# Patient Record
Sex: Female | Born: 1950 | Race: White | Hispanic: No | Smoking: Current every day smoker
Health system: Southern US, Community
[De-identification: ages and names within clinical notes are randomized; demographics above are authoritative.]

## PROBLEM LIST (undated history)

## (undated) DIAGNOSIS — E039 Hypothyroidism, unspecified: Secondary | ICD-10-CM

## (undated) DIAGNOSIS — Z87898 Personal history of other specified conditions: Secondary | ICD-10-CM

## (undated) DIAGNOSIS — Z923 Personal history of irradiation: Secondary | ICD-10-CM

## (undated) DIAGNOSIS — J449 Chronic obstructive pulmonary disease, unspecified: Secondary | ICD-10-CM

## (undated) DIAGNOSIS — I480 Paroxysmal atrial fibrillation: Secondary | ICD-10-CM

## (undated) DIAGNOSIS — G629 Polyneuropathy, unspecified: Secondary | ICD-10-CM

## (undated) DIAGNOSIS — C801 Malignant (primary) neoplasm, unspecified: Secondary | ICD-10-CM

## (undated) DIAGNOSIS — R062 Wheezing: Secondary | ICD-10-CM

## (undated) DIAGNOSIS — I2510 Atherosclerotic heart disease of native coronary artery without angina pectoris: Secondary | ICD-10-CM

## (undated) DIAGNOSIS — G2581 Restless legs syndrome: Secondary | ICD-10-CM

## (undated) DIAGNOSIS — R52 Pain, unspecified: Secondary | ICD-10-CM

## (undated) DIAGNOSIS — R0902 Hypoxemia: Secondary | ICD-10-CM

## (undated) DIAGNOSIS — R0600 Dyspnea, unspecified: Secondary | ICD-10-CM

## (undated) DIAGNOSIS — K219 Gastro-esophageal reflux disease without esophagitis: Secondary | ICD-10-CM

## (undated) DIAGNOSIS — F419 Anxiety disorder, unspecified: Secondary | ICD-10-CM

## (undated) DIAGNOSIS — I5030 Unspecified diastolic (congestive) heart failure: Secondary | ICD-10-CM

## (undated) DIAGNOSIS — I350 Nonrheumatic aortic (valve) stenosis: Secondary | ICD-10-CM

## (undated) DIAGNOSIS — G4730 Sleep apnea, unspecified: Secondary | ICD-10-CM

## (undated) DIAGNOSIS — I739 Peripheral vascular disease, unspecified: Secondary | ICD-10-CM

## (undated) DIAGNOSIS — I509 Heart failure, unspecified: Secondary | ICD-10-CM

## (undated) DIAGNOSIS — Z8719 Personal history of other diseases of the digestive system: Secondary | ICD-10-CM

## (undated) DIAGNOSIS — J45909 Unspecified asthma, uncomplicated: Secondary | ICD-10-CM

## (undated) DIAGNOSIS — E119 Type 2 diabetes mellitus without complications: Secondary | ICD-10-CM

## (undated) HISTORY — DX: Nonrheumatic aortic (valve) stenosis: I35.0

## (undated) HISTORY — DX: Paroxysmal atrial fibrillation: I48.0

## (undated) HISTORY — DX: Atherosclerotic heart disease of native coronary artery without angina pectoris: I25.10

## (undated) HISTORY — PX: FOOT SURGERY: SHX648

## (undated) HISTORY — PX: CYST EXCISION: SHX5701

## (undated) HISTORY — PX: TUBAL LIGATION: SHX77

## (undated) HISTORY — PX: BREAST SURGERY: SHX581

## (undated) HISTORY — DX: Unspecified diastolic (congestive) heart failure: I50.30

## (undated) NOTE — *Deleted (*Deleted)
PROGRESS NOTE   Kristin [name]  ZOX:[id number] DOB: 11-23-1950 DOA: 03/28/2020 PCP: Evelene Croon, MD  Brief Narrative:  33 year old white female COPD at baseline on 3 L of oxygen Continued tobacco abuse DM TY 2 OSA on CPAP HLD HFpEF with moderate aortic stenosis Patient came to [facility] developed central breast pain radiation to left arm flank Called EMS-nonspecific ST changes on EKG Troponin 25->240--- patient declined stress testing Cardiology consulted-cardiac cath performed 11/17 showed mild to moderate nonobstructive CAD with stenosis proximal left circumflex-right heart cath showed moderately elevated left-sided pressures and moderate pulmonary hypertension and patient was recommended medical therapy and outpatient TAVR eval  Assessment & Plan:   Principal Problem:   Chest pain Active Problems:   Chronic diastolic (congestive) heart failure (HCC)   Type 2 diabetes mellitus without complication (HCC)   COPD (chronic obstructive pulmonary disease) (HCC)   Obesity, Class III, BMI 40-49.9 (morbid obesity) (HCC)   Chronic respiratory failure with hypoxia (HCC)   OSA (obstructive sleep apnea)   Essential hypertension   Hypothyroidism   Elevated troponin   Severe aortic stenosis   Demand ischemia (HCC)   Acute on chronic heart failure with preserved ejection fraction (HFpEF) (HCC)   1. NSTEMI 2. Acute superimposed on chronic HFpEF-EF this admission 55-60% grade 1 diastolic dysfunction 3. Severe aortic stenosis valve area 1.11 cm gradient 31 4. Prerenal azotemia secondary to diuresis 5. DM TY 2 on oral medication 6. Moderate to severe COPD on chronic oxygen 3 L at home 7. Hypothyroidism 8. Reflux 9. Chronic constipation 10. HLD 11. Depression 12.   DVT prophylaxis:  Code Status:  Family Communication:  Disposition:  Status is: Inpatient  {Inpatient:23812}  Dispo: The patient is from: {From:23814}              Anticipated d/c is to: {To:23815}               Anticipated d/c date is: {Days:23816}              Patient currently {Medically stable:23817}       Consultants:   ***  Procedures: ***  Antimicrobials: ***    Subjective: ***  Objective: Vitals:   04/01/20 1211 04/01/20 1609 04/01/20 2044 04/02/20 0431  BP: 131/82 (!) 130/54 (!) 120/40 134/63  Pulse: 73 72 62 62  Resp: 17 20 20 18   Temp: 98.3 F (36.8 C) 98.3 F (36.8 C) 98.5 F (36.9 C) (!) 97.5 F (36.4 C)  TempSrc: Oral Oral Oral Oral  SpO2: 94% 96% 96% 96%  Weight:    106.3 kg  Height:        Intake/Output Summary (Last 24 hours) at 04/02/2020 0740 Last data filed at 04/02/2020 0500 Gross per 24 hour  Intake 1200 ml  Output 2000 ml  Net -800 ml   Filed Weights   03/31/20 1418 04/01/20 0516 04/02/20 0431  Weight: 107.2 kg 106.2 kg 106.3 kg    Examination:    Data Reviewed: I have personally reviewed following labs and imaging studies BUNs/creatinine up from 16/0.9-->34/1.06 Chloride 93 CO2 35 Hemoglobin 13.9 white count 8.9 platelet 179  Radiology Studies: CARDIAC CATHETERIZATION  Result Date: 03/31/2020  Prox RCA lesion is 30% stenosed.  Mid RCA lesion is 40% stenosed.  RPDA lesion is 30% stenosed.  Prox Cx lesion is 60% stenosed.  Mid LAD lesion is 20% stenosed.  Mid Cx to Dist Cx lesion is 20% stenosed.  1.  Mild to moderate nonobstructive coronary artery disease.  Worst stenosis  is 60% in the proximal left circumflex.  No evidence of obstructive disease.  Coronary arteries are overall moderately calcified. 2.  Right heart catheterization showed moderately elevated left-sided filling pressures, moderate pulmonary hypertension and moderately reduced cardiac output. 3.  Severe aortic stenosis with mean gradient of 31 mmHg and calculated valve area of 0.7 cm. Recommendations: The patient is significantly volume overloaded.  I switched furosemide to intravenous 20 mg twice daily.  I increased carvedilol for better blood pressure  control. Recommend medical therapy for nonobstructive coronary artery disease. Recommend outpatient TAVR evaluation.   US Carotid Bilateral  Result Date: 04/01/2020 CLINICAL DATA:  12 year old female with preoperative study EXAM: BILATERAL CAROTID DUPLEX ULTRASOUND TECHNIQUE: Wallace Cullens scale imaging, color Doppler and duplex ultrasound were performed of bilateral carotid and vertebral arteries in the neck. COMPARISON:  None. FINDINGS: Criteria: Quantification of carotid stenosis is based on velocity parameters that correlate the residual internal carotid diameter with NASCET-based stenosis levels, using the diameter of the distal internal carotid lumen as the denominator for stenosis measurement. The following velocity measurements were obtained: RIGHT ICA:  Systolic 81 cm/sec, Diastolic 19 cm/sec CCA:  70 cm/sec SYSTOLIC ICA/CCA RATIO:  1.2 ECA:  52 cm/sec LEFT ICA:  Systolic 104 cm/sec, Diastolic 23 cm/sec CCA:  91 cm/sec SYSTOLIC ICA/CCA RATIO:  1.1 ECA:  73 cm/sec Right Brachial SBP: Not acquired Left Brachial SBP: Not acquired RIGHT CAROTID ARTERY: No significant calcifications of the right common carotid artery. Intermediate waveform maintained. Heterogeneous and partially calcified plaque at the right carotid bifurcation. No significant lumen shadowing. Low resistance waveform of the right ICA. No significant tortuosity. RIGHT VERTEBRAL ARTERY: Antegrade flow with low resistance waveform. LEFT CAROTID ARTERY: No significant calcifications of the left common carotid artery. Intermediate waveform maintained. Heterogeneous and partially calcified plaque at the left carotid bifurcation without significant lumen shadowing. Low resistance waveform of the left ICA. No significant tortuosity. LEFT VERTEBRAL ARTERY:  Antegrade flow with low resistance waveform. IMPRESSION: Color duplex indicates minimal heterogeneous and calcified plaque, with no hemodynamically significant stenosis by duplex criteria in the  extracranial cerebrovascular circulation. Signed, Yvone Neu. Reyne Dumas, RPVI [facility] Radiology Electronically Signed   By: Gilmer Mor D.O.   On: 04/01/2020 10:59     Scheduled Meds: . aspirin EC  81 mg Oral Daily  . atorvastatin  80 mg Oral q1800  . carvedilol  12.5 mg Oral BID WC  . citalopram  40 mg Oral Daily  . enoxaparin (LOVENOX) injection  0.5 mg/kg Subcutaneous Q24H  . fenofibrate  160 mg Oral Daily  . furosemide  40 mg Intravenous BID  . gabapentin  600 mg Oral BID  . insulin aspart  0-20 Units Subcutaneous TID WC  . insulin aspart  0-5 Units Subcutaneous QHS  . levothyroxine  75 mcg Oral QAC breakfast  . losartan  25 mg Oral Daily  . melatonin  5 mg Oral QHS  . pantoprazole  40 mg Oral Daily  . polyethylene glycol  17 g Oral Daily  . predniSONE  40 mg Oral Q breakfast  . sodium chloride flush  3 mL Intravenous Q12H  . sodium chloride flush  3 mL Intravenous Q12H  . tiotropium  1 capsule Inhalation Daily   Continuous Infusions: . sodium chloride       LOS: 4 days    Time spent: ***  Rhetta Mura, MD [facility] To contact the attending provider between 7A-7P or the covering provider during after hours 7P-7A, please  log into the web site [email or web address] and access using universal [place] password for that web site. If you do not have the password, please call the hospital operator.  04/02/2020, 7:40 AM

---

## 2004-01-15 ENCOUNTER — Emergency Department (HOSPITAL_COMMUNITY): Admission: EM | Admit: 2004-01-15 | Discharge: 2004-01-15 | Payer: Self-pay | Admitting: *Deleted

## 2004-07-17 ENCOUNTER — Emergency Department: Payer: Self-pay | Admitting: Emergency Medicine

## 2004-10-05 ENCOUNTER — Emergency Department: Payer: Self-pay | Admitting: Emergency Medicine

## 2004-10-10 ENCOUNTER — Emergency Department: Payer: Self-pay | Admitting: Emergency Medicine

## 2005-01-10 ENCOUNTER — Emergency Department: Payer: Self-pay | Admitting: Emergency Medicine

## 2005-03-19 ENCOUNTER — Ambulatory Visit: Payer: Self-pay | Admitting: Cardiovascular Disease

## 2018-02-28 ENCOUNTER — Encounter: Payer: Self-pay | Admitting: *Deleted

## 2018-03-02 ENCOUNTER — Ambulatory Visit: Payer: Medicare Other | Admitting: Certified Registered Nurse Anesthetist

## 2018-03-02 ENCOUNTER — Other Ambulatory Visit: Payer: Self-pay

## 2018-03-02 ENCOUNTER — Ambulatory Visit
Admission: RE | Admit: 2018-03-02 | Discharge: 2018-03-02 | Disposition: A | Discharge status: Home / Self Care | Payer: Medicare Other | Source: Ambulatory Visit | Attending: Ophthalmology | Admitting: Ophthalmology

## 2018-03-02 ENCOUNTER — Encounter
Admission: RE | Disposition: A | Discharge status: Home / Self Care | Payer: Self-pay | Source: Ambulatory Visit | Attending: Ophthalmology

## 2018-03-02 ENCOUNTER — Encounter: Payer: Self-pay | Admitting: *Deleted

## 2018-03-02 DIAGNOSIS — Z79899 Other long term (current) drug therapy: Secondary | ICD-10-CM | POA: Diagnosis not present

## 2018-03-02 DIAGNOSIS — E1136 Type 2 diabetes mellitus with diabetic cataract: Secondary | ICD-10-CM | POA: Diagnosis not present

## 2018-03-02 DIAGNOSIS — Z7984 Long term (current) use of oral hypoglycemic drugs: Secondary | ICD-10-CM | POA: Diagnosis not present

## 2018-03-02 DIAGNOSIS — F419 Anxiety disorder, unspecified: Secondary | ICD-10-CM | POA: Insufficient documentation

## 2018-03-02 DIAGNOSIS — K219 Gastro-esophageal reflux disease without esophagitis: Secondary | ICD-10-CM | POA: Insufficient documentation

## 2018-03-02 DIAGNOSIS — Z7982 Long term (current) use of aspirin: Secondary | ICD-10-CM | POA: Insufficient documentation

## 2018-03-02 DIAGNOSIS — Z888 Allergy status to other drugs, medicaments and biological substances status: Secondary | ICD-10-CM | POA: Diagnosis not present

## 2018-03-02 DIAGNOSIS — E039 Hypothyroidism, unspecified: Secondary | ICD-10-CM | POA: Insufficient documentation

## 2018-03-02 DIAGNOSIS — J449 Chronic obstructive pulmonary disease, unspecified: Secondary | ICD-10-CM | POA: Insufficient documentation

## 2018-03-02 DIAGNOSIS — E1151 Type 2 diabetes mellitus with diabetic peripheral angiopathy without gangrene: Secondary | ICD-10-CM | POA: Diagnosis not present

## 2018-03-02 DIAGNOSIS — I509 Heart failure, unspecified: Secondary | ICD-10-CM | POA: Diagnosis not present

## 2018-03-02 DIAGNOSIS — Z9981 Dependence on supplemental oxygen: Secondary | ICD-10-CM | POA: Diagnosis not present

## 2018-03-02 DIAGNOSIS — Z6841 Body Mass Index (BMI) 40.0 and over, adult: Secondary | ICD-10-CM | POA: Insufficient documentation

## 2018-03-02 DIAGNOSIS — G2581 Restless legs syndrome: Secondary | ICD-10-CM | POA: Diagnosis not present

## 2018-03-02 DIAGNOSIS — E7800 Pure hypercholesterolemia, unspecified: Secondary | ICD-10-CM | POA: Diagnosis not present

## 2018-03-02 DIAGNOSIS — E669 Obesity, unspecified: Secondary | ICD-10-CM | POA: Diagnosis not present

## 2018-03-02 DIAGNOSIS — M810 Age-related osteoporosis without current pathological fracture: Secondary | ICD-10-CM | POA: Insufficient documentation

## 2018-03-02 DIAGNOSIS — H2511 Age-related nuclear cataract, right eye: Secondary | ICD-10-CM | POA: Insufficient documentation

## 2018-03-02 HISTORY — DX: Pain, unspecified: R52

## 2018-03-02 HISTORY — DX: Hypothyroidism, unspecified: E03.9

## 2018-03-02 HISTORY — DX: Dyspnea, unspecified: R06.00

## 2018-03-02 HISTORY — DX: Wheezing: R06.2

## 2018-03-02 HISTORY — DX: Chronic obstructive pulmonary disease, unspecified: J44.9

## 2018-03-02 HISTORY — DX: Personal history of other diseases of the digestive system: Z87.19

## 2018-03-02 HISTORY — DX: Polyneuropathy, unspecified: G62.9

## 2018-03-02 HISTORY — DX: Unspecified asthma, uncomplicated: J45.909

## 2018-03-02 HISTORY — DX: Peripheral vascular disease, unspecified: I73.9

## 2018-03-02 HISTORY — DX: Restless legs syndrome: G25.81

## 2018-03-02 HISTORY — DX: Anxiety disorder, unspecified: F41.9

## 2018-03-02 HISTORY — DX: Hypoxemia: R09.02

## 2018-03-02 HISTORY — PX: CATARACT EXTRACTION W/PHACO: SHX586

## 2018-03-02 HISTORY — DX: Type 2 diabetes mellitus without complications: E11.9

## 2018-03-02 HISTORY — DX: Gastro-esophageal reflux disease without esophagitis: K21.9

## 2018-03-02 HISTORY — DX: Heart failure, unspecified: I50.9

## 2018-03-02 HISTORY — DX: Personal history of other specified conditions: Z87.898

## 2018-03-02 HISTORY — DX: Sleep apnea, unspecified: G47.30

## 2018-03-02 LAB — GLUCOSE, CAPILLARY: Glucose-Capillary: 123 mg/dL — ABNORMAL HIGH (ref 70–99)

## 2018-03-02 SURGERY — PHACOEMULSIFICATION, CATARACT, WITH IOL INSERTION
Anesthesia: Monitor Anesthesia Care | Site: Eye | Laterality: Right

## 2018-03-02 MED ORDER — TETRACAINE HCL 0.5 % OP SOLN
1.0000 [drp] | OPHTHALMIC | Status: AC | PRN
  Administered 2018-03-02 (×3): 1 [drp] via OPHTHALMIC

## 2018-03-02 MED ORDER — LIDOCAINE HCL (PF) 4 % IJ SOLN
INTRAMUSCULAR | Status: AC
  Filled 2018-03-02: qty 5

## 2018-03-02 MED ORDER — ARMC OPHTHALMIC DILATING DROPS
1.0000 "application " | OPHTHALMIC | Status: AC
  Administered 2018-03-02 (×3): 1 via OPHTHALMIC

## 2018-03-02 MED ORDER — TETRACAINE HCL 0.5 % OP SOLN
OPHTHALMIC | Status: DC | PRN
  Administered 2018-03-02: 2 [drp] via OPHTHALMIC

## 2018-03-02 MED ORDER — NA HYALUR & NA CHOND-NA HYALUR 0.55-0.5 ML IO KIT
PACK | INTRAOCULAR | Status: AC
  Filled 2018-03-02: qty 1.05

## 2018-03-02 MED ORDER — POVIDONE-IODINE 5 % OP SOLN
OPHTHALMIC | Status: DC | PRN
  Administered 2018-03-02: 1 via OPHTHALMIC

## 2018-03-02 MED ORDER — MOXIFLOXACIN HCL 0.5 % OP SOLN: 1.0000 [drp] | OPHTHALMIC | Status: DC | PRN

## 2018-03-02 MED ORDER — LIDOCAINE HCL (PF) 4 % IJ SOLN
INTRAOCULAR | Status: DC | PRN
  Administered 2018-03-02: 2 mL via OPHTHALMIC

## 2018-03-02 MED ORDER — TRYPAN BLUE 0.06 % OP SOLN
OPHTHALMIC | Status: DC | PRN
  Administered 2018-03-02: .5 mL via INTRAOCULAR

## 2018-03-02 MED ORDER — EPINEPHRINE PF 1 MG/ML IJ SOLN
INTRAOCULAR | Status: DC | PRN
  Administered 2018-03-02: 1 mL via OPHTHALMIC

## 2018-03-02 MED ORDER — TETRACAINE HCL 0.5 % OP SOLN
OPHTHALMIC | Status: AC
  Administered 2018-03-02: 1 [drp] via OPHTHALMIC
  Filled 2018-03-02: qty 4

## 2018-03-02 MED ORDER — MOXIFLOXACIN HCL 0.5 % OP SOLN
OPHTHALMIC | Status: DC | PRN
  Administered 2018-03-02: .2 mL via OPHTHALMIC

## 2018-03-02 MED ORDER — POVIDONE-IODINE 5 % OP SOLN
OPHTHALMIC | Status: AC
  Filled 2018-03-02: qty 30

## 2018-03-02 MED ORDER — EPINEPHRINE PF 1 MG/ML IJ SOLN
INTRAMUSCULAR | Status: AC
  Filled 2018-03-02: qty 1

## 2018-03-02 MED ORDER — ARMC OPHTHALMIC DILATING DROPS
OPHTHALMIC | Status: AC
  Administered 2018-03-02: 1 via OPHTHALMIC
  Filled 2018-03-02: qty 0.5

## 2018-03-02 MED ORDER — MIDAZOLAM HCL 2 MG/2ML IJ SOLN
INTRAMUSCULAR | Status: DC | PRN
  Administered 2018-03-02: 0.5 mg via INTRAVENOUS
  Administered 2018-03-02: 1 mg via INTRAVENOUS
  Administered 2018-03-02: 0.5 mg via INTRAVENOUS

## 2018-03-02 MED ORDER — IPRATROPIUM-ALBUTEROL 0.5-2.5 (3) MG/3ML IN SOLN
3.0000 mL | Freq: Once | RESPIRATORY_TRACT | Status: AC
  Administered 2018-03-02: 3 mL via RESPIRATORY_TRACT

## 2018-03-02 MED ORDER — TRYPAN BLUE 0.06 % OP SOLN
OPHTHALMIC | Status: AC
  Filled 2018-03-02: qty 0.5

## 2018-03-02 MED ORDER — SODIUM CHLORIDE 0.9 % IV SOLN
INTRAVENOUS | Status: DC
  Administered 2018-03-02: 09:00:00 via INTRAVENOUS

## 2018-03-02 MED ORDER — MIDAZOLAM HCL 2 MG/2ML IJ SOLN
INTRAMUSCULAR | Status: AC
  Filled 2018-03-02: qty 2

## 2018-03-02 MED ORDER — MOXIFLOXACIN HCL 0.5 % OP SOLN
OPHTHALMIC | Status: AC
  Filled 2018-03-02: qty 3

## 2018-03-02 MED ORDER — IPRATROPIUM-ALBUTEROL 0.5-2.5 (3) MG/3ML IN SOLN
RESPIRATORY_TRACT | Status: AC
  Administered 2018-03-02: 3 mL via RESPIRATORY_TRACT
  Filled 2018-03-02: qty 3

## 2018-03-02 MED ORDER — IPRATROPIUM-ALBUTEROL 0.5-2.5 (3) MG/3ML IN SOLN: 3.0000 mL | Freq: Four times a day (QID) | RESPIRATORY_TRACT | Status: DC

## 2018-03-02 SURGICAL SUPPLY — 19 items
DISSECTOR HYDRO NUCLEUS 50X22 (MISCELLANEOUS) ×6 IMPLANT
GLOVE BIOGEL M 6.5 STRL (GLOVE) ×2 IMPLANT
GOWN STRL REUS W/ TWL LRG LVL3 (GOWN DISPOSABLE) ×1 IMPLANT
GOWN STRL REUS W/ TWL XL LVL3 (GOWN DISPOSABLE) ×1 IMPLANT
GOWN STRL REUS W/TWL LRG LVL3 (GOWN DISPOSABLE) ×1
GOWN STRL REUS W/TWL XL LVL3 (GOWN DISPOSABLE) ×1
KNIFE 45D UP 2.3 (MISCELLANEOUS) ×2 IMPLANT
LABEL CATARACT MEDS ST (LABEL) ×2 IMPLANT
LENS IOL ACRSF IQ ULTRA 23.0 (Intraocular Lens) IMPLANT
LENS IOL ACRYSOF IQ 23.0 (Intraocular Lens) ×2 IMPLANT
PACK CATARACT (MISCELLANEOUS) ×2 IMPLANT
PACK CATARACT KING (MISCELLANEOUS) ×2 IMPLANT
PACK EYE AFTER SURG (MISCELLANEOUS) ×2 IMPLANT
SOL BSS BAG (MISCELLANEOUS) ×2
SOLUTION BSS BAG (MISCELLANEOUS) ×1 IMPLANT
SPEAR PVA EYE SURG (MISCELLANEOUS) ×1 IMPLANT
SYR 5ML LL (SYRINGE) ×2 IMPLANT
WATER STERILE IRR 250ML POUR (IV SOLUTION) ×2 IMPLANT
WIPE NON LINTING 3.25X3.25 (MISCELLANEOUS) ×2 IMPLANT

## 2018-03-02 NOTE — Transfer of Care (Signed)
Immediate Anesthesia Transfer of Care Note  Patient: Kristin [name]  Procedure(s) Performed: CATARACT EXTRACTION PHACO AND INTRAOCULAR LENS PLACEMENT (IOC) (Right Eye)  Patient Location: PACU  Anesthesia Type:MAC  Level of Consciousness: awake, alert  and oriented  Airway & Oxygen Therapy: Patient Spontanous Breathing  Post-op Assessment: Report given to RN and Post -op Vital signs reviewed and stable  Post vital signs: Reviewed and stable  Last Vitals:  Vitals Value Taken Time  BP 140/67   Temp    Pulse 88   Resp 24   SpO2 95     Last Pain:  Vitals:   03/02/18 0832  TempSrc: Tympanic  PainSc: 6          Complications: No apparent anesthesia complications

## 2018-03-02 NOTE — H&P (Signed)
  The H&P has been reviewed, and I agree with its findins. There have been no interval changes.  Marchia Meiers MD

## 2018-03-02 NOTE — Anesthesia Preprocedure Evaluation (Signed)
Anesthesia Evaluation  Patient identified by MRN, date of birth, ID band Patient awake    Reviewed: Allergy & Precautions, NPO status , Patient's Chart, lab work & pertinent test results  History of Anesthesia Complications Negative for: history of anesthetic complications  Airway Mallampati: III  TM Distance: >3 FB Neck ROM: Full    Dental  (+) Edentulous Upper, Edentulous Lower   Pulmonary asthma , sleep apnea , COPD,  COPD inhaler and oxygen dependent, Current Smoker,    breath sounds clear to auscultation- rhonchi (-) wheezing      Cardiovascular +CHF and + Orthopnea  (-) CAD, (-) Past MI, (-) Cardiac Stents and (-) CABG  Rhythm:Regular Rate:Normal - Systolic murmurs and - Diastolic murmurs    Neuro/Psych Anxiety negative neurological ROS     GI/Hepatic Neg liver ROS, hiatal hernia, GERD  ,  Endo/Other  diabetes, Oral Hypoglycemic AgentsHypothyroidism   Renal/GU negative Renal ROS     Musculoskeletal negative musculoskeletal ROS (+)   Abdominal (+) + obese,   Peds  Hematology negative hematology ROS (+)   Anesthesia Other Findings Past Medical History: No date: Anxiety No date: Asthma No date: CHF (congestive heart failure) (HCC)     Comment:  2018 No date: COPD (chronic obstructive pulmonary disease) (HCC) No date: Diabetes mellitus without complication (HCC) No date: Dyspnea No date: GERD (gastroesophageal reflux disease) No date: History of hiatal hernia No date: History of orthopnea No date: Hypothyroidism No date: Neuropathy No date: Oxygen deficiency     Comment:  3L/HS No date: Pain     Comment:  BACK/DDD No date: Peripheral vascular disease (HCC) No date: RLS (restless legs syndrome) No date: Sleep apnea No date: Wheezing   Reproductive/Obstetrics                             Anesthesia Physical Anesthesia Plan  ASA: III  Anesthesia Plan: MAC   Post-op Pain  Management:    Induction: Intravenous  PONV Risk Score and Plan: 2 and Midazolam  Airway Management Planned: Natural Airway  Additional Equipment:   Intra-op Plan:   Post-operative Plan:   Informed Consent: I have reviewed the patients History and Physical, chart, labs and discussed the procedure including the risks, benefits and alternatives for the proposed anesthesia with the patient or authorized representative who has indicated his/her understanding and acceptance.     Plan Discussed with: CRNA and Anesthesiologist  Anesthesia Plan Comments:         Anesthesia Quick Evaluation

## 2018-03-02 NOTE — Anesthesia Procedure Notes (Signed)
Procedure Name: MAC Performed by: Shenekia Riess, CRNA Pre-anesthesia Checklist: Patient identified, Emergency Drugs available, Suction available, Patient being monitored and Timeout performed Oxygen Delivery Method: Nasal cannula       

## 2018-03-02 NOTE — Op Note (Signed)
  PREOPERATIVE DIAGNOSIS:  Nuclear sclerotic cataract of the right eye.   POSTOPERATIVE DIAGNOSIS:  nuclear sclerotic cataract right eye   OPERATIVE PROCEDURE: Procedure(s): CATARACT EXTRACTION PHACO AND INTRAOCULAR LENS PLACEMENT (IOC)   SURGEON:  Marchia Meiers, MD.   ANESTHESIA:  Anesthesiologist: Emmie Niemann, MD CRNA: Demetrius Charity, CRNA  1.      Managed anesthesia care. 2.      0.32ml of Shugarcaine was instilled in the eye following the paracentesis.   COMPLICATIONS:  None.   TECHNIQUE:   Divide and conquer   DESCRIPTION OF PROCEDURE:  The patient was examined and consented in the preoperative holding area where the aforementioned topical anesthesia was applied to the right eye and then brought back to the Operating Room where the right eye was prepped and draped in the usual sterile ophthalmic fashion and a lid speculum was placed. A paracentesis was created with the side port blade and the anterior chamber was filled with viscoelastic after staining the capsule with trypan blue. A near clear corneal incision was performed with the steel keratome. A continuous curvilinear capsulorrhexis was performed with a cystotome followed by the capsulorrhexis forceps. Hydrodissection and hydrodelineation were carried out with BSS on a blunt cannula. The lens was removed and the remaining cortical material was removed with the irrigation-aspiration handpiece. The capsular bag was inflated with viscoelastic and the  lens was placed in the capsular bag without complication. The remaining viscoelastic was removed from the eye with the irrigation-aspiration handpiece. The wounds were hydrated. The anterior chamber was flushed and the eye was inflated to physiologic pressure. 0.6ml of Vigamox was placed in the anterior chamber. The wounds were found to be water tight. The eye was dressed with Vigamox. The patient was given protective glasses to wear throughout the day and a shield with which to sleep  tonight. The patient was also given drops with which to begin a drop regimen today and will follow-up with me in one day. Implant Name Type Inv. Item Serial No. Manufacturer Lot No. LRB No. Used  LENS IOL ACRYSOF IQ 23.0 - [id number] Intraocular Lens LENS IOL ACRYSOF IQ 23.0 [id number] ALCON  Right 1  LENS IOL ACRYSOF IQ 23.0 - [id number] Intraocular Lens LENS IOL ACRYSOF IQ 23.0 [id number] ALCON  Right 1   Procedure(s) with comments: CATARACT EXTRACTION PHACO AND INTRAOCULAR LENS PLACEMENT (IOC) (Right) - [place] 01:15 CDE 16.46 Fluid pack lot # [id number]  Electronically signed: Vasilia Dise 03/02/2018 10:50 AM

## 2018-03-02 NOTE — Anesthesia Postprocedure Evaluation (Signed)
Anesthesia Post Note  Patient: Kristin [name]  Procedure(s) Performed: CATARACT EXTRACTION PHACO AND INTRAOCULAR LENS PLACEMENT (IOC) (Right Eye)  Patient location during evaluation: Short Stay Anesthesia Type: MAC Level of consciousness: awake and alert and oriented Pain management: satisfactory to patient Vital Signs Assessment: post-procedure vital signs reviewed and stable Respiratory status: respiratory function stable Cardiovascular status: stable Anesthetic complications: no     Last Vitals:  Vitals:   03/02/18 0832 03/02/18 1051  BP: (!) 167/76 140/67  Pulse: 92 90  Resp: 20 18  Temp: (!) 35.6 C 36.4 C  SpO2: 91% 95%    Last Pain:  Vitals:   03/02/18 1051  TempSrc: Tympanic  PainSc: 0-No pain                 Blima Singer

## 2018-03-02 NOTE — Anesthesia Post-op Follow-up Note (Signed)
Anesthesia QCDR form completed.        

## 2018-03-02 NOTE — Discharge Instructions (Addendum)
Eye Surgery Discharge Instructions  Expect mild scratchy sensation or mild soreness. DO NOT RUB YOUR EYE!  The day of surgery:  Minimal physical activity, but bed rest is not required  No reading, computer work, or close hand work  No bending, lifting, or straining.  May watch TV  For 24 hours:  No driving, legal decisions, or alcoholic beverages  Safety precautions  Eat anything you prefer: It is better to start with liquids, then soup then solid foods.  Solar shield eyeglasses should be worn for comfort in the sunlight/patch while sleeping  Resume all regular medications including aspirin or Coumadin if these were discontinued prior to surgery. You may shower, bathe, shave, or wash your hair. Tylenol may be taken for mild discomfort. FOLLOW EYE DROP INSTRUCTION SHEET AS REVIEWED.  Call your doctor if you experience significant pain, nausea, or vomiting, fever > 101 or other signs of infection. [phone] or [phone] Specific instructions:  Follow-up Information    Marchia Meiers, MD Follow up.   Specialty:  Ophthalmology Why:  03/03/18 @ 9:40 am Contact information: [place] [address] [phone]

## 2018-03-03 ENCOUNTER — Encounter: Payer: Self-pay | Admitting: Ophthalmology

## 2018-03-27 ENCOUNTER — Encounter: Payer: Self-pay | Admitting: *Deleted

## 2018-03-30 ENCOUNTER — Ambulatory Visit: Admission: RE | Admit: 2018-03-30 | Payer: Medicare Other | Source: Ambulatory Visit

## 2018-04-24 ENCOUNTER — Encounter: Payer: Self-pay | Admitting: *Deleted

## 2018-04-27 ENCOUNTER — Ambulatory Visit: Payer: Medicare Other | Admitting: Certified Registered"

## 2018-04-27 ENCOUNTER — Ambulatory Visit
Admission: RE | Admit: 2018-04-27 | Discharge: 2018-04-27 | Disposition: A | Discharge status: Home / Self Care | Payer: Medicare Other | Attending: Ophthalmology | Admitting: Ophthalmology

## 2018-04-27 ENCOUNTER — Encounter: Payer: Self-pay | Admitting: *Deleted

## 2018-04-27 ENCOUNTER — Encounter: Admission: RE | Payer: Self-pay | Source: Ambulatory Visit

## 2018-04-27 ENCOUNTER — Encounter
Admission: RE | Disposition: A | Discharge status: Home / Self Care | Payer: Self-pay | Source: Home / Self Care | Attending: Ophthalmology

## 2018-04-27 ENCOUNTER — Other Ambulatory Visit: Payer: Self-pay

## 2018-04-27 DIAGNOSIS — E7800 Pure hypercholesterolemia, unspecified: Secondary | ICD-10-CM | POA: Diagnosis not present

## 2018-04-27 DIAGNOSIS — E119 Type 2 diabetes mellitus without complications: Secondary | ICD-10-CM | POA: Diagnosis not present

## 2018-04-27 DIAGNOSIS — F419 Anxiety disorder, unspecified: Secondary | ICD-10-CM | POA: Insufficient documentation

## 2018-04-27 DIAGNOSIS — J449 Chronic obstructive pulmonary disease, unspecified: Secondary | ICD-10-CM | POA: Diagnosis not present

## 2018-04-27 DIAGNOSIS — H2512 Age-related nuclear cataract, left eye: Secondary | ICD-10-CM | POA: Diagnosis present

## 2018-04-27 DIAGNOSIS — Z9981 Dependence on supplemental oxygen: Secondary | ICD-10-CM | POA: Insufficient documentation

## 2018-04-27 DIAGNOSIS — G2581 Restless legs syndrome: Secondary | ICD-10-CM | POA: Diagnosis not present

## 2018-04-27 DIAGNOSIS — Z7984 Long term (current) use of oral hypoglycemic drugs: Secondary | ICD-10-CM | POA: Insufficient documentation

## 2018-04-27 DIAGNOSIS — Z79899 Other long term (current) drug therapy: Secondary | ICD-10-CM | POA: Insufficient documentation

## 2018-04-27 DIAGNOSIS — F17200 Nicotine dependence, unspecified, uncomplicated: Secondary | ICD-10-CM | POA: Insufficient documentation

## 2018-04-27 DIAGNOSIS — I509 Heart failure, unspecified: Secondary | ICD-10-CM | POA: Diagnosis not present

## 2018-04-27 DIAGNOSIS — K219 Gastro-esophageal reflux disease without esophagitis: Secondary | ICD-10-CM | POA: Diagnosis not present

## 2018-04-27 DIAGNOSIS — Z7982 Long term (current) use of aspirin: Secondary | ICD-10-CM | POA: Diagnosis not present

## 2018-04-27 DIAGNOSIS — I110 Hypertensive heart disease with heart failure: Secondary | ICD-10-CM | POA: Diagnosis not present

## 2018-04-27 HISTORY — PX: CATARACT EXTRACTION W/PHACO: SHX586

## 2018-04-27 LAB — GLUCOSE, CAPILLARY: Glucose-Capillary: 149 mg/dL — ABNORMAL HIGH (ref 70–99)

## 2018-04-27 SURGERY — PHACOEMULSIFICATION, CATARACT, WITH IOL INSERTION
Anesthesia: Monitor Anesthesia Care | Site: Eye | Laterality: Left

## 2018-04-27 SURGERY — PHACOEMULSIFICATION, CATARACT, WITH IOL INSERTION
Anesthesia: Choice | Laterality: Left

## 2018-04-27 MED ORDER — ONDANSETRON HCL 4 MG/2ML IJ SOLN: 4.0000 mg | Freq: Once | INTRAMUSCULAR | Status: DC | PRN

## 2018-04-27 MED ORDER — ARMC OPHTHALMIC DILATING DROPS: 1.0000 "application " | OPHTHALMIC | Status: AC

## 2018-04-27 MED ORDER — MIDAZOLAM HCL 2 MG/2ML IJ SOLN
INTRAMUSCULAR | Status: DC | PRN
  Administered 2018-04-27: 1 mg via INTRAVENOUS

## 2018-04-27 MED ORDER — NA CHONDROIT SULF-NA HYALURON 40-17 MG/ML IO SOLN
INTRAOCULAR | Status: DC | PRN
  Administered 2018-04-27 (×2): 1 mL via INTRAOCULAR

## 2018-04-27 MED ORDER — FENTANYL CITRATE (PF) 100 MCG/2ML IJ SOLN: 25.0000 ug | INTRAMUSCULAR | Status: DC | PRN

## 2018-04-27 MED ORDER — EPINEPHRINE PF 1 MG/ML IJ SOLN
INTRAOCULAR | Status: DC | PRN
  Administered 2018-04-27: 200 mL via OPHTHALMIC

## 2018-04-27 MED ORDER — LIDOCAINE HCL (PF) 4 % IJ SOLN
INTRAMUSCULAR | Status: AC
  Filled 2018-04-27: qty 5

## 2018-04-27 MED ORDER — ARMC OPHTHALMIC DILATING DROPS
OPHTHALMIC | Status: AC
  Administered 2018-04-27: 1 via OPHTHALMIC
  Filled 2018-04-27: qty 0.5

## 2018-04-27 MED ORDER — MOXIFLOXACIN HCL 0.5 % OP SOLN
OPHTHALMIC | Status: DC | PRN
  Administered 2018-04-27: 0.2 mL via OPHTHALMIC

## 2018-04-27 MED ORDER — TETRACAINE HCL 0.5 % OP SOLN
OPHTHALMIC | Status: AC
  Administered 2018-04-27: 1 [drp] via OPHTHALMIC
  Filled 2018-04-27: qty 4

## 2018-04-27 MED ORDER — SODIUM CHLORIDE 0.9 % IV SOLN: INTRAVENOUS | Status: DC

## 2018-04-27 MED ORDER — POVIDONE-IODINE 5 % OP SOLN
OPHTHALMIC | Status: AC
  Filled 2018-04-27: qty 30

## 2018-04-27 MED ORDER — TETRACAINE HCL 0.5 % OP SOLN
1.0000 [drp] | OPHTHALMIC | Status: AC | PRN
  Administered 2018-04-27 (×3): 1 [drp] via OPHTHALMIC

## 2018-04-27 MED ORDER — MANNITOL 25% IV SOLUTION 12.5G/50ML SYRINGE
12.5000 g | Freq: Once | INTRAVENOUS | Status: DC
  Filled 2018-04-27: qty 50

## 2018-04-27 MED ORDER — TRYPAN BLUE 0.06 % OP SOLN
OPHTHALMIC | Status: AC
  Filled 2018-04-27: qty 0.5

## 2018-04-27 MED ORDER — MIDAZOLAM HCL 2 MG/2ML IJ SOLN
INTRAMUSCULAR | Status: AC
  Filled 2018-04-27: qty 2

## 2018-04-27 MED ORDER — MOXIFLOXACIN HCL 0.5 % OP SOLN
OPHTHALMIC | Status: AC
  Filled 2018-04-27: qty 3

## 2018-04-27 MED ORDER — EPINEPHRINE PF 1 MG/ML IJ SOLN
INTRAMUSCULAR | Status: AC
  Filled 2018-04-27: qty 1

## 2018-04-27 MED ORDER — CARBACHOL 0.01 % IO SOLN
INTRAOCULAR | Status: DC | PRN
  Administered 2018-04-27: 0.5 mL via INTRAOCULAR

## 2018-04-27 MED ORDER — LIDOCAINE HCL (PF) 4 % IJ SOLN
INTRAOCULAR | Status: DC | PRN
  Administered 2018-04-27: 4 mL via OPHTHALMIC

## 2018-04-27 MED ORDER — TRYPAN BLUE 0.06 % OP SOLN
OPHTHALMIC | Status: DC | PRN
  Administered 2018-04-27: 0.5 mL via INTRAOCULAR

## 2018-04-27 MED ORDER — POVIDONE-IODINE 5 % OP SOLN
OPHTHALMIC | Status: DC | PRN
  Administered 2018-04-27: 1 via OPHTHALMIC

## 2018-04-27 MED ORDER — ARMC OPHTHALMIC DILATING DROPS
1.0000 "application " | OPHTHALMIC | Status: AC
  Administered 2018-04-27 (×3): 1 via OPHTHALMIC

## 2018-04-27 MED ORDER — NA CHONDROIT SULF-NA HYALURON 40-17 MG/ML IO SOLN
INTRAOCULAR | Status: AC
  Filled 2018-04-27: qty 3

## 2018-04-27 MED ORDER — MANNITOL 25 % IV SOLN
INTRAVENOUS | Status: AC
  Filled 2018-04-27: qty 50

## 2018-04-27 MED ORDER — MOXIFLOXACIN HCL 0.5 % OP SOLN: 1.0000 [drp] | OPHTHALMIC | Status: DC | PRN

## 2018-04-27 SURGICAL SUPPLY — 17 items
DISSECTOR HYDRO NUCLEUS 50X22 (MISCELLANEOUS) ×8 IMPLANT
GLOVE BIOGEL M 6.5 STRL (GLOVE) ×2 IMPLANT
GOWN STRL REUS W/ TWL LRG LVL3 (GOWN DISPOSABLE) ×1 IMPLANT
GOWN STRL REUS W/ TWL XL LVL3 (GOWN DISPOSABLE) ×1 IMPLANT
GOWN STRL REUS W/TWL LRG LVL3 (GOWN DISPOSABLE) ×1
GOWN STRL REUS W/TWL XL LVL3 (GOWN DISPOSABLE) ×1
KNIFE 45D UP 2.3 (MISCELLANEOUS) ×2 IMPLANT
LABEL CATARACT MEDS ST (LABEL) ×2 IMPLANT
LENS IOL ACRYSOF IQ 22.5 (Intraocular Lens) ×2 IMPLANT
PACK CATARACT (MISCELLANEOUS) ×2 IMPLANT
PACK CATARACT KING (MISCELLANEOUS) ×2 IMPLANT
PACK EYE AFTER SURG (MISCELLANEOUS) ×2 IMPLANT
SOL BSS BAG (MISCELLANEOUS) ×2
SOLUTION BSS BAG (MISCELLANEOUS) ×1 IMPLANT
SYR 5ML LL (SYRINGE) ×2 IMPLANT
WATER STERILE IRR 250ML POUR (IV SOLUTION) ×2 IMPLANT
WIPE NON LINTING 3.25X3.25 (MISCELLANEOUS) ×2 IMPLANT

## 2018-04-27 NOTE — H&P (Signed)
   I have reviewed the patient's H&P and agree with its findings. There have been no interval changes.  Moana Munford MD Ophthalmology 

## 2018-04-27 NOTE — Transfer of Care (Signed)
Immediate Anesthesia Transfer of Care Note  Patient: Kristin [name]  Procedure(s) Performed: CATARACT EXTRACTION PHACO AND INTRAOCULAR LENS PLACEMENT (IOC)- LEFT DIABETIC (Left Eye)  Patient Location: PACU  Anesthesia Type:MAC  Level of Consciousness: awake, alert  and oriented  Airway & Oxygen Therapy: Patient Spontanous Breathing  Post-op Assessment: Report given to RN and Post -op Vital signs reviewed and stable  Post vital signs: Reviewed and stable  Last Vitals:  Vitals Value Taken Time  BP    Temp    Pulse    Resp    SpO2      Last Pain:  Vitals:   04/27/18 0731  TempSrc: Temporal         Complications: No apparent anesthesia complications

## 2018-04-27 NOTE — Discharge Instructions (Signed)
Eye Surgery Discharge Instructions    Expect mild scratchy sensation or mild soreness. DO NOT RUB YOUR EYE!  The day of surgery:  Minimal physical activity, but bed rest is not required  No reading, computer work, or close hand work  No bending, lifting, or straining.  May watch TV  For 24 hours:  No driving, legal decisions, or alcoholic beverages  Safety precautions  Eat anything you prefer: It is better to start with liquids, then soup then solid foods.  _____ Eye patch should be worn until postoperative exam tomorrow.  ____ Solar shield eyeglasses should be worn for comfort in the sunlight/patch while sleeping  Resume all regular medications including aspirin or Coumadin if these were discontinued prior to surgery. You may shower, bathe, shave, or wash your hair. Tylenol may be taken for mild discomfort.  Call your doctor if you experience significant pain, nausea, or vomiting, fever > 101 or other signs of infection. [phone] or [phone] Specific instructions:  Follow-up Information    Marchia Meiers, MD Follow up on 04/28/2018.   Specialty:  Ophthalmology Why:  9:40 Contact information: [address] [place] [address] [phone]

## 2018-04-27 NOTE — Anesthesia Postprocedure Evaluation (Signed)
Anesthesia Post Note  Patient: Evoleht A Nazario  Procedure(s) Performed: CATARACT EXTRACTION PHACO AND INTRAOCULAR LENS PLACEMENT (IOC)- LEFT DIABETIC (Left Eye)  Patient location during evaluation: PACU Anesthesia Type: MAC Level of consciousness: awake and alert Pain management: pain level controlled Vital Signs Assessment: post-procedure vital signs reviewed and stable Respiratory status: spontaneous breathing, nonlabored ventilation, respiratory function stable and patient connected to nasal cannula oxygen Cardiovascular status: stable and blood pressure returned to baseline Postop Assessment: no apparent nausea or vomiting Anesthetic complications: no     Last Vitals:  Vitals:   04/27/18 0731  BP: (!) 170/94  Pulse: 98  Resp: 16  Temp: (!) 36.3 C  SpO2: 98%    Last Pain:  Vitals:   04/27/18 0731  TempSrc: Temporal                 Einar Grad Tajay Muzzy

## 2018-04-27 NOTE — Anesthesia Post-op Follow-up Note (Signed)
Anesthesia QCDR form completed.        

## 2018-04-27 NOTE — Op Note (Signed)
  PREOPERATIVE DIAGNOSIS:  Nuclear sclerotic cataract of the LEFT eye.   POSTOPERATIVE DIAGNOSIS:  Nuclear sclerotic cataract of the LEFT eye.   OPERATIVE PROCEDURE: Cataract surgery OS   SURGEON:  Marchia Meiers, MD.   ANESTHESIA:  Anesthesiologist: Molli Barrows, MD CRNA: Disser, Einar Grad, CRNA  1.      Managed anesthesia care. 2.     0.54ml of Shugarcaine was instilled following the paracentesis   COMPLICATIONS:  None.   TECHNIQUE:   Divide and conquer   DESCRIPTION OF PROCEDURE:  The patient was examined and consented in the preoperative holding area where the aforementioned topical anesthesia was applied to the LEFT eye and then brought back to the Operating Room where the left eye was prepped and draped in the usual sterile ophthalmic fashion and a lid speculum was placed. A paracentesis was created with the side port blade, the anterior chamber was washed out with trypan blue to stain the anterior capsule, and the anterior chamber was filled with viscoelastic. A near clear corneal incision was performed with the steel keratome. A continuous curvilinear capsulorrhexis was performed with a cystotome followed by the capsulorrhexis forceps. Hydrodissection and hydrodelineation were carried out with BSS on a blunt cannula. The lens was removed in a divide and conquer  technique and the remaining cortical material was removed with the irrigation-aspiration handpiece. The capsular bag was inflated with viscoelastic and the lens was placed in the capsular bag without complication. The remaining viscoelastic was removed from the eye with the irrigation-aspiration handpiece. The wounds were hydrated. The anterior chamber was flushed and the eye was inflated to physiologic pressure. 0.71ml Vigamox was placed in the anterior chamber. The wounds were found to be water tight. The eye was dressed with Vigamox. The patient was given protective glasses to wear throughout the day and a shield with which to  sleep tonight. The patient was also given drops with which to begin a drop regimen today and will follow-up with me in one day. Implant Name Type Inv. Item Serial No. Manufacturer Lot No. LRB No. Used  LENS IOL ACRYSOF IQ 22.5 - [id number] Intraocular Lens LENS IOL ACRYSOF IQ 22.5 [id number] ALCON  Left 1    Procedure(s) with comments: CATARACT EXTRACTION PHACO AND INTRAOCULAR LENS PLACEMENT ([place])- LEFT DIABETIC (Left) - Lot # [id number] H [place]: 00:46.3 CDE: 8.07   Electronically signed: Marchia Meiers 04/27/2018 9:28 AM

## 2018-04-27 NOTE — Anesthesia Preprocedure Evaluation (Signed)
Anesthesia Evaluation  Patient identified by MRN, date of birth, ID band Patient awake    Reviewed: Allergy & Precautions, H&P , NPO status , Patient's Chart, lab work & pertinent test results, reviewed documented beta blocker date and time   Airway Mallampati: II  TM Distance: >3 FB Neck ROM: full    Dental no notable dental hx. (+) Teeth Intact   Pulmonary neg pulmonary ROS, shortness of breath, asthma , sleep apnea , COPD, Current Smoker,    Pulmonary exam normal breath sounds clear to auscultation       Cardiovascular Exercise Tolerance: Poor hypertension, + Peripheral Vascular Disease, +CHF and + Orthopnea  negative cardio ROS   Rhythm:regular Rate:Normal     Neuro/Psych Anxiety negative neurological ROS  negative psych ROS   GI/Hepatic negative GI ROS, Neg liver ROS, hiatal hernia, GERD  ,  Endo/Other  negative endocrine ROSdiabetesHypothyroidism   Renal/GU      Musculoskeletal   Abdominal   Peds  Hematology negative hematology ROS (+)   Anesthesia Other Findings   Reproductive/Obstetrics negative OB ROS                             Anesthesia Physical Anesthesia Plan  ASA: IV  Anesthesia Plan: MAC   Post-op Pain Management:    Induction:   PONV Risk Score and Plan:   Airway Management Planned:   Additional Equipment:   Intra-op Plan:   Post-operative Plan:   Informed Consent: I have reviewed the patients History and Physical, chart, labs and discussed the procedure including the risks, benefits and alternatives for the proposed anesthesia with the patient or authorized representative who has indicated his/her understanding and acceptance.     Plan Discussed with: CRNA  Anesthesia Plan Comments:         Anesthesia Quick Evaluation

## 2018-04-28 ENCOUNTER — Encounter: Payer: Self-pay | Admitting: Ophthalmology

## 2018-07-04 ENCOUNTER — Other Ambulatory Visit: Payer: Self-pay

## 2018-07-04 ENCOUNTER — Emergency Department: Payer: Medicare Other

## 2018-07-04 ENCOUNTER — Inpatient Hospital Stay
Admission: EM | Admit: 2018-07-04 | Discharge: 2018-07-07 | DRG: 190 | Disposition: A | Discharge status: Home Health | Payer: Medicare Other | Attending: Internal Medicine | Admitting: Internal Medicine

## 2018-07-04 DIAGNOSIS — F17200 Nicotine dependence, unspecified, uncomplicated: Secondary | ICD-10-CM | POA: Diagnosis present

## 2018-07-04 DIAGNOSIS — F419 Anxiety disorder, unspecified: Secondary | ICD-10-CM | POA: Diagnosis present

## 2018-07-04 DIAGNOSIS — E785 Hyperlipidemia, unspecified: Secondary | ICD-10-CM | POA: Diagnosis present

## 2018-07-04 DIAGNOSIS — Z79891 Long term (current) use of opiate analgesic: Secondary | ICD-10-CM | POA: Diagnosis not present

## 2018-07-04 DIAGNOSIS — Z6841 Body Mass Index (BMI) 40.0 and over, adult: Secondary | ICD-10-CM

## 2018-07-04 DIAGNOSIS — Z7982 Long term (current) use of aspirin: Secondary | ICD-10-CM

## 2018-07-04 DIAGNOSIS — I5031 Acute diastolic (congestive) heart failure: Secondary | ICD-10-CM | POA: Diagnosis present

## 2018-07-04 DIAGNOSIS — Z9981 Dependence on supplemental oxygen: Secondary | ICD-10-CM

## 2018-07-04 DIAGNOSIS — Z9119 Patient's noncompliance with other medical treatment and regimen: Secondary | ICD-10-CM

## 2018-07-04 DIAGNOSIS — Z9841 Cataract extraction status, right eye: Secondary | ICD-10-CM | POA: Diagnosis not present

## 2018-07-04 DIAGNOSIS — R05 Cough: Secondary | ICD-10-CM | POA: Diagnosis present

## 2018-07-04 DIAGNOSIS — I509 Heart failure, unspecified: Secondary | ICD-10-CM

## 2018-07-04 DIAGNOSIS — Z79899 Other long term (current) drug therapy: Secondary | ICD-10-CM

## 2018-07-04 DIAGNOSIS — Z7984 Long term (current) use of oral hypoglycemic drugs: Secondary | ICD-10-CM | POA: Diagnosis not present

## 2018-07-04 DIAGNOSIS — Z888 Allergy status to other drugs, medicaments and biological substances status: Secondary | ICD-10-CM

## 2018-07-04 DIAGNOSIS — Z9842 Cataract extraction status, left eye: Secondary | ICD-10-CM

## 2018-07-04 DIAGNOSIS — E039 Hypothyroidism, unspecified: Secondary | ICD-10-CM | POA: Diagnosis present

## 2018-07-04 DIAGNOSIS — G2581 Restless legs syndrome: Secondary | ICD-10-CM | POA: Diagnosis present

## 2018-07-04 DIAGNOSIS — G4733 Obstructive sleep apnea (adult) (pediatric): Secondary | ICD-10-CM | POA: Diagnosis present

## 2018-07-04 DIAGNOSIS — E1151 Type 2 diabetes mellitus with diabetic peripheral angiopathy without gangrene: Secondary | ICD-10-CM | POA: Diagnosis present

## 2018-07-04 DIAGNOSIS — Z8249 Family history of ischemic heart disease and other diseases of the circulatory system: Secondary | ICD-10-CM

## 2018-07-04 DIAGNOSIS — R0689 Other abnormalities of breathing: Secondary | ICD-10-CM

## 2018-07-04 DIAGNOSIS — Z961 Presence of intraocular lens: Secondary | ICD-10-CM | POA: Diagnosis present

## 2018-07-04 DIAGNOSIS — Z720 Tobacco use: Secondary | ICD-10-CM

## 2018-07-04 DIAGNOSIS — J9621 Acute and chronic respiratory failure with hypoxia: Secondary | ICD-10-CM | POA: Diagnosis present

## 2018-07-04 DIAGNOSIS — Z79890 Hormone replacement therapy (postmenopausal): Secondary | ICD-10-CM | POA: Diagnosis not present

## 2018-07-04 DIAGNOSIS — J9612 Chronic respiratory failure with hypercapnia: Secondary | ICD-10-CM | POA: Diagnosis present

## 2018-07-04 DIAGNOSIS — R059 Cough, unspecified: Secondary | ICD-10-CM

## 2018-07-04 DIAGNOSIS — J9611 Chronic respiratory failure with hypoxia: Secondary | ICD-10-CM | POA: Diagnosis present

## 2018-07-04 DIAGNOSIS — J9690 Respiratory failure, unspecified, unspecified whether with hypoxia or hypercapnia: Secondary | ICD-10-CM | POA: Diagnosis present

## 2018-07-04 DIAGNOSIS — K219 Gastro-esophageal reflux disease without esophagitis: Secondary | ICD-10-CM | POA: Diagnosis present

## 2018-07-04 DIAGNOSIS — J441 Chronic obstructive pulmonary disease with (acute) exacerbation: Secondary | ICD-10-CM | POA: Diagnosis present

## 2018-07-04 DIAGNOSIS — R9431 Abnormal electrocardiogram [ECG] [EKG]: Secondary | ICD-10-CM | POA: Diagnosis not present

## 2018-07-04 DIAGNOSIS — R6889 Other general symptoms and signs: Secondary | ICD-10-CM

## 2018-07-04 LAB — BLOOD GAS, VENOUS
Acid-Base Excess: 9.6 mmol/L — ABNORMAL HIGH (ref 0.0–2.0)
Bicarbonate: 38.5 mmol/L — ABNORMAL HIGH (ref 20.0–28.0)
O2 Saturation: 82.9 %
Patient temperature: 34
pCO2, Ven: 73 mmHg (ref 44.0–60.0)
pH, Ven: 7.37 (ref 7.250–7.430)
pO2, Ven: 41 mmHg (ref 32.0–45.0)

## 2018-07-04 LAB — COMPREHENSIVE METABOLIC PANEL
ALT: 10 U/L (ref 0–44)
AST: 14 U/L — ABNORMAL LOW (ref 15–41)
Albumin: 3.4 g/dL — ABNORMAL LOW (ref 3.5–5.0)
Alkaline Phosphatase: 37 U/L — ABNORMAL LOW (ref 38–126)
Anion gap: 6 (ref 5–15)
BUN: 16 mg/dL (ref 8–23)
CO2: 33 mmol/L — ABNORMAL HIGH (ref 22–32)
Calcium: 8.7 mg/dL — ABNORMAL LOW (ref 8.9–10.3)
Chloride: 100 mmol/L (ref 98–111)
Creatinine, Ser: 0.84 mg/dL (ref 0.44–1.00)
GFR calc Af Amer: >60 mL/min (ref >60)
GFR calc non Af Amer: >60 mL/min (ref >60)
Glucose, Bld: 137 mg/dL — ABNORMAL HIGH (ref 70–99)
Potassium: 4.3 mmol/L (ref 3.5–5.1)
Sodium: 139 mmol/L (ref 135–145)
Total Bilirubin: 0.4 mg/dL (ref 0.3–1.2)
Total Protein: 6.4 g/dL — ABNORMAL LOW (ref 6.5–8.1)

## 2018-07-04 LAB — URINALYSIS, COMPLETE (UACMP) WITH MICROSCOPIC
Bacteria, UA: NONE SEEN
Bilirubin Urine: NEGATIVE
Glucose, UA: NEGATIVE mg/dL
Hgb urine dipstick: NEGATIVE
Ketones, ur: NEGATIVE mg/dL
Leukocytes,Ua: NEGATIVE
Nitrite: NEGATIVE
Protein, ur: NEGATIVE mg/dL
Specific Gravity, Urine: 1.006 (ref 1.005–1.030)
WBC, UA: NONE SEEN WBC/hpf (ref 0–5)
pH: 5 (ref 5.0–8.0)

## 2018-07-04 LAB — CBC WITH DIFFERENTIAL/PLATELET
Abs Immature Granulocytes: 0.04 10*3/uL (ref 0.00–0.07)
Basophils Absolute: 0.1 10*3/uL (ref 0.0–0.1)
Basophils Relative: 1 %
Eosinophils Absolute: 0.3 10*3/uL (ref 0.0–0.5)
Eosinophils Relative: 3 %
HCT: 40.5 % (ref 36.0–46.0)
Hemoglobin: 12.2 g/dL (ref 12.0–15.0)
Immature Granulocytes: 0 %
Lymphocytes Relative: 15 %
Lymphs Abs: 1.6 10*3/uL (ref 0.7–4.0)
MCH: 27.2 pg (ref 26.0–34.0)
MCHC: 30.1 g/dL (ref 30.0–36.0)
MCV: 90.2 fL (ref 80.0–100.0)
Monocytes Absolute: 0.9 10*3/uL (ref 0.1–1.0)
Monocytes Relative: 9 %
Neutro Abs: 7.5 10*3/uL (ref 1.7–7.7)
Neutrophils Relative %: 72 %
Platelets: 261 10*3/uL (ref 150–400)
RBC: 4.49 MIL/uL (ref 3.87–5.11)
RDW: 14.4 % (ref 11.5–15.5)
WBC: 10.4 10*3/uL (ref 4.0–10.5)
nRBC: 0 % (ref 0.0–0.2)

## 2018-07-04 LAB — TROPONIN I
Troponin I: 0.03 ng/mL (ref <0.03)
Troponin I: <0.03 ng/mL (ref <0.03)

## 2018-07-04 LAB — GLUCOSE, CAPILLARY: Glucose-Capillary: 211 mg/dL — ABNORMAL HIGH (ref 70–99)

## 2018-07-04 LAB — BRAIN NATRIURETIC PEPTIDE: B Natriuretic Peptide: 216 pg/mL — ABNORMAL HIGH (ref 0.0–100.0)

## 2018-07-04 LAB — INFLUENZA PANEL BY PCR (TYPE A & B)
Influenza A By PCR: NEGATIVE
Influenza B By PCR: NEGATIVE

## 2018-07-04 LAB — LACTIC ACID, PLASMA: Lactic Acid, Venous: 0.7 mmol/L (ref 0.5–1.9)

## 2018-07-04 LAB — TSH: TSH: 1.58 u[IU]/mL (ref 0.350–4.500)

## 2018-07-04 MED ORDER — OCUVITE-LUTEIN PO CAPS
ORAL_CAPSULE | Freq: Every day | ORAL | Status: DC
  Administered 2018-07-05 – 2018-07-06 (×2): 1 via ORAL
  Filled 2018-07-04 (×3): qty 1

## 2018-07-04 MED ORDER — LINAGLIPTIN 5 MG PO TABS
5.0000 mg | ORAL_TABLET | Freq: Every day | ORAL | Status: DC
  Administered 2018-07-05 – 2018-07-07 (×3): 5 mg via ORAL
  Filled 2018-07-04 (×3): qty 1

## 2018-07-04 MED ORDER — GUAIFENESIN ER 600 MG PO TB12
600.0000 mg | ORAL_TABLET | Freq: Two times a day (BID) | ORAL | Status: DC
  Administered 2018-07-05 – 2018-07-07 (×6): 600 mg via ORAL
  Filled 2018-07-04 (×6): qty 1

## 2018-07-04 MED ORDER — BUDESONIDE 0.5 MG/2ML IN SUSP
0.5000 mg | Freq: Two times a day (BID) | RESPIRATORY_TRACT | Status: DC
  Administered 2018-07-05 – 2018-07-07 (×6): 0.5 mg via RESPIRATORY_TRACT
  Filled 2018-07-04 (×7): qty 2

## 2018-07-04 MED ORDER — ONDANSETRON HCL 4 MG PO TABS: 4.0000 mg | ORAL_TABLET | Freq: Four times a day (QID) | ORAL | Status: DC | PRN

## 2018-07-04 MED ORDER — SODIUM CHLORIDE 0.9% FLUSH
3.0000 mL | Freq: Two times a day (BID) | INTRAVENOUS | Status: DC
  Administered 2018-07-05 – 2018-07-07 (×6): 3 mL via INTRAVENOUS

## 2018-07-04 MED ORDER — OXYCODONE-ACETAMINOPHEN 10-325 MG PO TABS: 1.0000 | ORAL_TABLET | Freq: Three times a day (TID) | ORAL | Status: DC | PRN

## 2018-07-04 MED ORDER — GABAPENTIN 400 MG PO CAPS
800.0000 mg | ORAL_CAPSULE | Freq: Every day | ORAL | Status: DC
  Administered 2018-07-05 – 2018-07-06 (×3): 800 mg via ORAL
  Filled 2018-07-04 (×4): qty 2

## 2018-07-04 MED ORDER — INSULIN GLARGINE 100 UNIT/ML ~~LOC~~ SOLN
20.0000 [IU] | Freq: Every day | SUBCUTANEOUS | Status: DC
  Administered 2018-07-05 – 2018-07-06 (×3): 20 [IU] via SUBCUTANEOUS
  Filled 2018-07-04 (×5): qty 0.2

## 2018-07-04 MED ORDER — PHENTERMINE HCL 37.5 MG PO TABS: 37.5000 mg | ORAL_TABLET | Freq: Every day | ORAL | Status: DC

## 2018-07-04 MED ORDER — CALCIUM CARBONATE-VITAMIN D 500-200 MG-UNIT PO TABS
ORAL_TABLET | Freq: Every day | ORAL | Status: DC
  Administered 2018-07-05 – 2018-07-06 (×2): 1 via ORAL
  Filled 2018-07-04 (×2): qty 1

## 2018-07-04 MED ORDER — ACETAMINOPHEN 325 MG PO TABS: 650.0000 mg | ORAL_TABLET | Freq: Four times a day (QID) | ORAL | Status: DC | PRN

## 2018-07-04 MED ORDER — FENOFIBRATE 160 MG PO TABS
160.0000 mg | ORAL_TABLET | Freq: Every day | ORAL | Status: DC
  Administered 2018-07-05 – 2018-07-07 (×3): 160 mg via ORAL
  Filled 2018-07-04 (×3): qty 1

## 2018-07-04 MED ORDER — FUROSEMIDE 10 MG/ML IJ SOLN
40.0000 mg | Freq: Once | INTRAMUSCULAR | Status: AC
  Administered 2018-07-04: 40 mg via INTRAVENOUS
  Filled 2018-07-04: qty 4

## 2018-07-04 MED ORDER — DIPHENHYDRAMINE HCL 25 MG PO CAPS
25.0000 mg | ORAL_CAPSULE | Freq: Once | ORAL | Status: AC
  Administered 2018-07-04: 25 mg via ORAL
  Filled 2018-07-04: qty 1

## 2018-07-04 MED ORDER — FAMOTIDINE 20 MG PO TABS
20.0000 mg | ORAL_TABLET | Freq: Every day | ORAL | Status: DC
  Administered 2018-07-05 – 2018-07-07 (×3): 20 mg via ORAL
  Filled 2018-07-04 (×3): qty 1

## 2018-07-04 MED ORDER — SODIUM CHLORIDE 0.9 % IV SOLN: 250.0000 mL | INTRAVENOUS | Status: DC | PRN

## 2018-07-04 MED ORDER — INSULIN ASPART 100 UNIT/ML ~~LOC~~ SOLN
0.0000 [IU] | Freq: Three times a day (TID) | SUBCUTANEOUS | Status: DC
  Administered 2018-07-05: 3 [IU] via SUBCUTANEOUS
  Administered 2018-07-05: 0 [IU] via SUBCUTANEOUS
  Administered 2018-07-06: 4 [IU] via SUBCUTANEOUS
  Administered 2018-07-07: 3 [IU] via SUBCUTANEOUS
  Filled 2018-07-04 (×3): qty 1

## 2018-07-04 MED ORDER — DOCUSATE SODIUM 100 MG PO CAPS
100.0000 mg | ORAL_CAPSULE | Freq: Two times a day (BID) | ORAL | Status: DC
  Administered 2018-07-05 – 2018-07-07 (×6): 100 mg via ORAL
  Filled 2018-07-04 (×6): qty 1

## 2018-07-04 MED ORDER — ONDANSETRON HCL 4 MG/2ML IJ SOLN: 4.0000 mg | Freq: Four times a day (QID) | INTRAMUSCULAR | Status: DC | PRN

## 2018-07-04 MED ORDER — IPRATROPIUM-ALBUTEROL 0.5-2.5 (3) MG/3ML IN SOLN
3.0000 mL | Freq: Once | RESPIRATORY_TRACT | Status: AC
  Administered 2018-07-04: 3 mL via RESPIRATORY_TRACT
  Filled 2018-07-04: qty 3

## 2018-07-04 MED ORDER — SODIUM CHLORIDE 0.9 % IV SOLN
100.0000 mg | Freq: Two times a day (BID) | INTRAVENOUS | Status: DC
  Administered 2018-07-05 – 2018-07-06 (×3): 100 mg via INTRAVENOUS
  Filled 2018-07-04 (×5): qty 100

## 2018-07-04 MED ORDER — SODIUM CHLORIDE 0.9% FLUSH: 3.0000 mL | INTRAVENOUS | Status: DC | PRN

## 2018-07-04 MED ORDER — BUDESONIDE 0.5 MG/2ML IN SUSP: 0.5000 mg | Freq: Two times a day (BID) | RESPIRATORY_TRACT | Status: DC

## 2018-07-04 MED ORDER — ACETAMINOPHEN 650 MG RE SUPP: 650.0000 mg | Freq: Four times a day (QID) | RECTAL | Status: DC | PRN

## 2018-07-04 MED ORDER — LEVOFLOXACIN IN D5W 750 MG/150ML IV SOLN
750.0000 mg | Freq: Once | INTRAVENOUS | Status: AC
  Administered 2018-07-04: 750 mg via INTRAVENOUS
  Filled 2018-07-04: qty 150

## 2018-07-04 MED ORDER — OXYCODONE HCL 5 MG PO TABS: 5.0000 mg | ORAL_TABLET | Freq: Three times a day (TID) | ORAL | Status: DC | PRN

## 2018-07-04 MED ORDER — IPRATROPIUM-ALBUTEROL 0.5-2.5 (3) MG/3ML IN SOLN
3.0000 mL | Freq: Four times a day (QID) | RESPIRATORY_TRACT | Status: DC
  Administered 2018-07-05 – 2018-07-07 (×10): 3 mL via RESPIRATORY_TRACT
  Filled 2018-07-04 (×10): qty 3

## 2018-07-04 MED ORDER — ASPIRIN EC 81 MG PO TBEC
81.0000 mg | DELAYED_RELEASE_TABLET | Freq: Every day | ORAL | Status: DC
  Administered 2018-07-06 – 2018-07-07 (×2): 81 mg via ORAL
  Filled 2018-07-04 (×3): qty 1

## 2018-07-04 MED ORDER — CITALOPRAM HYDROBROMIDE 20 MG PO TABS
40.0000 mg | ORAL_TABLET | Freq: Every day | ORAL | Status: DC
  Administered 2018-07-05 – 2018-07-07 (×3): 40 mg via ORAL
  Filled 2018-07-04 (×3): qty 2

## 2018-07-04 MED ORDER — LISINOPRIL 20 MG PO TABS
20.0000 mg | ORAL_TABLET | Freq: Every day | ORAL | Status: DC
  Administered 2018-07-05 – 2018-07-07 (×2): 20 mg via ORAL
  Filled 2018-07-04: qty 1
  Filled 2018-07-04: qty 2
  Filled 2018-07-04: qty 1

## 2018-07-04 MED ORDER — OXYCODONE-ACETAMINOPHEN 5-325 MG PO TABS
1.0000 | ORAL_TABLET | Freq: Three times a day (TID) | ORAL | Status: DC | PRN
  Administered 2018-07-06: 1 via ORAL
  Filled 2018-07-04: qty 1

## 2018-07-04 MED ORDER — ATORVASTATIN CALCIUM 20 MG PO TABS
40.0000 mg | ORAL_TABLET | Freq: Every day | ORAL | Status: DC
  Administered 2018-07-05 – 2018-07-07 (×3): 40 mg via ORAL
  Filled 2018-07-04 (×3): qty 2

## 2018-07-04 MED ORDER — INSULIN ASPART 100 UNIT/ML ~~LOC~~ SOLN
0.0000 [IU] | Freq: Every day | SUBCUTANEOUS | Status: DC
  Administered 2018-07-05: 2 [IU] via SUBCUTANEOUS
  Filled 2018-07-04: qty 1

## 2018-07-04 MED ORDER — ENOXAPARIN SODIUM 40 MG/0.4ML ~~LOC~~ SOLN
40.0000 mg | SUBCUTANEOUS | Status: DC
  Administered 2018-07-05: 40 mg via SUBCUTANEOUS
  Filled 2018-07-04: qty 0.4

## 2018-07-04 MED ORDER — CARVEDILOL 6.25 MG PO TABS
3.1250 mg | ORAL_TABLET | Freq: Two times a day (BID) | ORAL | Status: DC
  Administered 2018-07-05 – 2018-07-07 (×6): 3.125 mg via ORAL
  Filled 2018-07-04 (×6): qty 1

## 2018-07-04 MED ORDER — METHYLPREDNISOLONE SODIUM SUCC 125 MG IJ SOLR
125.0000 mg | Freq: Once | INTRAMUSCULAR | Status: AC
  Administered 2018-07-04: 125 mg via INTRAVENOUS
  Filled 2018-07-04: qty 2

## 2018-07-04 MED ORDER — INSULIN GLARGINE 100 UNITS/ML SOLOSTAR PEN: 20.0000 [IU] | PEN_INJECTOR | Freq: Every day | SUBCUTANEOUS | Status: DC

## 2018-07-04 MED ORDER — ALBUTEROL SULFATE (2.5 MG/3ML) 0.083% IN NEBU
5.0000 mg | INHALATION_SOLUTION | Freq: Once | RESPIRATORY_TRACT | Status: AC
  Administered 2018-07-04: 5 mg via RESPIRATORY_TRACT
  Filled 2018-07-04: qty 6

## 2018-07-04 MED ORDER — ASPIRIN EC 81 MG PO TBEC
81.0000 mg | DELAYED_RELEASE_TABLET | Freq: Every day | ORAL | Status: DC
  Administered 2018-07-04 – 2018-07-05 (×2): 81 mg via ORAL
  Filled 2018-07-04 (×2): qty 1

## 2018-07-04 MED ORDER — ASPIRIN 81 MG PO CHEW
324.0000 mg | CHEWABLE_TABLET | Freq: Once | ORAL | Status: AC
  Administered 2018-07-04: 324 mg via ORAL
  Filled 2018-07-04: qty 4

## 2018-07-04 MED ORDER — LEVOTHYROXINE SODIUM 50 MCG PO TABS
75.0000 ug | ORAL_TABLET | Freq: Every day | ORAL | Status: DC
  Administered 2018-07-05: 75 ug via ORAL
  Filled 2018-07-04: qty 2

## 2018-07-04 MED ORDER — FUROSEMIDE 10 MG/ML IJ SOLN
20.0000 mg | Freq: Two times a day (BID) | INTRAMUSCULAR | Status: DC
  Administered 2018-07-05 – 2018-07-07 (×6): 20 mg via INTRAVENOUS
  Filled 2018-07-04 (×6): qty 4

## 2018-07-04 MED ORDER — NICOTINE 14 MG/24HR TD PT24
14.0000 mg | MEDICATED_PATCH | Freq: Every day | TRANSDERMAL | Status: DC
  Administered 2018-07-05 – 2018-07-07 (×3): 14 mg via TRANSDERMAL
  Filled 2018-07-04 (×3): qty 1

## 2018-07-04 NOTE — ED Provider Notes (Addendum)
[facility] Emergency Department Provider Note  ____________________________________________  Time seen: Approximately 6:23 PM  I have reviewed the triage vital signs and the nursing notes.   HISTORY  Chief Complaint Shortness of Breath    HPI Kristin [name] is a 68 y.o. female with a history of COPD on 2 to 3 L nasal cannula and CHF, ongoing tobacco abuse, presenting with cough and shortness of breath.  The patient reports that she has a chronic cough, but that it has been worse and that she is having increasingly progressive exertional dyspnea for the past 2 weeks.  Has been taking her albuterol without significant improvement.  She is had some mild congestion without rhinorrhea and denies any sore throat or ear pain; no fevers or chills.  No significant lower extremity edema.  She is not on a diuretic.  She reports that until 2 weeks ago, she was using 2 L nasal cannula and now is requiring 3 L to stay comfortable.  Her exercise tolerance has significantly decreased.  She has not had any chest pain, palpitations, lightheadedness or syncope.  She does report a diffuse abdominal pain, most prominent in the suprapubic region, without any nausea vomiting or diarrhea.  No dysuria.  She did have her influenza vaccination this year.   Patient reports that she has been hospitalized in [place] within the last 3 months.  Past Medical History:  Diagnosis Date  . Anxiety   . Asthma   . CHF (congestive heart failure) ([place])    2018  . COPD (chronic obstructive pulmonary disease) ([place])   . Diabetes mellitus without complication ([place])   . Dyspnea   . GERD (gastroesophageal reflux disease)   . History of hiatal hernia   . History of orthopnea   . Hypothyroidism   . Neuropathy   . Oxygen deficiency    3L/HS  . Pain    BACK/DDD  . Peripheral vascular disease ([place])   . RLS (restless legs syndrome)   . Sleep apnea   . Wheezing     There are no active problems to  display for this patient.   Past Surgical History:  Procedure Laterality Date  . BREAST SURGERY    . CATARACT EXTRACTION W/PHACO Right 03/02/2018   Procedure: CATARACT EXTRACTION PHACO AND INTRAOCULAR LENS PLACEMENT ([place]);  Surgeon: Marchia Meiers, MD;  Location: [facility] ORS;  Service: Ophthalmology;  Laterality: Right;  [place] 01:15 CDE 16.46 Fluid pack lot # [id number]  . CATARACT EXTRACTION W/PHACO Left 04/27/2018   Procedure: CATARACT EXTRACTION PHACO AND INTRAOCULAR LENS PLACEMENT ([place])- LEFT DIABETIC;  Surgeon: Marchia Meiers, MD;  Location: [facility] ORS;  Service: Ophthalmology;  Laterality: Left;  Lot # [id number] H [place]: 00:46.3 CDE: 8.07   . CYST EXCISION     FOREHEAD  . FOOT SURGERY     CYST  . TUBAL LIGATION      Current Outpatient Rx  . Order #: [id number] Class: Historical Med  . Order #: [id number] Class: Historical Med  . Order #: [id number] Class: Historical Med  . Order #: [id number] Class: Historical Med  . Order #: [id number] Class: Historical Med  . Order #: [id number] Class: Historical Med  . Order #: [id number] Class: Historical Med  . Order #: [id number] Class: Historical Med  . Order #: [id number] Class: Historical Med  . Order #: [id number] Class: Historical Med  . Order #: [id number] Class: Historical Med  . Order #: [id number] Class: Historical Med  . Order #: [id number] Class: Historical Med  . Order #: [id number] Class: Historical Med  . Order #: [id number] Class:  Historical Med  . Order #: [id number] Class: Historical Med  . Order #: [id number] Class: Historical Med    Allergies Altace [ramipril]  No family history on file.  Social History Social History   Tobacco Use  . Smoking status: Current Every Day Smoker  . Smokeless tobacco: Never Used  Substance Use Topics  . Alcohol use: Yes  . Drug use: Not on file    Review of Systems Constitutional: No fever/chills.  No lightheadedness or syncope.  No diaphoresis.  No myalgias. Eyes: No visual changes. ENT: No sore throat.  Positive  congestion without rhinorrhea. Cardiovascular: Denies chest pain. Denies palpitations. Respiratory: + wheezing and shortness of breath.  Positive productive cough.  Decreased exercise tolerance. Gastrointestinal: Positive diffuse as well as suprapubic abdominal pain.  No nausea, no vomiting.  No diarrhea.  No constipation. Genitourinary: Negative for dysuria. Musculoskeletal: Negative for back pain.  No lower extremity edema Skin: Negative for rash. Neurological: Negative for headaches. No focal numbness, tingling or weakness.     ____________________________________________   PHYSICAL EXAM:  VITAL SIGNS: ED Triage Vitals  Enc Vitals Group     BP 07/04/18 1519 (!) 148/78     Pulse Rate 07/04/18 1519 100     Resp 07/04/18 1519 (!) 22     Temp 07/04/18 1519 98.2 F (36.8 C)     Temp src --      SpO2 07/04/18 1518 97 %     Weight 07/04/18 1520 241 lb (109.3 kg)     Height 07/04/18 1520 5\' 3"  (1.6 m)     Head Circumference --      Peak Flow --      Pain Score 07/04/18 1519 7     Pain Loc --      Pain Edu? --      Excl. in [place]? --     Constitutional: Alert and oriented.  Answers questions appropriately.  Chronically ill-appearing. Eyes: Conjunctivae are normal.  EOMI. No scleral icterus. Head: Atraumatic. Nose: No congestion/rhinnorhea. Mouth/Throat: Mucous membranes are moist.  Neck: No stridor.  Supple.  No JVD.  No meningismus. Cardiovascular: Normal rate, regular rhythm. No murmurs, rubs or gallops.  Respiratory: Positive tachypnea with a prolonged expiratory phase.  No accessory muscle use or retractions.  The patient has significant decreased exercise tolerance while she is able to maintain oxygen saturations in the 90s with ambulation, she does become significantly dyspneic after walking only 15 m.  She has diffuse inspiratory and expiratory wheezing bilaterally with rales in the bases bilaterally. Gastrointestinal: Obese.  Soft, and nondistended.  Tender to palpation in  the suprapubic region.  No guarding or rebound.  No peritoneal signs. Musculoskeletal: Minimal nonpitting symmetric LE edema. No ttp in the calves or palpable cords.  Negative Homan's sign. Neurologic:  A&Ox3.  Speech is clear.  Face and smile are symmetric.  EOMI.  Moves all extremities well. Skin:  Skin is warm, dry and intact. No rash noted. Psychiatric: Mood and affect are normal. Speech and behavior are normal.  Normal judgement.  ____________________________________________   LABS (all labs ordered are listed, but only abnormal results are displayed)  Labs Reviewed  COMPREHENSIVE METABOLIC PANEL - Abnormal; Notable for the following components:      Result Value   CO2 33 (*)    Glucose, Bld 137 (*)    Calcium 8.7 (*)    Total Protein 6.4 (*)    Albumin 3.4 (*)    AST 14 (*)    Alkaline Phosphatase 37 (*)  All other components within normal limits  TROPONIN I - Abnormal; Notable for the following components:   Troponin I 0.03 (*)    All other components within normal limits  BRAIN NATRIURETIC PEPTIDE - Abnormal; Notable for the following components:   B Natriuretic Peptide 216.0 (*)    All other components within normal limits  URINE CULTURE  CBC WITH DIFFERENTIAL/PLATELET  TROPONIN I  LACTIC ACID, PLASMA  LACTIC ACID, PLASMA  BLOOD GAS, VENOUS  URINALYSIS, COMPLETE (UACMP) WITH MICROSCOPIC  INFLUENZA PANEL BY PCR (TYPE A & B)   ____________________________________________  EKG  ED ECG REPORT I, Anne-Caroline Mariea Clonts, the attending physician, personally viewed and interpreted this ECG.   Date: 07/04/2018  EKG Time: 1530  Rate: 90  Rhythm: normal sinus rhythm  Axis: normal  Intervals:borderline prolonged QTc  ST&T Change: No STEMI; poor baseline tracing.  ____________________________________________  RADIOLOGY  Dg Chest 2 View  Result Date: 07/04/2018 CLINICAL DATA:  Shortness of breath EXAM: CHEST - 2 VIEW COMPARISON:  01/28/2018 FINDINGS: Small  bilateral pleural effusions right greater than left. Cardiomegaly with diffuse interstitial opacity. Aortic atherosclerosis. No pneumothorax. IMPRESSION: Cardiomegaly with diffuse interstitial process, likely edema. Small bilateral pleural effusions, right greater than left. Electronically Signed   By: Donavan Foil M.D.   On: 07/04/2018 16:40    ____________________________________________   PROCEDURES  Procedure(s) performed: None  Procedures  Critical Care performed: Yes ____________________________________________   INITIAL IMPRESSION / ASSESSMENT AND PLAN / ED COURSE  Pertinent labs & imaging results that were available during my care of the patient were reviewed by me and considered in my medical decision making (see chart for details).  68 y.o. female with a history of COPD and CHF presenting with cough and progressively worsening shortness of breath with exertion and decreased exercise tolerance.  Overall, the patient does have some signs and symptoms that would be consistent with infection and her chest x-ray shows mostly edema and pleural effusions but I cannot exclude that she has an infectious process.  She has been hospitalized in the past 3 months in [place], so we will cover her with Levaquin for HCAP.  Solu-Medrol and DuoNeb have been ordered.  Influenza testing is pending.  Blood cultures have been obtained.  The patient has a normal white blood cell count.  A BNP is pending.  Her troponin is 0.03 and I will treat her with aspirin; this will need to be trended.  I am concerned about CHF exacerbation and the patient is not on chronic diuretics.  Lasix has been ordered and we will not use fluid to resuscitate her for her infection.  Given her suprapubic pain, will get a UA.  Early appendicitis is very unlikely.  Patient will require admission to the hospital for continued evaluation and treatment.  ----------------------------------------- 7:03 PM on  07/04/2018 -----------------------------------------  Patient's repeat troponin is negative.  Her BNP is elevated, however, she has received Lasix and remained hemodynamically stable.  The patient's influenza testing is pending.  At this time, the patient will be admitted to the hospitalist for ongoing treatment and evaluation.  ----------------------------------------- 7:17 PM on 07/04/2018 -----------------------------------------  The patient's pH is normal but her PCO2 is 73 today.  She probably has chronic hypercarbia, but I am concerned that with her respiratory status this is worse than usual so we will put her on BiPAP at this time.  CRITICAL CARE Performed by: Eula Listen   Total critical care time: 35 minutes  Critical care time was exclusive  of separately billable procedures and treating other patients.  Critical care was necessary to treat or prevent imminent or life-threatening deterioration.  Critical care was time spent personally by me on the following activities: development of treatment plan with patient and/or surrogate as well as nursing, discussions with consultants, evaluation of patient's response to treatment, examination of patient, obtaining history from patient or surrogate, ordering and performing treatments and interventions, ordering and review of laboratory studies, ordering and review of radiographic studies, pulse oximetry and re-evaluation of patient's condition.  ____________________________________________  FINAL CLINICAL IMPRESSION(S) / ED DIAGNOSES  Final diagnoses:  Acute on chronic congestive heart failure, unspecified heart failure type ([place])  Cough  Decreased exercise tolerance  Tobacco abuse         NEW MEDICATIONS STARTED DURING THIS VISIT:  New Prescriptions   No medications on file      Eula Listen, MD 07/04/18 1904    Eula Listen, MD 07/04/18 [phone]

## 2018-07-04 NOTE — ED Notes (Signed)
Patient being placed on Bipap at this time by RT. Will continue to monitor.

## 2018-07-04 NOTE — ED Triage Notes (Signed)
PT arrives with concerns over increase in shortness of breath over the last 2 weeks. Pt wears 3L via nasal canula chronically. Pt states she has been treating at home with albuterol; last treatment last night.

## 2018-07-04 NOTE — H&P (Signed)
[place] [facility] [place] NAME: Kristin [name]    MR#:  [id number]  DATE OF BIRTH:  Jul 30, 1950  DATE OF ADMISSION:  07/04/2018  PRIMARY CARE PHYSICIAN: Kristin Garibaldi, FNP   REQUESTING/REFERRING PHYSICIAN:   CHIEF COMPLAINT:   Chief Complaint  Patient presents with  . Shortness of Breath    HISTORY OF PRESENT ILLNESS: Kristin [name]  is a 68 y.o. female with a known history per below, noted history of COPD/asthma, obstructive sleep apnea-noncompliant with CPAP, presenting with 2-week history of intermittent shortness of breath, dyspnea on exertion, intermittent cough, presented to the emergency room with increased O2 requirement of 3 L via nasal cannula-typically on 2 L continuous, admits to continued tobacco smoking, and emergency room patient was found to have minimally elevated BNP, chest x-ray noted for questionable edema, troponin negative x2, patient evaluated in the emergency room, no apparent distress, resting comfortably in bed, patient is now being admitted for acute on chronic hypoxic respiratory failure most likely secondary to COPD/asthmatic exacerbation >>> CHF.  PAST MEDICAL HISTORY:   Past Medical History:  Diagnosis Date  . Anxiety   . Asthma   . CHF (congestive heart failure) ([place])    2018  . COPD (chronic obstructive pulmonary disease) ([place])   . Diabetes mellitus without complication ([place])   . Dyspnea   . GERD (gastroesophageal reflux disease)   . History of hiatal hernia   . History of orthopnea   . Hypothyroidism   . Neuropathy   . Oxygen deficiency    3L/HS  . Pain    BACK/DDD  . Peripheral vascular disease ([place])   . RLS (restless legs syndrome)   . Sleep apnea   . Wheezing     PAST SURGICAL HISTORY:  Past Surgical History:  Procedure Laterality Date  . BREAST SURGERY    . CATARACT EXTRACTION W/PHACO Right 03/02/2018   Procedure: CATARACT EXTRACTION PHACO AND INTRAOCULAR LENS PLACEMENT ([place]);  Surgeon: Marchia Meiers, MD;  Location: [facility] ORS;  Service: Ophthalmology;  Laterality: Right;  [place] 01:15 CDE 16.46 Fluid pack lot # [id number]  . CATARACT EXTRACTION W/PHACO Left 04/27/2018   Procedure: CATARACT EXTRACTION PHACO AND INTRAOCULAR LENS PLACEMENT ([place])- LEFT DIABETIC;  Surgeon: Marchia Meiers, MD;  Location: [facility] ORS;  Service: Ophthalmology;  Laterality: Left;  Lot # [id number] H [place]: 00:46.3 CDE: 8.07   . CYST EXCISION     FOREHEAD  . FOOT SURGERY     CYST  . TUBAL LIGATION      SOCIAL HISTORY:  Social History   Tobacco Use  . Smoking status: Current Every Day Smoker  . Smokeless tobacco: Never Used  Substance Use Topics  . Alcohol use: Yes    FAMILY HISTORY:  Hypertension  DRUG ALLERGIES:  Allergies  Allergen Reactions  . Altace [Ramipril] Swelling    REVIEW OF SYSTEMS:   CONSTITUTIONAL: No fever, fatigue or weakness.  EYES: No blurred or double vision.  EARS, NOSE, AND THROAT: No tinnitus or ear pain.  RESPIRATORY: + cough, shortness of breath, wheezing   CARDIOVASCULAR: No chest pain, orthopnea, edema.  GASTROINTESTINAL: No nausea, vomiting, diarrhea or abdominal pain.  GENITOURINARY: No dysuria, hematuria.  ENDOCRINE: No polyuria, nocturia,  HEMATOLOGY: No anemia, easy bruising or bleeding SKIN: No rash or lesion. MUSCULOSKELETAL: No joint pain or arthritis.   NEUROLOGIC: No tingling, numbness, weakness.  PSYCHIATRY: No anxiety or depression.   MEDICATIONS AT HOME:  Prior to Admission medications   Medication Sig Start Date  End Date Taking? Authorizing Provider  albuterol (PROAIR HFA) 108 (90 Base) MCG/ACT inhaler Inhale 2 puffs into the lungs every 6 (six) hours as needed for wheezing or shortness of breath.    [provider]  aspirin EC 81 MG tablet Take 81 mg by mouth daily.    [provider]  atorvastatin (LIPITOR) 40 MG tablet Take 40 mg by mouth daily.    [provider]  Calcium Carb-Cholecalciferol (CALCIUM 600+D3 PO) Take 1 tablet  by mouth daily.    [provider]  citalopram (CELEXA) 40 MG tablet Take 40 mg by mouth daily.    [provider]  cyanocobalamin (,VITAMIN B-12,) 1000 MCG/ML injection Inject 1,000 mcg into the muscle every 30 (thirty) days.    [provider]  fenofibrate 160 MG tablet Take 160 mg by mouth daily.    [provider]  gabapentin (NEURONTIN) 400 MG capsule Take 800 mg by mouth at bedtime.     [provider]  levothyroxine (SYNTHROID, LEVOTHROID) 75 MCG tablet Take 75 mcg by mouth daily before breakfast.    [provider]  lisinopril (PRINIVIL,ZESTRIL) 20 MG tablet Take 20 mg by mouth daily.    [provider]  Multiple Vitamins-Minerals (PRESERVISION AREDS PO) Take 1 tablet by mouth daily.     [provider]  oxyCODONE-acetaminophen (PERCOCET) 10-325 MG tablet Take 1 tablet by mouth every 6 (six) hours as needed for pain.     [provider]  OXYGEN Inhale 3 L into the lungs.    [provider]  phentermine (ADIPEX-P) 37.5 MG tablet Take 37.5 mg by mouth daily before breakfast.    [provider]  ranitidine (ZANTAC) 150 MG tablet Take 150 mg by mouth 2 (two) times daily.    [provider]  sitaGLIPtin (JANUVIA) 100 MG tablet Take 100 mg by mouth daily.    [provider]  Tiotropium Bromide Monohydrate (SPIRIVA RESPIMAT) 1.25 MCG/ACT AERS Inhale 1 puff into the lungs 2 (two) times daily.     [provider]      PHYSICAL EXAMINATION:   VITAL SIGNS: Blood pressure (!) 148/78, pulse 100, temperature 98.2 F (36.8 C), resp. rate (!) 22, height 5\' 3"  (1.6 m), weight 109.3 kg, SpO2 95 %.  GENERAL:  68 y.o.-year-old patient lying in the bed with no acute distress.  Morbidly obese, nontoxic-appearing EYES: Pupils equal, round, reactive to light and accommodation. No scleral icterus. Extraocular muscles intact.  HEENT: Head atraumatic, normocephalic. Oropharynx and  nasopharynx clear.  NECK:  Supple, no jugular venous distention. No thyroid enlargement, no tenderness.  LUNGS: Diminished breath sounds bilaterally with rhonchi. No use of accessory muscles of respiration.  CARDIOVASCULAR: S1, S2 normal. No murmurs, rubs, or gallops.  ABDOMEN: Soft, nontender, nondistended. Bowel sounds present. No organomegaly or mass.  EXTREMITIES: No pedal edema, cyanosis, or clubbing.  NEUROLOGIC: Cranial nerves II through XII are intact. Muscle strength 5/5 in all extremities. Sensation intact. Gait not checked.  PSYCHIATRIC: The patient is alert and oriented x 3.  SKIN: No obvious rash, lesion, or ulcer.   LABORATORY PANEL:   CBC Recent Labs  Lab 07/04/18 1535  WBC 10.4  HGB 12.2  HCT 40.5  PLT 261  MCV 90.2  MCH 27.2  MCHC 30.1  RDW 14.4  LYMPHSABS 1.6  MONOABS 0.9  EOSABS 0.3  BASOSABS 0.1   ------------------------------------------------------------------------------------------------------------------  Chemistries  Recent Labs  Lab 07/04/18 1535  NA 139  K 4.3  CL 100  CO2 33*  GLUCOSE 137*  BUN 16  CREATININE 0.84  CALCIUM 8.7*  AST 14*  ALT 10  ALKPHOS 37*  BILITOT 0.4   ------------------------------------------------------------------------------------------------------------------ estimated creatinine clearance is 78.2 mL/min (by C-G formula based on SCr of 0.84 mg/dL). ------------------------------------------------------------------------------------------------------------------ No results for input(s): TSH, T4TOTAL, T3FREE, THYROIDAB in the last 72 hours.  Invalid input(s): FREET3   Coagulation profile No results for input(s): INR, PROTIME in the last 168 hours. ------------------------------------------------------------------------------------------------------------------- No results for input(s): DDIMER in the last 72  hours. -------------------------------------------------------------------------------------------------------------------  Cardiac Enzymes Recent Labs  Lab 07/04/18 1535 07/04/18 1813  TROPONINI 0.03* <0.03   ------------------------------------------------------------------------------------------------------------------ Invalid input(s): POCBNP  ---------------------------------------------------------------------------------------------------------------  Urinalysis No results found for: COLORURINE, APPEARANCEUR, LABSPEC, PHURINE, GLUCOSEU, HGBUR, BILIRUBINUR, KETONESUR, PROTEINUR, UROBILINOGEN, NITRITE, LEUKOCYTESUR   RADIOLOGY: Dg Chest 2 View  Result Date: 07/04/2018 CLINICAL DATA:  Shortness of breath EXAM: CHEST - 2 VIEW COMPARISON:  01/28/2018 FINDINGS: Small bilateral pleural effusions right greater than left. Cardiomegaly with diffuse interstitial opacity. Aortic atherosclerosis. No pneumothorax. IMPRESSION: Cardiomegaly with diffuse interstitial process, likely edema. Small bilateral pleural effusions, right greater than left. Electronically Signed   By: Donavan Foil M.D.   On: 07/04/2018 16:40    EKG: Orders placed or performed during the hospital encounter of 07/04/18  . ED EKG  . ED EKG  . EKG 12-Lead  . EKG 12-Lead    IMPRESSION AND PLAN: *Acute on chronic hypoxic respiratory failure Most likely due to multifactorial process that includes acute on COPD/asthmatic exacerbation >>> CHF, OSA for which patient is noncompliant with CPAP Admit to regular nursing floor bed, CPAP at bedtime/as needed, supplemental oxygen with weaning as tolerated Note-patient on 2 L via nasal cannula chronically at home, currently on 3 L  *Acute on COPD/asthmatic exacerbation This is thought to be the principal problem for this patient IV Solu-Medrol with tapering as tolerated, empiric doxycycline for 5-day course, mucolytic agents, aggressive pulmonary toilet with bronchodilator  therapy, inhaled corticosteroids, and continue close medical monitoring  *Acute suspected CHF exacerbation CHF protocol, low-dose IV Lasix twice daily, Coreg, losartan, strict I&O monitoring, daily weights, check echocardiogram, aspirin, and continue close medical monitoring  *Acute on chronic tobacco smoker abuse/dependency Nicotine patch and cessation counseling ordered  *Chronic morbid obesity Most likely secondary to excess calories Lifestyle Modification recommended  *Chronic diabetes mellitus type 2 Continue home regiment, Lantus at bedtime given use of steroids, Accu-Cheks per routine, heart healthy/carbohydrate consistent diet  *Obstructive sleep apnea Noncompliant with CPAP CPAP at bedtime/as needed  All the records are reviewed and case discussed with ED provider. Management plans discussed with the patient, family and they are in agreement.  CODE STATUS:full    TOTAL TIME TAKING CARE OF THIS PATIENT: 40 minutes.    Avel Peace Salary M.D on 07/04/2018   Between 7am to 6pm - Pager - [phone]  After 6pm go to [email or web address] - password [email or web address] [facility]  Office  [phone]  CC: Primary care physician; Kristin Garibaldi, FNP   Note: This dictation was prepared with Dragon dictation along with smaller phrase technology. Any transcriptional errors that result from this process are unintentional.

## 2018-07-04 NOTE — ED Notes (Signed)
Admitting Dr. Jerelyn Charles in with patient at this time.

## 2018-07-04 NOTE — ED Notes (Signed)
Sandwich tray given with diet soda.

## 2018-07-04 NOTE — ED Notes (Signed)
Per Admitting Dr. Jerelyn Charles remove patient from [place] and place back on O2 by nasal canula.

## 2018-07-04 NOTE — Progress Notes (Signed)
Family Meeting Note  Advance Directive:yes  Today a meeting took place with the Patient.  Patient is able to participate   The following clinical team members were present during this meeting:MD  The following were discussed:Patient's diagnosis: copd, Patient's progosis: Unable to determine and Goals for treatment: Full Code  Additional follow-up to be provided: prn  Time spent during discussion:20 minutes  Gorden Harms, MD

## 2018-07-04 NOTE — ED Notes (Signed)
Pt ambulated by this RN and Thedore Mins, EDT. Pt noted to be severely dyspneic with exertion, however sats on 3L via [place] 99-100% while ambulating.

## 2018-07-04 NOTE — ED Notes (Signed)
Patient assisted to restroom. Patient back in bed with BiPap. Patient O2 WDL.

## 2018-07-04 NOTE — ED Notes (Signed)
Pt pants removed and a purewick cath placed for pt to urinate. Pt adjusted in bed to a more comfortable position. IV site reinforced with tape.

## 2018-07-05 ENCOUNTER — Inpatient Hospital Stay (HOSPITAL_COMMUNITY)
Admit: 2018-07-05 | Discharge: 2018-07-05 | Disposition: A | Discharge status: Home / Self Care | Payer: Medicare Other | Attending: Family Medicine | Admitting: Family Medicine

## 2018-07-05 DIAGNOSIS — R9431 Abnormal electrocardiogram [ECG] [EKG]: Secondary | ICD-10-CM

## 2018-07-05 LAB — GLUCOSE, CAPILLARY
Glucose-Capillary: 114 mg/dL — ABNORMAL HIGH (ref 70–99)
Glucose-Capillary: 143 mg/dL — ABNORMAL HIGH (ref 70–99)
Glucose-Capillary: 93 mg/dL (ref 70–99)
Glucose-Capillary: 109 mg/dL — ABNORMAL HIGH (ref 70–99)

## 2018-07-05 LAB — BASIC METABOLIC PANEL
Anion gap: 7 (ref 5–15)
BUN: 18 mg/dL (ref 8–23)
CO2: 35 mmol/L — ABNORMAL HIGH (ref 22–32)
Calcium: 8.6 mg/dL — ABNORMAL LOW (ref 8.9–10.3)
Chloride: 98 mmol/L (ref 98–111)
Creatinine, Ser: 0.75 mg/dL (ref 0.44–1.00)
GFR calc Af Amer: >60 mL/min (ref >60)
GFR calc non Af Amer: >60 mL/min (ref >60)
Glucose, Bld: 137 mg/dL — ABNORMAL HIGH (ref 70–99)
Potassium: 4.2 mmol/L (ref 3.5–5.1)
Sodium: 140 mmol/L (ref 135–145)

## 2018-07-05 LAB — ECHOCARDIOGRAM COMPLETE
Height: 63 in
Weight: 3856 oz

## 2018-07-05 LAB — TSH: TSH: 0.536 u[IU]/mL (ref 0.350–4.500)

## 2018-07-05 MED ORDER — ENOXAPARIN SODIUM 40 MG/0.4ML ~~LOC~~ SOLN
40.0000 mg | Freq: Two times a day (BID) | SUBCUTANEOUS | Status: DC
  Administered 2018-07-05 – 2018-07-06 (×4): 40 mg via SUBCUTANEOUS
  Filled 2018-07-05 (×4): qty 0.4

## 2018-07-05 MED ORDER — PERFLUTREN LIPID MICROSPHERE
1.0000 mL | INTRAVENOUS | Status: AC | PRN
  Administered 2018-07-05: 3 mL via INTRAVENOUS
  Filled 2018-07-05: qty 10

## 2018-07-05 MED ORDER — LEVOTHYROXINE SODIUM 50 MCG PO TABS
75.0000 ug | ORAL_TABLET | Freq: Every day | ORAL | Status: DC
  Administered 2018-07-06 – 2018-07-07 (×2): 75 ug via ORAL
  Filled 2018-07-05 (×2): qty 2

## 2018-07-05 MED ORDER — ALUM & MAG HYDROXIDE-SIMETH 200-200-20 MG/5ML PO SUSP
30.0000 mL | ORAL | Status: DC | PRN
  Administered 2018-07-05: 30 mL via ORAL
  Filled 2018-07-05: qty 30

## 2018-07-05 NOTE — Progress Notes (Signed)
[facility] [place] NAME: Glada Zwicker    MR#:  [id number]  DATE OF BIRTH:  08-16-1950  SUBJECTIVE:   States she is feeling better than when she first came in last night.  She still is mildly short of breath.  She wears CPAP overnight.  She denies any fevers, chills, cough.  REVIEW OF SYSTEMS:  Review of Systems  Constitutional: Negative for chills and fever.  HENT: Negative for congestion and sore throat.   Eyes: Negative for blurred vision and double vision.  Respiratory: Positive for shortness of breath and wheezing. Negative for cough.   Cardiovascular: Negative for chest pain and palpitations.  Gastrointestinal: Negative for nausea and vomiting.  Genitourinary: Negative for dysuria and urgency.  Musculoskeletal: Negative for back pain and neck pain.  Neurological: Negative for dizziness and headaches.  Psychiatric/Behavioral: Negative for depression. The patient is not nervous/anxious.     DRUG ALLERGIES:   Allergies  Allergen Reactions  . Altace [Ramipril] Swelling   VITALS:  Blood pressure (!) 151/76, pulse 88, temperature 98.3 F (36.8 C), temperature source Oral, resp. rate 17, height 5\' 3"  (1.6 m), weight 109.2 kg, SpO2 97 %. PHYSICAL EXAMINATION:  Physical Exam  GENERAL:  68 y.o.-year-old patient lying in the bed with no acute distress.  Morbidly obese, nontoxic-appearing EYES: Pupils equal, round, reactive to light and accommodation. No scleral icterus. Extraocular muscles intact.  HEENT: Head atraumatic, normocephalic. Oropharynx and nasopharynx clear.  NECK:  Supple, no jugular venous distention. No thyroid enlargement, no tenderness.  LUNGS: Diminished breath sounds bilaterally, + diffuse expiratory wheezing present. + Bibasilar crackles present.  No use of accessory muscles of respiration.  CARDIOVASCULAR: RRR, S1, S2 normal. No murmurs, rubs, or gallops.  ABDOMEN: Soft, nontender, nondistended. Bowel sounds  present. No organomegaly or mass.  EXTREMITIES: No pedal edema, cyanosis, or clubbing.  NEUROLOGIC: Cranial nerves II through XII are intact. Muscle strength 5/5 in all extremities. Sensation intact. Gait not checked.  PSYCHIATRIC: The patient is alert and oriented x 3.  SKIN: No obvious rash, lesion, or ulcer.  LABORATORY PANEL:  Female CBC Recent Labs  Lab 07/04/18 1535  WBC 10.4  HGB 12.2  HCT 40.5  PLT 261   ------------------------------------------------------------------------------------------------------------------ Chemistries  Recent Labs  Lab 07/04/18 1535 07/05/18 0649  NA 139 140  K 4.3 4.2  CL 100 98  CO2 33* 35*  GLUCOSE 137* 137*  BUN 16 18  CREATININE 0.84 0.75  CALCIUM 8.7* 8.6*  AST 14*  --   ALT 10  --   ALKPHOS 37*  --   BILITOT 0.4  --    RADIOLOGY:  Dg Chest 2 View  Result Date: 07/04/2018 CLINICAL DATA:  Shortness of breath EXAM: CHEST - 2 VIEW COMPARISON:  01/28/2018 FINDINGS: Small bilateral pleural effusions right greater than left. Cardiomegaly with diffuse interstitial opacity. Aortic atherosclerosis. No pneumothorax. IMPRESSION: Cardiomegaly with diffuse interstitial process, likely edema. Small bilateral pleural effusions, right greater than left. Electronically Signed   By: Donavan Foil M.D.   On: 07/04/2018 16:40   ASSESSMENT AND PLAN:   Acute on chronic hypoxic respiratory failure- likely due to COPD, CHF, OSA. -Stable on home 3 L of oxygen -CPAP at bedtime  Acute on COPD/asthmatic exacerbation- still with diffuse wheezing on exam. -Continue IV Solu-Medrol -Continue doxycycline -DuoNebs PRN -Continue home inhalers  Acute suspected CHF exacerbation-x-ray with edema and small bilateral pleural effusions.  BNP mildly elevated to 216. -Continue Lasix 20 mg IV  twice daily -Continue Coreg and losartan -Echo pending -Strict I/O, daily weights -Would likely benefit from home health heart failure protocol on discharge  Acute on  chronic tobacco smoker abuse/dependency -Nicotine patch and cessation counseling provided  Type 2 diabetes- blood sugars elevated in the setting of steroids -Continue Lantus 20 units daily and SSI  Obstructive sleep apnea -CPAP qhs  All the records are reviewed and case discussed with Care Management/Social Worker. Management plans discussed with the patient, family and they are in agreement.  CODE STATUS: Full Code  TOTAL TIME TAKING CARE OF THIS PATIENT: 40 minutes.   More than 50% of the time was spent in counseling/coordination of care: YES  POSSIBLE D/C IN 1-2 DAYS, DEPENDING ON CLINICAL CONDITION.   Berna Spare Roxsana Riding M.D on 07/05/2018 at 2:30 PM  Between 7am to 6pm - Pager [phone]  After 6pm go to [email or web address] - Technical brewer [facility]  Office  [phone]  CC: Primary care physician; Suzan Garibaldi, FNP  Note: This dictation was prepared with Dragon dictation along with smaller phrase technology. Any transcriptional errors that result from this process are unintentional.

## 2018-07-05 NOTE — ED Notes (Signed)
Patient resting with her eyes closed. Patient has CPAP on. No complaints from patient. Awaiting room on floor for admission.  Will continue to monitor

## 2018-07-05 NOTE — ED Notes (Signed)
Pt given breakfast tray and coffee

## 2018-07-05 NOTE — Care Management (Signed)
RNCM following.  Patient to be assessed for heart failure protocol.

## 2018-07-05 NOTE — Progress Notes (Signed)
*  PRELIMINARY RESULTS* Echocardiogram 2D Echocardiogram has been performed.  Kristin [name] 07/05/2018, 9:23 AM

## 2018-07-05 NOTE — ED Notes (Signed)
technician at bedside for echo will administer remaining meds post procedure

## 2018-07-05 NOTE — Progress Notes (Addendum)
RT to patient bedside to assist with CPAP, patient states she is waiting on "[place] sandwich". Will call when ready for CPAP   CPAP placed at 0113 per patient request.

## 2018-07-05 NOTE — ED Notes (Signed)
Patient placed on CPAP.  Patient appears to be tolerating with no issues. Will continue to monitor.

## 2018-07-06 LAB — BASIC METABOLIC PANEL
Anion gap: 6 (ref 5–15)
BUN: 32 mg/dL — ABNORMAL HIGH (ref 8–23)
CO2: 36 mmol/L — ABNORMAL HIGH (ref 22–32)
Calcium: 8.5 mg/dL — ABNORMAL LOW (ref 8.9–10.3)
Chloride: 97 mmol/L — ABNORMAL LOW (ref 98–111)
Creatinine, Ser: 0.98 mg/dL (ref 0.44–1.00)
GFR calc Af Amer: >60 mL/min (ref >60)
GFR calc non Af Amer: >60 mL/min (ref >60)
Glucose, Bld: 104 mg/dL — ABNORMAL HIGH (ref 70–99)
Potassium: 3.8 mmol/L (ref 3.5–5.1)
Sodium: 139 mmol/L (ref 135–145)

## 2018-07-06 LAB — CBC
HCT: 39.1 % (ref 36.0–46.0)
Hemoglobin: 11.8 g/dL — ABNORMAL LOW (ref 12.0–15.0)
MCH: 26.9 pg (ref 26.0–34.0)
MCHC: 30.2 g/dL (ref 30.0–36.0)
MCV: 89.1 fL (ref 80.0–100.0)
Platelets: 239 10*3/uL (ref 150–400)
RBC: 4.39 MIL/uL (ref 3.87–5.11)
RDW: 14.5 % (ref 11.5–15.5)
WBC: 11.5 10*3/uL — ABNORMAL HIGH (ref 4.0–10.5)
nRBC: 0 % (ref 0.0–0.2)

## 2018-07-06 LAB — URINE CULTURE: Culture: NO GROWTH

## 2018-07-06 LAB — GLUCOSE, CAPILLARY
Glucose-Capillary: 81 mg/dL (ref 70–99)
Glucose-Capillary: 90 mg/dL (ref 70–99)
Glucose-Capillary: 162 mg/dL — ABNORMAL HIGH (ref 70–99)
Glucose-Capillary: 170 mg/dL — ABNORMAL HIGH (ref 70–99)

## 2018-07-06 LAB — HIV ANTIBODY (ROUTINE TESTING W REFLEX): HIV Screen 4th Generation wRfx: NONREACTIVE

## 2018-07-06 MED ORDER — OXYCODONE HCL 5 MG PO TABS
5.0000 mg | ORAL_TABLET | Freq: Three times a day (TID) | ORAL | Status: DC | PRN
  Administered 2018-07-07: 5 mg via ORAL
  Filled 2018-07-06: qty 1

## 2018-07-06 MED ORDER — METHYLPREDNISOLONE SODIUM SUCC 125 MG IJ SOLR
60.0000 mg | Freq: Two times a day (BID) | INTRAMUSCULAR | Status: DC
  Administered 2018-07-06 (×2): 60 mg via INTRAVENOUS
  Filled 2018-07-06 (×2): qty 2

## 2018-07-06 MED ORDER — OXYCODONE-ACETAMINOPHEN 5-325 MG PO TABS
1.0000 | ORAL_TABLET | Freq: Three times a day (TID) | ORAL | Status: DC | PRN
  Administered 2018-07-06 – 2018-07-07 (×2): 1 via ORAL
  Filled 2018-07-06 (×2): qty 1

## 2018-07-06 MED ORDER — DOXYCYCLINE HYCLATE 100 MG PO TABS
100.0000 mg | ORAL_TABLET | Freq: Two times a day (BID) | ORAL | Status: DC
  Administered 2018-07-06 – 2018-07-07 (×3): 100 mg via ORAL
  Filled 2018-07-06 (×3): qty 1

## 2018-07-06 MED ORDER — PREDNISONE 50 MG PO TABS: 50.0000 mg | ORAL_TABLET | Freq: Every day | ORAL | Status: DC

## 2018-07-06 NOTE — Care Management Note (Signed)
Case Management Note  Patient Details  Name: Kristin [name] MRN: [id number] Date of Birth: [birth year]  Subjective/Objective:     Patient is from home with daughter.  She has been admitted with acute on chronic respiratory failure, CHF and COPD.  She uses 2L chronic O2 at home.  She gets her O2 through [facility].  Current with PCP, Dr. Rock Nephew in [place].  Has a HF clinic appointment.  Daughter is able to take her to appointments as needed.  Patient has a cane and a walker.  No equipment needs at this time.   .             Action/Plan:  She does not have a scale.  This RNCM provided her with one as she states she cannot afford on right now.  Provided Living Better with Heart Failure Booklet as well.  She is ambulating without difficulty.  Ordered a dietitian consult for CHF education.  Provided [email or web address] list.  Offered HH services and patient would like [facility] for [name], RN, PT; referral accepted by [name].  Will notify her at DC.   Expected Discharge Date:  07/06/18               Expected Discharge Plan:  [place]  In-House Referral:     Discharge planning Services  CM Consult  Post Acute Care Choice:  Home Health Choice offered to:  Patient  DME Arranged:    DME Agency:     HH Arranged:  RN, PT(ReDs Vest) [place]:  [facility] (now [facility])  Status of Service:  Completed, signed off  If discussed at [employer] of Stay Meetings, dates discussed:    Additional Comments:  Elza Rafter, RN 07/06/2018, 4:23 PM

## 2018-07-06 NOTE — Progress Notes (Signed)
Found patient with O2 off several times overnight, after complaining of SOB; encourage patient to keep O2 on at all times, especially when ambulating. Voiced understanding; Barbaraann Faster, RN 7:06 AM; 07/06/2018

## 2018-07-06 NOTE — Progress Notes (Signed)
[facility] [place] NAME: Kristin [name]    MR#:  [id number]  DATE OF BIRTH:  26-Dec-1950  SUBJECTIVE:   Patient states she is having increased wheezing today.  She feels like the breathing treatments are helping, and that they are "shaking things loose".  She feels like her shortness of breath is little bit better.  She endorses cough.  REVIEW OF SYSTEMS:  Review of Systems  Constitutional: Negative for chills and fever.  HENT: Negative for congestion and sore throat.   Eyes: Negative for blurred vision and double vision.  Respiratory: Positive for shortness of breath and wheezing. Negative for cough.   Cardiovascular: Negative for chest pain and palpitations.  Gastrointestinal: Negative for nausea and vomiting.  Genitourinary: Negative for dysuria and urgency.  Musculoskeletal: Negative for back pain and neck pain.  Neurological: Negative for dizziness and headaches.  Psychiatric/Behavioral: Negative for depression. The patient is not nervous/anxious.     DRUG ALLERGIES:   Allergies  Allergen Reactions  . Altace [Ramipril] Swelling   VITALS:  Blood pressure 139/73, pulse 91, temperature 98.3 F (36.8 C), temperature source Oral, resp. rate 18, height 5\' 3"  (1.6 m), weight 109.4 kg, SpO2 94 %. PHYSICAL EXAMINATION:  Physical Exam  GENERAL:  68 y.o.-year-old patient lying in the bed with no acute distress.  Morbidly obese, nontoxic-appearing EYES: Pupils equal, round, reactive to light and accommodation. No scleral icterus. Extraocular muscles intact.  HEENT: Head atraumatic, normocephalic. Oropharynx and nasopharynx clear.  NECK:  Supple, no jugular venous distention. No thyroid enlargement, no tenderness.  LUNGS: + Diffuse inspiratory and expiratory wheezing present.  + Bibasilar crackles present.  No use of accessory muscles of respiration.  CARDIOVASCULAR: RRR, S1, S2 normal. No murmurs, rubs, or gallops.  ABDOMEN: Soft,  nontender, nondistended. Bowel sounds present. No organomegaly or mass.  EXTREMITIES: No pedal edema, cyanosis, or clubbing.  NEUROLOGIC: Cranial nerves II through XII are intact. Muscle strength 5/5 in all extremities. Sensation intact. Gait not checked.  PSYCHIATRIC: The patient is alert and oriented x 3.  SKIN: No obvious rash, lesion, or ulcer.  LABORATORY PANEL:  Female CBC Recent Labs  Lab 07/06/18 0440  WBC 11.5*  HGB 11.8*  HCT 39.1  PLT 239   ------------------------------------------------------------------------------------------------------------------ Chemistries  Recent Labs  Lab 07/04/18 1535  07/06/18 0440  NA 139   < > 139  K 4.3   < > 3.8  CL 100   < > 97*  CO2 33*   < > 36*  GLUCOSE 137*   < > 104*  BUN 16   < > 32*  CREATININE 0.84   < > 0.98  CALCIUM 8.7*   < > 8.5*  AST 14*  --   --   ALT 10  --   --   ALKPHOS 37*  --   --   BILITOT 0.4  --   --    < > = values in this interval not displayed.   RADIOLOGY:  No results found. ASSESSMENT AND PLAN:   Acute on chronic hypoxic respiratory failure- likely due to COPD, CHF, OSA. -Stable on home 3 L of oxygen -CPAP at bedtime  Acute on COPD/asthmatic exacerbation- wheezing seems a little bit worse today, and patient was short of breath with movement. -Continue IV Solu-Medrol, will consider transitioning to prednisone tomorrow -Continue doxycycline- switch from IV to p.o. -DuoNebs PRN -Continue home inhalers  Acute suspected CHF exacerbation- x-ray with edema and small bilateral  pleural effusions.  BNP mildly elevated to 216. -Continue Lasix 20 mg IV twice daily -Continue Coreg -Hold lisinopril due to soft blood pressures this morning -Echo with EF 60 to 45% and no diastolic dysfunction -Strict I/O, daily weights  Acute on chronic tobacco smoker abuse/dependency -Nicotine patch and cessation counseling provided  Type 2 diabetes- blood sugars much improved. -Continue Lantus 20 units daily and  SSI  Obstructive sleep apnea -CPAP qhs  All the records are reviewed and case discussed with Care Management/Social Worker. Management plans discussed with the patient, family and they are in agreement.  CODE STATUS: Full Code  TOTAL TIME TAKING CARE OF THIS PATIENT: 35 minutes.   More than 50% of the time was spent in counseling/coordination of care: YES  POSSIBLE D/C IN 1-2 DAYS, DEPENDING ON CLINICAL CONDITION.   Berna Spare Deagan Sevin M.D on 07/06/2018 at 6:14 PM  Between 7am to 6pm - Pager [phone]  After 6pm go to [email or web address] - Technical brewer [place] [facility]  Office  [phone]  CC: Primary care physician; Suzan Garibaldi, FNP  Note: This dictation was prepared with Dragon dictation along with smaller phrase technology. Any transcriptional errors that result from this process are unintentional.

## 2018-07-07 LAB — BASIC METABOLIC PANEL
Anion gap: 8 (ref 5–15)
BUN: 30 mg/dL — ABNORMAL HIGH (ref 8–23)
CO2: 37 mmol/L — ABNORMAL HIGH (ref 22–32)
Calcium: 8.9 mg/dL (ref 8.9–10.3)
Chloride: 95 mmol/L — ABNORMAL LOW (ref 98–111)
Creatinine, Ser: 0.73 mg/dL (ref 0.44–1.00)
GFR calc Af Amer: >60 mL/min (ref >60)
GFR calc non Af Amer: >60 mL/min (ref >60)
Glucose, Bld: 148 mg/dL — ABNORMAL HIGH (ref 70–99)
Potassium: 4.1 mmol/L (ref 3.5–5.1)
Sodium: 140 mmol/L (ref 135–145)

## 2018-07-07 LAB — CBC
HCT: 40.7 % (ref 36.0–46.0)
Hemoglobin: 12.4 g/dL (ref 12.0–15.0)
MCH: 26.8 pg (ref 26.0–34.0)
MCHC: 30.5 g/dL (ref 30.0–36.0)
MCV: 88.1 fL (ref 80.0–100.0)
Platelets: 259 10*3/uL (ref 150–400)
RBC: 4.62 MIL/uL (ref 3.87–5.11)
RDW: 14 % (ref 11.5–15.5)
WBC: 9.8 10*3/uL (ref 4.0–10.5)
nRBC: 0 % (ref 0.0–0.2)

## 2018-07-07 LAB — GLUCOSE, CAPILLARY
Glucose-Capillary: 133 mg/dL — ABNORMAL HIGH (ref 70–99)
Glucose-Capillary: 89 mg/dL (ref 70–99)

## 2018-07-07 MED ORDER — FUROSEMIDE 20 MG PO TABS: 20.0000 mg | ORAL_TABLET | Freq: Every day | ORAL | 0 refills | Status: DC

## 2018-07-07 MED ORDER — DOXYCYCLINE HYCLATE 100 MG PO TABS: 100.0000 mg | ORAL_TABLET | Freq: Two times a day (BID) | ORAL | 0 refills | Status: DC

## 2018-07-07 MED ORDER — CARVEDILOL 3.125 MG PO TABS: 3.1250 mg | ORAL_TABLET | Freq: Two times a day (BID) | ORAL | 0 refills | Status: DC

## 2018-07-07 MED ORDER — PREDNISONE 50 MG PO TABS: 50.0000 mg | ORAL_TABLET | Freq: Every day | ORAL | 0 refills | Status: DC

## 2018-07-07 NOTE — Progress Notes (Signed)
Patient declined CPAP at this time. On 2L [place] resting comfortably in bed

## 2018-07-07 NOTE — Discharge Summary (Signed)
[facility] at [place] NAME: Kristin [name]    MR#:  [id number]  DATE OF BIRTH:  [birth year]  DATE OF ADMISSION:  07/04/2018   ADMITTING PHYSICIAN: Gorden Harms, MD  DATE OF DISCHARGE: 07/07/18  PRIMARY CARE PHYSICIAN: Suzan Garibaldi, FNP   ADMISSION DIAGNOSIS:  Cough [R05] Hypercarbia [R06.89] Tobacco abuse [Z72.0] Decreased exercise tolerance [R68.89] Acute on chronic respiratory failure with hypoxia (HCC) [J96.21] Acute on chronic congestive heart failure, unspecified heart failure type ([place]) [I50.9] DISCHARGE DIAGNOSIS:  Active Problems:   Respiratory failure ([place])  SECONDARY DIAGNOSIS:   Past Medical History:  Diagnosis Date  . Anxiety   . Asthma   . CHF (congestive heart failure) ([place])    2018  . COPD (chronic obstructive pulmonary disease) ([place])   . Diabetes mellitus without complication ([place])   . Dyspnea   . GERD (gastroesophageal reflux disease)   . History of hiatal hernia   . History of orthopnea   . Hypothyroidism   . Neuropathy   . Oxygen deficiency    3L/HS  . Pain    BACK/DDD  . Peripheral vascular disease ([place])   . RLS (restless legs syndrome)   . Sleep apnea   . Wheezing    HOSPITAL COURSE:   Kristin [name] is a 68 year old female who presented to the ED with shortness of breath.  In the ED, she was requiring 3 L of oxygen (uses 2 L O2 chronically at home).  She was felt to have COPD exacerbation, with possible CHF exacerbation.  She was admitted for further management.  Acute on chronic hypoxic respiratory failure- likely due to COPD, CHF, OSA. -Able to ambulate around the room on the day of discharge without any shortness of breath -Stable on home 2 L of oxygen -CPAP at bedtime  Acute COPD exacerbation- improved -Treated with Solu-Medrol and then transitioned to prednisone for a total 5-day course -Treated with doxycycline for a total 5-day course -Continued home inhalers  Pulmonary edema- x-ray in  the ED with edema and small bilateral pleural effusions.  BNP mildly elevated to 216. -Echo showed normal systolic and diastolic function, but with mildly elevated BNP and x-ray with pulmonary edema, would favor continued treatment with Lasix. -Treated with IV Lasix, then transitioned to Lasix 20 mg p.o. daily.  Can discontinue this as an outpatient if no longer needed. -Started on Coreg this admission -Continued lisinopril -Discharged home with home health heart failure protocol  Tobacco use -Nicotine patch and cessation counseling provided  Type 2 diabetes- blood sugars much improved. -Home diabetes meds continued on discharge  Obstructive sleep apnea -CPAP qhs  Hyperlipidemia -Continued statin  DISCHARGE CONDITIONS:  COPD Pulmonary edema Tobacco use Type 2 diabetes OSA CONSULTS OBTAINED:  None DRUG ALLERGIES:   Allergies  Allergen Reactions  . Altace [Ramipril] Swelling   DISCHARGE MEDICATIONS:   Allergies as of 07/07/2018      Reactions   Altace [ramipril] Swelling      Medication List    TAKE these medications   aspirin EC 81 MG tablet Take 81 mg by mouth daily.   atorvastatin 40 MG tablet Commonly known as:  LIPITOR Take 40 mg by mouth daily.   CALCIUM 600+D3 PO Take 1 tablet by mouth daily.   carvedilol 3.125 MG tablet Commonly known as:  COREG Take 1 tablet (3.125 mg total) by mouth 2 (two) times daily with a meal.   citalopram 40 MG tablet Commonly known as:  CELEXA Take  40 mg by mouth daily.   cyanocobalamin 1000 MCG/ML injection Commonly known as:  (VITAMIN B-12) Inject 1,000 mcg into the muscle every 30 (thirty) days.   doxycycline 100 MG tablet Commonly known as:  VIBRA-TABS Take 1 tablet (100 mg total) by mouth every 12 (twelve) hours.   fenofibrate 160 MG tablet Take 160 mg by mouth daily.   furosemide 20 MG tablet Commonly known as:  LASIX Take 1 tablet (20 mg total) by mouth daily.   gabapentin 400 MG capsule Commonly  known as:  NEURONTIN Take 800 mg by mouth at bedtime.   levothyroxine 75 MCG tablet Commonly known as:  SYNTHROID, LEVOTHROID Take 75 mcg by mouth daily before breakfast.   lisinopril 20 MG tablet Commonly known as:  PRINIVIL,ZESTRIL Take 20 mg by mouth daily.   oxyCODONE-acetaminophen 10-325 MG tablet Commonly known as:  PERCOCET Take 1 tablet by mouth 4 (four) times daily.   OXYGEN Inhale 3 L into the lungs.   predniSONE 50 MG tablet Commonly known as:  DELTASONE Take 1 tablet (50 mg total) by mouth daily. Start taking on:  July 08, 2018   PRESERVISION AREDS PO Take 1 tablet by mouth daily.   PROAIR HFA 108 (90 Base) MCG/ACT inhaler Generic drug:  albuterol Inhale 2 puffs into the lungs every 6 (six) hours as needed for wheezing or shortness of breath.   sitaGLIPtin 100 MG tablet Commonly known as:  JANUVIA Take 100 mg by mouth daily.   SPIRIVA RESPIMAT 1.25 MCG/ACT Aers Generic drug:  Tiotropium Bromide Monohydrate Inhale 1 puff into the lungs 2 (two) times daily.   ZANTAC 150 MG tablet Generic drug:  ranitidine Take 150 mg by mouth 2 (two) times daily.        DISCHARGE INSTRUCTIONS:  1.  Follow-up with PCP in 5 days 2.  Take prednisone 50 mg daily for total 5-day course 3.  Take doxycycline twice daily for a total 5-day course 4.  Take Coreg 3.125 mg twice daily 5.  Take Lasix 20 mg daily, can stop as an outpatient if no longer needed 6.  Recheck BMP as an outpatient DIET:  Cardiac diet and Diabetic diet DISCHARGE CONDITION:  Stable ACTIVITY:  Activity as tolerated OXYGEN:  Home Oxygen: Yes.    Oxygen Delivery: 2 liters/min via Patient connected to nasal cannula oxygen DISCHARGE LOCATION:  home   If you experience worsening of your admission symptoms, develop shortness of breath, life threatening emergency, suicidal or homicidal thoughts you must seek medical attention immediately by calling 911 or calling your MD immediately  if symptoms less  severe.  You Must read complete instructions/literature along with all the possible adverse reactions/side effects for all the Medicines you take and that have been prescribed to you. Take any new Medicines after you have completely understood and accpet all the possible adverse reactions/side effects.   Please note  You were cared for by a hospitalist during your hospital stay. If you have any questions about your discharge medications or the care you received while you were in the hospital after you are discharged, you can call the unit and asked to speak with the hospitalist on call if the hospitalist that took care of you is not available. Once you are discharged, your primary care physician will handle any further medical issues. Please note that NO REFILLS for any discharge medications will be authorized once you are discharged, as it is imperative that you return to your primary care physician (or establish a  relationship with a primary care physician if you do not have one) for your aftercare needs so that they can reassess your need for medications and monitor your lab values.    On the day of Discharge:  VITAL SIGNS:  Blood pressure 124/65, pulse 75, temperature 97.9 F (36.6 C), temperature source Oral, resp. rate 18, height 5\' 3"  (1.6 m), weight 109.2 kg, SpO2 94 %. PHYSICAL EXAMINATION:  GENERAL:  68 y.o.-year-old patient lying in the bed with no acute distress.  EYES: Pupils equal, round, reactive to light and accommodation. No scleral icterus. Extraocular muscles intact.  HEENT: Head atraumatic, normocephalic. Oropharynx and nasopharynx clear.  NECK:  Supple, no jugular venous distention. No thyroid enlargement, no tenderness.  LUNGS: + Faint expiratory wheezing, improved breath sounds throughout all lung fields.  No use of accessory muscles of respiration.  CARDIOVASCULAR: RRR, S1, S2 normal. No murmurs, rubs, or gallops.  ABDOMEN: Soft, non-tender, non-distended. Bowel sounds  present. No organomegaly or mass.  EXTREMITIES: No pedal edema, cyanosis, or clubbing.  NEUROLOGIC: Cranial nerves II through XII are intact. Muscle strength 5/5 in all extremities. Sensation intact. Gait not checked.  PSYCHIATRIC: The patient is alert and oriented x 3.  SKIN: No obvious rash, lesion, or ulcer.  DATA REVIEW:   CBC Recent Labs  Lab 07/07/18 0614  WBC 9.8  HGB 12.4  HCT 40.7  PLT 259    Chemistries  Recent Labs  Lab 07/04/18 1535  07/07/18 0614  NA 139   < > 140  K 4.3   < > 4.1  CL 100   < > 95*  CO2 33*   < > 37*  GLUCOSE 137*   < > 148*  BUN 16   < > 30*  CREATININE 0.84   < > 0.73  CALCIUM 8.7*   < > 8.9  AST 14*  --   --   ALT 10  --   --   ALKPHOS 37*  --   --   BILITOT 0.4  --   --    < > = values in this interval not displayed.     Microbiology Results  Results for orders placed or performed during the hospital encounter of 07/04/18  Urine culture     Status: None   Collection Time: 07/04/18  7:54 PM  Result Value Ref Range Status   Specimen Description   Final    URINE, RANDOM Performed at [facility], [address], [place], [place] [address]    Special Requests   Final    NONE Performed at [facility], [address] [place], [place] [address]    Culture   Final    NO GROWTH Performed at [facility], [place] [address], [place], [place] [address]    Report Status 07/06/2018 FINAL  Final    RADIOLOGY:  No results found.   Management plans discussed with the patient, family and they are in agreement.  CODE STATUS: Full Code   TOTAL TIME TAKING CARE OF THIS PATIENT: 35 minutes.    Berna Spare Mayo M.D on 07/07/2018 at 12:23 PM  Between 7am to 6pm - Pager [phone]  After 6pm go to [email or web address] - Technical brewer [facility]  Office  [phone]  CC: Primary care physician; Suzan Garibaldi, FNP   Note: This dictation was prepared with Dragon dictation along  with smaller phrase technology. Any transcriptional errors that result from this process are unintentional.

## 2018-07-07 NOTE — Discharge Instructions (Signed)
It was so nice to meet you during this hospitalization!  You came into the hospital with shortness of breath. We think this was due to your COPD and to having some fluid in your lungs.  I have prescribed the following medications: 1. Take Coreg 3.125mg  twice daily (for your heart) 2. Take Lasix 20mg  daily (this is a fluid pill to help keep fluid off of your lungs)- your primary care doctor may choose to stop this in the future 3. Take Doxycycline twice a day (starting tonight) for 2 more days 4. Take prednisone 50mg  daily with breakfast for 1 more day (starting tomorrow)  Take care, Dr. Brett Albino

## 2018-07-07 NOTE — Progress Notes (Signed)
Patient continues to get OOB without assistance (alarm sounding), asking for drinks, wanting to eat, moving bucket out of toilet; re-enforced to patient that she was on a Carb Modified diet, that she was also on fluid restrictions and that she was a high falls risk and to call for assistance to BR, that she could not eat all night, that she was to leave bucket in toilet and couldn't drink however much she wanted; will consult dietician for small frequent snacks; acknowledged education. Barbaraann Faster, RN 5:55 AM 07/07/2018

## 2018-07-07 NOTE — Plan of Care (Signed)
Nutrition Education Note  RD consulted for nutrition education regarding CHF.  68 y.o. female with a known history per below, noted history of COPD/asthma, obstructive sleep apnea-noncompliant with CPAP, presenting with 2-week history of intermittent shortness of breath, dyspnea on exertion, and intermittent cough  RD provided "Low Sodium Nutrition Therapy" handout from the Academy of Nutrition and Dietetics. Reviewed patient's dietary recall. Provided examples on ways to decrease sodium intake in diet. Discouraged intake of processed foods and use of salt shaker. Encouraged fresh fruits and vegetables as well as whole grain sources of carbohydrates to maximize fiber intake.   RD discussed why it is important for patient to adhere to diet recommendations, and emphasized the role of fluids, foods to avoid, and importance of weighing self daily. Teach back method used.  Expect fair compliance.  Body mass index is 42.66 kg/m. Pt meets criteria for morbid obesity based on current BMI.  Current diet order is HH, patient is consuming approximately 100% of meals at this time. Labs and medications reviewed. No further nutrition interventions warranted at this time. RD contact information provided. If additional nutrition issues arise, please re-consult RD.   Koleen Distance MS, RD, LDN Pager #- [phone] Office#- [phone] After Hours Pager: [phone]

## 2018-07-07 NOTE — Care Management Note (Signed)
Case Management Note  Patient Details  Name: Kristin [name] MRN: [id number] Date of Birth: [birth year]   [name] with [facility] notified of discharge  Subjective/Objective:                    Action/Plan:   Expected Discharge Date:  07/07/18               Expected Discharge Plan:  [place]  In-House Referral:     Discharge planning Services  CM Consult  Post Acute Care Choice:  Home Health Choice offered to:  Patient  DME Arranged:    DME Agency:     HH Arranged:  RN, PT(ReDs Vest) [place]:  [facility] (now [facility])  Status of Service:  Completed, signed off  If discussed at [employer] of Stay Meetings, dates discussed:    Additional Comments:  Beverly Sessions, RN 07/07/2018, 11:21 AM

## 2018-07-12 ENCOUNTER — Ambulatory Visit: Payer: Medicare Other | Attending: Family | Admitting: Family

## 2018-07-12 ENCOUNTER — Encounter: Payer: Self-pay | Admitting: Family

## 2018-07-12 ENCOUNTER — Encounter: Payer: Self-pay | Admitting: Pharmacist

## 2018-07-12 DIAGNOSIS — F419 Anxiety disorder, unspecified: Secondary | ICD-10-CM | POA: Insufficient documentation

## 2018-07-12 DIAGNOSIS — Z7982 Long term (current) use of aspirin: Secondary | ICD-10-CM | POA: Diagnosis not present

## 2018-07-12 DIAGNOSIS — I959 Hypotension, unspecified: Secondary | ICD-10-CM | POA: Insufficient documentation

## 2018-07-12 DIAGNOSIS — E039 Hypothyroidism, unspecified: Secondary | ICD-10-CM | POA: Diagnosis not present

## 2018-07-12 DIAGNOSIS — E1151 Type 2 diabetes mellitus with diabetic peripheral angiopathy without gangrene: Secondary | ICD-10-CM | POA: Insufficient documentation

## 2018-07-12 DIAGNOSIS — J449 Chronic obstructive pulmonary disease, unspecified: Secondary | ICD-10-CM

## 2018-07-12 DIAGNOSIS — Z79899 Other long term (current) drug therapy: Secondary | ICD-10-CM | POA: Insufficient documentation

## 2018-07-12 DIAGNOSIS — K219 Gastro-esophageal reflux disease without esophagitis: Secondary | ICD-10-CM | POA: Diagnosis not present

## 2018-07-12 DIAGNOSIS — J441 Chronic obstructive pulmonary disease with (acute) exacerbation: Secondary | ICD-10-CM | POA: Insufficient documentation

## 2018-07-12 DIAGNOSIS — Z7901 Long term (current) use of anticoagulants: Secondary | ICD-10-CM | POA: Diagnosis not present

## 2018-07-12 DIAGNOSIS — R42 Dizziness and giddiness: Secondary | ICD-10-CM | POA: Insufficient documentation

## 2018-07-12 DIAGNOSIS — G2581 Restless legs syndrome: Secondary | ICD-10-CM | POA: Diagnosis not present

## 2018-07-12 DIAGNOSIS — R5383 Other fatigue: Secondary | ICD-10-CM | POA: Diagnosis present

## 2018-07-12 DIAGNOSIS — G4733 Obstructive sleep apnea (adult) (pediatric): Secondary | ICD-10-CM | POA: Insufficient documentation

## 2018-07-12 DIAGNOSIS — Z9981 Dependence on supplemental oxygen: Secondary | ICD-10-CM | POA: Diagnosis not present

## 2018-07-12 DIAGNOSIS — F17210 Nicotine dependence, cigarettes, uncomplicated: Secondary | ICD-10-CM | POA: Insufficient documentation

## 2018-07-12 DIAGNOSIS — I5032 Chronic diastolic (congestive) heart failure: Secondary | ICD-10-CM | POA: Insufficient documentation

## 2018-07-12 DIAGNOSIS — Z720 Tobacco use: Secondary | ICD-10-CM | POA: Insufficient documentation

## 2018-07-12 DIAGNOSIS — I952 Hypotension due to drugs: Secondary | ICD-10-CM

## 2018-07-12 DIAGNOSIS — E1140 Type 2 diabetes mellitus with diabetic neuropathy, unspecified: Secondary | ICD-10-CM | POA: Insufficient documentation

## 2018-07-12 DIAGNOSIS — E119 Type 2 diabetes mellitus without complications: Secondary | ICD-10-CM

## 2018-07-12 MED ORDER — FUROSEMIDE 20 MG PO TABS: 20.0000 mg | ORAL_TABLET | ORAL | 0 refills | Status: DC

## 2018-07-12 NOTE — Progress Notes (Signed)
Patient ID: Kristin [name], female    DOB: November 10, 1950, 68 y.o.   MRN: [id number]  HPI  [name] is a 68 y/o female with a history of asthma, DM, thyroid disease, COPD, obstructive sleep apnea, GERD, anxiety, PVD, current tobacco use and chronic heart failure.   Echo report from 07/05/2018 reviewed and showed an EF of 60-65% along with moderate AS.   Admitted 07/04/2018 due to acute COPD / HF exacerbation. Given solu-medrol and then transitioned to prednisone. Initially needed IV lasix and then changed to oral diuretics. Discharged after 3 days.   She presents today for her initial visit with a chief complaint of moderate fatigue upon minimal exertion. She describes this as chronic in nature having been present for several years. She has associated shortness of breath, light-headedness and difficulty sleeping along with this. She denies any abdominal distention, palpitations, pedal edema, chest pain or weight gain.   Past Medical History:  Diagnosis Date  . Anxiety   . Asthma   . CHF (congestive heart failure) ([place])    2018  . COPD (chronic obstructive pulmonary disease) ([place])   . Diabetes mellitus without complication ([place])   . Dyspnea   . GERD (gastroesophageal reflux disease)   . History of hiatal hernia   . History of orthopnea   . Hypothyroidism   . Neuropathy   . Oxygen deficiency    3L/HS  . Pain    BACK/DDD  . Peripheral vascular disease ([place])   . RLS (restless legs syndrome)   . Sleep apnea   . Wheezing    Past Surgical History:  Procedure Laterality Date  . BREAST SURGERY    . CATARACT EXTRACTION W/PHACO Right 03/02/2018   Procedure: CATARACT EXTRACTION PHACO AND INTRAOCULAR LENS PLACEMENT ([place]);  Surgeon: Marchia Meiers, MD;  Location: [facility] ORS;  Service: Ophthalmology;  Laterality: Right;  [place] 01:15 CDE 16.46 Fluid pack lot # [id number]  . CATARACT EXTRACTION W/PHACO Left 04/27/2018   Procedure: CATARACT EXTRACTION PHACO AND INTRAOCULAR LENS PLACEMENT ([place])- LEFT  DIABETIC;  Surgeon: Marchia Meiers, MD;  Location: [facility] ORS;  Service: Ophthalmology;  Laterality: Left;  Lot # [id number] H [place]: 00:46.3 CDE: 8.07   . CYST EXCISION     FOREHEAD  . FOOT SURGERY     CYST  . TUBAL LIGATION     No family history on file. Social History   Tobacco Use  . Smoking status: Current Every Day Smoker  . Smokeless tobacco: Never Used  Substance Use Topics  . Alcohol use: Yes   Allergies  Allergen Reactions  . Altace [Ramipril] Swelling   Prior to Admission medications   Medication Sig Start Date End Date Taking? Authorizing Provider  albuterol (PROAIR HFA) 108 (90 Base) MCG/ACT inhaler Inhale 2 puffs into the lungs every 6 (six) hours as needed for wheezing or shortness of breath.   Yes [provider]  aspirin EC 81 MG tablet Take 81 mg by mouth daily.   Yes [provider]  atorvastatin (LIPITOR) 40 MG tablet Take 40 mg by mouth daily.   Yes [provider]  Calcium Carb-Cholecalciferol (CALCIUM 600+D3 PO) Take 1 tablet by mouth daily.   Yes [provider]  carvedilol (COREG) 3.125 MG tablet Take 1 tablet (3.125 mg total) by mouth 2 (two) times daily with a meal. 07/07/18  Yes Mayo, Pete Pelt, MD  citalopram (CELEXA) 40 MG tablet Take 40 mg by mouth daily.   Yes [provider]  cyanocobalamin (,VITAMIN B-12,) 1000  MCG/ML injection Inject 1,000 mcg into the muscle every 30 (thirty) days.   Yes [provider]  fenofibrate 160 MG tablet Take 160 mg by mouth daily.   Yes [provider]  furosemide (LASIX) 20 MG tablet Take 1 tablet (20 mg total) by mouth daily. 07/07/18  Yes Mayo, Pete Pelt, MD  gabapentin (NEURONTIN) 400 MG capsule Take 800 mg by mouth at bedtime.    Yes [provider]  levothyroxine (SYNTHROID, LEVOTHROID) 75 MCG tablet Take 75 mcg by mouth daily before breakfast.   Yes [provider]  lisinopril (PRINIVIL,ZESTRIL) 20 MG tablet Take 20 mg by mouth daily.   Yes  [provider]  Multiple Vitamins-Minerals (PRESERVISION AREDS PO) Take 1 tablet by mouth daily.    Yes [provider]  oxyCODONE-acetaminophen (PERCOCET) 10-325 MG tablet Take 1 tablet by mouth 4 (four) times daily.    Yes [provider]  OXYGEN Inhale 3 L into the lungs.   Yes [provider]  ranitidine (ZANTAC) 150 MG tablet Take 150 mg by mouth 2 (two) times daily.   Yes [provider]  sitaGLIPtin (JANUVIA) 100 MG tablet Take 100 mg by mouth daily.   Yes [provider]  Tiotropium Bromide Monohydrate (SPIRIVA RESPIMAT) 1.25 MCG/ACT AERS Inhale 1 puff into the lungs 2 (two) times daily.    Yes [provider]    Review of Systems  Constitutional: Positive for fatigue (with minimal exertion). Negative for appetite change.  HENT: Negative for congestion, rhinorrhea and sore throat.   Eyes: Negative.   Respiratory: Positive for shortness of breath (with minimal exertion). Negative for chest tightness.   Cardiovascular: Negative for chest pain, palpitations and leg swelling.  Gastrointestinal: Negative for abdominal distention and abdominal pain.  Endocrine: Negative.   Genitourinary: Negative.   Musculoskeletal: Positive for back pain. Negative for neck pain.  Skin: Negative.   Allergic/Immunologic: Negative.   Neurological: Positive for light-headedness. Negative for dizziness.  Hematological: Negative for adenopathy. Does not bruise/bleed easily.  Psychiatric/Behavioral: Positive for sleep disturbance (trouble falling asleep). Negative for dysphoric mood. The patient is not nervous/anxious.    Vitals:   07/12/18 1308  BP: (!) 98/47  Pulse: 81  Resp: 18  SpO2: 96%  Weight: 237 lb 6 oz (107.7 kg)  Height: 5\' 3"  (1.6 m)   Wt Readings from Last 3 Encounters:  07/12/18 237 lb 6 oz (107.7 kg)  07/07/18 240 lb 12.8 oz (109.2 kg)  04/27/18 231 lb 0.7 oz (104.8 kg)   Lab Results  Component Value Date   CREATININE  0.73 07/07/2018   CREATININE 0.98 07/06/2018   CREATININE 0.75 07/05/2018    Physical Exam Vitals signs and nursing note reviewed.  Constitutional:      Appearance: Normal appearance.  HENT:     Head: Normocephalic and atraumatic.  Neck:     Musculoskeletal: Normal range of motion and neck supple.  Cardiovascular:     Rate and Rhythm: Normal rate and regular rhythm.  Pulmonary:     Effort: Pulmonary effort is normal.     Breath sounds: Rhonchi present. No wheezing or rales.  Abdominal:     General: There is no distension.     Palpations: Abdomen is soft.  Musculoskeletal:        General: No tenderness.     Right lower leg: No edema.     Left lower leg: No edema.  Skin:    General: Skin is warm and dry.  Neurological:  General: No focal deficit present.     Mental Status: She is alert and oriented to person, place, and time.  Psychiatric:        Mood and Affect: Mood normal.        Behavior: Behavior normal.     Assessment & Plan:  1: Chronic heart failure with preserved ejection fraction- - NYHA class III - euvolemic today - weighing daily and she was reminded to call for an overnight weight gain of >2 pounds or a weekly weight gain of >5 pounds - she is not adding salt and is using Mrs Deliah Boston for seasoning. Daughter does the cooking and she has been reading food labels. Reviewed the importance of closely following a 2000mg  sodium diet and written information given to her about this.  - BNP 07/04/2018 was 216.0 - PharmD reconciled medications with the patient - received her flu vaccine for this season  2: DM- - BMP 07/07/2018 reviewed and showed sodium 140, potassium 4.1, creatinine 0.73 and GFR >60  3: COPD- - wearing oxygen at 2L around the clock - encouraged her to speak with PCP about pulmonology referral  4: Tobacco use- - smoking 1/2 ppd of cigarettes daily - cessation discussed for 3 minutes with her  5: Hypotension- - BP low today even on recheck -  will decrease furosemide to 20mg  QOD  Medication list was reviewed.   Return in 6 weeks or sooner for any questions/problems before then.

## 2018-07-12 NOTE — Progress Notes (Signed)
[facility] - PHARMACIST COUNSELING NOTE  ADHERENCE ASSESSMENT  Adherence strategy: nothing currently, but daughter wants her to start using a pill box   Do you ever forget to take your medication? [] Yes (1) [x] No (0)  Do you ever skip doses due to side effects? [] Yes (1) [x] No (0)  Do you have trouble affording your medicines? [] Yes (1) [x] No (0)  Are you ever unable to pick up your medication due to transportation difficulties? [] Yes (1) [x] No (0)  Do you ever stop taking your medications because you don't believe they are helping? [] Yes (1) [x] No (0)  Total score _0______    Recommendations given to patient about increasing adherence: Patient reports rarely missing doses of medications. Her daughter is going to have her start using a pill box to help make it easier also.  Guideline-Directed Medical Therapy/Evidence Based Medicine    ACE/ARB/ARNI: lisinopril 20 mg daily   Beta Blocker: carvedilol 3.125 mg twice daily   Aldosterone Antagonist: none (HFpEF) Diuretic: furosemide 20 mg daily    SUBJECTIVE   HPI: New patient here after recent hospitalization for HF. She was started on carvedilol and furosemide. Weighs herself daily and has started eating better.  Past Medical History:  Diagnosis Date  . Anxiety   . Asthma   . CHF (congestive heart failure) ([place])    2018  . COPD (chronic obstructive pulmonary disease) ([place])   . Diabetes mellitus without complication ([place])   . Dyspnea   . GERD (gastroesophageal reflux disease)   . History of hiatal hernia   . History of orthopnea   . Hypothyroidism   . Neuropathy   . Oxygen deficiency    3L/HS  . Pain    BACK/DDD  . Peripheral vascular disease ([place])   . RLS (restless legs syndrome)   . Sleep apnea   . Wheezing         OBJECTIVE    Vital signs: HR 81, BP 98/47, weight (pounds) 237.6  ECHO: Date 07/05/18, EF 60-65%    BMP Latest Ref Rng & Units 07/07/2018 07/06/2018  07/05/2018  Glucose 70 - 99 mg/dL 148(H) 104(H) 137(H)  BUN 8 - 23 mg/dL 30(H) 32(H) 18  Creatinine 0.44 - 1.00 mg/dL 0.73 0.98 0.75  Sodium 135 - 145 mmol/L 140 139 140  Potassium 3.5 - 5.1 mmol/L 4.1 3.8 4.2  Chloride 98 - 111 mmol/L 95(L) 97(L) 98  CO2 22 - 32 mmol/L 37(H) 36(H) 35(H)  Calcium 8.9 - 10.3 mg/dL 8.9 8.5(L) 8.6(L)    ASSESSMENT 68 year old female with HFpEF. She was discharged from the hospital recently and they started carvedilol and furosemide while there. She complains of some fatigue and a few episodes of light-headedness when sitting up. She reports that she has been eating better and thinks that may also be making her feel light-headed. Weighs herself daily and has decreased sodium in diet. Knows to limit fluid intake so as to not become volume-overloaded. BP today 98/47. Daughter reports checking BP daily and that it usually runs around 140-150s.   PLAN With low BP, would hold off on escalating/adding medication today. Requested patient/daughter bring BP log to next visit so that we can see what BP runs at home. Counseled on maintaining heart healthy diet and to continue weighing herself daily. Reminder to call for weight gain over 2 pounds overnight or 5 pounds in a week. We discussed that carvedilol can sometimes cause fatigue during the first couple of weeks of  starting but that this should improve. Also advised her to move more slowly when changing positions to avoid orthostatic hypotension.    Time spent: 10 minutes  [place], Pharm.D. 07/12/2018 1:34 PM    Current Outpatient Medications:  .  albuterol (PROAIR HFA) 108 (90 Base) MCG/ACT inhaler, Inhale 2 puffs into the lungs every 6 (six) hours as needed for wheezing or shortness of breath., Disp: , Rfl:  .  aspirin EC 81 MG tablet, Take 81 mg by mouth daily., Disp: , Rfl:  .  atorvastatin (LIPITOR) 40 MG tablet, Take 40 mg by mouth daily., Disp: , Rfl:  .  Calcium Carb-Cholecalciferol (CALCIUM 600+D3  PO), Take 1 tablet by mouth daily., Disp: , Rfl:  .  carvedilol (COREG) 3.125 MG tablet, Take 1 tablet (3.125 mg total) by mouth 2 (two) times daily with a meal., Disp: 60 tablet, Rfl: 0 .  citalopram (CELEXA) 40 MG tablet, Take 40 mg by mouth daily., Disp: , Rfl:  .  cyanocobalamin (,VITAMIN B-12,) 1000 MCG/ML injection, Inject 1,000 mcg into the muscle every 30 (thirty) days., Disp: , Rfl:  .  doxycycline (VIBRA-TABS) 100 MG tablet, Take 1 tablet (100 mg total) by mouth every 12 (twelve) hours. (Patient not taking: Reported on 07/12/2018), Disp: 5 tablet, Rfl: 0 .  fenofibrate 160 MG tablet, Take 160 mg by mouth daily., Disp: , Rfl:  .  furosemide (LASIX) 20 MG tablet, Take 1 tablet (20 mg total) by mouth daily., Disp: 30 tablet, Rfl: 0 .  gabapentin (NEURONTIN) 400 MG capsule, Take 800 mg by mouth at bedtime. , Disp: , Rfl:  .  levothyroxine (SYNTHROID, LEVOTHROID) 75 MCG tablet, Take 75 mcg by mouth daily before breakfast., Disp: , Rfl:  .  lisinopril (PRINIVIL,ZESTRIL) 20 MG tablet, Take 20 mg by mouth daily., Disp: , Rfl:  .  Multiple Vitamins-Minerals (PRESERVISION AREDS PO), Take 1 tablet by mouth daily. , Disp: , Rfl:  .  oxyCODONE-acetaminophen (PERCOCET) 10-325 MG tablet, Take 1 tablet by mouth 4 (four) times daily. , Disp: , Rfl:  .  OXYGEN, Inhale 3 L into the lungs., Disp: , Rfl:  .  predniSONE (DELTASONE) 50 MG tablet, Take 1 tablet (50 mg total) by mouth daily. (Patient not taking: Reported on 07/12/2018), Disp: 1 tablet, Rfl: 0 .  ranitidine (ZANTAC) 150 MG tablet, Take 150 mg by mouth 2 (two) times daily., Disp: , Rfl:  .  sitaGLIPtin (JANUVIA) 100 MG tablet, Take 100 mg by mouth daily., Disp: , Rfl:  .  Tiotropium Bromide Monohydrate (SPIRIVA RESPIMAT) 1.25 MCG/ACT AERS, Inhale 1 puff into the lungs 2 (two) times daily. , Disp: , Rfl:    COUNSELING POINTS/CLINICAL PEARLS  Carvedilol (Goal: weight less than 85 kg is 25 mg BID, weight greater than 85 kg is 50 mg BID)  Patient  should avoid activities requiring coordination until drug effects are realized, as drug may cause dizziness.  This drug may cause diarrhea, nausea, vomiting, arthralgia, back pain, myalgia, headache, vision disorder, erectile dysfunction, reduced libido, or fatigue.  Instruct patient to report signs/symptoms of adverse cardiovascular effects such as hypotension (especially in elderly patients), arrhythmias, syncope, palpitations, angina, or edema.  Drug may mask symptoms of hypoglycemia. Advise diabetic patients to carefully monitor blood sugar levels.  Patient should take drug with food.  Advise patient against sudden discontinuation of drug. Lisinopril (Goal: 20 to 40 mg once daily)  This drug may cause nausea, vomiting, dizziness, headache, or angioedema of face, lips, throat, or intestines.  Instruct patient to report signs/symptoms of hypotension, or a persistent cough.  Advise patient against sudden discontinuation of drug. Furosemide  Drug causes sun-sensitivity. Advise patient to use sunscreen and avoid tanning beds. Patient should avoid activities requiring coordination until drug effects are realized, as drug may cause dizziness, vertigo, or blurred vision. This drug may cause hyperglycemia, hyperuricemia, constipation, diarrhea, loss of appetite, nausea, vomiting, purpuric disorder, cramps, spasticity, asthenia, headache, paresthesia, or scaling eczema. Instruct patient to report unusual bleeding/bruising or signs/symptoms of hypotension, infection, pancreatitis, or ototoxicity (tinnitus, hearing impairment). Advise patient to report signs/symptoms of a severe skin reactions (flu-like symptoms, spreading red rash, or skin/mucous membrane blistering) or erythema multiforme. Instruct patient to eat high-potassium foods during drug therapy, as directed by healthcare professional.  Patient should not drink alcohol while taking this drug.   DRUGS TO AVOID IN HEART FAILURE  Drug or Class  Mechanism  Analgesics . NSAIDs . COX-2 inhibitors . Glucocorticoids  Sodium and water retention, increased systemic vascular resistance, decreased response to diuretics   Diabetes Medications . Metformin . Thiazolidinediones o Rosiglitazone ([place]andia) o Pioglitazone (Actos) . DPP4 Inhibitors o Saxagliptin ([place]nglyza) o Sitagliptin (Januvia)   Lactic acidosis Possible calcium channel blockade   Unknown  Antiarrhythmics . Class I  o Flecainide o Disopyramide . Class III o Sotalol . Other o Dronedarone  Negative inotrope, proarrhythmic   Proarrhythmic, beta blockade  Negative inotrope  Antihypertensives . Alpha Blockers o Doxazosin . Calcium Channel Blockers o Diltiazem o Verapamil o Nifedipine . Central Alpha Adrenergics o Moxonidine . Peripheral Vasodilators o Minoxidil  Increases renin and aldosterone  Negative inotrope    Possible sympathetic withdrawal  Unknown  Anti-infective . Itraconazole . Amphotericin B  Negative inotrope Unknown  Hematologic . Anagrelide . Cilostazol   Possible inhibition of PD IV Inhibition of PD III causing arrhythmias  Neurologic/Psychiatric . Stimulants . Anti-Seizure Drugs o Carbamazepine o Pregabalin . Antidepressants o Tricyclics o Citalopram . Parkinsons o Bromocriptine o Pergolide o Pramipexole . Antipsychotics o Clozapine . Antimigraine o Ergotamine o Methysergide . Appetite suppressants . Bipolar o Lithium  Peripheral alpha and beta agonist activity  Negative inotrope and chronotrope Calcium channel blockade  Negative inotrope, proarrhythmic Dose-dependent QT prolongation  Excessive serotonin activity/valvular damage Excessive serotonin activity/valvular damage Unknown  IgE mediated hypersensitivy, calcium channel blockade  Excessive serotonin activity/valvular damage Excessive serotonin activity/valvular damage Valvular damage  Direct myofibrillar degeneration, adrenergic  stimulation  Antimalarials . Chloroquine . Hydroxychloroquine Intracellular inhibition of lysosomal enzymes  Urologic Agents . Alpha Blockers o Doxazosin o Prazosin o Tamsulosin o Terazosin  Increased renin and aldosterone  Adapted from Page RL, et al. "Drugs That May Cause or Exacerbate Heart Failure: A Scientific Statement from the [place]." Circulation 2016; 789:F81-O17. DOI: 10.1161/CIR.0000000000000426   MEDICATION ADHERENCES TIPS AND STRATEGIES 1. Taking medication as prescribed improves patient outcomes in heart failure (reduces hospitalizations, improves symptoms, increases survival) 2. Side effects of medications can be managed by decreasing doses, switching agents, stopping drugs, or adding additional therapy. Please let someone in the [facility] know if you have having bothersome side effects so we can modify your regimen. Do not alter your medication regimen without talking to [place].  3. Medication reminders can help patients remember to take drugs on time. If you are missing or forgetting doses you can try linking behaviors, using pill boxes, or an electronic reminder like an alarm on your phone or an app. Some people can also get automated phone calls as medication reminders.

## 2018-07-12 NOTE — Patient Instructions (Addendum)
Continue weighing daily and call for an overnight weight gain of > 2 pounds or a weekly weight gain of >5 pounds.  Change furosemide (lasix) to 1 tablet every other day.   Ask primary care doctor about referral to pulmonologist (lung doctor)

## 2018-08-29 ENCOUNTER — Ambulatory Visit: Payer: Medicare Other | Admitting: Family

## 2018-10-24 ENCOUNTER — Telehealth: Payer: Self-pay | Admitting: Family

## 2018-10-24 ENCOUNTER — Ambulatory Visit: Payer: Medicare Other | Admitting: Family

## 2018-10-24 NOTE — Telephone Encounter (Signed)
Patient did not show for her [facility] appointment on 10/24/2018. Will attempt to reschedule.

## 2019-08-10 ENCOUNTER — Encounter: Payer: Self-pay | Admitting: Family Medicine

## 2019-10-09 ENCOUNTER — Encounter: Payer: Self-pay | Admitting: Gastroenterology

## 2019-10-09 ENCOUNTER — Telehealth: Payer: Self-pay

## 2019-10-09 ENCOUNTER — Ambulatory Visit (INDEPENDENT_AMBULATORY_CARE_PROVIDER_SITE_OTHER): Payer: Medicare Other | Admitting: Gastroenterology

## 2019-10-09 VITALS — BP 150/62 | HR 93 | Temp 98.3°F | Wt 235.2 lb

## 2019-10-09 DIAGNOSIS — G8929 Other chronic pain: Secondary | ICD-10-CM

## 2019-10-09 DIAGNOSIS — K5904 Chronic idiopathic constipation: Secondary | ICD-10-CM

## 2019-10-09 DIAGNOSIS — R1013 Epigastric pain: Secondary | ICD-10-CM | POA: Diagnosis not present

## 2019-10-09 MED ORDER — OMEPRAZOLE 40 MG PO CPDR: 40.0000 mg | DELAYED_RELEASE_CAPSULE | Freq: Every day | ORAL | 1 refills | Status: DC

## 2019-10-09 NOTE — Telephone Encounter (Signed)
I am also not finding pulmonologist on her chart including care everywhere.  We probably have to call the patient and find out if she has one.  If she does not, we can just get the cardiac clearance, ask her PCP regarding pulmonary clearance  Thanks RV

## 2019-10-09 NOTE — Telephone Encounter (Signed)
Please help me find who the pulmonary provider   is.

## 2019-10-09 NOTE — Telephone Encounter (Signed)
   [facility] Pre-operative Risk Assessment    Request for surgical clearance:  1. What type of surgery is being performed?  Colonoscopy and EGD with 2 day prep   2. When is this surgery scheduled? TBD   3. Are there any medications that need to be held prior to surgery and how long? None   4. Practice name and name of physician performing surgery?  [facility]   5. What is your office phone and fax number? [phone] fax [phone]   6. Anesthesia type (None, local, MAC, general) ? General    Kristin [name] 10/09/2019, 4:02 PM  _________________________________________________________________   (provider comments below)

## 2019-10-09 NOTE — Telephone Encounter (Signed)
Called and left a message for call back  

## 2019-10-09 NOTE — Progress Notes (Signed)
Kristin Darby, MD [address]  [place]  [place], [place] [address]  Main: [phone]  Fax: [phone]    Gastroenterology Consultation  Referring Provider:     Suzan Garibaldi, FNP Primary Care Physician:  Lorelee Market, MD Primary Gastroenterologist:  Dr. Cephas [name] Reason for Consultation:     Epigastric pain, constipation        HPI:   Kristin [name] is a 69 y.o. female referred by Dr. Lorelee Market, MD  for consultation & management of epigastric pain and constipation.  Patient reports chronic history of epigastric pain associated with frequent burping, regurgitation as well as heartburn.  She also reports that her stools are hard although she has bowel movement daily.  Patient is morbidly obese with COPD on 3 L of oxygen, diastolic heart failure, limited ADLs. She denies nausea, vomiting, weight loss, fatigue, melena, rectal bleeding, abdominal bloating Labs revealed normal CBC Patient does smoke daily 1PPD, denies drinking alcohol  NSAIDs: None  Antiplts/Anticoagulants/Anti thrombotics: Aspirin 81 mg daily  GI Procedures: None  Past Medical History:  Diagnosis Date  . Anxiety   . Asthma   . CHF (congestive heart failure) ([place])    2018  . COPD (chronic obstructive pulmonary disease) ([place])   . Diabetes mellitus without complication ([place])   . Dyspnea   . GERD (gastroesophageal reflux disease)   . History of hiatal hernia   . History of orthopnea   . Hypothyroidism   . Neuropathy   . Oxygen deficiency    3L/HS  . Pain    BACK/DDD  . Peripheral vascular disease ([place])   . RLS (restless legs syndrome)   . Sleep apnea   . Wheezing     Past Surgical History:  Procedure Laterality Date  . BREAST SURGERY    . CATARACT EXTRACTION W/PHACO Right 03/02/2018   Procedure: CATARACT EXTRACTION PHACO AND INTRAOCULAR LENS PLACEMENT ([place]);  Surgeon: Marchia Meiers, MD;  Location: [facility] ORS;  Service: Ophthalmology;  Laterality: Right;  [place] 01:15 CDE  16.46 Fluid pack lot # [id number]  . CATARACT EXTRACTION W/PHACO Left 04/27/2018   Procedure: CATARACT EXTRACTION PHACO AND INTRAOCULAR LENS PLACEMENT ([place])- LEFT DIABETIC;  Surgeon: Marchia Meiers, MD;  Location: [facility] ORS;  Service: Ophthalmology;  Laterality: Left;  Lot # [id number] H [place]: 00:46.3 CDE: 8.07   . CYST EXCISION     FOREHEAD  . FOOT SURGERY     CYST  . TUBAL LIGATION     Current Outpatient Medications:  .  albuterol (PROAIR HFA) 108 (90 Base) MCG/ACT inhaler, Inhale 2 puffs into the lungs every 6 (six) hours as needed for wheezing or shortness of breath., Disp: , Rfl:  .  aspirin EC 81 MG tablet, Take 81 mg by mouth daily., Disp: , Rfl:  .  atorvastatin (LIPITOR) 40 MG tablet, Take 40 mg by mouth daily., Disp: , Rfl:  .  Calcium Carb-Cholecalciferol (CALCIUM 600+D3 PO), Take 1 tablet by mouth daily., Disp: , Rfl:  .  carvedilol (COREG) 3.125 MG tablet, Take 1 tablet (3.125 mg total) by mouth 2 (two) times daily with a meal., Disp: 60 tablet, Rfl: 0 .  citalopram (CELEXA) 40 MG tablet, Take 40 mg by mouth daily., Disp: , Rfl:  .  cyanocobalamin (,VITAMIN B-12,) 1000 MCG/ML injection, Inject 1,000 mcg into the muscle every 30 (thirty) days., Disp: , Rfl:  .  fenofibrate 160 MG tablet, Take 160 mg by mouth daily., Disp: , Rfl:  .  furosemide (LASIX) 20 MG  tablet, Take 1 tablet (20 mg total) by mouth every other day., Disp: 30 tablet, Rfl: 0 .  gabapentin (NEURONTIN) 400 MG capsule, Take 800 mg by mouth at bedtime. , Disp: , Rfl:  .  levothyroxine (SYNTHROID, LEVOTHROID) 75 MCG tablet, Take 75 mcg by mouth daily before breakfast., Disp: , Rfl:  .  lisinopril (PRINIVIL,ZESTRIL) 20 MG tablet, Take 20 mg by mouth daily., Disp: , Rfl:  .  Multiple Vitamins-Minerals (PRESERVISION AREDS PO), Take 1 tablet by mouth daily. , Disp: , Rfl:  .  oxyCODONE-acetaminophen (PERCOCET) 10-325 MG tablet, Take 1 tablet by mouth 4 (four) times daily. , Disp: , Rfl:  .  OXYGEN, Inhale 3 L into the lungs.,  Disp: , Rfl:  .  ranitidine (ZANTAC) 150 MG tablet, Take 150 mg by mouth 2 (two) times daily., Disp: , Rfl:  .  sitaGLIPtin (JANUVIA) 100 MG tablet, Take 100 mg by mouth daily., Disp: , Rfl:  .  Tiotropium Bromide Monohydrate (SPIRIVA RESPIMAT) 1.25 MCG/ACT AERS, Inhale 1 puff into the lungs 2 (two) times daily. , Disp: , Rfl:  .  omeprazole (PRILOSEC) 40 MG capsule, Take 1 capsule (40 mg total) by mouth daily before breakfast., Disp: 30 capsule, Rfl: 1    History reviewed. No pertinent family history.   Social History   Tobacco Use  . Smoking status: Current Every Day Smoker  . Smokeless tobacco: Never Used  Substance Use Topics  . Alcohol use: Yes  . Drug use: Not on file    Allergies as of 10/09/2019 - Review Complete 10/09/2019  Allergen Reaction Noted  . Altace [ramipril] Swelling 03/02/2018    Review of Systems:    All systems reviewed and negative except where noted in HPI.   Physical Exam:  BP (!) 150/62 (BP Location: Left Arm, Patient Position: Sitting, Cuff Size: Normal)   Pulse 93   Temp 98.3 F (36.8 C) (Oral)   Wt 235 lb 4 oz (106.7 kg)   LMP  (LMP Unknown)   BMI 41.67 kg/m  No LMP recorded (lmp unknown). Patient is postmenopausal.  General:   Alert, obese, well-developed, well-nourished, pleasant and cooperative in NAD Head:  Normocephalic and atraumatic. Eyes:  Sclera clear, no icterus.   Conjunctiva pink. Ears:  Normal auditory acuity. Nose:  No deformity, discharge, or lesions. Mouth:  No deformity or lesions,oropharynx pink & moist. Neck:  Supple; no masses or thyromegaly. Lungs:  Respirations even and unlabored.  Clear throughout to auscultation.   No wheezes, crackles, or rhonchi. No acute distress. Heart:  Regular rate and rhythm; no murmurs, clicks, rubs, or gallops. Abdomen:  Normal bowel sounds. Soft, obese, non-tender and non-distended without masses, hepatosplenomegaly or hernias noted.  No guarding or rebound tenderness.   Rectal: Not  performed Msk:  Symmetrical without gross deformities. Good, equal movement & strength bilaterally. Pulses:  Normal pulses noted. Extremities:  No clubbing or edema.  No cyanosis. Neurologic:  Alert and oriented x3;  grossly normal neurologically. Skin:  Intact without significant lesions or rashes. No jaundice. Psych:  Alert and cooperative. Normal mood and affect.  Imaging Studies: No abdominal imaging  Assessment and Plan:   Alecea A Ruby is a 69 y.o. female with metabolic syndrome, tobacco abuse, COPD on 3 L home oxygen, CHF is seen in consultation for chronic epigastric pain and GERD.  Recommend omeprazole 40 mg once a day before breakfast Discussed with patient about risks and benefits of upper endoscopy as well as colonoscopy.  Patient is interested to  undergo these procedures.  Given that she has a complicated medical history, recommend clearance by cardiology as well as pulmonary before proceeding with any endoscopic intervention   Follow up in 2 months   Kristin Darby, MD

## 2019-10-10 NOTE — Telephone Encounter (Signed)
Ok, Dr Brunetta Genera should be made aware of cardiac clearance as well  Thanks RV

## 2019-10-10 NOTE — Telephone Encounter (Signed)
Sent Cardiac Clearance to [facility] which is heart failure clinic. They faxed back and said they do not do the Cardiac clearance

## 2019-10-10 NOTE — Telephone Encounter (Signed)
Patient is not a cardiology patient.  I will have her colonoscopy/EGD request forwarded to her PCP.

## 2019-10-10 NOTE — Telephone Encounter (Signed)
Patient states she has never seen a pulmonologist. Patient states that we have her PCP wrong she sees Dr. Brunetta Genera. Sent clearance to PCP

## 2019-10-11 ENCOUNTER — Telehealth: Payer: Self-pay

## 2019-10-11 DIAGNOSIS — J449 Chronic obstructive pulmonary disease, unspecified: Secondary | ICD-10-CM

## 2019-10-11 DIAGNOSIS — I5032 Chronic diastolic (congestive) heart failure: Secondary | ICD-10-CM

## 2019-10-11 NOTE — Telephone Encounter (Signed)
please go ahead and put in referrals to [facility] health cardiology and pulmonary for preop clearance  Kristin [name]

## 2019-10-11 NOTE — Telephone Encounter (Signed)
Patient PCP Dr. Brunetta Genera cleared patient for Colonoscopy and EGD.He states patient is at moderate risk for procedure. Patient uses home oxygen but is stable with history of COPD. Heart failure clinic stated that they do not do clearance that she has to see a cardiologist.  I believe patient should see cardiology and Pulmonologist  first before we scheduled procedure Please advised

## 2019-10-11 NOTE — Telephone Encounter (Signed)
Placed referral per Dr. Marius Ditch request. Informed patient daughter and she verbalized understanding

## 2019-10-16 ENCOUNTER — Ambulatory Visit: Payer: Medicare Other | Admitting: Cardiology

## 2019-10-17 ENCOUNTER — Encounter: Payer: Self-pay | Admitting: Cardiology

## 2019-11-28 ENCOUNTER — Ambulatory Visit: Payer: Medicare Other | Admitting: Gastroenterology

## 2019-11-28 ENCOUNTER — Other Ambulatory Visit: Payer: Self-pay

## 2020-03-28 ENCOUNTER — Inpatient Hospital Stay
Admission: EM | Admit: 2020-03-28 | Discharge: 2020-04-02 | DRG: 280 | Disposition: A | Discharge status: Home / Self Care | Payer: Medicare HMO | Attending: Family Medicine | Admitting: Family Medicine

## 2020-03-28 ENCOUNTER — Other Ambulatory Visit: Payer: Self-pay

## 2020-03-28 ENCOUNTER — Emergency Department: Payer: Medicare HMO

## 2020-03-28 DIAGNOSIS — T502X5A Adverse effect of carbonic-anhydrase inhibitors, benzothiadiazides and other diuretics, initial encounter: Secondary | ICD-10-CM | POA: Diagnosis present

## 2020-03-28 DIAGNOSIS — I214 Non-ST elevation (NSTEMI) myocardial infarction: Principal | ICD-10-CM | POA: Diagnosis present

## 2020-03-28 DIAGNOSIS — Z7982 Long term (current) use of aspirin: Secondary | ICD-10-CM

## 2020-03-28 DIAGNOSIS — K219 Gastro-esophageal reflux disease without esophagitis: Secondary | ICD-10-CM | POA: Diagnosis present

## 2020-03-28 DIAGNOSIS — Z6841 Body Mass Index (BMI) 40.0 and over, adult: Secondary | ICD-10-CM

## 2020-03-28 DIAGNOSIS — E66813 Obesity, class 3: Secondary | ICD-10-CM

## 2020-03-28 DIAGNOSIS — I5032 Chronic diastolic (congestive) heart failure: Secondary | ICD-10-CM | POA: Diagnosis present

## 2020-03-28 DIAGNOSIS — R079 Chest pain, unspecified: Secondary | ICD-10-CM | POA: Diagnosis not present

## 2020-03-28 DIAGNOSIS — F32A Depression, unspecified: Secondary | ICD-10-CM | POA: Diagnosis present

## 2020-03-28 DIAGNOSIS — F17210 Nicotine dependence, cigarettes, uncomplicated: Secondary | ICD-10-CM | POA: Diagnosis present

## 2020-03-28 DIAGNOSIS — I248 Other forms of acute ischemic heart disease: Secondary | ICD-10-CM

## 2020-03-28 DIAGNOSIS — I110 Hypertensive heart disease with heart failure: Secondary | ICD-10-CM | POA: Diagnosis present

## 2020-03-28 DIAGNOSIS — Z79899 Other long term (current) drug therapy: Secondary | ICD-10-CM

## 2020-03-28 DIAGNOSIS — Z79891 Long term (current) use of opiate analgesic: Secondary | ICD-10-CM

## 2020-03-28 DIAGNOSIS — E1151 Type 2 diabetes mellitus with diabetic peripheral angiopathy without gangrene: Secondary | ICD-10-CM | POA: Diagnosis present

## 2020-03-28 DIAGNOSIS — G2581 Restless legs syndrome: Secondary | ICD-10-CM | POA: Diagnosis present

## 2020-03-28 DIAGNOSIS — K5909 Other constipation: Secondary | ICD-10-CM | POA: Diagnosis present

## 2020-03-28 DIAGNOSIS — Z01810 Encounter for preprocedural cardiovascular examination: Secondary | ICD-10-CM

## 2020-03-28 DIAGNOSIS — D72829 Elevated white blood cell count, unspecified: Secondary | ICD-10-CM | POA: Diagnosis present

## 2020-03-28 DIAGNOSIS — J449 Chronic obstructive pulmonary disease, unspecified: Secondary | ICD-10-CM | POA: Diagnosis present

## 2020-03-28 DIAGNOSIS — E039 Hypothyroidism, unspecified: Secondary | ICD-10-CM

## 2020-03-28 DIAGNOSIS — E1142 Type 2 diabetes mellitus with diabetic polyneuropathy: Secondary | ICD-10-CM | POA: Diagnosis present

## 2020-03-28 DIAGNOSIS — I5033 Acute on chronic diastolic (congestive) heart failure: Secondary | ICD-10-CM

## 2020-03-28 DIAGNOSIS — I25110 Atherosclerotic heart disease of native coronary artery with unstable angina pectoris: Secondary | ICD-10-CM | POA: Diagnosis present

## 2020-03-28 DIAGNOSIS — I10 Essential (primary) hypertension: Secondary | ICD-10-CM

## 2020-03-28 DIAGNOSIS — R7989 Other specified abnormal findings of blood chemistry: Secondary | ICD-10-CM

## 2020-03-28 DIAGNOSIS — R778 Other specified abnormalities of plasma proteins: Secondary | ICD-10-CM

## 2020-03-28 DIAGNOSIS — E876 Hypokalemia: Secondary | ICD-10-CM | POA: Diagnosis present

## 2020-03-28 DIAGNOSIS — I2489 Other forms of acute ischemic heart disease: Secondary | ICD-10-CM

## 2020-03-28 DIAGNOSIS — Z79890 Hormone replacement therapy (postmenopausal): Secondary | ICD-10-CM

## 2020-03-28 DIAGNOSIS — J441 Chronic obstructive pulmonary disease with (acute) exacerbation: Secondary | ICD-10-CM | POA: Diagnosis present

## 2020-03-28 DIAGNOSIS — I350 Nonrheumatic aortic (valve) stenosis: Secondary | ICD-10-CM

## 2020-03-28 DIAGNOSIS — N179 Acute kidney failure, unspecified: Secondary | ICD-10-CM | POA: Diagnosis present

## 2020-03-28 DIAGNOSIS — T380X5A Adverse effect of glucocorticoids and synthetic analogues, initial encounter: Secondary | ICD-10-CM | POA: Diagnosis present

## 2020-03-28 DIAGNOSIS — I272 Pulmonary hypertension, unspecified: Secondary | ICD-10-CM | POA: Diagnosis present

## 2020-03-28 DIAGNOSIS — Z20822 Contact with and (suspected) exposure to covid-19: Secondary | ICD-10-CM | POA: Diagnosis present

## 2020-03-28 DIAGNOSIS — E785 Hyperlipidemia, unspecified: Secondary | ICD-10-CM | POA: Diagnosis present

## 2020-03-28 DIAGNOSIS — D631 Anemia in chronic kidney disease: Secondary | ICD-10-CM | POA: Diagnosis present

## 2020-03-28 DIAGNOSIS — Z9981 Dependence on supplemental oxygen: Secondary | ICD-10-CM

## 2020-03-28 DIAGNOSIS — G4733 Obstructive sleep apnea (adult) (pediatric): Secondary | ICD-10-CM

## 2020-03-28 DIAGNOSIS — E119 Type 2 diabetes mellitus without complications: Secondary | ICD-10-CM

## 2020-03-28 DIAGNOSIS — Z7984 Long term (current) use of oral hypoglycemic drugs: Secondary | ICD-10-CM

## 2020-03-28 DIAGNOSIS — J9611 Chronic respiratory failure with hypoxia: Secondary | ICD-10-CM

## 2020-03-28 LAB — CBC
HCT: 42.4 % (ref 36.0–46.0)
Hemoglobin: 13.5 g/dL (ref 12.0–15.0)
MCH: 28.8 pg (ref 26.0–34.0)
MCHC: 31.8 g/dL (ref 30.0–36.0)
MCV: 90.4 fL (ref 80.0–100.0)
Platelets: 223 10*3/uL (ref 150–400)
RBC: 4.69 MIL/uL (ref 3.87–5.11)
RDW: 15.4 % (ref 11.5–15.5)
WBC: 11 10*3/uL — ABNORMAL HIGH (ref 4.0–10.5)
nRBC: 0 % (ref 0.0–0.2)

## 2020-03-28 LAB — RESPIRATORY PANEL BY RT PCR (FLU A&B, COVID)
Influenza A by PCR: NEGATIVE
Influenza B by PCR: NEGATIVE
SARS Coronavirus 2 by RT PCR: NEGATIVE

## 2020-03-28 LAB — TROPONIN I (HIGH SENSITIVITY): Troponin I (High Sensitivity): 25 ng/L — ABNORMAL HIGH (ref <18)

## 2020-03-28 NOTE — ED Provider Notes (Signed)
-----------------------------------------   11:00 PM on 03/28/2020 -----------------------------------------  Blood pressure 131/72, pulse (!) 103, temperature 97.8 F (36.6 C), resp. rate 20, height 5\' 2"  (1.575 m), weight 104.3 kg, SpO2 92 %.  Assuming care from Dr. Kerman Passey.  In short, Kristin [name] is a 69 y.o. female with a chief complaint of Chest Pain .  Refer to the original H&P for additional details.  The current plan of care is to follow-up chest pain workup.  ----------------------------------------- 2:00 AM on 03/29/2020 -----------------------------------------  Patient's troponin noted to have significant change from 25 to 89 on repeat.  On reassessment, she endorses ongoing heaviness in the center of her chest.  We will treat with aspirin and nitroglycerin, plan to trend troponin.  Patient's daughter updated via phone and case discussed with hospitalist for admission.    Blake Divine, MD 03/29/20 0201

## 2020-03-28 NOTE — ED Triage Notes (Signed)
Reported via EMS for sudden chest pain that lasted about 30 minutes. Radiated into left arm and stomach. Reports lots of belching after dinner. Given 2mg  of metoprolol in route by EMS.

## 2020-03-28 NOTE — ED Provider Notes (Signed)
[facility] Emergency Department Provider Note  Time seen: 10:08 PM  I have reviewed the triage vital signs and the nursing notes.   HISTORY  Chief Complaint Chest Pain   HPI Kristin [name] is a 69 y.o. female with a past medical history of anxiety, CHF, COPD, diabetes, gastric reflux, 3 L O2 24/7, presents to the emergency department for chest pain.  According to the patient approximate 1 hour prior to arrival she developed moderate central chest pain/pressure along with some shortness of breath and nausea.  Denies diaphoresis.  Denies any recent cough or fever.  Denies any prior heart attacks or stents.  Does not believe she is on any anticoagulation but states she was recently diagnosed with atrial fibrillation.   Past Medical History:  Diagnosis Date  . Anxiety   . Asthma   . CHF (congestive heart failure) ([place])    2018  . COPD (chronic obstructive pulmonary disease) ([place])   . Diabetes mellitus without complication ([place])   . Dyspnea   . GERD (gastroesophageal reflux disease)   . History of hiatal hernia   . History of orthopnea   . Hypothyroidism   . Neuropathy   . Oxygen deficiency    3L/HS  . Pain    BACK/DDD  . Peripheral vascular disease ([place])   . RLS (restless legs syndrome)   . Sleep apnea   . Wheezing     Patient Active Problem List   Diagnosis Date Noted  . Chronic diastolic (congestive) heart failure ([place]) 07/12/2018  . Diabetes ([place]) 07/12/2018  . COPD (chronic obstructive pulmonary disease) ([place]) 07/12/2018  . Tobacco use 07/12/2018  . Hypotension 07/12/2018  . Respiratory failure ([place]) 07/04/2018    Past Surgical History:  Procedure Laterality Date  . BREAST SURGERY    . CATARACT EXTRACTION W/PHACO Right 03/02/2018   Procedure: CATARACT EXTRACTION PHACO AND INTRAOCULAR LENS PLACEMENT ([place]);  Surgeon: Marchia Meiers, MD;  Location: [facility] ORS;  Service: Ophthalmology;  Laterality: Right;  [place] 01:15 CDE 16.46 Fluid pack lot #  [id number]  . CATARACT EXTRACTION W/PHACO Left 04/27/2018   Procedure: CATARACT EXTRACTION PHACO AND INTRAOCULAR LENS PLACEMENT ([place])- LEFT DIABETIC;  Surgeon: Marchia Meiers, MD;  Location: [facility] ORS;  Service: Ophthalmology;  Laterality: Left;  Lot # [id number] H [place]: 00:46.3 CDE: 8.07   . CYST EXCISION     FOREHEAD  . FOOT SURGERY     CYST  . TUBAL LIGATION      Prior to Admission medications   Medication Sig Start Date End Date Taking? Authorizing Provider  ACCU-CHEK GUIDE test strip  09/25/19   [provider]  albuterol (PROAIR HFA) 108 (90 Base) MCG/ACT inhaler Inhale 2 puffs into the lungs every 6 (six) hours as needed for wheezing or shortness of breath.    [provider]  aspirin EC 81 MG tablet Take 81 mg by mouth daily.    [provider]  atorvastatin (LIPITOR) 80 MG tablet  09/05/19   [provider]  Calcium Carb-Cholecalciferol (CALCIUM 600+D3 PO) Take 1 tablet by mouth daily.    [provider]  carvedilol (COREG) 3.125 MG tablet Take 1 tablet (3.125 mg total) by mouth 2 (two) times daily with a meal. 07/07/18   Mayo, Pete Pelt, MD  citalopram (CELEXA) 40 MG tablet Take 40 mg by mouth daily.    [provider]  cyanocobalamin (,VITAMIN B-12,) 1000 MCG/ML injection Inject 1,000 mcg into the muscle every 30 (thirty) days.    [provider]  doxycycline (VIBRAMYCIN) 100 MG capsule Take 100 mg by mouth daily. 08/31/19   [provider]  esomeprazole (NEXIUM) 40 MG capsule Take 40 mg by mouth daily. 10/01/19   [provider]  famotidine (PEPCID) 40 MG tablet Take 40 mg by mouth daily. 08/04/19   [provider]  fenofibrate 160 MG tablet Take 160 mg by mouth daily.    [provider]  furosemide (LASIX) 20 MG tablet Take 1 tablet (20 mg total) by mouth every other day. 07/12/18   Alisa Graff, FNP  gabapentin (NEURONTIN) 600 MG tablet Take by mouth. 11/20/19   [provider]   levothyroxine (SYNTHROID, LEVOTHROID) 75 MCG tablet Take 75 mcg by mouth daily before breakfast.    [provider]  lidocaine (LIDODERM) 5 % SMARTSIG:Patch(s) Topical 11/20/19   [provider]  lisinopril (PRINIVIL,ZESTRIL) 20 MG tablet Take 20 mg by mouth daily.    [provider]  metFORMIN (GLUCOPHAGE) 500 MG tablet Take 500 mg by mouth 2 (two) times daily. 09/19/19   [provider]  MOVANTIK 25 MG TABS tablet  09/24/19   [provider]  Multiple Vitamins-Minerals (PRESERVISION AREDS PO) Take 1 tablet by mouth daily.     [provider]  omeprazole (PRILOSEC) 40 MG capsule Take 1 capsule (40 mg total) by mouth daily before breakfast. 10/09/19   Vanga, Tally Due, MD  oxyCODONE-acetaminophen (PERCOCET) 10-325 MG tablet Take 1 tablet by mouth 4 (four) times daily.     [provider]  OXYGEN Inhale 3 L into the lungs.    [provider]  ranitidine (ZANTAC) 150 MG tablet Take 150 mg by mouth 2 (two) times daily.    [provider]  sitaGLIPtin (JANUVIA) 100 MG tablet Take 100 mg by mouth daily.    [provider]  Tiotropium Bromide Monohydrate (SPIRIVA RESPIMAT) 1.25 MCG/ACT AERS Inhale 1 puff into the lungs 2 (two) times daily.     [provider]    Allergies  Allergen Reactions  . Altace [Ramipril] Swelling    No family history on file.  Social History Social History   Tobacco Use  . Smoking status: Current Every Day Smoker  . Smokeless tobacco: Never Used  Substance Use Topics  . Alcohol use: Yes  . Drug use: Not on file    Review of Systems Constitutional: Negative for fever. Cardiovascular: Positive for chest pain Respiratory: Negative for shortness of breath. Gastrointestinal: Negative for abdominal pain, vomiting Musculoskeletal: Negative for musculoskeletal complaints Neurological: Negative for headache All other ROS  negative  ____________________________________________   PHYSICAL EXAM:  VITAL SIGNS: ED Triage Vitals  Enc Vitals Group     BP --      Pulse Rate 03/28/20 2148 (!) 109     Resp 03/28/20 2148 20     Temp 03/28/20 2148 97.8 F (36.6 C)     Temp src --      SpO2 03/28/20 2148 90 %     Weight 03/28/20 2157 230 lb (104.3 kg)     Height 03/28/20 2157 5\' 2"  (1.575 m)     Head Circumference --      Peak Flow --      Pain Score 03/28/20 2148 2     Pain Loc --      Pain Edu? --      Excl. in [place]? --    Constitutional: Alert and oriented. Well appearing and in no distress. Eyes: Normal exam ENT  Head: Normocephalic and atraumatic.      Mouth/Throat: Mucous membranes are moist. Cardiovascular: Normal rate, regular rhythm.  Respiratory: Normal respiratory effort without tachypnea nor retractions. Breath sounds are clear Gastrointestinal: Soft and nontender. No distention. Musculoskeletal: Nontender with normal range of motion in all extremities.  Neurologic:  Normal speech and language. No gross focal neurologic deficits Skin:  Skin is warm, dry and intact.  Psychiatric: Mood and affect are normal.   ____________________________________________    EKG  EKG viewed and interpreted by myself shows sinus tachycardia 108 bpm with a narrow QRS, normal axis, normal intervals, nonspecific ST changes.  ____________________________________________    RADIOLOGY  Chest x-ray negative  ____________________________________________   INITIAL IMPRESSION / ASSESSMENT AND PLAN / ED COURSE  Pertinent labs & imaging results that were available during my care of the patient were reviewed by me and considered in my medical decision making (see chart for details).   Patient presents to the emergency department for chest pain [place] 1 hour prior to arrival.  States the pain is nearly resolved at this time but does state a mild pressure sensation to the center of her chest.  We will check  labs, x-ray and closely monitor.  Overall patient appears well, no distress.  Satting in the mid 90s on 3 L which is her baseline O2 requirement.  X-ray is negative.  EKG shows no concerning findings.  Labs are pending.  Patient care signed out to Dr. Randell Loop A Hagarty was evaluated in Emergency Department on 03/28/2020 for the symptoms described in the history of present illness. She was evaluated in the context of the global COVID-19 pandemic, which necessitated consideration that the patient might be at risk for infection with the SARS-CoV-2 virus that causes COVID-19. Institutional protocols and algorithms that pertain to the evaluation of patients at risk for COVID-19 are in a state of rapid change based on information released by regulatory bodies including the CDC and federal and state organizations. These policies and algorithms were followed during the patient's care in the ED.  ____________________________________________   FINAL CLINICAL IMPRESSION(S) / ED DIAGNOSES  Chest pain   Harvest Dark, MD 03/28/20 2324

## 2020-03-28 NOTE — ED Notes (Signed)
Green top recollect sent to lab

## 2020-03-28 NOTE — ED Notes (Signed)
Updated Kristin [name]

## 2020-03-28 NOTE — ED Notes (Signed)
Pt gave permission to update both daughters, judy and tammy

## 2020-03-29 ENCOUNTER — Encounter: Payer: Self-pay | Admitting: Internal Medicine

## 2020-03-29 ENCOUNTER — Observation Stay (HOSPITAL_COMMUNITY)
Admit: 2020-03-29 | Discharge: 2020-03-29 | Disposition: A | Discharge status: Home / Self Care | Payer: Medicare HMO | Attending: Physician Assistant | Admitting: Physician Assistant

## 2020-03-29 DIAGNOSIS — I10 Essential (primary) hypertension: Secondary | ICD-10-CM

## 2020-03-29 DIAGNOSIS — J9611 Chronic respiratory failure with hypoxia: Secondary | ICD-10-CM | POA: Diagnosis present

## 2020-03-29 DIAGNOSIS — E876 Hypokalemia: Secondary | ICD-10-CM | POA: Diagnosis present

## 2020-03-29 DIAGNOSIS — Z7982 Long term (current) use of aspirin: Secondary | ICD-10-CM | POA: Diagnosis not present

## 2020-03-29 DIAGNOSIS — Z9981 Dependence on supplemental oxygen: Secondary | ICD-10-CM | POA: Diagnosis not present

## 2020-03-29 DIAGNOSIS — I214 Non-ST elevation (NSTEMI) myocardial infarction: Principal | ICD-10-CM

## 2020-03-29 DIAGNOSIS — Z7984 Long term (current) use of oral hypoglycemic drugs: Secondary | ICD-10-CM | POA: Diagnosis not present

## 2020-03-29 DIAGNOSIS — D72829 Elevated white blood cell count, unspecified: Secondary | ICD-10-CM | POA: Diagnosis present

## 2020-03-29 DIAGNOSIS — I5032 Chronic diastolic (congestive) heart failure: Secondary | ICD-10-CM

## 2020-03-29 DIAGNOSIS — E039 Hypothyroidism, unspecified: Secondary | ICD-10-CM

## 2020-03-29 DIAGNOSIS — R079 Chest pain, unspecified: Secondary | ICD-10-CM

## 2020-03-29 DIAGNOSIS — R778 Other specified abnormalities of plasma proteins: Secondary | ICD-10-CM

## 2020-03-29 DIAGNOSIS — F17200 Nicotine dependence, unspecified, uncomplicated: Secondary | ICD-10-CM

## 2020-03-29 DIAGNOSIS — E66813 Obesity, class 3: Secondary | ICD-10-CM

## 2020-03-29 DIAGNOSIS — I5033 Acute on chronic diastolic (congestive) heart failure: Secondary | ICD-10-CM | POA: Diagnosis present

## 2020-03-29 DIAGNOSIS — J432 Centrilobular emphysema: Secondary | ICD-10-CM

## 2020-03-29 DIAGNOSIS — J441 Chronic obstructive pulmonary disease with (acute) exacerbation: Secondary | ICD-10-CM | POA: Diagnosis present

## 2020-03-29 DIAGNOSIS — Z79890 Hormone replacement therapy (postmenopausal): Secondary | ICD-10-CM | POA: Diagnosis not present

## 2020-03-29 DIAGNOSIS — J449 Chronic obstructive pulmonary disease, unspecified: Secondary | ICD-10-CM

## 2020-03-29 DIAGNOSIS — E1151 Type 2 diabetes mellitus with diabetic peripheral angiopathy without gangrene: Secondary | ICD-10-CM | POA: Diagnosis present

## 2020-03-29 DIAGNOSIS — I2510 Atherosclerotic heart disease of native coronary artery without angina pectoris: Secondary | ICD-10-CM | POA: Diagnosis not present

## 2020-03-29 DIAGNOSIS — N179 Acute kidney failure, unspecified: Secondary | ICD-10-CM | POA: Diagnosis present

## 2020-03-29 DIAGNOSIS — Z20822 Contact with and (suspected) exposure to covid-19: Secondary | ICD-10-CM | POA: Diagnosis present

## 2020-03-29 DIAGNOSIS — Z6841 Body Mass Index (BMI) 40.0 and over, adult: Secondary | ICD-10-CM | POA: Diagnosis not present

## 2020-03-29 DIAGNOSIS — R072 Precordial pain: Secondary | ICD-10-CM | POA: Diagnosis not present

## 2020-03-29 DIAGNOSIS — D631 Anemia in chronic kidney disease: Secondary | ICD-10-CM | POA: Diagnosis present

## 2020-03-29 DIAGNOSIS — Z79899 Other long term (current) drug therapy: Secondary | ICD-10-CM | POA: Diagnosis not present

## 2020-03-29 DIAGNOSIS — G4733 Obstructive sleep apnea (adult) (pediatric): Secondary | ICD-10-CM

## 2020-03-29 DIAGNOSIS — I110 Hypertensive heart disease with heart failure: Secondary | ICD-10-CM | POA: Diagnosis present

## 2020-03-29 DIAGNOSIS — I248 Other forms of acute ischemic heart disease: Secondary | ICD-10-CM | POA: Diagnosis not present

## 2020-03-29 DIAGNOSIS — E1142 Type 2 diabetes mellitus with diabetic polyneuropathy: Secondary | ICD-10-CM | POA: Diagnosis present

## 2020-03-29 DIAGNOSIS — I209 Angina pectoris, unspecified: Secondary | ICD-10-CM | POA: Diagnosis not present

## 2020-03-29 DIAGNOSIS — I361 Nonrheumatic tricuspid (valve) insufficiency: Secondary | ICD-10-CM | POA: Diagnosis not present

## 2020-03-29 DIAGNOSIS — E119 Type 2 diabetes mellitus without complications: Secondary | ICD-10-CM

## 2020-03-29 DIAGNOSIS — F17210 Nicotine dependence, cigarettes, uncomplicated: Secondary | ICD-10-CM | POA: Diagnosis present

## 2020-03-29 DIAGNOSIS — E785 Hyperlipidemia, unspecified: Secondary | ICD-10-CM | POA: Diagnosis present

## 2020-03-29 DIAGNOSIS — K219 Gastro-esophageal reflux disease without esophagitis: Secondary | ICD-10-CM | POA: Diagnosis present

## 2020-03-29 DIAGNOSIS — I350 Nonrheumatic aortic (valve) stenosis: Secondary | ICD-10-CM | POA: Diagnosis not present

## 2020-03-29 DIAGNOSIS — I25110 Atherosclerotic heart disease of native coronary artery with unstable angina pectoris: Secondary | ICD-10-CM | POA: Diagnosis present

## 2020-03-29 LAB — CBC
HCT: 41.5 % (ref 36.0–46.0)
Hemoglobin: 13.1 g/dL (ref 12.0–15.0)
MCH: 28.5 pg (ref 26.0–34.0)
MCHC: 31.6 g/dL (ref 30.0–36.0)
MCV: 90.4 fL (ref 80.0–100.0)
Platelets: 216 10*3/uL (ref 150–400)
RBC: 4.59 MIL/uL (ref 3.87–5.11)
RDW: 15.5 % (ref 11.5–15.5)
WBC: 8.3 10*3/uL (ref 4.0–10.5)
nRBC: 0 % (ref 0.0–0.2)

## 2020-03-29 LAB — LIPID PANEL
Cholesterol: 193 mg/dL (ref 0–200)
HDL: 52 mg/dL (ref >40)
LDL Cholesterol: 113 mg/dL — ABNORMAL HIGH (ref 0–99)
Total CHOL/HDL Ratio: 3.7 RATIO
Triglycerides: 140 mg/dL (ref <150)
VLDL: 28 mg/dL (ref 0–40)

## 2020-03-29 LAB — HEMOGLOBIN A1C
Hgb A1c MFr Bld: 6 % — ABNORMAL HIGH (ref 4.8–5.6)
Mean Plasma Glucose: 125.5 mg/dL

## 2020-03-29 LAB — COMPREHENSIVE METABOLIC PANEL
ALT: 14 U/L (ref 0–44)
AST: 19 U/L (ref 15–41)
Albumin: 3.3 g/dL — ABNORMAL LOW (ref 3.5–5.0)
Alkaline Phosphatase: 37 U/L — ABNORMAL LOW (ref 38–126)
Anion gap: 11 (ref 5–15)
BUN: 16 mg/dL (ref 8–23)
CO2: 31 mmol/L (ref 22–32)
Calcium: 8.9 mg/dL (ref 8.9–10.3)
Chloride: 99 mmol/L (ref 98–111)
Creatinine, Ser: 1.08 mg/dL — ABNORMAL HIGH (ref 0.44–1.00)
GFR, Estimated: 56 mL/min — ABNORMAL LOW (ref >60)
Glucose, Bld: 152 mg/dL — ABNORMAL HIGH (ref 70–99)
Potassium: 3.6 mmol/L (ref 3.5–5.1)
Sodium: 141 mmol/L (ref 135–145)
Total Bilirubin: 0.5 mg/dL (ref 0.3–1.2)
Total Protein: 6.9 g/dL (ref 6.5–8.1)

## 2020-03-29 LAB — BASIC METABOLIC PANEL
Anion gap: 8 (ref 5–15)
BUN: 16 mg/dL (ref 8–23)
CO2: 34 mmol/L — ABNORMAL HIGH (ref 22–32)
Calcium: 9 mg/dL (ref 8.9–10.3)
Chloride: 97 mmol/L — ABNORMAL LOW (ref 98–111)
Creatinine, Ser: 0.93 mg/dL (ref 0.44–1.00)
GFR, Estimated: >60 mL/min (ref >60)
Glucose, Bld: 116 mg/dL — ABNORMAL HIGH (ref 70–99)
Potassium: 3.9 mmol/L (ref 3.5–5.1)
Sodium: 139 mmol/L (ref 135–145)

## 2020-03-29 LAB — ECHOCARDIOGRAM COMPLETE
AR max vel: 0.98 cm2
AV Area VTI: 1.11 cm2
AV Area mean vel: 0.99 cm2
AV Mean grad: 31 mmHg
AV Peak grad: 53.6 mmHg
Ao pk vel: 3.66 m/s
Area-P 1/2: 4.54 cm2
Height: 62 in
S' Lateral: 3.4 cm
Weight: 3715.2 oz

## 2020-03-29 LAB — GLUCOSE, CAPILLARY
Glucose-Capillary: 128 mg/dL — ABNORMAL HIGH (ref 70–99)
Glucose-Capillary: 144 mg/dL — ABNORMAL HIGH (ref 70–99)
Glucose-Capillary: 217 mg/dL — ABNORMAL HIGH (ref 70–99)
Glucose-Capillary: 118 mg/dL — ABNORMAL HIGH (ref 70–99)

## 2020-03-29 LAB — LIPASE, BLOOD: Lipase: 40 U/L (ref 11–51)

## 2020-03-29 LAB — TROPONIN I (HIGH SENSITIVITY)
Troponin I (High Sensitivity): 89 ng/L — ABNORMAL HIGH (ref <18)
Troponin I (High Sensitivity): 240 ng/L (ref <18)
Troponin I (High Sensitivity): 248 ng/L (ref <18)
Troponin I (High Sensitivity): 179 ng/L (ref <18)

## 2020-03-29 LAB — HEPARIN LEVEL (UNFRACTIONATED)
Heparin Unfractionated: <0.1 IU/mL — ABNORMAL LOW (ref 0.30–0.70)
Heparin Unfractionated: <0.1 IU/mL — ABNORMAL LOW (ref 0.30–0.70)
Heparin Unfractionated: 0.19 IU/mL — ABNORMAL LOW (ref 0.30–0.70)

## 2020-03-29 LAB — TSH: TSH: 0.623 u[IU]/mL (ref 0.350–4.500)

## 2020-03-29 LAB — BRAIN NATRIURETIC PEPTIDE: B Natriuretic Peptide: 251.3 pg/mL — ABNORMAL HIGH (ref 0.0–100.0)

## 2020-03-29 LAB — MAGNESIUM: Magnesium: 1.7 mg/dL (ref 1.7–2.4)

## 2020-03-29 MED ORDER — NITROGLYCERIN 0.4 MG SL SUBL: 0.4000 mg | SUBLINGUAL_TABLET | SUBLINGUAL | Status: DC | PRN

## 2020-03-29 MED ORDER — POTASSIUM CHLORIDE CRYS ER 20 MEQ PO TBCR
40.0000 meq | EXTENDED_RELEASE_TABLET | Freq: Once | ORAL | Status: AC
  Administered 2020-03-29: 40 meq via ORAL
  Filled 2020-03-29: qty 2

## 2020-03-29 MED ORDER — ASPIRIN EC 81 MG PO TBEC
81.0000 mg | DELAYED_RELEASE_TABLET | Freq: Every day | ORAL | Status: DC
  Administered 2020-03-29 – 2020-04-02 (×4): 81 mg via ORAL
  Filled 2020-03-29 (×4): qty 1

## 2020-03-29 MED ORDER — IPRATROPIUM-ALBUTEROL 0.5-2.5 (3) MG/3ML IN SOLN: 3.0000 mL | RESPIRATORY_TRACT | Status: DC | PRN

## 2020-03-29 MED ORDER — PERFLUTREN LIPID MICROSPHERE
1.0000 mL | INTRAVENOUS | Status: AC | PRN
  Administered 2020-03-29: 4.5 mL via INTRAVENOUS
  Filled 2020-03-29: qty 10

## 2020-03-29 MED ORDER — HEPARIN BOLUS VIA INFUSION
2200.0000 [IU] | Freq: Once | INTRAVENOUS | Status: AC
  Administered 2020-03-29: 2200 [IU] via INTRAVENOUS
  Filled 2020-03-29: qty 2200

## 2020-03-29 MED ORDER — ALBUTEROL SULFATE HFA 108 (90 BASE) MCG/ACT IN AERS
2.0000 | INHALATION_SPRAY | Freq: Four times a day (QID) | RESPIRATORY_TRACT | Status: DC | PRN
  Filled 2020-03-29: qty 6.7

## 2020-03-29 MED ORDER — INSULIN ASPART 100 UNIT/ML ~~LOC~~ SOLN: 0.0000 [IU] | Freq: Every day | SUBCUTANEOUS | Status: DC

## 2020-03-29 MED ORDER — NITROGLYCERIN 0.4 MG SL SUBL
0.4000 mg | SUBLINGUAL_TABLET | SUBLINGUAL | Status: DC | PRN
  Administered 2020-03-29: 0.4 mg via SUBLINGUAL
  Filled 2020-03-29: qty 1

## 2020-03-29 MED ORDER — LOSARTAN POTASSIUM 25 MG PO TABS
25.0000 mg | ORAL_TABLET | Freq: Every day | ORAL | Status: DC
  Administered 2020-03-30 – 2020-04-02 (×3): 25 mg via ORAL
  Filled 2020-03-29 (×3): qty 1

## 2020-03-29 MED ORDER — CARVEDILOL 6.25 MG PO TABS
6.2500 mg | ORAL_TABLET | Freq: Two times a day (BID) | ORAL | Status: DC
  Administered 2020-03-29 – 2020-03-30 (×4): 6.25 mg via ORAL
  Filled 2020-03-29 (×4): qty 1

## 2020-03-29 MED ORDER — LEVOTHYROXINE SODIUM 50 MCG PO TABS
75.0000 ug | ORAL_TABLET | Freq: Every day | ORAL | Status: DC
  Administered 2020-03-29 – 2020-04-02 (×5): 75 ug via ORAL
  Filled 2020-03-29 (×3): qty 1
  Filled 2020-03-29: qty 2
  Filled 2020-03-29: qty 1

## 2020-03-29 MED ORDER — CARVEDILOL 6.25 MG PO TABS: 3.1250 mg | ORAL_TABLET | Freq: Two times a day (BID) | ORAL | Status: DC

## 2020-03-29 MED ORDER — HEPARIN (PORCINE) 25000 UT/250ML-% IV SOLN
1850.0000 [IU]/h | INTRAVENOUS | Status: DC
  Administered 2020-03-29: 1300 [IU]/h via INTRAVENOUS
  Administered 2020-03-29: 1050 [IU]/h via INTRAVENOUS
  Administered 2020-03-30: 1650 [IU]/h via INTRAVENOUS
  Administered 2020-03-31: 1850 [IU]/h via INTRAVENOUS
  Filled 2020-03-29 (×4): qty 250

## 2020-03-29 MED ORDER — PREDNISONE 20 MG PO TABS
40.0000 mg | ORAL_TABLET | Freq: Every day | ORAL | Status: DC
  Administered 2020-03-29 – 2020-04-02 (×4): 40 mg via ORAL
  Filled 2020-03-29 (×4): qty 2

## 2020-03-29 MED ORDER — ONDANSETRON HCL 4 MG/2ML IJ SOLN: 4.0000 mg | Freq: Four times a day (QID) | INTRAMUSCULAR | Status: DC | PRN

## 2020-03-29 MED ORDER — ASPIRIN 81 MG PO CHEW
324.0000 mg | CHEWABLE_TABLET | Freq: Once | ORAL | Status: AC
  Administered 2020-03-29: 324 mg via ORAL
  Filled 2020-03-29: qty 4

## 2020-03-29 MED ORDER — ACETAMINOPHEN 325 MG PO TABS
650.0000 mg | ORAL_TABLET | ORAL | Status: DC | PRN
  Administered 2020-04-01: 650 mg via ORAL
  Filled 2020-03-29: qty 2

## 2020-03-29 MED ORDER — HEPARIN BOLUS VIA INFUSION
2250.0000 [IU] | Freq: Once | INTRAVENOUS | Status: AC
  Administered 2020-03-29: 2250 [IU] via INTRAVENOUS
  Filled 2020-03-29: qty 2250

## 2020-03-29 MED ORDER — INSULIN ASPART 100 UNIT/ML ~~LOC~~ SOLN
0.0000 [IU] | Freq: Three times a day (TID) | SUBCUTANEOUS | Status: DC
  Administered 2020-03-29 (×2): 3 [IU] via SUBCUTANEOUS
  Administered 2020-03-30: 4 [IU] via SUBCUTANEOUS
  Administered 2020-03-31 – 2020-04-01 (×2): 7 [IU] via SUBCUTANEOUS
  Administered 2020-04-01: 11 [IU] via SUBCUTANEOUS
  Administered 2020-04-02: 3 [IU] via SUBCUTANEOUS
  Filled 2020-03-29 (×7): qty 1

## 2020-03-29 MED ORDER — HEPARIN BOLUS VIA INFUSION
4000.0000 [IU] | Freq: Once | INTRAVENOUS | Status: AC
  Administered 2020-03-29: 4000 [IU] via INTRAVENOUS
  Filled 2020-03-29: qty 4000

## 2020-03-29 MED ORDER — ATORVASTATIN CALCIUM 80 MG PO TABS
80.0000 mg | ORAL_TABLET | Freq: Every day | ORAL | Status: DC
  Administered 2020-03-29 – 2020-04-01 (×4): 80 mg via ORAL
  Filled 2020-03-29 (×4): qty 1

## 2020-03-29 MED ORDER — DILTIAZEM HCL 30 MG PO TABS: 60.0000 mg | ORAL_TABLET | Freq: Four times a day (QID) | ORAL | Status: DC

## 2020-03-29 NOTE — Progress Notes (Signed)
PROGRESS NOTE    S[place]onia A Votaw  QMG:[id number] DOB: [birth year] DOA: 03/28/2020 PCP: Lorelee Market, MD   Brief Narrative:  Patient is a 69 year old female with past medical history of diabetes, hypertension, chronic respiratory failure on 3 L oxygen via nasal cannula at home secondary to underlying COPD, OSA, hypothyroidism, GERD, diastolic CHF presents to emergency department with retrosternal chest pain associated with shortness of breath and nausea.  ED course: Patient was pain-free on arrival.  She was tachycardic with O2 sats in the 90s on 3 L of oxygen, first troponin XX 5 trended trended up to 89.  Lipase: 40.  EKG: No ischemic changes.  Chest x-ray negative.  Cardiology consulted.  [facility]ist consulted for admission for chest pain rule out ACS.  Assessment & Plan:  NSTEMI: -Risk factors including hypertension, hyperlipidemia, diabetes mellitus, ongoing tobacco abuse, obesity -Troponin trending up from 25-->29-->240.  Reviewed EKG: No acute ischemic changes noted. -Patient started on heparin gtt.-continue same -Continue aspirin, beta-blocker and statin, nitro as needed for chest pain. -Cardiology consulted-appreciate input -Echo is pending -Monitor vitals closely.  Chronic hypoxemic respiratory failure secondary to underlying COPD and ongoing tobacco abuse: At baseline. -Patient is requiring 3 L which is her baseline. -DuoNebs as needed.  Continue home inhalers.  Chest x-ray negative.  COVID-19 negative.  Continue prednisone 40 mg daily for 5 days.     Chronic diastolic CHF: Patient appears euvolemic on exam. -Repeat echo is pending.  BNP: 251 -Continue aspirin, statin, Coreg.  Hold Lasix due to AKI. -Strict INO's and daily weight.  Monitor electrolytes closely  Hypertension: Stable -Continue Coreg.  Added losartan by cardiology.  Continue to monitor blood pressure closely.  Type 2 diabetes mellitus: Well controlled.  A1c 6.0% -Continue sliding scale  insulin.  Hypothyroidism: TSH: WNL.  Continue levothyroxine  Tobacco abuse: Counseled about cessation  Morbid obesity with BMI of 42 -Diet modification/exercise and weight loss recommended  Leukocytosis: Likely secondary to prednisone -No signs of infection.  Chest x-ray negative.  COVID-19 negative.  No indication of antibiotics at this time.  AKI: Resolved  OSA: Nighttime CPAP  DVT prophylaxis: Heparin GTT Code Status: Full code Family Communication:  None present at bedside.  Plan of care discussed with patient in length and she verbalized understanding and agreed with it. Disposition Plan: Home  Consultants:   Cardiology  Procedures:   None  Antimicrobials:   None  Status is: Observation  Dispo: The patient is from: Home              Anticipated d/c is to: Home              Anticipated d/c date is: 03/30/2020              Patient currently not medically stable for the discharge   Subjective: Patient seen and examined.  Tells me that her chest pain has resolved however she continues to have shortness of breath and she feels that her stomach is swollen, she denies palpitation, leg swelling, orthopnea or PND.  She continues to smoke half pack of cigarettes per day even though she is on oxygen at home.  Objective: Vitals:   03/29/20 0630 03/29/20 0730 03/29/20 0929 03/29/20 1200  BP: (!) 175/69 (!) 171/58 (!) 149/59 139/64  Pulse: 77 72 73 71  Resp: 14 19 20 18   Temp:   97.9 F (36.6 C) 98.7 F (37.1 C)  TempSrc:   Oral Oral  SpO2: 99% 95% 95% 97%  Weight:  105.3 kg   Height:   5\' 2"  (1.575 m)     Intake/Output Summary (Last 24 hours) at 03/29/2020 1317 Last data filed at 03/29/2020 1200 Gross per 24 hour  Intake --  Output 500 ml  Net -500 ml   Filed Weights   03/28/20 2157 03/29/20 0929  Weight: 104.3 kg 105.3 kg    Examination:  General exam: Appears calm and comfortable, on 3 L of oxygen via nasal cannula, obese, communicating Respiratory  system: Clear to auscultation. Respiratory effort normal. Cardiovascular system: S1 & S2 heard, RRR. No JVD, murmurs, rubs, gallops or clicks. No pedal edema. Gastrointestinal system: Abdomen is nondistended, soft and nontender. No organomegaly or masses felt. Normal bowel sounds heard. Central nervous system: Alert and oriented. No focal neurological deficits. Extremities: Symmetric 5 x 5 power. Skin: Multiple skin lesions noted on upper back  psychiatry: Judgement and insight appear normal. Mood & affect appropriate.    Data Reviewed: I have personally reviewed following labs and imaging studies  CBC: Recent Labs  Lab 03/28/20 2206 03/29/20 1003  WBC 11.0* 8.3  HGB 13.5 13.1  HCT 42.4 41.5  MCV 90.4 90.4  PLT 223 505   Basic Metabolic Panel: Recent Labs  Lab 03/28/20 2341 03/29/20 1003  NA 141 139  K 3.6 3.9  CL 99 97*  CO2 31 34*  GLUCOSE 152* 116*  BUN 16 16  CREATININE 1.08* 0.93  CALCIUM 8.9 9.0  MG  --  1.7   GFR: Estimated Creatinine Clearance: 66.9 mL/min (by C-G formula based on SCr of 0.93 mg/dL). Liver Function Tests: Recent Labs  Lab 03/28/20 2341  AST 19  ALT 14  ALKPHOS 37*  BILITOT 0.5  PROT 6.9  ALBUMIN 3.3*   Recent Labs  Lab 03/28/20 2341  LIPASE 40   No results for input(s): AMMONIA in the last 168 hours. Coagulation Profile: No results for input(s): INR, PROTIME in the last 168 hours. Cardiac Enzymes: No results for input(s): CKTOTAL, CKMB, CKMBINDEX, TROPONINI in the last 168 hours. BNP (last 3 results) No results for input(s): PROBNP in the last 8760 hours. HbA1C: Recent Labs    03/28/20 2206  HGBA1C 6.0*   CBG: Recent Labs  Lab 03/29/20 1153  GLUCAP 128*   Lipid Profile: Recent Labs    03/29/20 1003  CHOL 193  HDL 52  LDLCALC 113*  TRIG 140  CHOLHDL 3.7   Thyroid Function Tests: Recent Labs    03/29/20 1003  TSH 0.623   Anemia Panel: No results for input(s): VITAMINB12, FOLATE, FERRITIN, TIBC, IRON,  RETICCTPCT in the last 72 hours. Sepsis Labs: No results for input(s): PROCALCITON, LATICACIDVEN in the last 168 hours.  Recent Results (from the past 240 hour(s))  Respiratory Panel by RT PCR (Flu A&B, Covid) - Nasopharyngeal Swab     Status: None   Collection Time: 03/28/20 10:06 PM   Specimen: Nasopharyngeal Swab  Result Value Ref Range Status   SARS Coronavirus 2 by RT PCR NEGATIVE NEGATIVE Final    Comment: (NOTE) SARS-CoV-2 target nucleic acids are NOT DETECTED.  The SARS-CoV-2 RNA is generally detectable in upper respiratoy specimens during the acute phase of infection. The lowest concentration of SARS-CoV-2 viral copies this assay can detect is 131 copies/mL. A negative result does not preclude SARS-Cov-2 infection and should not be used as the sole basis for treatment or other patient management decisions. A negative result may occur with  improper specimen collection/handling, submission of specimen other than nasopharyngeal swab, presence  of viral mutation(s) within the areas targeted by this assay, and inadequate number of viral copies (<131 copies/mL). A negative result must be combined with clinical observations, patient history, and epidemiological information. The expected result is Negative.  Fact Sheet for Patients:  PinkCheek.be  Fact Sheet for Healthcare Providers:  GravelBags.it  This test is no t yet approved or cleared by the [place] FDA and  has been authorized for detection and/or diagnosis of SARS-CoV-2 by FDA under an Emergency Use Authorization (EUA). This EUA will remain  in effect (meaning this test can be used) for the duration of the COVID-19 declaration under Section 564(b)(1) of the Act, 21 U.S.C. section 360bbb-3(b)(1), unless the authorization is terminated or revoked sooner.     Influenza A by PCR NEGATIVE NEGATIVE Final   Influenza B by PCR NEGATIVE NEGATIVE Final     Comment: (NOTE) The Xpert Xpress SARS-CoV-2/FLU/RSV assay is intended as an aid in  the diagnosis of influenza from Nasopharyngeal swab specimens and  should not be used as a sole basis for treatment. Nasal washings and  aspirates are unacceptable for Xpert Xpress SARS-CoV-2/FLU/RSV  testing.  Fact Sheet for Patients: PinkCheek.be  Fact Sheet for Healthcare Providers: GravelBags.it  This test is not yet approved or cleared by the [place] FDA and  has been authorized for detection and/or diagnosis of SARS-CoV-2 by  FDA under an Emergency Use Authorization (EUA). This EUA will remain  in effect (meaning this test can be used) for the duration of the  Covid-19 declaration under Section 564(b)(1) of the Act, 21  U.S.C. section 360bbb-3(b)(1), unless the authorization is  terminated or revoked. Performed at [facility], [address] [place], [place] [address]       Radiology Studies: DG Chest Portable 1 View  Result Date: 03/28/2020 CLINICAL DATA:  Chest pain EXAM: PORTABLE CHEST 1 VIEW COMPARISON:  07/04/2018 FINDINGS: Lungs are clear. No pneumothorax or pleural effusion. Cardiac size is mildly enlarged, unchanged. Pulmonary vascularity is normal. No acute bone abnormality. IMPRESSION: No active disease.  Stable cardiomegaly. Electronically Signed   By: Fidela Salisbury MD   On: 03/28/2020 22:16    Scheduled Meds: . aspirin EC  81 mg Oral Daily  . atorvastatin  80 mg Oral q1800  . carvedilol  6.25 mg Oral BID WC  . insulin aspart  0-20 Units Subcutaneous TID WC  . insulin aspart  0-5 Units Subcutaneous QHS  . levothyroxine  75 mcg Oral QAC breakfast  . [START ON 03/30/2020] losartan  25 mg Oral Daily  . predniSONE  40 mg Oral Q breakfast   Continuous Infusions: . heparin 1,050 Units/hr (03/29/20 0310)     LOS: 0 days   Time spent: 35 minutes   Alfonzia Woolum Loann Quill, MD [facility]  If  7PM-7AM, please contact night-coverage [email or web address] 03/29/2020, 1:17 PM

## 2020-03-29 NOTE — ED Notes (Addendum)
Notified OUma, NP of increasing trop. No new orders.

## 2020-03-29 NOTE — ED Notes (Signed)
Pt states that nitro helped her chest pain and it feels better but not completely gone. PT does not want another nitro at this time

## 2020-03-29 NOTE — ED Notes (Signed)
NOti

## 2020-03-29 NOTE — Consult Note (Signed)
Cardiology Consultation:   Patient ID: Kristin [name] MRN: [id number]; DOB: Mar 16, 1951  Admit date: 03/28/2020 Date of Consult: 03/29/2020  Primary Care Provider: Lorelee Market, MD Primary Cardiologist: Iverson Alamin, Dr. Rockey Situ rounding Primary Electrophysiologist:  None    Patient Profile:   Kristin [name] is a 69 y.o. 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 Damita Dunnings.  History of Present Illness:   [name] is a 69 year old female with history of diastolic CHF, severe aortic calcification with moderate stenosis, hypertension, chronic respiratory failure on home O2 at 3 L nasal cannula oxygen, COPD, OSA, hypothyroidism, DM2, anxiety, and GERD.  She has a history of diastolic CHF after admission 07/04/2018 due to acute COPD and heart failure exacerbation but has not been evaluated in cardiology office.  She underwent echo 06/2018 that showed EF 60 to 65%, mild LAE, severe calcification of the aortic valve with moderate stenosis.  She was discharged after receiving IV Lasix and prednisone treatment.  She was then seen at the heart failure clinic and lost to follow-up.  She does not have a regular cardiologist.  Since lost to follow-up, she has continued to take Lasix 40 mg daily, lisinopril 20 mg qd.  She reports a history of GERD, for which she takes Nexium.  She currently smokes but reports she did not smoke much on 11/12 and before her CP. She drinks one cup of coffee and mango energy drinks. She denies alcohol or drug use. She reports her last bowel movement as the AM of 11/12.  For the last week, she has noticed increasing lower extremity edema and abdominal distention, despite taking  her Lasix. The evening of 03/28/2020, and approximately 1 hour before her arrival, she reports that she ate dinner and then went to the bathroom to try on a bra that her daughter had just purchased for her.  While trying to get the new piece of clothing on, she started to experienced substernal chest pain that radiated to her left flank and under her arm and was described as sharp, constant, somewhat positional, not TTP, and lasting 20 minutes in duration.  The CP also radiated slightly into her neck. Associated symptoms included diaphoresis, cramping abdominal pain, nausea, shortness of breath, dizziness, racing heart rate, and palpitations.  She has never had chest pain like this before in the past. She presented to [facility] emergency department after calling EMS right after her CP on 03/28/2020. Initial vitals significant for HR 109 bpm, 90% on 3 L nasal cannula oxygen.  Labs showed leukocytosis with WBC 11.0, H&H stable, hypokalemia with potassium 3.6, AKI with creatinine 1.08 and BUN 16, hypoalbuminemia at 3.3, alk phosphatase 37.  High-sensitivity troponin 25  89  240.  Echo ordered. MPI has been declined at this time.   Heart Pathway Score:     Past Medical History:  Diagnosis Date  . Anxiety   . Asthma   . CHF (congestive heart failure) ([place])    2018  . COPD (chronic obstructive pulmonary disease) ([place])   . Diabetes mellitus without complication ([place])   . Dyspnea   . GERD (gastroesophageal reflux disease)   . History of hiatal hernia   . History of orthopnea   .  Hypothyroidism   . Neuropathy   . Oxygen deficiency    3L/HS  . Pain    BACK/DDD  . Peripheral vascular disease ([place])   . RLS (restless legs syndrome)   . Sleep apnea   . Wheezing     Past Surgical History:  Procedure Laterality Date  . BREAST SURGERY    . CATARACT EXTRACTION W/PHACO Right 03/02/2018   Procedure: CATARACT EXTRACTION PHACO AND INTRAOCULAR LENS PLACEMENT ([place]);  Surgeon: Marchia Meiers, MD;  Location: [facility]  ORS;  Service: Ophthalmology;  Laterality: Right;  [place] 01:15 CDE 16.46 Fluid pack lot # [id number]  . CATARACT EXTRACTION W/PHACO Left 04/27/2018   Procedure: CATARACT EXTRACTION PHACO AND INTRAOCULAR LENS PLACEMENT ([place])- LEFT DIABETIC;  Surgeon: Marchia Meiers, MD;  Location: [facility] ORS;  Service: Ophthalmology;  Laterality: Left;  Lot # [id number] H [place]: 00:46.3 CDE: 8.07   . CYST EXCISION     FOREHEAD  . FOOT SURGERY     CYST  . TUBAL LIGATION       Home Medications:  Prior to Admission medications   Medication Sig Start Date End Date Taking? Authorizing Provider  albuterol (PROAIR HFA) 108 (90 Base) MCG/ACT inhaler Inhale 2 puffs into the lungs every 4 (four) hours as needed for wheezing or shortness of breath.    Yes [provider]  aspirin EC 81 MG tablet Take 81 mg by mouth daily.   Yes [provider]  atorvastatin (LIPITOR) 80 MG tablet Take 80 mg by mouth at bedtime.  09/05/19  Yes [provider]  carvedilol (COREG) 3.125 MG tablet Take 1 tablet (3.125 mg total) by mouth 2 (two) times daily with a meal. 07/07/18  Yes Mayo, Pete Pelt, MD  citalopram (CELEXA) 40 MG tablet Take 40 mg by mouth daily.   Yes [provider]  diclofenac Sodium (VOLTAREN) 1 % GEL Apply 1 application topically 4 (four) times daily.  03/27/20  Yes [provider]  esomeprazole (NEXIUM) 40 MG capsule Take 40 mg by mouth daily. 10/01/19  Yes [provider]  fenofibrate 160 MG tablet Take 160 mg by mouth daily.   Yes [provider]  furosemide (LASIX) 40 MG tablet Take 40 mg by mouth daily. 03/19/20  Yes [provider]  gabapentin (NEURONTIN) 600 MG tablet Take 600 mg by mouth 2 (two) times daily.  11/20/19  Yes [provider]  hydrOXYzine (VISTARIL) 50 MG capsule Take 50 mg by mouth every 6 (six) hours as needed for anxiety. 03/14/20  Yes [provider]  levothyroxine (SYNTHROID, LEVOTHROID) 75 MCG tablet Take 75 mcg by mouth  daily before breakfast.   Yes [provider]  lisinopril (PRINIVIL,ZESTRIL) 20 MG tablet Take 20 mg by mouth daily.   Yes [provider]  metFORMIN (GLUCOPHAGE) 500 MG tablet Take 500 mg by mouth 2 (two) times daily. 09/19/19  Yes [provider]  MOVANTIK 12.5 MG TABS tablet Take 12.5 mg by mouth daily.  09/24/19  Yes [provider]  oxyCODONE-acetaminophen (PERCOCET/ROXICET) 5-325 MG tablet Take 1 tablet by mouth 3 (three) times daily.   Yes [provider]  OXYGEN Inhale 3 L into the lungs at bedtime.    Yes [provider]  sitaGLIPtin (JANUVIA) 100 MG tablet Take 100 mg by mouth daily.   Yes [provider]  Tiotropium Bromide Monohydrate (SPIRIVA RESPIMAT) 2.5 MCG/ACT AERS Inhale 2 puffs into the lungs daily.    Yes [provider]  ACCU-CHEK GUIDE test strip  09/25/19  [provider]    Inpatient Medications: Scheduled Meds: . aspirin EC  81 mg Oral Daily  . atorvastatin  80 mg Oral q1800  . carvedilol  3.125 mg Oral BID WC  . insulin aspart  0-20 Units Subcutaneous TID WC  . insulin aspart  0-5 Units Subcutaneous QHS  . levothyroxine  75 mcg Oral QAC breakfast  . predniSONE  40 mg Oral Q breakfast   Continuous Infusions: . heparin 1,050 Units/hr (03/29/20 0310)   PRN Meds: acetaminophen, albuterol, ipratropium-albuterol, nitroGLYCERIN, nitroGLYCERIN, ondansetron (ZOFRAN) IV  Allergies:    Allergies  Allergen Reactions  . Altace [Ramipril] Swelling    Social History:   Social History   Socioeconomic History  . Marital status: Legally Separated    Spouse name: Not on file  . Number of children: Not on file  . Years of education: Not on file  . Highest education level: Not on file  Occupational History  . Not on file  Tobacco Use  . Smoking status: Current Every Day Smoker  . Smokeless tobacco: Never Used  Substance and Sexual Activity  . Alcohol use: Yes  . Drug use: Not on file   . Sexual activity: Not on file  Other Topics Concern  . Not on file  Social History Narrative  . Not on file   Social Determinants of Health   Financial Resource Strain:   . Difficulty of Paying Living Expenses: Not on file  Food Insecurity:   . Worried About Charity fundraiser in the Last Year: Not on file  . Ran Out of Food in the Last Year: Not on file  Transportation Needs:   . Lack of Transportation (Medical): Not on file  . Lack of Transportation (Non-Medical): Not on file  Physical Activity:   . Days of Exercise per Week: Not on file  . Minutes of Exercise per Session: Not on file  Stress:   . Feeling of Stress : Not on file  Social Connections:   . Frequency of Communication with Friends and Family: Not on file  . Frequency of Social Gatherings with Friends and Family: Not on file  . Attends Religious Services: Not on file  . Active Member of Clubs or Organizations: Not on file  . Attends Archivist Meetings: Not on file  . Marital Status: Not on file  Intimate Partner Violence:   . Fear of Current or Ex-Partner: Not on file  . Emotionally Abused: Not on file  . Physically Abused: Not on file  . Sexually Abused: Not on file    Family History:   No reported family history of heart dz.  History reviewed. No pertinent family history.   ROS:  Please see the history of present illness.  Review of Systems  Respiratory: Positive for shortness of breath and wheezing. Negative for hemoptysis.   Cardiovascular: Positive for chest pain, palpitations and leg swelling. Negative for orthopnea.  Gastrointestinal: Positive for abdominal pain, heartburn and nausea. Negative for constipation, diarrhea and vomiting.  Genitourinary: Positive for flank pain.  Neurological: Positive for dizziness. Negative for loss of consciousness.  All other systems reviewed and are negative.   All other ROS reviewed and negative.     Physical Exam/Data:   Vitals:   03/29/20 0230  03/29/20 0300 03/29/20 0400 03/29/20 0600  BP: 139/65 (!) 142/87 (!) 143/82 (!) 141/62  Pulse: 69 69 76 70  Resp: '20 18 20 18  ' Temp:      SpO2: 97% 98%  97% 98%  Weight:      Height:       No intake or output data in the 24 hours ending 03/29/20 0737 Last 3 Weights 03/28/2020 10/09/2019 07/12/2018  Weight (lbs) 230 lb 235 lb 4 oz 237 lb 6 oz  Weight (kg) 104.327 kg 106.709 kg 107.673 kg     Body mass index is 42.07 kg/m.  General:  Well nourished, well developed, in no acute distress HEENT: normal Neck: no JVD Vascular: No carotid bruits; radial pulses 2+ bilaterally Cardiac:  normal S1, S2; RRR with extrasystole appreciated; RUSB 3/6 systolic murmur Lungs:  Distant breath sounds bilaterally, CTAB, no wheezing, rhonchi or rales  Abd: soft, nontender, no hepatomegaly  Ext: no significant pitting edema Musculoskeletal:  No deformities, BUE and BLE strength normal and equal Skin: warm and dry  Neuro:  No focal abnormalities noted Psych:  Normal affect   EKG:  The EKG was personally reviewed and demonstrates:  ST, 108bpm, PAC, prior anterior infarct / poor R wave progression, LVH, repolarization abnormalities as seen in previous EKGs, nonspecific changes Telemetry:  Telemetry was personally reviewed and demonstrates:  SR-ST, 70-160s, PVCs, PACs   Relevant CV Studies: Echo 06/2018 1. The left ventricle has normal systolic function with an ejection  fraction of 60-65%. The cavity size was normal. Left ventricular diastolic  parameters were normal.  2. The right ventricle has normal systolic function. The cavity was  normal. There is no increase in right ventricular wall thickness.  3. Left atrial size was mildly dilated.  4. The aortic valve has an indeterminant number of cusps Severe  calcifcation of the aortic valve. moderate stenosis of the aortic valve.  5. The inferior vena cava was dilated in size with >50% respiratory  variability.  6. Unable to estimate RVSP    Laboratory Data:  High Sensitivity Troponin:   Recent Labs  Lab 03/28/20 2206 03/28/20 2342 03/29/20 0151  TROPONINIHS 25* 89* 240*     Cardiac EnzymesNo results for input(s): TROPONINI in the last 168 hours. No results for input(s): TROPIPOC in the last 168 hours.  Chemistry Recent Labs  Lab 03/28/20 2341  NA 141  K 3.6  CL 99  CO2 31  GLUCOSE 152*  BUN 16  CREATININE 1.08*  CALCIUM 8.9  GFRNONAA 56*  ANIONGAP 11    Recent Labs  Lab 03/28/20 2341  PROT 6.9  ALBUMIN 3.3*  AST 19  ALT 14  ALKPHOS 37*  BILITOT 0.5   Hematology Recent Labs  Lab 03/28/20 2206  WBC 11.0*  RBC 4.69  HGB 13.5  HCT 42.4  MCV 90.4  MCH 28.8  MCHC 31.8  RDW 15.4  PLT 223   BNPNo results for input(s): BNP, PROBNP in the last 168 hours.  DDimer No results for input(s): DDIMER in the last 168 hours.   Radiology/Studies:  DG Chest Portable 1 View  Result Date: 03/28/2020 CLINICAL DATA:  Chest pain EXAM: PORTABLE CHEST 1 VIEW COMPARISON:  07/04/2018 FINDINGS: Lungs are clear. No pneumothorax or pleural effusion. Cardiac size is mildly enlarged, unchanged. Pulmonary vascularity is normal. No acute bone abnormality. IMPRESSION: No active disease.  Stable cardiomegaly. Electronically Signed   By: Fidela Salisbury MD   On: 03/28/2020 22:16    Assessment and Plan:   NSTEMI --Reported CP at time of interview. Previous CP atypical in nature and suspicious for GI etiology.  Also considered is CP 2/2 valvular dz as below with known AS. Consider HS Tn elevation 2/2 supply  demand ischemia, given Tn delta and elevated BP, HR, and AKI at presentation. At this time, presentation less consistent with ACS; however, cannot completely rule out CP 2/2 cardiac ischemia at this time, and thus further workup with stress testing and echo discussed with pt at length this AM with pt preference for echo only at this time. At this time, she is declining further workup with stress testing. Risk factors for  cardiac etiology include current smoker, age, obesity, and comorbid DM2. --HS Tn 25  89  240 and cycling. --Continue to cycle Tn until peaked, down-trending. --EKG without acute ST/T changes. --Ordered echo to reassess AV, EF, wall motion, and r/o acute structural abnormalities. --Ordered TSH, Mg. LDL ordered for further risk factor stratification.  --Per IM, was started on a statin.  --Per IM, started on IV heparin. Continue for now.   --MPI declined per pt as above, given previous poor experience with stress testing. --Continue current medications. Further recommendations, if needed, pending echo.   Aortic stenosis, moderate --Previous echo as above with moderate stenosis of the aortic valve and heavy calcification.   --Consider as etiology of CP, SOB. --Ordered updated echo to reassess. --Further recommendations pending echo.  HFpEF --Reports SOB, though at baseline at this time on home 3L [place] oxygen. --SOB likely multifactorial in setting of her COPD, HFpEF, AS, OSA, OHS. --Appears euvolemic on exam (with consideration of body habitus). --CXR without acute findings.  --Ordered updated echo. --Monitor I/O. --Daily wt. --Daily BMET. --Replete electrolytes. --CHF education. --ReDS vest. --PTA Lasix 20 mg daily, held due to AKI. --Ordered losartan with start date 11/14, given AKI. --Further recommendations, if indicated, pending echo.  Hypokalemia --K 3.6. --Ordered KCL tab 27mq x1. --Replete with goal 4.0. --Ordered Mg.   Leukocytosis --Daily CBC.  --Respiratory panel negative. --CXR without acute findings. --Nonspecific elevation. Monitor.  --Per IM.  HTN --BP suboptimal. --Given comorbid DM2, started losartan per GDMT. Given AKI, will start tomorrow. --Increased Coreg.  AOCKD --Losartan to start tomorrow given elevated Cr. --Baseline Cr 0.8-0.9.  DM2 --A1C 6.0. --Ordered ARB given comorbid HTN. --SSI, per IM.  Hypothyroidism --Ordered TSH.    For  questions or updates, please contact C[place]amakeePlease consult [email or web address] for contact info under     Signed, JArvil Chaco PA-C  03/29/2020 7:37 AM

## 2020-03-29 NOTE — ED Notes (Signed)
Dr. Charna Archer will call family to update on pt being admitted .

## 2020-03-29 NOTE — ED Notes (Signed)
MD rounding at bedside.

## 2020-03-29 NOTE — Progress Notes (Signed)
[place] for Heparin Indication: chest pain/ACS  Allergies  Allergen Reactions  . Altace [Ramipril] Swelling    Patient Measurements: Height: 5\' 2"  (157.5 cm) Weight: 105.3 kg (232 lb 3.2 oz) IBW/kg (Calculated) : 50.1 HEPARIN DW (KG): 75.4  Vital Signs: Temp: 98.2 F (36.8 C) (11/13 1957) Temp Source: Oral (11/13 1957) BP: 152/62 (11/13 1957) Pulse Rate: 76 (11/13 1957)  Labs: Recent Labs    03/28/20 2206 03/28/20 2341 03/28/20 2342 03/29/20 0151 03/29/20 1003 03/29/20 1236 03/29/20 1424 03/29/20 1950  HGB 13.5  --   --   --  13.1  --   --   --   HCT 42.4  --   --   --  41.5  --   --   --   PLT 223  --   --   --  216  --   --   --   HEPARINUNFRC  --   --   --   --  <0.10* <0.10*  --  0.19*  CREATININE  --  1.08*  --   --  0.93  --   --   --   TROPONINIHS 25*  --    < > 240* 248*  --  179*  --    < > = values in this interval not displayed.   Estimated Creatinine Clearance: 66.9 mL/min (by C-G formula based on SCr of 0.93 mg/dL).  Medical History: Past Medical History:  Diagnosis Date  . Anxiety   . Asthma   . CHF (congestive heart failure) ([place])    2018  . COPD (chronic obstructive pulmonary disease) ([place])   . Diabetes mellitus without complication ([place])   . Dyspnea   . GERD (gastroesophageal reflux disease)   . History of hiatal hernia   . History of orthopnea   . Hypothyroidism   . Neuropathy   . Oxygen deficiency    3L/HS  . Pain    BACK/DDD  . Peripheral vascular disease ([place])   . RLS (restless legs syndrome)   . Sleep apnea   . Wheezing    Medications:  Medications Prior to Admission  Medication Sig Dispense Refill Last Dose  . albuterol (PROAIR HFA) 108 (90 Base) MCG/ACT inhaler Inhale 2 puffs into the lungs every 4 (four) hours as needed for wheezing or shortness of breath.    prn at prn  . aspirin EC 81 MG tablet Take 81 mg by mouth daily.   03/28/2020 at Unknown time  . atorvastatin (LIPITOR) 80 MG  tablet Take 80 mg by mouth at bedtime.    Past Week at Unknown time  . carvedilol (COREG) 3.125 MG tablet Take 1 tablet (3.125 mg total) by mouth 2 (two) times daily with a meal. 60 tablet 0 03/28/2020 at 0730  . citalopram (CELEXA) 40 MG tablet Take 40 mg by mouth daily.   03/28/2020 at Unknown time  . diclofenac Sodium (VOLTAREN) 1 % GEL Apply 1 application topically 4 (four) times daily.    03/28/2020 at Unknown time  . esomeprazole (NEXIUM) 40 MG capsule Take 40 mg by mouth daily.   03/28/2020 at Unknown time  . fenofibrate 160 MG tablet Take 160 mg by mouth daily.   03/28/2020 at Unknown time  . furosemide (LASIX) 40 MG tablet Take 40 mg by mouth daily.   03/28/2020 at Unknown time  . gabapentin (NEURONTIN) 600 MG tablet Take 600 mg by mouth 2 (two) times daily.    03/28/2020 at  Unknown time  . hydrOXYzine (VISTARIL) 50 MG capsule Take 50 mg by mouth every 6 (six) hours as needed for anxiety.   prn at prn  . levothyroxine (SYNTHROID, LEVOTHROID) 75 MCG tablet Take 75 mcg by mouth daily before breakfast.   03/28/2020 at Unknown time  . lisinopril (PRINIVIL,ZESTRIL) 20 MG tablet Take 20 mg by mouth daily.   03/28/2020 at Unknown time  . metFORMIN (GLUCOPHAGE) 500 MG tablet Take 500 mg by mouth 2 (two) times daily.   03/28/2020 at 0730  . MOVANTIK 12.5 MG TABS tablet Take 12.5 mg by mouth daily.    03/28/2020 at Unknown time  . oxyCODONE-acetaminophen (PERCOCET/ROXICET) 5-325 MG tablet Take 1 tablet by mouth 3 (three) times daily.   03/28/2020 at Unknown time  . OXYGEN Inhale 3 L into the lungs at bedtime.    Past Week at Unknown time  . sitaGLIPtin (JANUVIA) 100 MG tablet Take 100 mg by mouth daily.   03/28/2020 at Unknown time  . Tiotropium Bromide Monohydrate (SPIRIVA RESPIMAT) 2.5 MCG/ACT AERS Inhale 2 puffs into the lungs daily.    03/28/2020 at Unknown time  . ACCU-CHEK GUIDE test strip        Assessment: No anticoagulants PTA per med list.  Heparin started for ACS.   Heparin 4000  units bolus x 1 then infusion at 1050 units/hr   03/29/20  HL@1003  <0.10. RN had moved Heparin IV to different hand so rechecked level. HL@1316  still < 0.10. Heparin 2200 units bolus x 1 then increase infusion to 1300 units/hr  Goal of Therapy:  Heparin level 0.3-0.7 units/ml Monitor platelets by anticoagulation protocol: Yes   Plan:   03/29/20 @ 1950 HL 0.19, subtherapeutic s/p Heparin 2200 units bolus x 1 and rate increase to 1300 units/hr. Confirmed no heparin interruptions or line issues.   Will order Heparin 2250 units bolus x 1 then increase infusion to 1500 units/hr - nursing aware of changes.   Check HL ~ 6 hours after heparin started.  CBC per protocol  Rowland Lathe 03/29/2020,8:42 PM

## 2020-03-29 NOTE — Plan of Care (Signed)

## 2020-03-29 NOTE — Progress Notes (Signed)
[place] [place] for Heparin Indication: chest pain/ACS  Allergies  Allergen Reactions  . Altace [Ramipril] Swelling    Patient Measurements: Height: 5\' 2"  (157.5 cm) Weight: 105.3 kg (232 lb 3.2 oz) IBW/kg (Calculated) : 50.1 HEPARIN DW (KG): 75.4  Vital Signs: Temp: 98.7 F (37.1 C) (11/13 1200) Temp Source: Oral (11/13 1200) BP: 139/64 (11/13 1200) Pulse Rate: 71 (11/13 1200)  Labs: Recent Labs    03/28/20 2206 03/28/20 2206 03/28/20 2341 03/28/20 2342 03/29/20 0151 03/29/20 1003 03/29/20 1236  HGB 13.5  --   --   --   --  13.1  --   HCT 42.4  --   --   --   --  41.5  --   PLT 223  --   --   --   --  216  --   HEPARINUNFRC  --   --   --   --   --  <0.10* <0.10*  CREATININE  --   --  1.08*  --   --  0.93  --   TROPONINIHS 25*   < >  --  89* 240* 248*  --    < > = values in this interval not displayed.   Estimated Creatinine Clearance: 66.9 mL/min (by C-G formula based on SCr of 0.93 mg/dL).  Medical History: Past Medical History:  Diagnosis Date  . Anxiety   . Asthma   . CHF (congestive heart failure) ([place])    2018  . COPD (chronic obstructive pulmonary disease) ([place])   . Diabetes mellitus without complication ([place])   . Dyspnea   . GERD (gastroesophageal reflux disease)   . History of hiatal hernia   . History of orthopnea   . Hypothyroidism   . Neuropathy   . Oxygen deficiency    3L/HS  . Pain    BACK/DDD  . Peripheral vascular disease ([place])   . RLS (restless legs syndrome)   . Sleep apnea   . Wheezing    Medications:  Medications Prior to Admission  Medication Sig Dispense Refill Last Dose  . albuterol (PROAIR HFA) 108 (90 Base) MCG/ACT inhaler Inhale 2 puffs into the lungs every 4 (four) hours as needed for wheezing or shortness of breath.    prn at prn  . aspirin EC 81 MG tablet Take 81 mg by mouth daily.   03/28/2020 at Unknown time  . atorvastatin (LIPITOR) 80 MG tablet Take 80 mg by mouth at bedtime.    Past  Week at Unknown time  . carvedilol (COREG) 3.125 MG tablet Take 1 tablet (3.125 mg total) by mouth 2 (two) times daily with a meal. 60 tablet 0 03/28/2020 at 0730  . citalopram (CELEXA) 40 MG tablet Take 40 mg by mouth daily.   03/28/2020 at Unknown time  . diclofenac Sodium (VOLTAREN) 1 % GEL Apply 1 application topically 4 (four) times daily.    03/28/2020 at Unknown time  . esomeprazole (NEXIUM) 40 MG capsule Take 40 mg by mouth daily.   03/28/2020 at Unknown time  . fenofibrate 160 MG tablet Take 160 mg by mouth daily.   03/28/2020 at Unknown time  . furosemide (LASIX) 40 MG tablet Take 40 mg by mouth daily.   03/28/2020 at Unknown time  . gabapentin (NEURONTIN) 600 MG tablet Take 600 mg by mouth 2 (two) times daily.    03/28/2020 at Unknown time  . hydrOXYzine (VISTARIL) 50 MG capsule Take 50 mg by mouth every 6 (six) hours  as needed for anxiety.   prn at prn  . levothyroxine (SYNTHROID, LEVOTHROID) 75 MCG tablet Take 75 mcg by mouth daily before breakfast.   03/28/2020 at Unknown time  . lisinopril (PRINIVIL,ZESTRIL) 20 MG tablet Take 20 mg by mouth daily.   03/28/2020 at Unknown time  . metFORMIN (GLUCOPHAGE) 500 MG tablet Take 500 mg by mouth 2 (two) times daily.   03/28/2020 at 0730  . MOVANTIK 12.5 MG TABS tablet Take 12.5 mg by mouth daily.    03/28/2020 at Unknown time  . oxyCODONE-acetaminophen (PERCOCET/ROXICET) 5-325 MG tablet Take 1 tablet by mouth 3 (three) times daily.   03/28/2020 at Unknown time  . OXYGEN Inhale 3 L into the lungs at bedtime.    Past Week at Unknown time  . sitaGLIPtin (JANUVIA) 100 MG tablet Take 100 mg by mouth daily.   03/28/2020 at Unknown time  . Tiotropium Bromide Monohydrate (SPIRIVA RESPIMAT) 2.5 MCG/ACT AERS Inhale 2 puffs into the lungs daily.    03/28/2020 at Unknown time  . ACCU-CHEK GUIDE test strip        Assessment: No anticoagulants PTA per med list.  Heparin started for ACS.   Heparin 4000 units bolus x 1 then infusion at 1050  units/hr  Goal of Therapy:  Heparin level 0.3-0.7 units/ml Monitor platelets by anticoagulation protocol: Yes   Plan:  03/29/20  HL@1003  <0.10. RN had moved Heparin IV to different hand so rechecked level. HL@1316  still < 0.10. Will order Heparin 2200 units bolus x 1 then increase infusion to 1300 units/hr Check HL ~ 6 hours after heparin started. CBC per protocol  Kristin [name] A 03/29/2020,1:34 PM

## 2020-03-29 NOTE — Progress Notes (Signed)
*  PRELIMINARY RESULTS* Echocardiogram 2D Echocardiogram has been performed. Definity IV Contrast used on this study.  Kristin [name] Kristin [name] 03/29/2020, 3:27 PM

## 2020-03-29 NOTE — H&P (Signed)
History and Physical    Kristin [name] JJK:[id number] DOB: 11/25/1950 DOA: 03/28/2020  PCP: Lorelee Market, MD   Patient coming from: Home  I have personally briefly reviewed patient's old medical records in [place]  Chief Complaint: Chest pain  HPI: Kristin [name] is a 69 y.o. female with medical history significant for DM, HTN, chronic respiratory failure on home O2 at 3 L, COPD, OSA, hypothyroidism, anxiety, GERD, diastolic CHF who presents to the emergency room for evaluation of retrosternal chest pressure associated with shortness of breath and nausea that started about an hour prior to arrival.  Pain was of moderate intensity, retrosternal radiating to the left arm and abdomen associated with nausea and belching.  No vomiting or diaphoresis.  No cough or fever.  Patient has wheezing and shortness of breath but says that this is her baseline..  Denies lower extremity pain or swelling.  Unclear what patient received with EMS but pain had resolved by arrival. ED Course: Patient was pain-free on arrival.  She was tachycardic at 109 with O2 sats 90% on 3 L and otherwise normal vitals.  First troponin 25 trending up to 89 with second set.  CMP and CBC mostly unremarkable.  Lipase 40 EKG as reviewed by me : Sinus tachycardia at 108 with no acute ST-T wave changes Chest x-ray: No acute disease While in the emergency room, patient had recurrent of chest pain and she was given aspirin sublingual nitro.  Hospitalist consulted for admission for chest pain rule out due to delta troponin.  Review of Systems: As per HPI otherwise all other systems on review of systems negative.    Past Medical History:  Diagnosis Date  . Anxiety   . Asthma   . CHF (congestive heart failure) ([place])    2018  . COPD (chronic obstructive pulmonary disease) ([place])   . Diabetes mellitus without complication ([place])   . Dyspnea   . GERD (gastroesophageal reflux disease)   . History of hiatal hernia   .  History of orthopnea   . Hypothyroidism   . Neuropathy   . Oxygen deficiency    3L/HS  . Pain    BACK/DDD  . Peripheral vascular disease ([place])   . RLS (restless legs syndrome)   . Sleep apnea   . Wheezing     Past Surgical History:  Procedure Laterality Date  . BREAST SURGERY    . CATARACT EXTRACTION W/PHACO Right 03/02/2018   Procedure: CATARACT EXTRACTION PHACO AND INTRAOCULAR LENS PLACEMENT ([place]);  Surgeon: Marchia Meiers, MD;  Location: [facility] ORS;  Service: Ophthalmology;  Laterality: Right;  [place] 01:15 CDE 16.46 Fluid pack lot # [id number]  . CATARACT EXTRACTION W/PHACO Left 04/27/2018   Procedure: CATARACT EXTRACTION PHACO AND INTRAOCULAR LENS PLACEMENT ([place])- LEFT DIABETIC;  Surgeon: Marchia Meiers, MD;  Location: [facility] ORS;  Service: Ophthalmology;  Laterality: Left;  Lot # [id number] H [place]: 00:46.3 CDE: 8.07   . CYST EXCISION     FOREHEAD  . FOOT SURGERY     CYST  . TUBAL LIGATION       reports that she has been smoking. She has never used smokeless tobacco. She reports current alcohol use. No history on file for drug use.  Allergies  Allergen Reactions  . Altace [Ramipril] Swelling    History reviewed. No pertinent family history.    Prior to Admission medications   Medication Sig Start Date End Date Taking? Authorizing Provider  ACCU-CHEK GUIDE test strip  09/25/19   [provider]  albuterol (PROAIR HFA) 108 (90 Base) MCG/ACT inhaler Inhale 2 puffs into the lungs every 6 (six) hours as needed for wheezing or shortness of breath.    [provider]  aspirin EC 81 MG tablet Take 81 mg by mouth daily.    [provider]  atorvastatin (LIPITOR) 80 MG tablet  09/05/19   [provider]  Calcium Carb-Cholecalciferol (CALCIUM 600+D3 PO) Take 1 tablet by mouth daily.    [provider]  carvedilol (COREG) 3.125 MG tablet Take 1 tablet (3.125 mg total) by mouth 2 (two) times daily with a meal. 07/07/18   Mayo, Pete Pelt, MD   citalopram (CELEXA) 40 MG tablet Take 40 mg by mouth daily.    [provider]  cyanocobalamin (,VITAMIN B-12,) 1000 MCG/ML injection Inject 1,000 mcg into the muscle every 30 (thirty) days.    [provider]  doxycycline (VIBRAMYCIN) 100 MG capsule Take 100 mg by mouth daily. 08/31/19   [provider]  esomeprazole (NEXIUM) 40 MG capsule Take 40 mg by mouth daily. 10/01/19   [provider]  famotidine (PEPCID) 40 MG tablet Take 40 mg by mouth daily. 08/04/19   [provider]  fenofibrate 160 MG tablet Take 160 mg by mouth daily.    [provider]  furosemide (LASIX) 20 MG tablet Take 1 tablet (20 mg total) by mouth every other day. 07/12/18   Alisa Graff, FNP  gabapentin (NEURONTIN) 600 MG tablet Take by mouth. 11/20/19   [provider]  levothyroxine (SYNTHROID, LEVOTHROID) 75 MCG tablet Take 75 mcg by mouth daily before breakfast.    [provider]  lidocaine (LIDODERM) 5 % SMARTSIG:Patch(s) Topical 11/20/19   [provider]  lisinopril (PRINIVIL,ZESTRIL) 20 MG tablet Take 20 mg by mouth daily.    [provider]  metFORMIN (GLUCOPHAGE) 500 MG tablet Take 500 mg by mouth 2 (two) times daily. 09/19/19   [provider]  MOVANTIK 25 MG TABS tablet  09/24/19   [provider]  Multiple Vitamins-Minerals (PRESERVISION AREDS PO) Take 1 tablet by mouth daily.     [provider]  omeprazole (PRILOSEC) 40 MG capsule Take 1 capsule (40 mg total) by mouth daily before breakfast. 10/09/19   Vanga, Tally Due, MD  oxyCODONE-acetaminophen (PERCOCET) 10-325 MG tablet Take 1 tablet by mouth 4 (four) times daily.     [provider]  OXYGEN Inhale 3 L into the lungs.    [provider]  ranitidine (ZANTAC) 150 MG tablet Take 150 mg by mouth 2 (two) times daily.    [provider]  sitaGLIPtin (JANUVIA) 100 MG tablet Take 100 mg by mouth daily.    [provider]  Tiotropium Bromide Monohydrate (SPIRIVA RESPIMAT) 1.25 MCG/ACT AERS Inhale 1 puff into the lungs 2 (two) times daily.     [provider]    Physical Exam: Vitals:   03/28/20 2330 03/29/20 0000 03/29/20 0100 03/29/20 0146  BP: 128/64 (!) 130/59 (!) 143/68 (!) 131/50  Pulse: 85 79 73 76  Resp:    (!) 22  Temp:      SpO2: 93% 93% 97% 97%  Weight:      Height:         Vitals:   03/28/20 2330 03/29/20 0000 03/29/20 0100 03/29/20 0146  BP: 128/64 (!) 130/59 (!) 143/68 (!) 131/50  Pulse: 85 79 73 76  Resp:    (!) 22  Temp:  SpO2: 93% 93% 97% 97%  Weight:      Height:          Constitutional: Alert and oriented x 3 . Not in any apparent distress HEENT:      Head: Normocephalic and atraumatic.         Eyes: PERLA, EOMI, Conjunctivae are normal. Sclera is non-icteric.       Mouth/Throat: Mucous membranes are moist.       Neck: Supple with no signs of meningismus. Cardiovascular: Regular rate and rhythm. No murmurs, gallops, or rubs. 2+ symmetrical distal pulses are present . No JVD. No LE edema Respiratory: Respiratory effort slightly increased.Lungs sounds diminished bilaterally.  Wheezing throughout both lung fields Gastrointestinal: Soft, non tender, and non distended with positive bowel sounds. No rebound or guarding. Genitourinary: No CVA tenderness. Musculoskeletal: Nontender with normal range of motion in all extremities. No cyanosis, or erythema of extremities. Neurologic:  Face is symmetric. Moving all extremities. No gross focal neurologic deficits . Skin: Skin is warm, dry.  No rash or ulcers Psychiatric: Mood and affect are normal    Labs on Admission: I have personally reviewed following labs and imaging studies  CBC: Recent Labs  Lab 03/28/20 2206  WBC 11.0*  HGB 13.5  HCT 42.4  MCV 90.4  PLT 938   Basic Metabolic Panel: Recent Labs  Lab 03/28/20 2341  NA 141  K 3.6  CL 99  CO2 31  GLUCOSE 152*  BUN 16  CREATININE  1.08*  CALCIUM 8.9   GFR: Estimated Creatinine Clearance: 57.3 mL/min (A) (by C-G formula based on SCr of 1.08 mg/dL (H)). Liver Function Tests: Recent Labs  Lab 03/28/20 2341  AST 19  ALT 14  ALKPHOS 37*  BILITOT 0.5  PROT 6.9  ALBUMIN 3.3*   Recent Labs  Lab 03/28/20 2341  LIPASE 40   No results for input(s): AMMONIA in the last 168 hours. Coagulation Profile: No results for input(s): INR, PROTIME in the last 168 hours. Cardiac Enzymes: No results for input(s): CKTOTAL, CKMB, CKMBINDEX, TROPONINI in the last 168 hours. BNP (last 3 results) No results for input(s): PROBNP in the last 8760 hours. HbA1C: No results for input(s): HGBA1C in the last 72 hours. CBG: No results for input(s): GLUCAP in the last 168 hours. Lipid Profile: No results for input(s): CHOL, HDL, LDLCALC, TRIG, CHOLHDL, LDLDIRECT in the last 72 hours. Thyroid Function Tests: No results for input(s): TSH, T4TOTAL, FREET4, T3FREE, THYROIDAB in the last 72 hours. Anemia Panel: No results for input(s): VITAMINB12, FOLATE, FERRITIN, TIBC, IRON, RETICCTPCT in the last 72 hours. Urine analysis:    Component Value Date/Time   COLORURINE STRAW (A) 07/04/2018 1954   APPEARANCEUR CLEAR (A) 07/04/2018 1954   LABSPEC 1.006 07/04/2018 1954   PHURINE 5.0 07/04/2018 [place] NEGATIVE 07/04/2018 1954   HGBUR NEGATIVE 07/04/2018 [place] NEGATIVE 07/04/2018 [place] NEGATIVE 07/04/2018 1954   PROTEINUR NEGATIVE 07/04/2018 1954   NITRITE NEGATIVE 07/04/2018 1954   LEUKOCYTESUR NEGATIVE 07/04/2018 1954    Radiological Exams on Admission: DG Chest Portable 1 View  Result Date: 03/28/2020 CLINICAL DATA:  Chest pain EXAM: PORTABLE CHEST 1 VIEW COMPARISON:  07/04/2018 FINDINGS: Lungs are clear. No pneumothorax or pleural effusion. Cardiac size is mildly enlarged, unchanged. Pulmonary vascularity is normal. No acute bone abnormality. IMPRESSION: No active disease.  Stable cardiomegaly.  Electronically Signed   By: Fidela Salisbury MD   On: 03/28/2020 22:16     Assessment/Plan 69 year old  female with history of DM, HTN, chronic respiratory failure on home O2 at 3 L, COPD, OSA, hypothyroidism, anxiety, GERD, diastolic CHF admitted for chest pain rule out    Chest pain   Elevated troponin -Patient with CAD risk factors presenting with chest pain with typical features, no acute EKG changes but with upward trending troponin 25>>89 -Heparin infusion -Aspirin beta blockers and statins -Cardiology consulted    Chronic diastolic (congestive) heart failure ([place]) -Patient appears euvolemic -Continue furosemide, lisinopril and Coreg pending med rec    Type 2 diabetes mellitus without complication ([place]) -Sliding scale insulin coverage  COPD with mild exacerbation Chronic respiratory failure with hypoxia OSA -Patient with wheezing throughout, mild exacerbation  -Prednisone x5 days -Continue Proventil.  DuoNeb as needed -Continue supplemental oxygen -Nighttime CPAP    Obesity, Class III, BMI 40-49.9 (morbid obesity) ([facility]) -This complicates overall prognosis and care  Essential hypertension -Continue home meds    Hypothyroidism -Continue levothyroxine  GERD -Continue home PPI/H2 blocker  Anxiety -Continue home meds    DVT prophylaxis: On full dose heparin Code Status: full code  Family Communication:  none  Disposition Plan: Back to previous home environment Consults called: Cardiology Status: Observation    Athena Masse MD [facility]     03/29/2020, 2:09 AM

## 2020-03-29 NOTE — Progress Notes (Signed)
ANTICOAGULATION CONSULT NOTE - Initial Consult  Pharmacy Consult for Heparin Indication: chest pain/ACS  Allergies  Allergen Reactions  . Altace [Ramipril] Swelling    Patient Measurements: Height: 5\' 2"  (157.5 cm) Weight: 104.3 kg (230 lb) IBW/kg (Calculated) : 50.1 HEPARIN DW (KG): 75.1  Vital Signs: Temp: 97.8 F (36.6 C) (11/12 2148) BP: 131/50 (11/13 0146) Pulse Rate: 76 (11/13 0146)  Labs: Recent Labs    03/28/20 2206 03/28/20 2341 03/28/20 2342  HGB 13.5  --   --   HCT 42.4  --   --   PLT 223  --   --   CREATININE  --  1.08*  --   TROPONINIHS 25*  --  89*   Estimated Creatinine Clearance: 57.3 mL/min (A) (by C-G formula based on SCr of 1.08 mg/dL (H)).  Medical History: Past Medical History:  Diagnosis Date  . Anxiety   . Asthma   . CHF (congestive heart failure) ([place])    2018  . COPD (chronic obstructive pulmonary disease) ([place])   . Diabetes mellitus without complication ([place])   . Dyspnea   . GERD (gastroesophageal reflux disease)   . History of hiatal hernia   . History of orthopnea   . Hypothyroidism   . Neuropathy   . Oxygen deficiency    3L/HS  . Pain    BACK/DDD  . Peripheral vascular disease ([place])   . RLS (restless legs syndrome)   . Sleep apnea   . Wheezing    Medications:  (Not in a hospital admission)   Assessment: No anticoagulants PTA per med list.  Heparin started for ACS.   Goal of Therapy:  Heparin level 0.3-0.7 units/ml Monitor platelets by anticoagulation protocol: Yes   Plan:  Heparin 4000 units bolus x 1 then infusion at 1050 units/hr Check HL ~ 6 hours after heparin started  Hart Robinsons A 03/29/2020,2:26 AM

## 2020-03-30 DIAGNOSIS — J432 Centrilobular emphysema: Secondary | ICD-10-CM | POA: Diagnosis not present

## 2020-03-30 DIAGNOSIS — J9611 Chronic respiratory failure with hypoxia: Secondary | ICD-10-CM

## 2020-03-30 DIAGNOSIS — I10 Essential (primary) hypertension: Secondary | ICD-10-CM | POA: Diagnosis not present

## 2020-03-30 DIAGNOSIS — R072 Precordial pain: Secondary | ICD-10-CM | POA: Diagnosis not present

## 2020-03-30 DIAGNOSIS — E039 Hypothyroidism, unspecified: Secondary | ICD-10-CM

## 2020-03-30 DIAGNOSIS — I5032 Chronic diastolic (congestive) heart failure: Secondary | ICD-10-CM | POA: Diagnosis not present

## 2020-03-30 LAB — BASIC METABOLIC PANEL
Anion gap: 6 (ref 5–15)
BUN: 23 mg/dL (ref 8–23)
CO2: 30 mmol/L (ref 22–32)
Calcium: 8.6 mg/dL — ABNORMAL LOW (ref 8.9–10.3)
Chloride: 100 mmol/L (ref 98–111)
Creatinine, Ser: 0.75 mg/dL (ref 0.44–1.00)
GFR, Estimated: >60 mL/min (ref >60)
Glucose, Bld: 129 mg/dL — ABNORMAL HIGH (ref 70–99)
Potassium: 4.3 mmol/L (ref 3.5–5.1)
Sodium: 136 mmol/L (ref 135–145)

## 2020-03-30 LAB — HEPARIN LEVEL (UNFRACTIONATED)
Heparin Unfractionated: 0.33 IU/mL (ref 0.30–0.70)
Heparin Unfractionated: 0.22 IU/mL — ABNORMAL LOW (ref 0.30–0.70)
Heparin Unfractionated: 0.35 IU/mL (ref 0.30–0.70)

## 2020-03-30 LAB — HIV ANTIBODY (ROUTINE TESTING W REFLEX): HIV Screen 4th Generation wRfx: NONREACTIVE

## 2020-03-30 LAB — GLUCOSE, CAPILLARY
Glucose-Capillary: 97 mg/dL (ref 70–99)
Glucose-Capillary: 113 mg/dL — ABNORMAL HIGH (ref 70–99)
Glucose-Capillary: 176 mg/dL — ABNORMAL HIGH (ref 70–99)
Glucose-Capillary: 144 mg/dL — ABNORMAL HIGH (ref 70–99)

## 2020-03-30 LAB — CBC
HCT: 38.8 % (ref 36.0–46.0)
Hemoglobin: 12.5 g/dL (ref 12.0–15.0)
MCH: 28.7 pg (ref 26.0–34.0)
MCHC: 32.2 g/dL (ref 30.0–36.0)
MCV: 89.2 fL (ref 80.0–100.0)
Platelets: 221 10*3/uL (ref 150–400)
RBC: 4.35 MIL/uL (ref 3.87–5.11)
RDW: 15 % (ref 11.5–15.5)
WBC: 11.6 10*3/uL — ABNORMAL HIGH (ref 4.0–10.5)
nRBC: 0 % (ref 0.0–0.2)

## 2020-03-30 MED ORDER — POLYETHYLENE GLYCOL 3350 17 G PO PACK
17.0000 g | PACK | Freq: Every day | ORAL | Status: DC
  Administered 2020-03-30 – 2020-04-02 (×3): 17 g via ORAL
  Filled 2020-03-30 (×3): qty 1

## 2020-03-30 MED ORDER — MELATONIN 5 MG PO TABS
5.0000 mg | ORAL_TABLET | Freq: Every day | ORAL | Status: DC
  Administered 2020-03-30 – 2020-04-01 (×3): 5 mg via ORAL
  Filled 2020-03-30 (×3): qty 1

## 2020-03-30 MED ORDER — ALUM & MAG HYDROXIDE-SIMETH 200-200-20 MG/5ML PO SUSP
30.0000 mL | ORAL | Status: DC | PRN
  Administered 2020-03-30: 30 mL via ORAL
  Filled 2020-03-30: qty 30

## 2020-03-30 MED ORDER — HEPARIN BOLUS VIA INFUSION
1100.0000 [IU] | Freq: Once | INTRAVENOUS | Status: AC
  Administered 2020-03-30: 1100 [IU] via INTRAVENOUS
  Filled 2020-03-30: qty 1100

## 2020-03-30 MED ORDER — OXYCODONE HCL 5 MG PO TABS
10.0000 mg | ORAL_TABLET | Freq: Once | ORAL | Status: AC
  Administered 2020-03-30: 10 mg via ORAL
  Filled 2020-03-30: qty 2

## 2020-03-30 NOTE — Progress Notes (Signed)
PROGRESS NOTE    Kristin [name]  WVP:[id number] DOB: 12/11/1950 DOA: 03/28/2020 PCP: Lorelee Market, MD   Brief Narrative:  Patient is a 69 year old female with past medical history of diabetes, hypertension, chronic respiratory failure on 3 L oxygen via nasal cannula at home secondary to underlying COPD, OSA, hypothyroidism, GERD, diastolic CHF presents to emergency department with retrosternal chest pain associated with shortness of breath and nausea.  ED course: Patient was pain-free on arrival.  She was tachycardic with O2 sats in the 90s on 3 L of oxygen, first troponin 25 trended trended up to 89.  Lipase: 40.  EKG: No ischemic changes.  Chest x-ray negative.  Cardiology consulted.  [facility] hospitalist consulted for admission for chest pain rule out ACS.  Assessment & Plan:  NSTEMI: -Risk factors including hypertension, hyperlipidemia, diabetes mellitus, ongoing tobacco abuse, obesity -Troponin trending up from 25-->29-->240.  Reviewed EKG: Nonspecific ST changes in inferior anterolateral leads -Continue heparin gtt. -Continue aspirin, beta-blocker and statin, nitro as needed for chest pain. -Cardiology consulted-appreciate input-likely cath tomorrow a.m. -Echo shows ejection fraction of 55 to 60% with grade 1 diastolic dysfunction with moderate to severe aortic stenosis. -Monitor vitals closely.  Chronic hypoxemic respiratory failure secondary to underlying COPD and ongoing tobacco abuse: At baseline. -Patient is requiring 3 L which is her baseline. -DuoNebs as needed.  Continue home inhalers.  Chest x-ray negative.  COVID-19 negative.  Continue prednisone 40 mg daily for 5 days.     Chronic diastolic CHF: Patient appears euvolemic on exam. -Echo as above.  BNP: 251 -Continue aspirin, statin, Coreg.   -Strict INO's and daily weight.  Monitor electrolytes closely  Moderate to severe aortic stenosis: -Reviewed echo.  Hypertension: Stable -Continue Coreg and losartan.   Continue to monitor blood pressure closely.  Type 2 diabetes mellitus: Well controlled.  A1c 6.0% -Continue sliding scale insulin.  Hypothyroidism: TSH: WNL.  Continue levothyroxine  Tobacco abuse: Counseled about cessation  Morbid obesity with BMI of 42 -Diet modification/exercise and weight loss recommended  Leukocytosis: Likely secondary to prednisone -No signs of infection.  Chest x-ray negative.  COVID-19 negative.  No indication of antibiotics at this time.  AKI: Resolved  OSA: Nighttime CPAP  DVT prophylaxis: Heparin GTT Code Status: Full code Family Communication:  None present at bedside.  Plan of care discussed with patient in length and she verbalized understanding and agreed with it. Disposition Plan: Home  Consultants:   Cardiology  Procedures:   None  Antimicrobials:   None  Status is: Observation  Dispo: The patient is from: Home              Anticipated d/c is to: Home              Anticipated d/c date is: 03/30/2020              Patient currently not medically stable for the discharge   Subjective: Patient seen and examined.  Tells me that her chest pain has resolved however she continues to have shortness of breath and she feels that her stomach is swollen, she denies palpitation, leg swelling, orthopnea or PND.  She continues to smoke half pack of cigarettes per day even though she is on oxygen at home.  Objective: Vitals:   03/29/20 1200 03/29/20 1957 03/30/20 0425 03/30/20 0700  BP: 139/64 (!) 152/62 135/66 (!) 143/59  Pulse: 71 76 67 80  Resp: 18     Temp: 98.7 F (37.1 C) 98.2 F (36.8 C) 97.7  F (36.5 C) 97.8 F (36.6 C)  TempSrc: Oral Oral Oral Oral  SpO2: 97% 96% 97% 92%  Weight:   105.5 kg   Height:        Intake/Output Summary (Last 24 hours) at 03/30/2020 1115 Last data filed at 03/30/2020 0947 Gross per 24 hour  Intake 1321.25 ml  Output 1100 ml  Net 221.25 ml   Filed Weights   03/28/20 2157 03/29/20 0929 03/30/20  0425  Weight: 104.3 kg 105.3 kg 105.5 kg    Examination:  General exam: Appears calm and comfortable, on 3 L of oxygen via nasal cannula, obese, communicating Respiratory system: Clear to auscultation. Respiratory effort normal. Cardiovascular system: S1 & S2 heard, RRR. No JVD, murmurs, rubs, gallops or clicks. No pedal edema. Gastrointestinal system: Abdomen is nondistended, soft and nontender. No organomegaly or masses felt. Normal bowel sounds heard. Central nervous system: Alert and oriented. No focal neurological deficits. Extremities: Symmetric 5 x 5 power. Skin: Multiple skin lesions noted on upper back  psychiatry: Judgement and insight appear normal. Mood & affect appropriate.    Data Reviewed: I have personally reviewed following labs and imaging studies  CBC: Recent Labs  Lab 03/28/20 2206 03/29/20 1003 03/30/20 0336  WBC 11.0* 8.3 11.6*  HGB 13.5 13.1 12.5  HCT 42.4 41.5 38.8  MCV 90.4 90.4 89.2  PLT 223 216 893   Basic Metabolic Panel: Recent Labs  Lab 03/28/20 2341 03/29/20 1003 03/30/20 0336  NA 141 139 136  K 3.6 3.9 4.3  CL 99 97* 100  CO2 31 34* 30  GLUCOSE 152* 116* 129*  BUN 16 16 23   CREATININE 1.08* 0.93 0.75  CALCIUM 8.9 9.0 8.6*  MG  --  1.7  --    GFR: Estimated Creatinine Clearance: 77.9 mL/min (by C-G formula based on SCr of 0.75 mg/dL). Liver Function Tests: Recent Labs  Lab 03/28/20 2341  AST 19  ALT 14  ALKPHOS 37*  BILITOT 0.5  PROT 6.9  ALBUMIN 3.3*   Recent Labs  Lab 03/28/20 2341  LIPASE 40   No results for input(s): AMMONIA in the last 168 hours. Coagulation Profile: No results for input(s): INR, PROTIME in the last 168 hours. Cardiac Enzymes: No results for input(s): CKTOTAL, CKMB, CKMBINDEX, TROPONINI in the last 168 hours. BNP (last 3 results) No results for input(s): PROBNP in the last 8760 hours. HbA1C: Recent Labs    03/28/20 2206  HGBA1C 6.0*   CBG: Recent Labs  Lab 03/29/20 1153 03/29/20 1636  03/29/20 2104 03/29/20 2323 03/30/20 0753  GLUCAP 128* 144* 217* 118* 97   Lipid Profile: Recent Labs    03/29/20 1003  CHOL 193  HDL 52  LDLCALC 113*  TRIG 140  CHOLHDL 3.7   Thyroid Function Tests: Recent Labs    03/29/20 1003  TSH 0.623   Anemia Panel: No results for input(s): VITAMINB12, FOLATE, FERRITIN, TIBC, IRON, RETICCTPCT in the last 72 hours. Sepsis Labs: No results for input(s): PROCALCITON, LATICACIDVEN in the last 168 hours.  Recent Results (from the past 240 hour(s))  Respiratory Panel by RT PCR (Flu A&B, Covid) - Nasopharyngeal Swab     Status: None   Collection Time: 03/28/20 10:06 PM   Specimen: Nasopharyngeal Swab  Result Value Ref Range Status   SARS Coronavirus 2 by RT PCR NEGATIVE NEGATIVE Final    Comment: (NOTE) SARS-CoV-2 target nucleic acids are NOT DETECTED.  The SARS-CoV-2 RNA is generally detectable in upper respiratoy specimens during the acute phase of  infection. The lowest concentration of SARS-CoV-2 viral copies this assay can detect is 131 copies/mL. A negative result does not preclude SARS-Cov-2 infection and should not be used as the sole basis for treatment or other patient management decisions. A negative result may occur with  improper specimen collection/handling, submission of specimen other than nasopharyngeal swab, presence of viral mutation(s) within the areas targeted by this assay, and inadequate number of viral copies (<131 copies/mL). A negative result must be combined with clinical observations, patient history, and epidemiological information. The expected result is Negative.  Fact Sheet for Patients:  PinkCheek.be  Fact Sheet for Healthcare Providers:  GravelBags.it  This test is no t yet approved or cleared by the [place] FDA and  has been authorized for detection and/or diagnosis of SARS-CoV-2 by FDA under an Emergency Use Authorization (EUA).  This EUA will remain  in effect (meaning this test can be used) for the duration of the COVID-19 declaration under Section 564(b)(1) of the Act, 21 U.S.C. section 360bbb-3(b)(1), unless the authorization is terminated or revoked sooner.     Influenza A by PCR NEGATIVE NEGATIVE Final   Influenza B by PCR NEGATIVE NEGATIVE Final    Comment: (NOTE) The Xpert Xpress SARS-CoV-2/FLU/RSV assay is intended as an aid in  the diagnosis of influenza from Nasopharyngeal swab specimens and  should not be used as a sole basis for treatment. Nasal washings and  aspirates are unacceptable for Xpert Xpress SARS-CoV-2/FLU/RSV  testing.  Fact Sheet for Patients: PinkCheek.be  Fact Sheet for Healthcare Providers: GravelBags.it  This test is not yet approved or cleared by the [place] FDA and  has been authorized for detection and/or diagnosis of SARS-CoV-2 by  FDA under an Emergency Use Authorization (EUA). This EUA will remain  in effect (meaning this test can be used) for the duration of the  Covid-19 declaration under Section 564(b)(1) of the Act, 21  U.S.C. section 360bbb-3(b)(1), unless the authorization is  terminated or revoked. Performed at [facility], [address] [place], [place] [address]       Radiology Studies: DG Chest Portable 1 View  Result Date: 03/28/2020 CLINICAL DATA:  Chest pain EXAM: PORTABLE CHEST 1 VIEW COMPARISON:  07/04/2018 FINDINGS: Lungs are clear. No pneumothorax or pleural effusion. Cardiac size is mildly enlarged, unchanged. Pulmonary vascularity is normal. No acute bone abnormality. IMPRESSION: No active disease.  Stable cardiomegaly. Electronically Signed   By: Fidela Salisbury MD   On: 03/28/2020 22:16   ECHOCARDIOGRAM COMPLETE  Result Date: 03/29/2020    ECHOCARDIOGRAM REPORT   Patient Name:   Kristin [name] Date of Exam: 03/29/2020 Medical Rec #:  [id number]     Height:       62.0 in  Accession #:    [id number]    Weight:       232.2 lb Date of Birth:  [birth year]    BSA:          2.037 m Patient Age:    83 years      BP:           139/64 mmHg Patient Gender: F             HR:           56 bpm. Exam Location:  [facility] Procedure: 2D Echo and Intracardiac Opacification Agent Indications:     Aortic Stenosis 135.0                  Chest Pain R07.9  History:         Patient has prior history of Echocardiogram examinations, most                  recent 07/05/2018.  Sonographer:     [place] Referring Phys:  [id number] Arvil Chaco Diagnosing Phys: Ida Rogue MD IMPRESSIONS  1. Left ventricular ejection fraction, by estimation, is 55 to 60%. The left ventricle has normal function. The left ventricle has no regional wall motion abnormalities. There is mild left ventricular hypertrophy. Left ventricular diastolic parameters are consistent with Grade I diastolic dysfunction (impaired relaxation).  2. Right ventricular systolic function is normal. The right ventricular size is normal. Tricuspid regurgitation signal is inadequate for assessing PA pressure.  3. Left atrial size was mildly dilated.  4. The aortic valve was not well visualized. Moderate to severe aortic valve stenosis. Aortic valve area, by VTI measures 1.11 cm. Aortic valve mean gradient measures 31.0 mmHg. Aortic valve Vmax measures 3.66 m/s. FINDINGS  Left Ventricle: Left ventricular ejection fraction, by estimation, is 55 to 60%. The left ventricle has normal function. The left ventricle has no regional wall motion abnormalities. Definity contrast agent was given IV to delineate the left ventricular  endocardial borders. The left ventricular internal cavity size was normal in size. There is mild left ventricular hypertrophy. Left ventricular diastolic parameters are consistent with Grade I diastolic dysfunction (impaired relaxation). Right Ventricle: The right ventricular size is normal. No increase in right ventricular  wall thickness. Right ventricular systolic function is normal. Tricuspid regurgitation signal is inadequate for assessing PA pressure. Left Atrium: Left atrial size was mildly dilated. Right Atrium: Right atrial size was normal in size. Pericardium: There is no evidence of pericardial effusion. Mitral Valve: The mitral valve is normal in structure. Moderate mitral annular calcification. No evidence of mitral valve regurgitation. No evidence of mitral valve stenosis. Tricuspid Valve: The tricuspid valve is normal in structure. Tricuspid valve regurgitation is not demonstrated. No evidence of tricuspid stenosis. Aortic Valve: The aortic valve was not well visualized. Aortic valve regurgitation is not visualized. Moderate to severe aortic stenosis is present. Aortic valve mean gradient measures 31.0 mmHg. Aortic valve peak gradient measures 53.6 mmHg. Aortic valve area, by VTI measures 1.11 cm. Pulmonic Valve: The pulmonic valve was normal in structure. Pulmonic valve regurgitation is not visualized. No evidence of pulmonic stenosis. Aorta: The aortic root is normal in size and structure. Venous: The inferior vena cava is normal in size with greater than 50% respiratory variability, suggesting right atrial pressure of 3 mmHg. IAS/Shunts: No atrial level shunt detected by color flow Doppler.  LEFT VENTRICLE PLAX 2D LVIDd:         4.87 cm  Diastology LVIDs:         3.40 cm  LV e' medial:    4.13 cm/s LV PW:         1.22 cm  LV E/e' medial:  23.6 LV IVS:        1.32 cm  LV e' lateral:   7.62 cm/s LVOT diam:     1.90 cm  LV E/e' lateral: 12.8 LV SV:         95 LV SV Index:   47 LVOT Area:     2.84 cm  LEFT ATRIUM           Index LA diam:      4.70 cm 2.31 cm/m LA Vol (A4C): 23.7 ml 11.64 ml/m  AORTIC VALVE AV Area (  Vmax):    0.98 cm AV Area (Vmean):   0.99 cm AV Area (VTI):     1.11 cm AV Vmax:           366.00 cm/s AV Vmean:          264.000 cm/s AV VTI:            0.858 m AV Peak Grad:      53.6 mmHg AV Mean Grad:       31.0 mmHg LVOT Vmax:         126.00 cm/s LVOT Vmean:        92.100 cm/s LVOT VTI:          0.335 m LVOT/AV VTI ratio: 0.39  AORTA Ao Root diam: 2.80 cm MITRAL VALVE MV Area (PHT): 4.54 cm     SHUNTS MV Decel Time: 167 msec     Systemic VTI:  0.34 m MV E velocity: 97.50 cm/s   Systemic Diam: 1.90 cm MV A velocity: 105.00 cm/s MV E/A ratio:  0.93 Ida Rogue MD Electronically signed by Ida Rogue MD Signature Date/Time: 03/29/2020/4:45:18 PM    Final     Scheduled Meds: . aspirin EC  81 mg Oral Daily  . atorvastatin  80 mg Oral q1800  . carvedilol  6.25 mg Oral BID WC  . insulin aspart  0-20 Units Subcutaneous TID WC  . insulin aspart  0-5 Units Subcutaneous QHS  . levothyroxine  75 mcg Oral QAC breakfast  . losartan  25 mg Oral Daily  . predniSONE  40 mg Oral Q breakfast   Continuous Infusions: . heparin 1,500 Units/hr (03/29/20 2138)     LOS: 1 day   Time spent: 35 minutes   Johanna Stafford Loann Quill, MD [facility]  If 7PM-7AM, please contact night-coverage [email or web address] 03/30/2020, 11:15 AM

## 2020-03-30 NOTE — Progress Notes (Signed)
[place] for Heparin Indication: chest pain/ACS  Allergies  Allergen Reactions  . Altace [Ramipril] Swelling    Patient Measurements: Height: 5\' 2"  (157.5 cm) Weight: 105.5 kg (232 lb 8 oz) IBW/kg (Calculated) : 50.1 HEPARIN DW (KG): 75.4  Vital Signs: Temp: 98.3 F (36.8 C) (11/14 1100) Temp Source: Oral (11/14 1100) BP: 140/66 (11/14 1100) Pulse Rate: 69 (11/14 1100)  Labs: Recent Labs    03/28/20 2206 03/28/20 2206 03/28/20 2341 03/28/20 2342 03/29/20 0151 03/29/20 1003 03/29/20 1236 03/29/20 1424 03/29/20 1950 03/30/20 0336 03/30/20 1038  HGB 13.5   < >  --   --   --  13.1  --   --   --  12.5  --   HCT 42.4  --   --   --   --  41.5  --   --   --  38.8  --   PLT 223  --   --   --   --  216  --   --   --  221  --   HEPARINUNFRC  --   --   --   --   --  <0.10*   < >  --  0.19* 0.33 0.22*  CREATININE  --   --  1.08*  --   --  0.93  --   --   --  0.75  --   TROPONINIHS 25*  --   --    < > 240* 248*  --  179*  --   --   --    < > = values in this interval not displayed.   Estimated Creatinine Clearance: 77.9 mL/min (by C-G formula based on SCr of 0.75 mg/dL).  Medical History: Past Medical History:  Diagnosis Date  . Anxiety   . Asthma   . CHF (congestive heart failure) ([place])    2018  . COPD (chronic obstructive pulmonary disease) ([place])   . Diabetes mellitus without complication ([place])   . Dyspnea   . GERD (gastroesophageal reflux disease)   . History of hiatal hernia   . History of orthopnea   . Hypothyroidism   . Neuropathy   . Oxygen deficiency    3L/HS  . Pain    BACK/DDD  . Peripheral vascular disease ([place])   . RLS (restless legs syndrome)   . Sleep apnea   . Wheezing    Medications:  Medications Prior to Admission  Medication Sig Dispense Refill Last Dose  . albuterol (PROAIR HFA) 108 (90 Base) MCG/ACT inhaler Inhale 2 puffs into the lungs every 4 (four) hours as needed for wheezing or shortness of  breath.    prn at prn  . aspirin EC 81 MG tablet Take 81 mg by mouth daily.   03/28/2020 at Unknown time  . atorvastatin (LIPITOR) 80 MG tablet Take 80 mg by mouth at bedtime.    Past Week at Unknown time  . carvedilol (COREG) 3.125 MG tablet Take 1 tablet (3.125 mg total) by mouth 2 (two) times daily with a meal. 60 tablet 0 03/28/2020 at 0730  . citalopram (CELEXA) 40 MG tablet Take 40 mg by mouth daily.   03/28/2020 at Unknown time  . diclofenac Sodium (VOLTAREN) 1 % GEL Apply 1 application topically 4 (four) times daily.    03/28/2020 at Unknown time  . esomeprazole (NEXIUM) 40 MG capsule Take 40 mg by mouth daily.   03/28/2020 at Unknown time  . fenofibrate 160 MG tablet  Take 160 mg by mouth daily.   03/28/2020 at Unknown time  . furosemide (LASIX) 40 MG tablet Take 40 mg by mouth daily.   03/28/2020 at Unknown time  . gabapentin (NEURONTIN) 600 MG tablet Take 600 mg by mouth 2 (two) times daily.    03/28/2020 at Unknown time  . hydrOXYzine (VISTARIL) 50 MG capsule Take 50 mg by mouth every 6 (six) hours as needed for anxiety.   prn at prn  . levothyroxine (SYNTHROID, LEVOTHROID) 75 MCG tablet Take 75 mcg by mouth daily before breakfast.   03/28/2020 at Unknown time  . lisinopril (PRINIVIL,ZESTRIL) 20 MG tablet Take 20 mg by mouth daily.   03/28/2020 at Unknown time  . metFORMIN (GLUCOPHAGE) 500 MG tablet Take 500 mg by mouth 2 (two) times daily.   03/28/2020 at 0730  . MOVANTIK 12.5 MG TABS tablet Take 12.5 mg by mouth daily.    03/28/2020 at Unknown time  . oxyCODONE-acetaminophen (PERCOCET/ROXICET) 5-325 MG tablet Take 1 tablet by mouth 3 (three) times daily.   03/28/2020 at Unknown time  . OXYGEN Inhale 3 L into the lungs at bedtime.    Past Week at Unknown time  . sitaGLIPtin (JANUVIA) 100 MG tablet Take 100 mg by mouth daily.   03/28/2020 at Unknown time  . Tiotropium Bromide Monohydrate (SPIRIVA RESPIMAT) 2.5 MCG/ACT AERS Inhale 2 puffs into the lungs daily.    03/28/2020 at Unknown  time  . ACCU-CHEK GUIDE test strip        Assessment: No anticoagulants PTA per med list.  Heparin started for ACS.   Heparin 4000 units bolus x 1 then infusion at 1050 units/hr  03/29/20  HL@1003  <0.10. RN had moved Heparin IV to different hand so rechecked level. HL@1316  still < 0.10. Heparin 2200 units bolus x 1 then increase infusion to 1300 units/hr 03/30/20 @ 0336 HL 0.33, therapeutic x 1 after rate increase .Will continue infusion at 1500 units/hr   Goal of Therapy:  Heparin level 0.3-0.7 units/ml Monitor platelets by anticoagulation protocol: Yes   Plan:   03/30/20 @1038   HL 0.22, subtherapeutic.  Will order bolus of 1100 units and  increase infusion to 1650 units/hr  - likely cath tomorrow a.m.11/15  Check HL ~ 6 hours to confirm   CBC per protocol  Kristin [name] A 03/30/2020,11:43 AM

## 2020-03-30 NOTE — Plan of Care (Signed)

## 2020-03-30 NOTE — Progress Notes (Signed)
[place] for Heparin Indication: chest pain/ACS  Allergies  Allergen Reactions  . Altace [Ramipril] Swelling    Patient Measurements: Height: 5\' 2"  (157.5 cm) Weight: 105.5 kg (232 lb 8 oz) IBW/kg (Calculated) : 50.1 HEPARIN DW (KG): 75.4  Vital Signs: Temp: 97.7 F (36.5 C) (11/14 0425) Temp Source: Oral (11/14 0425) BP: 135/66 (11/14 0425) Pulse Rate: 67 (11/14 0425)  Labs: Recent Labs    03/28/20 2206 03/28/20 2206 03/28/20 2341 03/28/20 2342 03/29/20 0151 03/29/20 1003 03/29/20 1003 03/29/20 1236 03/29/20 1424 03/29/20 1950 03/30/20 0336  HGB 13.5   < >  --   --   --  13.1  --   --   --   --  12.5  HCT 42.4  --   --   --   --  41.5  --   --   --   --  38.8  PLT 223  --   --   --   --  216  --   --   --   --  221  HEPARINUNFRC  --   --   --   --   --  <0.10*   < > <0.10*  --  0.19* 0.33  CREATININE  --   --  1.08*  --   --  0.93  --   --   --   --  0.75  TROPONINIHS 25*  --   --    < > 240* 248*  --   --  179*  --   --    < > = values in this interval not displayed.   Estimated Creatinine Clearance: 77.9 mL/min (by C-G formula based on SCr of 0.75 mg/dL).  Medical History: Past Medical History:  Diagnosis Date  . Anxiety   . Asthma   . CHF (congestive heart failure) ([place])    2018  . COPD (chronic obstructive pulmonary disease) ([place])   . Diabetes mellitus without complication ([place])   . Dyspnea   . GERD (gastroesophageal reflux disease)   . History of hiatal hernia   . History of orthopnea   . Hypothyroidism   . Neuropathy   . Oxygen deficiency    3L/HS  . Pain    BACK/DDD  . Peripheral vascular disease ([place])   . RLS (restless legs syndrome)   . Sleep apnea   . Wheezing    Medications:  Medications Prior to Admission  Medication Sig Dispense Refill Last Dose  . albuterol (PROAIR HFA) 108 (90 Base) MCG/ACT inhaler Inhale 2 puffs into the lungs every 4 (four) hours as needed for wheezing or shortness of  breath.    prn at prn  . aspirin EC 81 MG tablet Take 81 mg by mouth daily.   03/28/2020 at Unknown time  . atorvastatin (LIPITOR) 80 MG tablet Take 80 mg by mouth at bedtime.    Past Week at Unknown time  . carvedilol (COREG) 3.125 MG tablet Take 1 tablet (3.125 mg total) by mouth 2 (two) times daily with a meal. 60 tablet 0 03/28/2020 at 0730  . citalopram (CELEXA) 40 MG tablet Take 40 mg by mouth daily.   03/28/2020 at Unknown time  . diclofenac Sodium (VOLTAREN) 1 % GEL Apply 1 application topically 4 (four) times daily.    03/28/2020 at Unknown time  . esomeprazole (NEXIUM) 40 MG capsule Take 40 mg by mouth daily.   03/28/2020 at Unknown time  . fenofibrate 160 MG tablet  Take 160 mg by mouth daily.   03/28/2020 at Unknown time  . furosemide (LASIX) 40 MG tablet Take 40 mg by mouth daily.   03/28/2020 at Unknown time  . gabapentin (NEURONTIN) 600 MG tablet Take 600 mg by mouth 2 (two) times daily.    03/28/2020 at Unknown time  . hydrOXYzine (VISTARIL) 50 MG capsule Take 50 mg by mouth every 6 (six) hours as needed for anxiety.   prn at prn  . levothyroxine (SYNTHROID, LEVOTHROID) 75 MCG tablet Take 75 mcg by mouth daily before breakfast.   03/28/2020 at Unknown time  . lisinopril (PRINIVIL,ZESTRIL) 20 MG tablet Take 20 mg by mouth daily.   03/28/2020 at Unknown time  . metFORMIN (GLUCOPHAGE) 500 MG tablet Take 500 mg by mouth 2 (two) times daily.   03/28/2020 at 0730  . MOVANTIK 12.5 MG TABS tablet Take 12.5 mg by mouth daily.    03/28/2020 at Unknown time  . oxyCODONE-acetaminophen (PERCOCET/ROXICET) 5-325 MG tablet Take 1 tablet by mouth 3 (three) times daily.   03/28/2020 at Unknown time  . OXYGEN Inhale 3 L into the lungs at bedtime.    Past Week at Unknown time  . sitaGLIPtin (JANUVIA) 100 MG tablet Take 100 mg by mouth daily.   03/28/2020 at Unknown time  . Tiotropium Bromide Monohydrate (SPIRIVA RESPIMAT) 2.5 MCG/ACT AERS Inhale 2 puffs into the lungs daily.    03/28/2020 at Unknown  time  . ACCU-CHEK GUIDE test strip        Assessment: No anticoagulants PTA per med list.  Heparin started for ACS.   Heparin 4000 units bolus x 1 then infusion at 1050 units/hr  03/29/20  HL@1003  <0.10. RN had moved Heparin IV to different hand so rechecked level. HL@1316  still < 0.10. Heparin 2200 units bolus x 1 then increase infusion to 1300 units/hr  Goal of Therapy:  Heparin level 0.3-0.7 units/ml Monitor platelets by anticoagulation protocol: Yes   Plan:   03/30/20 @ 0336 HL 0.33, therapeutic x 1 after rate increase   Will continue infusion at 1500 units/hr   Check HL ~ 6 hours to confirm   CBC per protocol  Hart Robinsons A 03/30/2020,4:37 AM

## 2020-03-30 NOTE — Progress Notes (Signed)
Progress Note  Patient Name: Kristin [name] Date of Encounter: 03/30/2020  [facility] Cardiologist: new to [facility]  Subjective   Poor night sleep, reports having restless legs Did not ambulate much yesterday during the daytime Denies significant chest pain, reports her breathing is stable on oxygen  Inpatient Medications    Scheduled Meds: . aspirin EC  81 mg Oral Daily  . atorvastatin  80 mg Oral q1800  . carvedilol  6.25 mg Oral BID WC  . insulin aspart  0-20 Units Subcutaneous TID WC  . insulin aspart  0-5 Units Subcutaneous QHS  . levothyroxine  75 mcg Oral QAC breakfast  . losartan  25 mg Oral Daily  . predniSONE  40 mg Oral Q breakfast   Continuous Infusions: . heparin 1,650 Units/hr (03/30/20 1242)   PRN Meds: acetaminophen, albuterol, alum & mag hydroxide-simeth, ipratropium-albuterol, nitroGLYCERIN, nitroGLYCERIN, ondansetron (ZOFRAN) IV   Vital Signs    Vitals:   03/29/20 1957 03/30/20 0425 03/30/20 0700 03/30/20 1100  BP: (!) 152/62 135/66 (!) 143/59 140/66  Pulse: 76 67 80 69  Resp:    16  Temp: 98.2 F (36.8 C) 97.7 F (36.5 C) 97.8 F (36.6 C) 98.3 F (36.8 C)  TempSrc: Oral Oral Oral Oral  SpO2: 96% 97% 92% 97%  Weight:  105.5 kg    Height:        Intake/Output Summary (Last 24 hours) at 03/30/2020 1325 Last data filed at 03/30/2020 0947 Gross per 24 hour  Intake 1321.25 ml  Output 600 ml  Net 721.25 ml   Last 3 Weights 03/30/2020 03/29/2020 03/28/2020  Weight (lbs) 232 lb 8 oz 232 lb 3.2 oz 230 lb  Weight (kg) 105.461 kg 105.325 kg 104.327 kg      Telemetry    Normal sinus rhythm- Personally Reviewed  ECG     - Personally Reviewed  Physical Exam   GEN: No acute distress.  Obese Neck: No JVD Cardiac: RRR, no murmurs, rubs, or gallops.  Respiratory: Clear to auscultation bilaterally.  Scattered Rales, wheezing GI: Soft, nontender, non-distended  MS: No edema; No deformity. Neuro:  Nonfocal  Psych: Normal affect    Labs    High Sensitivity Troponin:   Recent Labs  Lab 03/28/20 2206 03/28/20 2342 03/29/20 0151 03/29/20 1003 03/29/20 1424  TROPONINIHS 25* 89* 240* 248* 179*      Chemistry Recent Labs  Lab 03/28/20 2341 03/29/20 1003 03/30/20 0336  NA 141 139 136  K 3.6 3.9 4.3  CL 99 97* 100  CO2 31 34* 30  GLUCOSE 152* 116* 129*  BUN 16 16 23   CREATININE 1.08* 0.93 0.75  CALCIUM 8.9 9.0 8.6*  PROT 6.9  --   --   ALBUMIN 3.3*  --   --   AST 19  --   --   ALT 14  --   --   ALKPHOS 37*  --   --   BILITOT 0.5  --   --   GFRNONAA 56* >60 >60  ANIONGAP 11 8 6      Hematology Recent Labs  Lab 03/28/20 2206 03/29/20 1003 03/30/20 0336  WBC 11.0* 8.3 11.6*  RBC 4.69 4.59 4.35  HGB 13.5 13.1 12.5  HCT 42.4 41.5 38.8  MCV 90.4 90.4 89.2  MCH 28.8 28.5 28.7  MCHC 31.8 31.6 32.2  RDW 15.4 15.5 15.0  PLT 223 216 221    BNP Recent Labs  Lab 03/29/20 1003  BNP 251.3*     DDimer  No results for input(s): DDIMER in the last 168 hours.   Radiology    DG Chest Portable 1 View  Result Date: 03/28/2020 CLINICAL DATA:  Chest pain EXAM: PORTABLE CHEST 1 VIEW COMPARISON:  07/04/2018 FINDINGS: Lungs are clear. No pneumothorax or pleural effusion. Cardiac size is mildly enlarged, unchanged. Pulmonary vascularity is normal. No acute bone abnormality. IMPRESSION: No active disease.  Stable cardiomegaly. Electronically Signed   By: Fidela Salisbury MD   On: 03/28/2020 22:16   ECHOCARDIOGRAM COMPLETE  Result Date: 03/29/2020    ECHOCARDIOGRAM REPORT   Patient Name:   Kristin [name] Lacross Date of Exam: 03/29/2020 Medical Rec #:  [id number]     Height:       62.0 in Accession #:    [id number]    Weight:       232.2 lb Date of Birth:  1950-12-06    BSA:          2.037 m Patient Age:    69 years      BP:           139/64 mmHg Patient Gender: F             HR:           56 bpm. Exam Location:  [facility] Procedure: 2D Echo and Intracardiac Opacification Agent Indications:     Aortic Stenosis 135.0                   Chest Pain R07.9  History:         Patient has prior history of Echocardiogram examinations, most                  recent 07/05/2018.  Sonographer:     [place] Referring Phys:  [id number] Arvil Chaco Diagnosing Phys: Ida Rogue MD IMPRESSIONS  1. Left ventricular ejection fraction, by estimation, is 55 to 60%. The left ventricle has normal function. The left ventricle has no regional wall motion abnormalities. There is mild left ventricular hypertrophy. Left ventricular diastolic parameters are consistent with Grade I diastolic dysfunction (impaired relaxation).  2. Right ventricular systolic function is normal. The right ventricular size is normal. Tricuspid regurgitation signal is inadequate for assessing PA pressure.  3. Left atrial size was mildly dilated.  4. The aortic valve was not well visualized. Moderate to severe aortic valve stenosis. Aortic valve area, by VTI measures 1.11 cm. Aortic valve mean gradient measures 31.0 mmHg. Aortic valve Vmax measures 3.66 m/s. FINDINGS  Left Ventricle: Left ventricular ejection fraction, by estimation, is 55 to 60%. The left ventricle has normal function. The left ventricle has no regional wall motion abnormalities. Definity contrast agent was given IV to delineate the left ventricular  endocardial borders. The left ventricular internal cavity size was normal in size. There is mild left ventricular hypertrophy. Left ventricular diastolic parameters are consistent with Grade I diastolic dysfunction (impaired relaxation). Right Ventricle: The right ventricular size is normal. No increase in right ventricular wall thickness. Right ventricular systolic function is normal. Tricuspid regurgitation signal is inadequate for assessing PA pressure. Left Atrium: Left atrial size was mildly dilated. Right Atrium: Right atrial size was normal in size. Pericardium: There is no evidence of pericardial effusion. Mitral Valve: The mitral valve is normal in  structure. Moderate mitral annular calcification. No evidence of mitral valve regurgitation. No evidence of mitral valve stenosis. Tricuspid Valve: The tricuspid valve is normal in structure. Tricuspid valve regurgitation is not demonstrated. No evidence of  tricuspid stenosis. Aortic Valve: The aortic valve was not well visualized. Aortic valve regurgitation is not visualized. Moderate to severe aortic stenosis is present. Aortic valve mean gradient measures 31.0 mmHg. Aortic valve peak gradient measures 53.6 mmHg. Aortic valve area, by VTI measures 1.11 cm. Pulmonic Valve: The pulmonic valve was normal in structure. Pulmonic valve regurgitation is not visualized. No evidence of pulmonic stenosis. Aorta: The aortic root is normal in size and structure. Venous: The inferior vena cava is normal in size with greater than 50% respiratory variability, suggesting right atrial pressure of 3 mmHg. IAS/Shunts: No atrial level shunt detected by color flow Doppler.  LEFT VENTRICLE PLAX 2D LVIDd:         4.87 cm  Diastology LVIDs:         3.40 cm  LV e' medial:    4.13 cm/s LV PW:         1.22 cm  LV E/e' medial:  23.6 LV IVS:        1.32 cm  LV e' lateral:   7.62 cm/s LVOT diam:     1.90 cm  LV E/e' lateral: 12.8 LV SV:         95 LV SV Index:   47 LVOT Area:     2.84 cm  LEFT ATRIUM           Index LA diam:      4.70 cm 2.31 cm/m LA Vol (A4C): 23.7 ml 11.64 ml/m  AORTIC VALVE AV Area (Vmax):    0.98 cm AV Area (Vmean):   0.99 cm AV Area (VTI):     1.11 cm AV Vmax:           366.00 cm/s AV Vmean:          264.000 cm/s AV VTI:            0.858 m AV Peak Grad:      53.6 mmHg AV Mean Grad:      31.0 mmHg LVOT Vmax:         126.00 cm/s LVOT Vmean:        92.100 cm/s LVOT VTI:          0.335 m LVOT/AV VTI ratio: 0.39  AORTA Ao Root diam: 2.80 cm MITRAL VALVE MV Area (PHT): 4.54 cm     SHUNTS MV Decel Time: 167 msec     Systemic VTI:  0.34 m MV E velocity: 97.50 cm/s   Systemic Diam: 1.90 cm MV A velocity: 105.00 cm/s MV E/A  ratio:  0.93 Ida Rogue MD Electronically signed by Ida Rogue MD Signature Date/Time: 03/29/2020/4:45:18 PM    Final     Cardiac Studies   Echo 1. Left ventricular ejection fraction, by estimation, is 55 to 60%. The  left ventricle has normal function. The left ventricle has no regional  wall motion abnormalities. There is mild left ventricular hypertrophy.  Left ventricular diastolic parameters  are consistent with Grade I diastolic dysfunction (impaired relaxation).  2. Right ventricular systolic function is normal. The right ventricular  size is normal. Tricuspid regurgitation signal is inadequate for assessing  PA pressure.  3. Left atrial size was mildly dilated.  4. The aortic valve was not well visualized. Moderate to severe aortic  valve stenosis. Aortic valve area, by VTI measures 1.11 cm. Aortic valve  mean gradient measures 31.0 mmHg. Aortic valve Vmax measures 3.66 m/s.   Patient Profile     Ms. Westling is a 69 year old woman with morbid obesity,  diastolic CHF, moderate aortic valve stenosis, long history of smoking who continues to smoke on 3 L nasal cannula oxygen at home, COPD, moderate to severe aortic valve stenosis, obstructive sleep apnea, diabetes type 2 presenting with angina  Assessment & Plan    Unstable angina Long smoking history ,  diabetes type 2, morbid obesity, Presenting with central chest pain concerning for angina, EKG with nonspecific ST abnormalities, troponin up to 250 =she is declining stress testing  concern about underlying ischemia -Given very high BMI, stress testing and outpatient cardiac CTA would be less beneficial, BMI 42 -After discussion, she prefers heart catheterization on Monday, November 15 in the morning.  -This will also help in evaluating aortic valve stenosis I have reviewed the risks, indications, and alternatives to cardiac catheterization, possible angioplasty, and stenting with the patient. Risks include but are not  limited to bleeding, infection, vascular injury, stroke, myocardial infection, arrhythmia, kidney injury, radiation-related injury in the case of prolonged fluoroscopy use, emergency cardiac surgery, and death. The patient understands the risks of serious complication is 1-2 in 8416 with diagnostic cardiac cath and 1-2% or less with angioplasty/stenting.  -Aspirin Lipitor beta-blocker ARB --- If intervention is needed, will need to confirm aortic valve stenosis gradient first.  Essential hypertension Poorly controlled blood pressure on arrival running 170 up to 180 On carvedilol 6.25 twice daily Losartan 25 -Numbers now running 606-301 systolic -We will increase losartan up to 50 daily  COPD/smoker Cessation recommended -Needs outpatient follow-up with pulmonary  Aortic valve stenosis,-moderate to severe aortic valve stenosis -Catheterization tomorrow to confirm gradient Mean gradient 31 Estimated valve area 1.1 cm  Diabetes type 2 Hemoglobin A1c 6.0 Morbid obesity Lifestyle modification recommended   Total encounter time more than 25 minutes  Greater than 50% was spent in counseling and coordination of care with the patient   For questions or updates, please contact [facility] Please consult [email or web address] for contact info under        Signed, Ida Rogue, MD  03/30/2020, 1:25 PM

## 2020-03-30 NOTE — H&P (View-Only) (Signed)
Progress Note  Patient Name: Kristin [name] Date of Encounter: 03/30/2020  [facility] Cardiologist: new to [facility]  Subjective   Poor night sleep, reports having restless legs Did not ambulate much yesterday during the daytime Denies significant chest pain, reports her breathing is stable on oxygen  Inpatient Medications    Scheduled Meds: . aspirin EC  81 mg Oral Daily  . atorvastatin  80 mg Oral q1800  . carvedilol  6.25 mg Oral BID WC  . insulin aspart  0-20 Units Subcutaneous TID WC  . insulin aspart  0-5 Units Subcutaneous QHS  . levothyroxine  75 mcg Oral QAC breakfast  . losartan  25 mg Oral Daily  . predniSONE  40 mg Oral Q breakfast   Continuous Infusions: . heparin 1,650 Units/hr (03/30/20 1242)   PRN Meds: acetaminophen, albuterol, alum & mag hydroxide-simeth, ipratropium-albuterol, nitroGLYCERIN, nitroGLYCERIN, ondansetron (ZOFRAN) IV   Vital Signs    Vitals:   03/29/20 1957 03/30/20 0425 03/30/20 0700 03/30/20 1100  BP: (!) 152/62 135/66 (!) 143/59 140/66  Pulse: 76 67 80 69  Resp:    16  Temp: 98.2 F (36.8 C) 97.7 F (36.5 C) 97.8 F (36.6 C) 98.3 F (36.8 C)  TempSrc: Oral Oral Oral Oral  SpO2: 96% 97% 92% 97%  Weight:  105.5 kg    Height:        Intake/Output Summary (Last 24 hours) at 03/30/2020 1325 Last data filed at 03/30/2020 0947 Gross per 24 hour  Intake 1321.25 ml  Output 600 ml  Net 721.25 ml   Last 3 Weights 03/30/2020 03/29/2020 03/28/2020  Weight (lbs) 232 lb 8 oz 232 lb 3.2 oz 230 lb  Weight (kg) 105.461 kg 105.325 kg 104.327 kg      Telemetry    Normal sinus rhythm- Personally Reviewed  ECG     - Personally Reviewed  Physical Exam   GEN: No acute distress.  Obese Neck: No JVD Cardiac: RRR, no murmurs, rubs, or gallops.  Respiratory: Clear to auscultation bilaterally.  Scattered Rales, wheezing GI: Soft, nontender, non-distended  MS: No edema; No deformity. Neuro:  Nonfocal  Psych: Normal affect    Labs    High Sensitivity Troponin:   Recent Labs  Lab 03/28/20 2206 03/28/20 2342 03/29/20 0151 03/29/20 1003 03/29/20 1424  TROPONINIHS 25* 89* 240* 248* 179*      Chemistry Recent Labs  Lab 03/28/20 2341 03/29/20 1003 03/30/20 0336  NA 141 139 136  K 3.6 3.9 4.3  CL 99 97* 100  CO2 31 34* 30  GLUCOSE 152* 116* 129*  BUN 16 16 23   CREATININE 1.08* 0.93 0.75  CALCIUM 8.9 9.0 8.6*  PROT 6.9  --   --   ALBUMIN 3.3*  --   --   AST 19  --   --   ALT 14  --   --   ALKPHOS 37*  --   --   BILITOT 0.5  --   --   GFRNONAA 56* >60 >60  ANIONGAP 11 8 6      Hematology Recent Labs  Lab 03/28/20 2206 03/29/20 1003 03/30/20 0336  WBC 11.0* 8.3 11.6*  RBC 4.69 4.59 4.35  HGB 13.5 13.1 12.5  HCT 42.4 41.5 38.8  MCV 90.4 90.4 89.2  MCH 28.8 28.5 28.7  MCHC 31.8 31.6 32.2  RDW 15.4 15.5 15.0  PLT 223 216 221    BNP Recent Labs  Lab 03/29/20 1003  BNP 251.3*     DDimer  No results for input(s): DDIMER in the last 168 hours.   Radiology    DG Chest Portable 1 View  Result Date: 03/28/2020 CLINICAL DATA:  Chest pain EXAM: PORTABLE CHEST 1 VIEW COMPARISON:  07/04/2018 FINDINGS: Lungs are clear. No pneumothorax or pleural effusion. Cardiac size is mildly enlarged, unchanged. Pulmonary vascularity is normal. No acute bone abnormality. IMPRESSION: No active disease.  Stable cardiomegaly. Electronically Signed   By: Fidela Salisbury MD   On: 03/28/2020 22:16   ECHOCARDIOGRAM COMPLETE  Result Date: 03/29/2020    ECHOCARDIOGRAM REPORT   Patient Name:   YARIANNA VARBLE Hilscher Date of Exam: 03/29/2020 Medical Rec #:  [id number]     Height:       62.0 in Accession #:    [id number]    Weight:       232.2 lb Date of Birth:  [birth year]    BSA:          2.037 m Patient Age:    31 years      BP:           139/64 mmHg Patient Gender: F             HR:           56 bpm. Exam Location:  [facility] Procedure: 2D Echo and Intracardiac Opacification Agent Indications:     Aortic Stenosis 135.0                   Chest Pain R07.9  History:         Patient has prior history of Echocardiogram examinations, most                  recent 07/05/2018.  Sonographer:     [place] Referring Phys:  [id number] Arvil Chaco Diagnosing Phys: Ida Rogue MD IMPRESSIONS  1. Left ventricular ejection fraction, by estimation, is 55 to 60%. The left ventricle has normal function. The left ventricle has no regional wall motion abnormalities. There is mild left ventricular hypertrophy. Left ventricular diastolic parameters are consistent with Grade I diastolic dysfunction (impaired relaxation).  2. Right ventricular systolic function is normal. The right ventricular size is normal. Tricuspid regurgitation signal is inadequate for assessing PA pressure.  3. Left atrial size was mildly dilated.  4. The aortic valve was not well visualized. Moderate to severe aortic valve stenosis. Aortic valve area, by VTI measures 1.11 cm. Aortic valve mean gradient measures 31.0 mmHg. Aortic valve Vmax measures 3.66 m/s. FINDINGS  Left Ventricle: Left ventricular ejection fraction, by estimation, is 55 to 60%. The left ventricle has normal function. The left ventricle has no regional wall motion abnormalities. Definity contrast agent was given IV to delineate the left ventricular  endocardial borders. The left ventricular internal cavity size was normal in size. There is mild left ventricular hypertrophy. Left ventricular diastolic parameters are consistent with Grade I diastolic dysfunction (impaired relaxation). Right Ventricle: The right ventricular size is normal. No increase in right ventricular wall thickness. Right ventricular systolic function is normal. Tricuspid regurgitation signal is inadequate for assessing PA pressure. Left Atrium: Left atrial size was mildly dilated. Right Atrium: Right atrial size was normal in size. Pericardium: There is no evidence of pericardial effusion. Mitral Valve: The mitral valve is normal in  structure. Moderate mitral annular calcification. No evidence of mitral valve regurgitation. No evidence of mitral valve stenosis. Tricuspid Valve: The tricuspid valve is normal in structure. Tricuspid valve regurgitation is not demonstrated. No evidence of  tricuspid stenosis. Aortic Valve: The aortic valve was not well visualized. Aortic valve regurgitation is not visualized. Moderate to severe aortic stenosis is present. Aortic valve mean gradient measures 31.0 mmHg. Aortic valve peak gradient measures 53.6 mmHg. Aortic valve area, by VTI measures 1.11 cm. Pulmonic Valve: The pulmonic valve was normal in structure. Pulmonic valve regurgitation is not visualized. No evidence of pulmonic stenosis. Aorta: The aortic root is normal in size and structure. Venous: The inferior vena cava is normal in size with greater than 50% respiratory variability, suggesting right atrial pressure of 3 mmHg. IAS/Shunts: No atrial level shunt detected by color flow Doppler.  LEFT VENTRICLE PLAX 2D LVIDd:         4.87 cm  Diastology LVIDs:         3.40 cm  LV e' medial:    4.13 cm/s LV PW:         1.22 cm  LV E/e' medial:  23.6 LV IVS:        1.32 cm  LV e' lateral:   7.62 cm/s LVOT diam:     1.90 cm  LV E/e' lateral: 12.8 LV SV:         95 LV SV Index:   47 LVOT Area:     2.84 cm  LEFT ATRIUM           Index LA diam:      4.70 cm 2.31 cm/m LA Vol (A4C): 23.7 ml 11.64 ml/m  AORTIC VALVE AV Area (Vmax):    0.98 cm AV Area (Vmean):   0.99 cm AV Area (VTI):     1.11 cm AV Vmax:           366.00 cm/s AV Vmean:          264.000 cm/s AV VTI:            0.858 m AV Peak Grad:      53.6 mmHg AV Mean Grad:      31.0 mmHg LVOT Vmax:         126.00 cm/s LVOT Vmean:        92.100 cm/s LVOT VTI:          0.335 m LVOT/AV VTI ratio: 0.39  AORTA Ao Root diam: 2.80 cm MITRAL VALVE MV Area (PHT): 4.54 cm     SHUNTS MV Decel Time: 167 msec     Systemic VTI:  0.34 m MV E velocity: 97.50 cm/s   Systemic Diam: 1.90 cm MV A velocity: 105.00 cm/s MV E/A  ratio:  0.93 Ida Rogue MD Electronically signed by Ida Rogue MD Signature Date/Time: 03/29/2020/4:45:18 PM    Final     Cardiac Studies   Echo 1. Left ventricular ejection fraction, by estimation, is 55 to 60%. The  left ventricle has normal function. The left ventricle has no regional  wall motion abnormalities. There is mild left ventricular hypertrophy.  Left ventricular diastolic parameters  are consistent with Grade I diastolic dysfunction (impaired relaxation).  2. Right ventricular systolic function is normal. The right ventricular  size is normal. Tricuspid regurgitation signal is inadequate for assessing  PA pressure.  3. Left atrial size was mildly dilated.  4. The aortic valve was not well visualized. Moderate to severe aortic  valve stenosis. Aortic valve area, by VTI measures 1.11 cm. Aortic valve  mean gradient measures 31.0 mmHg. Aortic valve Vmax measures 3.66 m/s.   Patient Profile     Ms. Sauseda is a 69 year old woman with morbid obesity,  diastolic CHF, moderate aortic valve stenosis, long history of smoking who continues to smoke on 3 L nasal cannula oxygen at home, COPD, moderate to severe aortic valve stenosis, obstructive sleep apnea, diabetes type 2 presenting with angina  Assessment & Plan    Unstable angina Long smoking history ,  diabetes type 2, morbid obesity, Presenting with central chest pain concerning for angina, EKG with nonspecific ST abnormalities, troponin up to 250 =she is declining stress testing  concern about underlying ischemia -Given very high BMI, stress testing and outpatient cardiac CTA would be less beneficial, BMI 42 -After discussion, she prefers heart catheterization on Monday, November 15 in the morning.  -This will also help in evaluating aortic valve stenosis I have reviewed the risks, indications, and alternatives to cardiac catheterization, possible angioplasty, and stenting with the patient. Risks include but are not  limited to bleeding, infection, vascular injury, stroke, myocardial infection, arrhythmia, kidney injury, radiation-related injury in the case of prolonged fluoroscopy use, emergency cardiac surgery, and death. The patient understands the risks of serious complication is 1-2 in 2023 with diagnostic cardiac cath and 1-2% or less with angioplasty/stenting.  -Aspirin Lipitor beta-blocker ARB --- If intervention is needed, will need to confirm aortic valve stenosis gradient first.  Essential hypertension Poorly controlled blood pressure on arrival running 170 up to 180 On carvedilol 6.25 twice daily Losartan 25 -Numbers now running 343-568 systolic -We will increase losartan up to 50 daily  COPD/smoker Cessation recommended -Needs outpatient follow-up with pulmonary  Aortic valve stenosis,-moderate to severe aortic valve stenosis -Catheterization tomorrow to confirm gradient Mean gradient 31 Estimated valve area 1.1 cm  Diabetes type 2 Hemoglobin A1c 6.0 Morbid obesity Lifestyle modification recommended   Total encounter time more than 25 minutes  Greater than 50% was spent in counseling and coordination of care with the patient   For questions or updates, please contact [facility] Please consult [email or web address] for contact info under        Signed, Ida Rogue, MD  03/30/2020, 1:25 PM

## 2020-03-30 NOTE — Progress Notes (Addendum)
[place] for Heparin Indication: chest pain/ACS  Allergies  Allergen Reactions  . Altace [Ramipril] Swelling    Patient Measurements: Height: 5\' 2"  (157.5 cm) Weight: 105.5 kg (232 lb 8 oz) IBW/kg (Calculated) : 50.1 HEPARIN DW (KG): 75.4  Vital Signs: Temp: 97.9 F (36.6 C) (11/14 1605) Temp Source: Oral (11/14 1605) BP: 149/60 (11/14 1605) Pulse Rate: 65 (11/14 1605)  Labs: Recent Labs    03/28/20 2206 03/28/20 2206 03/28/20 2341 03/28/20 2342 03/29/20 0151 03/29/20 1003 03/29/20 1236 03/29/20 1424 03/29/20 1950 03/30/20 0336 03/30/20 1038 03/30/20 1846  HGB 13.5   < >  --   --   --  13.1  --   --   --  12.5  --   --   HCT 42.4  --   --   --   --  41.5  --   --   --  38.8  --   --   PLT 223  --   --   --   --  216  --   --   --  221  --   --   HEPARINUNFRC  --   --   --   --   --  <0.10*   < >  --    < > 0.33 0.22* 0.35  CREATININE  --   --  1.08*  --   --  0.93  --   --   --  0.75  --   --   TROPONINIHS 25*  --   --    < > 240* 248*  --  179*  --   --   --   --    < > = values in this interval not displayed.   Estimated Creatinine Clearance: 77.9 mL/min (by C-G formula based on SCr of 0.75 mg/dL).  Medical History: Past Medical History:  Diagnosis Date  . Anxiety   . Asthma   . CHF (congestive heart failure) ([place])    2018  . COPD (chronic obstructive pulmonary disease) ([place])   . Diabetes mellitus without complication ([place])   . Dyspnea   . GERD (gastroesophageal reflux disease)   . History of hiatal hernia   . History of orthopnea   . Hypothyroidism   . Neuropathy   . Oxygen deficiency    3L/HS  . Pain    BACK/DDD  . Peripheral vascular disease ([place])   . RLS (restless legs syndrome)   . Sleep apnea   . Wheezing    Medications:  Medications Prior to Admission  Medication Sig Dispense Refill Last Dose  . albuterol (PROAIR HFA) 108 (90 Base) MCG/ACT inhaler Inhale 2 puffs into the lungs every 4 (four) hours  as needed for wheezing or shortness of breath.    prn at prn  . aspirin EC 81 MG tablet Take 81 mg by mouth daily.   03/28/2020 at Unknown time  . atorvastatin (LIPITOR) 80 MG tablet Take 80 mg by mouth at bedtime.    Past Week at Unknown time  . carvedilol (COREG) 3.125 MG tablet Take 1 tablet (3.125 mg total) by mouth 2 (two) times daily with a meal. 60 tablet 0 03/28/2020 at 0730  . citalopram (CELEXA) 40 MG tablet Take 40 mg by mouth daily.   03/28/2020 at Unknown time  . diclofenac Sodium (VOLTAREN) 1 % GEL Apply 1 application topically 4 (four) times daily.    03/28/2020 at Unknown time  . esomeprazole ([place])  40 MG capsule Take 40 mg by mouth daily.   03/28/2020 at Unknown time  . fenofibrate 160 MG tablet Take 160 mg by mouth daily.   03/28/2020 at Unknown time  . furosemide (LASIX) 40 MG tablet Take 40 mg by mouth daily.   03/28/2020 at Unknown time  . gabapentin (NEURONTIN) 600 MG tablet Take 600 mg by mouth 2 (two) times daily.    03/28/2020 at Unknown time  . hydrOXYzine (VISTARIL) 50 MG capsule Take 50 mg by mouth every 6 (six) hours as needed for anxiety.   prn at prn  . levothyroxine (SYNTHROID, LEVOTHROID) 75 MCG tablet Take 75 mcg by mouth daily before breakfast.   03/28/2020 at Unknown time  . lisinopril (PRINIVIL,ZESTRIL) 20 MG tablet Take 20 mg by mouth daily.   03/28/2020 at Unknown time  . metFORMIN (GLUCOPHAGE) 500 MG tablet Take 500 mg by mouth 2 (two) times daily.   03/28/2020 at 0730  . MOVANTIK 12.5 MG TABS tablet Take 12.5 mg by mouth daily.    03/28/2020 at Unknown time  . oxyCODONE-acetaminophen (PERCOCET/ROXICET) 5-325 MG tablet Take 1 tablet by mouth 3 (three) times daily.   03/28/2020 at Unknown time  . OXYGEN Inhale 3 L into the lungs at bedtime.    Past Week at Unknown time  . sitaGLIPtin (JANUVIA) 100 MG tablet Take 100 mg by mouth daily.   03/28/2020 at Unknown time  . Tiotropium Bromide Monohydrate (SPIRIVA RESPIMAT) 2.5 MCG/ACT AERS Inhale 2 puffs into the  lungs daily.    03/28/2020 at Unknown time  . ACCU-CHEK GUIDE test strip        Assessment: No anticoagulants PTA per med list.  Heparin started for ACS.   Heparin 4000 units bolus x 1 then infusion at 1050 units/hr  03/29/20  HL@1003  <0.10. RN had moved Heparin IV to different hand so rechecked level. HL@1316  still < 0.10. Heparin 2200 units bolus x 1 then increase infusion to 1300 units/hr 03/30/20 @ 0336 HL 0.33, therapeutic x 1 after rate increase .Will continue infusion at 1500 units/hr  03/30/20 @1038   HL 0.22, subtherapeutic. Heparin bolus of 1100 units and  increased infusion to 1650 units/hr   Goal of Therapy:  Heparin level 0.3-0.7 units/ml Monitor platelets by anticoagulation protocol: Yes   Plan:   03/30/20 @ 1846  HL 0.35, therapeutic x1.  Will continue heparin infusion of 1650 units/hr. Per notes, likely cath tomorrow a.m.11/15  Check HL ~ 6 hours to confirm   CBC per protocol  Dorthy Hustead R Yailyn Strack 03/30/2020,7:35 PM

## 2020-03-31 ENCOUNTER — Encounter
Admission: EM | Disposition: A | Discharge status: Home / Self Care | Payer: Self-pay | Source: Home / Self Care | Attending: Internal Medicine

## 2020-03-31 DIAGNOSIS — I2510 Atherosclerotic heart disease of native coronary artery without angina pectoris: Secondary | ICD-10-CM

## 2020-03-31 DIAGNOSIS — J432 Centrilobular emphysema: Secondary | ICD-10-CM | POA: Diagnosis not present

## 2020-03-31 DIAGNOSIS — I5032 Chronic diastolic (congestive) heart failure: Secondary | ICD-10-CM | POA: Diagnosis not present

## 2020-03-31 DIAGNOSIS — I5033 Acute on chronic diastolic (congestive) heart failure: Secondary | ICD-10-CM

## 2020-03-31 DIAGNOSIS — I350 Nonrheumatic aortic (valve) stenosis: Secondary | ICD-10-CM

## 2020-03-31 DIAGNOSIS — J9611 Chronic respiratory failure with hypoxia: Secondary | ICD-10-CM | POA: Diagnosis not present

## 2020-03-31 DIAGNOSIS — G4733 Obstructive sleep apnea (adult) (pediatric): Secondary | ICD-10-CM

## 2020-03-31 DIAGNOSIS — R072 Precordial pain: Secondary | ICD-10-CM | POA: Diagnosis not present

## 2020-03-31 DIAGNOSIS — I214 Non-ST elevation (NSTEMI) myocardial infarction: Secondary | ICD-10-CM | POA: Diagnosis not present

## 2020-03-31 HISTORY — PX: RIGHT/LEFT HEART CATH AND CORONARY ANGIOGRAPHY: CATH118266

## 2020-03-31 LAB — CBC
HCT: 38 % (ref 36.0–46.0)
Hemoglobin: 11.8 g/dL — ABNORMAL LOW (ref 12.0–15.0)
MCH: 28.1 pg (ref 26.0–34.0)
MCHC: 31.1 g/dL (ref 30.0–36.0)
MCV: 90.5 fL (ref 80.0–100.0)
Platelets: 217 10*3/uL (ref 150–400)
RBC: 4.2 MIL/uL (ref 3.87–5.11)
RDW: 15 % (ref 11.5–15.5)
WBC: 10.5 10*3/uL (ref 4.0–10.5)
nRBC: 0 % (ref 0.0–0.2)

## 2020-03-31 LAB — MAGNESIUM: Magnesium: 1.9 mg/dL (ref 1.7–2.4)

## 2020-03-31 LAB — GLUCOSE, CAPILLARY
Glucose-Capillary: 99 mg/dL (ref 70–99)
Glucose-Capillary: 103 mg/dL — ABNORMAL HIGH (ref 70–99)
Glucose-Capillary: 245 mg/dL — ABNORMAL HIGH (ref 70–99)
Glucose-Capillary: 101 mg/dL — ABNORMAL HIGH (ref 70–99)

## 2020-03-31 LAB — BASIC METABOLIC PANEL
Anion gap: 9 (ref 5–15)
BUN: 29 mg/dL — ABNORMAL HIGH (ref 8–23)
CO2: 30 mmol/L (ref 22–32)
Calcium: 8.8 mg/dL — ABNORMAL LOW (ref 8.9–10.3)
Chloride: 99 mmol/L (ref 98–111)
Creatinine, Ser: 0.85 mg/dL (ref 0.44–1.00)
GFR, Estimated: >60 mL/min (ref >60)
Glucose, Bld: 136 mg/dL — ABNORMAL HIGH (ref 70–99)
Potassium: 4.3 mmol/L (ref 3.5–5.1)
Sodium: 138 mmol/L (ref 135–145)

## 2020-03-31 LAB — HEPARIN LEVEL (UNFRACTIONATED)
Heparin Unfractionated: 0.29 IU/mL — ABNORMAL LOW (ref 0.30–0.70)
Heparin Unfractionated: 0.48 IU/mL (ref 0.30–0.70)

## 2020-03-31 SURGERY — RIGHT/LEFT HEART CATH AND CORONARY ANGIOGRAPHY
Anesthesia: Moderate Sedation

## 2020-03-31 MED ORDER — VERAPAMIL HCL 2.5 MG/ML IV SOLN
INTRAVENOUS | Status: AC
  Filled 2020-03-31: qty 2

## 2020-03-31 MED ORDER — SODIUM CHLORIDE 0.9 % IV SOLN: INTRAVENOUS | Status: DC

## 2020-03-31 MED ORDER — SODIUM CHLORIDE 0.9% FLUSH: 3.0000 mL | INTRAVENOUS | Status: DC | PRN

## 2020-03-31 MED ORDER — SODIUM CHLORIDE 0.9 % IV SOLN: 250.0000 mL | INTRAVENOUS | Status: DC | PRN

## 2020-03-31 MED ORDER — FENOFIBRATE 160 MG PO TABS
160.0000 mg | ORAL_TABLET | Freq: Every day | ORAL | Status: DC
  Administered 2020-04-01 – 2020-04-02 (×2): 160 mg via ORAL
  Filled 2020-03-31 (×3): qty 1

## 2020-03-31 MED ORDER — MIDAZOLAM HCL 2 MG/2ML IJ SOLN
INTRAMUSCULAR | Status: DC | PRN
  Administered 2020-03-31 (×2): 1 mg via INTRAVENOUS

## 2020-03-31 MED ORDER — HEPARIN SODIUM (PORCINE) 1000 UNIT/ML IJ SOLN
INTRAMUSCULAR | Status: DC | PRN
  Administered 2020-03-31: 5000 [IU] via INTRAVENOUS

## 2020-03-31 MED ORDER — MIDAZOLAM HCL 2 MG/2ML IJ SOLN
INTRAMUSCULAR | Status: AC
  Filled 2020-03-31: qty 2

## 2020-03-31 MED ORDER — ENOXAPARIN SODIUM 60 MG/0.6ML ~~LOC~~ SOLN
0.5000 mg/kg | SUBCUTANEOUS | Status: DC
  Administered 2020-04-01 – 2020-04-02 (×2): 52.5 mg via SUBCUTANEOUS
  Filled 2020-03-31 (×2): qty 0.6

## 2020-03-31 MED ORDER — ASPIRIN 81 MG PO CHEW
81.0000 mg | CHEWABLE_TABLET | ORAL | Status: AC
  Administered 2020-03-31: 81 mg via ORAL
  Filled 2020-03-31: qty 1

## 2020-03-31 MED ORDER — FENTANYL CITRATE (PF) 100 MCG/2ML IJ SOLN
INTRAMUSCULAR | Status: AC
  Filled 2020-03-31: qty 2

## 2020-03-31 MED ORDER — FUROSEMIDE 40 MG PO TABS: 40.0000 mg | ORAL_TABLET | Freq: Every day | ORAL | Status: DC

## 2020-03-31 MED ORDER — CITALOPRAM HYDROBROMIDE 20 MG PO TABS
40.0000 mg | ORAL_TABLET | Freq: Every day | ORAL | Status: DC
  Administered 2020-04-01 – 2020-04-02 (×2): 40 mg via ORAL
  Filled 2020-03-31 (×2): qty 2

## 2020-03-31 MED ORDER — GABAPENTIN 600 MG PO TABS
600.0000 mg | ORAL_TABLET | Freq: Two times a day (BID) | ORAL | Status: DC
  Administered 2020-03-31 – 2020-04-02 (×4): 600 mg via ORAL
  Filled 2020-03-31 (×4): qty 1

## 2020-03-31 MED ORDER — IOHEXOL 300 MG/ML  SOLN
INTRAMUSCULAR | Status: DC | PRN
  Administered 2020-03-31: 70 mL

## 2020-03-31 MED ORDER — SODIUM CHLORIDE 0.9% FLUSH
3.0000 mL | Freq: Two times a day (BID) | INTRAVENOUS | Status: DC
  Administered 2020-04-01 – 2020-04-02 (×3): 3 mL via INTRAVENOUS

## 2020-03-31 MED ORDER — FUROSEMIDE 10 MG/ML IJ SOLN
20.0000 mg | Freq: Two times a day (BID) | INTRAMUSCULAR | Status: DC
  Administered 2020-03-31 – 2020-04-01 (×2): 20 mg via INTRAVENOUS
  Filled 2020-03-31 (×2): qty 2

## 2020-03-31 MED ORDER — HEPARIN (PORCINE) IN NACL 1000-0.9 UT/500ML-% IV SOLN
INTRAVENOUS | Status: DC | PRN
  Administered 2020-03-31: 500 mL

## 2020-03-31 MED ORDER — TIOTROPIUM BROMIDE MONOHYDRATE 18 MCG IN CAPS
1.0000 | ORAL_CAPSULE | Freq: Every day | RESPIRATORY_TRACT | Status: DC
  Administered 2020-04-01 – 2020-04-02 (×2): 18 ug via RESPIRATORY_TRACT
  Filled 2020-03-31: qty 5

## 2020-03-31 MED ORDER — ENOXAPARIN SODIUM 40 MG/0.4ML ~~LOC~~ SOLN: 40.0000 mg | SUBCUTANEOUS | Status: DC

## 2020-03-31 MED ORDER — FENTANYL CITRATE (PF) 100 MCG/2ML IJ SOLN
INTRAMUSCULAR | Status: DC | PRN
  Administered 2020-03-31 (×2): 25 ug via INTRAVENOUS

## 2020-03-31 MED ORDER — CARVEDILOL 12.5 MG PO TABS
12.5000 mg | ORAL_TABLET | Freq: Two times a day (BID) | ORAL | Status: DC
  Administered 2020-04-01 (×2): 12.5 mg via ORAL
  Filled 2020-03-31 (×3): qty 1

## 2020-03-31 MED ORDER — SODIUM CHLORIDE 0.9% FLUSH
3.0000 mL | Freq: Two times a day (BID) | INTRAVENOUS | Status: DC
  Administered 2020-03-31 – 2020-04-01 (×3): 3 mL via INTRAVENOUS

## 2020-03-31 MED ORDER — VERAPAMIL HCL 2.5 MG/ML IV SOLN
INTRAVENOUS | Status: DC | PRN
  Administered 2020-03-31: 2.5 mg via INTRA_ARTERIAL

## 2020-03-31 MED ORDER — LIDOCAINE HCL (PF) 1 % IJ SOLN
INTRAMUSCULAR | Status: AC
  Filled 2020-03-31: qty 30

## 2020-03-31 MED ORDER — PANTOPRAZOLE SODIUM 40 MG PO TBEC
40.0000 mg | DELAYED_RELEASE_TABLET | Freq: Every day | ORAL | Status: DC
  Administered 2020-03-31 – 2020-04-02 (×3): 40 mg via ORAL
  Filled 2020-03-31 (×3): qty 1

## 2020-03-31 MED ORDER — HEPARIN SODIUM (PORCINE) 1000 UNIT/ML IJ SOLN
INTRAMUSCULAR | Status: AC
  Filled 2020-03-31: qty 1

## 2020-03-31 MED ORDER — LIDOCAINE HCL (PF) 1 % IJ SOLN
INTRAMUSCULAR | Status: DC | PRN
  Administered 2020-03-31: 5 mL

## 2020-03-31 MED ORDER — HYDROXYZINE PAMOATE 50 MG PO CAPS
50.0000 mg | ORAL_CAPSULE | Freq: Four times a day (QID) | ORAL | Status: DC | PRN
  Filled 2020-03-31: qty 1

## 2020-03-31 MED ORDER — HEPARIN (PORCINE) IN NACL 1000-0.9 UT/500ML-% IV SOLN
INTRAVENOUS | Status: AC
  Filled 2020-03-31: qty 1000

## 2020-03-31 SURGICAL SUPPLY — 10 items
CATH BALLN WEDGE 5F 110CM (CATHETERS) ×1 IMPLANT
CATH INFINITI 5FR JK (CATHETERS) ×1 IMPLANT
DEVICE RAD TR BAND REGULAR (VASCULAR PRODUCTS) ×1 IMPLANT
GLIDESHEATH SLEND SS 6F .021 (SHEATH) ×1 IMPLANT
GUIDEWIRE INQWIRE 1.5J.035X260 (WIRE) IMPLANT
INQWIRE 1.5J .035X260CM (WIRE) ×2
KIT MANI 3VAL PERCEP (MISCELLANEOUS) ×2 IMPLANT
PACK CARDIAC CATH (CUSTOM PROCEDURE TRAY) ×2 IMPLANT
PANNUS RETENTION SYSTEM 2 PAD (MISCELLANEOUS) ×1 IMPLANT
SHEATH GLIDE SLENDER 4/5FR (SHEATH) ×1 IMPLANT

## 2020-03-31 NOTE — Progress Notes (Signed)
[place] for Heparin Indication: chest pain/ACS  Allergies  Allergen Reactions  . Altace [Ramipril] Swelling    Patient Measurements: Height: 5\' 2"  (157.5 cm) Weight: 105.5 kg (232 lb 8 oz) IBW/kg (Calculated) : 50.1 HEPARIN DW (KG): 75.4  Vital Signs: Temp: 98.5 F (36.9 C) (11/14 2019) Temp Source: Oral (11/14 2019) BP: 130/61 (11/14 2019) Pulse Rate: 64 (11/14 2019)  Labs: Recent Labs    03/28/20 2206 03/28/20 2206 03/28/20 2341 03/28/20 2342 03/29/20 0151 03/29/20 1003 03/29/20 1236 03/29/20 1424 03/29/20 1950 03/30/20 0336 03/30/20 0336 03/30/20 1038 03/30/20 1846 03/31/20 0041  HGB 13.5   < >  --   --   --  13.1  --   --   --  12.5  --   --   --   --   HCT 42.4  --   --   --   --  41.5  --   --   --  38.8  --   --   --   --   PLT 223  --   --   --   --  216  --   --   --  221  --   --   --   --   HEPARINUNFRC  --   --   --   --   --  <0.10*   < >  --    < > 0.33   < > 0.22* 0.35 0.29*  CREATININE  --   --  1.08*  --   --  0.93  --   --   --  0.75  --   --   --   --   TROPONINIHS 25*  --   --    < > 240* 248*  --  179*  --   --   --   --   --   --    < > = values in this interval not displayed.   Estimated Creatinine Clearance: 77.9 mL/min (by C-G formula based on SCr of 0.75 mg/dL).  Medical History: Past Medical History:  Diagnosis Date  . Anxiety   . Asthma   . CHF (congestive heart failure) ([place])    2018  . COPD (chronic obstructive pulmonary disease) ([place])   . Diabetes mellitus without complication ([place])   . Dyspnea   . GERD (gastroesophageal reflux disease)   . History of hiatal hernia   . History of orthopnea   . Hypothyroidism   . Neuropathy   . Oxygen deficiency    3L/HS  . Pain    BACK/DDD  . Peripheral vascular disease ([place])   . RLS (restless legs syndrome)   . Sleep apnea   . Wheezing    Medications:  Medications Prior to Admission  Medication Sig Dispense Refill Last Dose  . albuterol  (PROAIR HFA) 108 (90 Base) MCG/ACT inhaler Inhale 2 puffs into the lungs every 4 (four) hours as needed for wheezing or shortness of breath.    prn at prn  . aspirin EC 81 MG tablet Take 81 mg by mouth daily.   03/28/2020 at Unknown time  . atorvastatin (LIPITOR) 80 MG tablet Take 80 mg by mouth at bedtime.    Past Week at Unknown time  . carvedilol (COREG) 3.125 MG tablet Take 1 tablet (3.125 mg total) by mouth 2 (two) times daily with a meal. 60 tablet 0 03/28/2020 at 0730  . citalopram (CELEXA) 40 MG tablet  Take 40 mg by mouth daily.   03/28/2020 at Unknown time  . diclofenac Sodium (VOLTAREN) 1 % GEL Apply 1 application topically 4 (four) times daily.    03/28/2020 at Unknown time  . esomeprazole (NEXIUM) 40 MG capsule Take 40 mg by mouth daily.   03/28/2020 at Unknown time  . fenofibrate 160 MG tablet Take 160 mg by mouth daily.   03/28/2020 at Unknown time  . furosemide (LASIX) 40 MG tablet Take 40 mg by mouth daily.   03/28/2020 at Unknown time  . gabapentin (NEURONTIN) 600 MG tablet Take 600 mg by mouth 2 (two) times daily.    03/28/2020 at Unknown time  . hydrOXYzine (VISTARIL) 50 MG capsule Take 50 mg by mouth every 6 (six) hours as needed for anxiety.   prn at prn  . levothyroxine (SYNTHROID, LEVOTHROID) 75 MCG tablet Take 75 mcg by mouth daily before breakfast.   03/28/2020 at Unknown time  . lisinopril (PRINIVIL,ZESTRIL) 20 MG tablet Take 20 mg by mouth daily.   03/28/2020 at Unknown time  . metFORMIN (GLUCOPHAGE) 500 MG tablet Take 500 mg by mouth 2 (two) times daily.   03/28/2020 at 0730  . MOVANTIK 12.5 MG TABS tablet Take 12.5 mg by mouth daily.    03/28/2020 at Unknown time  . oxyCODONE-acetaminophen (PERCOCET/ROXICET) 5-325 MG tablet Take 1 tablet by mouth 3 (three) times daily.   03/28/2020 at Unknown time  . OXYGEN Inhale 3 L into the lungs at bedtime.    Past Week at Unknown time  . sitaGLIPtin (JANUVIA) 100 MG tablet Take 100 mg by mouth daily.   03/28/2020 at Unknown time  .  Tiotropium Bromide Monohydrate (SPIRIVA RESPIMAT) 2.5 MCG/ACT AERS Inhale 2 puffs into the lungs daily.    03/28/2020 at Unknown time  . ACCU-CHEK GUIDE test strip        Assessment: No anticoagulants PTA per med list.  Heparin started for ACS.   Heparin 4000 units bolus x 1 then infusion at 1050 units/hr  03/29/20  HL@1003  <0.10. RN had moved Heparin IV to different hand so rechecked level. HL@1316  still < 0.10. Heparin 2200 units bolus x 1 then increase infusion to 1300 units/hr 03/30/20 @ 0336 HL 0.33, therapeutic x 1 after rate increase .Will continue infusion at 1500 units/hr  03/30/20 @1038   HL 0.22, subtherapeutic. Heparin bolus of 1100 units and  increased infusion to 1650 units/hr   Goal of Therapy:  Heparin level 0.3-0.7 units/ml Monitor platelets by anticoagulation protocol: Yes   Plan:   03/31/20 @ 0041  HL 0.29, SUBtherapeutic. Will increase heparin infusion to 1850 units/hr.  Per notes, likely cath tomorrow a.m.11/15  Check HL ~ 6 hours after rate increase  CBC per protocol  Hart Robinsons A 03/31/2020,3:08 AM

## 2020-03-31 NOTE — Progress Notes (Signed)
ANTICOAGULATION CONSULT NOTE  Pharmacy Consult for Heparin Indication: chest pain/ACS  Patient Measurements: Height: 5\' 2"  (157.5 cm) Weight: 107.2 kg (236 lb 6.4 oz) IBW/kg (Calculated) : 50.1 HEPARIN DW (KG): 75.4  Labs: Recent Labs    03/28/20 2206 03/29/20 0151 03/29/20 1003 03/29/20 1236 03/29/20 1424 03/29/20 1950 03/30/20 0336 03/30/20 1038 03/30/20 1846 03/31/20 0041 03/31/20 0327 03/31/20 0947  HGB   < >  --  13.1   < >  --   --  12.5  --   --   --  11.8*  --   HCT   < >  --  41.5  --   --   --  38.8  --   --   --  38.0  --   PLT   < >  --  216  --   --   --  221  --   --   --  217  --   HEPARINUNFRC  --   --  <0.10*   < >  --    < > 0.33   < > 0.35 0.29*  --  0.48  CREATININE   < >  --  0.93  --   --   --  0.75  --   --   --  0.85  --   TROPONINIHS  --  240* 248*  --  179*  --   --   --   --   --   --   --    < > = values in this interval not displayed.   Estimated Creatinine Clearance: 73.9 mL/min (by C-G formula based on SCr of 0.85 mg/dL).  Medical History: Past Medical History:  Diagnosis Date  . Anxiety   . Asthma   . CHF (congestive heart failure) ([place])    2018  . COPD (chronic obstructive pulmonary disease) ([place])   . Diabetes mellitus without complication ([place])   . Dyspnea   . GERD (gastroesophageal reflux disease)   . History of hiatal hernia   . History of orthopnea   . Hypothyroidism   . Neuropathy   . Oxygen deficiency    3L/HS  . Pain    BACK/DDD  . Peripheral vascular disease ([place])   . RLS (restless legs syndrome)   . Sleep apnea   . Wheezing    Assessment: Patient is a 69 y/o F with medical history as above who was admitted 11/13 with unstable angina. Pharmacy has been consulted to initiate heparin infusion. Plan is for cardiac catheterization 11/15.   CBC slightly worse since admission. Platelets stable overall.   Goal of Therapy:  Heparin level 0.3-0.7 units/ml Monitor platelets by anticoagulation protocol: Yes   Plan:    11/15 at 0947 HL 0.48, therapeutic x 1. Will maintain heparin infusion at 1850 units/hr  Re-check confirmatory HL at 1600  CBC per protocol  Benita Gutter 03/31/2020,10:50 AM

## 2020-03-31 NOTE — Progress Notes (Signed)
Progress Note  Patient Name: Kristin [name] Date of Encounter: 03/31/2020  Primary Cardiologist: [facility], Dr. Rockey Situ  Subjective   Seen immediately after her right and left heart catheterization today 11/15.    No chest pain or shortness of breath. TR band still in place.  She denies any tenderness of her right wrist. Reports some abdominal distention and that her belly feels tight.  Discussed catheterization findings and that she will be evaluated for TAVR in [place].  Answered any questions regarding TAVR work-up.  Notified that I have reached out to our TAVR team.  Inpatient Medications    Scheduled Meds: . [MAR Hold] aspirin EC  81 mg Oral Daily  . [MAR Hold] atorvastatin  80 mg Oral q1800  . carvedilol  12.5 mg Oral BID WC  . [MAR Hold] citalopram  40 mg Oral Daily  . [START ON 04/01/2020] enoxaparin (LOVENOX) injection  40 mg Subcutaneous Q24H  . [MAR Hold] fenofibrate  160 mg Oral Daily  . furosemide  20 mg Intravenous BID  . [MAR Hold] gabapentin  600 mg Oral BID  . [MAR Hold] insulin aspart  0-20 Units Subcutaneous TID WC  . [MAR Hold] insulin aspart  0-5 Units Subcutaneous QHS  . [MAR Hold] levothyroxine  75 mcg Oral QAC breakfast  . [MAR Hold] losartan  25 mg Oral Daily  . [MAR Hold] melatonin  5 mg Oral QHS  . [MAR Hold] pantoprazole  40 mg Oral Daily  . [MAR Hold] polyethylene glycol  17 g Oral Daily  . [MAR Hold] predniSONE  40 mg Oral Q breakfast  . [MAR Hold] sodium chloride flush  3 mL Intravenous Q12H  . sodium chloride flush  3 mL Intravenous Q12H  . [MAR Hold] tiotropium  1 capsule Inhalation Daily   Continuous Infusions: . sodium chloride     PRN Meds: sodium chloride, [MAR Hold] acetaminophen, [MAR Hold] albuterol, [MAR Hold] alum & mag hydroxide-simeth, [MAR Hold] hydrOXYzine, [MAR Hold] ipratropium-albuterol, [MAR Hold] nitroGLYCERIN, [MAR Hold] ondansetron (ZOFRAN) IV, sodium chloride flush   Vital Signs    Vitals:   03/31/20 1120  03/31/20 1418 03/31/20 1547 03/31/20 1600  BP: (!) 141/61 (!) 147/61 (!) 150/103 137/69  Pulse: 61 68 76 75  Resp: 16 18 (!) 26 18  Temp: 97.7 F (36.5 C) 98.1 F (36.7 C)    TempSrc: Oral Oral    SpO2: 98% 97% 94% 94%  Weight:  107.2 kg    Height:  5\' 2"  (1.575 m)      Intake/Output Summary (Last 24 hours) at 03/31/2020 1616 Last data filed at 03/31/2020 0619 Gross per 24 hour  Intake 478 ml  Output 1201 ml  Net -723 ml   Last 3 Weights 03/31/2020 03/31/2020 03/30/2020  Weight (lbs) 236 lb 5.3 oz 236 lb 6.4 oz 232 lb 8 oz  Weight (kg) 107.2 kg 107.23 kg 105.461 kg      Telemetry    NSR (evaluated by monitor at NSR 1)- Personally Reviewed  ECG    No new tracings- Personally Reviewed  Physical Exam   GEN: No acute distress.  Evaluated s/p right and left heart catheterization 11/15. Neck: No JVD Cardiac: RRR, 3/6 systolic murmur best appreciated RUSB.  No murmurs, rubs, or gallops.  Respiratory:  Coarse breath sounds bilaterally, bibasilar crackles. GI:  Slightly distended but not TTP MS: No edema; No deformity. Neuro:  Nonfocal  Psych: Normal affect   Labs    High Sensitivity Troponin:   Recent  Labs  Lab 03/28/20 2206 03/28/20 2342 03/29/20 0151 03/29/20 1003 03/29/20 1424  TROPONINIHS 25* 89* 240* 248* 179*      Cardiac EnzymesNo results for input(s): TROPONINI in the last 168 hours. No results for input(s): TROPIPOC in the last 168 hours.   Chemistry Recent Labs  Lab 03/28/20 2341 03/28/20 2341 03/29/20 1003 03/30/20 0336 03/31/20 0327  NA 141   < > 139 136 138  K 3.6   < > 3.9 4.3 4.3  CL 99   < > 97* 100 99  CO2 31   < > 34* 30 30  GLUCOSE 152*   < > 116* 129* 136*  BUN 16   < > 16 23 29*  CREATININE 1.08*   < > 0.93 0.75 0.85  CALCIUM 8.9   < > 9.0 8.6* 8.8*  PROT 6.9  --   --   --   --   ALBUMIN 3.3*  --   --   --   --   AST 19  --   --   --   --   ALT 14  --   --   --   --   ALKPHOS 37*  --   --   --   --   BILITOT 0.5  --   --    --   --   GFRNONAA 56*   < > >60 >60 >60  ANIONGAP 11   < > 8 6 9    < > = values in this interval not displayed.     Hematology Recent Labs  Lab 03/29/20 1003 03/30/20 0336 03/31/20 0327  WBC 8.3 11.6* 10.5  RBC 4.59 4.35 4.20  HGB 13.1 12.5 11.8*  HCT 41.5 38.8 38.0  MCV 90.4 89.2 90.5  MCH 28.5 28.7 28.1  MCHC 31.6 32.2 31.1  RDW 15.5 15.0 15.0  PLT 216 221 217    BNP Recent Labs  Lab 03/29/20 1003  BNP 251.3*     DDimer No results for input(s): DDIMER in the last 168 hours.   Radiology    CARDIAC CATHETERIZATION  Result Date: 03/31/2020  Prox RCA lesion is 30% stenosed.  Mid RCA lesion is 40% stenosed.  RPDA lesion is 30% stenosed.  Prox Cx lesion is 60% stenosed.  Mid LAD lesion is 20% stenosed.  Mid Cx to Dist Cx lesion is 20% stenosed.  1.  Mild to moderate nonobstructive coronary artery disease.  Worst stenosis is 60% in the proximal left circumflex.  No evidence of obstructive disease.  Coronary arteries are overall moderately calcified. 2.  Right heart catheterization showed moderately elevated left-sided filling pressures, moderate pulmonary hypertension and moderately reduced cardiac output. 3.  Severe aortic stenosis with mean gradient of 31 mmHg and calculated valve area of 0.7 cm. Recommendations: The patient is significantly volume overloaded.  I switched furosemide to intravenous 20 mg twice daily.  I increased carvedilol for better blood pressure control. Recommend medical therapy for nonobstructive coronary artery disease. Recommend outpatient TAVR evaluation.    Cardiac Studies   [facility] 03/31/20  Prox RCA lesion is 30% stenosed.  Mid RCA lesion is 40% stenosed.  RPDA lesion is 30% stenosed.  Prox Cx lesion is 60% stenosed.  Mid LAD lesion is 20% stenosed.  Mid Cx to Dist Cx lesion is 20% stenosed. 1.  Mild to moderate nonobstructive coronary artery disease.  Worst stenosis is 60% in the proximal left circumflex.  No evidence of  obstructive disease.  Coronary arteries are  overall moderately calcified. 2.  Right heart catheterization showed moderately elevated left-sided filling pressures, moderate pulmonary hypertension and moderately reduced cardiac output. 3.  Severe aortic stenosis with mean gradient of 31 mmHg and calculated valve area of 0.7 cm. Recommendations: The patient is significantly volume overloaded.  I switched furosemide to intravenous 20 mg twice daily.  I increased carvedilol for better blood pressure control. Recommend medical therapy for nonobstructive coronary artery disease. Recommend outpatient TAVR evaluation. Coronary Diagrams  Diagnostic Dominance: Right    Echo 03/29/20 1. Left ventricular ejection fraction, by estimation, is 55 to 60%. The  left ventricle has normal function. The left ventricle has no regional  wall motion abnormalities. There is mild left ventricular hypertrophy.  Left ventricular diastolic parameters  are consistent with Grade I diastolic dysfunction (impaired relaxation).  2. Right ventricular systolic function is normal. The right ventricular  size is normal. Tricuspid regurgitation signal is inadequate for assessing  PA pressure.  3. Left atrial size was mildly dilated.  4. The aortic valve was not well visualized. Moderate to severe aortic  valve stenosis. Aortic valve area, by VTI measures 1.11 cm. Aortic valve  mean gradient measures 31.0 mmHg. Aortic valve Vmax measures 3.66 m/s.   Patient Profile     69 y.o. female  with history of HFpEF (11/13 EF 55-60%), severe calcification of the aortic valve with severe stenosis and recommendation for TAVR evaluation, hypertension, chronic respiratory failure on 3 L nasal cannula home oxygen, COPD, history of current tobacco use, OSA, hypothyroidism, DM2, anxiety, and GERD, and who is being seen today for the evaluation of chest pain s/p recent [facility] ([place] as above showing nonobstructive CAD and volume overload, as  well as recommendation for TAVR evaluation as an outpatient.  Assessment & Plan    NSTEMI  Moderate nonobstructive coronary artery disease by 03/31/2020 catheterization --No current CP s/p R/LHC.   --HS Tn peaked at 248 on 03/29/20. --Echo 03/2020 with EF 55-60%, no wall motion abnl, mild LVH, G1DD, AS. --Cath as copied above showed mild to moderate nonobstructive coronary artery disease with 4 stenosis 60% in the proximal left circumflex artery.  No evidence of obstructive disease.   --Overall, coronary arteries were noted to be moderately calcified. --R radial TR band still in place on exam. --Recommend continued medical management and aggressive risk factor modification. Continue ASA, Coreg 12.5mg  BID, ARB, as needed sublingual nitro as needed for chest pain. Continue PTA atorvastatin 80mg  daily with LDL goal below 70.  Recommend smoking cessation.  Severe aortic stenosis --S/p R/LHC as above  Catheterization showed severe aortic stenosis with mean gradient of 31 mmHg and calculated valve area of 0.7 cm. --Outpatient TAVR evaluation recommended. --Discussed recommendation with patient today and with her understanding. --Message sent to TAVR team PA to notify structural heart team of need for outpatient TAVR. --Recommend continued diuresis, as well as heart rate and BP control. --We will need to schedule follow-up with TAVR team at or before discharge.  Acute on chronic HFpEF --Denies current shortness of breath.  On 3 L nasal cannula oxygen at baseline.  Shortness of breath multifactorial with history of current tobacco use, COPD, OSA, OHS, HFpEF with current volume overload, and severe aortic stenosis as above. --S/p recent [facility] as above that showed moderately elevated LVEDP, moderate pulmonary hypertension, and moderately reduced cardiac output.  Also noted with severe aortic stenosis with recommendation for outpatient TAVR as above. --Volume up on exam. IV diuresis increased at time  of cath. --Continue to monitor  I's/O's. Net -3cc for last 24h.  --Daily standing weights. Wt 105.5kg  107.2kg. --Daily BMET.  Renal function and electrolytes currently stable. Monitor and replete electrolytes as needed. --Continue IV furosemide 20 mg twice daily. Will need transitioned back to oral diuresis before discharge.  Of note, PTA Lasix 40 mg daily noted. --Continue increased dose carvedilol 12.5 mg twice daily for better blood pressure control. --Continue losartan 25 mg daily. --CHF education. --Follow-up will need scheduled in the office before discharge.  HTN --Strict BP control recommended with goal BP 130/80 or lower. --Given comorbid DM2, started losartan per GDMT.  --Continue increased Coreg, losartan, IV diuresis.  DM2 --A1C 6.0. --Ordered ARB earlier in admission given comorbid HTN. --SSI, per IM. --Follow-up with PCP as OP.  Hypothyroidism --TSH wnl. --Per IM, PCP.  For questions or updates, please contact [place] Please consult [email or web address] for contact info under        Signed, Arvil Chaco, PA-C  03/31/2020, 4:16 PM

## 2020-03-31 NOTE — Progress Notes (Signed)
PROGRESS NOTE    Kristin [name]  WEX:[id number] DOB: [birth year] DOA: 03/28/2020 PCP: Lorelee Market, MD   Brief Narrative:  Patient is a 69 year old female with past medical history of diabetes, hypertension, chronic respiratory failure on 3 L oxygen via nasal cannula at home secondary to underlying COPD, OSA, hypothyroidism, GERD, diastolic CHF presents to emergency department with retrosternal chest pain associated with shortness of breath and nausea.  ED course: Patient was pain-free on arrival.  She was tachycardic with O2 sats in the 90s on 3 L of oxygen, first troponin 25 trended trended up to 89.  Lipase: 40.  EKG: No ischemic changes.  Chest x-ray negative.  Cardiology consulted.  [facility] consulted for admission for chest pain rule out ACS.  Assessment & Plan:  NSTEMI: -Risk factors including hypertension, hyperlipidemia, diabetes mellitus, ongoing tobacco abuse, obesity -Troponin trending up from 25-->29-->240.  Reviewed EKG: Nonspecific ST changes in inferior anterolateral leads -Continue heparin gtt. -Continue aspirin, beta-blocker and statin, nitro as needed for chest pain. -Cardiology consulted-plan is for cath today -Echo shows ejection fraction of 55 to 60% with grade 1 diastolic dysfunction with moderate to severe aortic stenosis.  Chronic hypoxemic respiratory failure secondary to underlying COPD and ongoing tobacco abuse: At baseline. -Patient is requiring 3 L which is her baseline. -DuoNebs as needed.  Continue home inhalers.  Chest x-ray negative.  COVID-19 negative.  Continue prednisone 40 mg daily for 5 days.     Chronic diastolic CHF: Patient appears euvolemic on exam. -Echo as above.  BNP: 251 -Continue aspirin, statin, Coreg.   -Strict INO's and daily weight.  Monitor electrolytes closely  Moderate to severe aortic stenosis: -Reviewed echo. -Management as per cardiology  Hypertension: Stable -Continue Coreg and losartan.  Continue to monitor  blood pressure closely.  Type 2 diabetes mellitus: Well controlled.  A1c 6.0% -Continue sliding scale insulin.  Hypothyroidism: TSH: WNL.  Continue levothyroxine  Tobacco abuse: Counseled about cessation  GERD: Continue PPI  Neuropathy/restless leg syndrome: Continue gabapentin  Morbid obesity with BMI of 42 -Diet modification/exercise and weight loss recommended  Leukocytosis: Likely secondary to prednisone -No signs of infection.  Chest x-ray negative.  COVID-19 negative.  No indication of antibiotics at this time. -Resolved  AKI: Resolved  OSA: Nighttime CPAP  DVT prophylaxis: Heparin GTT Code Status: Full code Family Communication:  None present at bedside.  Plan of care discussed with patient in length and she verbalized understanding and agreed with it. Disposition Plan: Home  Consultants:   Cardiology  Procedures:   Cardiac cath  Antimicrobials:   None  Status is: Inpatient  Dispo: The patient is from: Home              Anticipated d/c is to: Home              Anticipated d/c date is: 04/01/2020              Patient currently not medically stable for the discharge   Subjective: Patient seen and examined.  Sleeping comfortably on the bed.  Tells me that she is doing well denies chest pain, shortness of breath, palpitation, leg swelling.  Requested something to help with her restless leg syndrome.  Waiting for her procedure.    Objective: Vitals:   03/31/20 0729 03/31/20 1044 03/31/20 1120 03/31/20 1418  BP: (!) 141/66 (!) 149/67 (!) 141/61 (!) 147/61  Pulse: 65 67 61 68  Resp: 16 18 16 18   Temp: 98.2 F (36.8 C) 97.8 F (  36.6 C) 97.7 F (36.5 C) 98.1 F (36.7 C)  TempSrc: Oral Oral Oral Oral  SpO2: 100% 97% 98% 97%  Weight:    107.2 kg  Height:    5\' 2"  (1.575 m)    Intake/Output Summary (Last 24 hours) at 03/31/2020 1444 Last data filed at 03/31/2020 9242 Gross per 24 hour  Intake 478 ml  Output 1201 ml  Net -723 ml   Filed Weights    03/30/20 0425 03/31/20 0344 03/31/20 1418  Weight: 105.5 kg 107.2 kg 107.2 kg    Examination:  General exam: Appears calm and comfortable, on 3 L of oxygen via nasal cannula, obese, sleepy but arousable and communicating well, alert and oriented x3. Respiratory system: Clear to auscultation. Respiratory effort normal. Cardiovascular system: S1 & S2 heard, RRR. No JVD, murmurs, rubs, gallops or clicks. No pedal edema. Gastrointestinal system: Abdomen is nondistended, soft and nontender. No organomegaly or masses felt. Normal bowel sounds heard. Central nervous system: Alert and oriented. No focal neurological deficits. Extremities: Symmetric 5 x 5 power. Skin: No rashes, lesions or ulcers. Psychiatry: Judgement and insight appear normal. Mood & affect appropriate..   Data Reviewed: I have personally reviewed following labs and imaging studies  CBC: Recent Labs  Lab 03/28/20 2206 03/29/20 1003 03/30/20 0336 03/31/20 0327  WBC 11.0* 8.3 11.6* 10.5  HGB 13.5 13.1 12.5 11.8*  HCT 42.4 41.5 38.8 38.0  MCV 90.4 90.4 89.2 90.5  PLT 223 216 221 683   Basic Metabolic Panel: Recent Labs  Lab 03/28/20 2341 03/29/20 1003 03/30/20 0336 03/31/20 0327  NA 141 139 136 138  K 3.6 3.9 4.3 4.3  CL 99 97* 100 99  CO2 31 34* 30 30  GLUCOSE 152* 116* 129* 136*  BUN 16 16 23  29*  CREATININE 1.08* 0.93 0.75 0.85  CALCIUM 8.9 9.0 8.6* 8.8*  MG  --  1.7  --  1.9   GFR: Estimated Creatinine Clearance: 73.9 mL/min (by C-G formula based on SCr of 0.85 mg/dL). Liver Function Tests: Recent Labs  Lab 03/28/20 2341  AST 19  ALT 14  ALKPHOS 37*  BILITOT 0.5  PROT 6.9  ALBUMIN 3.3*   Recent Labs  Lab 03/28/20 2341  LIPASE 40   No results for input(s): AMMONIA in the last 168 hours. Coagulation Profile: No results for input(s): INR, PROTIME in the last 168 hours. Cardiac Enzymes: No results for input(s): CKTOTAL, CKMB, CKMBINDEX, TROPONINI in the last 168 hours. BNP (last 3  results) No results for input(s): PROBNP in the last 8760 hours. HbA1C: Recent Labs    03/28/20 2206  HGBA1C 6.0*   CBG: Recent Labs  Lab 03/30/20 1125 03/30/20 1648 03/30/20 2041 03/31/20 0731 03/31/20 1121  GLUCAP 113* 176* 144* 99 103*   Lipid Profile: Recent Labs    03/29/20 1003  CHOL 193  HDL 52  LDLCALC 113*  TRIG 140  CHOLHDL 3.7   Thyroid Function Tests: Recent Labs    03/29/20 1003  TSH 0.623   Anemia Panel: No results for input(s): VITAMINB12, FOLATE, FERRITIN, TIBC, IRON, RETICCTPCT in the last 72 hours. Sepsis Labs: No results for input(s): PROCALCITON, LATICACIDVEN in the last 168 hours.  Recent Results (from the past 240 hour(s))  Respiratory Panel by RT PCR (Flu A&B, Covid) - Nasopharyngeal Swab     Status: None   Collection Time: 03/28/20 10:06 PM   Specimen: Nasopharyngeal Swab  Result Value Ref Range Status   SARS Coronavirus 2 by RT PCR NEGATIVE NEGATIVE  Final    Comment: (NOTE) SARS-CoV-2 target nucleic acids are NOT DETECTED.  The SARS-CoV-2 RNA is generally detectable in upper respiratoy specimens during the acute phase of infection. The lowest concentration of SARS-CoV-2 viral copies this assay can detect is 131 copies/mL. A negative result does not preclude SARS-Cov-2 infection and should not be used as the sole basis for treatment or other patient management decisions. A negative result may occur with  improper specimen collection/handling, submission of specimen other than nasopharyngeal swab, presence of viral mutation(s) within the areas targeted by this assay, and inadequate number of viral copies (<131 copies/mL). A negative result must be combined with clinical observations, patient history, and epidemiological information. The expected result is Negative.  Fact Sheet for Patients:  PinkCheek.be  Fact Sheet for Healthcare Providers:  GravelBags.it  This test is  no t yet approved or cleared by the [place] FDA and  has been authorized for detection and/or diagnosis of SARS-CoV-2 by FDA under an Emergency Use Authorization (EUA). This EUA will remain  in effect (meaning this test can be used) for the duration of the COVID-19 declaration under Section 564(b)(1) of the Act, 21 U.S.C. section 360bbb-3(b)(1), unless the authorization is terminated or revoked sooner.     Influenza A by PCR NEGATIVE NEGATIVE Final   Influenza B by PCR NEGATIVE NEGATIVE Final    Comment: (NOTE) The Xpert Xpress SARS-CoV-2/FLU/RSV assay is intended as an aid in  the diagnosis of influenza from Nasopharyngeal swab specimens and  should not be used as a sole basis for treatment. Nasal washings and  aspirates are unacceptable for Xpert Xpress SARS-CoV-2/FLU/RSV  testing.  Fact Sheet for Patients: PinkCheek.be  Fact Sheet for Healthcare Providers: GravelBags.it  This test is not yet approved or cleared by the [place] FDA and  has been authorized for detection and/or diagnosis of SARS-CoV-2 by  FDA under an Emergency Use Authorization (EUA). This EUA will remain  in effect (meaning this test can be used) for the duration of the  Covid-19 declaration under Section 564(b)(1) of the Act, 21  U.S.C. section 360bbb-3(b)(1), unless the authorization is  terminated or revoked. Performed at [facility], [address], [place], [place] [address]       Radiology Studies: ECHOCARDIOGRAM COMPLETE  Result Date: 03/29/2020    ECHOCARDIOGRAM REPORT   Patient Name:   MYRTHA TONKOVICH Holladay Date of Exam: 03/29/2020 Medical Rec #:  [id number]     Height:       62.0 in Accession #:    [id number]    Weight:       232.2 lb Date of Birth:  1950-05-26    BSA:          2.037 m Patient Age:    17 years      BP:           139/64 mmHg Patient Gender: F             HR:           56 bpm. Exam Location:  [facility] Procedure: 2D  Echo and Intracardiac Opacification Agent Indications:     Aortic Stenosis 135.0                  Chest Pain R07.9  History:         Patient has prior history of Echocardiogram examinations, most                  recent 07/05/2018.  Sonographer:  [place] Referring Phys:  [id number] [place] Diagnosing Phys: Ida Rogue MD IMPRESSIONS  1. Left ventricular ejection fraction, by estimation, is 55 to 60%. The left ventricle has normal function. The left ventricle has no regional wall motion abnormalities. There is mild left ventricular hypertrophy. Left ventricular diastolic parameters are consistent with Grade I diastolic dysfunction (impaired relaxation).  2. Right ventricular systolic function is normal. The right ventricular size is normal. Tricuspid regurgitation signal is inadequate for assessing PA pressure.  3. Left atrial size was mildly dilated.  4. The aortic valve was not well visualized. Moderate to severe aortic valve stenosis. Aortic valve area, by VTI measures 1.11 cm. Aortic valve mean gradient measures 31.0 mmHg. Aortic valve Vmax measures 3.66 m/s. FINDINGS  Left Ventricle: Left ventricular ejection fraction, by estimation, is 55 to 60%. The left ventricle has normal function. The left ventricle has no regional wall motion abnormalities. Definity contrast agent was given IV to delineate the left ventricular  endocardial borders. The left ventricular internal cavity size was normal in size. There is mild left ventricular hypertrophy. Left ventricular diastolic parameters are consistent with Grade I diastolic dysfunction (impaired relaxation). Right Ventricle: The right ventricular size is normal. No increase in right ventricular wall thickness. Right ventricular systolic function is normal. Tricuspid regurgitation signal is inadequate for assessing PA pressure. Left Atrium: Left atrial size was mildly dilated. Right Atrium: Right atrial size was normal in size. Pericardium:  There is no evidence of pericardial effusion. Mitral Valve: The mitral valve is normal in structure. Moderate mitral annular calcification. No evidence of mitral valve regurgitation. No evidence of mitral valve stenosis. Tricuspid Valve: The tricuspid valve is normal in structure. Tricuspid valve regurgitation is not demonstrated. No evidence of tricuspid stenosis. Aortic Valve: The aortic valve was not well visualized. Aortic valve regurgitation is not visualized. Moderate to severe aortic stenosis is present. Aortic valve mean gradient measures 31.0 mmHg. Aortic valve peak gradient measures 53.6 mmHg. Aortic valve area, by VTI measures 1.11 cm. Pulmonic Valve: The pulmonic valve was normal in structure. Pulmonic valve regurgitation is not visualized. No evidence of pulmonic stenosis. Aorta: The aortic root is normal in size and structure. Venous: The inferior vena cava is normal in size with greater than 50% respiratory variability, suggesting right atrial pressure of 3 mmHg. IAS/Shunts: No atrial level shunt detected by color flow Doppler.  LEFT VENTRICLE PLAX 2D LVIDd:         4.87 cm  Diastology LVIDs:         3.40 cm  LV e' medial:    4.13 cm/s LV PW:         1.22 cm  LV E/e' medial:  23.6 LV IVS:        1.32 cm  LV e' lateral:   7.62 cm/s LVOT diam:     1.90 cm  LV E/e' lateral: 12.8 LV SV:         95 LV SV Index:   47 LVOT Area:     2.84 cm  LEFT ATRIUM           Index LA diam:      4.70 cm 2.31 cm/m LA Vol (A4C): 23.7 ml 11.64 ml/m  AORTIC VALVE AV Area (Vmax):    0.98 cm AV Area (Vmean):   0.99 cm AV Area (VTI):     1.11 cm AV Vmax:           366.00 cm/s AV Vmean:  264.000 cm/s AV VTI:            0.858 m AV Peak Grad:      53.6 mmHg AV Mean Grad:      31.0 mmHg LVOT Vmax:         126.00 cm/s LVOT Vmean:        92.100 cm/s LVOT VTI:          0.335 m LVOT/AV VTI ratio: 0.39  AORTA Ao Root diam: 2.80 cm MITRAL VALVE MV Area (PHT): 4.54 cm     SHUNTS MV Decel Time: 167 msec     Systemic VTI:   0.34 m MV E velocity: 97.50 cm/s   Systemic Diam: 1.90 cm MV A velocity: 105.00 cm/s MV E/A ratio:  0.93 Ida Rogue MD Electronically signed by Ida Rogue MD Signature Date/Time: 03/29/2020/4:45:18 PM    Final     Scheduled Meds: . aspirin EC  81 mg Oral Daily  . atorvastatin  80 mg Oral q1800  . carvedilol  6.25 mg Oral BID WC  . citalopram  40 mg Oral Daily  . fenofibrate  160 mg Oral Daily  . furosemide  40 mg Oral Daily  . gabapentin  600 mg Oral BID  . insulin aspart  0-20 Units Subcutaneous TID WC  . insulin aspart  0-5 Units Subcutaneous QHS  . levothyroxine  75 mcg Oral QAC breakfast  . losartan  25 mg Oral Daily  . melatonin  5 mg Oral QHS  . pantoprazole  40 mg Oral Daily  . polyethylene glycol  17 g Oral Daily  . predniSONE  40 mg Oral Q breakfast  . sodium chloride flush  3 mL Intravenous Q12H  . tiotropium  1 capsule Inhalation Daily   Continuous Infusions: . sodium chloride    . sodium chloride 10 mL/hr at 03/31/20 0533  . heparin 1,850 Units/hr (03/31/20 0316)     LOS: 2 days   Time spent: 35 minutes   Stefen Juba Loann Quill, MD [facility]  If 7PM-7AM, please contact night-coverage [email or web address] 03/31/2020, 2:44 PM

## 2020-03-31 NOTE — Interval H&P Note (Signed)
Cath Lab Visit (complete for each Cath Lab visit)  Clinical Evaluation Leading to the Procedure:   ACS: Yes.    Non-ACS:  n/a     History and Physical Interval Note:  03/31/2020 2:52 PM  [name]  has presented today for surgery, with the diagnosis of Non-ST elevation myocardial infarction, moderate aortic stenosis, heart failure with preserved ejection fraction.  The various methods of treatment have been discussed with the patient and family. After consideration of risks, benefits and other options for treatment, the patient has consented to  Procedure(s): RIGHT/LEFT HEART CATH AND CORONARY ANGIOGRAPHY (N/A) as a surgical intervention.  The patient's history has been reviewed, patient examined, no change in status, stable for surgery.  I have reviewed the patient's chart and labs.  Questions were answered to the patient's satisfaction.     Kristin [name]

## 2020-03-31 NOTE — Progress Notes (Signed)
Patient had good night received melatonin sleep aide. She has been NPO since midnight.

## 2020-04-01 ENCOUNTER — Inpatient Hospital Stay: Payer: Medicare HMO

## 2020-04-01 ENCOUNTER — Encounter: Payer: Self-pay | Admitting: Cardiovascular Disease

## 2020-04-01 DIAGNOSIS — I248 Other forms of acute ischemic heart disease: Secondary | ICD-10-CM | POA: Diagnosis not present

## 2020-04-01 DIAGNOSIS — R072 Precordial pain: Secondary | ICD-10-CM | POA: Diagnosis not present

## 2020-04-01 DIAGNOSIS — J9611 Chronic respiratory failure with hypoxia: Secondary | ICD-10-CM | POA: Diagnosis not present

## 2020-04-01 DIAGNOSIS — I5033 Acute on chronic diastolic (congestive) heart failure: Secondary | ICD-10-CM

## 2020-04-01 DIAGNOSIS — J432 Centrilobular emphysema: Secondary | ICD-10-CM | POA: Diagnosis not present

## 2020-04-01 DIAGNOSIS — I350 Nonrheumatic aortic (valve) stenosis: Secondary | ICD-10-CM | POA: Diagnosis not present

## 2020-04-01 DIAGNOSIS — I5032 Chronic diastolic (congestive) heart failure: Secondary | ICD-10-CM | POA: Diagnosis not present

## 2020-04-01 LAB — GLUCOSE, CAPILLARY
Glucose-Capillary: 88 mg/dL (ref 70–99)
Glucose-Capillary: 218 mg/dL — ABNORMAL HIGH (ref 70–99)
Glucose-Capillary: 269 mg/dL — ABNORMAL HIGH (ref 70–99)
Glucose-Capillary: 200 mg/dL — ABNORMAL HIGH (ref 70–99)

## 2020-04-01 LAB — BASIC METABOLIC PANEL
Anion gap: 10 (ref 5–15)
BUN: 26 mg/dL — ABNORMAL HIGH (ref 8–23)
CO2: 33 mmol/L — ABNORMAL HIGH (ref 22–32)
Calcium: 8.9 mg/dL (ref 8.9–10.3)
Chloride: 95 mmol/L — ABNORMAL LOW (ref 98–111)
Creatinine, Ser: 1.01 mg/dL — ABNORMAL HIGH (ref 0.44–1.00)
GFR, Estimated: >60 mL/min (ref >60)
Glucose, Bld: 144 mg/dL — ABNORMAL HIGH (ref 70–99)
Potassium: 4.6 mmol/L (ref 3.5–5.1)
Sodium: 138 mmol/L (ref 135–145)

## 2020-04-01 LAB — BRAIN NATRIURETIC PEPTIDE: B Natriuretic Peptide: 80 pg/mL (ref 0.0–100.0)

## 2020-04-01 LAB — CBC
HCT: 44.2 % (ref 36.0–46.0)
Hemoglobin: 13.9 g/dL (ref 12.0–15.0)
MCH: 29 pg (ref 26.0–34.0)
MCHC: 31.4 g/dL (ref 30.0–36.0)
MCV: 92.3 fL (ref 80.0–100.0)
Platelets: 179 10*3/uL (ref 150–400)
RBC: 4.79 MIL/uL (ref 3.87–5.11)
RDW: 15.4 % (ref 11.5–15.5)
WBC: 8.9 10*3/uL (ref 4.0–10.5)
nRBC: 0 % (ref 0.0–0.2)

## 2020-04-01 LAB — MAGNESIUM: Magnesium: 2 mg/dL (ref 1.7–2.4)

## 2020-04-01 MED ORDER — FUROSEMIDE 10 MG/ML IJ SOLN
40.0000 mg | Freq: Two times a day (BID) | INTRAMUSCULAR | Status: DC
  Administered 2020-04-01 – 2020-04-02 (×2): 40 mg via INTRAVENOUS
  Filled 2020-04-01 (×2): qty 4

## 2020-04-01 NOTE — Progress Notes (Signed)
Progress Note  Patient Name: Kristin [name] Date of Encounter: 04/01/2020  [facility] Cardiologist: Ida Rogue, MD   Subjective   Patient feels better today with less epigastric discomfort, abdominal distention, and leg swelling.  She denies chest pain and shortness of breath.  Inpatient Medications    Scheduled Meds: . aspirin EC  81 mg Oral Daily  . atorvastatin  80 mg Oral q1800  . carvedilol  12.5 mg Oral BID WC  . citalopram  40 mg Oral Daily  . enoxaparin (LOVENOX) injection  0.5 mg/kg Subcutaneous Q24H  . fenofibrate  160 mg Oral Daily  . furosemide  20 mg Intravenous BID  . gabapentin  600 mg Oral BID  . insulin aspart  0-20 Units Subcutaneous TID WC  . insulin aspart  0-5 Units Subcutaneous QHS  . levothyroxine  75 mcg Oral QAC breakfast  . losartan  25 mg Oral Daily  . melatonin  5 mg Oral QHS  . pantoprazole  40 mg Oral Daily  . polyethylene glycol  17 g Oral Daily  . predniSONE  40 mg Oral Q breakfast  . sodium chloride flush  3 mL Intravenous Q12H  . sodium chloride flush  3 mL Intravenous Q12H  . tiotropium  1 capsule Inhalation Daily   Continuous Infusions: . sodium chloride     PRN Meds: sodium chloride, acetaminophen, albuterol, alum & mag hydroxide-simeth, hydrOXYzine, ipratropium-albuterol, nitroGLYCERIN, ondansetron (ZOFRAN) IV, sodium chloride flush   Vital Signs    Vitals:   03/31/20 2036 04/01/20 0516 04/01/20 0810 04/01/20 1211  BP: (!) 136/46 (!) 127/45 (!) 146/66 131/82  Pulse: 68 67 64 73  Resp: 16 20 15 17   Temp: 98 F (36.7 C) 97.9 F (36.6 C) 98 F (36.7 C) 98.3 F (36.8 C)  TempSrc: Oral Oral Oral Oral  SpO2: 97% 97% 97% 94%  Weight:  106.2 kg    Height:        Intake/Output Summary (Last 24 hours) at 04/01/2020 1307 Last data filed at 04/01/2020 0945 Gross per 24 hour  Intake 360 ml  Output --  Net 360 ml   Last 3 Weights 04/01/2020 03/31/2020 03/31/2020  Weight (lbs) 234 lb 2.1 oz 236 lb 5.3 oz 236 lb 6.4 oz   Weight (kg) 106.2 kg 107.2 kg 107.23 kg      Telemetry    Sinus rhythm with PVCs. - Personally Reviewed  ECG    No new tracing.  Physical Exam   GEN: No acute distress.   Neck: No obvious JVD, though body habitus limits evaluation. Cardiac: RRR with 2/6 systolic murmur.  No rubs or gallops.  Right radial/brachial catheterization sites are covered with clean dressings and without hematoma. Respiratory:  Mildly diminished breath sounds throughout without wheezes or crackles. GI:  Obese but soft and nontender. MS:  Trace pretibial edema bilaterally. Neuro:  Nonfocal  Psych: Normal affect   Labs    High Sensitivity Troponin:   Recent Labs  Lab 03/28/20 2206 03/28/20 2342 03/29/20 0151 03/29/20 1003 03/29/20 1424  TROPONINIHS 25* 89* 240* 248* 179*      Chemistry Recent Labs  Lab 03/28/20 2341 03/29/20 1003 03/30/20 0336 03/31/20 0327 04/01/20 0906  NA 141   < > 136 138 138  K 3.6   < > 4.3 4.3 4.6  CL 99   < > 100 99 95*  CO2 31   < > 30 30 33*  GLUCOSE 152*   < > 129* 136* 144*  BUN  16   < > 23 29* 26*  CREATININE 1.08*   < > 0.75 0.85 1.01*  CALCIUM 8.9   < > 8.6* 8.8* 8.9  PROT 6.9  --   --   --   --   ALBUMIN 3.3*  --   --   --   --   AST 19  --   --   --   --   ALT 14  --   --   --   --   ALKPHOS 37*  --   --   --   --   BILITOT 0.5  --   --   --   --   GFRNONAA 56*   < > >60 >60 >60  ANIONGAP 11   < > 6 9 10    < > = values in this interval not displayed.     Hematology Recent Labs  Lab 03/30/20 0336 03/31/20 0327 04/01/20 0906  WBC 11.6* 10.5 8.9  RBC 4.35 4.20 4.79  HGB 12.5 11.8* 13.9  HCT 38.8 38.0 44.2  MCV 89.2 90.5 92.3  MCH 28.7 28.1 29.0  MCHC 32.2 31.1 31.4  RDW 15.0 15.0 15.4  PLT 221 217 179    BNP Recent Labs  Lab 03/29/20 1003 04/01/20 0906  BNP 251.3* 80.0     DDimer No results for input(s): DDIMER in the last 168 hours.   Radiology    CARDIAC CATHETERIZATION  Result Date: 03/31/2020  Prox RCA lesion is  30% stenosed.  Mid RCA lesion is 40% stenosed.  RPDA lesion is 30% stenosed.  Prox Cx lesion is 60% stenosed.  Mid LAD lesion is 20% stenosed.  Mid Cx to Dist Cx lesion is 20% stenosed.  1.  Mild to moderate nonobstructive coronary artery disease.  Worst stenosis is 60% in the proximal left circumflex.  No evidence of obstructive disease.  Coronary arteries are overall moderately calcified. 2.  Right heart catheterization showed moderately elevated left-sided filling pressures, moderate pulmonary hypertension and moderately reduced cardiac output. 3.  Severe aortic stenosis with mean gradient of 31 mmHg and calculated valve area of 0.7 cm. Recommendations: The patient is significantly volume overloaded.  I switched furosemide to intravenous 20 mg twice daily.  I increased carvedilol for better blood pressure control. Recommend medical therapy for nonobstructive coronary artery disease. Recommend outpatient TAVR evaluation.   US Carotid Bilateral  Result Date: 04/01/2020 CLINICAL DATA:  69 year old female with preoperative study EXAM: BILATERAL CAROTID DUPLEX ULTRASOUND TECHNIQUE: Pearline Cables scale imaging, color Doppler and duplex ultrasound were performed of bilateral carotid and vertebral arteries in the neck. COMPARISON:  None. FINDINGS: Criteria: Quantification of carotid stenosis is based on velocity parameters that correlate the residual internal carotid diameter with NASCET-based stenosis levels, using the diameter of the distal internal carotid lumen as the denominator for stenosis measurement. The following velocity measurements were obtained: RIGHT ICA:  Systolic 81 cm/sec, Diastolic 19 cm/sec CCA:  70 cm/sec SYSTOLIC ICA/CCA RATIO:  1.2 ECA:  52 cm/sec LEFT ICA:  Systolic 427 cm/sec, Diastolic 23 cm/sec CCA:  91 cm/sec SYSTOLIC ICA/CCA RATIO:  1.1 ECA:  73 cm/sec Right Brachial SBP: Not acquired Left Brachial SBP: Not acquired RIGHT CAROTID ARTERY: No significant calcifications of the right common  carotid artery. Intermediate waveform maintained. Heterogeneous and partially calcified plaque at the right carotid bifurcation. No significant lumen shadowing. Low resistance waveform of the right ICA. No significant tortuosity. RIGHT VERTEBRAL ARTERY: Antegrade flow with low resistance waveform. LEFT CAROTID ARTERY: No  significant calcifications of the left common carotid artery. Intermediate waveform maintained. Heterogeneous and partially calcified plaque at the left carotid bifurcation without significant lumen shadowing. Low resistance waveform of the left ICA. No significant tortuosity. LEFT VERTEBRAL ARTERY:  Antegrade flow with low resistance waveform. IMPRESSION: Color duplex indicates minimal heterogeneous and calcified plaque, with no hemodynamically significant stenosis by duplex criteria in the extracranial cerebrovascular circulation. Signed, Dulcy Fanny. Dellia Nims, RPVI [facility] Radiology Electronically Signed   By: Corrie Mckusick D.O.   On: 04/01/2020 10:59    Cardiac Studies   R/LHC (03/31/20): 1.  Mild to moderate nonobstructive coronary artery disease.  Worst stenosis is 60% in the proximal left circumflex.  No evidence of obstructive disease.  Coronary arteries are overall moderately calcified. 2.  Right heart catheterization showed moderately elevated left-sided filling pressures, moderate pulmonary hypertension and moderately reduced cardiac output. 3.  Severe aortic stenosis with mean gradient of 31 mmHg and calculated valve area of 0.7 cm.  TTE (03/29/20): 1. Left ventricular ejection fraction, by estimation, is 55 to 60%. The  left ventricle has normal function. The left ventricle has no regional  wall motion abnormalities. There is mild left ventricular hypertrophy.  Left ventricular diastolic parameters  are consistent with Grade I diastolic dysfunction (impaired relaxation).  2. Right ventricular systolic function is  normal. The right ventricular  size is normal. Tricuspid regurgitation signal is inadequate for assessing  PA pressure.  3. Left atrial size was mildly dilated.  4. The aortic valve was not well visualized. Moderate to severe aortic  valve stenosis. Aortic valve area, by VTI measures 1.11 cm. Aortic valve  mean gradient measures 31.0 mmHg. Aortic valve Vmax measures 3.66 m/s.   Patient Profile     69 y.o. female woman with history of chronic HFpEF, severe aortic stenosis, hypertension, COPD with chronic respiratory failure on home oxygen, obstructive sleep apnea, hypothyroidism, diabetes mellitus, anxiety, and GERD, admitted with chest pain and elevated troponin.  Assessment & Plan    Demand ischemia and nonobstructive coronary artery disease: Patient admitted with acute onset of chest pain and noted to have mild troponin elevation, peaking at 248.  Cardiac catheterization yesterday showed mild to moderate, nonobstructive coronary artery disease.  Findings are consistent with demand ischemia in the setting of acute on chronic HFpEF complicated by severe aortic stenosis.  Continue gentle diuresis.  Medical therapy and risk factor modification to prevent progression of coronary artery disease.  This will include low-dose aspirin and atorvastatin 80 mg daily.  Acute on chronic HFpEF: Likely driven by severe aortic stenosis.  Patient reports symptomatic improvement with gentle diuresis.  I's and O's have not been recorded.  Weight is essentially unchanged during this hospitalization.  Catheterization yesterday showed moderately elevated right and left heart filling pressures.  Increase furosemide to 40 mg IV twice daily.  Hopefully, the patient can be transition to oral diuretic therapy tomorrow.  Continue current doses of carvedilol and losartan for blood pressure control.  Severe aortic stenosis: Gradients only moderately elevated, though catheterization yesterday showed aortic valve  area of 0.7 cm.  In the setting of low Fick cardiac output by right heart catheterization, the patient likely has severe paradoxical low flow low gradient aortic stenosis.  Carotid Dopplers completed at the request of the TAVR team.  Patient is scheduled for TAVR evaluation with Dr. Angelena Form later this week as an outpatient.  For questions or updates, please contact [place] Please consult [email or web address] for contact info  under [facility] Cardiology.     Signed, Nelva Bush, MD  04/01/2020, 1:07 PM

## 2020-04-01 NOTE — Progress Notes (Addendum)
PROGRESS NOTE    Gunhild A Glatt  YIR:[id number] DOB: 08/20/1950 DOA: 03/28/2020 PCP: Lorelee Market, MD   Brief Narrative:  Patient is a 69 year old female with past medical history of diabetes, hypertension, chronic respiratory failure on 3 L oxygen via nasal cannula at home secondary to underlying COPD, OSA, hypothyroidism, GERD, diastolic CHF presents to emergency department with retrosternal chest pain associated with shortness of breath and nausea.  ED course: Patient was pain-free on arrival.  She was tachycardic with O2 sats in the 90s on 3 L of oxygen, first troponin 25 trended trended up to 89.  Lipase: 40.  EKG: No ischemic changes.  Chest x-ray negative.  Cardiology consulted.  [facility] hospitalist consulted for admission for chest pain rule out ACS.  Assessment & Plan:  NSTEMI: -Risk factors including hypertension, hyperlipidemia, diabetes mellitus, ongoing tobacco abuse, obesity -Troponin trending up from 25-->29-->240.  -Echo shows ejection fraction of 55 to 60% with grade 1 diastolic dysfunction with moderate to severe aortic stenosis. -Status post R/LHC: On 03/31/2020 which showed mild to moderate nonobstructive coronary artery disease, coronary arteries are overall moderately calcified.  Right heart cath showed a moderately elevated left-sided peeling pressure and moderate pulmonary hypertension and moderately reduced cardiac output.  Severe aortic stenosis.-cardiology recommended to continue medical management and aggressive risk factor modification and started on furosemide 20 mg IV twice daily and increased dose of Coreg. -Continue aspirin, beta-blocker and statin, nitro as needed for chest pain. -DC heparin gtt-now on prophylaxis Lovenox -Appreciate cardiology help  Chronic hypoxemic respiratory failure secondary to underlying COPD and ongoing tobacco abuse: At baseline. -Patient is requiring 3 L which is her baseline. -DuoNebs as needed.  Continue home inhalers.   Chest x-ray negative.  COVID-19 negative.  Continue prednisone 40 mg daily for 5 days.     Acute on chronic diastolic CHF: Patient appears euvolemic on exam. -Echo as above.  BNP: 251 -Continue aspirin, statin, losartan.  Patient-started on Lasix 40 mg IV twice daily by cardiology  and increased dose of Coreg.  Continue same. -Strict INO's and daily weight.  Monitor electrolytes closely  severe aortic stenosis: -Reviewed echo and catheterization result -Cardiology recommended outpatient TAVR evaluation. -Bilateral carotid ultrasound ordered by cardiology- is pending -Continue diuresis  Hypertension: Stable -Continue losartan and Coreg 12.5 mg twice daily.  Continue to monitor blood pressure closely.  Type 2 diabetes mellitus: Well controlled.  A1c 6.0% -Continue sliding scale insulin.  Hypothyroidism: TSH: WNL.  Continue levothyroxine  Tobacco abuse: Counseled about cessation  GERD: Continue PPI  Neuropathy/restless leg syndrome: Continue gabapentin  Morbid obesity with BMI of 42 -Diet modification/exercise and weight loss recommended  Leukocytosis: Likely secondary to prednisone -No signs of infection.  Chest x-ray negative.  COVID-19 negative.  No indication of antibiotics at this time. -Resolved  AKI: Resolved  OSA: Nighttime CPAP  DVT prophylaxis: Lovenox Code Status: Full code Family Communication:  None present at bedside.  Plan of care discussed with patient in length and she verbalized understanding and agreed with it. Disposition Plan: Home  Consultants:   Cardiology  Procedures:   Cardiac cath  Antimicrobials:   None  Status is: Inpatient  Dispo: The patient is from: Home              Anticipated d/c is to: Home              Anticipated d/c date is: 04/02/2020              Patient currently not medically  stable for the discharge   Subjective: Patient seen and examined.  Tells me that she is doing fine.  No chest pain, worsening shortness of  breath, leg pain or swelling, orthopnea, PND, nausea or vomiting, abdominal pain.  Objective: Vitals:   03/31/20 1848 03/31/20 2036 04/01/20 0516 04/01/20 0810  BP: (!) 146/60 (!) 136/46 (!) 127/45 (!) 146/66  Pulse: 70 68 67 64  Resp: 16 16 20 15   Temp: 97.8 F (36.6 C) 98 F (36.7 C) 97.9 F (36.6 C) 98 F (36.7 C)  TempSrc: Oral Oral Oral Oral  SpO2: 99% 97% 97% 97%  Weight:   106.2 kg   Height:        Intake/Output Summary (Last 24 hours) at 04/01/2020 1015 Last data filed at 04/01/2020 0945 Gross per 24 hour  Intake 360 ml  Output --  Net 360 ml   Filed Weights   03/31/20 0344 03/31/20 1418 04/01/20 0516  Weight: 107.2 kg 107.2 kg 106.2 kg    Examination:  General exam: Appears calm and comfortable, on 3 L of oxygen via nasal cannula, communicating well, obese Respiratory system: Clear to auscultation. Respiratory effort normal. Cardiovascular system: S1 & S2 heard, RRR. No JVD, murmurs, rubs, gallops or clicks. No pedal edema. Gastrointestinal system: Abdomen is obese, soft and nontender. No organomegaly or masses felt. Normal bowel sounds heard. Central nervous system: Alert and oriented. No focal neurological deficits. Extremities: Symmetric 5 x 5 power. Skin: No rashes, lesions or ulcers. Psychiatry: Judgement and insight appear normal. Mood & affect appropriate.   Data Reviewed: I have personally reviewed following labs and imaging studies  CBC: Recent Labs  Lab 03/28/20 2206 03/29/20 1003 03/30/20 0336 03/31/20 0327 04/01/20 0906  WBC 11.0* 8.3 11.6* 10.5 8.9  HGB 13.5 13.1 12.5 11.8* 13.9  HCT 42.4 41.5 38.8 38.0 44.2  MCV 90.4 90.4 89.2 90.5 92.3  PLT 223 216 221 217 160   Basic Metabolic Panel: Recent Labs  Lab 03/28/20 2341 03/29/20 1003 03/30/20 0336 03/31/20 0327 04/01/20 0906  NA 141 139 136 138 138  K 3.6 3.9 4.3 4.3 4.6  CL 99 97* 100 99 95*  CO2 31 34* 30 30 33*  GLUCOSE 152* 116* 129* 136* 144*  BUN 16 16 23  29* 26*   CREATININE 1.08* 0.93 0.75 0.85 1.01*  CALCIUM 8.9 9.0 8.6* 8.8* 8.9  MG  --  1.7  --  1.9 2.0   GFR: Estimated Creatinine Clearance: 61.9 mL/min (A) (by C-G formula based on SCr of 1.01 mg/dL (H)). Liver Function Tests: Recent Labs  Lab 03/28/20 2341  AST 19  ALT 14  ALKPHOS 37*  BILITOT 0.5  PROT 6.9  ALBUMIN 3.3*   Recent Labs  Lab 03/28/20 2341  LIPASE 40   No results for input(s): AMMONIA in the last 168 hours. Coagulation Profile: No results for input(s): INR, PROTIME in the last 168 hours. Cardiac Enzymes: No results for input(s): CKTOTAL, CKMB, CKMBINDEX, TROPONINI in the last 168 hours. BNP (last 3 results) No results for input(s): PROBNP in the last 8760 hours. HbA1C: No results for input(s): HGBA1C in the last 72 hours. CBG: Recent Labs  Lab 03/31/20 0731 03/31/20 1121 03/31/20 1850 03/31/20 2102 04/01/20 0759  GLUCAP 99 103* 245* 101* 88   Lipid Profile: No results for input(s): CHOL, HDL, LDLCALC, TRIG, CHOLHDL, LDLDIRECT in the last 72 hours. Thyroid Function Tests: No results for input(s): TSH, T4TOTAL, FREET4, T3FREE, THYROIDAB in the last 72 hours. Anemia Panel: No results  for input(s): VITAMINB12, FOLATE, FERRITIN, TIBC, IRON, RETICCTPCT in the last 72 hours. Sepsis Labs: No results for input(s): PROCALCITON, LATICACIDVEN in the last 168 hours.  Recent Results (from the past 240 hour(s))  Respiratory Panel by RT PCR (Flu A&B, Covid) - Nasopharyngeal Swab     Status: None   Collection Time: 03/28/20 10:06 PM   Specimen: Nasopharyngeal Swab  Result Value Ref Range Status   SARS Coronavirus 2 by RT PCR NEGATIVE NEGATIVE Final    Comment: (NOTE) SARS-CoV-2 target nucleic acids are NOT DETECTED.  The SARS-CoV-2 RNA is generally detectable in upper respiratoy specimens during the acute phase of infection. The lowest concentration of SARS-CoV-2 viral copies this assay can detect is 131 copies/mL. A negative result does not preclude  SARS-Cov-2 infection and should not be used as the sole basis for treatment or other patient management decisions. A negative result may occur with  improper specimen collection/handling, submission of specimen other than nasopharyngeal swab, presence of viral mutation(s) within the areas targeted by this assay, and inadequate number of viral copies (<131 copies/mL). A negative result must be combined with clinical observations, patient history, and epidemiological information. The expected result is Negative.  Fact Sheet for Patients:  PinkCheek.be  Fact Sheet for Healthcare Providers:  GravelBags.it  This test is no t yet approved or cleared by the [place] FDA and  has been authorized for detection and/or diagnosis of SARS-CoV-2 by FDA under an Emergency Use Authorization (EUA). This EUA will remain  in effect (meaning this test can be used) for the duration of the COVID-19 declaration under Section 564(b)(1) of the Act, 21 U.S.C. section 360bbb-3(b)(1), unless the authorization is terminated or revoked sooner.     Influenza A by PCR NEGATIVE NEGATIVE Final   Influenza B by PCR NEGATIVE NEGATIVE Final    Comment: (NOTE) The Xpert Xpress SARS-CoV-2/FLU/RSV assay is intended as an aid in  the diagnosis of influenza from Nasopharyngeal swab specimens and  should not be used as a sole basis for treatment. Nasal washings and  aspirates are unacceptable for Xpert Xpress SARS-CoV-2/FLU/RSV  testing.  Fact Sheet for Patients: PinkCheek.be  Fact Sheet for Healthcare Providers: GravelBags.it  This test is not yet approved or cleared by the [place] FDA and  has been authorized for detection and/or diagnosis of SARS-CoV-2 by  FDA under an Emergency Use Authorization (EUA). This EUA will remain  in effect (meaning this test can be used) for the duration of the   Covid-19 declaration under Section 564(b)(1) of the Act, 21  U.S.C. section 360bbb-3(b)(1), unless the authorization is  terminated or revoked. Performed at [facility], [address], [place], [place] [address]       Radiology Studies: CARDIAC CATHETERIZATION  Result Date: 03/31/2020  Prox RCA lesion is 30% stenosed.  Mid RCA lesion is 40% stenosed.  RPDA lesion is 30% stenosed.  Prox Cx lesion is 60% stenosed.  Mid LAD lesion is 20% stenosed.  Mid Cx to Dist Cx lesion is 20% stenosed.  1.  Mild to moderate nonobstructive coronary artery disease.  Worst stenosis is 60% in the proximal left circumflex.  No evidence of obstructive disease.  Coronary arteries are overall moderately calcified. 2.  Right heart catheterization showed moderately elevated left-sided filling pressures, moderate pulmonary hypertension and moderately reduced cardiac output. 3.  Severe aortic stenosis with mean gradient of 31 mmHg and calculated valve area of 0.7 cm. Recommendations: The patient is significantly volume overloaded.  I switched furosemide  to intravenous 20 mg twice daily.  I increased carvedilol for better blood pressure control. Recommend medical therapy for nonobstructive coronary artery disease. Recommend outpatient TAVR evaluation.    Scheduled Meds: . aspirin EC  81 mg Oral Daily  . atorvastatin  80 mg Oral q1800  . carvedilol  12.5 mg Oral BID WC  . citalopram  40 mg Oral Daily  . enoxaparin (LOVENOX) injection  0.5 mg/kg Subcutaneous Q24H  . fenofibrate  160 mg Oral Daily  . furosemide  20 mg Intravenous BID  . gabapentin  600 mg Oral BID  . insulin aspart  0-20 Units Subcutaneous TID WC  . insulin aspart  0-5 Units Subcutaneous QHS  . levothyroxine  75 mcg Oral QAC breakfast  . losartan  25 mg Oral Daily  . melatonin  5 mg Oral QHS  . pantoprazole  40 mg Oral Daily  . polyethylene glycol  17 g Oral Daily  . predniSONE  40 mg Oral Q breakfast  . sodium chloride flush   3 mL Intravenous Q12H  . sodium chloride flush  3 mL Intravenous Q12H  . tiotropium  1 capsule Inhalation Daily   Continuous Infusions: . sodium chloride       LOS: 3 days   Time spent: 35 minutes   Aunya Lemler Loann Quill, MD [facility]  If 7PM-7AM, please contact night-coverage [email or web address] 04/01/2020, 10:15 AM

## 2020-04-01 NOTE — Care Management Important Message (Signed)
Important Message  Patient Details  Name: Kristin [name] MRN: [id number] Date of Birth: 02/15/1951   Medicare Important Message Given:  Yes     Dannette Barbara 04/01/2020, 10:59 AM

## 2020-04-01 NOTE — Plan of Care (Signed)
Pt lying in bed. Pt IV on left forearm clean, dry, intact, and saline locked. Pt went to ultrasound 04/01/2020. Pt gets up to commode on her own. Pt acknowledged that she will need to make diet modifications once she leaves the hospital. Pt daughter was at bedside during the conversation. Pt states she has no pain. Pt states that is not having trouble breathing. I will continue plan of care.   Maudry Mayhew, RN  04/01/2020 2A  Problem: Education: Goal: Knowledge of General Education information will improve Description: Including pain rating scale, medication(s)/side effects and non-pharmacologic comfort measures Outcome: Progressing   Problem: Health Behavior/Discharge Planning: Goal: Ability to manage health-related needs will improve Outcome: Progressing   Problem: Clinical Measurements: Goal: Ability to maintain clinical measurements within normal limits will improve Outcome: Progressing Goal: Will remain free from infection Outcome: Progressing Goal: Diagnostic test results will improve Outcome: Progressing Goal: Respiratory complications will improve Outcome: Progressing Goal: Cardiovascular complication will be avoided Outcome: Progressing   Problem: Activity: Goal: Risk for activity intolerance will decrease Outcome: Progressing   Problem: Nutrition: Goal: Adequate nutrition will be maintained Outcome: Progressing   Problem: Coping: Goal: Level of anxiety will decrease Outcome: Progressing   Problem: Elimination: Goal: Will not experience complications related to bowel motility Outcome: Progressing Goal: Will not experience complications related to urinary retention Outcome: Progressing   Problem: Pain Managment: Goal: General experience of comfort will improve Outcome: Progressing   Problem: Safety: Goal: Ability to remain free from injury will improve Outcome: Progressing   Problem: Skin Integrity: Goal: Risk for impaired skin integrity will  decrease Outcome: Progressing   Problem: Education: Goal: Ability to demonstrate management of disease process will improve Outcome: Progressing Goal: Ability to verbalize understanding of medication therapies will improve Outcome: Progressing Goal: Individualized Educational Video(s) Outcome: Progressing   Problem: Activity: Goal: Capacity to carry out activities will improve Outcome: Progressing   Problem: Cardiac: Goal: Ability to achieve and maintain adequate cardiopulmonary perfusion will improve Outcome: Progressing   Problem: Education: Goal: Understanding of CV disease, CV risk reduction, and recovery process will improve Outcome: Progressing Goal: Individualized Educational Video(s) Outcome: Progressing   Problem: Activity: Goal: Ability to return to baseline activity level will improve Outcome: Progressing   Problem: Cardiovascular: Goal: Ability to achieve and maintain adequate cardiovascular perfusion will improve Outcome: Progressing Goal: Vascular access site(s) Level 0-1 will be maintained Outcome: Progressing   Problem: Health Behavior/Discharge Planning: Goal: Ability to safely manage health-related needs after discharge will improve Outcome: Progressing

## 2020-04-01 NOTE — Progress Notes (Signed)
Pt asked if she could go out to smoke a cigarette. Explained to pt that smoking is not allowed on campus and that she has COPD, chronic respiratory failure and is on oxygen so she should not be smoking.

## 2020-04-02 DIAGNOSIS — R079 Chest pain, unspecified: Secondary | ICD-10-CM | POA: Diagnosis not present

## 2020-04-02 DIAGNOSIS — I5033 Acute on chronic diastolic (congestive) heart failure: Secondary | ICD-10-CM | POA: Diagnosis not present

## 2020-04-02 LAB — BASIC METABOLIC PANEL
Anion gap: 10 (ref 5–15)
BUN: 34 mg/dL — ABNORMAL HIGH (ref 8–23)
CO2: 35 mmol/L — ABNORMAL HIGH (ref 22–32)
Calcium: 9.1 mg/dL (ref 8.9–10.3)
Chloride: 93 mmol/L — ABNORMAL LOW (ref 98–111)
Creatinine, Ser: 1.06 mg/dL — ABNORMAL HIGH (ref 0.44–1.00)
GFR, Estimated: 58 mL/min — ABNORMAL LOW (ref >60)
Glucose, Bld: 114 mg/dL — ABNORMAL HIGH (ref 70–99)
Potassium: 4.3 mmol/L (ref 3.5–5.1)
Sodium: 138 mmol/L (ref 135–145)

## 2020-04-02 LAB — GLUCOSE, CAPILLARY: Glucose-Capillary: 122 mg/dL — ABNORMAL HIGH (ref 70–99)

## 2020-04-02 LAB — MAGNESIUM: Magnesium: 2.1 mg/dL (ref 1.7–2.4)

## 2020-04-02 MED ORDER — FUROSEMIDE 40 MG PO TABS: 40.0000 mg | ORAL_TABLET | Freq: Every day | ORAL | Status: DC

## 2020-04-02 MED ORDER — LOSARTAN POTASSIUM 25 MG PO TABS
25.0000 mg | ORAL_TABLET | Freq: Every day | ORAL | 3 refills | Status: DC
Start: 2020-04-03 — End: 2020-05-27

## 2020-04-02 MED ORDER — FUROSEMIDE 40 MG PO TABS: ORAL_TABLET | ORAL | 3 refills | Status: DC

## 2020-04-02 NOTE — Discharge Summary (Signed)
Physician Discharge Summary  Kristin [name] EPP:[id number] DOB: 21-Feb-1951 DOA: 03/28/2020  PCP: Lorelee Market, MD  Admit date: 03/28/2020 Discharge date: 04/02/2020  Time spent: 35 minutes  Recommendations for Outpatient Follow-up:  1. Temporarily increased Lasix from daily to twice daily for 3 days and then follow-up with cardiology regarding further dose changes 2. Lisinopril discontinued in favor losartan as risk of allergy with the same documented in the past 3. Needs Chem-12, CBC in about 1 week 4. Recommend outpatient weights and discussion about SGLT inhibitors in the outpatient setting  Discharge Diagnoses:  Principal Problem:   Chest pain Active Problems:   Chronic diastolic (congestive) heart failure (HCC)   Type 2 diabetes mellitus without complication (HCC)   COPD (chronic obstructive pulmonary disease) (HCC)   Obesity, Class III, BMI 40-49.9 (morbid obesity) (HCC)   Chronic respiratory failure with hypoxia (HCC)   OSA (obstructive sleep apnea)   Essential hypertension   Hypothyroidism   Elevated troponin   Severe aortic stenosis   Demand ischemia (HCC)   Acute on chronic heart failure with preserved ejection fraction (HFpEF) ([place])   Discharge Condition: Improved  Diet recommendation: Heart healthy low-salt diabetic  Filed Weights   03/31/20 1418 04/01/20 0516 04/02/20 0431  Weight: 107.2 kg 106.2 kg 106.3 kg    History of present illness:  69 year old white female COPD at baseline on 3 L of oxygen Continued tobacco abuse DM TY 2 OSA on CPAP HLD HFpEF with moderate aortic stenosis Patient came to [facility] developed central breast pain radiation to left arm flank Called EMS-nonspecific ST changes on EKG Troponin 25->240--- patient declined stress testing Cardiology consulted-cardiac cath performed 11/17 showed mild to moderate nonobstructive CAD with stenosis proximal left circumflex-right heart cath showed moderately elevated left-sided  pressures and moderate pulmonary hypertension and patient was recommended medical therapy and outpatient TAVR eval  Hospital Course:  1. NSTEMI mild troponin peak of 248 cath performed 11/16 mild to moderate nonobstructive disease likely secondary to severe aortic stenosis 2. Acute superimposed on chronic HFpEF-EF this admission 60-63% grade 1 diastolic dysfunction 3. Severe aortic stenosis valve area 1.11 cm gradient 31 1. Gently diuresed during admission with IV Lasix and transition to twice daily Lasix on discharge 40 mg for 3 days and then transition to p.o. Lasix to keep her dry weights were inaccurate during her hospital stay 2. Was seen by cardiology and cleared for discharge on 11/17 and has close follow-up with Dr. Angelena Form who I will add to this note 4. Prerenal azotemia secondary to diuresis 1. Reasonable rise of creatinine-would aim to keep more dry as weights are not accurate 5. DM TY 2 on oral medication 1. Resumed on discharge careful with Metformin  All other medical illnesses as below are relatively stable 6. Moderate to severe COPD on chronic oxygen 3 L at home 7. Hypothyroidism 8. Reflux 9. Chronic constipation 10. HLD 11. Depression   Procedures: Conclusion    Prox RCA lesion is 30% stenosed.  Mid RCA lesion is 40% stenosed.  RPDA lesion is 30% stenosed.  Prox Cx lesion is 60% stenosed.  Mid LAD lesion is 20% stenosed.  Mid Cx to Dist Cx lesion is 20% stenosed.   1.  Mild to moderate nonobstructive coronary artery disease.  Worst stenosis is 60% in the proximal left circumflex.  No evidence of obstructive disease.  Coronary arteries are overall moderately calcified. 2.  Right heart catheterization showed moderately elevated left-sided filling pressures, moderate pulmonary hypertension and moderately reduced cardiac output.  3.  Severe aortic stenosis with mean gradient of 31 mmHg and calculated valve area of 0.7 cm.  Recommendations: The patient is  significantly volume overloaded.  I switched furosemide to intravenous 20 mg twice daily.  I increased carvedilol for better blood pressure control. Recommend medical therapy for nonobstructive coronary artery disease. Recommend outpatient TAVR evaluation.    Discharge Exam: Vitals:   04/02/20 0431 04/02/20 0828  BP: 134/63 (!) 137/52  Pulse: 62 66  Resp: 18 18  Temp: (!) 97.5 F (36.4 C) 97.7 F (36.5 C)  SpO2: 96% 97%  Awake alert coherent no distress walking around the room had a bath just now Awake coherent no JVD no bruit Cardiovascular: S1-S2 no murmur no rub no gallop sinus on monitor Respiratory:  Clinically clear no added sound no rales no rhonchi Discharge Instructions   Discharge Instructions    Diet - low sodium heart healthy   Complete by: As directed    Discharge instructions   Complete by: As directed    You were diagnosed with decompensated heart failure this admission and probably would benefit from a higher dose of Lasix-the heart failure was secondary to your valve issue which should be followed up by Dr. Angelena Form in the outpatient setting Take Lasix twice a day for 3 days then go back to daily dosing. You will need lab work in about 1 week to make sure that your kidney function is maintaining I would limit salt as much as possible at this juncture and please follow-up with your outpatient cardiologist   Increase activity slowly   Complete by: As directed      Allergies as of 04/02/2020      Reactions   Altace [ramipril] Swelling      Medication List    STOP taking these medications   lisinopril 20 MG tablet Commonly known as: ZESTRIL     TAKE these medications   Accu-Chek Guide test strip Generic drug: glucose blood   aspirin EC 81 MG tablet Take 81 mg by mouth daily.   atorvastatin 80 MG tablet Commonly known as: LIPITOR Take 80 mg by mouth at bedtime.   carvedilol 3.125 MG tablet Commonly known as: COREG Take 1 tablet (3.125 mg  total) by mouth 2 (two) times daily with a meal.   citalopram 40 MG tablet Commonly known as: CELEXA Take 40 mg by mouth daily.   diclofenac Sodium 1 % Gel Commonly known as: VOLTAREN Apply 1 application topically 4 (four) times daily.   esomeprazole 40 MG capsule Commonly known as: NEXIUM Take 40 mg by mouth daily.   fenofibrate 160 MG tablet Take 160 mg by mouth daily.   furosemide 40 MG tablet Commonly known as: LASIX Take 1 tablet (40 mg total) by mouth daily for 3 days, THEN 1 tablet (40 mg total) daily. Start taking on: April 02, 2020 What changed: See the new instructions.   gabapentin 600 MG tablet Commonly known as: NEURONTIN Take 600 mg by mouth 2 (two) times daily.   hydrOXYzine 50 MG capsule Commonly known as: VISTARIL Take 50 mg by mouth every 6 (six) hours as needed for anxiety.   levothyroxine 75 MCG tablet Commonly known as: SYNTHROID Take 75 mcg by mouth daily before breakfast.   losartan 25 MG tablet Commonly known as: COZAAR Take 1 tablet (25 mg total) by mouth daily. Start taking on: April 03, 2020   metFORMIN 500 MG tablet Commonly known as: GLUCOPHAGE Take 500 mg by mouth 2 (two) times  daily.   Movantik 12.5 MG Tabs tablet Generic drug: naloxegol oxalate Take 12.5 mg by mouth daily.   oxyCODONE-acetaminophen 5-325 MG tablet Commonly known as: PERCOCET/ROXICET Take 1 tablet by mouth 3 (three) times daily.   OXYGEN Inhale 3 L into the lungs at bedtime.   ProAir HFA 108 (90 Base) MCG/ACT inhaler Generic drug: albuterol Inhale 2 puffs into the lungs every 4 (four) hours as needed for wheezing or shortness of breath.   sitaGLIPtin 100 MG tablet Commonly known as: JANUVIA Take 100 mg by mouth daily.   Spiriva Respimat 2.5 MCG/ACT Aers Generic drug: Tiotropium Bromide Monohydrate Inhale 2 puffs into the lungs daily.      Allergies  Allergen Reactions  . Altace [Ramipril] Swelling    Follow-up Information    Burnell Blanks, MD. Go on 04/04/2020.   Specialty: Cardiology Why: @ 9:30am to discuss your aortic valve problem Contact information: [place]. [address] [place] [address] [phone]                The results of significant diagnostics from this hospitalization (including imaging, microbiology, ancillary and laboratory) are listed below for reference.    Significant Diagnostic Studies: CARDIAC CATHETERIZATION  Result Date: 03/31/2020  Prox RCA lesion is 30% stenosed.  Mid RCA lesion is 40% stenosed.  RPDA lesion is 30% stenosed.  Prox Cx lesion is 60% stenosed.  Mid LAD lesion is 20% stenosed.  Mid Cx to Dist Cx lesion is 20% stenosed.  1.  Mild to moderate nonobstructive coronary artery disease.  Worst stenosis is 60% in the proximal left circumflex.  No evidence of obstructive disease.  Coronary arteries are overall moderately calcified. 2.  Right heart catheterization showed moderately elevated left-sided filling pressures, moderate pulmonary hypertension and moderately reduced cardiac output. 3.  Severe aortic stenosis with mean gradient of 31 mmHg and calculated valve area of 0.7 cm. Recommendations: The patient is significantly volume overloaded.  I switched furosemide to intravenous 20 mg twice daily.  I increased carvedilol for better blood pressure control. Recommend medical therapy for nonobstructive coronary artery disease. Recommend outpatient TAVR evaluation.   US Carotid Bilateral  Result Date: 04/01/2020 CLINICAL DATA:  69 year old female with preoperative study EXAM: BILATERAL CAROTID DUPLEX ULTRASOUND TECHNIQUE: Pearline Cables scale imaging, color Doppler and duplex ultrasound were performed of bilateral carotid and vertebral arteries in the neck. COMPARISON:  None. FINDINGS: Criteria: Quantification of carotid stenosis is based on velocity parameters that correlate the residual internal carotid diameter with NASCET-based stenosis levels, using the diameter of  the distal internal carotid lumen as the denominator for stenosis measurement. The following velocity measurements were obtained: RIGHT ICA:  Systolic 81 cm/sec, Diastolic 19 cm/sec CCA:  70 cm/sec SYSTOLIC ICA/CCA RATIO:  1.2 ECA:  52 cm/sec LEFT ICA:  Systolic 937 cm/sec, Diastolic 23 cm/sec CCA:  91 cm/sec SYSTOLIC ICA/CCA RATIO:  1.1 ECA:  73 cm/sec Right Brachial SBP: Not acquired Left Brachial SBP: Not acquired RIGHT CAROTID ARTERY: No significant calcifications of the right common carotid artery. Intermediate waveform maintained. Heterogeneous and partially calcified plaque at the right carotid bifurcation. No significant lumen shadowing. Low resistance waveform of the right ICA. No significant tortuosity. RIGHT VERTEBRAL ARTERY: Antegrade flow with low resistance waveform. LEFT CAROTID ARTERY: No significant calcifications of the left common carotid artery. Intermediate waveform maintained. Heterogeneous and partially calcified plaque at the left carotid bifurcation without significant lumen shadowing. Low resistance waveform of the left ICA. No significant tortuosity. LEFT VERTEBRAL ARTERY:  Antegrade flow with low resistance waveform. IMPRESSION: Color duplex indicates minimal heterogeneous and calcified plaque, with no hemodynamically significant stenosis by duplex criteria in the extracranial cerebrovascular circulation. Signed, Dulcy Fanny. Dellia Nims, RPVI [facility] Electronically Signed   By: Corrie Mckusick D.O.   On: 04/01/2020 10:59   DG Chest Portable 1 View  Result Date: 03/28/2020 CLINICAL DATA:  Chest pain EXAM: PORTABLE CHEST 1 VIEW COMPARISON:  07/04/2018 FINDINGS: Lungs are clear. No pneumothorax or pleural effusion. Cardiac size is mildly enlarged, unchanged. Pulmonary vascularity is normal. No acute bone abnormality. IMPRESSION: No active disease.  Stable cardiomegaly. Electronically Signed   By: Fidela Salisbury MD   On: 03/28/2020  22:16   ECHOCARDIOGRAM COMPLETE  Result Date: 03/29/2020    ECHOCARDIOGRAM REPORT   Patient Name:   Kristin [name] Date of Exam: 03/29/2020 Medical Rec #:  [id number]     Height:       62.0 in Accession #:    [id number]    Weight:       232.2 lb Date of Birth:  [birth year]    BSA:          2.037 m Patient Age:    69 years      BP:           139/64 mmHg Patient Gender: F             HR:           56 bpm. Exam Location:  [facility] Procedure: 2D Echo and Intracardiac Opacification Agent Indications:     Aortic Stenosis 135.0                  Chest Pain R07.9  History:         Patient has prior history of Echocardiogram examinations, most                  recent 07/05/2018.  Sonographer:     [place] Referring Phys:  [id number] Arvil Chaco Diagnosing Phys: Ida Rogue MD IMPRESSIONS  1. Left ventricular ejection fraction, by estimation, is 55 to 60%. The left ventricle has normal function. The left ventricle has no regional wall motion abnormalities. There is mild left ventricular hypertrophy. Left ventricular diastolic parameters are consistent with Grade I diastolic dysfunction (impaired relaxation).  2. Right ventricular systolic function is normal. The right ventricular size is normal. Tricuspid regurgitation signal is inadequate for assessing PA pressure.  3. Left atrial size was mildly dilated.  4. The aortic valve was not well visualized. Moderate to severe aortic valve stenosis. Aortic valve area, by VTI measures 1.11 cm. Aortic valve mean gradient measures 31.0 mmHg. Aortic valve Vmax measures 3.66 m/s. FINDINGS  Left Ventricle: Left ventricular ejection fraction, by estimation, is 55 to 60%. The left ventricle has normal function. The left ventricle has no regional wall motion abnormalities. Definity contrast agent was given IV to delineate the left ventricular  endocardial borders. The left ventricular internal cavity size was normal in size. There is mild left ventricular hypertrophy. Left  ventricular diastolic parameters are consistent with Grade I diastolic dysfunction (impaired relaxation). Right Ventricle: The right ventricular size is normal. No increase in right ventricular wall thickness. Right ventricular systolic function is normal. Tricuspid regurgitation signal is inadequate for assessing PA pressure. Left Atrium: Left atrial size was mildly dilated. Right Atrium: Right atrial size was normal in size. Pericardium: There is no evidence of pericardial effusion. Mitral Valve:  The mitral valve is normal in structure. Moderate mitral annular calcification. No evidence of mitral valve regurgitation. No evidence of mitral valve stenosis. Tricuspid Valve: The tricuspid valve is normal in structure. Tricuspid valve regurgitation is not demonstrated. No evidence of tricuspid stenosis. Aortic Valve: The aortic valve was not well visualized. Aortic valve regurgitation is not visualized. Moderate to severe aortic stenosis is present. Aortic valve mean gradient measures 31.0 mmHg. Aortic valve peak gradient measures 53.6 mmHg. Aortic valve area, by VTI measures 1.11 cm. Pulmonic Valve: The pulmonic valve was normal in structure. Pulmonic valve regurgitation is not visualized. No evidence of pulmonic stenosis. Aorta: The aortic root is normal in size and structure. Venous: The inferior vena cava is normal in size with greater than 50% respiratory variability, suggesting right atrial pressure of 3 mmHg. IAS/Shunts: No atrial level shunt detected by color flow Doppler.  LEFT VENTRICLE PLAX 2D LVIDd:         4.87 cm  Diastology LVIDs:         3.40 cm  LV e' medial:    4.13 cm/s LV PW:         1.22 cm  LV E/e' medial:  23.6 LV IVS:        1.32 cm  LV e' lateral:   7.62 cm/s LVOT diam:     1.90 cm  LV E/e' lateral: 12.8 LV SV:         95 LV SV Index:   47 LVOT Area:     2.84 cm  LEFT ATRIUM           Index LA diam:      4.70 cm 2.31 cm/m LA Vol (A4C): 23.7 ml 11.64 ml/m  AORTIC VALVE AV Area (Vmax):     0.98 cm AV Area (Vmean):   0.99 cm AV Area (VTI):     1.11 cm AV Vmax:           366.00 cm/s AV Vmean:          264.000 cm/s AV VTI:            0.858 m AV Peak Grad:      53.6 mmHg AV Mean Grad:      31.0 mmHg LVOT Vmax:         126.00 cm/s LVOT Vmean:        92.100 cm/s LVOT VTI:          0.335 m LVOT/AV VTI ratio: 0.39  AORTA Ao Root diam: 2.80 cm MITRAL VALVE MV Area (PHT): 4.54 cm     SHUNTS MV Decel Time: 167 msec     Systemic VTI:  0.34 m MV E velocity: 97.50 cm/s   Systemic Diam: 1.90 cm MV A velocity: 105.00 cm/s MV E/A ratio:  0.93 Ida Rogue MD Electronically signed by Ida Rogue MD Signature Date/Time: 03/29/2020/4:45:18 PM    Final     Microbiology: Recent Results (from the past 240 hour(s))  Respiratory Panel by RT PCR (Flu A&B, Covid) - Nasopharyngeal Swab     Status: None   Collection Time: 03/28/20 10:06 PM   Specimen: Nasopharyngeal Swab  Result Value Ref Range Status   SARS Coronavirus 2 by RT PCR NEGATIVE NEGATIVE Final    Comment: (NOTE) SARS-CoV-2 target nucleic acids are NOT DETECTED.  The SARS-CoV-2 RNA is generally detectable in upper respiratoy specimens during the acute phase of infection. The lowest concentration of SARS-CoV-2 viral copies this assay can detect is 131 copies/mL. A negative result  does not preclude SARS-Cov-2 infection and should not be used as the sole basis for treatment or other patient management decisions. A negative result may occur with  improper specimen collection/handling, submission of specimen other than nasopharyngeal swab, presence of viral mutation(s) within the areas targeted by this assay, and inadequate number of viral copies (<131 copies/mL). A negative result must be combined with clinical observations, patient history, and epidemiological information. The expected result is Negative.  Fact Sheet for Patients:  PinkCheek.be  Fact Sheet for Healthcare Providers:   GravelBags.it  This test is no t yet approved or cleared by the [place] FDA and  has been authorized for detection and/or diagnosis of SARS-CoV-2 by FDA under an Emergency Use Authorization (EUA). This EUA will remain  in effect (meaning this test can be used) for the duration of the COVID-19 declaration under Section 564(b)(1) of the Act, 21 U.S.C. section 360bbb-3(b)(1), unless the authorization is terminated or revoked sooner.     Influenza A by PCR NEGATIVE NEGATIVE Final   Influenza B by PCR NEGATIVE NEGATIVE Final    Comment: (NOTE) The Xpert Xpress SARS-CoV-2/FLU/RSV assay is intended as an aid in  the diagnosis of influenza from Nasopharyngeal swab specimens and  should not be used as a sole basis for treatment. Nasal washings and  aspirates are unacceptable for Xpert Xpress SARS-CoV-2/FLU/RSV  testing.  Fact Sheet for Patients: PinkCheek.be  Fact Sheet for Healthcare Providers: GravelBags.it  This test is not yet approved or cleared by the [place] FDA and  has been authorized for detection and/or diagnosis of SARS-CoV-2 by  FDA under an Emergency Use Authorization (EUA). This EUA will remain  in effect (meaning this test can be used) for the duration of the  Covid-19 declaration under Section 564(b)(1) of the Act, 21  U.S.C. section 360bbb-3(b)(1), unless the authorization is  terminated or revoked. Performed at [facility], [address] [place], [place] [address]      Labs: Basic Metabolic Panel: Recent Labs  Lab 03/29/20 1003 03/30/20 0336 03/31/20 0327 04/01/20 0906 04/02/20 0641  NA 139 136 138 138 138  K 3.9 4.3 4.3 4.6 4.3  CL 97* 100 99 95* 93*  CO2 34* 30 30 33* 35*  GLUCOSE 116* 129* 136* 144* 114*  BUN 16 23 29* 26* 34*  CREATININE 0.93 0.75 0.85 1.01* 1.06*  CALCIUM 9.0 8.6* 8.8* 8.9 9.1  MG 1.7  --  1.9 2.0 2.1   Liver Function  Tests: Recent Labs  Lab 03/28/20 2341  AST 19  ALT 14  ALKPHOS 37*  BILITOT 0.5  PROT 6.9  ALBUMIN 3.3*   Recent Labs  Lab 03/28/20 2341  LIPASE 40   No results for input(s): AMMONIA in the last 168 hours. CBC: Recent Labs  Lab 03/28/20 2206 03/29/20 1003 03/30/20 0336 03/31/20 0327 04/01/20 0906  WBC 11.0* 8.3 11.6* 10.5 8.9  HGB 13.5 13.1 12.5 11.8* 13.9  HCT 42.4 41.5 38.8 38.0 44.2  MCV 90.4 90.4 89.2 90.5 92.3  PLT 223 216 221 217 179   Cardiac Enzymes: No results for input(s): CKTOTAL, CKMB, CKMBINDEX, TROPONINI in the last 168 hours. BNP: BNP (last 3 results) Recent Labs    03/29/20 1003 04/01/20 0906  BNP 251.3* 80.0    ProBNP (last 3 results) No results for input(s): PROBNP in the last 8760 hours.  CBG: Recent Labs  Lab 04/01/20 0759 04/01/20 1212 04/01/20 1638 04/01/20 1955 04/02/20 0824  GLUCAP 88 218* 269* 200* 122*  Signed:  Nita Sells MD   [facility] 04/02/2020, 11:48 AM

## 2020-04-02 NOTE — Plan of Care (Signed)

## 2020-04-02 NOTE — Progress Notes (Signed)
Progress Note  Patient Name: Kristin [name] Date of Encounter: 04/02/2020  [facility] Cardiologist: Ida Rogue, MD   Subjective   Denies chest pain or shortness of breath.  Feels well, no acute events overnight.  Inpatient Medications    Scheduled Meds: . aspirin EC  81 mg Oral Daily  . atorvastatin  80 mg Oral q1800  . carvedilol  12.5 mg Oral BID WC  . citalopram  40 mg Oral Daily  . enoxaparin (LOVENOX) injection  0.5 mg/kg Subcutaneous Q24H  . fenofibrate  160 mg Oral Daily  . furosemide  40 mg Intravenous BID  . gabapentin  600 mg Oral BID  . insulin aspart  0-20 Units Subcutaneous TID WC  . insulin aspart  0-5 Units Subcutaneous QHS  . levothyroxine  75 mcg Oral QAC breakfast  . losartan  25 mg Oral Daily  . melatonin  5 mg Oral QHS  . pantoprazole  40 mg Oral Daily  . polyethylene glycol  17 g Oral Daily  . predniSONE  40 mg Oral Q breakfast  . sodium chloride flush  3 mL Intravenous Q12H  . sodium chloride flush  3 mL Intravenous Q12H  . tiotropium  1 capsule Inhalation Daily   Continuous Infusions: . sodium chloride     PRN Meds: sodium chloride, acetaminophen, albuterol, alum & mag hydroxide-simeth, hydrOXYzine, ipratropium-albuterol, nitroGLYCERIN, ondansetron (ZOFRAN) IV, sodium chloride flush   Vital Signs    Vitals:   04/01/20 1609 04/01/20 2044 04/02/20 0431 04/02/20 0828  BP: (!) 130/54 (!) 120/40 134/63 (!) 137/52  Pulse: 72 62 62 66  Resp: 20 20 18 18   Temp: 98.3 F (36.8 C) 98.5 F (36.9 C) (!) 97.5 F (36.4 C) 97.7 F (36.5 C)  TempSrc: Oral Oral Oral Oral  SpO2: 96% 96% 96% 97%  Weight:   106.3 kg   Height:        Intake/Output Summary (Last 24 hours) at 04/02/2020 1145 Last data filed at 04/02/2020 0500 Gross per 24 hour  Intake 840 ml  Output 2000 ml  Net -1160 ml   Last 3 Weights 04/02/2020 04/01/2020 03/31/2020  Weight (lbs) 234 lb 6.4 oz 234 lb 2.1 oz 236 lb 5.3 oz  Weight (kg) 106.323 kg 106.2 kg 107.2 kg       Telemetry    Sinus rhythm- Personally Reviewed  ECG    New tracing- Personally Reviewed  Physical Exam   GEN: No acute distress.   Neck: No JVD Cardiac: RRR, systolic murmur.  Respiratory:  Decreased breath sounds at bases GI: Soft, nontender, distended  MS: No edema; No deformity. Neuro:  Nonfocal  Psych: Normal affect   Labs    High Sensitivity Troponin:   Recent Labs  Lab 03/28/20 2206 03/28/20 2342 03/29/20 0151 03/29/20 1003 03/29/20 1424  TROPONINIHS 25* 89* 240* 248* 179*      Chemistry Recent Labs  Lab 03/28/20 2341 03/29/20 1003 03/31/20 0327 04/01/20 0906 04/02/20 0641  NA 141   < > 138 138 138  K 3.6   < > 4.3 4.6 4.3  CL 99   < > 99 95* 93*  CO2 31   < > 30 33* 35*  GLUCOSE 152*   < > 136* 144* 114*  BUN 16   < > 29* 26* 34*  CREATININE 1.08*   < > 0.85 1.01* 1.06*  CALCIUM 8.9   < > 8.8* 8.9 9.1  PROT 6.9  --   --   --   --  ALBUMIN 3.3*  --   --   --   --   AST 19  --   --   --   --   ALT 14  --   --   --   --   ALKPHOS 37*  --   --   --   --   BILITOT 0.5  --   --   --   --   GFRNONAA 56*   < > >60 >60 58*  ANIONGAP 11   < > 9 10 10    < > = values in this interval not displayed.     Hematology Recent Labs  Lab 03/30/20 0336 03/31/20 0327 04/01/20 0906  WBC 11.6* 10.5 8.9  RBC 4.35 4.20 4.79  HGB 12.5 11.8* 13.9  HCT 38.8 38.0 44.2  MCV 89.2 90.5 92.3  MCH 28.7 28.1 29.0  MCHC 32.2 31.1 31.4  RDW 15.0 15.0 15.4  PLT 221 217 179    BNP Recent Labs  Lab 03/29/20 1003 04/01/20 0906  BNP 251.3* 80.0     DDimer No results for input(s): DDIMER in the last 168 hours.   Radiology    CARDIAC CATHETERIZATION  Result Date: 03/31/2020  Prox RCA lesion is 30% stenosed.  Mid RCA lesion is 40% stenosed.  RPDA lesion is 30% stenosed.  Prox Cx lesion is 60% stenosed.  Mid LAD lesion is 20% stenosed.  Mid Cx to Dist Cx lesion is 20% stenosed.  1.  Mild to moderate nonobstructive coronary artery disease.  Worst stenosis  is 60% in the proximal left circumflex.  No evidence of obstructive disease.  Coronary arteries are overall moderately calcified. 2.  Right heart catheterization showed moderately elevated left-sided filling pressures, moderate pulmonary hypertension and moderately reduced cardiac output. 3.  Severe aortic stenosis with mean gradient of 31 mmHg and calculated valve area of 0.7 cm. Recommendations: The patient is significantly volume overloaded.  I switched furosemide to intravenous 20 mg twice daily.  I increased carvedilol for better blood pressure control. Recommend medical therapy for nonobstructive coronary artery disease. Recommend outpatient TAVR evaluation.   US Carotid Bilateral  Result Date: 04/01/2020 CLINICAL DATA:  69 year old female with preoperative study EXAM: BILATERAL CAROTID DUPLEX ULTRASOUND TECHNIQUE: Pearline Cables scale imaging, color Doppler and duplex ultrasound were performed of bilateral carotid and vertebral arteries in the neck. COMPARISON:  None. FINDINGS: Criteria: Quantification of carotid stenosis is based on velocity parameters that correlate the residual internal carotid diameter with NASCET-based stenosis levels, using the diameter of the distal internal carotid lumen as the denominator for stenosis measurement. The following velocity measurements were obtained: RIGHT ICA:  Systolic 81 cm/sec, Diastolic 19 cm/sec CCA:  70 cm/sec SYSTOLIC ICA/CCA RATIO:  1.2 ECA:  52 cm/sec LEFT ICA:  Systolic 563 cm/sec, Diastolic 23 cm/sec CCA:  91 cm/sec SYSTOLIC ICA/CCA RATIO:  1.1 ECA:  73 cm/sec Right Brachial SBP: Not acquired Left Brachial SBP: Not acquired RIGHT CAROTID ARTERY: No significant calcifications of the right common carotid artery. Intermediate waveform maintained. Heterogeneous and partially calcified plaque at the right carotid bifurcation. No significant lumen shadowing. Low resistance waveform of the right ICA. No significant tortuosity. RIGHT VERTEBRAL ARTERY: Antegrade flow  with low resistance waveform. LEFT CAROTID ARTERY: No significant calcifications of the left common carotid artery. Intermediate waveform maintained. Heterogeneous and partially calcified plaque at the left carotid bifurcation without significant lumen shadowing. Low resistance waveform of the left ICA. No significant tortuosity. LEFT VERTEBRAL ARTERY:  Antegrade flow with low resistance  waveform. IMPRESSION: Color duplex indicates minimal heterogeneous and calcified plaque, with no hemodynamically significant stenosis by duplex criteria in the extracranial cerebrovascular circulation. Signed, Dulcy Fanny. Dellia Nims, RPVI [facility] Radiology Electronically Signed   By: Corrie Mckusick D.O.   On: 04/01/2020 10:59    Cardiac Studies   R/LHC (03/31/20): 1. Mild to moderate nonobstructive coronary artery disease. Worst stenosis is 60% in the proximal left circumflex. No evidence of obstructive disease. Coronary arteries are overall moderately calcified. 2. Right heart catheterization showed moderately elevated left-sided filling pressures, moderate pulmonary hypertension and moderately reduced cardiac output. 3. Severe aortic stenosis with mean gradient of 31 mmHg and calculated valve area of 0.7 cm.  TTE (03/29/20): 1. Left ventricular ejection fraction, by estimation, is 55 to 60%. The  left ventricle has normal function. The left ventricle has no regional  wall motion abnormalities. There is mild left ventricular hypertrophy.  Left ventricular diastolic parameters  are consistent with Grade I diastolic dysfunction (impaired relaxation).  2. Right ventricular systolic function is normal. The right ventricular  size is normal. Tricuspid regurgitation signal is inadequate for assessing  PA pressure.  3. Left atrial size was mildly dilated.  4. The aortic valve was not well visualized. Moderate to severe aortic  valve stenosis. Aortic valve  area, by VTI measures 1.11 cm. Aortic valve  mean gradient measures 31.0 mmHg. Aortic valve Vmax measures 3.66 m/s.   Patient Profile     69 y.o. female with history of heart failure preserved ejection fraction, severe aortic stenosis, hypertension, COPD presenting with chest pain and elevated troponin.  Assessment & Plan    1.  Chest pain -Left heart cath with nonobstructive CAD -Currently chest pain-free -Aspirin, Lipitor, Coreg.  2.  Severe aortic stenosis -Outpatient appointment with structural team for TAVR work-up. -Lasix 40 mg daily  3. HFpEF -Denies shortness of breath -Appears euvolemic -Switch IV Lasix to 40 mg p.o.  Patient can be discharged from a cardiac perspective on current cardiac medications.  Keep follow-up appointment with structural team to evaluate aortic valve stenosis/possible TAVR. Total encounter time more 35 minutes  Greater than 50% was spent in counseling and coordination of care with the patient      Signed, Kate Sable, MD  04/02/2020, 11:45 AM

## 2020-04-03 NOTE — Progress Notes (Signed)
[facility] Consult Note  Chief Complaint  Patient presents with  . New Patient (Initial Visit)    aortic stenosis   History of Present Illness: 69 yo female with history of morbid obesity, tobacco abuse, CAD, anxiety, asthma, chronic diastolic CHF, COPD on supplemental O2, GERD, diabetes mellitus, hypothyroidism, sleep apnea and severe aortic stenosis, here today as a new consult, referred by Dr. Rockey Situ, for further evaluation of her aortic stenosis and discussion regarding AVR vs TAVR. She was admitted to [facility] 03/28/20 with chest pain. Mild troponin elevation. Cardiac cath 03/31/20 with mild to moderate non-obstructive CAD, 60% proximal Circumflex stenosis. Mean gradient 31 mmHg, AVA 0.7 cm2. Echo 03/29/20 with LVEF=55-60%, mild LVH, normal RV systolic function. The aortic valve is not well seen but mean gradient 31 mmHg, peak gradient 53.6 mmHg, AVA 0.98 cm2. She is felt to have paradoxical low flow,low gradient AS. She has been a daily smoker for 50 + years. She wears supplemental O2 24 hours per day. Carotid artery dopplers without significant plaque.   She tells me today that she has some chest pain at rest. Baseline dyspnea on supplemental O2. She has ongoing fatigue. She has no teeth and no dentures. She is retired from [employer] work. She lives with her daughter in [place].   Primary Care Physician: [name] Primary Cardiologist: Rockey Situ Referring Cardiologist: Rockey Situ  Past Medical History:  Diagnosis Date  . Anxiety   . Asthma   . CHF (congestive heart failure) ([place])    2018  . COPD (chronic obstructive pulmonary disease) ([place])   . Diabetes mellitus without complication ([place])   . Dyspnea   . GERD (gastroesophageal reflux disease)   . History of hiatal hernia   . History of orthopnea   . Hypothyroidism   . Neuropathy   . Oxygen deficiency    3L/HS  . Pain    BACK/DDD  . Peripheral vascular disease ([place])   . RLS (restless legs syndrome)   . Sleep apnea   .  Wheezing     Past Surgical History:  Procedure Laterality Date  . BREAST SURGERY    . CATARACT EXTRACTION W/PHACO Right 03/02/2018   Procedure: CATARACT EXTRACTION PHACO AND INTRAOCULAR LENS PLACEMENT ([place]);  Surgeon: Marchia Meiers, MD;  Location: [facility] ORS;  Service: Ophthalmology;  Laterality: Right;  [place] 01:15 CDE 16.46 Fluid pack lot # [id number]  . CATARACT EXTRACTION W/PHACO Left 04/27/2018   Procedure: CATARACT EXTRACTION PHACO AND INTRAOCULAR LENS PLACEMENT ([place])- LEFT DIABETIC;  Surgeon: Marchia Meiers, MD;  Location: [facility] ORS;  Service: Ophthalmology;  Laterality: Left;  Lot # [id number] [place]: 00:46.3 CDE: 8.07   . CYST EXCISION     FOREHEAD  . FOOT SURGERY     CYST  . RIGHT/LEFT HEART CATH AND CORONARY ANGIOGRAPHY N/A 03/31/2020   Procedure: RIGHT/LEFT HEART CATH AND CORONARY ANGIOGRAPHY;  Surgeon: Wellington Hampshire, MD;  Location: [facility];  Service: Cardiovascular;  Laterality: N/A;  . TUBAL LIGATION      Current Outpatient Medications  Medication Sig Dispense Refill  . ACCU-CHEK GUIDE test strip     . albuterol (PROAIR HFA) 108 (90 Base) MCG/ACT inhaler Inhale 2 puffs into the lungs every 4 (four) hours as needed for wheezing or shortness of breath.     Marland Kitchen aspirin EC 81 MG tablet Take 81 mg by mouth daily.    Marland Kitchen atorvastatin (LIPITOR) 80 MG tablet Take 80 mg by mouth at bedtime.     . carvedilol (COREG) 3.125  MG tablet Take 1 tablet (3.125 mg total) by mouth 2 (two) times daily with a meal. 60 tablet 0  . citalopram (CELEXA) 40 MG tablet Take 40 mg by mouth daily.    . diclofenac Sodium (VOLTAREN) 1 % GEL Apply 1 application topically 4 (four) times daily.     Marland Kitchen esomeprazole (NEXIUM) 40 MG capsule Take 40 mg by mouth daily.    . famotidine (PEPCID) 40 MG tablet Take 40 mg by mouth daily.    . fenofibrate 160 MG tablet Take 160 mg by mouth daily.    . ferrous sulfate 325 (65 FE) MG tablet Take 325 mg by mouth daily with breakfast.    . furosemide (LASIX) 40 MG  tablet Take 1 tablet (40 mg total) by mouth daily for 3 days, THEN 1 tablet (40 mg total) daily. 30 tablet 3  . gabapentin (NEURONTIN) 600 MG tablet Take 600 mg by mouth 2 (two) times daily.     . hydrOXYzine (VISTARIL) 50 MG capsule Take 50 mg by mouth every 6 (six) hours as needed for anxiety.    Marland Kitchen levothyroxine (SYNTHROID, LEVOTHROID) 75 MCG tablet Take 75 mcg by mouth daily before breakfast.    . losartan (COZAAR) 25 MG tablet Take 1 tablet (25 mg total) by mouth daily. 30 tablet 3  . metFORMIN (GLUCOPHAGE) 500 MG tablet Take 500 mg by mouth 2 (two) times daily.    Marland Kitchen MOVANTIK 12.5 MG TABS tablet Take 12.5 mg by mouth daily.     Marland Kitchen oxyCODONE-acetaminophen (PERCOCET/ROXICET) 5-325 MG tablet Take 1 tablet by mouth 3 (three) times daily.    . OXYGEN Inhale 3 L into the lungs at bedtime.     . sitaGLIPtin (JANUVIA) 100 MG tablet Take 100 mg by mouth daily.    . Tiotropium Bromide Monohydrate (SPIRIVA RESPIMAT) 2.5 MCG/ACT AERS Inhale 2 puffs into the lungs daily.      No current facility-administered medications for this visit.    Allergies  Allergen Reactions  . Altace [Ramipril] Swelling    Social History   Socioeconomic History  . Marital status: Widowed    Spouse name: Not on file  . Number of children: 4  . Years of education: Not on file  . Highest education level: Not on file  Occupational History  . Occupation: Health and safety inspector  Tobacco Use  . Smoking status: Current Every Day Smoker    Packs/day: 1.00    Years: 50.00    Pack years: 50.00    Types: Cigarettes  . Smokeless tobacco: Never Used  Substance and Sexual Activity  . Alcohol use: Not Currently  . Drug use: Not on file  . Sexual activity: Not on file  Other Topics Concern  . Not on file  Social History Narrative  . Not on file   Social Determinants of Health   Financial Resource Strain:   . Difficulty of Paying Living Expenses: Not on file  Food Insecurity:   . Worried About Charity fundraiser in  the Last Year: Not on file  . Ran Out of Food in the Last Year: Not on file  Transportation Needs:   . Lack of Transportation (Medical): Not on file  . Lack of Transportation (Non-Medical): Not on file  Physical Activity:   . Days of Exercise per Week: Not on file  . Minutes of Exercise per Session: Not on file  Stress:   . Feeling of Stress : Not on file  Social Connections:   . Frequency  of Communication with Friends and Family: Not on file  . Frequency of Social Gatherings with Friends and Family: Not on file  . Attends Religious Services: Not on file  . Active Member of Clubs or Organizations: Not on file  . Attends Archivist Meetings: Not on file  . Marital Status: Not on file  Intimate Partner Violence:   . Fear of Current or Ex-Partner: Not on file  . Emotionally Abused: Not on file  . Physically Abused: Not on file  . Sexually Abused: Not on file    Family History  Problem Relation Age of Onset  . Heart disease Mother   . Cancer Father     Review of Systems:  As stated in the HPI and otherwise negative.   BP 126/70   Pulse 75   Ht 5\' 2"  (1.575 m)   Wt 236 lb 6.4 oz (107.2 kg)   LMP  (LMP Unknown)   SpO2 97%   BMI 43.24 kg/m   Physical Examination: General: Obese female in NAD HEENT: OP clear, mucus membranes moist  SKIN: warm, dry. No rashes. Neuro: No focal deficits  Musculoskeletal: Muscle strength 5/5 all ext  Psychiatric: Mood and affect normal  Neck: No JVD, no carotid bruits, no thyromegaly, no lymphadenopathy.  Lungs:Clear bilaterally, no wheezes, rhonci, crackles Cardiovascular: Regular rate and rhythm. Loud, harsh, late peaking systolic murmur.  Abdomen:Soft. Bowel sounds present. Non-tender.  Extremities:  No lower extremity edema. Pulses are 2 + in the bilateral DP/PT.  EKG:  EKG is not ordered today. The ekg from 03/28/20 is reviewed by me and shows sinus tachycardia, lateral ST depression and poor R wave progression through the  precordial leads.   Echo 03/29/20: 1. Left ventricular ejection fraction, by estimation, is 55 to 60%. The  left ventricle has normal function. The left ventricle has no regional  wall motion abnormalities. There is mild left ventricular hypertrophy.  Left ventricular diastolic parameters  are consistent with Grade I diastolic dysfunction (impaired relaxation).  2. Right ventricular systolic function is normal. The right ventricular  size is normal. Tricuspid regurgitation signal is inadequate for assessing  PA pressure.  3. Left atrial size was mildly dilated.  4. The aortic valve was not well visualized. Moderate to severe aortic  valve stenosis. Aortic valve area, by VTI measures 1.11 cm. Aortic valve  mean gradient measures 31.0 mmHg. Aortic valve Vmax measures 3.66 m/s.   FINDINGS  Left Ventricle: Left ventricular ejection fraction, by estimation, is 55  to 60%. The left ventricle has normal function. The left ventricle has no  regional wall motion abnormalities. Definity contrast agent was given IV  to delineate the left ventricular  endocardial borders. The left ventricular internal cavity size was normal  in size. There is mild left ventricular hypertrophy. Left ventricular  diastolic parameters are consistent with Grade I diastolic dysfunction  (impaired relaxation).   Right Ventricle: The right ventricular size is normal. No increase in  right ventricular wall thickness. Right ventricular systolic function is  normal. Tricuspid regurgitation signal is inadequate for assessing PA  pressure.   Left Atrium: Left atrial size was mildly dilated.   Right Atrium: Right atrial size was normal in size.   Pericardium: There is no evidence of pericardial effusion.   Mitral Valve: The mitral valve is normal in structure. Moderate mitral  annular calcification. No evidence of mitral valve regurgitation. No  evidence of mitral valve stenosis.   Tricuspid Valve: The  tricuspid valve is normal  in structure. Tricuspid  valve regurgitation is not demonstrated. No evidence of tricuspid  stenosis.   Aortic Valve: The aortic valve was not well visualized. Aortic valve  regurgitation is not visualized. Moderate to severe aortic stenosis is  present. Aortic valve mean gradient measures 31.0 mmHg. Aortic valve peak  gradient measures 53.6 mmHg. Aortic  valve area, by VTI measures 1.11 cm.   Pulmonic Valve: The pulmonic valve was normal in structure. Pulmonic valve  regurgitation is not visualized. No evidence of pulmonic stenosis.   Aorta: The aortic root is normal in size and structure.   Venous: The inferior vena cava is normal in size with greater than 50%  respiratory variability, suggesting right atrial pressure of 3 mmHg.   IAS/Shunts: No atrial level shunt detected by color flow Doppler.     LEFT VENTRICLE  PLAX 2D  LVIDd:     4.87 cm Diastology  LVIDs:     3.40 cm LV e' medial:  4.13 cm/s  LV PW:     1.22 cm LV E/e' medial: 23.6  LV IVS:    1.32 cm LV e' lateral:  7.62 cm/s  LVOT diam:   1.90 cm LV E/e' lateral: 12.8  LV SV:     95  LV SV Index:  47  LVOT Area:   2.84 cm     LEFT ATRIUM      Index  LA diam:   4.70 cm 2.31 cm/m  LA Vol (A4C): 23.7 ml 11.64 ml/m  AORTIC VALVE  AV Area (Vmax):  0.98 cm  AV Area (Vmean):  0.99 cm  AV Area (VTI):   1.11 cm  AV Vmax:      366.00 cm/s  AV Vmean:     264.000 cm/s  AV VTI:      0.858 m  AV Peak Grad:   53.6 mmHg  AV Mean Grad:   31.0 mmHg  LVOT Vmax:     126.00 cm/s  LVOT Vmean:    92.100 cm/s  LVOT VTI:     0.335 m  LVOT/AV VTI ratio: 0.39    AORTA  Ao Root diam: 2.80 cm   MITRAL VALVE  MV Area (PHT): 4.54 cm   SHUNTS  MV Decel Time: 167 msec   Systemic VTI: 0.34 m  MV E velocity: 97.50 cm/s  Systemic Diam: 1.90 cm  MV A velocity: 105.00 cm/s  MV E/A ratio: 0.93   Cardiac cath  03/31/20:  Prox RCA lesion is 30% stenosed.  Mid RCA lesion is 40% stenosed.  RPDA lesion is 30% stenosed.  Prox Cx lesion is 60% stenosed.  Mid LAD lesion is 20% stenosed.  Mid Cx to Dist Cx lesion is 20% stenosed.   1.  Mild to moderate nonobstructive coronary artery disease.  Worst stenosis is 60% in the proximal left circumflex.  No evidence of obstructive disease.  Coronary arteries are overall moderately calcified. 2.  Right heart catheterization showed moderately elevated left-sided filling pressures, moderate pulmonary hypertension and moderately reduced cardiac output. 3.  Severe aortic stenosis with mean gradient of 31 mmHg and calculated valve area of 0.7 cm.  Recommendations: The patient is significantly volume overloaded.  I switched furosemide to intravenous 20 mg twice daily.  I increased carvedilol for better blood pressure control. Recommend medical therapy for nonobstructive coronary artery disease. Recommend outpatient TAVR evaluation.  Left Main  Vessel is angiographically normal.  Left Anterior Descending  Mid LAD lesion is 20% stenosed. The lesion is mildly calcified.  Left Circumflex  Prox Cx lesion is 60% stenosed. The lesion is moderately calcified.  Mid Cx to Dist Cx lesion is 20% stenosed.  Second Obtuse Marginal Branch  Vessel is angiographically normal.  Third Obtuse Marginal Branch  Vessel is angiographically normal.  Right Coronary Artery  Prox RCA lesion is 30% stenosed. The lesion is mildly calcified.  Mid RCA lesion is 40% stenosed. The lesion is mildly calcified.  Right Posterior Descending Artery  RPDA lesion is 30% stenosed.  Intervention  No interventions have been documented. Coronary Diagrams  Diagnostic Dominance: Right  Intervention  Implants   No implant documentation for this case.  Syngo Images  Show images for CARDIAC CATHETERIZATION Images on Long Term Storage  Show images for Stiner, Lanasia A Link to Procedure  Log  Procedure Log    Hemo Data (last 6 days) before discharge   AO Systolic Cath Pressure  AO Diastolic Cath Pressure  AO Mean Cath Pressure  LV Systolic Cath Pressure  LV End Diastolic  LV Systolic  LV End Diastolic  LV dP/dt  PA Systolic Cath Pressure  PA Diastolic Cath Pressure  PA Mean Cath Pressure  RA Wedge A Wave  RA Wedge V Wave  RV Systolic Cath Pressure  RV Diastolic Cath Pressure  RV End Diastolic  RV Systolic  RV End Diastolic  RV dP/dt  PCW A Wave  PCW V Wave  PCW Mean  AO O2 Sat  PA O2 Sat  AO O2 Sat  Fick C.O.  Fick C.I.   --  --  --  --  --  202 mmHg  42 mmHg  1296 mmHg/sec  --  --  --  14 mmHg  9 mmHg  --  --  --  44 mmHg  9 mmHg  384 mmHg/sec  33 mmHg  35 mmHg  27 mmHg  --  --  --  4.19 L/min  2.05 L/min/m2   --  --  --  --  --  --  --  --  --  --  --  --  --  44 mmHg  5 mmHg  9 mmHg  --  --  --  --  --  --  --  --  --  --  --   --  --  --  --  --  --  --  --  47 mmHg  27 mmHg  35 mmHg  --  --  --  --  --  --  --  --  --  --  --  --  --  --  --  --   --  --  --  207 mmHg  31 mmHg  --  --  --  --  --  --  --  --  --  --  --  --  --  --  --  --  --  --  --  --  --  --   --  --  --  218 mmHg  32 mmHg  --  --  --  --  --  --  --  --  --  --  --  --  --  --  --  --  --  --  --  --  --  --   --  --  --  202 mmHg  42 mmHg  --  --  --  --  --  --  --  --  --  --  --  --  --  --  --  --  --  --  --  --  --  --  175  76 mmHg  115 mmHg  --  --  --  --  --  --  --  --  --  --  --  --  --  --  --  --  --  --  --  --  --  --  --  --   --  --  --  --  --  --  --  --  --  --  --  --  --  --  --  --  --  --  --  --  --  --  95.9 %  --  SA  --  --   --  --  --  --  --                                                 Recent Labs: 03/28/2020: ALT 14 03/29/2020: TSH 0.623 04/01/2020: B Natriuretic Peptide 80.0; Hemoglobin 13.9; Platelets 179 04/02/2020: BUN 34; Creatinine, Ser 1.06; Magnesium 2.1; Potassium 4.3; Sodium 138   Lipid Panel    Component Value Date/Time   CHOL 193 03/29/2020  1003   TRIG 140 03/29/2020 1003   HDL 52 03/29/2020 1003   CHOLHDL 3.7 03/29/2020 1003   VLDL 28 03/29/2020 1003   LDLCALC 113 (H) 03/29/2020 1003     Wt Readings from Last 3 Encounters:  04/04/20 236 lb 6.4 oz (107.2 kg)  04/02/20 234 lb 6.4 oz (106.3 kg)  10/09/19 235 lb 4 oz (106.7 kg)     Other studies Reviewed: Additional studies/ records that were reviewed today include: hospital notes, echo images, cath images, ekg Review of the above records demonstrates: Moderately severe to severe AS   Assessment and Plan:   1. Severe Aortic Valve Stenosis: She has moderately severe to severe aortic stenosis. This is possibly paradoxical low flow/low gradient AS. I have personally reviewed the echo images. The aortic valve is not well seen on the TTE performed at [facility]. The mean gradient is 31 mmHg on echo and cath. AVA 0.7 cm2 by cath and 0.98 cm2 by echo. Dimensionless index is 0.39. LV SVI 47. I think the pre TAVR CT scans will help define her disease as I am not certain she truly has severe AS. Will review her studies with the imaging team. She will not be a good candidate for surgical AVR given morbid obesity and COPD. She may be a candidate for TAVR.    STS Risk Score: Risk of Mortality: 3.045% Renal Failure: 3.151% Permanent Stroke: 0.619% Prolonged Ventilation: 11.641% DSW Infection: 0.247% Reoperation: 1.878% Morbidity or Mortality: 15.156% Short Length of Stay: 28.549% Long Length of Stay: 7.541%  I have reviewed the natural history of aortic stenosis with the patient and their family members  who are present today. We have discussed the limitations of medical therapy and the poor prognosis associated with symptomatic aortic stenosis. We have reviewed potential treatment options, including palliative medical therapy, conventional surgical aortic valve replacement, and transcatheter aortic valve replacement. We discussed treatment options in the context of the patient's  specific comorbid medical conditions.   She would like to proceed with planning for TAVR. We will proceed with her CT scans. I will review her case with the valve team. Risks and benefits of the valve procedure are reviewed with the patient.      Current medicines are  reviewed at length with the patient today.  The patient does not have concerns regarding medicines.  The following changes have been made:  no change  Labs/ tests ordered today include:  No orders of the defined types were placed in this encounter.    Disposition:   FU with the valve team.    Signed, Lauree Chandler, MD 04/04/2020 10:08 AM    [address], [place], [place]  [address] Phone: [phone]; Fax: [phone]

## 2020-04-04 ENCOUNTER — Other Ambulatory Visit: Payer: Self-pay | Admitting: Physician Assistant

## 2020-04-04 ENCOUNTER — Other Ambulatory Visit: Payer: Self-pay

## 2020-04-04 ENCOUNTER — Ambulatory Visit (INDEPENDENT_AMBULATORY_CARE_PROVIDER_SITE_OTHER): Payer: Medicare HMO | Admitting: Cardiovascular Disease

## 2020-04-04 ENCOUNTER — Encounter: Payer: Self-pay | Admitting: Physician Assistant

## 2020-04-04 ENCOUNTER — Encounter: Payer: Self-pay | Admitting: Cardiovascular Disease

## 2020-04-04 VITALS — BP 126/70 | HR 75 | Ht 62.0 in | Wt 236.4 lb

## 2020-04-04 DIAGNOSIS — I350 Nonrheumatic aortic (valve) stenosis: Secondary | ICD-10-CM

## 2020-04-04 NOTE — Patient Instructions (Signed)
Medication Instructions:  Your physician recommends that you continue on your current medications as directed. Please refer to the Current Medication list given to you today.  *If you need a refill on your cardiac medications before your next appointment, please call your pharmacy*   Lab Work: None If you have labs (blood work) drawn today and your tests are completely normal, you will receive your results only by: Marland Kitchen MyChart Message (if you have MyChart) OR . A paper copy in the mail If you have any lab test that is abnormal or we need to change your treatment, we will call you to review the results.   Testing/Procedures: Theodosia Quay, RN will be in contact to get you scheduled for your scans.   Follow-Up: Follow up will be scheduled after procedure

## 2020-04-08 ENCOUNTER — Telehealth: Payer: Self-pay | Admitting: Cardiovascular Disease

## 2020-04-08 ENCOUNTER — Encounter: Payer: Self-pay | Admitting: Physical Therapy

## 2020-04-08 ENCOUNTER — Other Ambulatory Visit: Payer: Self-pay

## 2020-04-08 ENCOUNTER — Ambulatory Visit (HOSPITAL_COMMUNITY): Payer: Medicare HMO

## 2020-04-08 ENCOUNTER — Ambulatory Visit (HOSPITAL_COMMUNITY)
Admission: RE | Admit: 2020-04-08 | Discharge: 2020-04-08 | Disposition: A | Discharge status: Home / Self Care | Payer: Medicare HMO | Source: Ambulatory Visit | Attending: Physician Assistant | Admitting: Physician Assistant

## 2020-04-08 ENCOUNTER — Ambulatory Visit: Payer: Medicare HMO | Admitting: Physical Therapy

## 2020-04-08 ENCOUNTER — Ambulatory Visit: Payer: Medicare HMO | Attending: Physician Assistant | Admitting: Physical Therapy

## 2020-04-08 DIAGNOSIS — M6281 Muscle weakness (generalized): Secondary | ICD-10-CM

## 2020-04-08 DIAGNOSIS — R2689 Other abnormalities of gait and mobility: Secondary | ICD-10-CM

## 2020-04-08 DIAGNOSIS — I350 Nonrheumatic aortic (valve) stenosis: Secondary | ICD-10-CM | POA: Diagnosis not present

## 2020-04-08 MED ORDER — IOHEXOL 350 MG/ML SOLN
100.0000 mL | Freq: Once | INTRAVENOUS | Status: AC | PRN
  Administered 2020-04-08: 100 mL via INTRAVENOUS

## 2020-04-08 NOTE — Telephone Encounter (Signed)
I called to speak with the patient and was told the patient is at her physical therapy appointment at this time.  They worked out the oxygen situation and then she hung up.

## 2020-04-08 NOTE — Telephone Encounter (Signed)
Patient referred to physical therapy by Angelena Form  Patient's daughter called stating they would not be able to make it to mom's physical therapy appointment as they did not bring the oxygen tank and did not believe should could make it through the appointment without it. Patient's daughter asked that Curt Bears call her to explain why the patient needs physical therapy and what it would help with.   Please call/advise.   Thank you!

## 2020-04-08 NOTE — Therapy (Signed)
[place], [place], [address] Phone: [phone]   Fax:  [phone]  Physical Therapy Evaluation  Patient Details  Name: Kristin [name] MRN: [id number] Date of Birth: 02-Sep-1950 Referring Provider (PT): Angelena Form PA-C   Encounter Date: 04/08/2020   PT End of Session - 04/08/20 1331    Visit Number 1    Number of Visits 1    Date for PT Re-Evaluation 04/09/20    PT Start Time 6606    PT Stop Time 1400    PT Time Calculation (min) 32 min    Activity Tolerance Patient tolerated treatment well           Past Medical History:  Diagnosis Date  . Anxiety   . Asthma   . CHF (congestive heart failure) ([place])    2018  . COPD (chronic obstructive pulmonary disease) ([place])   . Diabetes mellitus without complication ([place])   . Dyspnea   . GERD (gastroesophageal reflux disease)   . History of hiatal hernia   . History of orthopnea   . Hypothyroidism   . Neuropathy   . Oxygen deficiency    3L/HS  . Pain    BACK/DDD  . Peripheral vascular disease ([place])   . RLS (restless legs syndrome)   . Sleep apnea   . Wheezing     Past Surgical History:  Procedure Laterality Date  . BREAST SURGERY    . CATARACT EXTRACTION W/PHACO Right 03/02/2018   Procedure: CATARACT EXTRACTION PHACO AND INTRAOCULAR LENS PLACEMENT ([place]);  Surgeon: Marchia Meiers, MD;  Location: [facility];  Service: Ophthalmology;  Laterality: Right;  [place] 01:15 CDE 16.46 Fluid pack lot # [id number]  . CATARACT EXTRACTION W/PHACO Left 04/27/2018   Procedure: CATARACT EXTRACTION PHACO AND INTRAOCULAR LENS PLACEMENT ([place])- LEFT DIABETIC;  Surgeon: Marchia Meiers, MD;  Location: [facility];  Service: Ophthalmology;  Laterality: Left;  Lot # [id number] [place]: 00:46.3 CDE: 8.07   . CYST EXCISION     FOREHEAD  . FOOT SURGERY     CYST  . RIGHT/LEFT HEART CATH AND CORONARY ANGIOGRAPHY N/A 03/31/2020   Procedure: RIGHT/LEFT HEART CATH AND CORONARY  ANGIOGRAPHY;  Surgeon: Wellington Hampshire, MD;  Location: [facility];  Service: Cardiovascular;  Laterality: N/A;  . TUBAL LIGATION      There were no vitals filed for this visit.    Subjective Assessment - 04/08/20 1337    Subjective pt is a 69 y.o F with CC chest pain, and hx of COPD and asthma. She went to the hospital due to having chest pain and was transported via ambulance about 1 week. Currently uses a SPC at home and home O2 set at 3LPM used frequently but forgot her cane and O2 at home.    Patient Stated Goals to fix the heart    Currently in Pain? No/denies              [facility] PT Assessment - 04/08/20 1329      Assessment   Medical Diagnosis Severe aortic stenosis    Referring Provider (PT) Angelena Form PA-C    Onset Date/Surgical Date --   1 week   Hand Dominance Right      Precautions   Precaution Comments no lifting      Restrictions   Weight Bearing Restrictions No      Balance Screen   Has the patient fallen in the past 6 months Yes    How many times?  1    Has the patient had a decrease in activity level because of a fear of falling?  No    Is the patient reluctant to leave their home because of a fear of falling?  No      Home Environment   Living Environment Private residence    Living Arrangements Children    Available Help at Discharge Family    Type of [place] to enter    Entrance Stairs-Number of Steps 4    Entrance Stairs-Rails None    Home Layout One level    [place] - single point;Grab bars - toilet;Grab bars - tub/shower;Shower seat;Bedside commode;Walker - 2 wheels   O2,    Additional Comments forgot SPC and O2 at home      ROM / Strength   AROM / PROM / Strength AROM;Strength      AROM   Overall AROM  Within functional limits for tasks performed    Overall AROM Comments mild limitation with RUE ER      Strength   Overall Strength Comments Bil UE gross 4-/5, bil LE 4/5    Strength  Assessment Site Hand    Right Hand Grip (lbs) 29    Left Hand Grip (lbs) 32      Ambulation/Gait   Ambulation/Gait Yes    Assistive device None   but uses a SPC at home   Gait Pattern Step-through pattern;Shuffle;Decreased trunk rotation;Trunk flexed    Gait Comments pt ambulated 203 ft in 3:05 requiring 38 second rest breatk with O2 84, and HR at 105, She resumed 165 ft finishing 6 min walk test             [facility] Pre-Surgical Assessment - 04/08/20 0001    5 Meter Walk Test- trial 1 8 sec    5 Meter Walk Test- trial 2 8 sec.     5 Meter Walk Test- trial 3 9 sec.    5 meter walk test average 8.33 sec    4 Stage Balance Test tolerated for:  3 sec.    4 Stage Balance Test Position 3    Sit To Stand Test- trial 1 27 sec.    ADL/IADL Independent with: Bathing;Dressing    ADL/IADL Needs Assistance with: Meal prep;Finances;Yard work    ADL/IADL Therapist, sports Index Moderately frail    6 Minute Walk- Baseline yes    BP (mmHg) 158/62    HR (bpm) 78    02 Sat (%RA) 96 %    Modified Borg Scale for Dyspnea 0- Nothing at all    Perceived Rate of Exertion (Borg) 6-    6 Minute Walk Post Test yes    BP (mmHg) 168/67    HR (bpm) 105    02 Sat (%RA) 84 %    Modified Borg Scale for Dyspnea 2- Mild shortness of breath    Perceived Rate of Exertion (Borg) 11- Fairly light    Aerobic Endurance Distance Walked 368    Endurance additional comments pt is 79.15% limited compared to age related norm                    Objective measurements completed on examination: See above findings.               PT Education - 04/08/20 1416    Education Details Discussed importance of keeping her O2 and SPC with her or close by at all times to  maximize her safety, especially if she goes out.    Person(s) Educated Patient    Methods Explanation;Verbal cues;Handout    Comprehension Verbalized understanding;Verbal cues required                       Plan - 04/08/20 1330     Clinical Impression Statement pt's daughter noted she left home without her home O2, discussed safety of treatment especially if she uses then constantly. patient's daughter stated it takes 45 min to get home and that it will throw off their scheduling and requested to go ahead and be seen.   See TAVR assessment in note.   Stability/Clinical Decision Making Stable/Uncomplicated    Clinical Decision Making Low    Rehab Potential Good    PT Frequency One time visit    PT Next Visit Plan Pre- TAVR evaluation    Consulted and Agree with Plan of Care Patient          Clinical Impression Statement: pt states she uses a SPC at home and home O2 but notes she forgot it at home and lives 45 min away and didn't want to delay her PT appointment and opted to go through with the evaluation. Pt is a 69 yo F presenting to OP PT for evaluation prior to possible TAVR surgery due to severe aortic stenosis. Pt reports onset of Chest pain and general fatigue approximately about 1 week ago. Symptoms are limiting endurance. Pt presents with functoinal ROM iwith mild RUE ER and functional strength, limited balance and is assessed as moderate at high fall risk 4 stage balance test, limited walking speed and limited aerobic endurance per 6 minute walk test. pt ambulated 203 ft in 3:05 requiring 38 second rest break with O2 84, and HR at 105 on Room air, She resumed  Walking 165 ft finishing 6 min walk test .Pt reported 2/10 shortness of breath on modified scale for dyspnea. Pt ambulated a total of 368 feet in 6 minute walk. General fatigue, Shortness of breath  increased significantly with 6 minute walk test. Based on the Short Physical Performance Battery, patient has a frailty rating of 6/12 with </= 5/12 considered frail.      Patient demonstrated the following deficits and impairments:     Visit Diagnosis: Muscle weakness (generalized)  Other abnormalities of gait and mobility     Problem List Patient Active  Problem List   Diagnosis Date Noted  . Demand ischemia ([place])   . Acute on chronic heart failure with preserved ejection fraction (HFpEF) ([place])   . Severe aortic stenosis   . Obesity, Class III, BMI 40-49.9 (morbid obesity) ([place]) 03/29/2020  . Chronic respiratory failure with hypoxia ([place]) 03/29/2020  . OSA (obstructive sleep apnea) 03/29/2020  . Essential hypertension 03/29/2020  . Hypothyroidism 03/29/2020  . Chest pain 03/29/2020  . Elevated troponin 03/29/2020  . Chronic diastolic (congestive) heart failure ([place]) 07/12/2018  . Type 2 diabetes mellitus without complication ([place]) 25/42/7062  . COPD (chronic obstructive pulmonary disease) ([place]) 07/12/2018  . Tobacco use 07/12/2018  . Hypotension 07/12/2018  . Respiratory failure ([place]) 07/04/2018   Starr [place] PT, DPT, LAT, ATC  04/08/20  2:20 PM      [facility] [address] [place], [place], [address] Phone: [phone]   Fax:  [phone]  Name: Kristin [name] MRN: [id number] Date of Birth: Feb 23, 1951

## 2020-04-14 ENCOUNTER — Other Ambulatory Visit: Payer: Self-pay | Admitting: Thoracic Surgery (Cardiothoracic Vascular Surgery)

## 2020-04-14 ENCOUNTER — Telehealth: Payer: Self-pay | Admitting: Cardiovascular Disease

## 2020-04-14 ENCOUNTER — Other Ambulatory Visit: Payer: Self-pay

## 2020-04-14 ENCOUNTER — Other Ambulatory Visit: Payer: Self-pay | Admitting: Pulmonary Disease

## 2020-04-14 ENCOUNTER — Institutional Professional Consult (permissible substitution) (INDEPENDENT_AMBULATORY_CARE_PROVIDER_SITE_OTHER): Payer: Medicare HMO | Admitting: Thoracic Surgery (Cardiothoracic Vascular Surgery)

## 2020-04-14 ENCOUNTER — Encounter: Payer: Self-pay | Admitting: Thoracic Surgery (Cardiothoracic Vascular Surgery)

## 2020-04-14 ENCOUNTER — Encounter: Payer: Medicare HMO | Admitting: Thoracic Surgery (Cardiothoracic Vascular Surgery)

## 2020-04-14 VITALS — BP 135/69 | HR 69 | Temp 97.6°F | Resp 20 | Ht 62.0 in | Wt 230.0 lb

## 2020-04-14 DIAGNOSIS — I350 Nonrheumatic aortic (valve) stenosis: Secondary | ICD-10-CM

## 2020-04-14 NOTE — Patient Instructions (Signed)
Continue all previous medications without any changes at this time  Stop smoking immediately and permanently.

## 2020-04-14 NOTE — Telephone Encounter (Signed)
Pt will be seeing Dr. Roxy Manns and will discuss then. cdm

## 2020-04-14 NOTE — Progress Notes (Signed)
HEART AND [place] SURGERY CONSULTATION REPORT  Referring Provider is Wellington Hampshire, MD Primary Cardiologist is Ida Rogue, MD PCP is Lorelee Market, MD  Chief Complaint  Patient presents with  . Aortic Stenosis    Surgical consult for TAVR, review all testing    HPI:  Patient is a 69 year old morbidly obese female with history of chronic hypoxemic respiratory failure on home oxygen therapy, longstanding and ongoing heavy tobacco abuse, COPD, chronic diastolic congestive heart failure, obstructive sleep apnea, anxiety, asthma, type 2 diabetes mellitus, GE reflux disease, hypothyroidism, and limited physical mobility who has been referred for surgical consultation to discuss treatment options for management of aortic stenosis.  Patient states that she has had severe shortness of breath and been on home oxygen therapy for at least 10 years.  Despite this she continues to smoke in excess of 1 pack of cigarettes daily (she states that she does disconnect the oxygen when she smokes cigarettes).  She has longstanding exertional shortness of breath and intermittent severe lower extremity edema, both of which wax and wane in severity.  She has been treated for chronic diastolic congestive heart failure for at least 3 or 4 years.  She has had known history of a heart murmur for the last few years and transthoracic echocardiogram performed February 2020 reportedly revealed normal left ventricular systolic function with ejection fraction estimated 60 to 65% and moderate aortic stenosis with peak velocity across aortic valve reported 3.2 m/s corresponding to mean transvalvular gradient estimated 26 mmHg and aortic valve area calculated 1.39 cm by VTI.  Patient was recently hospitalized at [facility] with severe substernal chest pain and acute exacerbation of shortness of breath.  EKG revealed sinus rhythm without  ischemic change and high-sensitivity troponin levels were minimally elevated, peaking at 248.  BNP level was elevated 251 at the time of admission.  Echocardiogram revealed normal left ventricular systolic function with ejection fraction estimated 55 to 60%.  The aortic valve was "not well visualized".  There was reported to be moderate to severe aortic stenosis with peak velocity across aortic valve measured as high as 3.66 m/s corresponding to mean transvalvular gradient estimated 31 mmHg and aortic valve area calculated 1.11 cm by VTI.  The DVI was reported 0.39 with stroke-volume index 47.  The patient was evaluated by Dr. Rockey Situ and underwent diagnostic cardiac catheterization by Dr. Fletcher Anon on March 31, 2020.  Catheterization revealed moderate nonobstructive coronary artery disease.  Mean transvalvular gradient across the aortic valve was reported 31 mmHg corresponding to aortic valve area calculated 0.7 cm.  There was moderate pulmonary hypertension.  The patient was referred to the multidisciplinary heart valve clinic and has been evaluated previously by Dr. Angelena Form.  CT angiography was performed and the patient was referred for surgical consultation.  Patient is widowed and lives with one of her daughters in [place].  The patient lives a very sedentary lifestyle.  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  She has longstanding severe chronic shortness of breath.  She has severe exertional shortness of breath and intermittent resting shortness of breath.  She wears oxygen most of the time when she is not smoking.  She ambulates very short distances with help of a cane for balance.  For longer distances she uses a wheelchair.  She has intermittent lower extremity edema which has been improved since her recent  hospitalization.  She has intermittent mild dizziness without syncope.  She denies exertional chest pain although she had episode  of chest pressure at the time of her recent hospital admission.  She has longstanding chronic productive cough.  She denies history of hemoptysis.  She states that she may have some discomfort in her right shoulder with inspiration.  She has chronic wheezing.  Past Medical History:  Diagnosis Date  . Anxiety   . Asthma   . CHF (congestive heart failure) ([place])    2018  . COPD (chronic obstructive pulmonary disease) ([place])   . Diabetes mellitus without complication ([place])   . Dyspnea   . GERD (gastroesophageal reflux disease)   . History of hiatal hernia   . History of orthopnea   . Hypothyroidism   . Neuropathy   . Oxygen deficiency    3L/HS  . Pain    BACK/DDD  . Peripheral vascular disease ([place])   . RLS (restless legs syndrome)   . Sleep apnea   . Wheezing     Past Surgical History:  Procedure Laterality Date  . BREAST SURGERY    . CATARACT EXTRACTION W/PHACO Right 03/02/2018   Procedure: CATARACT EXTRACTION PHACO AND INTRAOCULAR LENS PLACEMENT ([place]);  Surgeon: Marchia Meiers, MD;  Location: [facility] ORS;  Service: Ophthalmology;  Laterality: Right;  [place] 01:15 CDE 16.46 Fluid pack lot # [id number]  . CATARACT EXTRACTION W/PHACO Left 04/27/2018   Procedure: CATARACT EXTRACTION PHACO AND INTRAOCULAR LENS PLACEMENT ([place])- LEFT DIABETIC;  Surgeon: Marchia Meiers, MD;  Location: [facility] ORS;  Service: Ophthalmology;  Laterality: Left;  Lot # [id number] H [place]: 00:46.3 CDE: 8.07   . CYST EXCISION     FOREHEAD  . FOOT SURGERY     CYST  . RIGHT/LEFT HEART CATH AND CORONARY ANGIOGRAPHY N/A 03/31/2020   Procedure: RIGHT/LEFT HEART CATH AND CORONARY ANGIOGRAPHY;  Surgeon: Wellington Hampshire, MD;  Location: [facility];  Service: Cardiovascular;  Laterality: N/A;  . TUBAL LIGATION      Family History  Problem Relation Age of Onset  . Heart disease Mother   . Cancer Father     Social History   Socioeconomic History  . Marital status: Widowed    Spouse name: Not on file  . Number of  children: 4  . Years of education: Not on file  . Highest education level: Not on file  Occupational History  . Occupation: Health and safety inspector  Tobacco Use  . Smoking status: Current Every Day Smoker    Packs/day: 1.00    Years: 50.00    Pack years: 50.00    Types: Cigarettes  . Smokeless tobacco: Never Used  Substance and Sexual Activity  . Alcohol use: Not Currently  . Drug use: Not on file  . Sexual activity: Not on file  Other Topics Concern  . Not on file  Social History Narrative  . Not on file   Social Determinants of Health   Financial Resource Strain:   . Difficulty of Paying Living Expenses: Not on file  Food Insecurity:   . Worried About Charity fundraiser in the Last Year: Not on file  . Ran Out of Food in the Last Year: Not on file  Transportation Needs:   . Lack of Transportation (Medical): Not on file  . Lack of Transportation (Non-Medical): Not on file  Physical Activity:   . Days of Exercise per Week: Not on file  . Minutes of Exercise per Session: Not on file  Stress:   .  Feeling of Stress : Not on file  Social Connections:   . Frequency of Communication with Friends and Family: Not on file  . Frequency of Social Gatherings with Friends and Family: Not on file  . Attends Religious Services: Not on file  . Active Member of Clubs or Organizations: Not on file  . Attends Archivist Meetings: Not on file  . Marital Status: Not on file  Intimate Partner Violence:   . Fear of Current or Ex-Partner: Not on file  . Emotionally Abused: Not on file  . Physically Abused: Not on file  . Sexually Abused: Not on file    Current Outpatient Medications  Medication Sig Dispense Refill  . ACCU-CHEK GUIDE test strip     . albuterol (PROAIR HFA) 108 (90 Base) MCG/ACT inhaler Inhale 2 puffs into the lungs every 4 (four) hours as needed for wheezing or shortness of breath.     Marland Kitchen aspirin EC 81 MG tablet Take 81 mg by mouth daily.    Marland Kitchen atorvastatin  (LIPITOR) 80 MG tablet Take 80 mg by mouth at bedtime.     . carvedilol (COREG) 3.125 MG tablet Take 1 tablet (3.125 mg total) by mouth 2 (two) times daily with a meal. 60 tablet 0  . citalopram (CELEXA) 40 MG tablet Take 40 mg by mouth daily.    . diclofenac Sodium (VOLTAREN) 1 % GEL Apply 1 application topically 4 (four) times daily.     Marland Kitchen esomeprazole (NEXIUM) 40 MG capsule Take 40 mg by mouth daily.    . famotidine (PEPCID) 40 MG tablet Take 40 mg by mouth daily.    . fenofibrate 160 MG tablet Take 160 mg by mouth daily.    . ferrous sulfate 325 (65 FE) MG tablet Take 325 mg by mouth daily with breakfast.    . furosemide (LASIX) 40 MG tablet Take 1 tablet (40 mg total) by mouth daily for 3 days, THEN 1 tablet (40 mg total) daily. 30 tablet 3  . gabapentin (NEURONTIN) 600 MG tablet Take 600 mg by mouth 2 (two) times daily.     . hydrOXYzine (VISTARIL) 50 MG capsule Take 50 mg by mouth every 6 (six) hours as needed for anxiety.    Marland Kitchen levothyroxine (SYNTHROID, LEVOTHROID) 75 MCG tablet Take 75 mcg by mouth daily before breakfast.    . losartan (COZAAR) 25 MG tablet Take 1 tablet (25 mg total) by mouth daily. 30 tablet 3  . metFORMIN (GLUCOPHAGE) 500 MG tablet Take 500 mg by mouth 2 (two) times daily.    Marland Kitchen oxyCODONE-acetaminophen (PERCOCET/ROXICET) 5-325 MG tablet Take 1 tablet by mouth 3 (three) times daily.    . OXYGEN Inhale 3 L into the lungs at bedtime.     . sitaGLIPtin (JANUVIA) 100 MG tablet Take 100 mg by mouth daily.    . Tiotropium Bromide Monohydrate (SPIRIVA RESPIMAT) 2.5 MCG/ACT AERS Inhale 2 puffs into the lungs daily.      No current facility-administered medications for this visit.    Allergies  Allergen Reactions  . Altace [Ramipril] Swelling      Review of Systems:   General:  normal appetite, normal energy, no weight gain, no weight loss, no fever  Cardiac:  no chest pain with exertion, + recent episode chest pain at rest, +SOB with exertion, + resting SOB, no PND,  no orthopnea, no palpitations, no arrhythmia, no atrial fibrillation, + LE edema, mild dizzy spells, no syncope  Respiratory:  + shortness of breath, +  home oxygen, + productive cough, + dry cough, + bronchitis, + wheezing, no hemoptysis, no asthma, + pain with inspiration or cough, + sleep apnea, no CPAP at night  GI:   no difficulty swallowing, + reflux, no frequent heartburn, no hiatal hernia, no abdominal pain, no constipation, no diarrhea, no hematochezia, no hematemesis, no melena  GU:   no dysuria,  no frequency, no urinary tract infection, no hematuria, no kidney stones, no kidney disease  Vascular:  no pain suggestive of claudication, no pain in feet, no leg cramps, + varicose veins, no DVT, no non-healing foot ulcer  Neuro:   no stroke, no TIA's, no seizures, no headaches, no temporary blindness one eye,  no slurred speech, + peripheral neuropathy, no chronic pain, + instability of gait, no memory/cognitive dysfunction  Musculoskeletal: + arthritis, no joint swelling, no myalgias, + difficulty walking, limited mobility   Skin:   + rash, no itching, no skin infections, no pressure sores or ulcerations  Psych:   no anxiety, no depression, no nervousness, no unusual recent stress  Eyes:   + blurry vision, no floaters, no recent vision changes, + wears glasses or contacts  ENT:   no hearing loss, no loose or painful teeth, edentulous, no dentures  Hematologic:  no easy bruising, no abnormal bleeding, no clotting disorder, no frequent epistaxis  Endocrine:  + diabetes, does check CBG's at home           Physical Exam:   BP 135/69   Pulse 69   Temp 97.6 F (36.4 C) (Skin)   Resp 20   Ht 5\' 2"  (1.575 m)   Wt 230 lb (104.3 kg)   LMP  (LMP Unknown)   SpO2 92% Comment: 3L O2 per [place]  BMI 42.07 kg/m   General:  Extremely obese, chronically ill-appearing  HEENT:  Unremarkable   Neck:   no JVD, no bruits, no adenopathy   Chest:   Few scattered rhonchi, symmetrical breath sounds, no  wheezes,  CV:   RRR, grade II-III/VI crescendo/decrescendo murmur heard best at LLSB,  no diastolic murmur  Abdomen:  soft, non-tender, no masses   Extremities:  warm, well-perfused, pulses not palpable, trace LE edema  Rectal/GU  Deferred  Neuro:   Grossly non-focal and symmetrical throughout  Skin:   Clean and dry, no rashes, no breakdown   Diagnostic Tests:  EKG: Sinus tachycardia w/out significant AV conduction delay (03/28/2020)     ECHOCARDIOGRAM REPORT       Patient Name:  Kristin [name] Date of Exam: 03/29/2020  Medical Rec #: [id number]   Height:    62.0 in  Accession #:  [id number]  Weight:    232.2 lb  Date of Birth: 1950-12-23  BSA:     2.037 m  Patient Age:  43 years   BP:      139/64 mmHg  Patient Gender: F       HR:      56 bpm.  Exam Location: [facility]   Procedure: 2D Echo and Intracardiac Opacification Agent   Indications:   Aortic Stenosis 135.0          Chest Pain R07.9    History:     Patient has prior history of Echocardiogram examinations,  most          recent 07/05/2018.    Sonographer:   Barrera  Referring Phys: [id number] Arvil Chaco  Diagnosing Phys: Ida Rogue MD   IMPRESSIONS    1. Left  ventricular ejection fraction, by estimation, is 55 to 60%. The  left ventricle has normal function. The left ventricle has no regional  wall motion abnormalities. There is mild left ventricular hypertrophy.  Left ventricular diastolic parameters  are consistent with Grade I diastolic dysfunction (impaired relaxation).  2. Right ventricular systolic function is normal. The right ventricular  size is normal. Tricuspid regurgitation signal is inadequate for assessing  PA pressure.  3. Left atrial size was mildly dilated.  4. The aortic valve was not well visualized. Moderate to severe aortic  valve stenosis. Aortic valve area, by VTI measures 1.11 cm. Aortic  valve  mean gradient measures 31.0 mmHg. Aortic valve Vmax measures 3.66 m/s.   FINDINGS  Left Ventricle: Left ventricular ejection fraction, by estimation, is 55  to 60%. The left ventricle has normal function. The left ventricle has no  regional wall motion abnormalities. Definity contrast agent was given IV  to delineate the left ventricular  endocardial borders. The left ventricular internal cavity size was normal  in size. There is mild left ventricular hypertrophy. Left ventricular  diastolic parameters are consistent with Grade I diastolic dysfunction  (impaired relaxation).   Right Ventricle: The right ventricular size is normal. No increase in  right ventricular wall thickness. Right ventricular systolic function is  normal. Tricuspid regurgitation signal is inadequate for assessing PA  pressure.   Left Atrium: Left atrial size was mildly dilated.   Right Atrium: Right atrial size was normal in size.   Pericardium: There is no evidence of pericardial effusion.   Mitral Valve: The mitral valve is normal in structure. Moderate mitral  annular calcification. No evidence of mitral valve regurgitation. No  evidence of mitral valve stenosis.   Tricuspid Valve: The tricuspid valve is normal in structure. Tricuspid  valve regurgitation is not demonstrated. No evidence of tricuspid  stenosis.   Aortic Valve: The aortic valve was not well visualized. Aortic valve  regurgitation is not visualized. Moderate to severe aortic stenosis is  present. Aortic valve mean gradient measures 31.0 mmHg. Aortic valve peak  gradient measures 53.6 mmHg. Aortic  valve area, by VTI measures 1.11 cm.   Pulmonic Valve: The pulmonic valve was normal in structure. Pulmonic valve  regurgitation is not visualized. No evidence of pulmonic stenosis.   Aorta: The aortic root is normal in size and structure.   Venous: The inferior vena cava is normal in size with greater than 50%  respiratory  variability, suggesting right atrial pressure of 3 mmHg.   IAS/Shunts: No atrial level shunt detected by color flow Doppler.     LEFT VENTRICLE  PLAX 2D  LVIDd:     4.87 cm Diastology  LVIDs:     3.40 cm LV e' medial:  4.13 cm/s  LV PW:     1.22 cm LV E/e' medial: 23.6  LV IVS:    1.32 cm LV e' lateral:  7.62 cm/s  LVOT diam:   1.90 cm LV E/e' lateral: 12.8  LV SV:     95  LV SV Index:  47  LVOT Area:   2.84 cm     LEFT ATRIUM      Index  LA diam:   4.70 cm 2.31 cm/m  LA Vol (A4C): 23.7 ml 11.64 ml/m  AORTIC VALVE  AV Area (Vmax):  0.98 cm  AV Area (Vmean):  0.99 cm  AV Area (VTI):   1.11 cm  AV Vmax:      366.00 cm/s  AV Vmean:  264.000 cm/s  AV VTI:      0.858 m  AV Peak Grad:   53.6 mmHg  AV Mean Grad:   31.0 mmHg  LVOT Vmax:     126.00 cm/s  LVOT Vmean:    92.100 cm/s  LVOT VTI:     0.335 m  LVOT/AV VTI ratio: 0.39    AORTA  Ao Root diam: 2.80 cm   MITRAL VALVE  MV Area (PHT): 4.54 cm   SHUNTS  MV Decel Time: 167 msec   Systemic VTI: 0.34 m  MV E velocity: 97.50 cm/s  Systemic Diam: 1.90 cm  MV A velocity: 105.00 cm/s  MV E/A ratio: 0.93   Ida Rogue MD  Electronically signed by Ida Rogue MD  Signature Date/Time: 03/29/2020/4:45:18 PM      RIGHT/LEFT HEART CATH AND CORONARY ANGIOGRAPHY  Conclusion    Prox RCA lesion is 30% stenosed.  Mid RCA lesion is 40% stenosed.  RPDA lesion is 30% stenosed.  Prox Cx lesion is 60% stenosed.  Mid LAD lesion is 20% stenosed.  Mid Cx to Dist Cx lesion is 20% stenosed.   1.  Mild to moderate nonobstructive coronary artery disease.  Worst stenosis is 60% in the proximal left circumflex.  No evidence of obstructive disease.  Coronary arteries are overall moderately calcified. 2.  Right heart catheterization showed moderately elevated left-sided filling pressures, moderate pulmonary hypertension and  moderately reduced cardiac output. 3.  Severe aortic stenosis with mean gradient of 31 mmHg and calculated valve area of 0.7 cm.  Recommendations: The patient is significantly volume overloaded.  I switched furosemide to intravenous 20 mg twice daily.  I increased carvedilol for better blood pressure control. Recommend medical therapy for nonobstructive coronary artery disease. Recommend outpatient TAVR evaluation.  Indications  Nonrheumatic aortic valve stenosis [I35.0 (ICD-10-CM)]  Elevated troponin [R77.8 (ICD-10-CM)]  Procedural Details  Technical Details Procedural Details: The pre-existing IV in the right antecubital vein was exchanged under sterile fashion to a slender sheath. The right wrist was prepped, draped, and anesthetized with 1% lidocaine. Using the modified Seldinger technique, a 5 French sheath was introduced into the right radial artery. 2.5 mg of verapamil was administered through the sheath, weight-based unfractionated heparin was administered intravenously. Right heart catheterization was performed using a 5 French Swan-Ganz catheter. Cardiac output was calculated by the Fick method. A Jackie catheter was used for selective coronary angiography.  It was also used to cross the aortic valve, record LV pressure with pullback.  Left ventricular angiography was not performed.   There were no immediate procedural complications. A TR band was used for radial hemostasis at the completion of the procedure.  The patient was transferred to the post catheterization recovery area for further monitoring.  Estimated blood loss <50 mL.   During this procedure medications were administered to achieve and maintain moderate conscious sedation while the patient's heart rate, blood pressure, and oxygen saturation were continuously monitored and I was present face-to-face 100% of this time.  Medications (Filter: Administrations occurring from 1439 to 1533 on 03/31/20) (important) Continuous  medications are totaled by the amount administered until 03/31/20 1533.  midazolam (VERSED) injection (mg) Total dose:  2 mg Date/Time  Rate/Dose/Volume Action  03/31/20 1449  1 mg Given  1505  1 mg Given    fentaNYL (SUBLIMAZE) injection (mcg) Total dose:  50 mcg Date/Time  Rate/Dose/Volume Action  03/31/20 1449  25 mcg Given  1505  25 mcg Given    lidocaine (PF) (XYLOCAINE) 1 %  injection (mL) Total volume:  5 mL Date/Time  Rate/Dose/Volume Action  03/31/20 1457  5 mL Given    verapamil (ISOPTIN) injection (mg) Total dose:  2.5 mg Date/Time  Rate/Dose/Volume Action  03/31/20 1506  2.5 mg Given    heparin sodium (porcine) injection (Units) Total dose:  5,000 Units Date/Time  Rate/Dose/Volume Action  03/31/20 1513  5,000 Units Given    Heparin (Porcine) in NaCl 1000-0.9 UT/500ML-% SOLN (mL) Total volume:  500 mL Date/Time  Rate/Dose/Volume Action  03/31/20 1518  500 mL Given    iohexol (OMNIPAQUE) 300 MG/ML solution (mL) Total volume:  70 mL Date/Time  Rate/Dose/Volume Action  03/31/20 1531  70 mL Given    acetaminophen (TYLENOL) tablet 650 mg (mg) Total dose:  Cannot be calculated* Dosing weight:  104.3 *Administration dose not documented Date/Time  Rate/Dose/Volume Action  03/31/20 1456  *Not included in total MAR Hold    albuterol (VENTOLIN HFA) 108 (90 Base) MCG/ACT inhaler 2 puff (puff) Total dose:  Cannot be calculated* *Administration dose not documented Date/Time  Rate/Dose/Volume Action  03/31/20 1456  *Not included in total MAR Hold    alum & mag hydroxide-simeth (MAALOX/MYLANTA) 200-200-20 MG/5ML suspension 30 mL (mL) Total dose:  Cannot be calculated* Dosing weight:  105.3 *Administration dose not documented Date/Time  Rate/Dose/Volume Action  03/31/20 1456  *Not included in total MAR Hold    aspirin EC tablet 81 mg (mg) Total dose:  Cannot be calculated* Dosing weight:  104.3 *Administration dose not documented Date/Time  Rate/Dose/Volume Action   03/31/20 1456  *Not included in total MAR Hold    atorvastatin (LIPITOR) tablet 80 mg (mg) Total dose:  Cannot be calculated* *Administration dose not documented Date/Time  Rate/Dose/Volume Action  03/31/20 1456  *Not included in total MAR Hold    carvedilol (COREG) tablet 6.25 mg (mg) Total dose:  Cannot be calculated* Dosing weight:  104.3 *Administration dose not documented Date/Time  Rate/Dose/Volume Action  03/31/20 1456  *Not included in total MAR Hold    citalopram (CELEXA) tablet 40 mg (mg) Total dose:  Cannot be calculated* Dosing weight:  107.2 *Administration dose not documented Date/Time  Rate/Dose/Volume Action  03/31/20 1445  *40 mg Not Given  1456  *Not included in total MAR Hold    fenofibrate tablet 160 mg (mg) Total dose:  Cannot be calculated* Dosing weight:  107.2 *Administration dose not documented Date/Time  Rate/Dose/Volume Action  03/31/20 1445  *160 mg Not Given  1456  *Not included in total MAR Hold    furosemide (LASIX) tablet 40 mg (mg) Total dose:  Cannot be calculated* Dosing weight:  107.2 *Administration dose not documented Date/Time  Rate/Dose/Volume Action  03/31/20 1445  *40 mg Not Given  1456  *Not included in total MAR Hold    gabapentin (NEURONTIN) tablet 600 mg (mg) Total dose:  Cannot be calculated* Dosing weight:  107.2 *Administration dose not documented Date/Time  Rate/Dose/Volume Action  03/31/20 1445  *600 mg Not Given  1456  *Not included in total MAR Hold    hydrOXYzine (VISTARIL) capsule 50 mg (mg) Total dose:  Cannot be calculated* Dosing weight:  107.2 *Administration dose not documented Date/Time  Rate/Dose/Volume Action  03/31/20 1456  *Not included in total MAR Hold    insulin aspart (novoLOG) injection 0-20 Units (Units) Total dose:  Cannot be calculated* Dosing weight:  104.3 *Administration dose not documented Date/Time  Rate/Dose/Volume Action  03/31/20 1456  *Not included in total MAR Hold    insulin  aspart (novoLOG) injection  0-5 Units (Units) Total dose:  Cannot be calculated* Dosing weight:  104.3 *Administration dose not documented Date/Time  Rate/Dose/Volume Action  03/31/20 1456  *Not included in total MAR Hold    ipratropium-albuterol (DUONEB) 0.5-2.5 (3) MG/3ML nebulizer solution 3 mL (mL) Total dose:  Cannot be calculated* Dosing weight:  104.3 *Administration dose not documented Date/Time  Rate/Dose/Volume Action  03/31/20 1456  *Not included in total MAR Hold    levothyroxine (SYNTHROID) tablet 75 mcg (mcg) Total dose:  Cannot be calculated* Dosing weight:  104.3 *Administration dose not documented Date/Time  Rate/Dose/Volume Action  03/31/20 1456  *Not included in total MAR Hold    losartan (COZAAR) tablet 25 mg (mg) Total dose:  Cannot be calculated* Dosing weight:  104.3 *Administration dose not documented Date/Time  Rate/Dose/Volume Action  03/31/20 1456  *Not included in total MAR Hold    melatonin tablet 5 mg (mg) Total dose:  Cannot be calculated* Dosing weight:  105.5 *Administration dose not documented Date/Time  Rate/Dose/Volume Action  03/31/20 1456  *Not included in total MAR Hold    nitroGLYCERIN (NITROSTAT) SL tablet 0.4 mg (mg) Total dose:  Cannot be calculated* Dosing weight:  104.3 *Administration dose not documented Date/Time  Rate/Dose/Volume Action  03/31/20 1456  *Not included in total MAR Hold    ondansetron (ZOFRAN) injection 4 mg (mg) Total dose:  Cannot be calculated* Dosing weight:  104.3 *Administration dose not documented Date/Time  Rate/Dose/Volume Action  03/31/20 1456  *Not included in total MAR Hold    pantoprazole (PROTONIX) EC tablet 40 mg (mg) Total dose:  Cannot be calculated* Dosing weight:  107.2 *Administration dose not documented Date/Time  Rate/Dose/Volume Action  03/31/20 1456  *Not included in total MAR Hold    polyethylene glycol (MIRALAX / GLYCOLAX) packet 17 g (g) Total dose:  Cannot be calculated* Dosing  weight:  105.5 *Administration dose not documented Date/Time  Rate/Dose/Volume Action  03/31/20 1456  *Not included in total MAR Hold    predniSONE (DELTASONE) tablet 40 mg (mg) Total dose:  Cannot be calculated* Dosing weight:  104.3 *Administration dose not documented Date/Time  Rate/Dose/Volume Action  03/31/20 1456  *Not included in total MAR Hold    sodium chloride flush (NS) 0.9 % injection 3 mL (mL) Total dose:  Cannot be calculated* Dosing weight:  105.3 *Administration dose not documented Date/Time  Rate/Dose/Volume Action  03/31/20 1456  *Not included in total MAR Hold    tiotropium (SPIRIVA) inhalation capsule ([facility] use ONLY) 18 mcg (mcg) Total dose:  Cannot be calculated* Dosing weight:  107.2 *Administration dose not documented Date/Time  Rate/Dose/Volume Action  03/31/20 1445  *18 mcg Not Given  1456  *Not included in total MAR Hold    Sedation Time  Sedation Time Physician-1: 29 minutes 36 seconds  Contrast  Medication Name Total Dose  iohexol (OMNIPAQUE) 300 MG/ML solution 70 mL    Radiation/Fluoro  Fluoro time: 3.4 (min) DAP: 41.2 (Gycm2) Cumulative Air Kerma: 587 (mGy)  Coronary Findings  Diagnostic Dominance: Right Left Main  Vessel is angiographically normal.  Left Anterior Descending  Mid LAD lesion is 20% stenosed. The lesion is mildly calcified.  Left Circumflex  Prox Cx lesion is 60% stenosed. The lesion is moderately calcified.  Mid Cx to Dist Cx lesion is 20% stenosed.  Second Obtuse Marginal Branch  Vessel is angiographically normal.  Third Obtuse Marginal Branch  Vessel is angiographically normal.  Right Coronary Artery  Prox RCA lesion is 30% stenosed. The lesion is mildly calcified.  Mid  RCA lesion is 40% stenosed. The lesion is mildly calcified.  Right Posterior Descending Artery  RPDA lesion is 30% stenosed.  Intervention  No interventions have been documented. Coronary Diagrams  Diagnostic Dominance:  Right  Intervention  Implants   No implant documentation for this case.  Syngo Images  Show images for CARDIAC CATHETERIZATION Images on [employer] Term Storage  Show images for Sow, Charde A Link to Procedure Log  Procedure Log    Hemo Data (last 6 days) before discharge   AO Systolic Cath Pressure  AO Diastolic Cath Pressure  AO Mean Cath Pressure  LV Systolic Cath Pressure  LV End Diastolic  LV Systolic  LV End Diastolic  LV dP/dt  PA Systolic Cath Pressure  PA Diastolic Cath Pressure  PA Mean Cath Pressure  RA Wedge A Wave  RA Wedge V Wave  RV Systolic Cath Pressure  RV Diastolic Cath Pressure  RV End Diastolic  RV Systolic  RV End Diastolic  RV dP/dt  PCW A Wave  PCW V Wave  PCW Mean  AO O2 Sat  PA O2 Sat  AO O2 Sat  Fick C.O.  Fick C.I.   --  --  --  --  --  202 mmHg  42 mmHg  1296 mmHg/sec  --  --  --  14 mmHg  9 mmHg  --  --  --  44 mmHg  9 mmHg  384 mmHg/sec  33 mmHg  35 mmHg  27 mmHg  --  --  --  4.19 L/min  2.05 L/min/m2   --  --  --  --  --  --  --  --  --  --  --  --  --  44 mmHg  5 mmHg  9 mmHg  --  --  --  --  --  --  --  --  --  --  --   --  --  --  --  --  --  --  --  47 mmHg  27 mmHg  35 mmHg  --  --  --  --  --  --  --  --  --  --  --  --  --  --  --  --   --  --  --  207 mmHg  31 mmHg  --  --  --  --  --  --  --  --  --  --  --  --  --  --  --  --  --  --  --  --  --  --   --  --  --  218 mmHg  32 mmHg  --  --  --  --  --  --  --  --  --  --  --  --  --  --  --  --  --  --  --  --  --  --   --  --  --  202 mmHg  42 mmHg  --  --  --  --  --  --  --  --  --  --  --  --  --  --  --  --  --  --  --  --  --  --   175  76 mmHg  115 mmHg  --  --  --  --  --  --  --  --  --  --  --  --  --  --  --  --  --  --  --  --  --  --  --  --   --  --  --  --  --  --  --  --  --  --  --  --  --  --  --  --  --  --  --  --  --  --  95.9 %  --  SA  --  --   --  --  --  --  --  --  --                                             Cardiac TAVR CT  TECHNIQUE: The patient was scanned on a  [employer]. A 120 kV retrospective scan was triggered in the descending thoracic aorta at 111 HU's. Gantry rotation speed was 250 msecs and collimation was .6 mm. No beta blockade or nitro were given. The 3D data set was reconstructed in 5% intervals of the R-R cycle. Systolic and diastolic phases were analyzed on a dedicated work station using MPR, MIP and VRT modes. The patient received 80 cc of contrast.  FINDINGS: Aortic Valve: Trileaflet aortic valve with moderately calcified leaflets and moderately restricted leaflet opening. Aortic valve calcium score 1257.  Aorta: Normal size with mild diffuse atherosclerotic plaque and calcifications and no dissection.  Sinotubular Junction: 29 x 27 mm  Ascending Thoracic Aorta: 33 x 31 mm  Aortic Arch: 29 x 26 mm  Descending Thoracic Aorta: 25 x 23 mm  Sinus of Valsalva Measurements:  Non-coronary: 28 mm  Right -coronary: 30 mm  Left -coronary: 27 mm  Coronary Artery Height above Annulus:  Left Main: 12 mm  Right Coronary: 14 mm  Virtual Basal Annulus Measurements:  Maximum/Minimum Diameter: 27.1 x 20.9 mm  Mean Diameter: 23.8 mm  Perimeter: 76.3 mm  Area: 444 mm2  Optimum Fluoroscopic Angle for Delivery: RAO 2 CRA 3  IMPRESSION: 1. Trileaflet aortic valve with moderately calcified leaflets and moderately restricted leaflet opening. Aortic valve calcium score 1257. (Severe if > 2000 in males). Annular measurements suitable for delivery of a 26 mm Edwards-SAPIEN 3 Ultra valve.  2. Sufficient coronary to annulus distance.  3. Optimum Fluoroscopic Angle for Delivery: RAO 2 CRA 3.  4. No thrombus in the left atrial appendage.  Electronically Signed: By: Ena Dawley On: 04/08/2020 22:35    CT ANGIOGRAPHY CHEST, ABDOMEN AND PELVIS  TECHNIQUE: Multidetector CT imaging through the chest, abdomen and pelvis was performed using the standard protocol during bolus  administration of intravenous contrast. Multiplanar reconstructed images and MIPs were obtained and reviewed to evaluate the vascular anatomy.  CONTRAST:  159mL OMNIPAQUE IOHEXOL 350 MG/ML SOLN  COMPARISON:  No priors.  FINDINGS: CTA CHEST FINDINGS  Cardiovascular: Heart size is mildly enlarged. There is no significant pericardial fluid, thickening or pericardial calcification. There is aortic atherosclerosis, as well as atherosclerosis of the great vessels of the mediastinum and the coronary arteries, including calcified atherosclerotic plaque in the left main, left anterior descending, left circumflex and right coronary arteries. Severe thickening calcification of the aortic valve. Severe calcifications of the mitral annulus.  Mediastinum/Lymph Nodes: No pathologically enlarged mediastinal or hilar lymph nodes. Esophagus is unremarkable in appearance. No axillary lymphadenopathy.  Lungs/Pleura: In the superior segment of the right lower lobe (axial image 45 of series 8 and sagittal image 88 of series 10) there is a 1.9 x 1.4 x 1.6 cm macrolobulated nodule which makes contact with the superior aspect of the right major fissure, concerning for potential primary bronchogenic neoplasm. No other suspicious appearing pulmonary nodules or masses are noted. No acute consolidative airspace disease. No pleural effusions.  Musculoskeletal/Soft Tissues: There are no aggressive appearing lytic or blastic lesions noted  in the visualized portions of the skeleton.  CTA ABDOMEN AND PELVIS FINDINGS  Hepatobiliary: Diffuse low attenuation throughout the hepatic parenchyma, indicative of a background of hepatic steatosis. No discrete cystic or solid hepatic lesions. No intra or extrahepatic biliary ductal dilatation. Small calcified gallstones lying dependently in the gallbladder. No findings to suggest an acute cholecystitis at this time.  Pancreas: No pancreatic mass. No  pancreatic ductal dilatation. No pancreatic or peripancreatic fluid collections or inflammatory changes.  Spleen: Unremarkable.  Adrenals/Urinary Tract: Exophytic 3.8 cm high attenuation lesion in the upper pole of the left kidney is incompletely characterized on today's examination, but is similar in size to prior study from 02/08/2016, presumably a chronic proteinaceous/hemorrhagic cyst. Several other subcentimeter low-attenuation lesions are noted in both kidneys, too small to definitively characterize, but statistically likely to represent tiny cysts. Bilateral adrenal glands are normal in appearance. No hydroureteronephrosis. Urinary bladder is normal in appearance.  Stomach/Bowel: Normal appearance of the stomach. No pathologic dilatation of small bowel or colon. Several colonic diverticulae are noted, particularly in the sigmoid colon, without surrounding inflammatory changes to suggest an acute diverticulitis at this time. Normal appendix.  Vascular/Lymphatic: Aortic atherosclerosis, with vascular findings and measurements pertinent to potential TAVR procedure, as detailed below. No aneurysm or dissection noted in the abdominal or pelvic vasculature. No lymphadenopathy noted in the abdomen or pelvis.  Reproductive: Uterus and ovaries are a trophic.  Other: No significant volume of ascites.  No pneumoperitoneum.  Musculoskeletal: There are no aggressive appearing lytic or blastic lesions noted in the visualized portions of the skeleton.  VASCULAR MEASUREMENTS PERTINENT TO TAVR:  AORTA:  Minimal Aortic Diameter-13 x 12 mm  Severity of Aortic Calcification-mild-to-moderate  RIGHT PELVIS:  Right Common Iliac Artery -  Minimal Diameter-7.8 x 7.2 mm  Tortuosity-mild  Calcification-moderate  Right External Iliac Artery -  Minimal Diameter-6.2 x 5.2 mm  Tortuosity-mild to moderate  Calcification-mild  Right Common Femoral Artery  -  Minimal Diameter-6.1 x 6.0 mm  Tortuosity-mild  Calcification-mild  LEFT PELVIS:  Left Common Iliac Artery -  Minimal Diameter-6.4 x 6.4 mm  Tortuosity-mild  Calcification-moderate  Left External Iliac Artery -  Minimal Diameter-6.4 x 5.2 mm  Tortuosity-mild  Calcification-none  Left Common Femoral Artery -  Minimal Diameter-6.2 x 5.1 mm  Tortuosity-mild  Calcification-mild  Review of the MIP images confirms the above findings.  IMPRESSION: 1. Vascular findings and measurements pertinent to potential TAVR procedure, as detailed above. 2. Severe thickening calcification of the aortic valve, compatible with reported clinical history of severe aortic stenosis. 3. 1.9 x 1.4 x 1.6 cm macrolobulated nodule in the superior segment of the right lower lobe, highly concerning for primary bronchogenic neoplasm. Further evaluation with PET-CT is recommended in the near future to better evaluate this finding and assess for potential metastatic disease. No definite metastatic disease is identified in the chest, abdomen or pelvis on today's examination. 4. Aortic atherosclerosis, in addition to left main and 3 vessel coronary artery disease. 5. Severe calcifications of the mitral annulus. 6. Severe hepatic steatosis. 7. Colonic diverticulosis without evidence of acute diverticulitis at this time. 8. Additional incidental findings, as above.  These results will be called to the ordering clinician or representative by the Radiologist Assistant, and communication documented in the PACS or [employer].   Electronically Signed   By: Vinnie Langton M.D.   On: 04/14/2020 05:23      Impression:  Patient has at least stage B progressive and possibly early stage D severe  symptomatic aortic stenosis.  She has a long history of chronic hypoxemic respiratory failure with longstanding symptoms of exertional shortness of breath that are clearly  multifactorial and related to a combination of severe chronic lung disease, obstructive sleep apnea, and chronic diastolic congestive heart failure.  She has had symptoms of chronic diastolic congestive heart failure for the last several years and was recently hospitalized with acute exacerbation of chest discomfort and resting shortness of breath during which time high-sensitivity troponin levels were minimally elevated and BNP level was mildly elevated.  Symptoms improved with diuretic therapy and the patient seems to be back to her previous chronic baseline with severely restricted functional capacity due to severe exertional shortness of breath likely consistent with chronic diastolic congestive heart failure, New York Heart Association functional class IIIb.  I have personally reviewed the patient's recent EKG, transthoracic echocardiogram, diagnostic cardiac catheterization, and CT angiograms.  EKG at the time of her recent hospital admission revealed sinus tachycardia without acute ischemic changes.  Transthoracic echocardiogram revealed normal left ventricular systolic function.  Acoustic windows were very poor and the aortic valve was not visualized well at all.  Peak velocity across the aortic valve measured as high as 3.66 m/s corresponding to mean transvalvular gradient estimated 31 mmHg and aortic valve area calculated 1.11 cm by VTI.  The DVI was reported 0.39 with stroke-volume index 47.  Diagnostic cardiac catheterization was notable for the absence of significant coronary artery disease.  Mean transvalvular gradient across the aortic valve was measured 31 mmHg.  Pulmonary artery pressures were moderately elevated.  Gated CT angiogram of the heart revealed a trileaflet aortic valve with moderate thickening and calcification with aortic valve calcium score 1257.  In my opinion there was only moderately restricted leaflet opening.  CT angiography of the aorta and iliac vessels reveal what appears to  be adequate pelvic vascular access to allow transcatheter aortic valve replacement via transfemoral approach.  CT angiography also revealed a 1.9 x 1.4 x 1.6 cm mass in the superior segment of the right lower lobe suspicious for primary lung cancer.  No other suspicious pulmonary nodules or lesions were noted and there was no sign of significant mediastinal or hilar lymphadenopathy.  I agree that the patient might benefit from aortic valve replacement although there is no question that her longstanding symptoms of shortness of breath and lower extremity edema are multifactorial.  However, I would not consider this patient a candidate for conventional surgery.  Moreover, the patient's newly discovered lung mass needs to be further evaluated for long-term treatment plan can be determined.   Plan:  The patient and one of her daughters were counseled at length regarding treatment alternatives for management of severe symptomatic aortic stenosis. Alternative approaches such as conventional aortic valve replacement, transcatheter aortic valve replacement, and continued medical therapy without intervention were compared and contrasted at length.  The risks associated with conventional surgical aortic valve replacement were discussed in detail, as were expectations for post-operative convalescence, and why I would be reluctant to consider this patient a candidate for conventional surgery.  Issues specific to transcatheter aortic valve replacement were discussed including questions about long term valve durability, the potential for paravalvular leak, possible increased risk of need for permanent pacemaker placement, and other technical complications related to the procedure itself.  Long-term prognosis with medical therapy was discussed. This discussion was placed in the context of the patient's own specific clinical presentation and past medical history.  We also discussed the new finding  of the lung mass identified  on recent CT imaging and concerns that this may represent the presence of primary lung cancer.  All of their questions have been addressed.    As the next step the patient will undergo PET/CT imaging to further evaluate her newly discovered right lung mass.  We will also obtain formal pulmonary function tests with and without bronchodilator therapy and with an arterial blood gas on room air.  Once these results have been completed the patient may need referral to the thoracic oncology clinic.  Meanwhile, the patient's multiple diagnostic tests will be reviewed by our multidisciplinary heart valve team to discuss whether or not transesophageal echocardiogram might be helpful to further evaluate the severity of the patient's aortic stenosis.     I spent in excess of 90 minutes during the conduct of this office consultation and >50% of this time involved direct face-to-face encounter with the patient for counseling and/or coordination of their care.     Valentina Gu. Roxy Manns, MD 04/14/2020 2:30 PM

## 2020-04-14 NOTE — Telephone Encounter (Signed)
Lungs/Pleura: In the superior segment of the right lower lobe (axial image 45 of series 8 and sagittal image 88 of series 10) there is a 1.9 x 1.4 x 1.6 cm macrolobulated nodule which makes contact with the superior aspect of the right major fissure, concerning for potential primary bronchogenic neoplasm. No other suspicious appearing pulmonary nodules or masses are noted. No acute consolidative airspace disease. No pleural effusions  Radiologist recommend pet scan for further evaluation.

## 2020-04-14 NOTE — Telephone Encounter (Signed)
  Radiology report from CTA chest, abdomen and pelvis

## 2020-04-14 NOTE — Telephone Encounter (Signed)
Will forward to Dr Angelena Form for review .Adonis Housekeeper

## 2020-04-15 ENCOUNTER — Other Ambulatory Visit: Payer: Self-pay | Admitting: Thoracic Surgery (Cardiothoracic Vascular Surgery)

## 2020-04-29 ENCOUNTER — Ambulatory Visit: Payer: Medicare HMO

## 2020-04-29 ENCOUNTER — Other Ambulatory Visit: Admission: RE | Admit: 2020-04-29 | Payer: Medicare HMO | Source: Ambulatory Visit

## 2020-04-30 ENCOUNTER — Ambulatory Visit: Payer: Medicare HMO

## 2020-05-13 ENCOUNTER — Ambulatory Visit
Admission: RE | Admit: 2020-05-13 | Discharge: 2020-05-13 | Disposition: A | Discharge status: Home / Self Care | Payer: Medicare HMO | Source: Ambulatory Visit | Attending: Thoracic Surgery (Cardiothoracic Vascular Surgery) | Admitting: Thoracic Surgery (Cardiothoracic Vascular Surgery)

## 2020-05-13 ENCOUNTER — Other Ambulatory Visit: Payer: Self-pay

## 2020-05-13 DIAGNOSIS — K8020 Calculus of gallbladder without cholecystitis without obstruction: Secondary | ICD-10-CM | POA: Insufficient documentation

## 2020-05-13 DIAGNOSIS — K760 Fatty (change of) liver, not elsewhere classified: Secondary | ICD-10-CM | POA: Insufficient documentation

## 2020-05-13 DIAGNOSIS — I700 Atherosclerosis of aorta: Secondary | ICD-10-CM | POA: Diagnosis not present

## 2020-05-13 DIAGNOSIS — R911 Solitary pulmonary nodule: Secondary | ICD-10-CM | POA: Diagnosis not present

## 2020-05-13 DIAGNOSIS — I2510 Atherosclerotic heart disease of native coronary artery without angina pectoris: Secondary | ICD-10-CM | POA: Diagnosis not present

## 2020-05-13 DIAGNOSIS — I350 Nonrheumatic aortic (valve) stenosis: Secondary | ICD-10-CM | POA: Insufficient documentation

## 2020-05-13 DIAGNOSIS — E041 Nontoxic single thyroid nodule: Secondary | ICD-10-CM | POA: Insufficient documentation

## 2020-05-13 LAB — GLUCOSE, CAPILLARY: Glucose-Capillary: 104 mg/dL — ABNORMAL HIGH (ref 70–99)

## 2020-05-13 MED ORDER — FLUDEOXYGLUCOSE F - 18 (FDG) INJECTION
11.9000 | Freq: Once | INTRAVENOUS | Status: AC | PRN
  Administered 2020-05-13: 11.94 via INTRAVENOUS

## 2020-05-14 ENCOUNTER — Other Ambulatory Visit: Payer: Self-pay | Admitting: Physician Assistant

## 2020-05-14 DIAGNOSIS — C3490 Malignant neoplasm of unspecified part of unspecified bronchus or lung: Secondary | ICD-10-CM

## 2020-05-14 NOTE — Progress Notes (Signed)
brain

## 2020-05-15 ENCOUNTER — Encounter (HOSPITAL_COMMUNITY): Payer: Self-pay | Admitting: Radiology

## 2020-05-15 ENCOUNTER — Other Ambulatory Visit: Payer: Self-pay | Admitting: *Deleted

## 2020-05-15 DIAGNOSIS — I350 Nonrheumatic aortic (valve) stenosis: Secondary | ICD-10-CM

## 2020-05-15 NOTE — Progress Notes (Signed)
Kristin [name] Female, 69 y.o., 1951-01-27  MRN:  [id number] Phone:  [phone] Jerilynn Mages)       PCP:  Lorelee Market, MD Primary Cvg:  [employer] Hmo  Next Appt With Radiology ([facility]-MR 2) 05/26/2020 at 10:00 AM           RE: CT Biopsy Received: Today Suttle, Rosanne Ashing, MD  Garth Bigness D  Approved for CT guided biopsy of right lower lobe lung mass.   Dylan        Previous Messages   ----- Message -----  From: Garth Bigness D  Sent: 05/15/2020 11:43 AM EST  To: Ir Procedure Requests  Subject: CT Biopsy                     Procedure: CT Biopsy   Reason: Nonrheumatic aortic valve stenosis, lung mass PET 05/13/2020   History: CT, NM PET in computer   Provider: Rexene Alberts   Provider Contact: [phone]

## 2020-05-19 ENCOUNTER — Other Ambulatory Visit: Payer: Self-pay | Admitting: Physician Assistant

## 2020-05-19 DIAGNOSIS — C3490 Malignant neoplasm of unspecified part of unspecified bronchus or lung: Secondary | ICD-10-CM

## 2020-05-21 ENCOUNTER — Encounter: Payer: Self-pay | Admitting: *Deleted

## 2020-05-21 NOTE — Progress Notes (Signed)
I received referral on Kristin [name].  I called and scheduled her but was unable to reach. I did leave vm message with my name and phone number to call.

## 2020-05-23 ENCOUNTER — Telehealth: Payer: Self-pay | Admitting: *Deleted

## 2020-05-23 ENCOUNTER — Other Ambulatory Visit
Admission: RE | Admit: 2020-05-23 | Discharge: 2020-05-23 | Disposition: A | Discharge status: Home / Self Care | Payer: 59 | Source: Ambulatory Visit | Attending: Physician Assistant | Admitting: Physician Assistant

## 2020-05-23 ENCOUNTER — Encounter: Payer: Self-pay | Admitting: *Deleted

## 2020-05-23 ENCOUNTER — Other Ambulatory Visit
Admission: RE | Admit: 2020-05-23 | Discharge: 2020-05-23 | Disposition: A | Discharge status: Home / Self Care | Payer: 59 | Source: Ambulatory Visit | Attending: Thoracic Surgery (Cardiothoracic Vascular Surgery) | Admitting: Thoracic Surgery (Cardiothoracic Vascular Surgery)

## 2020-05-23 DIAGNOSIS — R911 Solitary pulmonary nodule: Secondary | ICD-10-CM

## 2020-05-23 DIAGNOSIS — C3490 Malignant neoplasm of unspecified part of unspecified bronchus or lung: Secondary | ICD-10-CM

## 2020-05-23 DIAGNOSIS — Z01818 Encounter for other preprocedural examination: Secondary | ICD-10-CM | POA: Insufficient documentation

## 2020-05-23 DIAGNOSIS — Z20822 Contact with and (suspected) exposure to covid-19: Secondary | ICD-10-CM | POA: Diagnosis not present

## 2020-05-23 LAB — BASIC METABOLIC PANEL
Anion gap: 7 (ref 5–15)
BUN: 12 mg/dL (ref 8–23)
CO2: 34 mmol/L — ABNORMAL HIGH (ref 22–32)
Calcium: 8.5 mg/dL — ABNORMAL LOW (ref 8.9–10.3)
Chloride: 100 mmol/L (ref 98–111)
Creatinine, Ser: 0.79 mg/dL (ref 0.44–1.00)
GFR, Estimated: >60 mL/min (ref >60)
Glucose, Bld: 136 mg/dL — ABNORMAL HIGH (ref 70–99)
Potassium: 4.5 mmol/L (ref 3.5–5.1)
Sodium: 141 mmol/L (ref 135–145)

## 2020-05-23 NOTE — Progress Notes (Signed)
I received referral on Kristin [name] from Dr. Roxy Manns.  I called and spoke with patient. She states she would like to see radiation but not "the chemo doctor".  I listened as she explained. I updated referral and will notify rad onc of referral.

## 2020-05-23 NOTE — Telephone Encounter (Signed)
I received a message from Ms. Brue.  I called her back but was unable to reach.  I did leave vm message with my name and phone number to call.

## 2020-05-24 LAB — SARS CORONAVIRUS 2 (TAT 6-24 HRS): SARS Coronavirus 2: NEGATIVE

## 2020-05-26 ENCOUNTER — Other Ambulatory Visit: Payer: Self-pay

## 2020-05-26 ENCOUNTER — Emergency Department: Payer: 59

## 2020-05-26 ENCOUNTER — Ambulatory Visit
Admission: RE | Admit: 2020-05-26 | Discharge: 2020-05-26 | Disposition: A | Discharge status: Home / Self Care | Payer: 59 | Source: Ambulatory Visit | Attending: Physician Assistant | Admitting: Physician Assistant

## 2020-05-26 ENCOUNTER — Encounter: Payer: Self-pay | Admitting: Emergency Medicine

## 2020-05-26 ENCOUNTER — Observation Stay: Payer: 59

## 2020-05-26 ENCOUNTER — Ambulatory Visit: Payer: 59

## 2020-05-26 ENCOUNTER — Observation Stay
Admission: EM | Admit: 2020-05-26 | Discharge: 2020-05-27 | Disposition: A | Discharge status: Home / Self Care | Payer: 59 | Attending: Internal Medicine | Admitting: Internal Medicine

## 2020-05-26 DIAGNOSIS — E119 Type 2 diabetes mellitus without complications: Secondary | ICD-10-CM | POA: Diagnosis not present

## 2020-05-26 DIAGNOSIS — Z9861 Coronary angioplasty status: Secondary | ICD-10-CM | POA: Insufficient documentation

## 2020-05-26 DIAGNOSIS — Z20822 Contact with and (suspected) exposure to covid-19: Secondary | ICD-10-CM | POA: Insufficient documentation

## 2020-05-26 DIAGNOSIS — F17210 Nicotine dependence, cigarettes, uncomplicated: Secondary | ICD-10-CM | POA: Insufficient documentation

## 2020-05-26 DIAGNOSIS — Z720 Tobacco use: Secondary | ICD-10-CM | POA: Diagnosis present

## 2020-05-26 DIAGNOSIS — Z79899 Other long term (current) drug therapy: Secondary | ICD-10-CM | POA: Diagnosis not present

## 2020-05-26 DIAGNOSIS — I25118 Atherosclerotic heart disease of native coronary artery with other forms of angina pectoris: Secondary | ICD-10-CM | POA: Diagnosis not present

## 2020-05-26 DIAGNOSIS — I10 Essential (primary) hypertension: Secondary | ICD-10-CM | POA: Diagnosis present

## 2020-05-26 DIAGNOSIS — Z7982 Long term (current) use of aspirin: Secondary | ICD-10-CM | POA: Diagnosis not present

## 2020-05-26 DIAGNOSIS — E039 Hypothyroidism, unspecified: Secondary | ICD-10-CM | POA: Diagnosis not present

## 2020-05-26 DIAGNOSIS — I350 Nonrheumatic aortic (valve) stenosis: Secondary | ICD-10-CM | POA: Diagnosis not present

## 2020-05-26 DIAGNOSIS — C3491 Malignant neoplasm of unspecified part of right bronchus or lung: Secondary | ICD-10-CM

## 2020-05-26 DIAGNOSIS — Z7984 Long term (current) use of oral hypoglycemic drugs: Secondary | ICD-10-CM | POA: Insufficient documentation

## 2020-05-26 DIAGNOSIS — J449 Chronic obstructive pulmonary disease, unspecified: Secondary | ICD-10-CM | POA: Insufficient documentation

## 2020-05-26 DIAGNOSIS — I5032 Chronic diastolic (congestive) heart failure: Secondary | ICD-10-CM | POA: Diagnosis not present

## 2020-05-26 DIAGNOSIS — J441 Chronic obstructive pulmonary disease with (acute) exacerbation: Secondary | ICD-10-CM | POA: Diagnosis present

## 2020-05-26 DIAGNOSIS — J9621 Acute and chronic respiratory failure with hypoxia: Secondary | ICD-10-CM | POA: Diagnosis not present

## 2020-05-26 DIAGNOSIS — J45909 Unspecified asthma, uncomplicated: Secondary | ICD-10-CM | POA: Insufficient documentation

## 2020-05-26 DIAGNOSIS — I110 Hypertensive heart disease with heart failure: Secondary | ICD-10-CM | POA: Insufficient documentation

## 2020-05-26 DIAGNOSIS — I4892 Unspecified atrial flutter: Secondary | ICD-10-CM

## 2020-05-26 DIAGNOSIS — J9611 Chronic respiratory failure with hypoxia: Secondary | ICD-10-CM | POA: Diagnosis present

## 2020-05-26 DIAGNOSIS — C3431 Malignant neoplasm of lower lobe, right bronchus or lung: Secondary | ICD-10-CM | POA: Insufficient documentation

## 2020-05-26 DIAGNOSIS — C3490 Malignant neoplasm of unspecified part of unspecified bronchus or lung: Secondary | ICD-10-CM

## 2020-05-26 DIAGNOSIS — I483 Typical atrial flutter: Principal | ICD-10-CM | POA: Insufficient documentation

## 2020-05-26 DIAGNOSIS — R0789 Other chest pain: Secondary | ICD-10-CM | POA: Diagnosis present

## 2020-05-26 DIAGNOSIS — R0602 Shortness of breath: Secondary | ICD-10-CM

## 2020-05-26 DIAGNOSIS — E1169 Type 2 diabetes mellitus with other specified complication: Secondary | ICD-10-CM

## 2020-05-26 DIAGNOSIS — G4733 Obstructive sleep apnea (adult) (pediatric): Secondary | ICD-10-CM | POA: Diagnosis present

## 2020-05-26 LAB — TROPONIN I (HIGH SENSITIVITY)
Troponin I (High Sensitivity): 11 ng/L (ref <18)
Troponin I (High Sensitivity): 17 ng/L (ref <18)

## 2020-05-26 LAB — LACTIC ACID, PLASMA: Lactic Acid, Venous: 0.8 mmol/L (ref 0.5–1.9)

## 2020-05-26 LAB — RESP PANEL BY RT-PCR (FLU A&B, COVID) ARPGX2
Influenza A by PCR: NEGATIVE
Influenza B by PCR: NEGATIVE
SARS Coronavirus 2 by RT PCR: NEGATIVE

## 2020-05-26 LAB — URINALYSIS, COMPLETE (UACMP) WITH MICROSCOPIC
Bilirubin Urine: NEGATIVE
Glucose, UA: NEGATIVE mg/dL
Hgb urine dipstick: NEGATIVE
Ketones, ur: NEGATIVE mg/dL
Leukocytes,Ua: NEGATIVE
Nitrite: NEGATIVE
Protein, ur: 30 mg/dL — AB
Specific Gravity, Urine: 1.017 (ref 1.005–1.030)
pH: 5 (ref 5.0–8.0)

## 2020-05-26 LAB — BRAIN NATRIURETIC PEPTIDE: B Natriuretic Peptide: 231 pg/mL — ABNORMAL HIGH (ref 0.0–100.0)

## 2020-05-26 LAB — BASIC METABOLIC PANEL
Anion gap: 13 (ref 5–15)
BUN: 18 mg/dL (ref 8–23)
CO2: 30 mmol/L (ref 22–32)
Calcium: 9 mg/dL (ref 8.9–10.3)
Chloride: 95 mmol/L — ABNORMAL LOW (ref 98–111)
Creatinine, Ser: 0.85 mg/dL (ref 0.44–1.00)
GFR, Estimated: >60 mL/min (ref >60)
Glucose, Bld: 182 mg/dL — ABNORMAL HIGH (ref 70–99)
Potassium: 3.8 mmol/L (ref 3.5–5.1)
Sodium: 138 mmol/L (ref 135–145)

## 2020-05-26 LAB — CBC
HCT: 44.6 % (ref 36.0–46.0)
Hemoglobin: 14.5 g/dL (ref 12.0–15.0)
MCH: 29.4 pg (ref 26.0–34.0)
MCHC: 32.5 g/dL (ref 30.0–36.0)
MCV: 90.3 fL (ref 80.0–100.0)
Platelets: 261 10*3/uL (ref 150–400)
RBC: 4.94 MIL/uL (ref 3.87–5.11)
RDW: 14.6 % (ref 11.5–15.5)
WBC: 9.2 10*3/uL (ref 4.0–10.5)
nRBC: 0 % (ref 0.0–0.2)

## 2020-05-26 LAB — POC SARS CORONAVIRUS 2 AG -  ED: SARS Coronavirus 2 Ag: NEGATIVE

## 2020-05-26 LAB — CBG MONITORING, ED: Glucose-Capillary: 178 mg/dL — ABNORMAL HIGH (ref 70–99)

## 2020-05-26 LAB — D-DIMER, QUANTITATIVE: D-Dimer, Quant: 0.73 ug/mL-FEU — ABNORMAL HIGH (ref 0.00–0.50)

## 2020-05-26 LAB — MAGNESIUM: Magnesium: 1.7 mg/dL (ref 1.7–2.4)

## 2020-05-26 LAB — TSH: TSH: 1.133 u[IU]/mL (ref 0.350–4.500)

## 2020-05-26 MED ORDER — FENTANYL CITRATE (PF) 100 MCG/2ML IJ SOLN
INTRAMUSCULAR | Status: AC
  Filled 2020-05-26: qty 2

## 2020-05-26 MED ORDER — LOSARTAN POTASSIUM 25 MG PO TABS
25.0000 mg | ORAL_TABLET | Freq: Every day | ORAL | Status: DC
  Administered 2020-05-26: 25 mg via ORAL
  Filled 2020-05-26: qty 1

## 2020-05-26 MED ORDER — POTASSIUM CHLORIDE CRYS ER 20 MEQ PO TBCR
20.0000 meq | EXTENDED_RELEASE_TABLET | Freq: Once | ORAL | Status: AC
  Administered 2020-05-26: 20 meq via ORAL
  Filled 2020-05-26: qty 1

## 2020-05-26 MED ORDER — LEVOTHYROXINE SODIUM 50 MCG PO TABS: 75.0000 ug | ORAL_TABLET | Freq: Every day | ORAL | Status: DC

## 2020-05-26 MED ORDER — ENOXAPARIN SODIUM 40 MG/0.4ML ~~LOC~~ SOLN: 40.0000 mg | SUBCUTANEOUS | Status: DC

## 2020-05-26 MED ORDER — MIDAZOLAM HCL 2 MG/2ML IJ SOLN
INTRAMUSCULAR | Status: AC
  Filled 2020-05-26: qty 2

## 2020-05-26 MED ORDER — GABAPENTIN 600 MG PO TABS
600.0000 mg | ORAL_TABLET | Freq: Two times a day (BID) | ORAL | Status: DC
  Administered 2020-05-26 – 2020-05-27 (×3): 600 mg via ORAL
  Filled 2020-05-26 (×3): qty 1

## 2020-05-26 MED ORDER — TIOTROPIUM BROMIDE MONOHYDRATE 18 MCG IN CAPS
18.0000 ug | ORAL_CAPSULE | Freq: Every day | RESPIRATORY_TRACT | Status: DC
  Administered 2020-05-26: 18 ug via RESPIRATORY_TRACT
  Filled 2020-05-26: qty 5

## 2020-05-26 MED ORDER — INSULIN ASPART 100 UNIT/ML ~~LOC~~ SOLN
0.0000 [IU] | Freq: Three times a day (TID) | SUBCUTANEOUS | Status: DC
  Administered 2020-05-26: 3 [IU] via SUBCUTANEOUS
  Filled 2020-05-26: qty 1

## 2020-05-26 MED ORDER — AMIODARONE HCL IN DEXTROSE 360-4.14 MG/200ML-% IV SOLN
30.0000 mg/h | INTRAVENOUS | Status: DC
  Administered 2020-05-26: 30 mg/h via INTRAVENOUS
  Filled 2020-05-26 (×2): qty 200

## 2020-05-26 MED ORDER — SODIUM CHLORIDE 0.9% FLUSH
3.0000 mL | Freq: Two times a day (BID) | INTRAVENOUS | Status: DC
  Administered 2020-05-26 – 2020-05-27 (×2): 3 mL via INTRAVENOUS

## 2020-05-26 MED ORDER — AMIODARONE HCL IN DEXTROSE 360-4.14 MG/200ML-% IV SOLN
60.0000 mg/h | INTRAVENOUS | Status: DC
Start: 2020-05-26 — End: 2020-05-26
  Administered 2020-05-26: 60 mg/h via INTRAVENOUS
  Filled 2020-05-26: qty 200

## 2020-05-26 MED ORDER — ATORVASTATIN CALCIUM 80 MG PO TABS
80.0000 mg | ORAL_TABLET | Freq: Every day | ORAL | Status: DC
  Administered 2020-05-27: 80 mg via ORAL
  Filled 2020-05-26: qty 4

## 2020-05-26 MED ORDER — NICOTINE 21 MG/24HR TD PT24
21.0000 mg | MEDICATED_PATCH | Freq: Every day | TRANSDERMAL | Status: DC
  Administered 2020-05-26 – 2020-05-27 (×2): 21 mg via TRANSDERMAL
  Filled 2020-05-26 (×2): qty 1

## 2020-05-26 MED ORDER — CITALOPRAM HYDROBROMIDE 20 MG PO TABS
40.0000 mg | ORAL_TABLET | Freq: Every day | ORAL | Status: DC
  Administered 2020-05-26 – 2020-05-27 (×2): 40 mg via ORAL
  Filled 2020-05-26 (×3): qty 2

## 2020-05-26 MED ORDER — DILTIAZEM HCL 25 MG/5ML IV SOLN
INTRAVENOUS | Status: AC
  Administered 2020-05-26: 5 mg via INTRAVENOUS
  Filled 2020-05-26: qty 5

## 2020-05-26 MED ORDER — HYDROXYZINE PAMOATE 50 MG PO CAPS
50.0000 mg | ORAL_CAPSULE | Freq: Four times a day (QID) | ORAL | Status: DC | PRN
  Filled 2020-05-26: qty 1

## 2020-05-26 MED ORDER — FUROSEMIDE 40 MG PO TABS
40.0000 mg | ORAL_TABLET | Freq: Every day | ORAL | Status: DC
  Administered 2020-05-26 – 2020-05-27 (×2): 40 mg via ORAL
  Filled 2020-05-26 (×2): qty 1

## 2020-05-26 MED ORDER — ENOXAPARIN SODIUM 60 MG/0.6ML ~~LOC~~ SOLN
0.5000 mg/kg | SUBCUTANEOUS | Status: DC
  Administered 2020-05-27: 52.5 mg via SUBCUTANEOUS
  Filled 2020-05-26 (×2): qty 0.6

## 2020-05-26 MED ORDER — CARVEDILOL 3.125 MG PO TABS
3.1250 mg | ORAL_TABLET | Freq: Two times a day (BID) | ORAL | Status: DC
  Filled 2020-05-26: qty 1

## 2020-05-26 MED ORDER — PANTOPRAZOLE SODIUM 40 MG PO TBEC
40.0000 mg | DELAYED_RELEASE_TABLET | Freq: Every day | ORAL | Status: DC
  Administered 2020-05-26 – 2020-05-27 (×2): 40 mg via ORAL
  Filled 2020-05-26 (×2): qty 1

## 2020-05-26 MED ORDER — DILTIAZEM HCL-DEXTROSE 125-5 MG/125ML-% IV SOLN (PREMIX)
5.0000 mg/h | INTRAVENOUS | Status: DC
  Administered 2020-05-26: 10 mg/h via INTRAVENOUS
  Filled 2020-05-26: qty 125

## 2020-05-26 MED ORDER — AMIODARONE LOAD VIA INFUSION
150.0000 mg | Freq: Once | INTRAVENOUS | Status: AC
  Administered 2020-05-26: 150 mg via INTRAVENOUS
  Filled 2020-05-26: qty 83.34

## 2020-05-26 MED ORDER — DILTIAZEM HCL 25 MG/5ML IV SOLN: 5.0000 mg | Freq: Once | INTRAVENOUS | Status: AC

## 2020-05-26 MED ORDER — FENOFIBRATE 160 MG PO TABS
160.0000 mg | ORAL_TABLET | Freq: Every day | ORAL | Status: DC
Start: 2020-05-26 — End: 2020-05-27
  Administered 2020-05-26: 160 mg via ORAL
  Filled 2020-05-26 (×3): qty 1

## 2020-05-26 MED ORDER — ALBUTEROL SULFATE HFA 108 (90 BASE) MCG/ACT IN AERS
2.0000 | INHALATION_SPRAY | RESPIRATORY_TRACT | Status: DC | PRN
  Filled 2020-05-26: qty 6.7

## 2020-05-26 MED ORDER — GADOBUTROL 1 MMOL/ML IV SOLN
10.0000 mL | Freq: Once | INTRAVENOUS | Status: AC | PRN
  Administered 2020-05-26: 10 mL via INTRAVENOUS

## 2020-05-26 MED ORDER — SODIUM CHLORIDE 0.9 % IV SOLN: 250.0000 mL | INTRAVENOUS | Status: DC | PRN

## 2020-05-26 MED ORDER — SODIUM CHLORIDE 0.9% FLUSH: 3.0000 mL | INTRAVENOUS | Status: DC | PRN

## 2020-05-26 MED ORDER — OXYCODONE-ACETAMINOPHEN 5-325 MG PO TABS
1.0000 | ORAL_TABLET | Freq: Three times a day (TID) | ORAL | Status: DC | PRN
  Administered 2020-05-27 (×2): 1 via ORAL
  Filled 2020-05-26 (×2): qty 1

## 2020-05-26 MED ORDER — MAGNESIUM SULFATE 2 GM/50ML IV SOLN
2.0000 g | Freq: Once | INTRAVENOUS | Status: AC
  Administered 2020-05-26: 2 g via INTRAVENOUS
  Filled 2020-05-26: qty 50

## 2020-05-26 NOTE — ED Notes (Signed)
Pt back from MRI 

## 2020-05-26 NOTE — ED Notes (Signed)
Messaged Hospitalist regarding Parameters for amiodarone drip. Was advised to keep pt on drip , unless Hr drops below 50

## 2020-05-26 NOTE — ED Notes (Signed)
Patient transported to MRI 

## 2020-05-26 NOTE — Progress Notes (Signed)
PHARMACIST - PHYSICIAN COMMUNICATION  CONCERNING:  Enoxaparin (Lovenox) for DVT Prophylaxis    RECOMMENDATION: Patient was prescribed enoxaprin 40mg  q24 hours for VTE prophylaxis.   Filed Weights   05/26/20 0947  Weight: 104.3 kg (230 lb)    Body mass index is 42.07 kg/m.  Estimated Creatinine Clearance: 71.8 mL/min (by C-G formula based on SCr of 0.85 mg/dL).   Based on [place] patient is candidate for enoxaparin 0.5mg /kg TBW SQ every 24 hours based on BMI being >30.  DESCRIPTION: Pharmacy has adjusted enoxaparin dose per [facility] policy.  Patient is now receiving enoxaparin 52.5 mg every 24 hours    Berta Minor, PharmD Clinical Pharmacist  05/26/2020 2:24 PM

## 2020-05-26 NOTE — ED Notes (Signed)
Zoll pads applied

## 2020-05-26 NOTE — ED Provider Notes (Signed)
[facility] Emergency Department Provider Note ____________________________________________   Event Date/Time   First MD Initiated Contact with Patient 05/26/20 [phone]     (approximate)  I have reviewed the triage vital signs and the nursing notes.   HISTORY  Chief Complaint Chest Pain and Abdominal Pain    HPI Kristin [name] is a 70 y.o. female with PMH as noted below including CHF and COPD who presents with acute onset of chest pain this morning while she was here to get an MRI.  It is pressure-like and severe in intensity.  It is associated with shortness of breath.  The patient also feels lightheaded and clammy.  She denies any prior history of similar pain.  Past Medical History:  Diagnosis Date  . Anxiety   . Asthma   . CHF (congestive heart failure) ([place])    2018  . COPD (chronic obstructive pulmonary disease) ([place])   . Diabetes mellitus without complication ([place])   . Dyspnea   . GERD (gastroesophageal reflux disease)   . History of hiatal hernia   . History of orthopnea   . Hypothyroidism   . Neuropathy   . Oxygen deficiency    3L/HS  . Pain    BACK/DDD  . Peripheral vascular disease ([place])   . RLS (restless legs syndrome)   . Sleep apnea   . Wheezing     Patient Active Problem List   Diagnosis Date Noted  . Lung cancer ([place]) 05/26/2020  . Atrial flutter with rapid ventricular response ([place]) 05/26/2020  . Demand ischemia ([place])   . Acute on chronic heart failure with preserved ejection fraction (HFpEF) ([place])   . Aortic stenosis   . Obesity, Class III, BMI 40-49.9 (morbid obesity) ([place]) 03/29/2020  . Chronic respiratory failure with hypoxia ([place]) 03/29/2020  . OSA (obstructive sleep apnea) 03/29/2020  . Essential hypertension 03/29/2020  . Hypothyroidism 03/29/2020  . Chest pain 03/29/2020  . Elevated troponin 03/29/2020  . Chronic diastolic (congestive) heart failure ([place]) 07/12/2018  . Type 2 diabetes mellitus without  complication ([place]) 74/25/9563  . COPD (chronic obstructive pulmonary disease) ([place]) 07/12/2018  . Tobacco use 07/12/2018  . Hypotension 07/12/2018  . Respiratory failure ([place]) 07/04/2018    Past Surgical History:  Procedure Laterality Date  . BREAST SURGERY    . CATARACT EXTRACTION W/PHACO Right 03/02/2018   Procedure: CATARACT EXTRACTION PHACO AND INTRAOCULAR LENS PLACEMENT ([place]);  Surgeon: Marchia Meiers, MD;  Location: [facility] ORS;  Service: Ophthalmology;  Laterality: Right;  [place] 01:15 CDE 16.46 Fluid pack lot # [id number]  . CATARACT EXTRACTION W/PHACO Left 04/27/2018   Procedure: CATARACT EXTRACTION PHACO AND INTRAOCULAR LENS PLACEMENT ([place])- LEFT DIABETIC;  Surgeon: Marchia Meiers, MD;  Location: [facility] ORS;  Service: Ophthalmology;  Laterality: Left;  Lot # [id number] H [place]: 00:46.3 CDE: 8.07   . CYST EXCISION     FOREHEAD  . FOOT SURGERY     CYST  . RIGHT/LEFT HEART CATH AND CORONARY ANGIOGRAPHY N/A 03/31/2020   Procedure: RIGHT/LEFT HEART CATH AND CORONARY ANGIOGRAPHY;  Surgeon: Wellington Hampshire, MD;  Location: [place] [facility] LAB;  Service: Cardiovascular;  Laterality: N/A;  . TUBAL LIGATION      Prior to Admission medications   Medication Sig Start Date End Date Taking? Authorizing Provider  ACCU-CHEK GUIDE test strip  09/25/19   [provider]  albuterol (PROAIR HFA) 108 (90 Base) MCG/ACT inhaler Inhale 2 puffs into the lungs every 4 (four) hours as needed for wheezing or shortness of  breath.     [provider]  aspirin EC 81 MG tablet Take 81 mg by mouth daily.    [provider]  atorvastatin (LIPITOR) 80 MG tablet Take 80 mg by mouth at bedtime.  09/05/19   [provider]  carvedilol (COREG) 3.125 MG tablet Take 1 tablet (3.125 mg total) by mouth 2 (two) times daily with a meal. 07/07/18   Mayo, Pete Pelt, MD  citalopram (CELEXA) 40 MG tablet Take 40 mg by mouth daily.    [provider]  diclofenac Sodium (VOLTAREN) 1 % GEL Apply 1  application topically 4 (four) times daily.  03/27/20   [provider]  esomeprazole (NEXIUM) 40 MG capsule Take 40 mg by mouth daily. 10/01/19   [provider]  famotidine (PEPCID) 40 MG tablet Take 40 mg by mouth daily.    [provider]  fenofibrate 160 MG tablet Take 160 mg by mouth daily.    [provider]  ferrous sulfate 325 (65 FE) MG tablet Take 325 mg by mouth daily with breakfast.    [provider]  furosemide (LASIX) 40 MG tablet Take 1 tablet (40 mg total) by mouth daily for 3 days, THEN 1 tablet (40 mg total) daily. 04/02/20 05/05/20  Nita Sells, MD  gabapentin (NEURONTIN) 600 MG tablet Take 600 mg by mouth 2 (two) times daily.  11/20/19   [provider]  hydrOXYzine (VISTARIL) 50 MG capsule Take 50 mg by mouth every 6 (six) hours as needed for anxiety. 03/14/20   [provider]  levothyroxine (SYNTHROID, LEVOTHROID) 75 MCG tablet Take 75 mcg by mouth daily before breakfast.    [provider]  losartan (COZAAR) 25 MG tablet Take 1 tablet (25 mg total) by mouth daily. 04/03/20   Nita Sells, MD  metFORMIN (GLUCOPHAGE) 500 MG tablet Take 500 mg by mouth 2 (two) times daily. 09/19/19   [provider]  oxyCODONE-acetaminophen (PERCOCET/ROXICET) 5-325 MG tablet Take 1 tablet by mouth 3 (three) times daily.    [provider]  OXYGEN Inhale 3 L into the lungs at bedtime.     [provider]  sitaGLIPtin (JANUVIA) 100 MG tablet Take 100 mg by mouth daily.    [provider]  Tiotropium Bromide Monohydrate (SPIRIVA RESPIMAT) 2.5 MCG/ACT AERS Inhale 2 puffs into the lungs daily.     [provider]    Allergies Altace [ramipril]  Family History  Problem Relation Age of Onset  . Heart disease Mother   . Cancer Father     Social History Social History   Tobacco Use  . Smoking status: Current Every Day Smoker    Packs/day: 1.00    Years: 50.00     Pack years: 50.00    Types: Cigarettes  . Smokeless tobacco: Never Used  Substance Use Topics  . Alcohol use: Not Currently    Review of Systems  Constitutional: No fever/chills.  Positive for sweating. Eyes: No visual changes. ENT: No sore throat. Cardiovascular: Positive for chest pain. Respiratory: Positive for shortness of breath. Gastrointestinal: No vomiting. Genitourinary: Negative for dysuria.  Musculoskeletal: Negative for back pain. Skin: Negative for rash. Neurological: Negative for headache.   ____________________________________________   PHYSICAL EXAM:  VITAL SIGNS: ED Triage Vitals  Enc Vitals Group     BP 05/26/20 0949 137/83     Pulse Rate 05/26/20 0949 (!) 137     Resp 05/26/20 0949 (!) 24     Temp 05/26/20 0949 98 F (  36.7 C)     Temp Source 05/26/20 0949 Oral     SpO2 05/26/20 0949 92 %     Weight 05/26/20 0947 230 lb (104.3 kg)     Height 05/26/20 0947 5\' 2"  (1.575 m)     Head Circumference --      Peak Flow --      Pain Score 05/26/20 0947 7     Pain Loc --      Pain Edu? --      Excl. in [place]? --     Constitutional: Alert and oriented.  Uncomfortable appearing, diaphoretic but in no acute distress. Eyes: Conjunctivae are normal.  Head: Atraumatic. Nose: No congestion/rhinnorhea. Mouth/Throat: Mucous membranes are moist.   Neck: Normal range of motion.  Cardiovascular: Tachycardic, irregular rhythm. Grossly normal heart sounds.  Good peripheral circulation. Respiratory: Slightly increased respiratory effort.  No retractions. Lungs CTAB. Gastrointestinal: Soft and nontender. No distention.  Genitourinary: No flank tenderness. Musculoskeletal: Minimal bilateral lower extremity edema.  Extremities warm and well perfused.  Neurologic:  Normal speech and language. No gross focal neurologic deficits are appreciated.  Skin:  Skin is warm and dry. No rash noted. Psychiatric: Slightly anxious  appearing.  ____________________________________________   LABS (all labs ordered are listed, but only abnormal results are displayed)  Labs Reviewed  BASIC METABOLIC PANEL - Abnormal; Notable for the following components:      Result Value   Chloride 95 (*)    Glucose, Bld 182 (*)    All other components within normal limits  BRAIN NATRIURETIC PEPTIDE - Abnormal; Notable for the following components:   B Natriuretic Peptide 231.0 (*)    All other components within normal limits  RESP PANEL BY RT-PCR (FLU A&B, COVID) ARPGX2  CBC  TSH  MAGNESIUM  LACTIC ACID, PLASMA  LACTIC ACID, PLASMA  URINALYSIS, COMPLETE (UACMP) WITH MICROSCOPIC  D-DIMER, QUANTITATIVE (NOT AT [facility])  POC SARS CORONAVIRUS 2 AG -  ED  TROPONIN I (HIGH SENSITIVITY)  TROPONIN I (HIGH SENSITIVITY)   ____________________________________________  EKG  ED ECG REPORT I, Arta Silence, the attending physician, personally viewed and interpreted this ECG.  Date: 05/26/2020 EKG Time: 0943 Rate: 130 Rhythm: Atrial flutter with variable block QRS Axis: normal Intervals: normal ST/T Wave abnormalities: Nonspecific abnormalities Narrative Interpretation: Atrial flutter with no evidence of acute ischemia   ED ECG REPORT I, Arta Silence, the attending physician, personally viewed and interpreted this ECG.  Date: 05/26/2020 EKG Time: 1049 Rate: 120 Rhythm: Atrial flutter with 2:1 block QRS Axis: normal Intervals: Prolonged QTc ST/T Wave abnormalities: Nonspecific repolarization abnormality Narrative Interpretation: Atrial flutter   ED ECG REPORT I, Arta Silence, the attending physician, personally viewed and interpreted this ECG.  Date: 05/26/2020 EKG Time: 1216 Rate: 57 Rhythm: normal sinus rhythm QRS Axis: Borderline right axis Intervals: normal ST/T Wave abnormalities: normal Narrative Interpretation: Normal sinus rhythm with no evidence of acute  ischemia    ____________________________________________  RADIOLOGY  Chest x-ray interpreted by me shows right lung nodular opacity with no focal infiltrate or edema  ____________________________________________   PROCEDURES  Procedure(s) performed: No  Procedures  Critical Care performed: Yes  CRITICAL CARE Performed by: Arta Silence   Total critical care time: 40 minutes  Critical care time was exclusive of separately billable procedures and treating other patients.  Critical care was necessary to treat or prevent imminent or life-threatening deterioration.  Critical care was time spent personally by me on the following activities: development of treatment plan with patient and/or  surrogate as well as nursing, discussions with consultants, evaluation of patient's response to treatment, examination of patient, obtaining history from patient or surrogate, ordering and performing treatments and interventions, ordering and review of laboratory studies, ordering and review of radiographic studies, pulse oximetry and re-evaluation of patient's condition. ____________________________________________   INITIAL IMPRESSION / ASSESSMENT AND PLAN / ED COURSE  Pertinent labs & imaging results that were available during my care of the patient were reviewed by me and considered in my medical decision making (see chart for details).  70 year old female with PMH as noted above including CHF, COPD, and a recent diagnosis of lung cancer presents with acute onset of chest pain, shortness of breath, lightheadedness and diaphoresis this morning while here for an MRI.  Upon being brought into the room, the patient had a heart rate between 190 and 210.  She appeared diaphoretic.  Her other vital signs were normal, including her blood pressure.  Physical exam is otherwise as described above.  Initial EKG looked like SVT or atrial fibrillation with RVR.  Given the significant tachycardia  and the patient's acute symptoms, I initially considered electrical cardioversion.  However after few minutes the rate went down to the 140s and the blood pressure remained stable.  The patient's acute chest pain and lightheadedness resolved.  Repeat EKG confirms atrial flutter.  I reviewed the past medical records in [place].  The patient was admitted last month with NSTEMI.  She has severe aortic stenosis.  She has diastolic heart failure and was diuresed during the hospitalization.  She was also recently diagnosed with a right lung mass and is scheduled for biopsy.  We will give the patient IV Cardizem for rate control, obtain lab work-up, and reassess.  ----------------------------------------- 12:07 PM on 05/26/2020 -----------------------------------------  The patient had no improvement with the initial dose of IV Cardizem.  We started her on a Cardizem infusion, but at the maximum rate her heart rate was still in the 140s.  I then started the patient on an amiodarone drip, with improvement in the rate to around 110.  I consulted Dr. Rockey Situ from cardiology who saw the patient during her last admission.  He agrees with the current management and will follow.  I subsequently discussed the case with Dr. Si Raider from the hospitalist service for admission.  ----------------------------------------- 2:15 PM on 05/26/2020 -----------------------------------------  Shortly after the last reassessment, the patient converted to sinus rhythm.  The Cardizem and amiodarone have been discontinued.  ___________________________  Drucilla Chalet Lantzy was evaluated in Emergency Department on 05/26/2020 for the symptoms described in the history of present illness. She was evaluated in the context of the global COVID-19 pandemic, which necessitated consideration that the patient might be at risk for infection with the SARS-CoV-2 virus that causes COVID-19. Institutional protocols and algorithms that pertain to the  evaluation of patients at risk for COVID-19 are in a state of rapid change based on information released by regulatory bodies including the CDC and federal and state organizations. These policies and algorithms were followed during the patient's care in the ED.  ____________________________________________   FINAL CLINICAL IMPRESSION(S) / ED DIAGNOSES  Final diagnoses:  Atrial flutter, unspecified type ([place])      NEW MEDICATIONS STARTED DURING THIS VISIT:  New Prescriptions   No medications on file     Note:  This document was prepared using Dragon voice recognition software and may include unintentional dictation errors.   Arta Silence, MD 05/26/20 1416

## 2020-05-26 NOTE — ED Notes (Signed)
Pt arrived to room tachy, cp 10/10. HR 228 afib, Dr. Cherylann Banas called to room

## 2020-05-26 NOTE — H&P (Signed)
History and Physical    Kristin [name] ENI:[id number] DOB: Oct 15, 1950 DOA: 05/26/2020  PCP: Patient, No Pcp Per  Patient coming from: home   Chief Complaint: chest pain  HPI: Kristin [name] is a 70 y.o. female with medical history significant for dCHF, DM, copd, smoker, htn, chronic hypoxic respiratory failure on 3 L, OSA not on cpap, hypothyroid, severe aortic stenosis, and non-obstructive CAD, as well as morbid obesity, who presents with the above.  Awoke this morning with some central chest pain that radiated down her arms. Also felt nauseaus. Went to outpatient imaging for MRI and there felt weak and diaphoretic, so mri was canceled, and patient presented to the ED. No fevers or cough. No vomiting or diarrhea. No LE swelling. No recent med changes.  Patient did recently have CTA as part of w/u for TAVR for her severe aortic stenosis. CTA showed RLL mass concerning for malignancy. F/u PET scan is suggestive of malignancy. The MRI today was part of her oncology w/u. She was to call oncology today to schedule her first appointment.   ED Course:   On arrival HR in the 200s a flutter. Given diltiazem bolus and then gtt. Remained in RVR, so amiodarone given and cardiology consulted.   Review of Systems: As per HPI otherwise 10 point review of systems negative.    Past Medical History:  Diagnosis Date  . Anxiety   . Asthma   . CHF (congestive heart failure) ([place])    2018  . COPD (chronic obstructive pulmonary disease) ([place])   . Diabetes mellitus without complication ([place])   . Dyspnea   . GERD (gastroesophageal reflux disease)   . History of hiatal hernia   . History of orthopnea   . Hypothyroidism   . Neuropathy   . Oxygen deficiency    3L/HS  . Pain    BACK/DDD  . Peripheral vascular disease ([place])   . RLS (restless legs syndrome)   . Sleep apnea   . Wheezing     Past Surgical History:  Procedure Laterality Date  . BREAST SURGERY    . CATARACT EXTRACTION W/PHACO Right  03/02/2018   Procedure: CATARACT EXTRACTION PHACO AND INTRAOCULAR LENS PLACEMENT ([place]);  Surgeon: Marchia Meiers, MD;  Location: [facility] ORS;  Service: Ophthalmology;  Laterality: Right;  [place] 01:15 CDE 16.46 Fluid pack lot # [id number]  . CATARACT EXTRACTION W/PHACO Left 04/27/2018   Procedure: CATARACT EXTRACTION PHACO AND INTRAOCULAR LENS PLACEMENT ([place])- LEFT DIABETIC;  Surgeon: Marchia Meiers, MD;  Location: [facility] ORS;  Service: Ophthalmology;  Laterality: Left;  Lot # [id number] [place]: 00:46.3 CDE: 8.07   . CYST EXCISION     FOREHEAD  . FOOT SURGERY     CYST  . RIGHT/LEFT HEART CATH AND CORONARY ANGIOGRAPHY N/A 03/31/2020   Procedure: RIGHT/LEFT HEART CATH AND CORONARY ANGIOGRAPHY;  Surgeon: Wellington Hampshire, MD;  Location: [facility];  Service: Cardiovascular;  Laterality: N/A;  . TUBAL LIGATION       reports that she has been smoking cigarettes. She has a 50.00 pack-year smoking history. She has never used smokeless tobacco. She reports previous alcohol use. No history on file for drug use.  Allergies  Allergen Reactions  . Altace [Ramipril] Swelling    Family History  Problem Relation Age of Onset  . Heart disease Mother   . Cancer Father     Prior to Admission medications   Medication Sig Start Date End Date Taking? Authorizing Provider  ACCU-CHEK GUIDE test strip  09/25/19   [provider]  albuterol (PROAIR HFA) 108 (90 Base) MCG/ACT inhaler Inhale 2 puffs into the lungs every 4 (four) hours as needed for wheezing or shortness of breath.     [provider]  aspirin EC 81 MG tablet Take 81 mg by mouth daily.    [provider]  atorvastatin (LIPITOR) 80 MG tablet Take 80 mg by mouth at bedtime.  09/05/19   [provider]  carvedilol (COREG) 3.125 MG tablet Take 1 tablet (3.125 mg total) by mouth 2 (two) times daily with a meal. 07/07/18   Mayo, Pete Pelt, MD  citalopram (CELEXA) 40 MG tablet Take 40 mg by mouth daily.    [provider]  diclofenac Sodium (VOLTAREN) 1 % GEL Apply 1 application topically 4 (four) times daily.  03/27/20   [provider]  esomeprazole (NEXIUM) 40 MG capsule Take 40 mg by mouth daily. 10/01/19   [provider]  famotidine (PEPCID) 40 MG tablet Take 40 mg by mouth daily.    [provider]  fenofibrate 160 MG tablet Take 160 mg by mouth daily.    [provider]  ferrous sulfate 325 (65 FE) MG tablet Take 325 mg by mouth daily with breakfast.    [provider]  furosemide (LASIX) 40 MG tablet Take 1 tablet (40 mg total) by mouth daily for 3 days, THEN 1 tablet (40 mg total) daily. 04/02/20 05/05/20  Nita Sells, MD  gabapentin (NEURONTIN) 600 MG tablet Take 600 mg by mouth 2 (two) times daily.  11/20/19   [provider]  hydrOXYzine (VISTARIL) 50 MG capsule Take 50 mg by mouth every 6 (six) hours as needed for anxiety. 03/14/20   [provider]  levothyroxine (SYNTHROID, LEVOTHROID) 75 MCG tablet Take 75 mcg by mouth daily before breakfast.    [provider]  losartan (COZAAR) 25 MG tablet Take 1 tablet (25 mg total) by mouth daily. 04/03/20   Nita Sells, MD  metFORMIN (GLUCOPHAGE) 500 MG tablet Take 500 mg by mouth 2 (two) times daily. 09/19/19   [provider]  oxyCODONE-acetaminophen (PERCOCET/ROXICET) 5-325 MG tablet Take 1 tablet by mouth 3 (three) times daily.    [provider]  OXYGEN Inhale 3 L into the lungs at bedtime.     [provider]  sitaGLIPtin (JANUVIA) 100 MG tablet Take 100 mg by mouth daily.    [provider]  Tiotropium Bromide Monohydrate (SPIRIVA RESPIMAT) 2.5 MCG/ACT AERS Inhale 2 puffs into the lungs daily.     [provider]    Physical Exam: Vitals:   05/26/20 1245 05/26/20 1300 05/26/20 1315 05/26/20 1330  BP: 138/72 138/66 (!) 150/62 (!) 138/56  Pulse: (!) 59 (!) 56 (!) 59 (!) 58  Resp: 17 19 19 18   Temp:       TempSrc:      SpO2: 94% 94% 95% 95%  Weight:      Height:        Constitutional: No acute distress Head: Atraumatic Eyes: Conjunctiva clear ENM: Moist mucous membranes. Normal dentition.  Neck: Supple Respiratory: Clear to auscultation bilaterally, no wheezing/rales/rhonchi. Normal respiratory effort. No accessory muscle use. . Cardiovascular: Regular rate and rhythm. Harsh holosystolic murmur Abdomen: Non-tender, obese. No masses. No rebound or guarding. Positive bowel sounds. Musculoskeletal: No joint deformity upper and lower extremities. Normal ROM, no contractures. Normal muscle tone.  Skin: No rashes, lesions, or ulcers.  Extremities: No peripheral edema. Palpable peripheral pulses. Neurologic:  Alert, moving all 4 extremities. Psychiatric: Normal insight and judgement.   Labs on Admission: I have personally reviewed following labs and imaging studies  CBC: Recent Labs  Lab 05/26/20 0949  WBC 9.2  HGB 14.5  HCT 44.6  MCV 90.3  PLT 818   Basic Metabolic Panel: Recent Labs  Lab 05/23/20 1106 05/26/20 0949  NA 141 138  K 4.5 3.8  CL 100 95*  CO2 34* 30  GLUCOSE 136* 182*  BUN 12 18  CREATININE 0.79 0.85  CALCIUM 8.5* 9.0   GFR: Estimated Creatinine Clearance: 71.8 mL/min (by C-G formula based on SCr of 0.85 mg/dL). Liver Function Tests: No results for input(s): AST, ALT, ALKPHOS, BILITOT, PROT, ALBUMIN in the last 168 hours. No results for input(s): LIPASE, AMYLASE in the last 168 hours. No results for input(s): AMMONIA in the last 168 hours. Coagulation Profile: No results for input(s): INR, PROTIME in the last 168 hours. Cardiac Enzymes: No results for input(s): CKTOTAL, CKMB, CKMBINDEX, TROPONINI in the last 168 hours. BNP (last 3 results) No results for input(s): PROBNP in the last 8760 hours. HbA1C: No results for input(s): HGBA1C in the last 72 hours. CBG: No results for input(s): GLUCAP in the last 168 hours. Lipid Profile: No results for  input(s): CHOL, HDL, LDLCALC, TRIG, CHOLHDL, LDLDIRECT in the last 72 hours. Thyroid Function Tests: No results for input(s): TSH, T4TOTAL, FREET4, T3FREE, THYROIDAB in the last 72 hours. Anemia Panel: No results for input(s): VITAMINB12, FOLATE, FERRITIN, TIBC, IRON, RETICCTPCT in the last 72 hours. Urine analysis:    Component Value Date/Time   COLORURINE STRAW (A) 07/04/2018 1954   APPEARANCEUR CLEAR (A) 07/04/2018 1954   LABSPEC 1.006 07/04/2018 1954   PHURINE 5.0 07/04/2018 [place] NEGATIVE 07/04/2018 [place] NEGATIVE 07/04/2018 [place] NEGATIVE 07/04/2018 [place] NEGATIVE 07/04/2018 [place] NEGATIVE 07/04/2018 1954   NITRITE NEGATIVE 07/04/2018 1954   LEUKOCYTESUR NEGATIVE 07/04/2018 1954    Radiological Exams on Admission: DG Chest 1 View  Result Date: 05/26/2020 CLINICAL DATA:  Chest pain EXAM: CHEST  1 VIEW COMPARISON:  March 28, 2020 chest radiograph and chest CT April 08, 2020; PET-CT May 13, 2020 FINDINGS: Mass noted on recent prior CT examinations in the right lower lobe is less well delineated by radiography. It is seen at the hilar level on frontal view measuring 1.8 x 1.4 cm. Lungs elsewhere are clear. Heart is mildly enlarged with pulmonary vascularity normal. No adenopathy. There is aortic atherosclerosis. No bone lesions. No pneumothorax. IMPRESSION: Nodular opacity on the right seen lateral to the right hilum on frontal radiograph, measuring 1.8 x 1.4 cm. This lesion is better delineated on recent CT examinations. No edema or airspace opacity. Stable cardiac enlargement. Aortic Atherosclerosis (ICD10-I70.0). Electronically Signed   By: Lowella Grip III M.D.   On: 05/26/2020 10:13    EKG: Independently reviewed. Initially a flutter with rvr, most recent nsr  Assessment/Plan Principal Problem:   Atrial flutter with rapid ventricular response (HCC) Active Problems:   Chronic diastolic (congestive) heart  failure (HCC)   Type 2 diabetes mellitus without complication (HCC)   COPD (chronic obstructive pulmonary disease) (HCC)   Tobacco use   Obesity, Class III, BMI 40-49.9 (morbid obesity) (HCC)   Chronic respiratory failure with hypoxia (HCC)   OSA (obstructive sleep apnea)   Essential hypertension   Hypothyroidism   Aortic stenosis   Lung cancer ([place])   # Atrial flutter with RVR  With history of dCHF, severe aortic stenosis, chronic hypoxic respiratory failure, OSA, and new presumed lung cancer. No hx of prior. [id number] 4. Has converted to sinus, is hemodynamically stable. Negative troponins, CXR w/o acute findings other than the known right lung mass. Cardiology consulted. Symptoms have resolved. - per cardiology continue amio gtt overnight, convert to oral in the AM - holding on anticoagulation for now per cardiology given brief duration of a flutter and pending brain MRI to eval for mets - continue home coreg - f/u TSH - f/u d dimer, given malignancy and other risk factors is high risk for pe which can precipitate new a fib/flutter  # Presumed lung cancer MRI today canceled due to the above - now that symptoms resolved and converted to sinus rhythm will order mri of brain, hopefully can happen prior to discharge - has biopsy scheduled for 1/13  # dCHF Euvolemic - cont home lasix  # T2DM Here glucose mildly elevated 182 - hold home metformin, januvia - SSI  # Chronic hypoxic respiratory failure Stable on home 3 L - cont o2  # HTN Here bp wnl - cont home coreg, losartan, lasix  # Hypothyroid - f/u tsh - home levo  # Chronic pain - home oxy prn and gabapentin  # MDD - home celexa  # OSA Not on cpap at home    DVT prophylaxis: lovenox Code Status: full  Family Communication: daughter updated telephonically Consults called: cardiology    Status is: Observation  The patient remains OBS appropriate and will d/c before 2 midnights.  Dispo: The patient is  from: Home              Anticipated d/c is to: Home              Anticipated d/c date is: 1 day              Patient currently is not medically stable to d/c.        Desma Maxim MD [facility] Pager [phone]  If 7PM-7AM, please contact night-coverage [email or web address] Password [email or web address]  05/26/2020, 1:40 PM

## 2020-05-26 NOTE — ED Triage Notes (Signed)
Pt to ED via wheelchair from MRI where patient had sudden onset substernal CP and abdominal pain with emesis. Pt on chronic 3L via [place]. Pt states CP 7/10 at this time.

## 2020-05-26 NOTE — Consult Note (Signed)
Cardiology Consultation:   Patient ID: Kristin [name]; [id number]; 1951/05/16   Admit date: 05/26/2020 Date of Consult: 05/26/2020  Primary Care Provider: Patient, No Pcp Per Primary Cardiologist: [place] Primary Electrophysiologist:  None   Patient Profile:   Kristin [name] is a 70 y.o. 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 Cherylann Banas.  History of Present Illness:   Ms. Rawl underwent prior echo in 06/2018 showed an EF of 60 to 65%, normal LV diastolic function parameters, normal RV systolic function and ventricular cavity size, mildly dilated left atrium, aortic valve with indeterminate number of cusps with severe calcification and moderate stenosis with a mean gradient of 26 mmHg and a valve area 1.39 cm.   She was recently admitted to the hospital in 03/2020 with chest pain and mild troponin elevation.  Echo showed an EF of 55 to 60%, no regional wall motion normalities, mild LVH, grade 1 diastolic dysfunction, normal RV systolic function and ventricular cavity size, mildly dilated left atrium, moderate to severe aortic stenosis with a valve area 1.11 cm and a mean gradient of 31 mmHg.  Diagnostic R/LHC on 03/31/2020 showed mild to moderate nonobstructive CAD with your stenosis being 60% of the proximal LCx.  Coronary arteries were overall moderately calcified.  RHC showed mildly elevated left sided filling pressures, moderate pulmonary hypertension, and moderately reduced cardiac output.  There was severe aortic stenosis with a mean gradient of 31 mmHg and calculated valve area 0.7 cm.  She was evaluated by the structural heart team on 04/04/2020 and elected to undergo TAVR.  As part of this work-up she underwent CTA of the  chest/aorta,/abdomen/pelvis/coronary arteries on 04/14/2020.  On this imaging, incidentally noted was a 1.9 x 1.4 x 1.6 cm mass in the superior segment of the right lower lobe suspicious for primary lung cancer.  Subsequent PET scan on 05/13/2020 showed a 2.1 x 1.4 cm right lower lobe pulmonary nodule that was hypermetabolic and compatible with malignancy with no appreciable nodal or metastatic spread.   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  While in MRI she developed sudden onset of chest and abdominal pain with emesis.  She was transferred to the ED where she was noted to be in atrial flutter with RVR ventricular rates in the 130s bpm.  Initial high-sensitivity troponin 11 with a delta of 17.  BNP 231.  CBC unrevealing.  Potassium 3.8, BUN 18, serum creatinine 0.85.  COVID pending.  Chest x-ray showed a nodular opacity lateral to the right hilum with no edema or airspace opacity with stable cardiac enlargement, and aortic atherosclerosis.  In the ED she received 5 mg IV diltiazem and was placed on Cardizem and amiodarone drips.  She subsequently converted to a junctional rhythm at 12:08 PM with a 4.1-second posttermination pause and is currently in sinus rhythm with heart rates in the 60s bpm.  Following conversion to sinus rhythm she indicates her symptoms have resolved and feels back to baseline outside of some mild nausea    Past Medical History:  Diagnosis Date  . Anxiety   . Asthma   . CHF (congestive heart failure) ([place])    2018  . COPD (chronic obstructive pulmonary  disease) ([place])   . Diabetes mellitus without complication ([place])   . Dyspnea   . GERD (gastroesophageal reflux disease)   . History of hiatal hernia   . History of orthopnea   . Hypothyroidism   . Neuropathy   . Oxygen deficiency    3L/HS  . Pain    BACK/DDD  . Peripheral vascular disease ([place])   . RLS  (restless legs syndrome)   . Sleep apnea   . Wheezing     Past Surgical History:  Procedure Laterality Date  . BREAST SURGERY    . CATARACT EXTRACTION W/PHACO Right 03/02/2018   Procedure: CATARACT EXTRACTION PHACO AND INTRAOCULAR LENS PLACEMENT ([place]);  Surgeon: Marchia Meiers, MD;  Location: [facility] ORS;  Service: Ophthalmology;  Laterality: Right;  [place] 01:15 CDE 16.46 Fluid pack lot # [id number]  . CATARACT EXTRACTION W/PHACO Left 04/27/2018   Procedure: CATARACT EXTRACTION PHACO AND INTRAOCULAR LENS PLACEMENT ([place])- LEFT DIABETIC;  Surgeon: Marchia Meiers, MD;  Location: [facility] ORS;  Service: Ophthalmology;  Laterality: Left;  Lot # [id number] H [place]: 00:46.3 CDE: 8.07   . CYST EXCISION     FOREHEAD  . FOOT SURGERY     CYST  . RIGHT/LEFT HEART CATH AND CORONARY ANGIOGRAPHY N/A 03/31/2020   Procedure: RIGHT/LEFT HEART CATH AND CORONARY ANGIOGRAPHY;  Surgeon: Wellington Hampshire, MD;  Location: [facility];  Service: Cardiovascular;  Laterality: N/A;  . TUBAL LIGATION       Home Meds: Prior to Admission medications   Medication Sig Start Date End Date Taking? Authorizing Provider  ACCU-CHEK GUIDE test strip  09/25/19   [provider]  albuterol (PROAIR HFA) 108 (90 Base) MCG/ACT inhaler Inhale 2 puffs into the lungs every 4 (four) hours as needed for wheezing or shortness of breath.     [provider]  aspirin EC 81 MG tablet Take 81 mg by mouth daily.    [provider]  atorvastatin (LIPITOR) 80 MG tablet Take 80 mg by mouth at bedtime.  09/05/19   [provider]  carvedilol (COREG) 3.125 MG tablet Take 1 tablet (3.125 mg total) by mouth 2 (two) times daily with a meal. 07/07/18   Mayo, Pete Pelt, MD  citalopram (CELEXA) 40 MG tablet Take 40 mg by mouth daily.    [provider]  diclofenac Sodium (VOLTAREN) 1 % GEL Apply 1 application topically 4 (four) times daily.  03/27/20   [provider]  esomeprazole (NEXIUM) 40 MG capsule  Take 40 mg by mouth daily. 10/01/19   [provider]  famotidine (PEPCID) 40 MG tablet Take 40 mg by mouth daily.    [provider]  fenofibrate 160 MG tablet Take 160 mg by mouth daily.    [provider]  ferrous sulfate 325 (65 FE) MG tablet Take 325 mg by mouth daily with breakfast.    [provider]  furosemide (LASIX) 40 MG tablet Take 1 tablet (40 mg total) by mouth daily for 3 days, THEN 1 tablet (40 mg total) daily. 04/02/20 05/05/20  Nita Sells, MD  gabapentin (NEURONTIN) 600 MG tablet Take 600 mg by mouth 2 (two) times daily.  11/20/19   [provider]  hydrOXYzine (VISTARIL) 50 MG capsule Take 50 mg by mouth every 6 (six) hours as needed for anxiety. 03/14/20   [provider]  levothyroxine (SYNTHROID, LEVOTHROID) 75 MCG tablet Take 75 mcg by mouth daily before breakfast.    [provider]  losartan (COZAAR) 25 MG tablet  Take 1 tablet (25 mg total) by mouth daily. 04/03/20   Nita Sells, MD  metFORMIN (GLUCOPHAGE) 500 MG tablet Take 500 mg by mouth 2 (two) times daily. 09/19/19   [provider]  oxyCODONE-acetaminophen (PERCOCET/ROXICET) 5-325 MG tablet Take 1 tablet by mouth 3 (three) times daily.    [provider]  OXYGEN Inhale 3 L into the lungs at bedtime.     [provider]  sitaGLIPtin (JANUVIA) 100 MG tablet Take 100 mg by mouth daily.    [provider]  Tiotropium Bromide Monohydrate (SPIRIVA RESPIMAT) 2.5 MCG/ACT AERS Inhale 2 puffs into the lungs daily.     [provider]    Inpatient Medications: Scheduled Meds:  Continuous Infusions: . amiodarone 60 mg/hr (05/26/20 1110)   Followed by  . amiodarone    . diltiazem (CARDIZEM) infusion Stopped (05/26/20 1109)   PRN Meds:   Allergies:   Allergies  Allergen Reactions  . Altace [Ramipril] Swelling    Social History:   Social History   Socioeconomic History  . Marital status:  Widowed    Spouse name: Not on file  . Number of children: 4  . Years of education: Not on file  . Highest education level: Not on file  Occupational History  . Occupation: Health and safety inspector  Tobacco Use  . Smoking status: Current Every Day Smoker    Packs/day: 1.00    Years: 50.00    Pack years: 50.00    Types: Cigarettes  . Smokeless tobacco: Never Used  Substance and Sexual Activity  . Alcohol use: Not Currently  . Drug use: Not on file  . Sexual activity: Not on file  Other Topics Concern  . Not on file  Social History Narrative  . Not on file   Social Determinants of Health   Financial Resource Strain: Not on file  Food Insecurity: Not on file  Transportation Needs: Not on file  Physical Activity: Not on file  Stress: Not on file  Social Connections: Not on file  Intimate Partner Violence: Not on file     Family History:   Family History  Problem Relation Age of Onset  . Heart disease Mother   . Cancer Father     ROS:  Review of Systems  Constitutional: Positive for malaise/fatigue. Negative for chills, diaphoresis, fever and weight loss.  HENT: Negative for congestion.   Eyes: Negative for discharge and redness.  Respiratory: Positive for shortness of breath. Negative for cough, hemoptysis, sputum production and wheezing.   Cardiovascular: Positive for chest pain and palpitations. Negative for orthopnea, claudication, leg swelling and PND.  Gastrointestinal: Positive for nausea. Negative for abdominal pain, blood in stool, heartburn, melena and vomiting.  Genitourinary: Negative for hematuria.  Musculoskeletal: Negative for falls and myalgias.  Skin: Negative for rash.  Neurological: Positive for weakness. Negative for dizziness, tingling, tremors, sensory change, speech change, focal weakness and loss of consciousness.  Endo/Heme/Allergies: Does not bruise/bleed easily.  Psychiatric/Behavioral: Negative for substance abuse. The patient is not  nervous/anxious.   All other systems reviewed and are negative.     Physical Exam/Data:   Vitals:   05/26/20 1100 05/26/20 1115 05/26/20 1130 05/26/20 1145  BP: 103/83 (!) 117/94 (!) 135/96 (!) 126/93  Pulse: (!) 134 (!) 119 (!) 115 (!) 127  Resp: (!) 23 16 19 17   Temp:      TempSrc:      SpO2: 95% 95% 96% 96%  Weight:      Height:  Intake/Output Summary (Last 24 hours) at 05/26/2020 1256 Last data filed at 05/26/2020 1109 Gross per 24 hour  Intake 10.08 ml  Output --  Net 10.08 ml   Filed Weights   05/26/20 0947  Weight: 104.3 kg   Body mass index is 42.07 kg/m.   Physical Exam: General: Well developed, well nourished, in no acute distress. Head: Normocephalic, atraumatic, sclera non-icteric, no xanthomas, nares without discharge.  Neck: Negative for carotid bruits. JVD not elevated. Lungs: Clear bilaterally to auscultation without wheezes, rales, or rhonchi. Breathing is unlabored. Heart: RRR with S1 S2. III/VI systolic murmur RUSB, no rubs, or gallops appreciated. Abdomen: Soft, non-tender, non-distended with normoactive bowel sounds. No hepatomegaly. No rebound/guarding. No obvious abdominal masses. Msk:  Strength and tone appear normal for age. Extremities: No clubbing or cyanosis. No edema. Distal pedal pulses are 2+ and equal bilaterally. Neuro: Alert and oriented X 3. No facial asymmetry. No focal deficit. Moves all extremities spontaneously. Psych:  Responds to questions appropriately with a normal affect.   EKG:  The EKG was personally reviewed and demonstrates: Atrial flutter with RVR, 130 bpm,, nonspecific ST-T changes Telemetry:  Telemetry was personally reviewed and demonstrates: atrial flutter with RVR converting to initial junctional rhythm at 12:08 PM today with a 4.1 second pause, currently in sinus rhythm with a heart rate in the 60s bpm  Weights: Filed Weights   05/26/20 0947  Weight: 104.3 kg    Relevant CV Studies:  2D echo  07/05/2018: 1. The left ventricle has normal systolic function with an ejection  fraction of 60-65%. The cavity size was normal. Left ventricular diastolic  parameters were normal.  2. The right ventricle has normal systolic function. The cavity was  normal. There is no increase in right ventricular wall thickness.  3. Left atrial size was mildly dilated.  4. The aortic valve has an indeterminant number of cusps Severe  calcifcation of the aortic valve. moderate stenosis of the aortic valve.  5. The inferior vena cava was dilated in size with >50% respiratory  variability.  6. Unable to estimate RVSP  __________  2D echo 03/29/2020: 1. Left ventricular ejection fraction, by estimation, is 55 to 60%. The  left ventricle has normal function. The left ventricle has no regional  wall motion abnormalities. There is mild left ventricular hypertrophy.  Left ventricular diastolic parameters  are consistent with Grade I diastolic dysfunction (impaired relaxation).  2. Right ventricular systolic function is normal. The right ventricular  size is normal. Tricuspid regurgitation signal is inadequate for assessing  PA pressure.  3. Left atrial size was mildly dilated.  4. The aortic valve was not well visualized. Moderate to severe aortic  valve stenosis. Aortic valve area, by VTI measures 1.11 cm. Aortic valve  mean gradient measures 31.0 mmHg. Aortic valve Vmax measures 3.66 m/s. __________  [facility] 03/31/2020:  Prox RCA lesion is 30% stenosed.  Mid RCA lesion is 40% stenosed.  RPDA lesion is 30% stenosed.  Prox Cx lesion is 60% stenosed.  Mid LAD lesion is 20% stenosed.  Mid Cx to Dist Cx lesion is 20% stenosed.   1.  Mild to moderate nonobstructive coronary artery disease.  Worst stenosis is 60% in the proximal left circumflex.  No evidence of obstructive disease.  Coronary arteries are overall moderately calcified. 2.  Right heart catheterization showed moderately elevated  left-sided filling pressures, moderate pulmonary hypertension and moderately reduced cardiac output. 3.  Severe aortic stenosis with mean gradient of 31 mmHg and calculated valve  area of 0.7 cm.  Recommendations: The patient is significantly volume overloaded.  I switched furosemide to intravenous 20 mg twice daily.  I increased carvedilol for better blood pressure control. Recommend medical therapy for nonobstructive coronary artery disease. Recommend outpatient TAVR evaluation.   Laboratory Data:  Chemistry Recent Labs  Lab 05/23/20 1106 05/26/20 0949  NA 141 138  K 4.5 3.8  CL 100 95*  CO2 34* 30  GLUCOSE 136* 182*  BUN 12 18  CREATININE 0.79 0.85  CALCIUM 8.5* 9.0  GFRNONAA >60 >60  ANIONGAP 7 13    No results for input(s): PROT, ALBUMIN, AST, ALT, ALKPHOS, BILITOT in the last 168 hours. Hematology Recent Labs  Lab 05/26/20 0949  WBC 9.2  RBC 4.94  HGB 14.5  HCT 44.6  MCV 90.3  MCH 29.4  MCHC 32.5  RDW 14.6  PLT 261   Cardiac EnzymesNo results for input(s): TROPONINI in the last 168 hours. No results for input(s): TROPIPOC in the last 168 hours.  BNP Recent Labs  Lab 05/26/20 0949  BNP 231.0*    DDimer No results for input(s): DDIMER in the last 168 hours.  Radiology/Studies:  DG Chest 1 View  Result Date: 05/26/2020 IMPRESSION: Nodular opacity on the right seen lateral to the right hilum on frontal radiograph, measuring 1.8 x 1.4 cm. This lesion is better delineated on recent CT examinations. No edema or airspace opacity. Stable cardiac enlargement. Aortic Atherosclerosis (ICD10-I70.0). Electronically Signed   By: Lowella Grip III M.D.   On: 05/26/2020 10:13    Assessment and Plan:   1.  New onset atrial flutter with RVR: -Converted to junctional rhythm with subsequent sinus rhythm with a 4.1-second posttermination pause at 12:08 PM this afternoon and is currently maintaining sinus rhythm with heart rates in the 60s bpm -With conversion to  sinus rhythm symptoms of chest and abdominal pain have resolved -CHA2DS2-VASc 4 (age x 1, DM, vascular disease, sex category) -Initially, she was scheduled for MRI of the brain given her recent diagnosis of presumed lung cancer.  Until it is confirmed there are no brain mets anticoagulation will be deferred given brief duration of atrial arrhythmia -Continue IV amiodarone overnight with transition to oral amiodarone on 1/11 -Check TSH and magnesium -Replete potassium to goal 4.0 -PTA carvedilol -High-sensitivity troponin normal x2 with nonobstructive cath in 03/2020 -No plans for inpatient ischemic evaluation -Recent echo as outlined above, no indication to repeat  2. CAD involving the native coronary arteries: -Chest pain likely in the setting of atrial flutter with RVR and has resolved with conversion to sinus rhythm -High-sensitivity troponin negative x2 -Recommend PTA aspirin, atorvastatin, carvedilol, losartan -No plans for inpatient ischemic evaluation at this time  3. Moderate to severe aortic stenosis: -Followed by the structural heart team and cardiothoracic surgery -Consider TEE as noted by cardiothoracic surgery in the outpatient setting  4. Pulmonary mass: -Concerning for primary bronchogenic carcinoma with hypermetabolic lesion noted on PET scan -She was initially scheduled for MRI of the brain today though this was unable to be completed given patient's symptoms prompting her to be transported to the ED -Consider completing MRI of the brain prior to discharge if indicated per internal medicine  5. HFpEF: -She appears euvolemic and well compensated -PTA Lasix -Optimal blood pressure -CHF education    For questions or updates, please contact [place] Please consult [email or web address] for contact info under Cardiology/STEMI.   Signed, Christell Faith, PA-C [place] Pager: [phone] 05/26/2020, 12:56 PM

## 2020-05-26 NOTE — ED Notes (Signed)
Pt noted to be in NSR - EKG captured

## 2020-05-26 NOTE — ED Notes (Signed)
Pt unable to void 

## 2020-05-27 ENCOUNTER — Observation Stay
Admit: 2020-05-27 | Discharge: 2020-05-27 | Disposition: A | Discharge status: Home / Self Care | Payer: 59 | Attending: Physician Assistant | Admitting: Physician Assistant

## 2020-05-27 DIAGNOSIS — I10 Essential (primary) hypertension: Secondary | ICD-10-CM | POA: Diagnosis not present

## 2020-05-27 DIAGNOSIS — I483 Typical atrial flutter: Secondary | ICD-10-CM | POA: Diagnosis not present

## 2020-05-27 DIAGNOSIS — I4892 Unspecified atrial flutter: Secondary | ICD-10-CM | POA: Diagnosis not present

## 2020-05-27 DIAGNOSIS — I350 Nonrheumatic aortic (valve) stenosis: Secondary | ICD-10-CM | POA: Diagnosis not present

## 2020-05-27 LAB — BASIC METABOLIC PANEL
Anion gap: 12 (ref 5–15)
BUN: 20 mg/dL (ref 8–23)
CO2: 31 mmol/L (ref 22–32)
Calcium: 9.3 mg/dL (ref 8.9–10.3)
Chloride: 95 mmol/L — ABNORMAL LOW (ref 98–111)
Creatinine, Ser: 0.84 mg/dL (ref 0.44–1.00)
GFR, Estimated: >60 mL/min (ref >60)
Glucose, Bld: 120 mg/dL — ABNORMAL HIGH (ref 70–99)
Potassium: 4.2 mmol/L (ref 3.5–5.1)
Sodium: 138 mmol/L (ref 135–145)

## 2020-05-27 LAB — CBC
HCT: 43.9 % (ref 36.0–46.0)
Hemoglobin: 14.2 g/dL (ref 12.0–15.0)
MCH: 29.6 pg (ref 26.0–34.0)
MCHC: 32.3 g/dL (ref 30.0–36.0)
MCV: 91.5 fL (ref 80.0–100.0)
Platelets: 233 10*3/uL (ref 150–400)
RBC: 4.8 MIL/uL (ref 3.87–5.11)
RDW: 14.6 % (ref 11.5–15.5)
WBC: 8 10*3/uL (ref 4.0–10.5)
nRBC: 0 % (ref 0.0–0.2)

## 2020-05-27 MED ORDER — LOSARTAN POTASSIUM 50 MG PO TABS: 50.0000 mg | ORAL_TABLET | Freq: Every day | ORAL | 0 refills | Status: DC

## 2020-05-27 MED ORDER — AMIODARONE HCL 400 MG PO TABS: 400.0000 mg | ORAL_TABLET | Freq: Two times a day (BID) | ORAL | 0 refills | Status: DC

## 2020-05-27 MED ORDER — LOSARTAN POTASSIUM 50 MG PO TABS
50.0000 mg | ORAL_TABLET | Freq: Every day | ORAL | Status: DC
  Administered 2020-05-27: 50 mg via ORAL
  Filled 2020-05-27: qty 1

## 2020-05-27 MED ORDER — AMIODARONE HCL 200 MG PO TABS
400.0000 mg | ORAL_TABLET | Freq: Two times a day (BID) | ORAL | Status: DC
  Administered 2020-05-27: 400 mg via ORAL
  Filled 2020-05-27: qty 2

## 2020-05-27 NOTE — Discharge Summary (Signed)
Physician Discharge Summary  Kristin [name] JME:[id number] DOB: [birth year] DOA: 05/26/2020  PCP: Patient, No Pcp Per  Admit date: 05/26/2020 Discharge date: 05/27/2020  Admitted From: Home Disposition:  Home  Recommendations for Outpatient Follow-up:  1. Follow up with PCP in 1-2 weeks 2. Please obtain BMP/CBC in one week 3. Please follow up on the following pending results:  Home Health: No Equipment/Devices: Home oxygen, rolling walker Discharge Condition: Stable CODE STATUS: Full Diet recommendation: Heart Healthy / Carb Modified  Brief/Interim Summary: Kristin [name] is a 70 y.o. female with medical history significant for dCHF, DM, copd, smoker, htn, chronic hypoxic respiratory failure on 3 L, OSA not on cpap, hypothyroid, severe aortic stenosis, and non-obstructive CAD, as well as morbid obesity, who presents with chest pain and palpitations.  Patient went to get an outpatient MRI and felt very weak and diaphoretic so MRI was canceled and she was sent to ED. Patient recently had CTA as part of work-up for TAVR for her severe aortic stenosis.CTA showed RLL mass concerning for malignancy. F/u PET scan is suggestive of malignancy. The MRI today was part of her oncology w/u. She was to call oncology today to schedule her first appointment.   On arrival she was found to be atrial flutter with heart rate in 200s.  She was initially started on diltiazem infusion.  She remained in RVR and cardiology was consulted and she was given a bolus and infusion of amiodarone.  Converted back to sinus rhythm.  She was discharged on amiodarone and an event monitor and will follow-up with cardiology as an outpatient.  Cardiology also discontinued her carvedilol due to a pause on telemetry. No anticoagulation was done due to her history of suspected malignancy and melena.  She will continue rest of her home medications and follow-up with her providers.  Discharge Diagnoses:  Principal Problem:   Atrial  flutter with rapid ventricular response (HCC) Active Problems:   Chronic diastolic (congestive) heart failure (HCC)   Type 2 diabetes mellitus without complication (HCC)   COPD (chronic obstructive pulmonary disease) (HCC)   Tobacco use   Obesity, Class III, BMI 40-49.9 (morbid obesity) (HCC)   Chronic respiratory failure with hypoxia (HCC)   OSA (obstructive sleep apnea)   Essential hypertension   Hypothyroidism   Aortic stenosis   Lung cancer [facility])   Discharge Instructions  Discharge Instructions    Diet - low sodium heart healthy   Complete by: As directed    Discharge instructions   Complete by: As directed    It was pleasure taking care of you. Your cardiologist started you on a new medication called amiodarone, please continue taking as directed until you will see them for further recommendations. We discontinue carvedilol and increase the dose of losartan. You are also being provided with event monitor, please follow the directions and follow-up with cardiology in 2 weeks.   Increase activity slowly   Complete by: As directed      Allergies as of 05/27/2020      Reactions   Altace [ramipril] Swelling      Medication List    STOP taking these medications   carvedilol 3.125 MG tablet Commonly known as: COREG     TAKE these medications   albuterol 108 (90 Base) MCG/ACT inhaler Commonly known as: VENTOLIN HFA Inhale 2 puffs into the lungs every 4 (four) hours as needed for wheezing or shortness of breath.   amiodarone 400 MG tablet Commonly known as: PACERONE Take 1 tablet (  400 mg total) by mouth 2 (two) times daily.   aspirin EC 81 MG tablet Take 81 mg by mouth daily.   atorvastatin 80 MG tablet Commonly known as: LIPITOR Take 80 mg by mouth at bedtime.   citalopram 40 MG tablet Commonly known as: CELEXA Take 40 mg by mouth daily.   esomeprazole 40 MG capsule Commonly known as: NEXIUM Take 40 mg by mouth daily.   famotidine 40 MG tablet Commonly  known as: PEPCID Take 40 mg by mouth daily.   fenofibrate 160 MG tablet Take 160 mg by mouth daily.   furosemide 40 MG tablet Commonly known as: LASIX Take 40 mg by mouth daily.   gabapentin 600 MG tablet Commonly known as: NEURONTIN Take 600 mg by mouth 2 (two) times daily.   hydrOXYzine 50 MG capsule Commonly known as: VISTARIL Take 50 mg by mouth every 6 (six) hours as needed for anxiety.   levothyroxine 75 MCG tablet Commonly known as: SYNTHROID Take 75 mcg by mouth daily before breakfast.   losartan 50 MG tablet Commonly known as: COZAAR Take 1 tablet (50 mg total) by mouth daily. What changed:   medication strength  how much to take   Melatonin 10 MG Tabs Take 10 mg by mouth at bedtime as needed (sleep).   metFORMIN 500 MG tablet Commonly known as: GLUCOPHAGE Take 500 mg by mouth 2 (two) times daily.   oxyCODONE-acetaminophen 10-325 MG tablet Commonly known as: PERCOCET Take 1 tablet by mouth 4 (four) times daily as needed for moderate pain or severe pain.   sitaGLIPtin 100 MG tablet Commonly known as: JANUVIA Take 100 mg by mouth daily.   Tiotropium Bromide Monohydrate 2.5 MCG/ACT Aers Inhale 2 puffs into the lungs daily.       Follow-up Information    Gollan, Kathlene November, MD. Schedule an appointment as soon as possible for a visit.   Specialty: Cardiology Contact information: [address] [place] [address] [phone]              Allergies  Allergen Reactions  . Altace [Ramipril] Swelling    Consultations:  Cardiology  Procedures/Studies: DG Chest 1 View  Result Date: 05/26/2020 CLINICAL DATA:  Chest pain EXAM: CHEST  1 VIEW COMPARISON:  March 28, 2020 chest radiograph and chest CT April 08, 2020; PET-CT May 13, 2020 FINDINGS: Mass noted on recent prior CT examinations in the right lower lobe is less well delineated by radiography. It is seen at the hilar level on frontal view measuring 1.8 x 1.4 cm.  Lungs elsewhere are clear. Heart is mildly enlarged with pulmonary vascularity normal. No adenopathy. There is aortic atherosclerosis. No bone lesions. No pneumothorax. IMPRESSION: Nodular opacity on the right seen lateral to the right hilum on frontal radiograph, measuring 1.8 x 1.4 cm. This lesion is better delineated on recent CT examinations. No edema or airspace opacity. Stable cardiac enlargement. Aortic Atherosclerosis (ICD10-I70.0). Electronically Signed   By: Lowella Grip III M.D.   On: 05/26/2020 10:13   MR BRAIN W WO CONTRAST  Result Date: 05/26/2020 CLINICAL DATA:  Lung cancer.  Staging. EXAM: MRI HEAD WITHOUT AND WITH CONTRAST TECHNIQUE: Multiplanar, multiecho pulse sequences of the brain and surrounding structures were obtained without and with intravenous contrast. CONTRAST:  75mL GADAVIST GADOBUTROL 1 MMOL/ML IV SOLN COMPARISON:  None. FINDINGS: Brain: Image quality degraded by mild motion Ventricle size normal. Negative for acute infarct. Negative for hemorrhage or mass. Moderate white matter changes most consistent with  chronic microvascular ischemia. No enhancing metastatic deposits identified. Vascular: Normal arterial flow voids. Skull and upper cervical spine: No focal skeletal lesion. Sinuses/Orbits: Mild mucosal edema paranasal sinuses. Air-fluid level right maxillary sinus. Bilateral cataract extraction Other: None IMPRESSION: Negative for metastatic disease to the brain. Chronic microvascular ischemic change in the white matter without acute infarct. Electronically Signed   By: Franchot Gallo M.D.   On: 05/26/2020 15:38   NM PET Image Initial (PI) Skull Base To Thigh  Result Date: 05/13/2020 CLINICAL DATA:  Initial treatment strategy for lung nodule. EXAM: NUCLEAR MEDICINE PET SKULL BASE TO THIGH TECHNIQUE: 11.9 mCi F-18 FDG was injected intravenously. Full-ring PET imaging was performed from the skull base to thigh after the radiotracer. CT data was obtained and used for  attenuation correction and anatomic localization. Fasting blood glucose: 104 mg/dl COMPARISON:  04/08/2020 CT scan FINDINGS: Mediastinal blood pool activity: SUV max 2.7 Liver activity: SUV max NA Body habitus reduces diagnostic sensitivity and specificity. NECK: No significant abnormal hypermetabolic activity in this region. Incidental CT findings: Bilateral common carotid atherosclerotic calcifications. Chronic right maxillary sinusitis. Hypodense 1.5 by 1.2 cm right inferior thyroid lobe nodule is not substantially more hypermetabolic than the rest of the thyroid gland. Very faint associated calcification. Recommend thyroid US (ref: J Am Coll Radiol. 2015 Feb;12(2): 143-50). CHEST: The right lower lobe nodule is measured at 2.1 by 1.4 cm on image 90 of series 4 and has maximum SUV of 5.4, compatible with malignancy. No appreciable hypermetabolic adenopathy. Incidental CT findings: Coronary, aortic arch, and branch vessel atherosclerotic vascular disease. Mild cardiomegaly. Mitral valve calcification. ABDOMEN/PELVIS: Widespread physiologic activity in bowel. Incidental CT findings: Diffuse hepatic steatosis. Cholelithiasis. Aortoiliac atherosclerotic vascular disease. Hyperdense but photopenic left kidney upper pole exophytic lesion measuring 4.0 by 3.5 cm on image 141 of series 4, appearance favors a complex cyst. SKELETON: Faintly accentuated activity inferiorly along the L2 vertebral body without CT correlate, maximum SUV 6.1, benign etiology favored. Mildly head heterogeneous speckled activity at other vertebral levels. Incidental CT findings: none IMPRESSION: 1. 2.1 by 1.4 cm right lower lobe pulmonary nodule is hypermetabolic with maximum SUV of 5.4, compatible with malignancy. No appreciable nodal or metastatic spread. 2. Mildly accentuated activity along the inferior endplate of L2, probably incidental. 3. Hypodense 1.5 by 1.2 cm right inferior thyroid lobe nodule. Recommend thyroid US (ref: J Am Coll  Radiol. 2015 Feb;12(2): 143-50). 4. Other imaging findings of potential clinical significance: Chronic right maxillary sinusitis. Aortic Atherosclerosis (ICD10-I70.0). Coronary atherosclerosis. Mild cardiomegaly. Mitral valve calcification. Diffuse hepatic steatosis. Cholelithiasis. Complex but photopenic left kidney upper pole cyst, most likely to be benign. Electronically Signed   By: Van Clines M.D.   On: 05/13/2020 16:43     Subjective: Patient was feeling better when seen today.  No new complaint.  Daughter at bedside.  Discussed about discharge and follow-up with cardiology.  Discussed about some changes in her medications as cardiology discontinue her carvedilol and started her on amiodarone.  Discharge Exam: Vitals:   05/27/20 0654 05/27/20 0807  BP: (!) 163/60 (!) 127/57  Pulse: 68 63  Resp: 20 18  Temp:  98.1 F (36.7 C)  SpO2:  96%   Vitals:   05/27/20 0609 05/27/20 0624 05/27/20 0654 05/27/20 0807  BP: (!) 149/65  (!) 163/60 (!) 127/57  Pulse: 79  68 63  Resp: 20  20 18   Temp:  98.4 F (36.9 C)  98.1 F (36.7 C)  TempSrc:  Oral  Oral  SpO2: 93%  96%  Weight:      Height:        General: Pt is alert, awake, not in acute distress Cardiovascular: RRR, S1/S2 +, no rubs, no gallops Respiratory: CTA bilaterally, no wheezing, no rhonchi Abdominal: Soft, NT, ND, bowel sounds + Extremities: no edema, no cyanosis   The results of significant diagnostics from this hospitalization (including imaging, microbiology, ancillary and laboratory) are listed below for reference.    Microbiology: Recent Results (from the past 240 hour(s))  SARS CORONAVIRUS 2 (TAT 6-24 HRS) Nasopharyngeal Nasopharyngeal Swab     Status: None   Collection Time: 05/23/20  1:02 PM   Specimen: Nasopharyngeal Swab  Result Value Ref Range Status   SARS Coronavirus 2 NEGATIVE NEGATIVE Final    Comment: (NOTE) SARS-CoV-2 target nucleic acids are NOT DETECTED.  The SARS-CoV-2 RNA is generally  detectable in upper and lower respiratory specimens during the acute phase of infection. Negative results do not preclude SARS-CoV-2 infection, do not rule out co-infections with other pathogens, and should not be used as the sole basis for treatment or other patient management decisions. Negative results must be combined with clinical observations, patient history, and epidemiological information. The expected result is Negative.  Fact Sheet for Patients: SugarRoll.be  Fact Sheet for Healthcare Providers: https://www.woods-mathews.com/  This test is not yet approved or cleared by the [place] FDA and  has been authorized for detection and/or diagnosis of SARS-CoV-2 by FDA under an Emergency Use Authorization (EUA). This EUA will remain  in effect (meaning this test can be used) for the duration of the COVID-19 declaration under Se ction 564(b)(1) of the Act, 21 U.S.C. section 360bbb-3(b)(1), unless the authorization is terminated or revoked sooner.  Performed at [facility], [place] [address] [place],  [address]   Resp Panel by RT-PCR (Flu A&B, Covid) Nasopharyngeal Swab     Status: None   Collection Time: 05/26/20 11:04 AM   Specimen: Nasopharyngeal Swab; Nasopharyngeal(NP) swabs in vial transport medium  Result Value Ref Range Status   SARS Coronavirus 2 by RT PCR NEGATIVE NEGATIVE Final    Comment: (NOTE) SARS-CoV-2 target nucleic acids are NOT DETECTED.  The SARS-CoV-2 RNA is generally detectable in upper respiratory specimens during the acute phase of infection. The lowest concentration of SARS-CoV-2 viral copies this assay can detect is 138 copies/mL. A negative result does not preclude SARS-Cov-2 infection and should not be used as the sole basis for treatment or other patient management decisions. A negative result may occur with  improper specimen collection/handling, submission of specimen other than  nasopharyngeal swab, presence of viral mutation(s) within the areas targeted by this assay, and inadequate number of viral copies(<138 copies/mL). A negative result must be combined with clinical observations, patient history, and epidemiological information. The expected result is Negative.  Fact Sheet for Patients:  EntrepreneurPulse.com.au  Fact Sheet for Healthcare Providers:  IncredibleEmployment.be  This test is no t yet approved or cleared by the [place] FDA and  has been authorized for detection and/or diagnosis of SARS-CoV-2 by FDA under an Emergency Use Authorization (EUA). This EUA will remain  in effect (meaning this test can be used) for the duration of the COVID-19 declaration under Section 564(b)(1) of the Act, 21 U.S.C.section 360bbb-3(b)(1), unless the authorization is terminated  or revoked sooner.       Influenza A by PCR NEGATIVE NEGATIVE Final   Influenza B by PCR NEGATIVE NEGATIVE Final    Comment: (NOTE) The Xpert Xpress SARS-CoV-2/FLU/RSV plus  assay is intended as an aid in the diagnosis of influenza from Nasopharyngeal swab specimens and should not be used as a sole basis for treatment. Nasal washings and aspirates are unacceptable for Xpert Xpress SARS-CoV-2/FLU/RSV testing.  Fact Sheet for Patients: EntrepreneurPulse.com.au  Fact Sheet for Healthcare Providers: IncredibleEmployment.be  This test is not yet approved or cleared by the [place] FDA and has been authorized for detection and/or diagnosis of SARS-CoV-2 by FDA under an Emergency Use Authorization (EUA). This EUA will remain in effect (meaning this test can be used) for the duration of the COVID-19 declaration under Section 564(b)(1) of the Act, 21 U.S.C. section 360bbb-3(b)(1), unless the authorization is terminated or revoked.  Performed at [facility], [address] [place], [place]  [address]      Labs: BNP (last 3 results) Recent Labs    03/29/20 1003 04/01/20 0906 05/26/20 0949  BNP 251.3* 80.0 643.3*   Basic Metabolic Panel: Recent Labs  Lab 05/23/20 1106 05/26/20 0949 05/26/20 1337 05/27/20 0714  NA 141 138  --  138  K 4.5 3.8  --  4.2  CL 100 95*  --  95*  CO2 34* 30  --  31  GLUCOSE 136* 182*  --  120*  BUN 12 18  --  20  CREATININE 0.79 0.85  --  0.84  CALCIUM 8.5* 9.0  --  9.3  MG  --   --  1.7  --    Liver Function Tests: No results for input(s): AST, ALT, ALKPHOS, BILITOT, PROT, ALBUMIN in the last 168 hours. No results for input(s): LIPASE, AMYLASE in the last 168 hours. No results for input(s): AMMONIA in the last 168 hours. CBC: Recent Labs  Lab 05/26/20 0949 05/27/20 0714  WBC 9.2 8.0  HGB 14.5 14.2  HCT 44.6 43.9  MCV 90.3 91.5  PLT 261 233   Cardiac Enzymes: No results for input(s): CKTOTAL, CKMB, CKMBINDEX, TROPONINI in the last 168 hours. BNP: Invalid input(s): POCBNP CBG: Recent Labs  Lab 05/26/20 1659  GLUCAP 178*   D-Dimer Recent Labs    05/26/20 1334  DDIMER 0.73*   Hgb A1c No results for input(s): HGBA1C in the last 72 hours. Lipid Profile No results for input(s): CHOL, HDL, LDLCALC, TRIG, CHOLHDL, LDLDIRECT in the last 72 hours. Thyroid function studies Recent Labs    05/26/20 1337  TSH 1.133   Anemia work up No results for input(s): VITAMINB12, FOLATE, FERRITIN, TIBC, IRON, RETICCTPCT in the last 72 hours. Urinalysis    Component Value Date/Time   COLORURINE YELLOW (A) 05/26/2020 1001   APPEARANCEUR CLEAR (A) 05/26/2020 1001   LABSPEC 1.017 05/26/2020 1001   PHURINE 5.0 05/26/2020 1001   GLUCOSEU NEGATIVE 05/26/2020 1001   HGBUR NEGATIVE 05/26/2020 1001   BILIRUBINUR NEGATIVE 05/26/2020 1001   KETONESUR NEGATIVE 05/26/2020 1001   PROTEINUR 30 (A) 05/26/2020 1001   NITRITE NEGATIVE 05/26/2020 1001   LEUKOCYTESUR NEGATIVE 05/26/2020 1001   Sepsis Labs Invalid input(s): PROCALCITONIN,   WBC,  LACTICIDVEN Microbiology Recent Results (from the past 240 hour(s))  SARS CORONAVIRUS 2 (TAT 6-24 HRS) Nasopharyngeal Nasopharyngeal Swab     Status: None   Collection Time: 05/23/20  1:02 PM   Specimen: Nasopharyngeal Swab  Result Value Ref Range Status   SARS Coronavirus 2 NEGATIVE NEGATIVE Final    Comment: (NOTE) SARS-CoV-2 target nucleic acids are NOT DETECTED.  The SARS-CoV-2 RNA is generally detectable in upper and lower respiratory specimens during the acute phase of infection. Negative  results do not preclude SARS-CoV-2 infection, do not rule out co-infections with other pathogens, and should not be used as the sole basis for treatment or other patient management decisions. Negative results must be combined with clinical observations, patient history, and epidemiological information. The expected result is Negative.  Fact Sheet for Patients: SugarRoll.be  Fact Sheet for Healthcare Providers: https://www.woods-mathews.com/  This test is not yet approved or cleared by the [place] FDA and  has been authorized for detection and/or diagnosis of SARS-CoV-2 by FDA under an Emergency Use Authorization (EUA). This EUA will remain  in effect (meaning this test can be used) for the duration of the COVID-19 declaration under Se ction 564(b)(1) of the Act, 21 U.S.C. section 360bbb-3(b)(1), unless the authorization is terminated or revoked sooner.  Performed at [facility], [place] [address], [place], [place] [address]   Resp Panel by RT-PCR (Flu A&B, Covid) Nasopharyngeal Swab     Status: None   Collection Time: 05/26/20 11:04 AM   Specimen: Nasopharyngeal Swab; Nasopharyngeal(NP) swabs in vial transport medium  Result Value Ref Range Status   SARS Coronavirus 2 by RT PCR NEGATIVE NEGATIVE Final    Comment: (NOTE) SARS-CoV-2 target nucleic acids are NOT DETECTED.  The SARS-CoV-2 RNA is generally detectable in upper  respiratory specimens during the acute phase of infection. The lowest concentration of SARS-CoV-2 viral copies this assay can detect is 138 copies/mL. A negative result does not preclude SARS-Cov-2 infection and should not be used as the sole basis for treatment or other patient management decisions. A negative result may occur with  improper specimen collection/handling, submission of specimen other than nasopharyngeal swab, presence of viral mutation(s) within the areas targeted by this assay, and inadequate number of viral copies(<138 copies/mL). A negative result must be combined with clinical observations, patient history, and epidemiological information. The expected result is Negative.  Fact Sheet for Patients:  EntrepreneurPulse.com.au  Fact Sheet for Healthcare Providers:  IncredibleEmployment.be  This test is no t yet approved or cleared by the [place] FDA and  has been authorized for detection and/or diagnosis of SARS-CoV-2 by FDA under an Emergency Use Authorization (EUA). This EUA will remain  in effect (meaning this test can be used) for the duration of the COVID-19 declaration under Section 564(b)(1) of the Act, 21 U.S.C.section 360bbb-3(b)(1), unless the authorization is terminated  or revoked sooner.       Influenza A by PCR NEGATIVE NEGATIVE Final   Influenza B by PCR NEGATIVE NEGATIVE Final    Comment: (NOTE) The Xpert Xpress SARS-CoV-2/FLU/RSV plus assay is intended as an aid in the diagnosis of influenza from Nasopharyngeal swab specimens and should not be used as a sole basis for treatment. Nasal washings and aspirates are unacceptable for Xpert Xpress SARS-CoV-2/FLU/RSV testing.  Fact Sheet for Patients: EntrepreneurPulse.com.au  Fact Sheet for Healthcare Providers: IncredibleEmployment.be  This test is not yet approved or cleared by the [place] FDA and has been  authorized for detection and/or diagnosis of SARS-CoV-2 by FDA under an Emergency Use Authorization (EUA). This EUA will remain in effect (meaning this test can be used) for the duration of the COVID-19 declaration under Section 564(b)(1) of the Act, 21 U.S.C. section 360bbb-3(b)(1), unless the authorization is terminated or revoked.  Performed at [facility], [address], [place], [place] [address]     Time coordinating discharge: Over 30 minutes  SIGNED:  Lorella Nimrod, MD  [facility] 05/27/2020, 11:36 AM  If 7PM-7AM, please contact night-coverage  [email or web address]  This record has been created using Systems analyst. Errors have been sought and corrected,but may not always be located. Such creation errors do not reflect on the standard of care.

## 2020-05-27 NOTE — Consult Note (Signed)
   Heart Failure Nurse Navigator Note  HFpEF of 55 to 60%.  Mild LVH.  Grade 1 diastolic dysfunction.  Mild left atrial enlargement.  Moderate to severe aortic stenosis with aortic valve area of 1.1 cm.  Normal right ventricular systolic function.   She presented from home with complaints of chest pain that radiated down her arm.  EKG revealed atrial flutter.  EKG's revealed atrial flutter with a ventricular rate in the 120s and 130s.  She was started on amiodarone.  With converting from atrial flutter to junctional rhythm there was noted to have a 4.1-second pause.  Recent CTA of the chest revealed right lower lobe mass questionable malignancy.     Comorbidities: Diabetes COPD Hypertension Obstructive sleep apnea not using CPAP Coronary artery disease-nonobstructive by Catheterization 04/02/2020 Morbid obesity Anxiety     Medications:  Amiodarone 400 mg 2 times a day Atorvastatin 80 mg at bedtime Fenofibrate 160 mg daily Lasix 40 mg p.o. daily Cozaar 50 mg daily NicoDerm patch daily  Not on beta-blocker due to 4.1-second pause  Allergy ACE inhibitors-ramipril causes of swelling.   Labs: Sodium 138, potassium 4.2, chloride 95, CO2 31, BUN 20, creatinine 0.84, hemoglobin 14.2 hematocrit 43.9.  BNP on admission 231 Intake 79 mL  output 400 mL Weight 104.3 kg BMI 42 Blood pressure 127/57    Assessment:  General-she is awake and alert lying in bed watching television with daughter at the bedside.  HEENT- pupils are equal, edentulous  Cardiac-heart tones of regular rate and rhythm with 2/6 systolic murmur best heard in the right upper sternal border.   Chest- scattered expiratory faint wheeze.  Abdomen-obese, rounded soft nontender   Musculoskeletal-lower extremities there is no edema noted.  Psych-is pleasant and appropriate, makes good eye contact.  Neuro- speech is clear, she moves all extremities without difficulty.    Initial visit with patient  and her daughter whom she lives with.  She states that they try to eat healthy but do eat things like microwave popcorn, getting meals from local both a which the patient states  they do taste salty.  Discussed if they do get food from the restaurant ask if they could be supplied with the sodium content.  Discussed keeping a daily diary of her sodium intake.  She does weigh herself daily but does not record.  Talked about what to report to physician-2 to 3 pound weight gain overnight or 5 pounds within a week.  Increased swelling, and changes in symptoms.  Also discussed fluid intake, states that her mother likes to drink energy drinks that are loaded with caffeine.  Recommend that they abstain from a power drink due to caffeine content, fascially with a new diagnosis of atrial flutter.  Made her and her daughter aware of the heart failure outpatient clinic.  She was supplied with information about the clinic.  Also given heart failure teaching booklet along with his zone magnet.   She was a Research officer, trade [employer]

## 2020-05-27 NOTE — Progress Notes (Signed)
Progress Note  Patient Name: Kristin [name] Date of Encounter: 05/27/2020  Primary Cardiologist: [place] well, no chest pain, palpitations, dyspnea, dizziness, presyncope, or syncope. No hemoptysis or hematochezia. She has noted some sticky stools over the past week. Currently on baseline 3 L supplemental oxygen via nasal cannula. Maintaining sinus rhythm.   Inpatient Medications    Scheduled Meds: . atorvastatin  80 mg Oral QHS  . carvedilol  3.125 mg Oral BID WC  . citalopram  40 mg Oral Daily  . enoxaparin (LOVENOX) injection  0.5 mg/kg Subcutaneous Q24H  . fenofibrate  160 mg Oral Daily  . furosemide  40 mg Oral Daily  . gabapentin  600 mg Oral BID  . insulin aspart  0-15 Units Subcutaneous TID WC  . levothyroxine  75 mcg Oral QAC breakfast  . losartan  25 mg Oral Daily  . nicotine  21 mg Transdermal Daily  . pantoprazole  40 mg Oral Daily  . sodium chloride flush  3 mL Intravenous Q12H  . tiotropium  18 mcg Inhalation Daily   Continuous Infusions: . sodium chloride    . amiodarone 30 mg/hr (05/26/20 1656)   PRN Meds: sodium chloride, albuterol, hydrOXYzine, oxyCODONE-acetaminophen, sodium chloride flush   Vital Signs    Vitals:   05/27/20 0600 05/27/20 0609 05/27/20 0624 05/27/20 0654  BP: (!) 149/65 (!) 149/65  (!) 163/60  Pulse: 79 79  68  Resp: 15 20  20   Temp:   98.4 F (36.9 C)   TempSrc:   Oral   SpO2:  93%    Weight:      Height:        Intake/Output Summary (Last 24 hours) at 05/27/2020 0709 Last data filed at 05/26/2020 1749 Gross per 24 hour  Intake 79.45 ml  Output 400 ml  Net -320.55 ml   Filed Weights   05/26/20 0947  Weight: 104.3 kg    Telemetry    SR - Personally Reviewed  ECG    No new tracings - Personally Reviewed  Physical Exam   GEN: No acute distress.   Neck: No JVD. Cardiac: RRR, III/VI systolic murmur RUSB, no rubs, or gallops.  Respiratory: Diminished and coarse breath sounds bilaterally.   GI: Soft, nontender, non-distended.   MS: No edema; No deformity. Neuro:  Alert and oriented x 3; Nonfocal.  Psych: Normal affect.  Labs    Chemistry Recent Labs  Lab 05/23/20 1106 05/26/20 0949  NA 141 138  K 4.5 3.8  CL 100 95*  CO2 34* 30  GLUCOSE 136* 182*  BUN 12 18  CREATININE 0.79 0.85  CALCIUM 8.5* 9.0  GFRNONAA >60 >60  ANIONGAP 7 13     Hematology Recent Labs  Lab 05/26/20 0949  WBC 9.2  RBC 4.94  HGB 14.5  HCT 44.6  MCV 90.3  MCH 29.4  MCHC 32.5  RDW 14.6  PLT 261    Cardiac EnzymesNo results for input(s): TROPONINI in the last 168 hours. No results for input(s): TROPIPOC in the last 168 hours.   BNP Recent Labs  Lab 05/26/20 0949  BNP 231.0*     DDimer  Recent Labs  Lab 05/26/20 1334  DDIMER 0.73*     Radiology    DG Chest 1 View  Result Date: 05/26/2020 IMPRESSION: Nodular opacity on the right seen lateral to the right hilum on frontal radiograph, measuring 1.8 x 1.4 cm. This lesion is better delineated on recent CT examinations.  No edema or airspace opacity. Stable cardiac enlargement. Aortic Atherosclerosis (ICD10-I70.0). Electronically Signed   By: Lowella Grip III M.D.   On: 05/26/2020 10:13   MR BRAIN W WO CONTRAST  Result Date: 05/26/2020 IMPRESSION: Negative for metastatic disease to the brain. Chronic microvascular ischemic change in the white matter without acute infarct. Electronically Signed   By: Franchot Gallo M.D.   On: 05/26/2020 15:38    Cardiac Studies   2D echo 07/05/2018: 1. The left ventricle has normal systolic function with an ejection  fraction of 60-65%. The cavity size was normal. Left ventricular diastolic  parameters were normal.  2. The right ventricle has normal systolic function. The cavity was  normal. There is no increase in right ventricular wall thickness.  3. Left atrial size was mildly dilated.  4. The aortic valve has an indeterminant number of cusps Severe  calcifcation of the  aortic valve. moderate stenosis of the aortic valve.  5. The inferior vena cava was dilated in size with >50% respiratory  variability.  6. Unable to estimate RVSP  __________  2D echo 03/29/2020: 1. Left ventricular ejection fraction, by estimation, is 55 to 60%. The  left ventricle has normal function. The left ventricle has no regional  wall motion abnormalities. There is mild left ventricular hypertrophy.  Left ventricular diastolic parameters  are consistent with Grade I diastolic dysfunction (impaired relaxation).  2. Right ventricular systolic function is normal. The right ventricular  size is normal. Tricuspid regurgitation signal is inadequate for assessing  PA pressure.  3. Left atrial size was mildly dilated.  4. The aortic valve was not well visualized. Moderate to severe aortic  valve stenosis. Aortic valve area, by VTI measures 1.11 cm. Aortic valve  mean gradient measures 31.0 mmHg. Aortic valve Vmax measures 3.66 m/s. __________  [facility] 03/31/2020:  Prox RCA lesion is 30% stenosed.  Mid RCA lesion is 40% stenosed.  RPDA lesion is 30% stenosed.  Prox Cx lesion is 60% stenosed.  Mid LAD lesion is 20% stenosed.  Mid Cx to Dist Cx lesion is 20% stenosed.  1. Mild to moderate nonobstructive coronary artery disease. Worst stenosis is 60% in the proximal left circumflex. No evidence of obstructive disease. Coronary arteries are overall moderately calcified. 2. Right heart catheterization showed moderately elevated left-sided filling pressures, moderate pulmonary hypertension and moderately reduced cardiac output. 3. Severe aortic stenosis with mean gradient of 31 mmHg and calculated valve area of 0.7 cm.  Recommendations: The patient is significantly volume overloaded. I switched furosemide to intravenous 20 mg twice daily. I increased carvedilol for better blood pressure control. Recommend medical therapy for nonobstructive coronary artery  disease. Recommend outpatient TAVR evaluation.  Patient Profile     70 y.o. 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  Assessment & Plan    1. New onset atrial flutter with RVR: -Converted to junctional rhythm with subsequent sinus rhythm with a 4.1-second posttermination pause at 12:08 PM this afternoon and continues to maintain sinus rhythm with heart rates in the 60s bpm -With conversion to sinus rhythm symptoms of chest and abdominal pain have resolved -CHA2DS2-VASc 4 (age x 1, DM, vascular disease, sex category) -Initially, anticoagulation was deferred until it was demonstrated there was no evidence of metastatic disease on MRI of the brain, this was performed on 1/10 and  showed no evidence of metastatic disease  -Given her episode of atrial flutter lasted < 24 hours, and in the setting of her comorbid conditions with high bleeding risk, OAC is deferred at this time -Transition amiodarone gtt to oral amiodarone 400 mg bid x 7 days, then 200 mg bid x 7 days, then 200 mg daily thereafter -Place Zio patch at discharge to evaluate for atrial arrhythmia burden, if there is recurrence of arrhythmia we will need to reconsider [place] and possibly refer her to EP -TSH and magnesium normal  -Replete potassium to goal 4.0, pending this morning -Hold PTA carvedilol given post termination pause  -High-sensitivity troponin normal x2 with nonobstructive cath in 03/2020 -No plans for inpatient ischemic evaluation -Recent echo as outlined above, no indication to repeat  2. CAD involving the native coronary arteries: -Chest pain likely in the setting of atrial flutter with RVR and has resolved with conversion to sinus rhythm -High-sensitivity troponin negative x2 -Recommend PTA aspirin,  atorvastatin, carvedilol, losartan -No plans for inpatient ischemic evaluation at this time  3. Moderate to severe aortic stenosis: -Followed by the structural heart team and cardiothoracic surgery -Consider TEE as noted by cardiothoracic surgery in the outpatient setting  4. Pulmonary mass: -Concerning for primary bronchogenic carcinoma with hypermetabolic lesion noted on PET scan -She was initially scheduled for MRI of the brain today though this was unable to be completed given patient's symptoms prompting her to be transported to the ED -MRI brain negative for metastatic disease   5. HFpEF: -She appears euvolemic and well compensated -PTA Lasix -Optimal blood pressure -CHF education  6. HTN: -Stop Coreg with post termination pause -Titrate losartan -Lasix  Dispo: -She may be discharged on current cardiac medications once her Zio patch has been placed. We will arrange hospital follow up.   For questions or updates, please contact [place] Please consult [email or web address] for contact info under Cardiology/STEMI.    Signed, Christell Faith, PA-C [facility] Pager: [phone] 05/27/2020, 7:09 AM

## 2020-05-28 ENCOUNTER — Emergency Department
Admission: EM | Admit: 2020-05-28 | Discharge: 2020-05-28 | Disposition: A | Discharge status: Home / Self Care | Payer: 59 | Attending: Emergency Medicine | Admitting: Emergency Medicine

## 2020-05-28 ENCOUNTER — Other Ambulatory Visit: Payer: Self-pay | Admitting: Radiology

## 2020-05-28 ENCOUNTER — Encounter: Payer: Self-pay | Admitting: *Deleted

## 2020-05-28 ENCOUNTER — Telehealth: Payer: Self-pay | Admitting: Cardiovascular Disease

## 2020-05-28 DIAGNOSIS — E1140 Type 2 diabetes mellitus with diabetic neuropathy, unspecified: Secondary | ICD-10-CM | POA: Insufficient documentation

## 2020-05-28 DIAGNOSIS — Z7984 Long term (current) use of oral hypoglycemic drugs: Secondary | ICD-10-CM | POA: Diagnosis not present

## 2020-05-28 DIAGNOSIS — J449 Chronic obstructive pulmonary disease, unspecified: Secondary | ICD-10-CM | POA: Diagnosis not present

## 2020-05-28 DIAGNOSIS — Z7982 Long term (current) use of aspirin: Secondary | ICD-10-CM | POA: Insufficient documentation

## 2020-05-28 DIAGNOSIS — I480 Paroxysmal atrial fibrillation: Secondary | ICD-10-CM | POA: Diagnosis not present

## 2020-05-28 DIAGNOSIS — R002 Palpitations: Secondary | ICD-10-CM | POA: Diagnosis present

## 2020-05-28 DIAGNOSIS — J45909 Unspecified asthma, uncomplicated: Secondary | ICD-10-CM | POA: Insufficient documentation

## 2020-05-28 DIAGNOSIS — I5032 Chronic diastolic (congestive) heart failure: Secondary | ICD-10-CM | POA: Diagnosis not present

## 2020-05-28 DIAGNOSIS — Z79899 Other long term (current) drug therapy: Secondary | ICD-10-CM | POA: Diagnosis not present

## 2020-05-28 DIAGNOSIS — R0789 Other chest pain: Secondary | ICD-10-CM

## 2020-05-28 DIAGNOSIS — F17210 Nicotine dependence, cigarettes, uncomplicated: Secondary | ICD-10-CM | POA: Diagnosis not present

## 2020-05-28 DIAGNOSIS — I4892 Unspecified atrial flutter: Secondary | ICD-10-CM

## 2020-05-28 DIAGNOSIS — E039 Hypothyroidism, unspecified: Secondary | ICD-10-CM | POA: Diagnosis not present

## 2020-05-28 DIAGNOSIS — I110 Hypertensive heart disease with heart failure: Secondary | ICD-10-CM | POA: Insufficient documentation

## 2020-05-28 LAB — BASIC METABOLIC PANEL
Anion gap: 12 (ref 5–15)
BUN: 23 mg/dL (ref 8–23)
CO2: 32 mmol/L (ref 22–32)
Calcium: 9.5 mg/dL (ref 8.9–10.3)
Chloride: 94 mmol/L — ABNORMAL LOW (ref 98–111)
Creatinine, Ser: 1.07 mg/dL — ABNORMAL HIGH (ref 0.44–1.00)
GFR, Estimated: 57 mL/min — ABNORMAL LOW (ref >60)
Glucose, Bld: 133 mg/dL — ABNORMAL HIGH (ref 70–99)
Potassium: 4 mmol/L (ref 3.5–5.1)
Sodium: 138 mmol/L (ref 135–145)

## 2020-05-28 LAB — MAGNESIUM: Magnesium: 1.7 mg/dL (ref 1.7–2.4)

## 2020-05-28 MED ORDER — AMIODARONE HCL 200 MG PO TABS
400.0000 mg | ORAL_TABLET | Freq: Once | ORAL | Status: AC
  Administered 2020-05-28: 400 mg via ORAL
  Filled 2020-05-28: qty 2

## 2020-05-28 NOTE — Discharge Instructions (Signed)
As we discussed, your symptoms are likely due to you not having your amiodarone for 1 day.  We gave you 1 pill of amiodarone here in the ED at about 5:00. Take another tablet later tonight before you go to bed, and then resume on a normal twice daily schedule in the morning.  If you develop any recurrent or worsening symptoms, please return to the ED.

## 2020-05-28 NOTE — Telephone Encounter (Signed)
With recurrent Afib noted on cardiac monitoring, and in the setting of low bleeding risk with a HAS-BLED score of 1, leading to a 3.4% risk of bleeding, OAC should be considered.   Recommendations: -Stop aspirin  -Start Eliquis 5 mg bid (does not meet reduced dosing criteria) -Check CBC and BMET 1 week after starting anticoagulation  -Continue to load with amiodarone as discussed in the hospital -Keep follow up appointment next week -If her Afib burden persists on amiodarone, following appropriate loading, or if she has recurrent pauses, would have her see EP for further recommendations  Discussed with Dr. Saunders Revel

## 2020-05-28 NOTE — Telephone Encounter (Signed)
[facility] calling in with critical result

## 2020-05-28 NOTE — ED Triage Notes (Signed)
Was discharge from [facility] yesterday (admit for afib) and today her symptoms returned.  Pt called ems who placed an IV and gave 2.5 metoprolol IV due to afib in 160-202 and after metoprolol CP was reduced and less sob and felt better, afib 80-130 after metoprolol

## 2020-05-28 NOTE — ED Notes (Signed)
Pt arrived a-fib in 80'2 -120's and converted to NSR before I was able to do EKG>  Pt feels better, EDP has been in and spoken with her

## 2020-05-28 NOTE — Telephone Encounter (Signed)
Received a critical report from [place] with  [employer]. At 1546 today she had a 1st documented run of Afib with bpm ranging from 153-208 for 90 secs, with an average rate of 192. Then a Rapid Afib rate of 197 bpm lasting 60 secs. Will route to DOD (Dr. Saunders Revel) and ordering provider Christell Faith, NP.

## 2020-05-28 NOTE — ED Provider Notes (Signed)
[facility] Emergency Department Provider Note ____________________________________________   Event Date/Time   First MD Initiated Contact with Patient 05/28/20 1648     (approximate)  I have reviewed the triage vital signs and the nursing notes.  HISTORY  Chief Complaint Atrial Fibrillation   HPI Kristin [name] is a 70 y.o. femalewho presents to the ED for evaluation of palpitations and A. fib.  Chart review indicates patient was just discharged yesterday from an overnight admission for new onset atrial fibrillation.  Patient was on Coreg for antihypertensives, and transitioned off of this to oral amiodarone.  Patient reports that she has not been able to fill her oral amiodarone prescription due to the pharmacy "being backed up."  She reports that that her prescription was supposed to be ready later this afternoon.  She has not been able to take any oral amiodarone since she was discharged yesterday.  Patient reports feeling "okay" since discharge.  She reports being seated watching television, when she suddenly developed heart palpitations and associated chest discomfort.  She denies any syncopal episodes, emesis, abdominal pain.  She reports EMS arrived about 30 minutes later, finding her tachycardic with rates in the 170s.  They provided 2.5 mg of IV metoprolol with transition to sinus rhythm upon arrival to the ED.  She reports feeling better now and denies complaints.   Past Medical History:  Diagnosis Date  . Anxiety   . Asthma   . CHF (congestive heart failure) ([place])    2018  . COPD (chronic obstructive pulmonary disease) ([place])   . Diabetes mellitus without complication ([place])   . Dyspnea   . GERD (gastroesophageal reflux disease)   . History of hiatal hernia   . History of orthopnea   . Hypothyroidism   . Neuropathy   . Oxygen deficiency    3L/HS  . Pain    BACK/DDD  . Peripheral vascular disease ([place])   . RLS (restless legs syndrome)   .  Sleep apnea   . Wheezing     Patient Active Problem List   Diagnosis Date Noted  . Lung cancer ([place]) 05/26/2020  . Atrial flutter with rapid ventricular response ([place]) 05/26/2020  . Demand ischemia ([place])   . Acute on chronic heart failure with preserved ejection fraction (HFpEF) ([place])   . Aortic stenosis   . Obesity, Class III, BMI 40-49.9 (morbid obesity) ([place]) 03/29/2020  . Chronic respiratory failure with hypoxia ([place]) 03/29/2020  . OSA (obstructive sleep apnea) 03/29/2020  . Essential hypertension 03/29/2020  . Hypothyroidism 03/29/2020  . Chest pain 03/29/2020  . Elevated troponin 03/29/2020  . Chronic diastolic (congestive) heart failure ([place]) 07/12/2018  . Type 2 diabetes mellitus without complication ([place]) 15/17/6160  . COPD (chronic obstructive pulmonary disease) ([place]) 07/12/2018  . Tobacco use 07/12/2018  . Hypotension 07/12/2018  . Respiratory failure ([place]) 07/04/2018    Past Surgical History:  Procedure Laterality Date  . BREAST SURGERY    . CATARACT EXTRACTION W/PHACO Right 03/02/2018   Procedure: CATARACT EXTRACTION PHACO AND INTRAOCULAR LENS PLACEMENT ([place]);  Surgeon: Marchia Meiers, MD;  Location: [facility] ORS;  Service: Ophthalmology;  Laterality: Right;  [place] 01:15 CDE 16.46 Fluid pack lot # [id number]  . CATARACT EXTRACTION W/PHACO Left 04/27/2018   Procedure: CATARACT EXTRACTION PHACO AND INTRAOCULAR LENS PLACEMENT ([place])- LEFT DIABETIC;  Surgeon: Marchia Meiers, MD;  Location: [facility] ORS;  Service: Ophthalmology;  Laterality: Left;  Lot # [id number] H [place]: 00:46.3 CDE: 8.07   . CYST EXCISION  FOREHEAD  . FOOT SURGERY     CYST  . RIGHT/LEFT HEART CATH AND CORONARY ANGIOGRAPHY N/A 03/31/2020   Procedure: RIGHT/LEFT HEART CATH AND CORONARY ANGIOGRAPHY;  Surgeon: Wellington Hampshire, MD;  Location: [facility];  Service: Cardiovascular;  Laterality: N/A;  . TUBAL LIGATION      Prior to Admission medications   Medication Sig Start Date End Date Taking?  Authorizing Provider  albuterol (VENTOLIN HFA) 108 (90 Base) MCG/ACT inhaler Inhale 2 puffs into the lungs every 4 (four) hours as needed for wheezing or shortness of breath.     [provider]  amiodarone (PACERONE) 400 MG tablet Take 1 tablet (400 mg total) by mouth 2 (two) times daily. 05/27/20   Lorella Nimrod, MD  aspirin EC 81 MG tablet Take 81 mg by mouth daily.    [provider]  atorvastatin (LIPITOR) 80 MG tablet Take 80 mg by mouth at bedtime.  09/05/19   [provider]  citalopram (CELEXA) 40 MG tablet Take 40 mg by mouth daily.    [provider]  esomeprazole (NEXIUM) 40 MG capsule Take 40 mg by mouth daily. 10/01/19   [provider]  famotidine (PEPCID) 40 MG tablet Take 40 mg by mouth daily.    [provider]  fenofibrate 160 MG tablet Take 160 mg by mouth daily.    [provider]  furosemide (LASIX) 40 MG tablet Take 40 mg by mouth daily.    [provider]  gabapentin (NEURONTIN) 600 MG tablet Take 600 mg by mouth 2 (two) times daily.  11/20/19   [provider]  hydrOXYzine (VISTARIL) 50 MG capsule Take 50 mg by mouth every 6 (six) hours as needed for anxiety. 03/14/20   [provider]  levothyroxine (SYNTHROID, LEVOTHROID) 75 MCG tablet Take 75 mcg by mouth daily before breakfast.    [provider]  losartan (COZAAR) 50 MG tablet Take 1 tablet (50 mg total) by mouth daily. 05/27/20   Lorella Nimrod, MD  Melatonin 10 MG TABS Take 10 mg by mouth at bedtime as needed (sleep).    [provider]  metFORMIN (GLUCOPHAGE) 500 MG tablet Take 500 mg by mouth 2 (two) times daily. 09/19/19   [provider]  oxyCODONE-acetaminophen (PERCOCET) 10-325 MG tablet Take 1 tablet by mouth 4 (four) times daily as needed for moderate pain or severe pain.    [provider]  sitaGLIPtin (JANUVIA) 100 MG tablet Take 100 mg by mouth daily.    [provider]  Tiotropium  Bromide Monohydrate 2.5 MCG/ACT AERS Inhale 2 puffs into the lungs daily.     [provider]    Allergies Altace [ramipril]  Family History  Problem Relation Age of Onset  . Heart disease Mother   . Cancer Father     Social History Social History   Tobacco Use  . Smoking status: Current Every Day Smoker    Packs/day: 1.00    Years: 50.00    Pack years: 50.00    Types: Cigarettes  . Smokeless tobacco: Never Used  Substance Use Topics  . Alcohol use: Not Currently    Review of Systems  Constitutional: No fever/chills Eyes: No visual changes. ENT: No sore throat. Cardiovascular: Positive for palpitations and chest discomfort. Respiratory: Denies shortness of breath. Gastrointestinal: No abdominal pain.  No nausea, no vomiting.  No diarrhea.  No constipation. Genitourinary: Negative for dysuria. Musculoskeletal: Negative for back pain. Skin: Negative for rash. Neurological: Negative for  headaches, focal weakness or numbness.  ____________________________________________   PHYSICAL EXAM:  VITAL SIGNS: Vitals:   05/28/20 1655 05/28/20 1700  BP: 120/70 114/66  Pulse: 97 98  Resp: (!) 21 17  Temp: 98.8 F (37.1 C)   SpO2: 99% 98%     Constitutional: Alert and oriented. Well appearing and in no acute distress.  Morbidly obese.  Conversational in full sentences. Eyes: Conjunctivae are normal. PERRL. EOMI. Head: Atraumatic. Nose: No congestion/rhinnorhea. Mouth/Throat: Mucous membranes are moist.  Oropharynx non-erythematous. Neck: No stridor. No cervical spine tenderness to palpation. Cardiovascular: Normal rate, regular rhythm. Grossly normal heart sounds.  Good peripheral circulation. Respiratory: Normal respiratory effort.  No retractions. Lungs CTAB. Gastrointestinal: Soft , nondistended, nontender to palpation. No CVA tenderness. Musculoskeletal: No lower extremity tenderness nor edema.  No joint effusions. No signs of acute trauma. Neurologic:   Normal speech and language. No gross focal neurologic deficits are appreciated. No gait instability noted. Skin:  Skin is warm, dry and intact. No rash noted. Psychiatric: Mood and affect are normal. Speech and behavior are normal.  ____________________________________________   LABS (all labs ordered are listed, but only abnormal results are displayed)  Labs Reviewed  BASIC METABOLIC PANEL - Abnormal; Notable for the following components:      Result Value   Chloride 94 (*)    Glucose, Bld 133 (*)    Creatinine, Ser 1.07 (*)    GFR, Estimated 57 (*)    All other components within normal limits  MAGNESIUM   ____________________________________________  12 Lead EKG  Sinus rhythm, rate of 96 bpm.  Normal axis.  Normal intervals.  No evidence of acute ischemia. ____________________________________________   PROCEDURES and INTERVENTIONS  Procedure(s) performed (including Critical Care):  .1-3 Lead EKG Interpretation Performed by: Vladimir Crofts, MD Authorized by: Vladimir Crofts, MD     Interpretation: normal     ECG rate:  90   ECG rate assessment: normal     Rhythm: sinus rhythm     Ectopy: none     Conduction: normal      Medications  amiodarone (PACERONE) tablet 400 mg (400 mg Oral Given 05/28/20 1724)    ____________________________________________   MDM / ED COURSE   70 year old woman recently diagnosed with A. fib and started on amiodarone, presented to the ED with symptomatic A. fib in the setting of her not being to pick up her outpatient prescription yet, and ultimately amenable to outpatient management.  Twelve-lead and telemetry strips from EMS reviewed with rapid A. fib, but she is in a sinus rhythm here in the ED and maintains normal vital signs on her home O2.  Exam with an obese patient without evidence of acute derangements.  She reports that she has been unable to fill her amiodarone prescription yet due to pharmacy delays, but her son in law is able to  pick up that prescription while she is here in the ED.  We will provide her with 1 tablet of amiodarone and observe for about 1.5 hours without recurrence of A. fib with RVR.  EKG shows normal sinus rhythm without ischemic features.  No indications for further work-up at this time.  We discussed return precautions for the ED and compliance with amiodarone.   Clinical Course as of 05/28/20 1807  Wed May 28, 2020  1805 Reassessed.  Patient reports that she feels well.  She reports that her son-in-law has already picked up her amiodarone prescription, and that her daughter can come and pick her up now.  We discussed return precautions for the ED and outpatient management. [DS]    Clinical Course User Index [DS] Vladimir Crofts, MD    ____________________________________________   FINAL CLINICAL IMPRESSION(S) / ED DIAGNOSES  Final diagnoses:  Other chest pain  Paroxysmal atrial fibrillation [facility])     ED Discharge Orders    None       Willye Javier   Note:  This document was prepared using Dragon voice recognition software and may include unintentional dictation errors.   Vladimir Crofts, MD 05/28/20 [phone]

## 2020-05-29 ENCOUNTER — Ambulatory Visit (HOSPITAL_COMMUNITY)
Admission: RE | Admit: 2020-05-29 | Discharge: 2020-05-29 | Disposition: A | Discharge status: Home / Self Care | Payer: 59 | Source: Ambulatory Visit | Attending: Thoracic Surgery (Cardiothoracic Vascular Surgery) | Admitting: Thoracic Surgery (Cardiothoracic Vascular Surgery)

## 2020-05-29 ENCOUNTER — Encounter (HOSPITAL_COMMUNITY): Payer: Self-pay

## 2020-05-29 ENCOUNTER — Ambulatory Visit (HOSPITAL_COMMUNITY): Payer: 59

## 2020-05-29 ENCOUNTER — Other Ambulatory Visit: Payer: Self-pay

## 2020-05-29 DIAGNOSIS — R911 Solitary pulmonary nodule: Secondary | ICD-10-CM | POA: Diagnosis present

## 2020-05-29 DIAGNOSIS — I350 Nonrheumatic aortic (valve) stenosis: Secondary | ICD-10-CM | POA: Insufficient documentation

## 2020-05-29 LAB — CBC
HCT: 38.3 % (ref 36.0–46.0)
Hemoglobin: 12.4 g/dL (ref 12.0–15.0)
MCH: 30 pg (ref 26.0–34.0)
MCHC: 32.4 g/dL (ref 30.0–36.0)
MCV: 92.7 fL (ref 80.0–100.0)
Platelets: 213 10*3/uL (ref 150–400)
RBC: 4.13 MIL/uL (ref 3.87–5.11)
RDW: 14.6 % (ref 11.5–15.5)
WBC: 8.3 10*3/uL (ref 4.0–10.5)
nRBC: 0 % (ref 0.0–0.2)

## 2020-05-29 LAB — GLUCOSE, CAPILLARY: Glucose-Capillary: 112 mg/dL — ABNORMAL HIGH (ref 70–99)

## 2020-05-29 LAB — PROTIME-INR
INR: 1 (ref 0.8–1.2)
Prothrombin Time: 12.8 seconds (ref 11.4–15.2)

## 2020-05-29 MED ORDER — SODIUM CHLORIDE 0.9 % IV SOLN: INTRAVENOUS | Status: DC

## 2020-05-29 MED ORDER — MIDAZOLAM HCL 2 MG/2ML IJ SOLN
INTRAMUSCULAR | Status: AC | PRN
  Administered 2020-05-29: 1 mg via INTRAVENOUS

## 2020-05-29 MED ORDER — FENTANYL CITRATE (PF) 100 MCG/2ML IJ SOLN
INTRAMUSCULAR | Status: AC | PRN
  Administered 2020-05-29: 25 ug via INTRAVENOUS

## 2020-05-29 MED ORDER — SODIUM CHLORIDE 0.9 % IV SOLN
INTRAVENOUS | Status: AC | PRN
  Administered 2020-05-29: 10 mL/h via INTRAVENOUS

## 2020-05-29 MED ORDER — FENTANYL CITRATE (PF) 100 MCG/2ML IJ SOLN
INTRAMUSCULAR | Status: AC
  Filled 2020-05-29: qty 2

## 2020-05-29 MED ORDER — MIDAZOLAM HCL 2 MG/2ML IJ SOLN
INTRAMUSCULAR | Status: AC
  Filled 2020-05-29: qty 4

## 2020-05-29 MED ORDER — APIXABAN 5 MG PO TABS: 5.0000 mg | ORAL_TABLET | Freq: Two times a day (BID) | ORAL | 5 refills | Status: DC

## 2020-05-29 NOTE — Discharge Instructions (Addendum)
Lung Biopsy, Care After This information will help you take care of yourself after your procedure. Your health care provider may also give you more specific instructions depending on the type of biopsy you had. If you have problems or questions, contact your health care provider. What can I expect after the procedure? After the procedure, it is common to have:  A cough.  A sore throat.  Pain where a needle or incision was used to collect a biopsy sample (biopsy site). Follow these instructions at home: Medicines  Take over-the-counter and prescription medicines only as told by your health care provider.  Talk to your health care provider before you take any medicines that contain aspirin or NSAIDS, such as ibuprofen. These medicines can increase your risk of bleeding.  Ask your health care provider if the medicine prescribed to you: ? Requires you to avoid driving or using machinery. ? Can cause constipation. You may need to take these actions to prevent or treat constipation:  Drink enough fluid to keep your urine pale yellow.  Take over-the-counter or prescription medicines.  Eat foods that are high in fiber, such as beans, whole grains, fresh fruits and vegetables.  Limit foods that are high in fat and processed sugars, such as fried or sweet foods. Biopsy site care  If you had a needle or open biopsy, follow instructions from your health care provider about how to take care of your biopsy site. Make sure you: ? Wash your hands with soap and water for at least 20 seconds before and after you change your bandage (dressing). If soap and water are not available, use hand sanitizer. ? Change your dressing as told by your health care provider. ? Leave stitches (sutures), skin glue, or adhesive strips in place for as long as you are told. If adhesive strip edges start to loosen and curl up, you may trim the loose edges. Do not remove adhesive strips completely unless your health care  provider tells you to do that.  Do not take baths, swim, or use a hot tub until your health care provider approves. Ask your health care provider if you may take showers. You may only be allowed to take sponge baths.  Check your biopsy site every day for signs of infection. Check for: ? Redness, swelling, or more pain. ? Fluid or blood. ? Warmth. ? Pus or a bad smell.   General instructions  Do not drink alcohol if your health care provider tells you not to drink.  If you were given a sedative during the procedure, it can affect you for several hours. Do not drive or operate machinery until your health care provider says that it is safe.  Return to your normal activities as told by your health care provider. Ask your health care provider what activities are safe for you.  It is up to you to get the results of your procedure. Ask your health care provider, or the department that is doing the procedure, when your results will be ready.  Keep all follow-up visits as told by your health care provider. This is important. Contact a health care provider if:  You have a fever.  You have redness, swelling, or more pain around your biopsy site.  You have fluid or blood coming from your biopsy site.  Your biopsy site feels warm to the touch.  You have pus or a bad smell coming from your biopsy site.  You have pain that does not get better with medicine.  Get help right away if:  You cough up blood.  You have trouble breathing.  You have chest pain.  You lose consciousness. These symptoms may represent a serious problem that is an emergency. Do not wait to see if the symptoms will go away. Get medical help right away. Call your local emergency services (911 in the [place] Do not drive yourself to the hospital. Summary  It is common to have some pain where a needle or incision was used to collect a biopsy sample (biopsy site).  Return to your normal activities as told by your health  care provider. Ask your health care provider what activities are safe for you.  Take over-the-counter and prescription medicines only as told by your health care provider.  Report any unusual symptoms to your health care provider. This information is not intended to replace advice given to you by your health care provider. Make sure you discuss any questions you have with your health care provider. Document Revised: 04/14/2019 Document Reviewed: 04/14/2019 Elsevier Patient Education  2021 [place]. Moderate Conscious Sedation, Adult Sedation is the use of medicines to promote relaxation and to relieve discomfort and anxiety. Moderate conscious sedation is a type of sedation. Under moderate conscious sedation, you are less alert than normal, but you are still able to respond to instructions, touch, or both. Moderate conscious sedation is used during short medical and dental procedures. It is milder than deep sedation, which is a type of sedation under which you cannot be easily woken up. It is also milder than general anesthesia, which is the use of medicines to make you unconscious. Moderate conscious sedation allows you to return to your regular activities sooner. Tell a health care provider about:  Any allergies you have.  All medicines you are taking, including vitamins, herbs, eye drops, creams, and over-the-counter medicines.  Any use of steroids. This includes steroids taken by mouth or as a cream.  Any problems you or family members have had with sedatives and anesthetic medicines.  Any blood disorders you have.  Any surgeries you have had.  Any medical conditions you have, such as sleep apnea.  Whether you are pregnant or may be pregnant.  Any use of cigarettes, alcohol, marijuana, or drugs. What are the risks? Generally, this is a safe procedure. However, problems may occur, including:  Getting too much medicine (oversedation).  Nausea.  Allergic reaction to  medicines.  Trouble breathing. If this happens, a breathing tube may be used. It will be removed when you are awake and breathing on your own.  Heart trouble.  Lung trouble.  Confusion that gets better with time (emergence delirium). What happens before the procedure? Staying hydrated Follow instructions from your health care provider about hydration, which may include:  Up to 2 hours before the procedure - you may continue to drink clear liquids, such as water, clear fruit juice, black coffee, and plain tea. Eating and drinking restrictions Follow instructions from your health care provider about eating and drinking, which may include:  8 hours before the procedure - stop eating heavy meals or foods, such as meat, fried foods, or fatty foods.  6 hours before the procedure - stop eating light meals or foods, such as toast or cereal.  6 hours before the procedure - stop drinking milk or drinks that contain milk.  2 hours before the procedure - stop drinking clear liquids. Medicines Ask your health care provider about:  Changing or stopping your regular medicines. This is especially  important if you are taking diabetes medicines or blood thinners.  Taking medicines such as aspirin and ibuprofen. These medicines can thin your blood. Do not take these medicines unless your health care provider tells you to take them.  Taking over-the-counter medicines, vitamins, herbs, and supplements. Tests and exams  You will have a physical exam.  You may have blood tests done to show how well: ? Your kidneys and liver work. ? Your blood clots. General instructions  Plan to have a responsible adult take you home from the hospital or clinic.  If you will be going home right after the procedure, plan to have a responsible adult care for you for the time you are told. This is important. What happens during the procedure?  You will be given the sedative. The sedative may be given: ? As a  pill that you will swallow. It can also be inserted into the rectum. ? As a spray through the nose. ? As an injection into the muscle. ? As an injection into the vein through an IV.  You may be given oxygen as needed.  Your breathing, heart rate, and blood pressure will be monitored during the procedure.  The medical or dental procedure will be done. The procedure may vary among health care providers and hospitals.   What happens after the procedure?  Your blood pressure, heart rate, breathing rate, and blood oxygen level will be monitored until you leave the hospital or clinic.  You will get fluids through your IV if needed.  Do not drive or operate machinery until your health care provider says that it is safe. Summary  Sedation is the use of medicines to promote relaxation and to relieve discomfort and anxiety. Moderate conscious sedation is a type of sedation that is used during short medical and dental procedures.  Tell the health care provider about any medical conditions that you have and about all the medicines that you are taking.  You will be given the sedative as a pill, a spray through the nose, an injection into the muscle, or an injection into the vein through an IV. Vital signs are monitored during the sedation.  Moderate conscious sedation allows you to return to your regular activities sooner. This information is not intended to replace advice given to you by your health care provider. Make sure you discuss any questions you have with your health care provider. Document Revised: 08/31/2019 Document Reviewed: 03/29/2019 Elsevier Patient Education  2021 [employer].

## 2020-05-29 NOTE — Telephone Encounter (Signed)
Spoke with patient and relayed the below recommendations. Patient wrote down instructions, verbalized understanding, and agreed with plan.l

## 2020-05-29 NOTE — Addendum Note (Signed)
Addended by: Kavin Leech on: 05/29/2020 08:50 AM   Modules accepted: Orders

## 2020-05-29 NOTE — H&P (Signed)
Chief Complaint: Patient was seen in consultation today for right lung mass biopsy at the request of Owen,Clarence H  Referring Physician(s): Owen,Clarence H  Supervising Physician: Daryll Brod  Patient Status: [facility] - Out-pt  History of Present Illness: Kristin [name] is a 70 y.o. female   Hx CHF; COPD; +smoker HTN; Chronic hypoxic Resy Failure  3L  OSA; Hypothyroid; Ao stenosis and CAD  Was having active work up for TAVR and CT revealed  RLL mass suspicious for malignancy CT 04/08/20: 3. 1.9 x 1.4 x 1.6 cm macrolobulated nodule in the superior segment of the right lower lobe, highly concerning for primary bronchogenic neoplasm. Further evaluation with PET-CT is recommended in the near future to better evaluate this finding and assess for potential metastatic disease. No definite metastatic disease is identified in the chest, abdomen or pelvis on today's examination  PET 05/13/20: IMPRESSION: 1. 2.1 by 1.4 cm right lower lobe pulmonary nodule is hypermetabolic with maximum SUV of 5.4, compatible with malignancy. No appreciable nodal or metastatic spread. 2. Mildly accentuated activity along the inferior endplate of L2, probably incidental. 3. Hypodense 1.5 by 1.2 cm right inferior thyroid lobe nodule. Recommend thyroid US (ref: J Am Coll Radiol. 2015 Feb;12(2): 143-50).  Pt continues to have SOB and cough Home O2 Was seen even just yesterday in ED for Afib/palpitations  MDM / ED COURSE 70 year old woman recently diagnosed with A. fib and started on amiodarone, presented to the ED with symptomatic A. fib in the setting of her not being to pick up her outpatient prescription yet, and ultimately amenable to outpatient management.  Twelve-lead and telemetry strips from EMS reviewed with rapid A. fib, but she is in a sinus rhythm here in the ED and maintains normal vital signs on her home O2.  Exam with an obese patient without evidence of acute derangements.  She reports  that she has been unable to fill her amiodarone prescription yet due to pharmacy delays, but her son in law is able to pick up that prescription while she is here in the ED.  We will provide her with 1 tablet of amiodarone and observe for about 1.5 hours without recurrence of A. fib with RVR.  EKG shows normal sinus rhythm without ischemic features.  No indications for further work-up at this time.  We discussed return precautions for the ED and compliance with amiodarone.  Rx Eliquis-- has not yet started this medication  Scheduled today for Right lung mass biopsy Per Dr Roxy Manns order   Past Medical History:  Diagnosis Date  . Anxiety   . Asthma   . CHF (congestive heart failure) ([place])    2018  . COPD (chronic obstructive pulmonary disease) ([place])   . Diabetes mellitus without complication ([place])   . Dyspnea   . GERD (gastroesophageal reflux disease)   . History of hiatal hernia   . History of orthopnea   . Hypothyroidism   . Neuropathy   . Oxygen deficiency    3L/HS  . Pain    BACK/DDD  . Peripheral vascular disease ([place])   . RLS (restless legs syndrome)   . Sleep apnea   . Wheezing     Past Surgical History:  Procedure Laterality Date  . BREAST SURGERY    . CATARACT EXTRACTION W/PHACO Right 03/02/2018   Procedure: CATARACT EXTRACTION PHACO AND INTRAOCULAR LENS PLACEMENT ([place]);  Surgeon: Marchia Meiers, MD;  Location: [facility] ORS;  Service: Ophthalmology;  Laterality: Right;  [place] 01:15 CDE 16.46 Fluid pack  lot # [id number]  . CATARACT EXTRACTION W/PHACO Left 04/27/2018   Procedure: CATARACT EXTRACTION PHACO AND INTRAOCULAR LENS PLACEMENT ([place])- LEFT DIABETIC;  Surgeon: Marchia Meiers, MD;  Location: [facility] ORS;  Service: Ophthalmology;  Laterality: Left;  Lot # [id number] H [place]: 00:46.3 CDE: 8.07   . CYST EXCISION     FOREHEAD  . FOOT SURGERY     CYST  . RIGHT/LEFT HEART CATH AND CORONARY ANGIOGRAPHY N/A 03/31/2020   Procedure: RIGHT/LEFT HEART CATH AND CORONARY ANGIOGRAPHY;  Surgeon:  Wellington Hampshire, MD;  Location: [facility];  Service: Cardiovascular;  Laterality: N/A;  . TUBAL LIGATION      Allergies: Altace [ramipril]  Medications: Prior to Admission medications   Medication Sig Start Date End Date Taking? Authorizing Provider  albuterol (VENTOLIN HFA) 108 (90 Base) MCG/ACT inhaler Inhale 2 puffs into the lungs every 4 (four) hours as needed for wheezing or shortness of breath.    Yes [provider]  amiodarone (PACERONE) 400 MG tablet Take 1 tablet (400 mg total) by mouth 2 (two) times daily. 05/27/20  Yes Lorella Nimrod, MD  apixaban (ELIQUIS) 5 MG TABS tablet Take 1 tablet (5 mg total) by mouth 2 (two) times daily. 05/29/20  Yes Dunn, Areta Haber, PA-C  atorvastatin (LIPITOR) 80 MG tablet Take 80 mg by mouth at bedtime.  09/05/19  Yes [provider]  citalopram (CELEXA) 40 MG tablet Take 40 mg by mouth daily.   Yes [provider]  esomeprazole (NEXIUM) 40 MG capsule Take 40 mg by mouth daily. 10/01/19  Yes [provider]  famotidine (PEPCID) 40 MG tablet Take 40 mg by mouth daily.   Yes [provider]  fenofibrate 160 MG tablet Take 160 mg by mouth daily.   Yes [provider]  furosemide (LASIX) 40 MG tablet Take 40 mg by mouth daily.   Yes [provider]  gabapentin (NEURONTIN) 600 MG tablet Take 600 mg by mouth 2 (two) times daily.  11/20/19  Yes [provider]  hydrOXYzine (VISTARIL) 50 MG capsule Take 50 mg by mouth every 6 (six) hours as needed for anxiety. 03/14/20  Yes [provider]  levothyroxine (SYNTHROID, LEVOTHROID) 75 MCG tablet Take 75 mcg by mouth daily before breakfast.   Yes [provider]  losartan (COZAAR) 50 MG tablet Take 1 tablet (50 mg total) by mouth daily. 05/27/20  Yes Lorella Nimrod, MD  Melatonin 10 MG TABS Take 10 mg by mouth at bedtime as needed (sleep).   Yes [provider]  metFORMIN (GLUCOPHAGE) 500 MG tablet Take 500 mg by  mouth 2 (two) times daily. 09/19/19  Yes [provider]  oxyCODONE-acetaminophen (PERCOCET) 10-325 MG tablet Take 1 tablet by mouth 4 (four) times daily as needed for moderate pain or severe pain.   Yes [provider]  sitaGLIPtin (JANUVIA) 100 MG tablet Take 100 mg by mouth daily.   Yes [provider]  Tiotropium Bromide Monohydrate 2.5 MCG/ACT AERS Inhale 2 puffs into the lungs daily.    Yes [provider]     Family History  Problem Relation Age of Onset  . Heart disease Mother   . Cancer Father     Social History   Socioeconomic History  . Marital status: Widowed    Spouse name: Not on file  . Number of children: 4  . Years of education: Not on file  . Highest education level: Not on file  Occupational History  . Occupation: Health and safety inspector  Tobacco Use  . Smoking status: Current Every Day Smoker    Packs/day: 1.00    Years: 50.00    Pack years: 50.00    Types: Cigarettes  . Smokeless tobacco: Never Used  Substance and Sexual Activity  . Alcohol use: Not Currently  . Drug use: Not on file  . Sexual activity: Not on file  Other Topics Concern  . Not on file  Social History Narrative  . Not on file   Social Determinants of Health   Financial Resource Strain: Not on file  Food Insecurity: Not on file  Transportation Needs: Not on file  Physical Activity: Not on file  Stress: Not on file  Social Connections: Not on file     Review of Systems: A 12 point ROS discussed and pertinent positives are indicated in the HPI above.  All other systems are negative.  Review of Systems  Constitutional: Positive for activity change and fatigue. Negative for fever and unexpected weight change.  Respiratory: Positive for cough, shortness of breath and wheezing.   Cardiovascular: Negative for chest pain.  Gastrointestinal: Negative for nausea and vomiting.  Musculoskeletal: Positive for gait problem.  Neurological: Positive for  weakness.  Psychiatric/Behavioral: Negative for behavioral problems and confusion.    Vital Signs: BP (!) 165/59   Pulse 60   Temp 97.7 F (36.5 C) (Oral)   Ht 5\' 2"  (1.575 m)   Wt 230 lb 6.4 oz (104.5 kg)   LMP  (LMP Unknown)   SpO2 100%   BMI 42.14 kg/m   Physical Exam Vitals reviewed.  HENT:     Mouth/Throat:     Mouth: Mucous membranes are moist.  Cardiovascular:     Rate and Rhythm: Normal rate. Rhythm irregular.     Heart sounds: Murmur heard.    Pulmonary:     Effort: Pulmonary effort is normal.     Breath sounds: Wheezing present.  Abdominal:     Palpations: Abdomen is soft.  Musculoskeletal:        General: Normal range of motion.  Skin:    General: Skin is warm.  Neurological:     Mental Status: She is alert and oriented to person, place, and time.  Psychiatric:        Behavior: Behavior normal.     Imaging: DG Chest 1 View  Result Date: 05/26/2020 CLINICAL DATA:  Chest pain EXAM: CHEST  1 VIEW COMPARISON:  March 28, 2020 chest radiograph and chest CT April 08, 2020; PET-CT May 13, 2020 FINDINGS: Mass noted on recent prior CT examinations in the right lower lobe is less well delineated by radiography. It is seen at the hilar level on frontal view measuring 1.8 x 1.4 cm. Lungs elsewhere are clear. Heart is mildly enlarged with pulmonary vascularity normal. No adenopathy. There is aortic atherosclerosis. No bone lesions. No pneumothorax. IMPRESSION: Nodular opacity on the right seen lateral to the right hilum on frontal radiograph, measuring 1.8 x 1.4 cm. This lesion is better delineated on recent CT examinations. No edema or airspace opacity. Stable cardiac enlargement. Aortic Atherosclerosis (ICD10-I70.0). Electronically Signed   By: Lowella Grip III M.D.   On: 05/26/2020 10:13   MR BRAIN W WO CONTRAST  Result Date: 05/26/2020 CLINICAL DATA:  Lung cancer.  Staging. EXAM: MRI HEAD WITHOUT AND WITH CONTRAST TECHNIQUE: Multiplanar, multiecho  pulse sequences of the brain and surrounding structures were obtained without and with intravenous contrast. CONTRAST:  71mL GADAVIST GADOBUTROL 1 MMOL/ML IV SOLN COMPARISON:  None. FINDINGS:  Brain: Image quality degraded by mild motion Ventricle size normal. Negative for acute infarct. Negative for hemorrhage or mass. Moderate white matter changes most consistent with chronic microvascular ischemia. No enhancing metastatic deposits identified. Vascular: Normal arterial flow voids. Skull and upper cervical spine: No focal skeletal lesion. Sinuses/Orbits: Mild mucosal edema paranasal sinuses. Air-fluid level right maxillary sinus. Bilateral cataract extraction Other: None IMPRESSION: Negative for metastatic disease to the brain. Chronic microvascular ischemic change in the white matter without acute infarct. Electronically Signed   By: Franchot Gallo M.D.   On: 05/26/2020 15:38   NM PET Image Initial (PI) Skull Base To Thigh  Result Date: 05/13/2020 CLINICAL DATA:  Initial treatment strategy for lung nodule. EXAM: NUCLEAR MEDICINE PET SKULL BASE TO THIGH TECHNIQUE: 11.9 mCi F-18 FDG was injected intravenously. Full-ring PET imaging was performed from the skull base to thigh after the radiotracer. CT data was obtained and used for attenuation correction and anatomic localization. Fasting blood glucose: 104 mg/dl COMPARISON:  04/08/2020 CT scan FINDINGS: Mediastinal blood pool activity: SUV max 2.7 Liver activity: SUV max NA Body habitus reduces diagnostic sensitivity and specificity. NECK: No significant abnormal hypermetabolic activity in this region. Incidental CT findings: Bilateral common carotid atherosclerotic calcifications. Chronic right maxillary sinusitis. Hypodense 1.5 by 1.2 cm right inferior thyroid lobe nodule is not substantially more hypermetabolic than the rest of the thyroid gland. Very faint associated calcification. Recommend thyroid US (ref: J Am Coll Radiol. 2015 Feb;12(2): 143-50). CHEST:  The right lower lobe nodule is measured at 2.1 by 1.4 cm on image 90 of series 4 and has maximum SUV of 5.4, compatible with malignancy. No appreciable hypermetabolic adenopathy. Incidental CT findings: Coronary, aortic arch, and branch vessel atherosclerotic vascular disease. Mild cardiomegaly. Mitral valve calcification. ABDOMEN/PELVIS: Widespread physiologic activity in bowel. Incidental CT findings: Diffuse hepatic steatosis. Cholelithiasis. Aortoiliac atherosclerotic vascular disease. Hyperdense but photopenic left kidney upper pole exophytic lesion measuring 4.0 by 3.5 cm on image 141 of series 4, appearance favors a complex cyst. SKELETON: Faintly accentuated activity inferiorly along the L2 vertebral body without CT correlate, maximum SUV 6.1, benign etiology favored. Mildly head heterogeneous speckled activity at other vertebral levels. Incidental CT findings: none IMPRESSION: 1. 2.1 by 1.4 cm right lower lobe pulmonary nodule is hypermetabolic with maximum SUV of 5.4, compatible with malignancy. No appreciable nodal or metastatic spread. 2. Mildly accentuated activity along the inferior endplate of L2, probably incidental. 3. Hypodense 1.5 by 1.2 cm right inferior thyroid lobe nodule. Recommend thyroid US (ref: J Am Coll Radiol. 2015 Feb;12(2): 143-50). 4. Other imaging findings of potential clinical significance: Chronic right maxillary sinusitis. Aortic Atherosclerosis (ICD10-I70.0). Coronary atherosclerosis. Mild cardiomegaly. Mitral valve calcification. Diffuse hepatic steatosis. Cholelithiasis. Complex but photopenic left kidney upper pole cyst, most likely to be benign. Electronically Signed   By: Van Clines M.D.   On: 05/13/2020 16:43    Labs:  CBC: Recent Labs    03/31/20 0327 04/01/20 0906 05/26/20 0949 05/27/20 0714  WBC 10.5 8.9 9.2 8.0  HGB 11.8* 13.9 14.5 14.2  HCT 38.0 44.2 44.6 43.9  PLT 217 179 261 233    COAGS: No results for input(s): INR, APTT in the last 8760  hours.  BMP: Recent Labs    05/23/20 1106 05/26/20 0949 05/27/20 0714 05/28/20 1702  NA 141 138 138 138  K 4.5 3.8 4.2 4.0  CL 100 95* 95* 94*  CO2 34* 30 31 32  GLUCOSE 136* 182* 120* 133*  BUN 12 18 20  23  CALCIUM 8.5* 9.0 9.3 9.5  CREATININE 0.79 0.85 0.84 1.07*  GFRNONAA >60 >60 >60 57*    LIVER FUNCTION TESTS: Recent Labs    03/28/20 2341  BILITOT 0.5  AST 19  ALT 14  ALKPHOS 37*  PROT 6.9  ALBUMIN 3.3*    TUMOR MARKERS: No results for input(s): AFPTM, CEA, CA199, CHROMGRNA in the last 8760 hours.  Assessment and Plan:  Work for Ao stenosis-- TAVR CT revealing RLL mass +PET Now scheduled for right lung  mass biopsy per Dr Roxy Manns  Risks and benefits of CT guided lung nodule biopsy was discussed with the patient including, but not limited to bleeding, hemoptysis, respiratory failure requiring intubation, infection, pneumothorax requiring chest tube placement, stroke from air embolism or even death.  All of the patient's questions were answered and the patient is agreeable to proceed. Consent signed and in chart.  Thank you for this interesting consult.  I greatly enjoyed meeting Kristin [name] and look forward to participating in their care.  A copy of this report was sent to the requesting provider on this date.  Electronically Signed: Lavonia Drafts, PA-C 05/29/2020, 9:51 AM   I spent a total of  30 Minutes   in face to face in clinical consultation, greater than 50% of which was counseling/coordinating care for right lung mass biopsy

## 2020-05-29 NOTE — Sedation Documentation (Signed)
Patient is resting comfortably. 

## 2020-05-29 NOTE — Procedures (Signed)
Interventional Radiology Procedure Note  Procedure: CT BX RLL SUP SEG NODULE    Complications: None  Estimated Blood Loss:  MIN  Findings: 66 G CORE    Tamera Punt, MD

## 2020-05-30 ENCOUNTER — Encounter: Payer: Self-pay | Admitting: Thoracic Surgery (Cardiothoracic Vascular Surgery)

## 2020-05-30 ENCOUNTER — Telehealth: Payer: Self-pay | Admitting: Thoracic Surgery (Cardiothoracic Vascular Surgery)

## 2020-05-30 LAB — SURGICAL PATHOLOGY

## 2020-05-30 NOTE — Telephone Encounter (Signed)
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  We also discussed the results of her recent MRI of the brain which showed no evidence for intracranial metastases.  I told the patient that she should expect a telephone call in the near future from the cancer clinic to schedule follow-up appointment in the near future to discuss treatment options further.  All questions answered.  Rexene Alberts, MD 05/30/2020 1:42 PM

## 2020-06-02 ENCOUNTER — Ambulatory Visit: Payer: 59

## 2020-06-02 ENCOUNTER — Ambulatory Visit
Admit: 2020-06-02 | Discharge: 2020-06-02 | Disposition: A | Discharge status: Home / Self Care | Payer: 59 | Attending: Radiation Oncology | Admitting: Radiation Oncology

## 2020-06-02 ENCOUNTER — Telehealth: Payer: Self-pay | Admitting: Physician Assistant

## 2020-06-02 NOTE — Telephone Encounter (Signed)
  Patient Consent for Virtual Visit         Kristin [name] has provided verbal consent on 06/02/2020 for a virtual visit (video or telephone).   CONSENT FOR VIRTUAL VISIT FOR:  Kristin [name]  By participating in this virtual visit I agree to the following:  I hereby voluntarily request, consent and authorize [facility] and its employed or contracted physicians, Engineer, materials, nurse practitioners or other licensed health care professionals (the Practitioner), to provide me with telemedicine health care services (the "Services") as deemed necessary by the treating Practitioner. I acknowledge and consent to receive the Services by the Practitioner via telemedicine. I understand that the telemedicine visit will involve communicating with the Practitioner through live audiovisual communication technology and the disclosure of certain medical information by electronic transmission. I acknowledge that I have been given the opportunity to request an in-person assessment or other available alternative prior to the telemedicine visit and am voluntarily participating in the telemedicine visit.  I understand that I have the right to withhold or withdraw my consent to the use of telemedicine in the course of my care at any time, without affecting my right to future care or treatment, and that the Practitioner or I may terminate the telemedicine visit at any time. I understand that I have the right to inspect all information obtained and/or recorded in the course of the telemedicine visit and may receive copies of available information for a reasonable fee.  I understand that some of the potential risks of receiving the Services via telemedicine include:  [place]land Kitchen Delay or interruption in medical evaluation due to technological equipment failure or disruption; . Information transmitted may not be sufficient (e.g. poor resolution of images) to allow for appropriate medical decision making by the Practitioner;  and/or  . In rare instances, security protocols could fail, causing a breach of personal health information.  Furthermore, I acknowledge that it is my responsibility to provide information about my medical history, conditions and care that is complete and accurate to the best of my ability. I acknowledge that Practitioner's advice, recommendations, and/or decision may be based on factors not within their control, such as incomplete or inaccurate data provided by me or distortions of diagnostic images or specimens that may result from electronic transmissions. I understand that the practice of medicine is not an exact science and that Practitioner makes no warranties or guarantees regarding treatment outcomes. I acknowledge that a copy of this consent can be made available to me via my patient portal ([facility]), or I can request a printed copy by calling the office of [facility].    I understand that my insurance will be billed for this visit.   I have read or had this consent read to me. . I understand the contents of this consent, which adequately explains the benefits and risks of the Services being provided via telemedicine.  . I have been provided ample opportunity to ask questions regarding this consent and the Services and have had my questions answered to my satisfaction. . I give my informed consent for the services to be provided through the use of telemedicine in my medical care

## 2020-06-02 NOTE — Progress Notes (Incomplete)
Radiation Oncology         [phone] ________________________________  Initial Outpatient Consultation  Name: Kristin [name] MRN: [id number]  Date: 06/02/2020  DOB: Feb 03, 1951  TI:[id number], No Pcp Per  Rexene Alberts, MD   REFERRING PHYSICIAN: Rexene Alberts, MD  DIAGNOSIS: There were no encounter diagnoses.  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 female who is seen as a courtesy of Dr. Roxy Manns for an opinion concerning radiation therapy as part of management for her recently diagnosed lung cancer. Today, she is accompanied by ***. The patient underwent a CTA of chest, abdomen, and pelvis on 04/08/2020 as part of a pre-procedural study prior to potential transcatheter aortic valve replacement procedure. Results incidentally showed a 1.9 x 1.4 x 1.6 cm macrolobulated nodule in the superior segment of the right lower lobe that was highly concerning for primary bronchogenic neoplasm. No definite metastatic disease was identified in the chest, abdomen, or pelvis.  PET scan on 05/13/2020 showed a hypermetabolic 2.1 x 1.4 cm right lower lobe pulmonary nodule that was compatible with malignancy. There was no appreciable nodal or metastatic spread. However, there was mildly accentuated activity along the inferior endplate of L2, probably incidental. Additionally, there was a hypodense 1.5 x 1.2 cm right inferior thyroid lobe nodule.  MRI of brain on 05/26/2020 showed chronic microvascular ischemic change in the white matter without acute infarct or metastatic disease to the brain.  CT-guided biopsy of the right lower lobe nodule was performed on 05/29/2020 by Dr. Annamaria Boots. Pathology from the procedure revealed invasive moderately differentiated squamous cell carcinoma.  PREVIOUS RADIATION THERAPY: No  PAST MEDICAL HISTORY:  Past Medical History:  Diagnosis Date  . Anxiety   . Asthma   . CHF  (congestive heart failure) ([place])    2018  . COPD (chronic obstructive pulmonary disease) ([place])   . Diabetes mellitus without complication ([place])   . Dyspnea   . GERD (gastroesophageal reflux disease)   . History of hiatal hernia   . History of orthopnea   . Hypothyroidism   . Neuropathy   . Oxygen deficiency    3L/HS  . Pain    BACK/DDD  . Peripheral vascular disease ([place])   . RLS (restless legs syndrome)   . Sleep apnea   . Wheezing     PAST SURGICAL HISTORY: Past Surgical History:  Procedure Laterality Date  . BREAST SURGERY    . CATARACT EXTRACTION W/PHACO Right 03/02/2018   Procedure: CATARACT EXTRACTION PHACO AND INTRAOCULAR LENS PLACEMENT ([place]);  Surgeon: Marchia Meiers, MD;  Location: [facility];  Service: Ophthalmology;  Laterality: Right;  [place] 01:15 CDE 16.46 Fluid pack lot # [id number]  . CATARACT EXTRACTION W/PHACO Left 04/27/2018   Procedure: CATARACT EXTRACTION PHACO AND INTRAOCULAR LENS PLACEMENT ([place])- LEFT DIABETIC;  Surgeon: Marchia Meiers, MD;  Location: [facility];  Service: Ophthalmology;  Laterality: Left;  Lot # [id number] [place]: 00:46.3 CDE: 8.07   . CYST EXCISION     FOREHEAD  . FOOT SURGERY     CYST  . RIGHT/LEFT HEART CATH AND CORONARY ANGIOGRAPHY N/A 03/31/2020   Procedure: RIGHT/LEFT HEART CATH AND CORONARY ANGIOGRAPHY;  Surgeon: Wellington Hampshire, MD;  Location: [facility];  Service: Cardiovascular;  Laterality: N/A;  . TUBAL LIGATION      FAMILY HISTORY:  Family History  Problem Relation Age of Onset  . Heart disease Mother   . Cancer Father  SOCIAL HISTORY:  Social History   Tobacco Use  . Smoking status: Current Every Day Smoker    Packs/day: 1.00    Years: 50.00    Pack years: 50.00    Types: Cigarettes  . Smokeless tobacco: Never Used  Substance Use Topics  . Alcohol use: Not Currently    ALLERGIES:  Allergies  Allergen Reactions  . Altace [Ramipril] Swelling    MEDICATIONS:  Current Outpatient Medications   Medication Sig Dispense Refill  . albuterol (VENTOLIN HFA) 108 (90 Base) MCG/ACT inhaler Inhale 2 puffs into the lungs every 4 (four) hours as needed for wheezing or shortness of breath.     Marland Kitchen amiodarone (PACERONE) 400 MG tablet Take 1 tablet (400 mg total) by mouth 2 (two) times daily. 60 tablet 0  . apixaban (ELIQUIS) 5 MG TABS tablet Take 1 tablet (5 mg total) by mouth 2 (two) times daily. 60 tablet 5  . atorvastatin (LIPITOR) 80 MG tablet Take 80 mg by mouth at bedtime.     . citalopram (CELEXA) 40 MG tablet Take 40 mg by mouth daily.    Marland Kitchen esomeprazole (NEXIUM) 40 MG capsule Take 40 mg by mouth daily.    . famotidine (PEPCID) 40 MG tablet Take 40 mg by mouth daily.    . fenofibrate 160 MG tablet Take 160 mg by mouth daily.    . furosemide (LASIX) 40 MG tablet Take 40 mg by mouth daily.    Marland Kitchen gabapentin (NEURONTIN) 600 MG tablet Take 600 mg by mouth 2 (two) times daily.     . hydrOXYzine (VISTARIL) 50 MG capsule Take 50 mg by mouth every 6 (six) hours as needed for anxiety.    Marland Kitchen levothyroxine (SYNTHROID, LEVOTHROID) 75 MCG tablet Take 75 mcg by mouth daily before breakfast.    . losartan (COZAAR) 50 MG tablet Take 1 tablet (50 mg total) by mouth daily. 30 tablet 0  . Melatonin 10 MG TABS Take 10 mg by mouth at bedtime as needed (sleep).    . metFORMIN (GLUCOPHAGE) 500 MG tablet Take 500 mg by mouth 2 (two) times daily.    Marland Kitchen oxyCODONE-acetaminophen (PERCOCET) 10-325 MG tablet Take 1 tablet by mouth 4 (four) times daily as needed for moderate pain or severe pain.    . sitaGLIPtin (JANUVIA) 100 MG tablet Take 100 mg by mouth daily.    . Tiotropium Bromide Monohydrate 2.5 MCG/ACT AERS Inhale 2 puffs into the lungs daily.      No current facility-administered medications for this encounter.    REVIEW OF SYSTEMS:  A 10+ POINT REVIEW OF SYSTEMS WAS OBTAINED including neurology, dermatology, psychiatry, cardiac, respiratory, lymph, extremities, GI, GU, musculoskeletal, constitutional,  reproductive, HEENT. ***   PHYSICAL EXAM:  vitals were not taken for this visit.   General: Alert and oriented, in no acute distress HEENT: Head is normocephalic. Extraocular movements are intact. Oropharynx is clear. Neck: Neck is supple, no palpable cervical or supraclavicular lymphadenopathy. Heart: Regular in rate and rhythm with no murmurs, rubs, or gallops. Chest: Clear to auscultation bilaterally, with no rhonchi, wheezes, or rales. Abdomen: Soft, nontender, nondistended, with no rigidity or guarding. Extremities: No cyanosis or edema. Lymphatics: see Neck Exam Skin: No concerning lesions. Musculoskeletal: symmetric strength and muscle tone throughout. Neurologic: Cranial nerves II through XII are grossly intact. No obvious focalities. Speech is fluent. Coordination is intact. Psychiatric: Judgment and insight are intact. Affect is appropriate. ***  ECOG = ***  0 - Asymptomatic (Fully active, able to carry on all  predisease activities without restriction)  1 - Symptomatic but completely ambulatory (Restricted in physically strenuous activity but ambulatory and able to carry out work of a light or sedentary nature. For example, light housework, office work)  2 - Symptomatic, <50% in bed during the day (Ambulatory and capable of all self care but unable to carry out any work activities. Up and about more than 50% of waking hours)  3 - Symptomatic, >50% in bed, but not bedbound (Capable of only limited self-care, confined to bed or chair 50% or more of waking hours)  4 - Bedbound (Completely disabled. Cannot carry on any self-care. Totally confined to bed or chair)  5 - Death   Eustace Pen MM, Creech RH, Tormey DC, et al. [phone]). "Toxicity and response criteria of the [facility]". [place] Oncol. 5 (6): 649-55  LABORATORY DATA:  Lab Results  Component Value Date   WBC 8.3 05/29/2020   HGB 12.4 05/29/2020   HCT 38.3 05/29/2020   MCV 92.7 05/29/2020    PLT 213 05/29/2020   NEUTROABS 7.5 07/04/2018   Lab Results  Component Value Date   NA 138 05/28/2020   K 4.0 05/28/2020   CL 94 (L) 05/28/2020   CO2 32 05/28/2020   GLUCOSE 133 (H) 05/28/2020   CREATININE 1.07 (H) 05/28/2020   CALCIUM 9.5 05/28/2020      RADIOGRAPHY: DG Chest 1 View  Result Date: 05/26/2020 CLINICAL DATA:  Chest pain EXAM: CHEST  1 VIEW COMPARISON:  March 28, 2020 chest radiograph and chest CT April 08, 2020; PET-CT May 13, 2020 FINDINGS: Mass noted on recent prior CT examinations in the right lower lobe is less well delineated by radiography. It is seen at the hilar level on frontal view measuring 1.8 x 1.4 cm. Lungs elsewhere are clear. Heart is mildly enlarged with pulmonary vascularity normal. No adenopathy. There is aortic atherosclerosis. No bone lesions. No pneumothorax. IMPRESSION: Nodular opacity on the right seen lateral to the right hilum on frontal radiograph, measuring 1.8 x 1.4 cm. This lesion is better delineated on recent CT examinations. No edema or airspace opacity. Stable cardiac enlargement. Aortic Atherosclerosis (ICD10-I70.0). Electronically Signed   By: Lowella Grip III M.D.   On: 05/26/2020 10:13   MR BRAIN W WO CONTRAST  Result Date: 05/26/2020 CLINICAL DATA:  Lung cancer.  Staging. EXAM: MRI HEAD WITHOUT AND WITH CONTRAST TECHNIQUE: Multiplanar, multiecho pulse sequences of the brain and surrounding structures were obtained without and with intravenous contrast. CONTRAST:  76mL GADAVIST GADOBUTROL 1 MMOL/ML IV SOLN COMPARISON:  None. FINDINGS: Brain: Image quality degraded by mild motion Ventricle size normal. Negative for acute infarct. Negative for hemorrhage or mass. Moderate white matter changes most consistent with chronic microvascular ischemia. No enhancing metastatic deposits identified. Vascular: Normal arterial flow voids. Skull and upper cervical spine: No focal skeletal lesion. Sinuses/Orbits: Mild mucosal edema paranasal  sinuses. Air-fluid level right maxillary sinus. Bilateral cataract extraction Other: None IMPRESSION: Negative for metastatic disease to the brain. Chronic microvascular ischemic change in the white matter without acute infarct. Electronically Signed   By: Franchot Gallo M.D.   On: 05/26/2020 15:38   NM PET Image Initial (PI) Skull Base To Thigh  Result Date: 05/13/2020 CLINICAL DATA:  Initial treatment strategy for lung nodule. EXAM: NUCLEAR MEDICINE PET SKULL BASE TO THIGH TECHNIQUE: 11.9 mCi F-18 FDG was injected intravenously. Full-ring PET imaging was performed from the skull base to thigh after the radiotracer. CT data was obtained and used for  attenuation correction and anatomic localization. Fasting blood glucose: 104 mg/dl COMPARISON:  04/08/2020 CT scan FINDINGS: Mediastinal blood pool activity: SUV max 2.7 Liver activity: SUV max NA Body habitus reduces diagnostic sensitivity and specificity. NECK: No significant abnormal hypermetabolic activity in this region. Incidental CT findings: Bilateral common carotid atherosclerotic calcifications. Chronic right maxillary sinusitis. Hypodense 1.5 by 1.2 cm right inferior thyroid lobe nodule is not substantially more hypermetabolic than the rest of the thyroid gland. Very faint associated calcification. Recommend thyroid US (ref: J Am Coll Radiol. 2015 Feb;12(2): 143-50). CHEST: The right lower lobe nodule is measured at 2.1 by 1.4 cm on image 90 of series 4 and has maximum SUV of 5.4, compatible with malignancy. No appreciable hypermetabolic adenopathy. Incidental CT findings: Coronary, aortic arch, and branch vessel atherosclerotic vascular disease. Mild cardiomegaly. Mitral valve calcification. ABDOMEN/PELVIS: Widespread physiologic activity in bowel. Incidental CT findings: Diffuse hepatic steatosis. Cholelithiasis. Aortoiliac atherosclerotic vascular disease. Hyperdense but photopenic left kidney upper pole exophytic lesion measuring 4.0 by 3.5 cm on  image 141 of series 4, appearance favors a complex cyst. SKELETON: Faintly accentuated activity inferiorly along the L2 vertebral body without CT correlate, maximum SUV 6.1, benign etiology favored. Mildly head heterogeneous speckled activity at other vertebral levels. Incidental CT findings: none IMPRESSION: 1. 2.1 by 1.4 cm right lower lobe pulmonary nodule is hypermetabolic with maximum SUV of 5.4, compatible with malignancy. No appreciable nodal or metastatic spread. 2. Mildly accentuated activity along the inferior endplate of L2, probably incidental. 3. Hypodense 1.5 by 1.2 cm right inferior thyroid lobe nodule. Recommend thyroid US (ref: J Am Coll Radiol. 2015 Feb;12(2): 143-50). 4. Other imaging findings of potential clinical significance: Chronic right maxillary sinusitis. Aortic Atherosclerosis (ICD10-I70.0). Coronary atherosclerosis. Mild cardiomegaly. Mitral valve calcification. Diffuse hepatic steatosis. Cholelithiasis. Complex but photopenic left kidney upper pole cyst, most likely to be benign. Electronically Signed   By: Van Clines M.D.   On: 05/13/2020 16:43   CT BIOPSY  Result Date: 05/29/2020 INDICATION: Right lower lobe superior segment PET positive lung nodule EXAM: CT-GUIDED BIOPSY RIGHT LOWER LOBE SUPERIOR SEGMENT NODULE MEDICATIONS: 1% LIDOCAINE LOCAL ANESTHESIA/SEDATION: 1.0 mg IV Versed; 25 mcg IV Fentanyl Moderate Sedation Time:  13 MINUTES The patient was continuously monitored during the procedure by the interventional radiology nurse under my direct supervision. PROCEDURE: The procedure, risks, benefits, and alternatives were explained to the patient. Questions regarding the procedure were encouraged and answered. The patient understands and consents to the procedure. previous imaging reviewed. patient positioned right side down decubitus. noncontrast localization ct performed. the right lower lobe superior segment nodule was localized and marked for a posterior approach.  under sterile conditions and local anesthesia, a 17 gauge guide needle was advanced to the nodule. needle position confirmed along the medial edge. 1 2 cm 18 gauge core biopsy obtained. this was intact and non fragmented. samples placed in formalin. postprocedure imaging demonstrates a small amount of surrounding hemorrhage related to the biopsy. needle tract occluded with the bio sentry device. postprocedure imaging demonstrates no effusion or pneumothorax. Patient tolerated the procedure well without complication. Vital sign monitoring by nursing staff during the procedure will continue as patient is in the special procedures unit for post procedure observation. FINDINGS: The images document guide needle placement within the right lower lobe superior segment nodule. Post biopsy images demonstrate small amount of surrounding pulmonary hemorrhage. COMPLICATIONS: None immediate. IMPRESSION: Successful CT-guided core biopsy of the right lower lobe superior segment nodule Electronically Signed   By: Jerilynn Mages.  Shick M.D.  On: 05/29/2020 13:39   DG Chest Port 1 View  Result Date: 05/29/2020 CLINICAL DATA:  Post CT-guided biopsy of a RIGHT lung nodule. EXAM: PORTABLE CHEST 1 VIEW COMPARISON:  Images at the time of the CT biopsy earlier same day. Chest x-ray 05/26/2020 and earlier. CTA chest 04/08/2020. FINDINGS: No evidence of pneumothorax after RIGHT lung biopsy. Expected post biopsy changes surrounding the nodule in the superior segment RIGHT LOWER LOBE. No new pulmonary parenchymal abnormalities elsewhere. Prominent bronchovascular markings diffusely and moderate central peribronchial thickening, unchanged. IMPRESSION: 1. No pneumothorax after RIGHT lung biopsy. Expected post biopsy changes surrounding the nodule in the superior segment RIGHT LOWER LOBE. 2. Stable changes of chronic bronchitis and/or asthma. Electronically Signed   By: Evangeline Dakin M.D.   On: 05/29/2020 13:07      IMPRESSION: Invasive moderately  differentiated squamous cell carcinoma of the right lower lobe  ***  Today, I talked to the patient and family about the findings and work-up thus far.  We discussed the natural history of lung cancer and general treatment, highlighting the role of radiotherapy in the management.  We discussed the available radiation techniques, and focused on the details of logistics and delivery.  We reviewed the anticipated acute and late sequelae associated with radiation in this setting.  The patient was encouraged to ask questions that I answered to the best of my ability. *** A patient consent form was discussed and signed.  We retained a copy for our records.  The patient would like to proceed with radiation and will be scheduled for CT simulation.  PLAN: ***   Total time spent in this encounter was *** minutes which included reviewing the patient's most recent CTA of chest/abdomen/pelvis, PET scan, brain MRI, biopsy, pathology report, consultations, follow-ups, physical examination, and documentation.  ------------------------------------------------  Blair Promise, PhD, MD  This document serves as a record of services personally performed by Gery Pray, MD. It was created on his behalf by Clerance Lav, a trained medical scribe. The creation of this record is based on the scribe's personal observations and the provider's statements to them. This document has been checked and approved by the attending provider.

## 2020-06-03 ENCOUNTER — Encounter: Payer: Self-pay | Admitting: Physician Assistant

## 2020-06-03 ENCOUNTER — Other Ambulatory Visit: Payer: Self-pay

## 2020-06-03 ENCOUNTER — Telehealth (INDEPENDENT_AMBULATORY_CARE_PROVIDER_SITE_OTHER): Payer: 59 | Admitting: Physician Assistant

## 2020-06-03 VITALS — BP 165/79 | HR 73 | Ht 62.0 in | Wt 230.0 lb

## 2020-06-03 DIAGNOSIS — C3490 Malignant neoplasm of unspecified part of unspecified bronchus or lung: Secondary | ICD-10-CM

## 2020-06-03 DIAGNOSIS — E119 Type 2 diabetes mellitus without complications: Secondary | ICD-10-CM

## 2020-06-03 DIAGNOSIS — I350 Nonrheumatic aortic (valve) stenosis: Secondary | ICD-10-CM

## 2020-06-03 DIAGNOSIS — G4733 Obstructive sleep apnea (adult) (pediatric): Secondary | ICD-10-CM

## 2020-06-03 DIAGNOSIS — E039 Hypothyroidism, unspecified: Secondary | ICD-10-CM

## 2020-06-03 DIAGNOSIS — I4892 Unspecified atrial flutter: Secondary | ICD-10-CM

## 2020-06-03 DIAGNOSIS — I5032 Chronic diastolic (congestive) heart failure: Secondary | ICD-10-CM

## 2020-06-03 DIAGNOSIS — Z79899 Other long term (current) drug therapy: Secondary | ICD-10-CM

## 2020-06-03 DIAGNOSIS — J449 Chronic obstructive pulmonary disease, unspecified: Secondary | ICD-10-CM

## 2020-06-03 DIAGNOSIS — Z720 Tobacco use: Secondary | ICD-10-CM

## 2020-06-03 DIAGNOSIS — I10 Essential (primary) hypertension: Secondary | ICD-10-CM

## 2020-06-03 NOTE — Progress Notes (Signed)
Virtual Visit via Video Note   This visit type was conducted due to national recommendations for restrictions regarding the COVID-19 Pandemic (e.g. social distancing) in an effort to limit this patient's exposure and mitigate transmission in our community.  Due to her co-morbid illnesses, this patient is at least at moderate risk for complications without adequate follow up.  This format is felt to be most appropriate for this patient at this time.  All issues noted in this document were discussed and addressed.  A limited physical exam was performed with this format.  Please refer to the patient's chart for her consent to telehealth for [facility].      Date:  06/03/2020   ID:  Kristin [name], DOB 03-12-1951, MRN [id number]  Patient Location: Home Provider Location: Home Office  PCP:  Patient, No Pcp Per  Cardiologist:  Ida Rogue, MD  Electrophysiologist:  None   Evaluation Performed:  Follow-Up Visit  Chief Complaint:  70 y.o. 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  Chief Complaint  Patient presents with  . Hospitalization Follow-up     History of Present Illness:    Kristin [name] is a 70 y.o. female with PMH as above. .She has a history of diastolic CHF after admission 07/04/2018 due to acute COPD and found to be volume up with successful IV diuresis.  Seen 03/2020 in [facility] ED for NSTEMI. She reported increasing volume status and CP. Echo showed nl EF, mild LVH, G1DD, mild LAE, and moderate to severe AS. She underwent subsequent R/LHC. She was found to have moderate  nonobstructive CAD with stenosis being 60% of pLAD. Cors were overall moderately calcified. Right heart cath showed mildly elevated L sided filling pressures, moderate PHTN, moderately reduced CO. Severe aortic stenosis noted with mean gradient 37mmHg and calcified valve 0.7cm2.   Outpatient TAVR evaluation performed, as well as evaluation by CTS for replacement of her aortic valve, though replacement has been complicated by recent RLL SCC diagnosis as detailed below. TAVR workup with Cardiac CTA noted a 1.9x1.4x1.6cm mass in super segmet of RLL suspicious for lung CA.   Subsequent PET scan showed 2.1x1.4cm RLL nodule that was hypermetabolic and compatible with malignancy with no appreciated nodal or mets.   She then underwent outpatient MRI of the brain to ensure no METs to the brain from her lung cancer. While in the MRI machine for her MRI brain scan, she developed CP, abdominal pain, and emesis. She was transferred to the ED with cardiology consulted, She was noted to be in atrial flutter then converted to a junctional rhythm with 4.1 second termination pauses. Coreg was discontinued 2/2 termination pauses and Losartan dose increased for BP support. She was started on an oral load of amiodarone. In the setting of lung CA workup and questionable melanotic stools, [place] deferred with recommendation for outpatient monitor to determine burden of her arrhythmia.   On ambulatory monitoring (not yet uploaded as pt needs to mail the monitor back), recurrent Afib noted with the monitoring company subsequently calling the office. Per phone documentation 1/12, our office then  Recommended discontinuation of ASA and Eliquis 5mg  BID start. She  was continued on amiodarone.   Lung bx then performed per CTS request, confirming a diagnosis of SCC of the right lower lobe. MRI of the brain was fortunately without intracranial mets. CTS recommendation was for workup/tx of SCC per oncology.   Today, 06/03/20, she is seen  via telemedicine (voice and video) and notes that she has been doing well from a cardiac standpoint.  She is joined today on the call by her daughter.  She continues her work-up with oncology and is hopeful to move from the [facility] oncology practice to that of [place].  She is unsure of her follow-up with the TAVR team and was thinking of reaching out to them regarding when next to see them in the office.  Overall, she reports her energy is improved.  No chest pain.  She denies any racing heart rate or palpitations.  No shortness of breath, dyspnea, or signs/symptoms of volume overload. Phone camera adjusted to show her legs with patient then pressing against each tibia and no overt edema noted by camera. No reported wt gain. She does not have a regular exercise program but reports she walks around the house without any exertional symptoms of chest pain or shortness of breath.  She is monitoring her blood pressure at home with report that SBP is usually above 130s with lowest SBP noted at 135.  DBP is usually in the 60s to 70s with lowest estimated at 63.  Heart rate usually is in the 60s to 90s.  She is still smoking.  She hopes to cut back and states she has decreased from 1.5 packs/day to 1 pack/day.  She is not drinking alcohol.  She does admit to drinking some coffee, as well as every now and again she continues to drink her Mango drinks.  She has realized that there is salt and limiting her drinks, however, and so cut back on them.  She is watching her salt and using Mrs. Dash.  She reports that the cardiac monitor resulted in a skin rash for her.  She is mailing it back over the next few days.  She follows up with her PCP, [name], NP and a long, in order to manage her DM2, hypothyroidism, and other comorbid conditions.  She reports medication compliance.  No signs or symptoms of bleeding.  The patient does not have symptoms concerning for COVID-19 infection (fever, chills, cough, or new  shortness of breath).   Past Medical History:  Diagnosis Date  . Anxiety   . Asthma   . CHF (congestive heart failure) ([place])    2018  . COPD (chronic obstructive pulmonary disease) ([place])   . Diabetes mellitus without complication ([place])   . Dyspnea   . GERD (gastroesophageal reflux disease)   . History of hiatal hernia   . History of orthopnea   . Hypothyroidism   . Neuropathy   . Oxygen deficiency    3L/HS  . Pain    BACK/DDD  . Peripheral vascular disease ([place])   . RLS (restless legs syndrome)   . Sleep apnea   . Wheezing    Past Surgical History:  Procedure Laterality Date  . BREAST SURGERY    . CATARACT EXTRACTION W/PHACO Right 03/02/2018   Procedure: CATARACT EXTRACTION PHACO AND INTRAOCULAR LENS PLACEMENT ([place]);  Surgeon: Marchia Meiers, MD;  Location: [facility] ORS;  Service: Ophthalmology;  Laterality: Right;  [place] 01:15 CDE 16.46 Fluid pack lot # [id number]  . CATARACT EXTRACTION W/PHACO Left 04/27/2018   Procedure: CATARACT EXTRACTION  PHACO AND INTRAOCULAR LENS PLACEMENT ([place])- LEFT DIABETIC;  Surgeon: Marchia Meiers, MD;  Location: [facility] ORS;  Service: Ophthalmology;  Laterality: Left;  Lot # [id number] H [place]: 00:46.3 CDE: 8.07   . CYST EXCISION     FOREHEAD  . FOOT SURGERY     CYST  . RIGHT/LEFT HEART CATH AND CORONARY ANGIOGRAPHY N/A 03/31/2020   Procedure: RIGHT/LEFT HEART CATH AND CORONARY ANGIOGRAPHY;  Surgeon: Wellington Hampshire, MD;  Location: [place] CV LAB;  Service: Cardiovascular;  Laterality: N/A;  . TUBAL LIGATION       No outpatient medications have been marked as taking for the 06/03/20 encounter (Telemedicine) with Arvil Chaco, PA-C.     Allergies:   Altace [ramipril]   Social History   Tobacco Use  . Smoking status: Current Every Day Smoker    Packs/day: 1.00    Years: 50.00    Pack years: 50.00    Types: Cigarettes  . Smokeless tobacco: Never Used  Substance Use Topics  . Alcohol use: Not Currently     Family Hx: The patient's  family history includes Cancer in her father; Heart disease in her mother.  ROS:   Please see the history of present illness.    Review of Systems  Constitutional: Negative for malaise/fatigue.  Respiratory: Negative for cough, hemoptysis and shortness of breath.   Cardiovascular: Negative for chest pain, palpitations, orthopnea, leg swelling and PND.  Gastrointestinal: Negative for blood in stool and melena.  Musculoskeletal: Negative for falls.  Neurological: Negative for dizziness.  All other systems reviewed and are negative.   All other systems reviewed and are negative.  Objective:    Vital Signs:  BP (!) 165/79   Pulse 73   Ht 5\' 2"  (1.575 m)   Wt 230 lb (104.3 kg)   LMP  (LMP Unknown)   BMI 42.07 kg/m    VITAL SIGNS:  reviewed GEN:  no acute distress EYES:  sclerae anicteric, EOMI - Extraocular Movements Intact SKIN:  No rash, lesions, or ulcers. No LEE (tilted camera) PSYCH:  normal affect  Accessory clinical findings reviewed:    EKG:  No ECG reviewed.  Previous vitals reviewed today:    Temp Readings from Last 3 Encounters:  05/29/20 97.7 F (36.5 C) (Oral)  05/28/20 98.8 F (37.1 C) (Oral)  05/27/20 98.1 F (36.7 C) (Oral)   BP Readings from Last 3 Encounters:  06/03/20 (!) 165/79  05/29/20 (!) 106/44  05/28/20 114/66   Pulse Readings from Last 3 Encounters:  06/03/20 73  05/29/20 66  05/28/20 79    Wt Readings from Last 3 Encounters:  06/03/20 230 lb (104.3 kg)  05/29/20 230 lb 6.4 oz (104.5 kg)  05/26/20 230 lb (104.3 kg)     Labs reviewed today:      Lab Results  Component Value Date   WBC 8.3 05/29/2020   HGB 12.4 05/29/2020   HCT 38.3 05/29/2020   MCV 92.7 05/29/2020   PLT 213 05/29/2020   Lab Results  Component Value Date   CREATININE 1.07 (H) 05/28/2020   BUN 23 05/28/2020   NA 138 05/28/2020   K 4.0 05/28/2020   CL 94 (L) 05/28/2020   CO2 32 05/28/2020   Lab Results  Component Value Date   ALT 14 03/28/2020   AST  19 03/28/2020   ALKPHOS 37 (L) 03/28/2020   BILITOT 0.5 03/28/2020   Lab Results  Component Value Date   CHOL 193 03/29/2020   HDL 52 03/29/2020  LDLCALC 113 (H) 03/29/2020   TRIG 140 03/29/2020   CHOLHDL 3.7 03/29/2020    Lab Results  Component Value Date   HGBA1C 6.0 (H) 03/28/2020   Lab Results  Component Value Date   TSH 1.133 05/26/2020     Prior CV Studies reviewed today:    The following studies were reviewed today: Ordered from most recent on top to oldest on bottom  05/29/20 biopsy done CXR IMPRESSION: 1. No pneumothorax after RIGHT lung biopsy. Expected post biopsy changes surrounding the nodule in the superior segment RIGHT LOWER LOBE. 2. Stable changes of chronic bronchitis and/or asthma.  MR brain 05/2020 without mets to brain  05/13/2020 NM PET  IMPRESSION: 1. 2.1 by 1.4 cm right lower lobe pulmonary nodule is hypermetabolic with maximum SUV of 5.4, compatible with malignancy. No appreciable nodal or metastatic spread. 2. Mildly accentuated activity along the inferior endplate of L2, probably incidental. 3. Hypodense 1.5 by 1.2 cm right inferior thyroid lobe nodule. Recommend thyroid US (ref: J Am Coll Radiol. 2015 Feb;12(2): 143-50). 4. Other imaging findings of potential clinical significance: Chronic right maxillary sinusitis. Aortic Atherosclerosis (ICD10-I70.0). Coronary atherosclerosis. Mild cardiomegaly. Mitral valve calcification. Diffuse hepatic steatosis. Cholelithiasis. Complex but photopenic left kidney upper pole cyst, most likely to be benign.  06/15/2019 CTA chest and aorta IMPRESSION: 1. Vascular findings and measurements pertinent to potential TAVR procedure, as detailed above. 2. Severe thickening calcification of the aortic valve, compatible with reported clinical history of severe aortic stenosis. 3. 1.9 x 1.4 x 1.6 cm macrolobulated nodule in the superior segment of the right lower lobe, highly concerning for primary  bronchogenic neoplasm. Further evaluation with PET-CT is recommended in the near future to better evaluate this finding and assess for potential metastatic disease. No definite metastatic disease is identified in the chest, abdomen or pelvis on today's examination. 4. Aortic atherosclerosis, in addition to left main and 3 vessel coronary artery disease. 5. Severe calcifications of the mitral annulus. 6. Severe hepatic steatosis. 7. Colonic diverticulosis without evidence of acute diverticulitis at this time. 8. Additional incidental findings, as above. These results will be called to the ordering clinician or representative by the Radiologist Assistant, and communication documented in the PACS or [employer].  CTA abdomen and pelvis 04/14/2020 IMPRESSION: 1. Vascular findings and measurements pertinent to potential TAVR procedure, as detailed above. 2. Severe thickening calcification of the aortic valve, compatible with reported clinical history of severe aortic stenosis. 3. 1.9 x 1.4 x 1.6 cm macrolobulated nodule in the superior segment of the right lower lobe, highly concerning for primary bronchogenic neoplasm. Further evaluation with PET-CT is recommended in the near future to better evaluate this finding and assess for potential metastatic disease. No definite metastatic disease is identified in the chest, abdomen or pelvis on today's examination. 4. Aortic atherosclerosis, in addition to left main and 3 vessel coronary artery disease. 5. Severe calcifications of the mitral annulus. 6. Severe hepatic steatosis. 7. Colonic diverticulosis without evidence of acute diverticulitis at this time. 8. Additional incidental findings, as above. These results will be called to the ordering clinician or representative by the Radiologist Assistant, and communication documented in the PACS or [employer].  Cardiac CTA 04/14/2020 IMPRESSION: 1. Trileaflet aortic valve  with moderately calcified leaflets and moderately restricted leaflet opening. Aortic valve calcium score 1257. (Severe if > 2000 in males). Annular measurements suitable for delivery of a 26 mm Edwards-SAPIEN 3 Ultra valve. 2. Sufficient coronary to annulus distance. 3. Optimum Fluoroscopic Angle for Delivery:  RAO 2 CRA 3. 4. No thrombus in the left atrial appendage.   US carotid 04/01/2020 IMPRESSION: Color duplex indicates minimal heterogeneous and calcified plaque, with no hemodynamically significant stenosis by duplex criteria in the extracranial cerebrovascular circulation.  R/LHC 03/31/2020  Prox RCA lesion is 30% stenosed.  Mid RCA lesion is 40% stenosed.  RPDA lesion is 30% stenosed.  Prox Cx lesion is 60% stenosed.  Mid LAD lesion is 20% stenosed.  Mid Cx to Dist Cx lesion is 20% stenosed. 1.  Mild to moderate nonobstructive coronary artery disease.  Worst stenosis is 60% in the proximal left circumflex.  No evidence of obstructive disease.  Coronary arteries are overall moderately calcified. 2.  Right heart catheterization showed moderately elevated left-sided filling pressures, moderate pulmonary hypertension and moderately reduced cardiac output. 3.  Severe aortic stenosis with mean gradient of 31 mmHg and calculated valve area of 0.7 cm. Recommendations: The patient is significantly volume overloaded.  I switched furosemide to intravenous 20 mg twice daily.  I increased carvedilol for better blood pressure control. Recommend medical therapy for nonobstructive coronary artery disease. Recommend outpatient TAVR evaluation.  Echo 03/29/2020 1. Left ventricular ejection fraction, by estimation, is 55 to 60%. The  left ventricle has normal function. The left ventricle has no regional  wall motion abnormalities. There is mild left ventricular hypertrophy.  Left ventricular diastolic parameters  are consistent with Grade I diastolic dysfunction (impaired  relaxation).  2. Right ventricular systolic function is normal. The right ventricular  size is normal. Tricuspid regurgitation signal is inadequate for assessing  PA pressure.  3. Left atrial size was mildly dilated.  4. The aortic valve was not well visualized. Moderate to severe aortic  valve stenosis. Aortic valve area, by VTI measures 1.11 cm. Aortic valve  mean gradient measures 31.0 mmHg. Aortic valve Vmax measures 3.66 m/s.    ASSESSMENT & PLAN:    Paroxysmal Atrial flutter, newly diagnosed --No EKG given telemedicine visit but reports asx with controlled rates. Recently diagnosed paroxysmal atrial flutter during an [facility] admission with subsequent conversion to junctional rhythm, followed by sinus rhythm with a 4.1 second termination pause. Coreg discontinued 2/2 post termination pauses. Ambulatory cardiac monitoring performed and mailed back but still pending upload to EMR. Notes indicate that office did receive a report, however, that indicated sufficient burden of Afib and ASA discontinued to start Eliquis 5mg  BID on 1/12.  Medications:  Amiodarone - reduced today to amiodarone 200mg  BID x7 days and then drop to 200mg  daily thereafter. Ongoing monitoring per guidelines with periodic LFTs, TSH, PFTs, CXR. Baseline LFTs and TSH wnl.  Eliquis - continue at Eliquis 5 mg BID continued. Does not qualify for reduced dosing. No s/sx of bleeding. Will obtain follow-up labs with CBC/BMET s/p 1 week since initiation. CHA2DS2VASc score of at least  4 (age, DM2, vascular, female).  Labs  CBC, BMET today.    Future   If ongoing Afib runs despite amiodarone or ongoing pauses, consider EP.  Moderate nonobstructive coronary artery disease by 03/31/2020 catheterization History of NSTEMI --No recent CP. Echo 03/2020 with EF 55-60%, no wall motion abnl, mild LVH, G1DD, AS. 03/2020 R/LHC showed cors moderately calcified.  Recommend continued medical management and aggressive risk factor  modification. Continue Eliquis in lieu of ASA. No BB in the setting of post termination pauses. Continue ARB, as needed sublingual nitro as needed for chest pain. Continue PTA atorvastatin 80mg  daily with LDL goal below 70.   Recommend smoking cessation.  Severe aortic  stenosis --S/p R/LHC with AS mean gradient of 31 mmHg and calculated valve area of 0.7 cm. Cardiac CTA performed, as well as carotids, and as copied and pasted above. OP workup has been complicated by SCC of RLL (see bx copied above).  She has been seen as OP by both the TAVR team and CTS with most recent visits reviewed in EMR. Recommend ongoing OP workup by both CTS and TAVR team as directed and with consideration of current oncology workup.   Chronic HFpEF --Denies s/sx of volume overload. Continues on 3L [place] O2 at baseline. RHC showed moderately elevated LVEDP, moderate PHTN, moderately reduced CO, severe aortic stenosis as above. Appears euvolemic at the time of our telemedicine visit. Continue current lasix 40mg  qd and losartan 50mg  daily. No BB given post-termination pauses as above.  Reviewed CHF education and recommendation for lifestyle changes. Ongoing daily wt, BP log recommended at home.  HTN ---BP sub-optimal and discussed the effect of this elevated BP on cardiovascular and renal health today. We also discussed that this is hard on her aortic valve. Recommended goal BP 130/80 or lower.   Losartan increased to 50mg  daily just before discharge. Recheck BMET for Cr/K.   Continue Lasix 40mg . Deferred from increasing lasix dosing at this time, given she denies any s/sx of volume overload, and as difficult to assess volume status via telemedicine. Also, preference would be to first have an updated BMET. Follow-up in person to recommended reassess volume status and ensure this is not the reason for her elevated BP / ensure she does not need up-titration of her lasix. Reviewed salt and fluid limitations.   Pending BMET,  consider starting spironolactone. Also discussed was start of hydralazine. Avoiding clonidine due to risk of rebound HTN. Avoiding HCTZ or chlorthalidone to prevent electrolyte abnormalities on lasix. Avoiding AV nodal blocking agents given post-termination pauses. If BP +/- rate control at RTC still needed, consider reaching out to EP for recommendations given her history as above.    SCC of RLL  --Discussed diagnosis with pt. She is hopeful to start following with oncology in [place] soon, rather than [place]. Follow-up as directed by oncology. See above for copied and pasted recent imaging and biopsy.  HLD --Continue current atorvastatin 80mg  daily. Ordered lipid and liver check for 6-8 weeks after initiation of her statin.   DM2 --Glycemic control recommended A1C 6.0. Recommend ongoing follow-up with PCP as OP.  Hypothyroidism --Most recent TSH wnl. Management per PCP.   COVID-19 Education: The signs and symptoms of COVID-19 were discussed with the patient and how to seek care for testing (follow up with PCP or arrange E-visit).  The importance of social distancing was discussed today.  Time:   Today, I have spent 30 minutes with the patient with telehealth technology discussing the above problems.     Medication Adjustments/Labs and Tests Ordered: Current medicines are reviewed at length with the patient today.  Concerns regarding medicines are outlined above.   Medication changes:  --Decrease to amiodarone 200mg  BID x7 days then drop down to 200mg  daily.  --Possible start of spironolactone versus hydralazine versus alternative agent (may need to talk with EP, given trying to avoid AV nodal blocking agents). Labs ordered: BMET, CBC, LFTs, lipids Studies / Imaging ordered: None Future considerations: AV replacement with consideration of oncology recommendations.  Disposition: In Person as already scheduled or within 2 months   Signed, Magda Bernheim  06/03/2020   [place]

## 2020-06-03 NOTE — Patient Instructions (Addendum)
Medication Instructions:   Your physician recommends that you continue on your current medications as directed. Please refer to the Current Medication list given to you today.  Pending your lab results we will discuss possible medication changes.  *If you need a refill on your cardiac medications before your next appointment, please call your pharmacy*   Lab Work:  -  Your physician recommends that you have FASTING lab work at the medical mall: Bmet, CBC, Lipid / Liver panel -  Please go to the [facility]. You will check in at the front desk to the right as you walk into the atrium. Valet Parking is offered if needed. - No appointment needed. You may go any day between 7 am and 6 pm.   If you have labs (blood work) drawn today and your tests are completely normal, you will receive your results only by: Marland Kitchen MyChart Message (if you have MyChart) OR . A paper copy in the mail If you have any lab test that is abnormal or we need to change your treatment, we will call you to review the results.   Testing/Procedures: None ordered   Follow-Up: At [facility], you and your health needs are our priority.  As part of our continuing mission to provide you with exceptional heart care, we have created designated Provider Care Teams.  These Care Teams include your primary Cardiologist (physician) and Advanced Practice Providers (APPs -  Physician Assistants and Nurse Practitioners) who all work together to provide you with the care you need, when you need it.  We recommend signing up for the patient portal called "MyChart".  Sign up information is provided on this After Visit Summary.  MyChart is used to connect with patients for Virtual Visits (Telemedicine).  Patients are able to view lab/test results, encounter notes, upcoming appointments, etc.  Non-urgent messages can be sent to your provider as well.   To learn more about what you can do with MyChart, go to [email or web address].     Your next appointment:   2 month(s)  The format for your next appointment:   In Person  Provider:   You may see Ida Rogue, MD or one of the following Advanced Practice Providers on your designated Care Team:    Murray Hodgkins, NP  Christell Faith, PA-C  Marrianne Mood, PA-C  Cadence Kathlen Mody, [place]  Laurann Montana, NP    Steps to Quit Smoking Smoking tobacco is the leading cause of preventable death. It can affect almost every organ in the body. Smoking puts you and people around you at risk for many serious, long-lasting (chronic) diseases. Quitting smoking can be hard, but it is one of the best things that you can do for your health. It is never too late to quit. How do I get ready to quit? When you decide to quit smoking, make a plan to help you succeed. Before you quit:  Pick a date to quit. Set a date within the next 2 weeks to give you time to prepare.  Write down the reasons why you are quitting. Keep this list in places where you will see it often.  Tell your family, friends, and co-workers that you are quitting. Their support is important.  Talk with your doctor about the choices that may help you quit.  Find out if your health insurance will pay for these treatments.  Know the people, places, things, and activities that make you want to smoke (triggers). Avoid them. What first steps can I  take to quit smoking?  Throw away all cigarettes at home, at work, and in your car.  Throw away the things that you use when you smoke, such as ashtrays and lighters.  Clean your car. Make sure to empty the ashtray.  Clean your home, including curtains and carpets. What can I do to help me quit smoking? Talk with your doctor about taking medicines and seeing a counselor at the same time. You are more likely to succeed when you do both.  If you are pregnant or breastfeeding, talk with your doctor about counseling or other ways to quit smoking. Do not take medicine to  help you quit smoking unless your doctor tells you to do so. To quit smoking: Quit right away  Quit smoking totally, instead of slowly cutting back on how much you smoke over a period of time.  Go to counseling. You are more likely to quit if you go to counseling sessions regularly. Take medicine You may take medicines to help you quit. Some medicines need a prescription, and some you can buy over-the-counter. Some medicines may contain a drug called nicotine to replace the nicotine in cigarettes. Medicines may:  Help you to stop having the desire to smoke (cravings).  Help to stop the problems that come when you stop smoking (withdrawal symptoms). Your doctor may ask you to use:  Nicotine patches, gum, or lozenges.  Nicotine inhalers or sprays.  Non-nicotine medicine that is taken by mouth. Find resources Find resources and other ways to help you quit smoking and remain smoke-free after you quit. These resources are most helpful when you use them often. They include:  Online chats with a Social worker.  Phone quitlines.  Printed Furniture conservator/restorer.  Support groups or group counseling.  Text messaging programs.  Mobile phone apps. Use apps on your mobile phone or tablet that can help you stick to your quit plan. There are many free apps for mobile phones and tablets as well as websites. Examples include Quit Guide from the [employer] and smokefree.gov   What things can I do to make it easier to quit?  Talk to your family and friends. Ask them to support and encourage you.  Call a phone quitline (1-800-QUIT-NOW), reach out to support groups, or work with a Social worker.  Ask people who smoke to not smoke around you.  Avoid places that make you want to smoke, such as: ? Bars. ? Parties. ? Smoke-break areas at work.  Spend time with people who do not smoke.  Lower the stress in your life. Stress can make you want to smoke. Try these things to help your stress: ? Getting regular  exercise. ? Doing deep-breathing exercises. ? Doing yoga. ? Meditating. ? Doing a body scan. To do this, close your eyes, focus on one area of your body at a time from head to toe. Notice which parts of your body are tense. Try to relax the muscles in those areas.   How will I feel when I quit smoking? Day 1 to 3 weeks Within the first 24 hours, you may start to have some problems that come from quitting tobacco. These problems are very bad 2-3 days after you quit, but they do not often last for more than 2-3 weeks. You may get these symptoms:  Mood swings.  Feeling restless, nervous, angry, or annoyed.  Trouble concentrating.  Dizziness.  Strong desire for high-sugar foods and nicotine.  Weight gain.  Trouble pooping (constipation).  Feeling like you may  vomit (nausea).  Coughing or a sore throat.  Changes in how the medicines that you take for other issues work in your body.  Depression.  Trouble sleeping (insomnia). Week 3 and afterward After the first 2-3 weeks of quitting, you may start to notice more positive results, such as:  Better sense of smell and taste.  Less coughing and sore throat.  Slower heart rate.  Lower blood pressure.  Clearer skin.  Better breathing.  Fewer sick days. Quitting smoking can be hard. Do not give up if you fail the first time. Some people need to try a few times before they succeed. Do your best to stick to your quit plan, and talk with your doctor if you have any questions or concerns. Summary  Smoking tobacco is the leading cause of preventable death. Quitting smoking can be hard, but it is one of the best things that you can do for your health.  When you decide to quit smoking, make a plan to help you succeed.  Quit smoking right away, not slowly over a period of time.  When you start quitting, seek help from your doctor, family, or friends. This information is not intended to replace advice given to you by your health  care provider. Make sure you discuss any questions you have with your health care provider. Document Revised: 01/26/2019 Document Reviewed: 07/22/2018 Elsevier Patient Education  [place].

## 2020-06-05 ENCOUNTER — Other Ambulatory Visit: Payer: Self-pay | Admitting: *Deleted

## 2020-06-05 NOTE — Progress Notes (Signed)
The proposed treatment discussed in cancer conference is for discussion purpose only and is not a binding recommendation. The patient was not physically examined nor present for their treatment options. Therefore, final treatment plans cannot be decided.  ?

## 2020-06-13 ENCOUNTER — Telehealth: Payer: Self-pay | Admitting: *Deleted

## 2020-06-13 MED ORDER — AMIODARONE HCL 200 MG PO TABS: 200.0000 mg | ORAL_TABLET | Freq: Every day | ORAL | 3 refills | Status: DC

## 2020-06-13 NOTE — Telephone Encounter (Signed)
-----   Message from Rise Mu, PA-C sent at 06/13/2020  7:23 AM EST ----- Outpatient cardiac monitoring showed a predominant rhythm sinus with an average heart rate of 75 bpm.  A. fib was noted with a 1% burden with rates ranging from 121 to 29 bpm with an average rate of 170 bpm.  The longest episode occurred 1 hour and 10 minutes.  A. fib was detected with symptomatic patient events.  9 episodes of SVT occurred.  Recommendations: -Continue amiodarone as directed by Marrianne Mood at her most recent telehealth visit -Continue anticoagulation -Follow-up labs as recommended by [facility]

## 2020-06-13 NOTE — Telephone Encounter (Signed)
Spoke with patient and reviewed results and recommendations. She verbalized understanding of instructions and states that she is still taking Amiodarone 400 mg twice a day and is now running out so will need refill. Checked with provider regarding dose and instructed patient to decrease and take Amiodarone 200 mg once daily with new prescription sent into pharmacy. Also inquired if labs have been done as well. Patient and daughter wanted to know when they can get labs done and recommended to have them done as soon as possible on Monday or Tuesday. Patient verbalized understanding of our conversation, agreement with plan, and had no further questions at this time.

## 2020-06-13 NOTE — Telephone Encounter (Signed)
Spoke with patient and she requested that I call back in 15 minutes as she is in the line to pick up her granddaughter.

## 2020-06-17 ENCOUNTER — Other Ambulatory Visit
Admission: RE | Admit: 2020-06-17 | Discharge: 2020-06-17 | Disposition: A | Discharge status: Home / Self Care | Payer: 59 | Attending: Physician Assistant | Admitting: Physician Assistant

## 2020-06-17 ENCOUNTER — Other Ambulatory Visit: Payer: Self-pay

## 2020-06-17 DIAGNOSIS — I350 Nonrheumatic aortic (valve) stenosis: Secondary | ICD-10-CM | POA: Diagnosis present

## 2020-06-17 DIAGNOSIS — I4892 Unspecified atrial flutter: Secondary | ICD-10-CM | POA: Diagnosis present

## 2020-06-17 DIAGNOSIS — Z79899 Other long term (current) drug therapy: Secondary | ICD-10-CM

## 2020-06-17 DIAGNOSIS — I10 Essential (primary) hypertension: Secondary | ICD-10-CM | POA: Diagnosis present

## 2020-06-17 DIAGNOSIS — I5032 Chronic diastolic (congestive) heart failure: Secondary | ICD-10-CM | POA: Diagnosis present

## 2020-06-17 LAB — HEPATIC FUNCTION PANEL
ALT: 20 U/L (ref 0–44)
AST: 26 U/L (ref 15–41)
Albumin: 3.6 g/dL (ref 3.5–5.0)
Alkaline Phosphatase: 33 U/L — ABNORMAL LOW (ref 38–126)
Bilirubin, Direct: <0.1 mg/dL (ref 0.0–0.2)
Total Bilirubin: 0.5 mg/dL (ref 0.3–1.2)
Total Protein: 6.6 g/dL (ref 6.5–8.1)

## 2020-06-17 LAB — BASIC METABOLIC PANEL
Anion gap: 12 (ref 5–15)
BUN: 18 mg/dL (ref 8–23)
CO2: 31 mmol/L (ref 22–32)
Calcium: 8.8 mg/dL — ABNORMAL LOW (ref 8.9–10.3)
Chloride: 100 mmol/L (ref 98–111)
Creatinine, Ser: 1.16 mg/dL — ABNORMAL HIGH (ref 0.44–1.00)
GFR, Estimated: 51 mL/min — ABNORMAL LOW (ref >60)
Glucose, Bld: 189 mg/dL — ABNORMAL HIGH (ref 70–99)
Potassium: 3.6 mmol/L (ref 3.5–5.1)
Sodium: 143 mmol/L (ref 135–145)

## 2020-06-17 LAB — CBC
HCT: 38.5 % (ref 36.0–46.0)
Hemoglobin: 12.1 g/dL (ref 12.0–15.0)
MCH: 29.5 pg (ref 26.0–34.0)
MCHC: 31.4 g/dL (ref 30.0–36.0)
MCV: 93.9 fL (ref 80.0–100.0)
Platelets: 209 10*3/uL (ref 150–400)
RBC: 4.1 MIL/uL (ref 3.87–5.11)
RDW: 14.6 % (ref 11.5–15.5)
WBC: 6.3 10*3/uL (ref 4.0–10.5)
nRBC: 0 % (ref 0.0–0.2)

## 2020-06-17 LAB — LIPID PANEL
Cholesterol: 164 mg/dL (ref 0–200)
HDL: 59 mg/dL (ref >40)
LDL Cholesterol: 86 mg/dL (ref 0–99)
Total CHOL/HDL Ratio: 2.8 RATIO
Triglycerides: 94 mg/dL (ref <150)
VLDL: 19 mg/dL (ref 0–40)

## 2020-06-18 ENCOUNTER — Telehealth: Payer: Self-pay | Admitting: *Deleted

## 2020-06-18 DIAGNOSIS — I5032 Chronic diastolic (congestive) heart failure: Secondary | ICD-10-CM

## 2020-06-18 DIAGNOSIS — Z79899 Other long term (current) drug therapy: Secondary | ICD-10-CM

## 2020-06-18 DIAGNOSIS — E785 Hyperlipidemia, unspecified: Secondary | ICD-10-CM

## 2020-06-18 MED ORDER — IRBESARTAN 300 MG PO TABS: 300.0000 mg | ORAL_TABLET | Freq: Every day | ORAL | 3 refills | Status: DC

## 2020-06-18 MED ORDER — POTASSIUM CHLORIDE CRYS ER 20 MEQ PO TBCR: 20.0000 meq | EXTENDED_RELEASE_TABLET | Freq: Two times a day (BID) | ORAL | 0 refills | Status: DC

## 2020-06-18 MED ORDER — EZETIMIBE 10 MG PO TABS: 10.0000 mg | ORAL_TABLET | Freq: Every day | ORAL | 3 refills | Status: DC

## 2020-06-18 MED ORDER — FUROSEMIDE 20 MG PO TABS: 20.0000 mg | ORAL_TABLET | Freq: Every day | ORAL | 3 refills | Status: DC

## 2020-06-18 NOTE — Addendum Note (Signed)
Addended by: Darlyne Russian on: 06/18/2020 03:25 PM   Modules accepted: Orders

## 2020-06-18 NOTE — Telephone Encounter (Signed)
Correction to message below. Pt will have repeat Lipid/Liver panel in 6-8 weeks, not Bmet. Orders updated.

## 2020-06-18 NOTE — Telephone Encounter (Signed)
-----   Message from Arvil Chaco, PA-C sent at 06/18/2020 12:04 AM EST ----- Cholesterol labs show --Total cholesterol improved from 193 --> 164 --Triglycerides improved from 140 --> 94 --LDL (bade cholesterol)  improved from 113 --> 86 but still not yet at goal of below 70.  Chemistry labs showed --Potassium low at 3.6. --Glucose elevated at 189. --Renal function slightly bumped from previous . --Low calcium  Blood counts and liver numbers --Counts / numbers stable.  Recommendations: --Add Zetia 10mg  daily to get LDL at goal --Repeat BMET 6-8 weeks --Potassium supplement of kcl tab 6mEq twice daily x1 day only. --Decrease down to lasix 20mg  daily. If wt increase more than 3 lbs overnight or 5 lbs in a week, take 1/2 extra lasix (or 40mg  total for the day) but call the office if have to take an extra lasix for more than 3 days in a row, as we will need to recheck the kidneys and electrolytes at that time.  --Stop losartan 50mg  daily  --Start irbesartan 300mg  daily. --BMET in 1 week to recheck kidneys and potassium --Ensure checking glucose levels at home

## 2020-06-18 NOTE — Telephone Encounter (Signed)
Spoke to pt, notified of results and provider's recc below. Discussed in detail to ensure pt and daughter understand recc and medication changes. Pt and daughter wrote down instructions and verbalized understanding with read back.   Pt will STOP Losartan. START Irbesartain 300mg  daily. START Zetia 10mg  daily Kcl 20 mEq TWICE daily for ONE day Decrease Lasix to 20mg  daily and only take 1/2 tablet extra (or only 20mg  tablet extra depending on which Rx she is using) AS NEEDED for wt gain of 3 lbs overnight or 5 lbs in one week. She will call our office if she has had to take an extra 20mg  for more than 3 days.   She will get BMET in 1 week at the [facility]. Also will have repeat BMET in 6-8 weeks at the medical mall as well.   Pt has been using her daughter's glucose meter to check glucose daily. She is going to reach out to her PCP to have a new one ordered.   New Rx's sent to [facility] in [place] per pt request.  Lab orders placed.   Pt has no further questions at this time.

## 2020-06-23 ENCOUNTER — Ambulatory Visit: Payer: 59

## 2020-06-23 ENCOUNTER — Ambulatory Visit
Admission: RE | Admit: 2020-06-23 | Discharge: 2020-06-23 | Disposition: A | Discharge status: Home / Self Care | Payer: 59 | Source: Ambulatory Visit | Attending: Radiation Oncology | Admitting: Radiation Oncology

## 2020-06-23 NOTE — Progress Notes (Incomplete)
Radiation Oncology         [phone] ________________________________  Initial Outpatient Consultation  Name: Kristin [name] MRN: [id number]  Date: 06/23/2020  DOB: [birth year]  CC:Pcp, No  Rexene Alberts, MD   REFERRING PHYSICIAN: Rexene Alberts, MD  DIAGNOSIS: There were no encounter diagnoses.  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 female who is seen as a courtesy of Dr. Roxy Manns for an opinion concerning radiation therapy as part of management for her recently diagnosed lung cancer. Today, she is accompanied by ***. The patient underwent a CTA of chest, abdomen, and pelvis on 04/08/2020 as part of a pre-procedural study prior to potential transcatheter aortic valve replacement procedure. Results incidentally showed a 1.9 x 1.4 x 1.6 cm macrolobulated nodule in the superior segment of the right lower lobe that was highly concerning for primary bronchogenic neoplasm. No definite metastatic disease was identified in the chest, abdomen, or pelvis.  PET scan on 05/13/2020 showed a hypermetabolic 2.1 x 1.4 cm right lower lobe pulmonary nodule that was compatible with malignancy. There was no appreciable nodal or metastatic spread. However, there was mildly accentuated activity along the inferior endplate of L2, probably incidental. Additionally, there was a hypodense 1.5 x 1.2 cm right inferior thyroid lobe nodule.  MRI of brain on 05/26/2020 showed chronic microvascular ischemic change in the white matter without acute infarct or metastatic disease to the brain.  CT-guided biopsy of the right lower lobe nodule was performed on 05/29/2020 by Dr. Annamaria Boots. Pathology from the procedure revealed invasive moderately differentiated squamous cell carcinoma.  PREVIOUS RADIATION THERAPY: No  PAST MEDICAL HISTORY:  Past Medical History:  Diagnosis Date  . Anxiety   . Asthma   . CHF (congestive heart  failure) ([place])    2018  . COPD (chronic obstructive pulmonary disease) ([place])   . Diabetes mellitus without complication ([place])   . Dyspnea   . GERD (gastroesophageal reflux disease)   . History of hiatal hernia   . History of orthopnea   . Hypothyroidism   . Neuropathy   . Oxygen deficiency    3L/HS  . Pain    BACK/DDD  . Peripheral vascular disease ([place])   . RLS (restless legs syndrome)   . Sleep apnea   . Wheezing     PAST SURGICAL HISTORY: Past Surgical History:  Procedure Laterality Date  . BREAST SURGERY    . CATARACT EXTRACTION W/PHACO Right 03/02/2018   Procedure: CATARACT EXTRACTION PHACO AND INTRAOCULAR LENS PLACEMENT ([place]);  Surgeon: Marchia Meiers, MD;  Location: [facility] OR[facility];  Service: Ophthalmology;  Laterality: Right;  [place] 01:15 CDE 16.46 Fluid pack lot # [id number]  . CATARACT EXTRACTION W/PHACO Left 04/27/2018   Procedure: CATARACT EXTRACTION PHACO AND INTRAOCULAR LENS PLACEMENT ([place])- LEFT DIABETIC;  Surgeon: Marchia Meiers, MD;  Location: [facility] OR[facility];  Service: Ophthalmology;  Laterality: Left;  Lot # [id number] [place]: 00:46.3 CDE: 8.07   . CYST EXCISION     FOREHEAD  . FOOT SURGERY     CYST  . RIGHT/LEFT HEART CATH AND CORONARY ANGIOGRAPHY N/A 03/31/2020   Procedure: RIGHT/LEFT HEART CATH AND CORONARY ANGIOGRAPHY;  Surgeon: Wellington Hampshire, MD;  Location: [facility];  Service: Cardiovascular;  Laterality: N/A;  . TUBAL LIGATION      FAMILY HISTORY:  Family History  Problem Relation Age of Onset  . Heart disease Mother   . Cancer Father  SOCIAL HISTORY:  Social History   Tobacco Use  . Smoking status: Current Every Day Smoker    Packs/day: 1.00    Years: 50.00    Pack years: 50.00    Types: Cigarettes  . Smokeless tobacco: Never Used  Substance Use Topics  . Alcohol use: Not Currently    ALLERGIES:  Allergies  Allergen Reactions  . Altace [Ramipril] Swelling    MEDICATIONS:  Current Outpatient Medications  Medication Sig Dispense  Refill  . albuterol (VENTOLIN HFA) 108 (90 Base) MCG/ACT inhaler Inhale 2 puffs into the lungs every 4 (four) hours as needed for wheezing or shortness of breath.     Marland Kitchen amiodarone (PACERONE) 200 MG tablet Take 1 tablet (200 mg total) by mouth daily. 90 tablet 3  . apixaban (ELIQUIS) 5 MG TABS tablet Take 1 tablet (5 mg total) by mouth 2 (two) times daily. 60 tablet 5  . atorvastatin (LIPITOR) 80 MG tablet Take 80 mg by mouth at bedtime.     Marland Kitchen esomeprazole (NEXIUM) 40 MG capsule Take 40 mg by mouth daily.    Marland Kitchen ezetimibe (ZETIA) 10 MG tablet Take 1 tablet (10 mg total) by mouth daily. 90 tablet 3  . famotidine (PEPCID) 40 MG tablet Take 40 mg by mouth daily.    . fenofibrate 160 MG tablet Take 160 mg by mouth daily.    . furosemide (LASIX) 20 MG tablet Take 1 tablet (20 mg total) by mouth daily. 90 tablet 3  . gabapentin (NEURONTIN) 600 MG tablet Take 600 mg by mouth 2 (two) times daily.     . hydrOXYzine (VISTARIL) 50 MG capsule Take 50 mg by mouth every 6 (six) hours as needed for anxiety.    . irbesartan (AVAPRO) 300 MG tablet Take 1 tablet (300 mg total) by mouth daily. 90 tablet 3  . levothyroxine (SYNTHROID, LEVOTHROID) 75 MCG tablet Take 75 mcg by mouth daily before breakfast.    . lidocaine (LIDODERM) 5 % SMARTSIG:Patch(s) Topical    . Melatonin 10 MG TABS Take 10 mg by mouth at bedtime as needed (sleep).    . metFORMIN (GLUCOPHAGE) 500 MG tablet Take 500 mg by mouth 2 (two) times daily.    Marland Kitchen oxyCODONE-acetaminophen (PERCOCET) 10-325 MG tablet Take 1 tablet by mouth 4 (four) times daily as needed for moderate pain or severe pain.    . potassium chloride SA (KLOR-CON) 20 MEQ tablet Take 1 tablet (20 mEq total) by mouth 2 (two) times daily. For one day. 2 tablet 0  . sitaGLIPtin (JANUVIA) 100 MG tablet Take 100 mg by mouth daily.    . Tiotropium Bromide Monohydrate 2.5 MCG/ACT AERS Inhale 2 puffs into the lungs daily.      No current facility-administered medications for this encounter.     REVIEW OF SYSTEMS:  A 10+ POINT REVIEW OF SYSTEMS WAS OBTAINED including neurology, dermatology, psychiatry, cardiac, respiratory, lymph, extremities, GI, GU, musculoskeletal, constitutional, reproductive, HEENT. ***   PHYSICAL EXAM:  vitals were not taken for this visit.   General: Alert and oriented, in no acute distress HEENT: Head is normocephalic. Extraocular movements are intact. Oropharynx is clear. Neck: Neck is supple, no palpable cervical or supraclavicular lymphadenopathy. Heart: Regular in rate and rhythm with no murmurs, rubs, or gallops. Chest: Clear to auscultation bilaterally, with no rhonchi, wheezes, or rales. Abdomen: Soft, nontender, nondistended, with no rigidity or guarding. Extremities: No cyanosis or edema. Lymphatics: see Neck Exam Skin: No concerning lesions. Musculoskeletal: symmetric strength and muscle tone throughout. Neurologic:  Cranial nerves II through XII are grossly intact. No obvious focalities. Speech is fluent. Coordination is intact. Psychiatric: Judgment and insight are intact. Affect is appropriate. ***  ECOG = ***  0 - Asymptomatic (Fully active, able to carry on all predisease activities without restriction)  1 - Symptomatic but completely ambulatory (Restricted in physically strenuous activity but ambulatory and able to carry out work of a light or sedentary nature. For example, light housework, office work)  2 - Symptomatic, <50% in bed during the day (Ambulatory and capable of all self care but unable to carry out any work activities. Up and about more than 50% of waking hours)  3 - Symptomatic, >50% in bed, but not bedbound (Capable of only limited self-care, confined to bed or chair 50% or more of waking hours)  4 - Bedbound (Completely disabled. Cannot carry on any self-care. Totally confined to bed or chair)  5 - Death   Eustace Pen MM, Creech RH, Tormey DC, et al. [phone]). "Toxicity and response criteria of the [facility]". [place] Oncol. 5 (6): 649-55  LABORATORY DATA:  Lab Results  Component Value Date   WBC 6.3 06/17/2020   HGB 12.1 06/17/2020   HCT 38.5 06/17/2020   MCV 93.9 06/17/2020   PLT 209 06/17/2020   NEUTROABS 7.5 07/04/2018   Lab Results  Component Value Date   NA 143 06/17/2020   K 3.6 06/17/2020   CL 100 06/17/2020   CO2 31 06/17/2020   GLUCOSE 189 (H) 06/17/2020   CREATININE 1.16 (H) 06/17/2020   CALCIUM 8.8 (L) 06/17/2020      RADIOGRAPHY: DG Chest 1 View  Result Date: 05/26/2020 CLINICAL DATA:  Chest pain EXAM: CHEST  1 VIEW COMPARISON:  March 28, 2020 chest radiograph and chest CT April 08, 2020; PET-CT May 13, 2020 FINDINGS: Mass noted on recent prior CT examinations in the right lower lobe is less well delineated by radiography. It is seen at the hilar level on frontal view measuring 1.8 x 1.4 cm. Lungs elsewhere are clear. Heart is mildly enlarged with pulmonary vascularity normal. No adenopathy. There is aortic atherosclerosis. No bone lesions. No pneumothorax. IMPRESSION: Nodular opacity on the right seen lateral to the right hilum on frontal radiograph, measuring 1.8 x 1.4 cm. This lesion is better delineated on recent CT examinations. No edema or airspace opacity. Stable cardiac enlargement. Aortic Atherosclerosis (ICD10-I70.0). Electronically Signed   By: Lowella Grip III M.D.   On: 05/26/2020 10:13   MR BRAIN W WO CONTRAST  Result Date: 05/26/2020 CLINICAL DATA:  Lung cancer.  Staging. EXAM: MRI HEAD WITHOUT AND WITH CONTRAST TECHNIQUE: Multiplanar, multiecho pulse sequences of the brain and surrounding structures were obtained without and with intravenous contrast. CONTRAST:  44mL GADAVIST GADOBUTROL 1 MMOL/ML IV SOLN COMPARISON:  None. FINDINGS: Brain: Image quality degraded by mild motion Ventricle size normal. Negative for acute infarct. Negative for hemorrhage or mass. Moderate white matter changes most consistent with chronic  microvascular ischemia. No enhancing metastatic deposits identified. Vascular: Normal arterial flow voids. Skull and upper cervical spine: No focal skeletal lesion. Sinuses/Orbits: Mild mucosal edema paranasal sinuses. Air-fluid level right maxillary sinus. Bilateral cataract extraction Other: None IMPRESSION: Negative for metastatic disease to the brain. Chronic microvascular ischemic change in the white matter without acute infarct. Electronically Signed   By: Franchot Gallo M.D.   On: 05/26/2020 15:38   CT BIOPSY  Result Date: 05/29/2020 INDICATION: Right lower lobe superior segment PET positive lung nodule EXAM:  CT-GUIDED BIOPSY RIGHT LOWER LOBE SUPERIOR SEGMENT NODULE MEDICATIONS: 1% LIDOCAINE LOCAL ANESTHESIA/SEDATION: 1.0 mg IV Versed; 25 mcg IV Fentanyl Moderate Sedation Time:  13 MINUTES The patient was continuously monitored during the procedure by the interventional radiology nurse under my direct supervision. PROCEDURE: The procedure, risks, benefits, and alternatives were explained to the patient. Questions regarding the procedure were encouraged and answered. The patient understands and consents to the procedure. previous imaging reviewed. patient positioned right side down decubitus. noncontrast localization ct performed. the right lower lobe superior segment nodule was localized and marked for a posterior approach. under sterile conditions and local anesthesia, a 17 gauge guide needle was advanced to the nodule. needle position confirmed along the medial edge. 1 2 cm 18 gauge core biopsy obtained. this was intact and non fragmented. samples placed in formalin. postprocedure imaging demonstrates a small amount of surrounding hemorrhage related to the biopsy. needle tract occluded with the bio sentry device. postprocedure imaging demonstrates no effusion or pneumothorax. Patient tolerated the procedure well without complication. Vital sign monitoring by nursing staff during the procedure will  continue as patient is in the special procedures unit for post procedure observation. FINDINGS: The images document guide needle placement within the right lower lobe superior segment nodule. Post biopsy images demonstrate small amount of surrounding pulmonary hemorrhage. COMPLICATIONS: None immediate. IMPRESSION: Successful CT-guided core biopsy of the right lower lobe superior segment nodule Electronically Signed   By: Jerilynn Mages.  Shick M.D.   On: 05/29/2020 13:39   DG Chest Port 1 View  Result Date: 05/29/2020 CLINICAL DATA:  Post CT-guided biopsy of a RIGHT lung nodule. EXAM: PORTABLE CHEST 1 VIEW COMPARISON:  Images at the time of the CT biopsy earlier same day. Chest x-ray 05/26/2020 and earlier. CTA chest 04/08/2020. FINDINGS: No evidence of pneumothorax after RIGHT lung biopsy. Expected post biopsy changes surrounding the nodule in the superior segment RIGHT LOWER LOBE. No new pulmonary parenchymal abnormalities elsewhere. Prominent bronchovascular markings diffusely and moderate central peribronchial thickening, unchanged. IMPRESSION: 1. No pneumothorax after RIGHT lung biopsy. Expected post biopsy changes surrounding the nodule in the superior segment RIGHT LOWER LOBE. 2. Stable changes of chronic bronchitis and/or asthma. Electronically Signed   By: Evangeline Dakin M.D.   On: 05/29/2020 13:07   LONG TERM MONITOR-LIVE TELEMETRY (3-14 DAYS)  Result Date: 06/12/2020 Event monitor Normal sinus rhythm Patient had a min HR of 56 bpm, max HR of 229 bpm, and avg HR of 75 bpm. Atrial Fibrillation occurred (1% burden), ranging from 121-229 bpm (avg of 170 bpm), the longest lasting 1 hour 10 mins with an avg rate of 170 bpm. Atrial Fibrillation was detected within +/- 45 seconds of symptomatic patient event(s). 9 Supraventricular Tachycardia runs occurred, the run with the fastest interval lasting 4 beats with a max rate of 200 bpm, the longest lasting 9 beats with an avg rate of 96 bpm. Isolated SVEs were rare  (<1.0%), SVE Couplets were rare (<1.0%), and SVE Triplets were rare (<1.0%). Isolated VEs were rare (<1.0%), VE Couplets were rare (<1.0%), and no VE Triplets were present. 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  We discussed the natural history of lung cancer and general treatment, highlighting the role of radiotherapy in the management.  We discussed the available radiation techniques, and focused on the  details of logistics and delivery.  We reviewed the anticipated acute and late sequelae associated with radiation in this setting.  The patient was encouraged to ask questions that I answered to the best of my ability. *** A patient consent form was discussed and signed.  We retained a copy for our records.  The patient would like to proceed with radiation and will be scheduled for CT simulation.  PLAN: ***   Total time spent in this encounter was *** minutes which included reviewing the patient's most recent CTA of chest/abdomen/pelvis, PET scan, brain MRI, biopsy, pathology report, consultations, follow-ups, physical examination, and documentation.  ------------------------------------------------  Blair Promise, PhD, MD  This document serves as a record of services personally performed by Gery Pray, MD. It was created on his behalf by Clerance Lav, a trained medical scribe. The creation of this record is based on the scribe's personal observations and the provider's statements to them. This document has been checked and approved by the attending provider.

## 2020-06-25 ENCOUNTER — Ambulatory Visit: Payer: 59 | Admitting: Radiation Oncology

## 2020-06-25 ENCOUNTER — Ambulatory Visit: Payer: 59

## 2020-06-26 NOTE — Progress Notes (Signed)
Thoracic Location of Tumor / Histology:  Right lung   Biopsies of 05/29/2020  FINAL MICROSCOPIC DIAGNOSIS:   A. LUNG, RIGHT LOWER LOBE, BIOPSY:  - Invasive moderately differentiated squamous cell carcinoma, see  comment   Tobacco/Marijuana/Snuff/ETOH [place] Patient stats that she smokes a pack of cigarettes a day.  Past/Anticipated interventions by cardiothoracic surgery, if any:   Past/Anticipated interventions by medical oncology, if any:   Signs/Symptoms  Weight changes, if any: Patient states that she has lost 2 pounds.  Respiratory complaints, if any: Patient states that she is on 3 liters of oxygen. States that she gets sob with activity and at rest.  Hemoptysis, if any: None  Pain issues, if any:   None  SAFETY ISSUES:  Prior radiation?  No  Pacemaker/ICD?  No  Possible current pregnancy? No  Is the patient on methotrexate?  No  Current Complaints / other details:  Vitals:   07/02/20 1038  BP: (!) 154/51  Pulse: 73  Resp: 20  Temp: (!) 97.3 F (36.3 C)  TempSrc: Temporal  SpO2: 94%  Weight: 110.2 kg  Height: 5\' 2"  (1.575 m)

## 2020-06-27 ENCOUNTER — Other Ambulatory Visit: Payer: Self-pay

## 2020-06-27 ENCOUNTER — Encounter: Payer: 59 | Admitting: Cardiovascular Disease

## 2020-06-27 ENCOUNTER — Ambulatory Visit: Admit: 2020-06-27 | Payer: 59

## 2020-06-27 ENCOUNTER — Other Ambulatory Visit
Admission: RE | Admit: 2020-06-27 | Discharge: 2020-06-27 | Disposition: A | Discharge status: Home / Self Care | Payer: 59 | Attending: Physician Assistant | Admitting: Physician Assistant

## 2020-06-27 DIAGNOSIS — Z79899 Other long term (current) drug therapy: Secondary | ICD-10-CM | POA: Diagnosis present

## 2020-06-27 DIAGNOSIS — E785 Hyperlipidemia, unspecified: Secondary | ICD-10-CM | POA: Diagnosis present

## 2020-06-27 DIAGNOSIS — I5032 Chronic diastolic (congestive) heart failure: Secondary | ICD-10-CM | POA: Insufficient documentation

## 2020-06-27 LAB — BASIC METABOLIC PANEL
Anion gap: 13 (ref 5–15)
BUN: 34 mg/dL — ABNORMAL HIGH (ref 8–23)
CO2: 33 mmol/L — ABNORMAL HIGH (ref 22–32)
Calcium: 8.6 mg/dL — ABNORMAL LOW (ref 8.9–10.3)
Chloride: 96 mmol/L — ABNORMAL LOW (ref 98–111)
Creatinine, Ser: 1.76 mg/dL — ABNORMAL HIGH (ref 0.44–1.00)
GFR, Estimated: 31 mL/min — ABNORMAL LOW (ref >60)
Glucose, Bld: 125 mg/dL — ABNORMAL HIGH (ref 70–99)
Potassium: 4.3 mmol/L (ref 3.5–5.1)
Sodium: 142 mmol/L (ref 135–145)

## 2020-06-27 LAB — HEPATIC FUNCTION PANEL
ALT: 18 U/L (ref 0–44)
AST: 18 U/L (ref 15–41)
Albumin: 3.6 g/dL (ref 3.5–5.0)
Alkaline Phosphatase: 28 U/L — ABNORMAL LOW (ref 38–126)
Bilirubin, Direct: <0.1 mg/dL (ref 0.0–0.2)
Total Bilirubin: 0.3 mg/dL (ref 0.3–1.2)
Total Protein: 6.6 g/dL (ref 6.5–8.1)

## 2020-06-27 LAB — LIPID PANEL
Cholesterol: 165 mg/dL (ref 0–200)
HDL: 61 mg/dL (ref >40)
LDL Cholesterol: 91 mg/dL (ref 0–99)
Total CHOL/HDL Ratio: 2.7 RATIO
Triglycerides: 64 mg/dL (ref <150)
VLDL: 13 mg/dL (ref 0–40)

## 2020-06-30 ENCOUNTER — Telehealth: Payer: Self-pay | Admitting: *Deleted

## 2020-06-30 DIAGNOSIS — I5032 Chronic diastolic (congestive) heart failure: Secondary | ICD-10-CM

## 2020-06-30 DIAGNOSIS — Z79899 Other long term (current) drug therapy: Secondary | ICD-10-CM

## 2020-06-30 MED ORDER — IRBESARTAN 300 MG PO TABS: 150.0000 mg | ORAL_TABLET | Freq: Every day | ORAL | 3 refills | Status: DC

## 2020-06-30 NOTE — Telephone Encounter (Signed)
No answer. Left message to call back.   

## 2020-06-30 NOTE — Telephone Encounter (Signed)
-----   Message from Loel Dubonnet, NP sent at 06/30/2020 10:11 AM EST ----- Liver function stable. Appears lipid panel was collected to early as Zetia was just started 06/18/20. We will plan to collect at her upcoming appt in March to ensure she is on the Zetia for 6-8 weeks before rechecking labs. Additional lab work shows normal electrolytes. Kidney function declined compared to previous. Reduce Irbesartan to 150mg  daily (half tablet). Hold Metformin, Lasix for 2 days. Repeat BMP in 1 week.

## 2020-06-30 NOTE — Telephone Encounter (Signed)
Loel Dubonnet, NP  06/30/2020 10:11 AM EST      Correction, please repeat BMP Friday in [place] (07/04/20)

## 2020-06-30 NOTE — Telephone Encounter (Signed)
Thank you :)

## 2020-06-30 NOTE — Telephone Encounter (Signed)
Patient calling to discuss recent testing results  ° °Please call  ° °

## 2020-06-30 NOTE — Telephone Encounter (Signed)
I spoke with the patient regarding her lab results and Caitlin's recommendations to: 1) Decrease Irbesartan 300 mg to - 0.5 tablet (150 mg) once daily 2) Hold metformin & lasix x 2 days 3) repeat a BMP this Friday 07/04/20  The patient voices understanding of the above recommendations and is agreeable.

## 2020-07-01 ENCOUNTER — Telehealth: Payer: Self-pay | Admitting: Physician Assistant

## 2020-07-01 NOTE — Telephone Encounter (Signed)
Pt c/o swelling: STAT is pt has developed SOB within 24 hours  1) How much weight have you gained and in what time span? No weight gain, recent medication change  2) If swelling, where is the swelling located? stomach  3) Are you currently taking a fluid pill? yes  4) Are you currently SOB? A little  5) Do you have a log of your daily weights (if so, list)?   2/15 242  2/14 244  6) Have you gained 3 pounds in a day or 5 pounds in a week? no  7) Have you traveled recently? no

## 2020-07-01 NOTE — Telephone Encounter (Signed)
Spoke to pt. Bmet on 2/11 shows decline in renal fxn (creat 1.76).  Pt was advised to hold Lasix x 2 days then repeat Bmet on 2/18.   Pt states that she did take Lasix this morning. Pt will HOLD Lasix starting tomorrow 2/16 and 2/17.  Then will have Bmet 2/18.  Pt states noted swelling in abdomen yesterday afternoon. Denies swelling in BLE or any other area, denies shortness of breath.  Wt has decr 2 lbs since yesterday as well.   Notified pt that her wt is decr as desired. Did confirm with provider that pt will still need to have Bmet on Friday although she has not stopped Lasix as of yet. Pt does confirm she will hold for next two days. Pt states that she did have some chocolate yesterday and may have caused swelling in abdomen.   Advised pt to continue monitoring sodium intake, monitor daily weights, and call our office with any changes prior to repeat lab work on 2/18. Pt verbalized understanding and has no further questions or concerns at this time.

## 2020-07-02 ENCOUNTER — Ambulatory Visit
Admission: RE | Admit: 2020-07-02 | Discharge: 2020-07-02 | Disposition: A | Discharge status: Home / Self Care | Payer: 59 | Source: Ambulatory Visit | Attending: Radiation Oncology | Admitting: Radiation Oncology

## 2020-07-02 ENCOUNTER — Encounter: Payer: Self-pay | Admitting: Radiation Oncology

## 2020-07-02 ENCOUNTER — Other Ambulatory Visit: Payer: Self-pay

## 2020-07-02 VITALS — BP 154/51 | HR 73 | Temp 97.3°F | Resp 20 | Ht 62.0 in | Wt 243.0 lb

## 2020-07-02 DIAGNOSIS — E119 Type 2 diabetes mellitus without complications: Secondary | ICD-10-CM | POA: Diagnosis not present

## 2020-07-02 DIAGNOSIS — I509 Heart failure, unspecified: Secondary | ICD-10-CM | POA: Insufficient documentation

## 2020-07-02 DIAGNOSIS — G2581 Restless legs syndrome: Secondary | ICD-10-CM | POA: Insufficient documentation

## 2020-07-02 DIAGNOSIS — C3431 Malignant neoplasm of lower lobe, right bronchus or lung: Secondary | ICD-10-CM | POA: Insufficient documentation

## 2020-07-02 DIAGNOSIS — Z809 Family history of malignant neoplasm, unspecified: Secondary | ICD-10-CM | POA: Diagnosis not present

## 2020-07-02 DIAGNOSIS — E039 Hypothyroidism, unspecified: Secondary | ICD-10-CM | POA: Insufficient documentation

## 2020-07-02 DIAGNOSIS — F17210 Nicotine dependence, cigarettes, uncomplicated: Secondary | ICD-10-CM | POA: Insufficient documentation

## 2020-07-02 DIAGNOSIS — C3491 Malignant neoplasm of unspecified part of right bronchus or lung: Secondary | ICD-10-CM

## 2020-07-02 DIAGNOSIS — G4730 Sleep apnea, unspecified: Secondary | ICD-10-CM | POA: Insufficient documentation

## 2020-07-02 DIAGNOSIS — Z79899 Other long term (current) drug therapy: Secondary | ICD-10-CM | POA: Insufficient documentation

## 2020-07-02 DIAGNOSIS — I6782 Cerebral ischemia: Secondary | ICD-10-CM | POA: Insufficient documentation

## 2020-07-02 DIAGNOSIS — G629 Polyneuropathy, unspecified: Secondary | ICD-10-CM | POA: Diagnosis not present

## 2020-07-02 DIAGNOSIS — K219 Gastro-esophageal reflux disease without esophagitis: Secondary | ICD-10-CM | POA: Insufficient documentation

## 2020-07-02 DIAGNOSIS — F419 Anxiety disorder, unspecified: Secondary | ICD-10-CM | POA: Diagnosis not present

## 2020-07-02 DIAGNOSIS — J449 Chronic obstructive pulmonary disease, unspecified: Secondary | ICD-10-CM | POA: Insufficient documentation

## 2020-07-02 DIAGNOSIS — I739 Peripheral vascular disease, unspecified: Secondary | ICD-10-CM | POA: Diagnosis not present

## 2020-07-02 DIAGNOSIS — Z7984 Long term (current) use of oral hypoglycemic drugs: Secondary | ICD-10-CM | POA: Insufficient documentation

## 2020-07-02 NOTE — Progress Notes (Signed)
Radiation Oncology         [phone] ________________________________  Initial Outpatient Consultation  Name: Kristin [name] MRN: [id number]  Date: 07/02/2020  DOB: 01/08/1951  CC:Pcp, No  Rexene Alberts, MD   REFERRING PHYSICIAN: Rexene Alberts, MD  DIAGNOSIS: The encounter diagnosis was Malignant neoplasm of right lung, unspecified part of lung ([place]).  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 female who is seen as a courtesy of Dr. Roxy Manns for an opinion concerning radiation therapy as part of management for her recently diagnosed lung cancer. Today, she is accompanied by one of her daughters. The patient underwent a CTA of chest, abdomen, and pelvis on 04/08/2020 as part of a pre-procedural study prior to potential transcatheter aortic valve replacement procedure. Results incidentally showed a 1.9 x 1.4 x 1.6 cm macrolobulated nodule in the superior segment of the right lower lobe that was highly concerning for primary bronchogenic neoplasm. No definite metastatic disease was identified in the chest, abdomen, or pelvis.  PET scan on 05/13/2020 showed a hypermetabolic 2.1 x 1.4 cm right lower lobe pulmonary nodule that was compatible with malignancy. There was no appreciable nodal or metastatic spread. However, there was mildly accentuated activity along the inferior endplate of L2, probably incidental. Additionally, there was a hypodense 1.5 x 1.2 cm right inferior thyroid lobe nodule.  MRI of brain on 05/26/2020 showed chronic microvascular ischemic change in the white matter without acute infarct or metastatic disease to the brain.  CT-guided biopsy of the right lower lobe nodule was performed on 05/29/2020 by Dr. Annamaria Boots. Pathology from the procedure revealed invasive moderately differentiated squamous cell carcinoma.  PREVIOUS RADIATION THERAPY: No  PAST MEDICAL  HISTORY:  Past Medical History:  Diagnosis Date  . Anxiety   . Asthma   . CHF (congestive heart failure) ([place])    2018  . COPD (chronic obstructive pulmonary disease) ([place] )   . Diabetes mellitus without complication ([place])   . Dyspnea   . GERD (gastroesophageal reflux disease)   . History of hiatal hernia   . History of orthopnea   . Hypothyroidism   . Neuropathy   . Oxygen deficiency    3L/HS  . Pain    BACK/DDD  . Peripheral vascular disease ([place])   . RLS (restless legs syndrome)   . Sleep apnea   . Wheezing     PAST SURGICAL HISTORY: Past Surgical History:  Procedure Laterality Date  . BREAST SURGERY    . CATARACT EXTRACTION W/PHACO Right 03/02/2018   Procedure: CATARACT EXTRACTION PHACO AND INTRAOCULAR LENS PLACEMENT ([place]);  Surgeon: Marchia Meiers, MD;  Location: [facility];  Service: Ophthalmology;  Laterality: Right;  [place] 01:15 CDE 16.46 Fluid pack lot # [id number]  . CATARACT EXTRACTION W/PHACO Left 04/27/2018   Procedure: CATARACT EXTRACTION PHACO AND INTRAOCULAR LENS PLACEMENT ([place])- LEFT DIABETIC;  Surgeon: Marchia Meiers, MD;  Location: [facility] OR[facility];  Service: Ophthalmology;  Laterality: Left;  Lot # [id number] [place]: 00:46.3 CDE: 8.07   . CYST EXCISION     FOREHEAD  . FOOT SURGERY     CYST  . RIGHT/LEFT HEART CATH AND CORONARY ANGIOGRAPHY N/A 03/31/2020   Procedure: RIGHT/LEFT HEART CATH AND CORONARY ANGIOGRAPHY;  Surgeon: Wellington Hampshire, MD;  Location: [facility];  Service: Cardiovascular;  Laterality: N/A;  . TUBAL LIGATION      FAMILY HISTORY:  Family History  Problem Relation  Age of Onset  . Heart disease Mother   . Cancer Father     SOCIAL HISTORY:  Social History   Tobacco Use  . Smoking status: Current Every Day Smoker    Packs/day: 1.00    Years: 50.00    Pack years: 50.00    Types: Cigarettes  . Smokeless tobacco: Never Used  Substance Use Topics  . Alcohol use: Not Currently    ALLERGIES:  Allergies  Allergen Reactions  .  Altace [Ramipril] Swelling    MEDICATIONS:  Current Outpatient Medications  Medication Sig Dispense Refill  . albuterol (VENTOLIN HFA) 108 (90 Base) MCG/ACT inhaler Inhale 2 puffs into the lungs every 4 (four) hours as needed for wheezing or shortness of breath.     Marland Kitchen amiodarone (PACERONE) 200 MG tablet Take 1 tablet (200 mg total) by mouth daily. 90 tablet 3  . apixaban (ELIQUIS) 5 MG TABS tablet Take 1 tablet (5 mg total) by mouth 2 (two) times daily. 60 tablet 5  . atorvastatin (LIPITOR) 80 MG tablet Take 80 mg by mouth at bedtime.     Marland Kitchen esomeprazole (NEXIUM) 40 MG capsule Take 40 mg by mouth daily.    Marland Kitchen ezetimibe (ZETIA) 10 MG tablet Take 1 tablet (10 mg total) by mouth daily. 90 tablet 3  . famotidine (PEPCID) 40 MG tablet Take 40 mg by mouth daily.    . fenofibrate 160 MG tablet Take 160 mg by mouth daily.    . furosemide (LASIX) 20 MG tablet Take 1 tablet (20 mg total) by mouth daily. 90 tablet 3  . gabapentin (NEURONTIN) 600 MG tablet Take 600 mg by mouth 2 (two) times daily.     . hydrOXYzine (VISTARIL) 50 MG capsule Take 50 mg by mouth every 6 (six) hours as needed for anxiety.    . irbesartan (AVAPRO) 300 MG tablet Take 0.5 tablets (150 mg total) by mouth daily. 90 tablet 3  . levothyroxine (SYNTHROID, LEVOTHROID) 75 MCG tablet Take 75 mcg by mouth daily before breakfast.    . lidocaine (LIDODERM) 5 % SMARTSIG:Patch(s) Topical    . Melatonin 10 MG TABS Take 10 mg by mouth at bedtime as needed (sleep).    . metFORMIN (GLUCOPHAGE) 500 MG tablet Take 500 mg by mouth 2 (two) times daily.    Marland Kitchen oxyCODONE-acetaminophen (PERCOCET) 10-325 MG tablet Take 1 tablet by mouth 4 (four) times daily as needed for moderate pain or severe pain.    . potassium chloride SA (KLOR-CON) 20 MEQ tablet Take 1 tablet (20 mEq total) by mouth 2 (two) times daily. For one day. 2 tablet 0  . sitaGLIPtin (JANUVIA) 100 MG tablet Take 100 mg by mouth daily.    . Tiotropium Bromide Monohydrate 2.5 MCG/ACT AERS  Inhale 2 puffs into the lungs daily.      No current facility-administered medications for this encounter.    REVIEW OF SYSTEMS:  A 10+ POINT REVIEW OF SYSTEMS WAS OBTAINED including neurology, dermatology, psychiatry, cardiac, respiratory, lymph, extremities, GI, GU, musculoskeletal, constitutional, reproductive, HEENT.  The patient requires supplemental oxygen by nasal cannula 3 L 24 hours a day.  She ambulates minimally around the house with the assistance of a walker.  She reports decreased vision out of her right eye from macular degeneration.   PHYSICAL EXAM:  height is 5\' 2"  (1.575 m) and weight is 243 lb (110.2 kg). Her temporal temperature is 97.3 F (36.3 C) (abnormal). Her blood pressure is 154/51 (abnormal) and her pulse is 73. Her  respiration is 20 and oxygen saturation is 94%.   General: Alert and oriented, in no acute distress HEENT: Head is normocephalic. Extraocular movements are intact. Oropharynx is clear.  Edentulous Neck: Neck is supple, no palpable cervical or supraclavicular lymphadenopathy. Heart: Regular in rate and rhythm with no murmurs, rubs, or gallops. Chest: Clear to auscultation bilaterally, with some mild wheezing throughout both lungs.  Patient has bibasilar  crackles Abdomen: Soft, nontender, nondistended, with no rigidity or guarding. Extremities: No cyanosis or edema. Lymphatics: see Neck Exam Skin: No concerning lesions. Musculoskeletal: symmetric strength and muscle tone throughout. Neurologic: Cranial nerves II through XII are grossly intact. No obvious focalities. Speech is fluent. Coordination is intact. Psychiatric: Judgment and insight are intact. Affect is appropriate. Remains in a wheelchair for the entire evaluation.  ECOG = 2  0 - Asymptomatic (Fully active, able to carry on all predisease activities without restriction)  1 - Symptomatic but completely ambulatory (Restricted in physically strenuous activity but ambulatory and able to carry  out work of a light or sedentary nature. For example, light housework, office work)  2 - Symptomatic, <50% in bed during the day (Ambulatory and capable of all self care but unable to carry out any work activities. Up and about more than 50% of waking hours)  3 - Symptomatic, >50% in bed, but not bedbound (Capable of only limited self-care, confined to bed or chair 50% or more of waking hours)  4 - Bedbound (Completely disabled. Cannot carry on any self-care. Totally confined to bed or chair)  5 - Death   Eustace Pen MM, Creech RH, Tormey DC, et al. [phone]). "Toxicity and response criteria of the [facility]". [place] Oncol. 5 (6): 649-55  LABORATORY DATA:  Lab Results  Component Value Date   WBC 6.3 06/17/2020   HGB 12.1 06/17/2020   HCT 38.5 06/17/2020   MCV 93.9 06/17/2020   PLT 209 06/17/2020   NEUTROABS 7.5 07/04/2018   Lab Results  Component Value Date   NA 142 06/27/2020   K 4.3 06/27/2020   CL 96 (L) 06/27/2020   CO2 33 (H) 06/27/2020   GLUCOSE 125 (H) 06/27/2020   CREATININE 1.76 (H) 06/27/2020   CALCIUM 8.6 (L) 06/27/2020      RADIOGRAPHY: LONG TERM MONITOR-LIVE TELEMETRY (3-14 DAYS)  Result Date: 06/12/2020 Event monitor Normal sinus rhythm Patient had a min HR of 56 bpm, max HR of 229 bpm, and avg HR of 75 bpm. Atrial Fibrillation occurred (1% burden), ranging from 121-229 bpm (avg of 170 bpm), the longest lasting 1 hour 10 mins with an avg rate of 170 bpm. Atrial Fibrillation was detected within +/- 45 seconds of symptomatic patient event(s). 9 Supraventricular Tachycardia runs occurred, the run with the fastest interval lasting 4 beats with a max rate of 200 bpm, the longest lasting 9 beats with an avg rate of 96 bpm. Isolated SVEs were rare (<1.0%), SVE Couplets were rare (<1.0%), and SVE Triplets were rare (<1.0%). Isolated VEs were rare (<1.0%), VE Couplets were rare (<1.0%), and no VE Triplets were present. 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  We discussed that based on imaging this appears to be clinical stage I non-small cell lung cancer.  Potentially curable with surgery or  stereotactic body  radiation therapy (SBRT).  Given the early stage the patient would not require several weeks of treatment but anticipate 3 SBRT treatments.  I discussed with the patient that she would have  a good chance for cure since it was found very early.  We discussed potential referral to one of the thoracic lung surgeons for consultation and further evaluation but given the patient's multiple medical issues she does not wish to proceed with this consultation.    We discussed the natural history of lung cancer and general treatment, highlighting the role of radiotherapy in the management.  We discussed the available radiation techniques, and focused on the details of logistics and delivery.  We reviewed the anticipated acute and late sequelae associated with radiation in this setting.  The patient was encouraged to ask questions that I answered to the best of my ability.   A patient consent form was discussed and signed.  We retained a copy for our records.  The patient would like to proceed with radiation and will be scheduled for CT simulation.  PLAN: Patient will return on February 23rd at 52 for SBRT simulation with treatments to begin approximately a week later.  Anticipate 3 SBRT treatments  Total time spent in this encounter was 60 minutes which included reviewing the patient's most recent CTA of chest/abdomen/pelvis, PET scan, brain MRI, biopsy, pathology report, consultations, follow-ups, physical examination, and documentation.  ------------------------------------------------  Blair Promise, PhD, MD  This document serves as a  record of services personally performed by Gery Pray, MD. It was created on his behalf by Clerance Lav, a trained medical scribe. The creation of this record is based on the scribe's personal observations and the provider's statements to them. This document has been checked and approved by the attending provider.

## 2020-07-03 ENCOUNTER — Telehealth: Payer: Self-pay | Admitting: Cardiovascular Disease

## 2020-07-03 NOTE — Telephone Encounter (Signed)
Patient calling  Would like a medication clarification on some changes that were made recently Please call to discuss

## 2020-07-04 ENCOUNTER — Telehealth: Payer: Self-pay | Admitting: *Deleted

## 2020-07-04 ENCOUNTER — Other Ambulatory Visit
Admission: RE | Admit: 2020-07-04 | Discharge: 2020-07-04 | Disposition: A | Discharge status: Home / Self Care | Payer: 59 | Source: Ambulatory Visit | Attending: Family | Admitting: Family

## 2020-07-04 DIAGNOSIS — I5032 Chronic diastolic (congestive) heart failure: Secondary | ICD-10-CM | POA: Diagnosis present

## 2020-07-04 DIAGNOSIS — Z79899 Other long term (current) drug therapy: Secondary | ICD-10-CM

## 2020-07-04 LAB — BASIC METABOLIC PANEL
Anion gap: 9 (ref 5–15)
BUN: 24 mg/dL — ABNORMAL HIGH (ref 8–23)
CO2: 30 mmol/L (ref 22–32)
Calcium: 8.6 mg/dL — ABNORMAL LOW (ref 8.9–10.3)
Chloride: 99 mmol/L (ref 98–111)
Creatinine, Ser: 1.46 mg/dL — ABNORMAL HIGH (ref 0.44–1.00)
GFR, Estimated: 39 mL/min — ABNORMAL LOW (ref >60)
Glucose, Bld: 161 mg/dL — ABNORMAL HIGH (ref 70–99)
Potassium: 4.7 mmol/L (ref 3.5–5.1)
Sodium: 138 mmol/L (ref 135–145)

## 2020-07-04 NOTE — Telephone Encounter (Signed)
-----   Message from Loel Dubonnet, NP sent at 07/04/2020  1:48 PM EST ----- Kidney function improving though not back to her baseline.   Recommend changing Lasix to 20mg  every other day. Report any weight gain or change sin swelling. Continue Irbesartan 150mg  daily. Recommend she discuss her Metformin with PCP if they want her to continue at present dose as can also be harsh on kidneys. Recommend repeat BMP in 1-2 weeks for monitoring.

## 2020-07-04 NOTE — Telephone Encounter (Signed)
Spoke to pt notified of lab results and provider's recc.  Pt verbalized understanding.  She will continue Irbesartan 150mg  daily, report any wt gain or changes in swelling, discuss Metformin w/ PCP d/t decline in kidney fxn. She will have repeat Bmet in 1-2 weeks at the medical mall at [facility].  Lab orders placed. Pt has no further questions at this time.

## 2020-07-07 NOTE — Telephone Encounter (Signed)
Patient wants to re-review results with nurse  Please call to discuss

## 2020-07-07 NOTE — Telephone Encounter (Signed)
Spoke with the patient. Reviewed with the patient again her 07/04/20 lab results and  Laurann Montana, NP recommendation.  Kidney function improving though not back to her baseline.   Recommend changing Lasix to 20mg  every other day. Report any weight gain or change sin swelling. Continue Irbesartan 150mg  daily. Recommend she discuss her Metformin with PCP if they want her to continue at present dose as can also be harsh on kidneys. Recommend repeat BMP in 1-2 weeks for monitoring.     Patient verbalized understanding and has no additional questions at this time.

## 2020-07-09 ENCOUNTER — Ambulatory Visit
Admission: RE | Admit: 2020-07-09 | Discharge: 2020-07-09 | Disposition: A | Discharge status: Home / Self Care | Payer: 59 | Source: Ambulatory Visit | Attending: Radiation Oncology | Admitting: Radiation Oncology

## 2020-07-09 ENCOUNTER — Other Ambulatory Visit: Payer: Self-pay

## 2020-07-09 DIAGNOSIS — C3431 Malignant neoplasm of lower lobe, right bronchus or lung: Secondary | ICD-10-CM | POA: Diagnosis present

## 2020-07-09 DIAGNOSIS — C3491 Malignant neoplasm of unspecified part of right bronchus or lung: Secondary | ICD-10-CM

## 2020-07-17 DIAGNOSIS — C3491 Malignant neoplasm of unspecified part of right bronchus or lung: Secondary | ICD-10-CM | POA: Diagnosis present

## 2020-07-21 ENCOUNTER — Other Ambulatory Visit: Payer: Self-pay

## 2020-07-21 ENCOUNTER — Ambulatory Visit: Admission: RE | Admit: 2020-07-21 | Payer: 59 | Source: Ambulatory Visit | Admitting: Radiation Oncology

## 2020-07-21 ENCOUNTER — Ambulatory Visit
Admission: RE | Admit: 2020-07-21 | Discharge: 2020-07-21 | Disposition: A | Discharge status: Home / Self Care | Payer: 59 | Source: Ambulatory Visit | Attending: Radiation Oncology | Admitting: Radiation Oncology

## 2020-07-21 DIAGNOSIS — C3491 Malignant neoplasm of unspecified part of right bronchus or lung: Secondary | ICD-10-CM

## 2020-07-23 ENCOUNTER — Ambulatory Visit: Payer: 59 | Admitting: Radiation Oncology

## 2020-07-28 ENCOUNTER — Ambulatory Visit: Payer: 59 | Admitting: Radiation Oncology

## 2020-07-29 ENCOUNTER — Other Ambulatory Visit: Payer: Self-pay

## 2020-07-29 ENCOUNTER — Ambulatory Visit
Admission: RE | Admit: 2020-07-29 | Discharge: 2020-07-29 | Disposition: A | Discharge status: Home / Self Care | Payer: 59 | Source: Ambulatory Visit | Attending: Radiation Oncology | Admitting: Radiation Oncology

## 2020-07-29 DIAGNOSIS — C3491 Malignant neoplasm of unspecified part of right bronchus or lung: Secondary | ICD-10-CM

## 2020-07-30 ENCOUNTER — Ambulatory Visit: Payer: 59 | Admitting: Radiation Oncology

## 2020-07-31 ENCOUNTER — Ambulatory Visit
Admission: RE | Admit: 2020-07-31 | Discharge: 2020-07-31 | Disposition: A | Discharge status: Home / Self Care | Payer: 59 | Source: Ambulatory Visit | Attending: Radiation Oncology | Admitting: Radiation Oncology

## 2020-07-31 DIAGNOSIS — C3491 Malignant neoplasm of unspecified part of right bronchus or lung: Secondary | ICD-10-CM

## 2020-08-01 ENCOUNTER — Ambulatory Visit: Payer: 59 | Admitting: Radiation Oncology

## 2020-08-01 ENCOUNTER — Ambulatory Visit: Payer: 59 | Admitting: Physician Assistant

## 2020-08-04 ENCOUNTER — Other Ambulatory Visit: Payer: Self-pay

## 2020-08-04 ENCOUNTER — Ambulatory Visit
Admission: RE | Admit: 2020-08-04 | Discharge: 2020-08-04 | Disposition: A | Discharge status: Home / Self Care | Payer: 59 | Source: Ambulatory Visit | Attending: Radiation Oncology | Admitting: Radiation Oncology

## 2020-08-04 DIAGNOSIS — C3491 Malignant neoplasm of unspecified part of right bronchus or lung: Secondary | ICD-10-CM

## 2020-08-04 NOTE — Progress Notes (Deleted)
  You saw her 06/03/20 via telemedicine TAVR delayed due to lung cancer treatment  At last clinic visit her amiodarone was reduced to maintenance dose. Her BP and LDL remained elevated, Lasix was reduced, Losartan transitioned to Irbesartan, and Zetia started. Labs 06/27/20 with creatinine 1.76 and GFR 31. Her Irbesartan was reduced to 150mg  daily and Lasix/Metformin held for 2 days. Repeat lab work 07/04/20 with creatinine 1.46, GFR 39. She was recommended to resume Lasix at QOD, continue Irbesartan 150mg  QD, and discuss Metformin with her PCP. She was recommended for repeat BMP in 1 week, but she did not have this completed.   She met with Dr. Sondra Come of radiation oncology 07/02/20 due to invasive moderately differentiated squamos cell carcinoma of the RLL, stage 1. She declined referral to thoracic lung surgeon for consultation. Plan was made for SBRT simulation and 3 SBRT (radiation) treatments which she received on 07/29/20, 07/31/20, and 08/04/20.  Unclear what the plan is from a valve standpoint. May need to send a note to Dr. Angelena Form and Dr. Roxy Manns for clarity.   To address:  Recheck BMP  How is her BP? (allow some element of HTN in setting of severe AS?)  What is next follow up regarding valve? May just be sending [facility] a message.  Recurrent atrial fib?  How is HFpEF on Lasix QOD?

## 2020-08-05 ENCOUNTER — Ambulatory Visit: Payer: 59 | Admitting: Physician Assistant

## 2020-08-06 ENCOUNTER — Ambulatory Visit
Admission: RE | Admit: 2020-08-06 | Discharge: 2020-08-06 | Disposition: A | Discharge status: Home / Self Care | Payer: 59 | Source: Ambulatory Visit | Attending: Radiation Oncology | Admitting: Radiation Oncology

## 2020-08-06 ENCOUNTER — Other Ambulatory Visit: Payer: Self-pay

## 2020-08-06 DIAGNOSIS — C3491 Malignant neoplasm of unspecified part of right bronchus or lung: Secondary | ICD-10-CM | POA: Diagnosis not present

## 2020-08-08 ENCOUNTER — Encounter: Payer: Self-pay | Admitting: Radiation Oncology

## 2020-08-08 ENCOUNTER — Ambulatory Visit
Admission: RE | Admit: 2020-08-08 | Discharge: 2020-08-08 | Disposition: A | Discharge status: Home / Self Care | Payer: 59 | Source: Ambulatory Visit | Attending: Radiation Oncology | Admitting: Radiation Oncology

## 2020-08-08 ENCOUNTER — Other Ambulatory Visit: Payer: Self-pay

## 2020-08-08 DIAGNOSIS — C3491 Malignant neoplasm of unspecified part of right bronchus or lung: Secondary | ICD-10-CM | POA: Diagnosis not present

## 2020-08-15 ENCOUNTER — Other Ambulatory Visit: Payer: Self-pay

## 2020-08-15 ENCOUNTER — Other Ambulatory Visit
Admission: RE | Admit: 2020-08-15 | Discharge: 2020-08-15 | Disposition: A | Discharge status: Home / Self Care | Payer: 59 | Attending: Family | Admitting: Family

## 2020-08-15 ENCOUNTER — Ambulatory Visit (INDEPENDENT_AMBULATORY_CARE_PROVIDER_SITE_OTHER): Payer: 59 | Admitting: Physician Assistant

## 2020-08-15 ENCOUNTER — Encounter: Payer: Self-pay | Admitting: Physician Assistant

## 2020-08-15 VITALS — BP 124/60 | HR 72 | Ht 62.0 in | Wt 239.0 lb

## 2020-08-15 DIAGNOSIS — C3491 Malignant neoplasm of unspecified part of right bronchus or lung: Secondary | ICD-10-CM

## 2020-08-15 DIAGNOSIS — I480 Paroxysmal atrial fibrillation: Secondary | ICD-10-CM

## 2020-08-15 DIAGNOSIS — I5033 Acute on chronic diastolic (congestive) heart failure: Secondary | ICD-10-CM | POA: Diagnosis not present

## 2020-08-15 DIAGNOSIS — E039 Hypothyroidism, unspecified: Secondary | ICD-10-CM

## 2020-08-15 DIAGNOSIS — E785 Hyperlipidemia, unspecified: Secondary | ICD-10-CM

## 2020-08-15 DIAGNOSIS — I10 Essential (primary) hypertension: Secondary | ICD-10-CM | POA: Diagnosis not present

## 2020-08-15 DIAGNOSIS — Z720 Tobacco use: Secondary | ICD-10-CM

## 2020-08-15 DIAGNOSIS — I350 Nonrheumatic aortic (valve) stenosis: Secondary | ICD-10-CM

## 2020-08-15 DIAGNOSIS — Z79899 Other long term (current) drug therapy: Secondary | ICD-10-CM | POA: Insufficient documentation

## 2020-08-15 DIAGNOSIS — I2510 Atherosclerotic heart disease of native coronary artery without angina pectoris: Secondary | ICD-10-CM

## 2020-08-15 DIAGNOSIS — I5032 Chronic diastolic (congestive) heart failure: Secondary | ICD-10-CM

## 2020-08-15 LAB — BASIC METABOLIC PANEL
Anion gap: 7 (ref 5–15)
BUN: 17 mg/dL (ref 8–23)
CO2: 34 mmol/L — ABNORMAL HIGH (ref 22–32)
Calcium: 8.7 mg/dL — ABNORMAL LOW (ref 8.9–10.3)
Chloride: 99 mmol/L (ref 98–111)
Creatinine, Ser: 1.16 mg/dL — ABNORMAL HIGH (ref 0.44–1.00)
GFR, Estimated: 51 mL/min — ABNORMAL LOW (ref >60)
Glucose, Bld: 116 mg/dL — ABNORMAL HIGH (ref 70–99)
Potassium: 4.6 mmol/L (ref 3.5–5.1)
Sodium: 140 mmol/L (ref 135–145)

## 2020-08-15 MED ORDER — FUROSEMIDE 20 MG PO TABS: 20.0000 mg | ORAL_TABLET | Freq: Every day | ORAL | 3 refills | Status: DC

## 2020-08-15 MED ORDER — NICOTINE 21 MG/24HR TD PT24: 21.0000 mg | MEDICATED_PATCH | Freq: Every day | TRANSDERMAL | 5 refills | Status: AC

## 2020-08-15 NOTE — Progress Notes (Signed)
Office Visit    Patient Name: Kristin [name] Date of Encounter: 08/15/2020  PCP:  System, Provider Not In   [place]  Cardiologist:  Ida Rogue, MD  Advanced Practice Provider:  No care team member to display Electrophysiologist:  None [id number]   Chief Complaint    Chief Complaint  Patient presents with  . Follow-up    2 Months follow up. Per patient she is currently taking all the medications on the list provided to her at the beginning of her visit.     70 y.o. 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  Past Medical History    Past Medical History:  Diagnosis Date  . Anxiety   . Asthma   . CHF (congestive heart failure) ([place])    2018  . COPD (chronic obstructive pulmonary disease) ([place])   . Diabetes mellitus without complication ([place])   . Dyspnea   . GERD (gastroesophageal reflux disease)   . History of hiatal hernia   . History of orthopnea   . Hypothyroidism   . Neuropathy   . Oxygen deficiency    3L/HS  . Pain    BACK/DDD  . Peripheral vascular disease ([place])   . RLS (restless legs syndrome)   . Sleep apnea   . Wheezing    Past Surgical History:  Procedure Laterality Date  . BREAST SURGERY    . CATARACT EXTRACTION W/PHACO Right 03/02/2018   Procedure: CATARACT EXTRACTION PHACO AND INTRAOCULAR LENS PLACEMENT ([place]);  Surgeon: Marchia Meiers, MD;  Location: [facility] ORS;  Service: Ophthalmology;  Laterality: Right;  [place] 01:15 CDE 16.46 Fluid pack lot # [id number]  . CATARACT EXTRACTION W/PHACO Left 04/27/2018    Procedure: CATARACT EXTRACTION PHACO AND INTRAOCULAR LENS PLACEMENT ([place])- LEFT DIABETIC;  Surgeon: Marchia Meiers, MD;  Location: [facility] ORS;  Service: Ophthalmology;  Laterality: Left;  Lot # [id number] [place]: 00:46.3 CDE: 8.07   . CYST EXCISION     FOREHEAD  . FOOT SURGERY     CYST  . RIGHT/LEFT HEART CATH AND CORONARY ANGIOGRAPHY N/A 03/31/2020   Procedure: RIGHT/LEFT HEART CATH AND CORONARY ANGIOGRAPHY;  Surgeon: Wellington Hampshire, MD;  Location: [facility];  Service: Cardiovascular;  Laterality: N/A;  . TUBAL LIGATION      Allergies  Allergies  Allergen Reactions  . Altace [Ramipril] Swelling    History of Present Illness    Kristin [name] is a 70 y.o. female with PMH as above. She has a history of diastolic CHF after admission 07/04/2018 due to acute COPD and found to be volume up with successful IV diuresis. Seen 03/2020 in [facility] ED for NSTEMI. She reported increasing volume status and CP. Echo showed nl EF, mild LVH, G1DD, mild LAE, and moderate to severe AS. She underwent subsequent R/LHC. She was found to have moderate nonobstructive CAD with stenosis being 60% of pLAD. Cors were overall moderately calcified. Right heart cath showed mildly elevated L sided filling pressures, moderate PHTN, moderately reduced CO. Severe aortic stenosis noted with mean gradient 53mHg and calcified valve 0.7cm2.   Outpatient TAVR evaluation performed, as  well as evaluation by CTS for replacement of her aortic valve, though replacement has been complicated by recent RLL SCC diagnosis as detailed below. TAVR workup with Cardiac CTA noted a 1.9x1.4x1.6cm mass in super segmet of RLL suspicious for lung CA.   Subsequent PET scan showed 2.1x1.4cm RLL nodule that was hypermetabolic and compatible with malignancy with no appreciated nodal or mets.   She then underwent outpatient MRI of the brain to ensure no METs to the brain from her lung cancer. While in the MRI machine for her MRI brain scan,  she developed CP, abdominal pain, and emesis. She was transferred to the ED with cardiology consulted, She was noted to be in atrial flutter then converted to a junctional rhythm with 4.1 second termination pauses. Coreg was discontinued 2/2 termination pauses and Losartan dose increased for BP support. She was started on an oral load of amiodarone. In the setting of lung CA workup and questionable melanotic stools, [place] deferred with recommendation for outpatient monitor to determine burden of her arrhythmia.   On ambulatory monitoring (not yet uploaded as pt needs to mail the monitor back), recurrent Afib noted with the monitoring company subsequently calling the office. Per phone documentation 1/12, our office then  Recommended discontinuation of ASA and Eliquis 43m BID start. She was continued on amiodarone.   Lung bx then performed per CTS request, confirming a diagnosis of SCC of the right lower lobe. MRI of the brain was fortunately without intracranial mets. CTS recommendation was for workup/tx of SCC per oncology.   Seen via telemedicine 06/03/2020.  At that time, she was continuing work-up with oncology.  She reported improved energy.  No chest pain, shortness of breath, dyspnea, or signs or symptoms of volume overload.  No reported weight gain.  She was walking around the house without exertional symptoms.  No routine exercise program.  SBP usually 130s with lowest SBP 135.  DBP 60s to 70s.  Heart rate 60s to 90s.  She was still smoking but hoping to cut back.  She had decreased from 1.5 packs/day to 1 pack/day.  She denied alcohol.  She drinks some coffee and may go drinks.  She realized there was salt in the mango drinks, so was cutting back on them.  She was using Mrs. Dash.  Recommendations were to decrease to amiodarone 200 mg twice daily x7 days then drop down to 200 mg daily.  BP control was recommended with labs collected and then subsequent decision to transition from losartan to irbesartan  300 mg daily.  (See labs summary as directly below).  Message was sent to structural heart clinic regarding her valve with confirmation that further work-up of her valve would be deferred for now and while she followed up with oncology.    Subsequent 06/2020 labs showed LDL improved but still not at goal, hypokalemia, AKI, hyperglycemia.  Recommendations were for addition of Zetia 10 mg daily with repeat lipid and liver function in 6 to 8 weeks (collected early).  KCl tab 40 M EQ x1 was given.  Lasix decreased to 20 mg daily and sliding scale lasix recommended.  Losartan 50 mg discontinued and irbesartan 300 mg daily started.  Repeat BMET thereafter showed bump in kidney function with recommendation to decrease to irbesartan 150 mg daily, as well as hold Metformin & Lasix for 2 days.  Repeat BMET again with Cr still not at baseline with recommendation to decrease down to Lasix 20 mg every other day.  It was recommended she  discuss her Metformin with her PCP.  She reports discontinuing her Metformin shortly thereafter.  Most recent labs show improvement in renal function with review of all previous BMET labs showing likely new baseline creatinine of 1.1-1.2.  Between visits, a message was sent from Theodosia Quay, RN to update our office regarding the structural heart team plan.  She reported that at that time, the team felt she had moderate AS and should continue with observation.  Recommendation was to order echo at my upcoming office visit and then see Dr. Angelena Form in follow-up to discuss.  Today, 08/15/2020, she returns to clinic and notes that she feels as if she is holding onto volume.  She is currently using 5-6 pillows due to her orthopnea.  She reports increased breathing difficulty with transition to Lasix every other day.  She has continued on irbesartan 150 mg and amiodarone 200 mg daily.  She reports smoking 1 pack/day but still wants to reduce her use of tobacco.  She is monitoring her BP at home with  SBP 130/90.  She denies a regular exercise routine with her daughter reporting a new workout routine that involves walking in place, resistance bands, and weights.  She states that she will start this with her mother and slowly increase activity level.  Heart healthy diet reviewed, as well as sodium and fluid restrictions.  Weight today 108.4 kg with 05/26/2020 hospital weight 104.3 kg.  She is on oxygen during today's visit.  Home Medications    Current Outpatient Medications on File Prior to Visit  Medication Sig Dispense Refill  . albuterol (VENTOLIN HFA) 108 (90 Base) MCG/ACT inhaler Inhale 2 puffs into the lungs every 4 (four) hours as needed for wheezing or shortness of breath.     Marland Kitchen amiodarone (PACERONE) 200 MG tablet Take 1 tablet (200 mg total) by mouth daily. 90 tablet 3  . apixaban (ELIQUIS) 5 MG TABS tablet Take 1 tablet (5 mg total) by mouth 2 (two) times daily. 60 tablet 5  . atorvastatin (LIPITOR) 80 MG tablet Take 80 mg by mouth at bedtime.     . carvedilol (COREG) 3.125 MG tablet Take 3.125 mg by mouth 2 (two) times daily.    . citalopram (CELEXA) 40 MG tablet Take 40 mg by mouth daily.    . [employer] ASPIRIN ADULT LOW DOSE 81 MG chewable tablet Chew 81 mg by mouth daily.    Marland Kitchen docusate sodium (COLACE) 100 MG capsule Take 100 mg by mouth at bedtime as needed.    Marland Kitchen esomeprazole (NEXIUM) 40 MG capsule Take 40 mg by mouth daily.    Marland Kitchen ezetimibe (ZETIA) 10 MG tablet Take 1 tablet (10 mg total) by mouth daily. 90 tablet 3  . famotidine (PEPCID) 40 MG tablet Take 40 mg by mouth daily.    . fenofibrate 160 MG tablet Take 160 mg by mouth daily.    . ferrous sulfate 325 (65 FE) MG tablet Take 325 mg by mouth daily.    Marland Kitchen gabapentin (NEURONTIN) 600 MG tablet Take 600 mg by mouth 2 (two) times daily.     . hydrOXYzine (VISTARIL) 50 MG capsule Take 50 mg by mouth every 6 (six) hours as needed for anxiety.    Marland Kitchen ipratropium-albuterol (DUONEB) 0.5-2.5 (3) MG/3ML SOLN Take 3 mLs by nebulization 4 (four)  times daily.    . irbesartan (AVAPRO) 300 MG tablet Take 0.5 tablets (150 mg total) by mouth daily. 90 tablet 3  . levothyroxine (SYNTHROID, LEVOTHROID) 75 MCG tablet Take  75 mcg by mouth daily before breakfast.    . lidocaine (LIDODERM) 5 % SMARTSIG:Patch(s) Topical    . Melatonin 10 MG TABS Take 10 mg by mouth at bedtime as needed (sleep).    . metFORMIN (GLUCOPHAGE) 500 MG tablet Take 500 mg by mouth 2 (two) times daily.    Marland Kitchen MOVANTIK 25 MG TABS tablet Take 25 mg by mouth daily.    Marland Kitchen oxyCODONE-acetaminophen (PERCOCET) 10-325 MG tablet Take 1 tablet by mouth 4 (four) times daily as needed for moderate pain or severe pain.    . pantoprazole (PROTONIX) 40 MG tablet Take 40 mg by mouth 2 (two) times daily.    . potassium chloride SA (KLOR-CON) 20 MEQ tablet Take 1 tablet (20 mEq total) by mouth 2 (two) times daily. For one day. 2 tablet 0  . sitaGLIPtin (JANUVIA) 100 MG tablet Take 100 mg by mouth daily.    . Tiotropium Bromide Monohydrate 2.5 MCG/ACT AERS Inhale 2 puffs into the lungs daily.      No current facility-administered medications on file prior to visit.    Review of Systems    She reports shortness of breath, dyspnea, orthopnea with 5-6 pillows, abdominal distention, and weight gain. She denies chest pain, palpitations, n, v, dizziness, syncope, edema,  or early satiety. .   All other systems reviewed and are otherwise negative except as noted above.  Physical Exam    VS:  BP 124/60 (BP Location: Right Arm, Patient Position: Sitting, Cuff Size: Normal)   Pulse 72   Ht '5\' 2"'  (1.575 m)   Wt 239 lb (108.4 kg)   LMP  (LMP Unknown)   SpO2 95% Comment: 5 Liters of Oxygen  BMI 43.71 kg/m  , BMI Body mass index is 43.71 kg/m. GEN: No acute distress.  Joined today by her daughter.Marland Kitchen HEENT: normal.  Nasal cannula oxygen currently running at 5 L. Neck: Supple, no carotid bruits, or masses.  JVD difficult to assess due to body habitus and current nasal cannula oxygen. Cardiac: RRR,  2/6 systolic murmur (though cardiac exam limited by current oxygen machine /extra sounds coming from the oxygen machine).  No rubs or gallops. No clubbing, cyanosis.trace to mild nonpitting bilateral edema.  Radials/DP/PT 2+ and equal bilaterally.  Respiratory: Bilateral reduced breath sounds.  Pulmonary exam limited by oxygen machine.  As above, 5 L. GI: Soft, nontender, nondistended, BS + x 4. MS: no deformity or atrophy. Skin: warm and dry, no rash. Neuro:  Strength and sensation are intact. Psych: Normal affect.  Accessory Clinical Findings    ECG personally reviewed by me today - NSR, 72 bpm, poor R wave progression through precordial leads- no acute changes.  VITALS Reviewed today   Temp Readings from Last 3 Encounters:  07/02/20 (!) 97.3 F (36.3 C) (Temporal)  05/29/20 97.7 F (36.5 C) (Oral)  05/28/20 98.8 F (37.1 C) (Oral)   BP Readings from Last 3 Encounters:  08/15/20 124/60  07/02/20 (!) 154/51  06/03/20 (!) 165/79   Pulse Readings from Last 3 Encounters:  08/15/20 72  07/02/20 73  06/03/20 73    Wt Readings from Last 3 Encounters:  08/15/20 239 lb (108.4 kg)  07/02/20 243 lb (110.2 kg)  06/03/20 230 lb (104.3 kg)     LABS  reviewed today   Lab Results  Component Value Date   WBC 6.3 06/17/2020   HGB 12.1 06/17/2020   HCT 38.5 06/17/2020   MCV 93.9 06/17/2020   PLT 209 06/17/2020  Lab Results  Component Value Date   CREATININE 1.16 (H) 08/15/2020   BUN 17 08/15/2020   NA 140 08/15/2020   K 4.6 08/15/2020   CL 99 08/15/2020   CO2 34 (H) 08/15/2020   Lab Results  Component Value Date   ALT 18 06/27/2020   AST 18 06/27/2020   ALKPHOS 28 (L) 06/27/2020   BILITOT 0.3 06/27/2020   Lab Results  Component Value Date   CHOL 165 06/27/2020   HDL 61 06/27/2020   LDLCALC 91 06/27/2020   TRIG 64 06/27/2020   CHOLHDL 2.7 06/27/2020    Lab Results  Component Value Date   HGBA1C 6.0 (H) 03/28/2020   Lab Results  Component Value Date   TSH  1.133 05/26/2020     STUDIES/PROCEDURES reviewed today   05/29/20 biopsy done CXR IMPRESSION: 1. No pneumothorax after RIGHT lung biopsy. Expected post biopsy changes surrounding the nodule in the superior segment RIGHT LOWER LOBE. 2. Stable changes of chronic bronchitis and/or asthma.  MR brain 05/2020 without mets to brain  05/13/2020 NM PET  IMPRESSION: 1. 2.1 by 1.4 cm right lower lobe pulmonary nodule is hypermetabolic with maximum SUV of 5.4, compatible with malignancy. No appreciable nodal or metastatic spread. 2. Mildly accentuated activity along the inferior endplate of L2, probably incidental. 3. Hypodense 1.5 by 1.2 cm right inferior thyroid lobe nodule. Recommend thyroid US (ref: J Am Coll Radiol. 2015 Feb;12(2): 143-50). 4. Other imaging findings of potential clinical significance: Chronic right maxillary sinusitis. Aortic Atherosclerosis (ICD10-I70.0). Coronary atherosclerosis. Mild cardiomegaly. Mitral valve calcification. Diffuse hepatic steatosis. Cholelithiasis. Complex but photopenic left kidney upper pole cyst, most likely to be benign.  06/15/2019 CTA chest and aorta IMPRESSION: 1. Vascular findings and measurements pertinent to potential TAVR procedure, as detailed above. 2. Severe thickening calcification of the aortic valve, compatible with reported clinical history of severe aortic stenosis. 3. 1.9 x 1.4 x 1.6 cm macrolobulated nodule in the superior segment of the right lower lobe, highly concerning for primary bronchogenic neoplasm. Further evaluation with PET-CT is recommended in the near future to better evaluate this finding and assess for potential metastatic disease. No definite metastatic disease is identified in the chest, abdomen or pelvis on today's examination. 4. Aortic atherosclerosis, in addition to left main and 3 vessel coronary artery disease. 5. Severe calcifications of the mitral annulus. 6. Severe hepatic steatosis. 7.  Colonic diverticulosis without evidence of acute diverticulitis at this time. 8. Additional incidental findings, as above. These results will be called to the ordering clinician or representative by the Radiologist Assistant, and communication documented in the PACS or [employer].  CTA abdomen and pelvis 04/14/2020 IMPRESSION: 1. Vascular findings and measurements pertinent to potential TAVR procedure, as detailed above. 2. Severe thickening calcification of the aortic valve, compatible with reported clinical history of severe aortic stenosis. 3. 1.9 x 1.4 x 1.6 cm macrolobulated nodule in the superior segment of the right lower lobe, highly concerning for primary bronchogenic neoplasm. Further evaluation with PET-CT is recommended in the near future to better evaluate this finding and assess for potential metastatic disease. No definite metastatic disease is identified in the chest, abdomen or pelvis on today's examination. 4. Aortic atherosclerosis, in addition to left main and 3 vessel coronary artery disease. 5. Severe calcifications of the mitral annulus. 6. Severe hepatic steatosis. 7. Colonic diverticulosis without evidence of acute diverticulitis at this time. 8. Additional incidental findings, as above. These results will be called to the ordering clinician or representative by  the Psychologist, clinical, and communication documented in the PACS or [employer].  Cardiac CTA 04/14/2020 IMPRESSION: 1. Trileaflet aortic valve with moderately calcified leaflets and moderately restricted leaflet opening. Aortic valve calcium score 1257. (Severe if > 2000 in males). Annular measurements suitable for delivery of a 26 mm Edwards-SAPIEN 3 Ultra valve. 2. Sufficient coronary to annulus distance. 3. Optimum Fluoroscopic Angle for Delivery: RAO 2 CRA 3. 4. No thrombus in the left atrial appendage.   US carotid 04/01/2020 IMPRESSION: Color duplex indicates  minimal heterogeneous and calcified plaque, with no hemodynamically significant stenosis by duplex criteria in the extracranial cerebrovascular circulation.  R/LHC 03/31/2020  Prox RCA lesion is 30% stenosed.  Mid RCA lesion is 40% stenosed.  RPDA lesion is 30% stenosed.  Prox Cx lesion is 60% stenosed.  Mid LAD lesion is 20% stenosed.  Mid Cx to Dist Cx lesion is 20% stenosed. 1. Mild to moderate nonobstructive coronary artery disease. Worst stenosis is 60% in the proximal left circumflex. No evidence of obstructive disease. Coronary arteries are overall moderately calcified. 2. Right heart catheterization showed moderately elevated left-sided filling pressures, moderate pulmonary hypertension and moderately reduced cardiac output. 3. Severe aortic stenosis with mean gradient of 31 mmHg and calculated valve area of 0.7 cm. Recommendations: The patient is significantly volume overloaded. I switched furosemide to intravenous 20 mg twice daily. I increased carvedilol for better blood pressure control. Recommend medical therapy for nonobstructive coronary artery disease. Recommend outpatient TAVR evaluation.  Echo 03/29/2020 1. Left ventricular ejection fraction, by estimation, is 55 to 60%. The  left ventricle has normal function. The left ventricle has no regional  wall motion abnormalities. There is mild left ventricular hypertrophy.  Left ventricular diastolic parameters  are consistent with Grade I diastolic dysfunction (impaired relaxation).  2. Right ventricular systolic function is normal. The right ventricular  size is normal. Tricuspid regurgitation signal is inadequate for assessing  PA pressure.  3. Left atrial size was mildly dilated.  4. The aortic valve was not well visualized. Moderate to severe aortic  valve stenosis. Aortic valve area, by VTI measures 1.11 cm. Aortic valve  mean gradient measures 31.0 mmHg. Aortic valve Vmax measures 3.66 m/s.    Assessment & Plan    Paroxysmal Atrial fibrillation / flutter --NSR. Paroxysmal atrial flutter, junctional rhythm, and 4.1 second termination pause all noted at previous admission.  Ambulatory cardiac monitoring 05/2020 with 1% burden Afib and pt triggered event indicating symptomatic. Longest episode 1h 49m 9 episodes SVT.  ? Medications:  Amiodarone - continue 2033mdaily. Ongoing monitoring - periodic LFTs, TSH, PFTs, CXR.   No BB 2/2 post termination pauses. Rate in NSR well controlled.   Eliquis - continue at Eliquis 5 mg BID. Does not qualify for reduced dosing. No s/sx of bleeding. CHA2DS2VASc score of at least  4 (age, DM2, vascular, female).  Moderate nonobstructive coronary artery disease by 03/31/2020 catheterization History of NSTEMI --No recentCP. Echo 03/2020 with EF 55-60%, no wall motion abnl, mild LVH, G1DD, AS. 03/2020 R/LHC showed cors moderately calcified.  Recommend continued medical management and aggressive risk factor modification. Continue Eliquis in lieu of ASA. No BB in the setting of post termination pauses. Continue ARB, as needed sublingual nitro as needed for chest pain. Continue PTA atorvastatin 8038maily with LDL goal below 70. Recommend smoking cessation.  Moderate to Severe aortic stenosis --S/p R/LHC with AS mean gradient of 31 mmHg and calculated valve area of 0.7 cm. Cardiac CTA performed, as well as carotids, and  as copied and pasted above. OP workup has been complicated by SCC of RLL (see bx copied above).  She has been seen as OP by both the TAVR team and CTS with consideration of current oncology workup.  Her most recent communication with the structural heart team, we will obtain a repeat echo for May 2022 and schedule for follow-up with Dr. Angelena Form after that time.  AOC HFpEF --Reports progressive symptoms of volume overload. Continues on 5L [place] O2 at baseline. RHC - moderately elevated LVEDP, moderate PHTN.   Volume status somewhat  difficult to ascertain given body habitus, though based on her symptoms and recent reduction in Lasix over the last couple of months, suspect at least mild volume overload.  Will increase back to Lasix 20 mg daily with repeat c-Met in 2 weeks.  Reviewed CHF education and recommendation for lifestyle changes. Ongoing daily wt, BP log recommended at home.  HTN ---BP well controlled with goal BP 130/80 or lower.  ? Continue irbesartan 150 mg daily.  ? Increase back to Lasix 47m with repeat c-Met in 2 weeks. ? Reviewed salt and fluid limitations.  ? Continue to monitor BP and weights at home.  SCC of RLL  --Likely contributing to her breathing status.  She has been completing SBRT treatment.  Continue 5 L nasal cannula oxygen.  Continue follow-up as directed per oncology.  HLD --Continue current atorvastatin 866mdaily.  Repeat lipid and liver check at time of BMET in 2 weeks.   DM2 --Glycemic control recommended A1C 6.0. Recommend ongoing follow-up with PCP as OP.  Hypothyroidism --Most recent TSH wnl. Management per PCP.  Tobacco use/COPD --Smoking cessation recommended. Patches supplied. She wants to quit and patches worked in the past.  Medication changes:  --Increase back to Lasix 20 mg daily. --Nicotine patches Labs ordered: CMET,  lipids Studies / Imaging ordered:  May echo for structural heart team. Instructions for structural heart visit supplied. Future considerations:  Structural heart team follow-up after echo.  Disposition:  RTC after structural heart team echo and follow-up (approximately June to July)  JaArvil ChacoPA-C 08/15/2020

## 2020-08-15 NOTE — Patient Instructions (Addendum)
Medication Instructions:  Your physician has recommended you make the following change in your medication:   CHANGE taking Lasix 20mg  DAILY  START Nicotine patch daily   *If you need a refill on your cardiac medications before your next appointment, please call your pharmacy*   Lab Work:  Your physician recommends that you return for FASTING lab work to the medical mall at [facility] in: 2 weeks (CMET, Lipid panel) -  [facility] You will check in at the front desk to the right as you walk into the atrium. Valet Parking is offered if needed. - No appointment needed. You may go any day between 7 am and 6 pm.   Testing/Procedures:  Your physician has requested that you have an echocardiogram in May. Echocardiography is a painless test that uses sound waves to create images of your heart. It provides your doctor with information about the size and shape of your heart and how well your heart's chambers and valves are working. This procedure takes approximately one hour. There are no restrictions for this procedure.    Follow-Up: At [facility], you and your health needs are our priority.  As part of our continuing mission to provide you with exceptional heart care, we have created designated Provider Care Teams.  These Care Teams include your primary Cardiologist (physician) and Advanced Practice Providers (APPs -  Physician Assistants and Nurse Practitioners) who all work together to provide you with the care you need, when you need it.  We recommend signing up for the patient portal called "MyChart".  Sign up information is provided on this After Visit Summary.  MyChart is used to connect with patients for Virtual Visits (Telemedicine).  Patients are able to view lab/test results, encounter notes, upcoming appointments, etc.  Non-urgent messages can be sent to your provider as well.   To learn more about what you can do with MyChart, go to [email or web address].     Your next appointment:    Follow up with Dr. Julianne Handler AFTER Echocardiogram  Follow up in our office in June 2022  The format for your next appointment:   In Person  Provider:   You may see Ida Rogue, MD or one of the following Advanced Practice Providers on your designated Care Team:    Murray Hodgkins, NP  Christell Faith, PA-C  Marrianne Mood, PA-C  Cadence Kathlen Mody, [place]  Laurann Montana, NP    Other Instructions  Dr. Julianne Handler [facility]  [place], [place] [address]  [phone]    Heart-Healthy Eating Plan Heart-healthy meal planning includes:  Eating less unhealthy fats.  Eating more healthy fats.  Making other changes in your diet. Talk with your doctor or a diet specialist (dietitian) to create an eating plan that is right for you. What is my plan? Your doctor may recommend an eating plan that includes:  Total fat: ______% or less of total calories a day.  Saturated fat: ______% or less of total calories a day.  Cholesterol: less than _________mg a day. What are tips for following this plan? Cooking Avoid frying your food. Try to bake, boil, grill, or broil it instead. You can also reduce fat by:  Removing the skin from poultry.  Removing all visible fats from meats.  Steaming vegetables in water or broth. Meal planning  At meals, divide your plate into four equal parts: ? Fill one-half of your plate with vegetables and green salads. ? Fill one-fourth of  your plate with whole grains. ? Fill one-fourth of your plate with lean protein foods.  Eat 4-5 servings of vegetables per day. A serving of vegetables is: ? 1 cup of raw or cooked vegetables. ? 2 cups of raw leafy greens.  Eat 4-5 servings of fruit per day. A serving of fruit is: ? 1 medium whole fruit. ?  cup of dried fruit. ?  cup of fresh, frozen, or canned fruit. ?  cup of 100% fruit juice.  Eat more foods that have soluble fiber. These are  apples, broccoli, carrots, beans, peas, and barley. Try to get 20-30 g of fiber per day.  Eat 4-5 servings of nuts, legumes, and seeds per week: ? 1 serving of dried beans or legumes equals  cup after being cooked. ? 1 serving of nuts is  cup. ? 1 serving of seeds equals 1 tablespoon.   General information  Eat more home-cooked food. Eat less restaurant, buffet, and fast food.  Limit or avoid alcohol.  Limit foods that are high in starch and sugar.  Avoid fried foods.  Lose weight if you are overweight.  Keep track of how much salt (sodium) you eat. This is important if you have high blood pressure. Ask your doctor to tell you more about this.  Try to add vegetarian meals each week. Fats  Choose healthy fats. These include olive oil and canola oil, flaxseeds, walnuts, almonds, and seeds.  Eat more omega-3 fats. These include salmon, mackerel, sardines, tuna, flaxseed oil, and ground flaxseeds. Try to eat fish at least 2 times each week.  Check food labels. Avoid foods with trans fats or high amounts of saturated fat.  Limit saturated fats. ? These are often found in animal products, such as meats, butter, and cream. ? These are also found in plant foods, such as palm oil, palm kernel oil, and coconut oil.  Avoid foods with partially hydrogenated oils in them. These have trans fats. Examples are stick margarine, some tub margarines, cookies, crackers, and other baked goods. What foods can I eat? Fruits All fresh, canned (in natural juice), or frozen fruits. Vegetables Fresh or frozen vegetables (raw, steamed, roasted, or grilled). Green salads. Grains Most grains. Choose whole wheat and whole grains most of the time. Rice and pasta, including brown rice and pastas made with whole wheat. Meats and other proteins Lean, well-trimmed beef, veal, pork, and lamb. Chicken and [place] without skin. All fish and shellfish. Wild duck, rabbit, pheasant, and venison. Egg whites or  low-cholesterol egg substitutes. Dried beans, peas, lentils, and tofu. Seeds and most nuts. Dairy Low-fat or nonfat cheeses, including ricotta and mozzarella. Skim or 1% milk that is liquid, powdered, or evaporated. Buttermilk that is made with low-fat milk. Nonfat or low-fat yogurt. Fats and oils Non-hydrogenated (trans-free) margarines. Vegetable oils, including soybean, sesame, sunflower, olive, peanut, safflower, corn, canola, and cottonseed. Salad dressings or mayonnaise made with a vegetable oil. Beverages Mineral water. Coffee and tea. Diet carbonated beverages. Sweets and desserts Sherbet, gelatin, and fruit ice. Small amounts of dark chocolate. Limit all sweets and desserts. Seasonings and condiments All seasonings and condiments. The items listed above may not be a complete list of foods and drinks you can eat. Contact a dietitian for more options. What foods should I avoid? Fruits Canned fruit in heavy syrup. Fruit in cream or butter sauce. Fried fruit. Limit coconut. Vegetables Vegetables cooked in cheese, cream, or butter sauce. Fried vegetables. Grains Breads that are made with saturated or trans  fats, oils, or whole milk. Croissants. Sweet rolls. Donuts. High-fat crackers, such as cheese crackers. Meats and other proteins Fatty meats, such as hot dogs, ribs, sausage, bacon, rib-eye roast or steak. High-fat deli meats, such as salami and bologna. Caviar. Domestic duck and goose. Organ meats, such as liver. Dairy Cream, sour cream, cream cheese, and creamed cottage cheese. Whole-milk cheeses. Whole or 2% milk that is liquid, evaporated, or condensed. Whole buttermilk. Cream sauce or high-fat cheese sauce. Yogurt that is made from whole milk. Fats and oils Meat fat, or shortening. Cocoa butter, hydrogenated oils, palm oil, coconut oil, palm kernel oil. Solid fats and shortenings, including bacon fat, salt pork, lard, and butter. Nondairy cream substitutes. Salad dressings with  cheese or sour cream. Beverages Regular sodas and juice drinks with added sugar. Sweets and desserts Frosting. Pudding. Cookies. Cakes. Pies. Milk chocolate or white chocolate. Buttered syrups. Full-fat ice cream or ice cream drinks. The items listed above may not be a complete list of foods and drinks to avoid. Contact a dietitian for more information. Summary  Heart-healthy meal planning includes eating less unhealthy fats, eating more healthy fats, and making other changes in your diet.  Eat a balanced diet. This includes fruits and vegetables, low-fat or nonfat dairy, lean protein, nuts and legumes, whole grains, and heart-healthy oils and fats. This information is not intended to replace advice given to you by your health care provider. Make sure you discuss any questions you have with your health care provider. Document Revised: 07/07/2017 Document Reviewed: 06/10/2017 Elsevier Patient Education  2021 [employer].

## 2020-09-08 ENCOUNTER — Encounter: Payer: Self-pay | Admitting: Radiation Oncology

## 2020-09-12 ENCOUNTER — Emergency Department
Admission: EM | Admit: 2020-09-12 | Discharge: 2020-09-12 | Disposition: A | Discharge status: Home / Self Care | Payer: 59 | Attending: Emergency Medicine | Admitting: Emergency Medicine

## 2020-09-12 ENCOUNTER — Emergency Department: Payer: 59

## 2020-09-12 ENCOUNTER — Other Ambulatory Visit: Payer: Self-pay

## 2020-09-12 DIAGNOSIS — W109XXA Fall (on) (from) unspecified stairs and steps, initial encounter: Secondary | ICD-10-CM | POA: Diagnosis not present

## 2020-09-12 DIAGNOSIS — S7001XA Contusion of right hip, initial encounter: Secondary | ICD-10-CM

## 2020-09-12 DIAGNOSIS — E1140 Type 2 diabetes mellitus with diabetic neuropathy, unspecified: Secondary | ICD-10-CM | POA: Diagnosis not present

## 2020-09-12 DIAGNOSIS — F17210 Nicotine dependence, cigarettes, uncomplicated: Secondary | ICD-10-CM | POA: Insufficient documentation

## 2020-09-12 DIAGNOSIS — S42291A Other displaced fracture of upper end of right humerus, initial encounter for closed fracture: Secondary | ICD-10-CM

## 2020-09-12 DIAGNOSIS — S42201A Unspecified fracture of upper end of right humerus, initial encounter for closed fracture: Secondary | ICD-10-CM | POA: Diagnosis not present

## 2020-09-12 DIAGNOSIS — J449 Chronic obstructive pulmonary disease, unspecified: Secondary | ICD-10-CM | POA: Diagnosis not present

## 2020-09-12 DIAGNOSIS — E039 Hypothyroidism, unspecified: Secondary | ICD-10-CM | POA: Insufficient documentation

## 2020-09-12 DIAGNOSIS — I5032 Chronic diastolic (congestive) heart failure: Secondary | ICD-10-CM | POA: Insufficient documentation

## 2020-09-12 DIAGNOSIS — I110 Hypertensive heart disease with heart failure: Secondary | ICD-10-CM | POA: Insufficient documentation

## 2020-09-12 DIAGNOSIS — Z79899 Other long term (current) drug therapy: Secondary | ICD-10-CM | POA: Insufficient documentation

## 2020-09-12 DIAGNOSIS — Y92009 Unspecified place in unspecified non-institutional (private) residence as the place of occurrence of the external cause: Secondary | ICD-10-CM | POA: Insufficient documentation

## 2020-09-12 DIAGNOSIS — Z7901 Long term (current) use of anticoagulants: Secondary | ICD-10-CM | POA: Insufficient documentation

## 2020-09-12 DIAGNOSIS — J45909 Unspecified asthma, uncomplicated: Secondary | ICD-10-CM | POA: Diagnosis not present

## 2020-09-12 DIAGNOSIS — Y9389 Activity, other specified: Secondary | ICD-10-CM | POA: Insufficient documentation

## 2020-09-12 DIAGNOSIS — Z7984 Long term (current) use of oral hypoglycemic drugs: Secondary | ICD-10-CM | POA: Insufficient documentation

## 2020-09-12 DIAGNOSIS — S4991XA Unspecified injury of right shoulder and upper arm, initial encounter: Secondary | ICD-10-CM | POA: Diagnosis present

## 2020-09-12 DIAGNOSIS — W19XXXA Unspecified fall, initial encounter: Secondary | ICD-10-CM

## 2020-09-12 MED ORDER — OXYCODONE-ACETAMINOPHEN 5-325 MG PO TABS
1.0000 | ORAL_TABLET | ORAL | Status: DC | PRN
  Administered 2020-09-12: 1 via ORAL
  Filled 2020-09-12: qty 1

## 2020-09-12 MED ORDER — OXYCODONE-ACETAMINOPHEN 5-325 MG PO TABS: 1.0000 | ORAL_TABLET | Freq: Four times a day (QID) | ORAL | 0 refills | Status: DC | PRN

## 2020-09-12 NOTE — ED Provider Notes (Signed)
[facility] Emergency Department Provider Note  ____________________________________________   Event Date/Time   First MD Initiated Contact with Patient 09/12/20 1056     (approximate)  I have reviewed the triage vital signs and the nursing notes.   HISTORY  Chief Complaint Fall   HPI Kristin [name] is a 70 y.o. female presents to the ED with complaint of right shoulder pain and right hip pain.  Patient states that at 3 AM this morning she was taking her dog out side.  She states that she fell but did not hit her head and there was no loss of consciousness.  She had her son-in-law help her back into the home where she is continue to have pain.  She rates her pain as 10/10.     Past Medical History:  Diagnosis Date  . Anxiety   . Asthma   . CHF (congestive heart failure) ([place])    2018  . COPD (chronic obstructive pulmonary disease) ([place])   . Diabetes mellitus without complication ([place])   . Dyspnea   . GERD (gastroesophageal reflux disease)   . History of hiatal hernia   . History of orthopnea   . History of radiation therapy 07/29/20-08/08/20   Right Lung, SBRT Dr. Gery Pray  . Hypothyroidism   . Neuropathy   . Oxygen deficiency    3L/HS  . Pain    BACK/DDD  . Peripheral vascular disease ([place])   . RLS (restless legs syndrome)   . Sleep apnea   . Wheezing     Patient Active Problem List   Diagnosis Date Noted  . Lung cancer ([place]) 05/26/2020  . Atrial flutter with rapid ventricular response ([place]) 05/26/2020  . Demand ischemia ([place])   . Acute on chronic heart failure with preserved ejection fraction (HFpEF) ([place])   . Aortic stenosis   . Obesity, Class III, BMI 40-49.9 (morbid obesity) ([place]) 03/29/2020  . Chronic respiratory failure with hypoxia ([place]) 03/29/2020  . OSA (obstructive sleep apnea) 03/29/2020  . Essential hypertension 03/29/2020  . Hypothyroidism 03/29/2020  . Chest pain 03/29/2020  . Elevated troponin 03/29/2020  .  Chronic diastolic (congestive) heart failure ([place]) 07/12/2018  . Type 2 diabetes mellitus without complication ([place]) 36/46/8032  . COPD (chronic obstructive pulmonary disease) ([place]) 07/12/2018  . Tobacco use 07/12/2018  . Hypotension 07/12/2018  . Respiratory failure ([place]) 07/04/2018    Past Surgical History:  Procedure Laterality Date  . BREAST SURGERY    . CATARACT EXTRACTION W/PHACO Right 03/02/2018   Procedure: CATARACT EXTRACTION PHACO AND INTRAOCULAR LENS PLACEMENT ([place]);  Surgeon: Marchia Meiers, MD;  Location: [facility] ORS;  Service: Ophthalmology;  Laterality: Right;  [place] 01:15 CDE 16.46 Fluid pack lot # [id number]  . CATARACT EXTRACTION W/PHACO Left 04/27/2018   Procedure: CATARACT EXTRACTION PHACO AND INTRAOCULAR LENS PLACEMENT ([place])- LEFT DIABETIC;  Surgeon: Marchia Meiers, MD;  Location: [facility] ORS;  Service: Ophthalmology;  Laterality: Left;  Lot # [id number] H [place]: 00:46.3 CDE: 8.07   . CYST EXCISION     FOREHEAD  . FOOT SURGERY     CYST  . RIGHT/LEFT HEART CATH AND CORONARY ANGIOGRAPHY N/A 03/31/2020   Procedure: RIGHT/LEFT HEART CATH AND CORONARY ANGIOGRAPHY;  Surgeon: Wellington Hampshire, MD;  Location: [facility];  Service: Cardiovascular;  Laterality: N/A;  . TUBAL LIGATION      Prior to Admission medications   Medication Sig Start Date End Date Taking? Authorizing Provider  oxyCODONE-acetaminophen (PERCOCET) 5-325 MG tablet Take 1 tablet by  mouth every 6 (six) hours as needed for severe pain. 09/12/20  Yes Johnn Hai, PA-C  albuterol (VENTOLIN HFA) 108 (90 Base) MCG/ACT inhaler Inhale 2 puffs into the lungs every 4 (four) hours as needed for wheezing or shortness of breath.     [provider]  amiodarone (PACERONE) 200 MG tablet Take 1 tablet (200 mg total) by mouth daily. 06/13/20   Dunn, Areta Haber, PA-C  apixaban (ELIQUIS) 5 MG TABS tablet Take 1 tablet (5 mg total) by mouth 2 (two) times daily. 05/29/20   Dunn, Areta Haber, PA-C  atorvastatin (LIPITOR) 80 MG  tablet Take 80 mg by mouth at bedtime.  09/05/19   [provider]  carvedilol (COREG) 3.125 MG tablet Take 3.125 mg by mouth 2 (two) times daily. 06/14/20   [provider]  citalopram (CELEXA) 40 MG tablet Take 40 mg by mouth daily. 06/14/20   [provider]  [facility] ASPIRIN ADULT LOW DOSE 81 MG chewable tablet Chew 81 mg by mouth daily. 03/19/20   [provider]  docusate sodium (COLACE) 100 MG capsule Take 100 mg by mouth at bedtime as needed. 03/19/20   [provider]  esomeprazole (NEXIUM) 40 MG capsule Take 40 mg by mouth daily. 10/01/19   [provider]  ezetimibe (ZETIA) 10 MG tablet Take 1 tablet (10 mg total) by mouth daily. 06/18/20 06/13/21  Marrianne Mood D, PA-C  famotidine (PEPCID) 40 MG tablet Take 40 mg by mouth daily.    [provider]  fenofibrate 160 MG tablet Take 160 mg by mouth daily.    [provider]  ferrous sulfate 325 (65 FE) MG tablet Take 325 mg by mouth daily. 06/14/20   [provider]  furosemide (LASIX) 20 MG tablet Take 1 tablet (20 mg total) by mouth daily. 08/15/20 08/10/21  Marrianne Mood D, PA-C  gabapentin (NEURONTIN) 600 MG tablet Take 600 mg by mouth 2 (two) times daily.  11/20/19   [provider]  hydrOXYzine (VISTARIL) 50 MG capsule Take 50 mg by mouth every 6 (six) hours as needed for anxiety. 03/14/20   [provider]  ipratropium-albuterol (DUONEB) 0.5-2.5 (3) MG/3ML SOLN Take 3 mLs by nebulization 4 (four) times daily. 08/07/20   [provider]  irbesartan (AVAPRO) 300 MG tablet Take 0.5 tablets (150 mg total) by mouth daily. 06/30/20 06/25/21  Loel Dubonnet, NP  levothyroxine (SYNTHROID, LEVOTHROID) 75 MCG tablet Take 75 mcg by mouth daily before breakfast.    [provider]  lidocaine (LIDODERM) 5 % SMARTSIG:Patch(s) Topical 05/28/20   [provider]  Melatonin 10 MG TABS Take 10 mg by mouth at bedtime as needed (sleep).     [provider]  metFORMIN (GLUCOPHAGE) 500 MG tablet Take 500 mg by mouth 2 (two) times daily. 09/19/19   [provider]  MOVANTIK 25 MG TABS tablet Take 25 mg by mouth daily. 07/21/20   [provider]  nicotine (NICODERM CQ - DOSED IN MG/24 HOURS) 21 mg/24hr patch Place 1 patch (21 mg total) onto the skin daily. 08/15/20   Marrianne Mood D, PA-C  pantoprazole (PROTONIX) 40 MG tablet Take 40 mg by mouth 2 (two) times daily. 08/13/20   [provider]  potassium chloride SA (KLOR-CON) 20 MEQ tablet Take 1 tablet (20 mEq total) by mouth 2 (two) times daily. For one day. 06/18/20   Marrianne Mood D, PA-C  sitaGLIPtin (JANUVIA) 100 MG tablet Take 100 mg by mouth daily.  [provider]  Tiotropium Bromide Monohydrate 2.5 MCG/ACT AERS Inhale 2 puffs into the lungs daily.     [provider]    Allergies Altace [ramipril]  Family History  Problem Relation Age of Onset  . Heart disease Mother   . Cancer Father     Social History Social History   Tobacco Use  . Smoking status: Current Every Day Smoker    Packs/day: 1.00    Years: 50.00    Pack years: 50.00    Types: Cigarettes  . Smokeless tobacco: Never Used  Vaping Use  . Vaping Use: Never used  Substance Use Topics  . Alcohol use: Not Currently    Review of Systems Constitutional: No fever/chills Eyes: No visual changes. ENT: No trauma. Cardiovascular: Denies chest pain. Respiratory: Denies shortness of breath. Gastrointestinal: No abdominal pain.  No nausea, no vomiting.  Genitourinary: Negative for dysuria. Musculoskeletal: Positive right shoulder pain and right hip pain. Skin: Negative for rash. Neurological: Negative for headaches, focal weakness or numbness. ____________________________________________   PHYSICAL EXAM:  VITAL SIGNS: ED Triage Vitals  Enc Vitals Group     BP 09/12/20 0926 (!) 165/65     Pulse Rate 09/12/20 0926 78     Resp 09/12/20 0926 18      Temp 09/12/20 0926 98.2 F (36.8 C)     Temp Source 09/12/20 0926 Oral     SpO2 09/12/20 0926 99 %     Weight 09/12/20 0928 230 lb (104.3 kg)     Height 09/12/20 0928 5\' 2"  (1.575 m)     Head Circumference --      Peak Flow --      Pain Score 09/12/20 0928 10     Pain Loc --      Pain Edu? --      Excl. in [place]? --    Constitutional: Alert and oriented. Well appearing and in no acute distress.  Patient is talkative and cooperative.  She is currently on continuous O2 with a nasal cannula for asthma, COPD, congestive heart failure, and oxygen deficiency per her history. Eyes: Conjunctivae are normal. PERRL. EOMI. Head: Atraumatic. Nose: No congestion/rhinnorhea. Mouth/Throat: No trauma.   Neck: No stridor.  Nontender cervical spine to palpation posteriorly.  No abrasions or skin discoloration noted. Cardiovascular: Normal rate, regular rhythm. Grossly normal heart sounds.  Good peripheral circulation. Respiratory: Normal respiratory effort.  No retractions. Lungs CTAB. Gastrointestinal: Soft and nontender. No distention.  Bowel sounds normoactive x4 quadrants.  No CVA tenderness. Musculoskeletal: On examination of the right shoulder there is no ecchymosis or gross deformity however there is moderately tender to palpation.  Range of motion is completely restricted secondary to pain.  No tenderness on palpation of the elbow or wrist.  Radial pulses present.  Motor sensory function and capillary refill in the digits are present.  Skin is intact.  Moderate tenderness on palpation of the right lateral hip.  No bruising noted.  Patient is able to move right lower extremity with some discomfort.  No shortening or rotation of her leg is noted. Neurologic:  Normal speech and language. No gross focal neurologic deficits are appreciated.  Skin:  Skin is warm, dry and intact.  No ecchymosis or abrasions are noted. Psychiatric: Mood and affect are normal. Speech and behavior are  normal.  ____________________________________________   LABS (all labs ordered are listed, but only abnormal results are displayed)  Labs Reviewed - No data to display ____________________________________________  RADIOLOGY Leana Gamer,  personally viewed and evaluated these images (plain radiographs) as part of my medical decision making, as well as reviewing the written report by the radiologist.   Official radiology report(s): DG Humerus Right  Result Date: 09/12/2020 CLINICAL DATA:  70 year old female status post fall at 0300 hours. Pain. EXAM: RIGHT HUMERUS - 2+ VIEW COMPARISON:  Chest radiographs 03/06/2009. FINDINGS: Comminuted fracture of the proximal right humerus, epicenter at the humeral neck with impaction, 1/2 shaft width medial displacement, and perhaps a valgus angulation. The humeral head component appears to remain normally aligned with the glenoid. No right clavicle or scapula fracture identified. Alignment at the right elbow appears maintained. Visible right ribs appear intact. IMPRESSION: Comminuted, mildly displaced and impacted proximal right humerus fracture. Electronically Signed   By: Genevie Ann M.D.   On: 09/12/2020 10:20   DG Hip Unilat W or Wo Pelvis 2-3 Views Right  Result Date: 09/12/2020 CLINICAL DATA:  70 year old female status post fall at 0300 hours. Pain. EXAM: DG HIP (WITH OR WITHOUT PELVIS) 2-3V RIGHT COMPARISON:  CT Abdomen and Pelvis 03/30/2001. FINDINGS: Femoral heads are normally located. Hip joint spaces appear symmetric. No pelvis fracture identified. Chronic degenerative changes at the pubic symphysis. SI joints appear normal. Grossly intact proximal left femur. No proximal right femur fracture identified. Negative visible lower abdominal and pelvic visceral contours. IMPRESSION: No acute fracture or dislocation identified about the right hip or pelvis. Electronically Signed   By: Genevie Ann M.D.   On: 09/12/2020 10:21     ____________________________________________   PROCEDURES  Procedure(s) performed (including Critical Care):  Procedures   ____________________________________________   INITIAL IMPRESSION / ASSESSMENT AND PLAN / ED COURSE  As part of my medical decision making, I reviewed the following data within the electronic MEDICAL RECORD NUMBER Notes from prior ED visits and [place] Controlled Substance Database  70 year old female presents to the ED after a fall that occurred at 3 AM this morning when she was taking her dog out.  Patient denies a syncopal episode and states that she was able to get back into the house with the assistance of her son-in-law.  She denies any head injury.  Patient was given Percocet prior to x-rays for pain.  X-rays of the right shoulder show a comminuted, impacted fracture of the proximal humerus.  X-rays of the hip were negative for acute bony injury.  Patient was placed in a shoulder immobilizer using an extension.  Dr. Roland Rack is on-call for orthopedics and will follow patient up in the office next week.  Patient was written a prescription for Percocet however pharmacy called and states that patient chronically takes Percocet and has a large amount at home.  Patient is to ice and elevate to reduce swelling and help with pain.  Dr. Nicholaus Bloom information was given to patient and family member so that they can make arrangements to continue care. ____________________________________________   FINAL CLINICAL IMPRESSION(S) / ED DIAGNOSES  Final diagnoses:  Closed fracture of head of right humerus, initial encounter  Contusion of right hip, initial encounter  Fall in home, initial encounter     ED Discharge Orders         Ordered    oxyCODONE-acetaminophen (PERCOCET) 5-325 MG tablet  Every 6 hours PRN        09/12/20 1237          *Please note:  Kristin [name] was evaluated in Emergency Department on 09/12/2020 for the symptoms described in the history of present  illness. She was  evaluated in the context of the global COVID-19 pandemic, which necessitated consideration that the patient might be at risk for infection with the SARS-CoV-2 virus that causes COVID-19. Institutional protocols and algorithms that pertain to the evaluation of patients at risk for COVID-19 are in a state of rapid change based on information released by regulatory bodies including the CDC and federal and state organizations. These policies and algorithms were followed during the patient's care in the ED.  Some ED evaluations and interventions may be delayed as a result of limited staffing during and the pandemic.*   Note:  This document was prepared using Dragon voice recognition software and may include unintentional dictation errors.    Johnn Hai, PA-C 09/12/20 1354    Vanessa [place], MD 09/13/20 [phone]

## 2020-09-12 NOTE — ED Triage Notes (Signed)
Pt states she tripped going up the steps this morning around 3am while letting her dog outside. Pt c/o right arm and hip pain. Able to stand with a lot of lower back pain since the fall

## 2020-09-12 NOTE — Discharge Instructions (Addendum)
Call Monday to make an appointment at Dr. Nicholaus Bloom office which is listed on your discharge papers along with his address.  His office is located in the [facility] building next to the hospital.  Ice and elevation with pillows.  Pain medication was sent to your pharmacy.  This is 1 every 6 hours as needed for pain.  Be aware that this medication could cause drowsiness and increase your risk for falling.  Allow family members to take your dog out.

## 2020-09-18 ENCOUNTER — Ambulatory Visit
Admission: RE | Admit: 2020-09-18 | Discharge: 2020-09-18 | Disposition: A | Discharge status: Home / Self Care | Payer: 59 | Source: Ambulatory Visit | Attending: Radiation Oncology | Admitting: Radiation Oncology

## 2020-09-18 ENCOUNTER — Other Ambulatory Visit: Payer: Self-pay

## 2020-09-18 ENCOUNTER — Encounter: Payer: Self-pay | Admitting: Radiation Oncology

## 2020-09-18 VITALS — BP 102/52 | HR 73 | Temp 96.9°F | Resp 20 | Ht 62.0 in

## 2020-09-18 DIAGNOSIS — Z923 Personal history of irradiation: Secondary | ICD-10-CM | POA: Diagnosis not present

## 2020-09-18 DIAGNOSIS — W19XXXA Unspecified fall, initial encounter: Secondary | ICD-10-CM | POA: Insufficient documentation

## 2020-09-18 DIAGNOSIS — Z7901 Long term (current) use of anticoagulants: Secondary | ICD-10-CM | POA: Diagnosis not present

## 2020-09-18 DIAGNOSIS — C3491 Malignant neoplasm of unspecified part of right bronchus or lung: Secondary | ICD-10-CM

## 2020-09-18 DIAGNOSIS — C3431 Malignant neoplasm of lower lobe, right bronchus or lung: Secondary | ICD-10-CM | POA: Insufficient documentation

## 2020-09-18 DIAGNOSIS — Z7984 Long term (current) use of oral hypoglycemic drugs: Secondary | ICD-10-CM | POA: Insufficient documentation

## 2020-09-18 DIAGNOSIS — S42201A Unspecified fracture of upper end of right humerus, initial encounter for closed fracture: Secondary | ICD-10-CM | POA: Diagnosis not present

## 2020-09-18 DIAGNOSIS — Z79899 Other long term (current) drug therapy: Secondary | ICD-10-CM | POA: Diagnosis not present

## 2020-09-18 HISTORY — DX: Personal history of irradiation: Z92.3

## 2020-09-18 NOTE — Progress Notes (Signed)
PAIN: She is currently in no pain.  RESPIRATORY: Wheezing, Shortness of Breath  At Rest and Coughing  Productive and Color of Phlegm  yellow. Pt is on 5 liters/min via Patient connected to nasal cannula oxygen. Noted no skin abnormalities .  SWALLOWING/DIET: Pt denies dysphagia . The patient eats a regular, healthy diet. Reports eating smaller amounts than previously.  OTHER: Pt complains of fatigue, weakness, loss of sleep and poor appetite.  BP (!) 102/52 (BP Location: Left Arm, Patient Position: Sitting)   Pulse 73   Temp (!) 96.9 F (36.1 C) (Temporal)   Resp 20   Ht 5\' 2"  (1.575 m)   LMP  (LMP Unknown)   SpO2 95%   BMI 42.07 kg/m    Wt Readings from Last 3 Encounters:  09/12/20 230 lb (104.3 kg)  08/15/20 239 lb (108.4 kg)  07/02/20 243 lb (110.2 kg)

## 2020-09-18 NOTE — Progress Notes (Incomplete)
  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 She did report a productive cough, shortness of breath, and fatigue. She denied pain, difficulty swallowing, and skin irritation. Appetite remained fair.  Plan: The patient will follow-up with radiation oncology in one month.  ________________________________________________   Blair Promise, PhD, MD  This document serves as a record of services personally performed by Gery Pray, MD. It was created on his behalf by Clerance Lav, a trained medical scribe. The creation of this record is based on the scribe's personal observations and the provider's statements to them. This document has been checked and approved by the attending provider.

## 2020-09-18 NOTE — Progress Notes (Signed)
Radiation Oncology         [phone] ________________________________  Name: Kristin [name] MRN: [id number]  Date: 09/18/2020  DOB: December 03, 1950  Follow-Up Visit Note  CC: System, Provider Not In  Rexene Alberts, MD    ICD-10-CM   1. 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 Since the end of treatment, she was seen in the ED on 09/12/2020 for right shoulder and right hip pain status post fall. X-ray of right hip and pelvis was unremarkable. However, x-ray of right humerus showed a comminuted, mildly displaced and impacted proximal right humerus fracture.  On review of systems, she reports discomfort/pain in the right arm in light of her fracture. she denies changes in her breathing since completion of radiation therapy.  She denies any significant cough or hemoptysis.  He continues on 5 L of oxygen continuously..  ALLERGIES:  is allergic to altace [ramipril].  Meds: Current Outpatient Medications  Medication Sig Dispense Refill  . albuterol (VENTOLIN HFA) 108 (90 Base) MCG/ACT inhaler Inhale 2 puffs into the lungs every 4 (four) hours as needed for wheezing or shortness of breath.     Marland Kitchen amiodarone (PACERONE) 200 MG tablet Take 1 tablet (200 mg total) by mouth daily. 90 tablet 3  . apixaban (ELIQUIS) 5 MG TABS tablet Take 1 tablet (5 mg total) by mouth 2 (two) times daily. 60 tablet 5  . atorvastatin (LIPITOR) 80 MG tablet Take 80 mg by mouth at bedtime.     . carvedilol (COREG) 3.125 MG tablet Take 3.125 mg by  mouth 2 (two) times daily.    . citalopram (CELEXA) 40 MG tablet Take 40 mg by mouth daily.    Marland Kitchen docusate sodium (COLACE) 100 MG capsule Take 100 mg by mouth at bedtime as needed.    Marland Kitchen esomeprazole (NEXIUM) 40 MG capsule Take 40 mg by mouth daily.    Marland Kitchen ezetimibe (ZETIA) 10 MG tablet Take 1 tablet (10 mg total) by mouth daily. 90 tablet 3  . famotidine (PEPCID) 40 MG tablet Take 40 mg by mouth daily.    . fenofibrate 160 MG tablet Take 160 mg by mouth daily.    . ferrous sulfate 325 (65 FE) MG tablet Take 325 mg by mouth daily.    . furosemide (LASIX) 20 MG tablet Take 1 tablet (20 mg total) by mouth daily. 90 tablet 3  . gabapentin (NEURONTIN) 600 MG tablet Take 600 mg by mouth 2 (two) times daily.     . hydrOXYzine (VISTARIL) 50 MG capsule Take 50 mg by mouth every 6 (six) hours as needed for anxiety.    Marland Kitchen ipratropium-albuterol (DUONEB) 0.5-2.5 (3) MG/3ML SOLN Take 3 mLs by nebulization 4 (four) times daily.    . irbesartan (AVAPRO) 300 MG tablet Take 0.5 tablets (150 mg total) by mouth daily. 90 tablet 3  . levothyroxine (SYNTHROID, LEVOTHROID) 75 MCG tablet Take 75 mcg by mouth daily before breakfast.    . lidocaine (LIDODERM) 5 % SMARTSIG:Patch(s) Topical    . Melatonin 10 MG TABS Take 10 mg  by mouth at bedtime as needed (sleep).    . metFORMIN (GLUCOPHAGE) 500 MG tablet Take 500 mg by mouth 2 (two) times daily.    Marland Kitchen MOVANTIK 25 MG TABS tablet Take 25 mg by mouth daily.    . nicotine (NICODERM CQ - DOSED IN MG/24 HOURS) 21 mg/24hr patch Place 1 patch (21 mg total) onto the skin daily. 28 patch 5  . oxyCODONE-acetaminophen (PERCOCET) 5-325 MG tablet Take 1 tablet by mouth every 6 (six) hours as needed for severe pain. 20 tablet 0  . pantoprazole (PROTONIX) 40 MG tablet Take 40 mg by mouth 2 (two) times daily.    . potassium chloride SA (KLOR-CON) 20 MEQ tablet Take 1 tablet (20 mEq total) by mouth 2 (two) times daily. For one day. 2 tablet 0  . sitaGLIPtin (JANUVIA) 100 MG tablet Take 100 mg  by mouth daily.    . Tiotropium Bromide Monohydrate 2.5 MCG/ACT AERS Inhale 2 puffs into the lungs daily.     . [facility] ASPIRIN ADULT LOW DOSE 81 MG chewable tablet Chew 81 mg by mouth daily. (Patient not taking: Reported on 09/18/2020)    . Vitamin D, Ergocalciferol, (DRISDOL) 1.25 MG (50000 UNIT) CAPS capsule Take 1 capsule by mouth once a week.     No current facility-administered medications for this encounter.    Physical Findings: The patient is in no acute distress. Patient is alert and oriented.  Remains in wheelchair for examination  height is 5\' 2"  (1.575 m). Her temporal temperature is 96.9 F (36.1 C) (abnormal). Her blood pressure is 102/52 (abnormal) and her pulse is 73. Her respiration is 20 and oxygen saturation is 95%.    Lung exam shows bilateral wheezing throughout.  Patient has not taken her breathing treatment today and I recommended she use this when she returns home.  Heart has regular rate and rhythm. No palpable cervical, supraclavicular, or axillary adenopathy. Abdomen soft, non-tender, normal bowel sounds.   Lab Findings: Lab Results  Component Value Date   WBC 6.3 06/17/2020   HGB 12.1 06/17/2020   HCT 38.5 06/17/2020   MCV 93.9 06/17/2020   PLT 209 06/17/2020    Radiographic Findings: DG Humerus Right  Result Date: 09/12/2020 CLINICAL DATA:  71 year old female status post fall at 0300 hours. Pain. EXAM: RIGHT HUMERUS - 2+ VIEW COMPARISON:  Chest radiographs 03/06/2009. FINDINGS: Comminuted fracture of the proximal right humerus, epicenter at the humeral neck with impaction, 1/2 shaft width medial displacement, and perhaps a valgus angulation. The humeral head component appears to remain normally aligned with the glenoid. No right clavicle or scapula fracture identified. Alignment at the right elbow appears maintained. Visible right ribs appear intact. IMPRESSION: Comminuted, mildly displaced and impacted proximal right humerus fracture. Electronically Signed   By: Genevie Ann M.D.   On: 09/12/2020 10:20   DG Hip Unilat W or Wo Pelvis 2-3 Views Right  Result Date: 09/12/2020 CLINICAL DATA:  70 year old female status post fall at 0300 hours. Pain. EXAM: DG HIP (WITH OR WITHOUT PELVIS) 2-3V RIGHT COMPARISON:  CT Abdomen and Pelvis 03/30/2001. FINDINGS: Femoral heads are normally located. Hip joint spaces appear symmetric. No pelvis fracture identified. Chronic degenerative changes at the pubic symphysis. SI joints appear normal. Grossly intact proximal left femur. No proximal right femur fracture identified. Negative visible lower abdominal and pelvic visceral contours. IMPRESSION: No acute fracture or dislocation identified about the right hip or pelvis. Electronically Signed   By: Genevie Ann M.D.   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  She significant medical history  as above.  Plan: The patient will follow up with radiation oncology in 3 months. She will undergo a chest CT scan prior to that visit to evaluate post-treatment changes.    ____________________________________   Blair Promise, PhD, MD  This document serves as a record of services personally performed by Gery Pray, MD. It was created on his behalf by Clerance Lav, a trained medical scribe. The creation of this record is based on the scribe's personal observations and the provider's statements to them. This document has been checked and approved by the attending provider.

## 2020-09-23 ENCOUNTER — Other Ambulatory Visit: Payer: 59

## 2020-09-24 ENCOUNTER — Ambulatory Visit: Payer: 59 | Admitting: Cardiovascular Disease

## 2020-09-25 ENCOUNTER — Other Ambulatory Visit: Payer: Self-pay

## 2020-09-25 ENCOUNTER — Emergency Department: Payer: 59

## 2020-09-25 ENCOUNTER — Inpatient Hospital Stay
Admission: EM | Admit: 2020-09-25 | Discharge: 2020-10-03 | DRG: 291 | Disposition: A | Discharge status: Skilled Nursing Facility | Payer: 59 | Attending: Internal Medicine | Admitting: Internal Medicine

## 2020-09-25 DIAGNOSIS — Z515 Encounter for palliative care: Secondary | ICD-10-CM | POA: Diagnosis not present

## 2020-09-25 DIAGNOSIS — I4892 Unspecified atrial flutter: Secondary | ICD-10-CM | POA: Diagnosis present

## 2020-09-25 DIAGNOSIS — I350 Nonrheumatic aortic (valve) stenosis: Secondary | ICD-10-CM | POA: Diagnosis not present

## 2020-09-25 DIAGNOSIS — R001 Bradycardia, unspecified: Secondary | ICD-10-CM

## 2020-09-25 DIAGNOSIS — I482 Chronic atrial fibrillation, unspecified: Secondary | ICD-10-CM | POA: Diagnosis present

## 2020-09-25 DIAGNOSIS — E119 Type 2 diabetes mellitus without complications: Secondary | ICD-10-CM

## 2020-09-25 DIAGNOSIS — Z923 Personal history of irradiation: Secondary | ICD-10-CM

## 2020-09-25 DIAGNOSIS — I5033 Acute on chronic diastolic (congestive) heart failure: Secondary | ICD-10-CM

## 2020-09-25 DIAGNOSIS — E1140 Type 2 diabetes mellitus with diabetic neuropathy, unspecified: Secondary | ICD-10-CM | POA: Diagnosis present

## 2020-09-25 DIAGNOSIS — I472 Ventricular tachycardia: Secondary | ICD-10-CM | POA: Diagnosis not present

## 2020-09-25 DIAGNOSIS — W19XXXD Unspecified fall, subsequent encounter: Secondary | ICD-10-CM | POA: Diagnosis present

## 2020-09-25 DIAGNOSIS — Z6841 Body Mass Index (BMI) 40.0 and over, adult: Secondary | ICD-10-CM | POA: Diagnosis not present

## 2020-09-25 DIAGNOSIS — Z8249 Family history of ischemic heart disease and other diseases of the circulatory system: Secondary | ICD-10-CM

## 2020-09-25 DIAGNOSIS — J9601 Acute respiratory failure with hypoxia: Secondary | ICD-10-CM | POA: Diagnosis not present

## 2020-09-25 DIAGNOSIS — Z7901 Long term (current) use of anticoagulants: Secondary | ICD-10-CM

## 2020-09-25 DIAGNOSIS — R079 Chest pain, unspecified: Secondary | ICD-10-CM | POA: Diagnosis not present

## 2020-09-25 DIAGNOSIS — I252 Old myocardial infarction: Secondary | ICD-10-CM

## 2020-09-25 DIAGNOSIS — F17210 Nicotine dependence, cigarettes, uncomplicated: Secondary | ICD-10-CM | POA: Diagnosis present

## 2020-09-25 DIAGNOSIS — J9622 Acute and chronic respiratory failure with hypercapnia: Secondary | ICD-10-CM | POA: Diagnosis present

## 2020-09-25 DIAGNOSIS — Z20822 Contact with and (suspected) exposure to covid-19: Secondary | ICD-10-CM | POA: Diagnosis present

## 2020-09-25 DIAGNOSIS — Z66 Do not resuscitate: Secondary | ICD-10-CM | POA: Diagnosis not present

## 2020-09-25 DIAGNOSIS — E662 Morbid (severe) obesity with alveolar hypoventilation: Secondary | ICD-10-CM | POA: Diagnosis present

## 2020-09-25 DIAGNOSIS — E1122 Type 2 diabetes mellitus with diabetic chronic kidney disease: Secondary | ICD-10-CM | POA: Diagnosis present

## 2020-09-25 DIAGNOSIS — G2581 Restless legs syndrome: Secondary | ICD-10-CM | POA: Diagnosis present

## 2020-09-25 DIAGNOSIS — Z79890 Hormone replacement therapy (postmenopausal): Secondary | ICD-10-CM

## 2020-09-25 DIAGNOSIS — C3491 Malignant neoplasm of unspecified part of right bronchus or lung: Secondary | ICD-10-CM | POA: Diagnosis present

## 2020-09-25 DIAGNOSIS — I10 Essential (primary) hypertension: Secondary | ICD-10-CM | POA: Diagnosis present

## 2020-09-25 DIAGNOSIS — E785 Hyperlipidemia, unspecified: Secondary | ICD-10-CM | POA: Diagnosis present

## 2020-09-25 DIAGNOSIS — J690 Pneumonitis due to inhalation of food and vomit: Secondary | ICD-10-CM | POA: Diagnosis not present

## 2020-09-25 DIAGNOSIS — I272 Pulmonary hypertension, unspecified: Secondary | ICD-10-CM | POA: Diagnosis present

## 2020-09-25 DIAGNOSIS — I130 Hypertensive heart and chronic kidney disease with heart failure and stage 1 through stage 4 chronic kidney disease, or unspecified chronic kidney disease: Principal | ICD-10-CM | POA: Diagnosis present

## 2020-09-25 DIAGNOSIS — I509 Heart failure, unspecified: Secondary | ICD-10-CM

## 2020-09-25 DIAGNOSIS — Z23 Encounter for immunization: Secondary | ICD-10-CM

## 2020-09-25 DIAGNOSIS — K219 Gastro-esophageal reflux disease without esophagitis: Secondary | ICD-10-CM | POA: Diagnosis present

## 2020-09-25 DIAGNOSIS — T17908A Unspecified foreign body in respiratory tract, part unspecified causing other injury, initial encounter: Secondary | ICD-10-CM | POA: Diagnosis not present

## 2020-09-25 DIAGNOSIS — J441 Chronic obstructive pulmonary disease with (acute) exacerbation: Secondary | ICD-10-CM | POA: Diagnosis present

## 2020-09-25 DIAGNOSIS — N179 Acute kidney failure, unspecified: Secondary | ICD-10-CM | POA: Diagnosis present

## 2020-09-25 DIAGNOSIS — Z809 Family history of malignant neoplasm, unspecified: Secondary | ICD-10-CM

## 2020-09-25 DIAGNOSIS — G928 Other toxic encephalopathy: Secondary | ICD-10-CM | POA: Diagnosis present

## 2020-09-25 DIAGNOSIS — I4891 Unspecified atrial fibrillation: Secondary | ICD-10-CM | POA: Diagnosis not present

## 2020-09-25 DIAGNOSIS — I480 Paroxysmal atrial fibrillation: Secondary | ICD-10-CM | POA: Diagnosis present

## 2020-09-25 DIAGNOSIS — R1110 Vomiting, unspecified: Secondary | ICD-10-CM

## 2020-09-25 DIAGNOSIS — I5031 Acute diastolic (congestive) heart failure: Secondary | ICD-10-CM | POA: Diagnosis not present

## 2020-09-25 DIAGNOSIS — Z888 Allergy status to other drugs, medicaments and biological substances status: Secondary | ICD-10-CM

## 2020-09-25 DIAGNOSIS — Z9981 Dependence on supplemental oxygen: Secondary | ICD-10-CM

## 2020-09-25 DIAGNOSIS — G4733 Obstructive sleep apnea (adult) (pediatric): Secondary | ICD-10-CM | POA: Diagnosis not present

## 2020-09-25 DIAGNOSIS — E1151 Type 2 diabetes mellitus with diabetic peripheral angiopathy without gangrene: Secondary | ICD-10-CM | POA: Diagnosis present

## 2020-09-25 DIAGNOSIS — J9621 Acute and chronic respiratory failure with hypoxia: Secondary | ICD-10-CM | POA: Diagnosis present

## 2020-09-25 DIAGNOSIS — S42301D Unspecified fracture of shaft of humerus, right arm, subsequent encounter for fracture with routine healing: Secondary | ICD-10-CM

## 2020-09-25 DIAGNOSIS — Z7189 Other specified counseling: Secondary | ICD-10-CM | POA: Diagnosis not present

## 2020-09-25 DIAGNOSIS — F419 Anxiety disorder, unspecified: Secondary | ICD-10-CM | POA: Diagnosis present

## 2020-09-25 DIAGNOSIS — D631 Anemia in chronic kidney disease: Secondary | ICD-10-CM | POA: Diagnosis present

## 2020-09-25 DIAGNOSIS — Z79899 Other long term (current) drug therapy: Secondary | ICD-10-CM

## 2020-09-25 DIAGNOSIS — E875 Hyperkalemia: Secondary | ICD-10-CM | POA: Diagnosis present

## 2020-09-25 DIAGNOSIS — N1831 Chronic kidney disease, stage 3a: Secondary | ICD-10-CM | POA: Diagnosis present

## 2020-09-25 DIAGNOSIS — E039 Hypothyroidism, unspecified: Secondary | ICD-10-CM | POA: Diagnosis present

## 2020-09-25 DIAGNOSIS — I2510 Atherosclerotic heart disease of native coronary artery without angina pectoris: Secondary | ICD-10-CM | POA: Diagnosis present

## 2020-09-25 DIAGNOSIS — Z7984 Long term (current) use of oral hypoglycemic drugs: Secondary | ICD-10-CM

## 2020-09-25 LAB — RESP PANEL BY RT-PCR (FLU A&B, COVID) ARPGX2
Influenza A by PCR: NEGATIVE
Influenza B by PCR: NEGATIVE
SARS Coronavirus 2 by RT PCR: NEGATIVE

## 2020-09-25 LAB — BLOOD GAS, VENOUS
Acid-Base Excess: 5.7 mmol/L — ABNORMAL HIGH (ref 0.0–2.0)
Bicarbonate: 35 mmol/L — ABNORMAL HIGH (ref 20.0–28.0)
Delivery systems: POSITIVE
FIO2: 0.4
O2 Saturation: 84.5 %
PEEP: 5 cmH2O
Patient temperature: 37
Pressure support: 12 cmH2O
pCO2, Ven: 78 mmHg (ref 44.0–60.0)
pH, Ven: 7.26 (ref 7.250–7.430)
pO2, Ven: 57 mmHg — ABNORMAL HIGH (ref 32.0–45.0)

## 2020-09-25 LAB — CBC WITH DIFFERENTIAL/PLATELET
Abs Immature Granulocytes: 0.07 10*3/uL (ref 0.00–0.07)
Basophils Absolute: 0 10*3/uL (ref 0.0–0.1)
Basophils Relative: 0 %
Eosinophils Absolute: 0.1 10*3/uL (ref 0.0–0.5)
Eosinophils Relative: 0 %
HCT: 34 % — ABNORMAL LOW (ref 36.0–46.0)
Hemoglobin: 10.8 g/dL — ABNORMAL LOW (ref 12.0–15.0)
Immature Granulocytes: 1 %
Lymphocytes Relative: 3 %
Lymphs Abs: 0.4 10*3/uL — ABNORMAL LOW (ref 0.7–4.0)
MCH: 29.3 pg (ref 26.0–34.0)
MCHC: 31.8 g/dL (ref 30.0–36.0)
MCV: 92.4 fL (ref 80.0–100.0)
Monocytes Absolute: 1 10*3/uL (ref 0.1–1.0)
Monocytes Relative: 8 %
Neutro Abs: 12.2 10*3/uL — ABNORMAL HIGH (ref 1.7–7.7)
Neutrophils Relative %: 88 %
Platelets: 352 10*3/uL (ref 150–400)
RBC: 3.68 MIL/uL — ABNORMAL LOW (ref 3.87–5.11)
RDW: 14.6 % (ref 11.5–15.5)
WBC: 13.8 10*3/uL — ABNORMAL HIGH (ref 4.0–10.5)
nRBC: 0 % (ref 0.0–0.2)

## 2020-09-25 LAB — COMPREHENSIVE METABOLIC PANEL
ALT: 24 U/L (ref 0–44)
AST: 32 U/L (ref 15–41)
Albumin: 3 g/dL — ABNORMAL LOW (ref 3.5–5.0)
Alkaline Phosphatase: 42 U/L (ref 38–126)
Anion gap: 9 (ref 5–15)
BUN: 58 mg/dL — ABNORMAL HIGH (ref 8–23)
CO2: 31 mmol/L (ref 22–32)
Calcium: 8 mg/dL — ABNORMAL LOW (ref 8.9–10.3)
Chloride: 86 mmol/L — ABNORMAL LOW (ref 98–111)
Creatinine, Ser: 2.16 mg/dL — ABNORMAL HIGH (ref 0.44–1.00)
GFR, Estimated: 24 mL/min — ABNORMAL LOW (ref >60)
Glucose, Bld: 155 mg/dL — ABNORMAL HIGH (ref 70–99)
Potassium: 5.9 mmol/L — ABNORMAL HIGH (ref 3.5–5.1)
Sodium: 126 mmol/L — ABNORMAL LOW (ref 135–145)
Total Bilirubin: 0.5 mg/dL (ref 0.3–1.2)
Total Protein: 6.9 g/dL (ref 6.5–8.1)

## 2020-09-25 LAB — TROPONIN I (HIGH SENSITIVITY): Troponin I (High Sensitivity): 12 ng/L (ref <18)

## 2020-09-25 MED ORDER — FUROSEMIDE 10 MG/ML IJ SOLN
80.0000 mg | Freq: Once | INTRAMUSCULAR | Status: AC
  Administered 2020-09-25: 80 mg via INTRAVENOUS
  Filled 2020-09-25: qty 8

## 2020-09-25 MED ORDER — METHYLPREDNISOLONE SODIUM SUCC 125 MG IJ SOLR
125.0000 mg | Freq: Once | INTRAMUSCULAR | Status: AC
  Administered 2020-09-25: 125 mg via INTRAVENOUS
  Filled 2020-09-25: qty 2

## 2020-09-25 MED ORDER — IPRATROPIUM-ALBUTEROL 0.5-2.5 (3) MG/3ML IN SOLN
9.0000 mL | Freq: Once | RESPIRATORY_TRACT | Status: AC
  Administered 2020-09-25: 9 mL via RESPIRATORY_TRACT
  Filled 2020-09-25: qty 9

## 2020-09-25 MED ORDER — NALOXONE HCL 2 MG/2ML IJ SOSY
0.4000 mg | PREFILLED_SYRINGE | Freq: Once | INTRAMUSCULAR | Status: AC
  Administered 2020-09-25: 0.4 mg via INTRAVENOUS
  Filled 2020-09-25: qty 2

## 2020-09-25 NOTE — ED Notes (Signed)
Mitt[place] applied due to patient removing bipap mask and requiring repeated instruction to not remove mask. Mask readjusted for comfort.

## 2020-09-25 NOTE — ED Notes (Addendum)
Pt remains drowsy after narcan. Tolerating bipap well.

## 2020-09-25 NOTE — ED Provider Notes (Signed)
[facility] Emergency Department Provider Note   ____________________________________________   Event Date/Time   First MD Initiated Contact with Patient 09/25/20 2154     (approximate)  I have reviewed the triage vital signs and the nursing notes.   HISTORY  Chief Complaint Respiratory Distress    HPI Torah A Moening is a 70 y.o. female with past medical history of hypertension, diabetes, COPD on 2 L nasal cannula, diastolic CHF, lung cancer, and atrial fibrillation on Eliquis who presents to the ED for respiratory distress.  History is limited due to patient's acute respiratory distress.  Per EMS, patient developed increasing difficulty breathing a couple of hours prior to arrival and has become increasingly confused over that time.  When they arrived, she had O2 sats of 88% on her usual 2 L nasal cannula with increased work of breathing.  She was subsequently placed on CPAP but continued to be somnolent during transport.  She was noted to have a heart rate in the 40s, was subsequently given a total of 1 mg of atropine with improvement in her heart rate.  On arrival, patient endorses shortness of breath but denies any pain in her chest.  She states that her breathing has felt better with the mask in place.        Past Medical History:  Diagnosis Date  . Anxiety   . Asthma   . CHF (congestive heart failure) ([place])    2018  . COPD (chronic obstructive pulmonary disease) ([place])   . Diabetes mellitus without complication ([place])   . Dyspnea   . GERD (gastroesophageal reflux disease)   . History of hiatal hernia   . History of orthopnea   . History of radiation therapy 07/29/20-08/08/20   Right Lung, SBRT Dr. Gery Pray  . Hypothyroidism   . Neuropathy   . Oxygen deficiency    3L/HS  . Pain    BACK/DDD  . Peripheral vascular disease ([place])   . RLS (restless legs syndrome)   . Sleep apnea   . Wheezing     Patient Active Problem List   Diagnosis Date  Noted  . Acute on chronic respiratory failure with hypoxia and hypercapnia ([place]) 09/25/2020  . Lung cancer ([place]) 05/26/2020  . Atrial flutter with rapid ventricular response ([place]) 05/26/2020  . Demand ischemia ([place])   . Acute on chronic heart failure with preserved ejection fraction (HFpEF) ([place])   . Aortic stenosis-moderate to severe 03/2020   . Obesity, Class III, BMI 40-49.9 (morbid obesity) ([place]) 03/29/2020  . Chronic respiratory failure with hypoxia ([place]) 03/29/2020  . OSA (obstructive sleep apnea) 03/29/2020  . Essential hypertension 03/29/2020  . Hypothyroidism 03/29/2020  . Chest pain 03/29/2020  . Elevated troponin 03/29/2020  . Chronic diastolic (congestive) heart failure ([place]) 07/12/2018  . Type 2 diabetes mellitus without complication ([place]) 36/14/4315  . COPD with acute exacerbation ([place]) 07/12/2018  . Tobacco use 07/12/2018  . Hypotension 07/12/2018  . Respiratory failure ([place]) 07/04/2018    Past Surgical History:  Procedure Laterality Date  . BREAST SURGERY    . CATARACT EXTRACTION W/PHACO Right 03/02/2018   Procedure: CATARACT EXTRACTION PHACO AND INTRAOCULAR LENS PLACEMENT ([place]);  Surgeon: Marchia Meiers, MD;  Location: [facility] ORS;  Service: Ophthalmology;  Laterality: Right;  [place] 01:15 CDE 16.46 Fluid pack lot # [id number]  . CATARACT EXTRACTION W/PHACO Left 04/27/2018   Procedure: CATARACT EXTRACTION PHACO AND INTRAOCULAR LENS PLACEMENT ([place])- LEFT DIABETIC;  Surgeon: Marchia Meiers, MD;  Location: [facility] ORS;  Service: Ophthalmology;  Laterality: Left;  Lot # [id number] H [place]: 00:46.3 CDE: 8.07   . CYST EXCISION     FOREHEAD  . FOOT SURGERY     CYST  . RIGHT/LEFT HEART CATH AND CORONARY ANGIOGRAPHY N/A 03/31/2020   Procedure: RIGHT/LEFT HEART CATH AND CORONARY ANGIOGRAPHY;  Surgeon: Wellington Hampshire, MD;  Location: [place];  Service: Cardiovascular;  Laterality: N/A;  . TUBAL LIGATION      Prior to Admission medications   Medication Sig Start Date End  Date Taking? Authorizing Provider  albuterol (VENTOLIN HFA) 108 (90 Base) MCG/ACT inhaler Inhale 2 puffs into the lungs every 4 (four) hours as needed for wheezing or shortness of breath.     [provider]  amiodarone (PACERONE) 200 MG tablet Take 1 tablet (200 mg total) by mouth daily. 06/13/20   Dunn, Areta Haber, PA-C  apixaban (ELIQUIS) 5 MG TABS tablet Take 1 tablet (5 mg total) by mouth 2 (two) times daily. 05/29/20   Dunn, Areta Haber, PA-C  atorvastatin (LIPITOR) 80 MG tablet Take 80 mg by mouth at bedtime.  09/05/19   [provider]  carvedilol (COREG) 3.125 MG tablet Take 3.125 mg by mouth 2 (two) times daily. 06/14/20   [provider]  citalopram (CELEXA) 40 MG tablet Take 40 mg by mouth daily. 06/14/20   [provider]  [facility] ASPIRIN ADULT LOW DOSE 81 MG chewable tablet Chew 81 mg by mouth daily. Patient not taking: Reported on 09/18/2020 03/19/20   [provider]  docusate sodium (COLACE) 100 MG capsule Take 100 mg by mouth at bedtime as needed. 03/19/20   [provider]  esomeprazole (NEXIUM) 40 MG capsule Take 40 mg by mouth daily. 10/01/19   [provider]  ezetimibe (ZETIA) 10 MG tablet Take 1 tablet (10 mg total) by mouth daily. 06/18/20 06/13/21  Marrianne Mood D, PA-C  famotidine (PEPCID) 40 MG tablet Take 40 mg by mouth daily.    [provider]  fenofibrate 160 MG tablet Take 160 mg by mouth daily.    [provider]  ferrous sulfate 325 (65 FE) MG tablet Take 325 mg by mouth daily. 06/14/20   [provider]  furosemide (LASIX) 20 MG tablet Take 1 tablet (20 mg total) by mouth daily. 08/15/20 08/10/21  Marrianne Mood D, PA-C  gabapentin (NEURONTIN) 600 MG tablet Take 600 mg by mouth 2 (two) times daily.  11/20/19   [provider]  hydrOXYzine (VISTARIL) 50 MG capsule Take 50 mg by mouth every 6 (six) hours as needed for anxiety. 03/14/20   [provider]  ipratropium-albuterol  (DUONEB) 0.5-2.5 (3) MG/3ML SOLN Take 3 mLs by nebulization 4 (four) times daily. 08/07/20   [provider]  irbesartan (AVAPRO) 300 MG tablet Take 0.5 tablets (150 mg total) by mouth daily. 06/30/20 06/25/21  Loel Dubonnet, NP  levothyroxine (SYNTHROID, LEVOTHROID) 75 MCG tablet Take 75 mcg by mouth daily before breakfast.    [provider]  lidocaine (LIDODERM) 5 % SMARTSIG:Patch(s) Topical 05/28/20   [provider]  Melatonin 10 MG TABS Take 10 mg by mouth at bedtime as needed (sleep).    [provider]  metFORMIN (GLUCOPHAGE) 500 MG tablet Take 500 mg by mouth 2 (two) times daily. 09/19/19   [provider]  MOVANTIK 25 MG TABS tablet Take 25 mg by mouth daily. 07/21/20   [provider]  nicotine (NICODERM CQ - DOSED IN MG/24 HOURS) 21 mg/24hr patch  Place 1 patch (21 mg total) onto the skin daily. 08/15/20   Marrianne Mood D, PA-C  oxyCODONE-acetaminophen (PERCOCET) 5-325 MG tablet Take 1 tablet by mouth every 6 (six) hours as needed for severe pain. 09/12/20   Johnn Hai, PA-C  pantoprazole (PROTONIX) 40 MG tablet Take 40 mg by mouth 2 (two) times daily. 08/13/20   [provider]  potassium chloride SA (KLOR-CON) 20 MEQ tablet Take 1 tablet (20 mEq total) by mouth 2 (two) times daily. For one day. 06/18/20   Marrianne Mood D, PA-C  sitaGLIPtin (JANUVIA) 100 MG tablet Take 100 mg by mouth daily.    [provider]  Tiotropium Bromide Monohydrate 2.5 MCG/ACT AERS Inhale 2 puffs into the lungs daily.     [provider]  Vitamin D, Ergocalciferol, (DRISDOL) 1.25 MG (50000 UNIT) CAPS capsule Take 1 capsule by mouth once a week. 08/14/20   [provider]    Allergies Altace [ramipril]  Family History  Problem Relation Age of Onset  . Heart disease Mother   . Cancer Father     Social History Social History   Tobacco Use  . Smoking status: Current Every Day Smoker    Packs/day: 1.00     Years: 50.00    Pack years: 50.00    Types: Cigarettes  . Smokeless tobacco: Never Used  Vaping Use  . Vaping Use: Never used  Substance Use Topics  . Alcohol use: Not Currently    Review of Systems Unable to obtain due to respiratory distress and altered mental status  ____________________________________________   PHYSICAL EXAM:  VITAL SIGNS: ED Triage Vitals  Enc Vitals Group     BP      Pulse      Resp      Temp      Temp src      SpO2      Weight      Height      Head Circumference      Peak Flow      Pain Score      Pain Loc      Pain Edu?      Excl. in [place]?     Constitutional: Somnolent but arousable to voice. Eyes: Conjunctivae are normal.  Pupils equal round and reactive to light bilaterally. Head: Atraumatic. Nose: No congestion/rhinnorhea. Mouth/Throat: Mucous membranes are moist. Neck: Normal ROM Cardiovascular: Normal rate, regular rhythm. Grossly normal heart sounds. Respiratory: Tachypneic with increased respiratory effort.  No retractions. Lungs with wheezing and rales bilaterally. Gastrointestinal: Soft and nontender. No distention. Genitourinary: deferred Musculoskeletal: No lower extremity tenderness nor edema.  Right upper extremity in sling. Neurologic:  Normal speech and language. No gross focal neurologic deficits are appreciated. Skin:  Skin is warm, dry and intact. No rash noted. Psychiatric: Unable to assess.  ____________________________________________   LABS (all labs ordered are listed, but only abnormal results are displayed)  Labs Reviewed  CBC WITH DIFFERENTIAL/PLATELET - Abnormal; Notable for the following components:      Result Value   WBC 13.8 (*)    RBC 3.68 (*)    Hemoglobin 10.8 (*)    HCT 34.0 (*)    Neutro Abs 12.2 (*)    Lymphs Abs 0.4 (*)    All other components within normal limits  COMPREHENSIVE METABOLIC PANEL - Abnormal; Notable for the following components:   Sodium 126 (*)    Potassium 5.9 (*)     Chloride 86 (*)    Glucose,  Bld 155 (*)    BUN 58 (*)    Creatinine, Ser 2.16 (*)    Calcium 8.0 (*)    Albumin 3.0 (*)    GFR, Estimated 24 (*)    All other components within normal limits  BLOOD GAS, VENOUS - Abnormal; Notable for the following components:   pCO2, Ven 78 (*)    pO2, Ven 57.0 (*)    Bicarbonate 35.0 (*)    Acid-Base Excess 5.7 (*)    All other components within normal limits  RESP PANEL BY RT-PCR (FLU A&B, COVID) ARPGX2  BRAIN NATRIURETIC PEPTIDE  TROPONIN I (HIGH SENSITIVITY)  TROPONIN I (HIGH SENSITIVITY)   ____________________________________________  EKG  ED ECG REPORT I, Blake Divine, the attending physician, personally viewed and interpreted this ECG.   Date: 09/25/2020  EKG Time: 21:56  Rate: 60  Rhythm: normal sinus rhythm  Axis: Normal  Intervals:none  ST&T Change: None   PROCEDURES  Procedure(s) performed (including Critical Care):  .Critical Care Performed by: Blake Divine, MD Authorized by: Blake Divine, MD   Critical care provider statement:    Critical care time (minutes):  45   Critical care time was exclusive of:  Separately billable procedures and treating other patients and teaching time   Critical care was necessary to treat or prevent imminent or life-threatening deterioration of the following conditions:  Respiratory failure   Critical care was time spent personally by me on the following activities:  Discussions with consultants, evaluation of patient's response to treatment, examination of patient, ordering and performing treatments and interventions, ordering and review of laboratory studies, ordering and review of radiographic studies, pulse oximetry, re-evaluation of patient's condition, obtaining history from patient or surrogate and review of old charts   I assumed direction of critical care for this patient from another provider in my specialty: no     Care discussed with: admitting provider        ____________________________________________   INITIAL IMPRESSION / ASSESSMENT AND PLAN / ED COURSE       70 year old female with past medical history of hypertension, diabetes, diastolic CHF, COPD on 2 L, lung cancer, and atrial fibrillation on Eliquis who presents to the ED for shortness of breath and altered mental status.  On arrival, patient is somnolent but easily arousable to voice, does have increased work of breathing with wheezing and rales throughout.  She was transitioned to BiPAP, given DuoNeb x3 and Solu-Medrol.  She states that she feels better with the mask in place.  She was recently prescribed oxycodone for right proximal humerus fracture, was given a trial dose of Narcan with no change in her mental status.  VBG does show hypercapnic respiratory failure which should improve with BiPAP.  Chest x-ray reviewed by me and shows diffuse pulmonary edema, we will give dose of IV Lasix.  Patient additionally noted to be in a junctional rhythm but with stable blood pressure at this time.  Labs remarkable for mild hyperkalemia associated with AKI, which should improve following dose of Lasix.  Case discussed with hospitalist for admission.      ____________________________________________   FINAL CLINICAL IMPRESSION(S) / ED DIAGNOSES  Final diagnoses:  Acute on chronic respiratory failure with hypoxia and hypercapnia (HCC)  COPD exacerbation (HCC)  Acute on chronic diastolic congestive heart failure [facility])     ED Discharge Orders    None       Note:  This document was prepared using Dragon voice recognition software and may include unintentional dictation  errors.   Blake Divine, MD 09/25/20 2351

## 2020-09-25 NOTE — ED Triage Notes (Addendum)
Pt presents via EMS from home for c/o AMS x 1 hr per family. Per EMS, on scene pt was bradycardic with HR in the 40s and BP 116/40.  Pt was given total of 1mg  atropine and HR increased to 65 with BP 115/68. Pt was 88% on home o2 [place] 2L, EMS placed CPAP with additional 6L [place], O2 improved to 98%. Pt opens eyes to verbal stimuli, confused on arrival.  Hx of CHF, COPD, HTN, DM.   Pt has R arm sling in place due to R humerus fx in April.

## 2020-09-25 NOTE — H&P (Addendum)
History and Physical    Kristin [name] DQQ:[id number] DOB: [birth year] DOA: 09/25/2020  PCP: [facility]   Patient coming from: Home  I have personally briefly reviewed patient's old medical records in [place]  Chief Complaint: Respiratory distress  HPI: [name] is a 70 y.o. 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  On arrival of EMS O2 sat was 88% on 2 L with heart rate in the 40s improving with atropine.  She was placed on CPAP ED course: On arrival, she was a bit more responsive, tachypneic at 25 and was transitioned to BiPAP.  Afebrile with BP 122/59 pulse of 68.  Venous blood gas on BiPAP FiO2 0.4 with PCO2 of 78 and pH 7.26.  CBC with leukocytosis of 13,800 and hemoglobin of 10.8.  Other labs significant for creatinine of 2.16, up from baseline of 1.16 with potassium of 5.9, sodium 126.  Troponin normal at 12.  BNP pending.  COVID and flu negative EKG, personally viewed and interpreted junctional rhythm at 60 with no acute ST-T wave changes Imaging: Chest x-ray: Cardiomegaly with equivocal progression.  Increasing peribronchial and interstitial thickening may be pulmonary edema or infection.  Overall findings suggest CHF  Patient treated with IV Lasix as well as DuoNeb and Solu-Medrol.  She also received a dose of Narcan without improvement.  She was continued on BiPAP and slowly showed improving alertness per EDP  however at the time of my assessment, patient is very lethargic and somnolent and difficult to arouse.  Hospitalist consulted for admission.  Review of Systems: unable to obtain due to altered mentation   Past Medical History:  Diagnosis Date  . Anxiety   . Asthma   . CHF (congestive heart failure) ([place])    2018  . COPD (chronic obstructive pulmonary disease) ([place])   . Diabetes mellitus without complication ([place])   . Dyspnea   . GERD (gastroesophageal reflux disease)   . History of hiatal hernia   . History of orthopnea   . History of radiation therapy 07/29/20-08/08/20   Right Lung, SBRT Dr. Gery Pray  . Hypothyroidism   . Neuropathy   . Oxygen deficiency    3L/HS  . Pain    BACK/DDD  . Peripheral vascular disease ([place])   . RLS (restless legs syndrome)   . Sleep apnea   . Wheezing     Past Surgical History:  Procedure Laterality Date  . BREAST SURGERY    . CATARACT EXTRACTION W/PHACO Right 03/02/2018   Procedure: CATARACT EXTRACTION PHACO AND INTRAOCULAR LENS PLACEMENT ([place]);  Surgeon: Marchia Meiers, MD;  Location: [facility] ORS;  Service: Ophthalmology;  Laterality: Right;  [place] 01:15 CDE 16.46 Fluid pack lot # [id number]  . CATARACT EXTRACTION W/PHACO Left 04/27/2018   Procedure: CATARACT EXTRACTION PHACO AND INTRAOCULAR LENS PLACEMENT ([place])- LEFT DIABETIC;  Surgeon: Marchia Meiers, MD;  Location: [facility] OR[facility];  Service: Ophthalmology;  Laterality: Left;  Lot # [id number] H [place]: 00:46.3 CDE: 8.07   . CYST  EXCISION     FOREHEAD  . FOOT SURGERY     CYST  . RIGHT/LEFT HEART CATH AND CORONARY ANGIOGRAPHY N/A 03/31/2020   Procedure: RIGHT/LEFT HEART CATH AND CORONARY ANGIOGRAPHY;  Surgeon: Wellington Hampshire, MD;  Location: [facility];  Service: Cardiovascular;  Laterality: N/A;  . TUBAL LIGATION       reports that she has been smoking cigarettes. She has a 50.00 pack-year smoking history. She has never used smokeless tobacco. She reports previous alcohol use. No history on  file for drug use.  Allergies  Allergen Reactions  . Altace [Ramipril] Swelling    Family History  Problem Relation Age of Onset  . Heart disease Mother   . Cancer Father       Prior to Admission medications   Medication Sig Start Date End Date Taking? Authorizing Provider  albuterol (VENTOLIN HFA) 108 (90 Base) MCG/ACT inhaler Inhale 2 puffs into the lungs every 4 (four) hours as needed for wheezing or shortness of breath.     [provider]  amiodarone (PACERONE) 200 MG tablet Take 1 tablet (200 mg total) by mouth daily. 06/13/20   Dunn, Areta Haber, PA-C  apixaban (ELIQUIS) 5 MG TABS tablet Take 1 tablet (5 mg total) by mouth 2 (two) times daily. 05/29/20   Dunn, Areta Haber, PA-C  atorvastatin (LIPITOR) 80 MG tablet Take 80 mg by mouth at bedtime.  09/05/19   [provider]  carvedilol (COREG) 3.125 MG tablet Take 3.125 mg by mouth 2 (two) times daily. 06/14/20   [provider]  citalopram (CELEXA) 40 MG tablet Take 40 mg by mouth daily. 06/14/20   [provider]  [facility] ASPIRIN ADULT LOW DOSE 81 MG chewable tablet Chew 81 mg by mouth daily. Patient not taking: Reported on 09/18/2020 03/19/20   [provider]  docusate sodium (COLACE) 100 MG capsule Take 100 mg by mouth at bedtime as needed. 03/19/20   [provider]  esomeprazole (NEXIUM) 40 MG capsule Take 40 mg by mouth daily. 10/01/19   [provider]  ezetimibe (ZETIA) 10 MG tablet Take 1 tablet (10 mg total) by mouth daily. 06/18/20 06/13/21  Marrianne Mood D, PA-C  famotidine (PEPCID) 40 MG tablet Take 40 mg by mouth daily.    [provider]  fenofibrate 160 MG tablet Take 160 mg by mouth daily.    [provider]  ferrous sulfate 325 (65 FE) MG tablet Take 325 mg by mouth daily. 06/14/20   [provider]  furosemide (LASIX) 20 MG tablet Take 1 tablet (20 mg total) by mouth daily. 08/15/20 08/10/21  Marrianne Mood D, PA-C  gabapentin (NEURONTIN) 600  MG tablet Take 600 mg by mouth 2 (two) times daily.  11/20/19   [provider]  hydrOXYzine (VISTARIL) 50 MG capsule Take 50 mg by mouth every 6 (six) hours as needed for anxiety. 03/14/20   [provider]  ipratropium-albuterol (DUONEB) 0.5-2.5 (3) MG/3ML SOLN Take 3 mLs by nebulization 4 (four) times daily. 08/07/20   [provider]  irbesartan (AVAPRO) 300 MG tablet Take 0.5 tablets (150 mg total) by mouth daily. 06/30/20 06/25/21  Loel Dubonnet, NP  levothyroxine (SYNTHROID, LEVOTHROID) 75 MCG tablet Take 75 mcg by mouth daily before breakfast.    [provider]  lidocaine (LIDODERM) 5 % SMARTSIG:Patch(s) Topical 05/28/20   [provider]  Melatonin 10 MG TABS Take 10 mg by mouth at bedtime as needed (sleep).    [provider]  metFORMIN (GLUCOPHAGE) 500 MG tablet Take 500 mg by mouth 2 (two) times daily. 09/19/19   [provider]  MOVANTIK 25 MG TABS tablet Take 25 mg by mouth daily. 07/21/20   [provider]  nicotine (NICODERM CQ - DOSED IN MG/24 HOURS) 21 mg/24hr patch Place 1 patch (21 mg total) onto the skin daily. 08/15/20   Marrianne Mood D, PA-C  oxyCODONE-acetaminophen (PERCOCET) 5-325 MG tablet Take 1 tablet by mouth every 6 (six) hours as needed for severe pain. 09/12/20   Johnn Hai, PA-C  pantoprazole (PROTONIX) 40 MG tablet Take 40 mg by mouth 2 (two) times daily. 08/13/20   [provider]  potassium chloride SA (KLOR-CON) 20 MEQ tablet Take 1 tablet (20 mEq total) by mouth 2 (two) times daily. For one day. 06/18/20   Marrianne Mood D, PA-C  sitaGLIPtin (JANUVIA) 100 MG tablet Take 100 mg by mouth daily.    [provider]  Tiotropium Bromide Monohydrate 2.5 MCG/ACT AERS Inhale 2 puffs into the lungs daily.     [provider]  Vitamin D, Ergocalciferol, (DRISDOL) 1.25 MG (50000 UNIT) CAPS capsule Take 1 capsule by mouth once a week. 08/14/20   [provider]     Physical Exam: Vitals:   09/25/20 2201 09/25/20 2219 09/25/20 2230 09/25/20 2300  BP:   (!) 158/73 138/72  Pulse:   64 (!) 58  Resp:   (!) 27 16  Temp: 98.9 F (37.2 C)     TempSrc: Axillary     SpO2:   96% 98%  Weight:  122.7 kg    Height:  5\' 2"  (1.575 m)       Vitals:   09/25/20 2201 09/25/20 2219 09/25/20 2230 09/25/20 2300  BP:   (!) 158/73 138/72  Pulse:   64 (!) 58  Resp:   (!) 27 16  Temp: 98.9 F (37.2 C)     TempSrc: Axillary     SpO2:   96% 98%  Weight:  122.7 kg    Height:  5\' 2"  (1.575 m)        Constitutional: Obtunded, arousing only to vigorous shaking . Bipap on HEENT:      Head: Normocephalic and atraumatic.         Eyes: PERLA, EOMI, Conjunctivae are normal. Sclera is non-icteric.       Mouth/Throat: Mucous membranes are moist.       Neck: Supple with no signs of meningismus. Cardiovascular: Bradycardic. No murmurs, gallops, or rubs. 2+ symmetrical distal pulses are present . No JVD.  3+LE edema Respiratory: Respiratory effort increased .Lungs sounds diminishedbilaterally. Bibasilar rales Gastrointestinal: Soft,, and non distended with positive bowel sounds.  Genitourinary: No CVA tenderness. Musculoskeletal: Sling on right arm. No cyanosis, or erythema of extremities. Neurologic:  Face is symmetric. Moving all extremities. No gross focal neurologic deficits . Skin: Skin is warm, dry.  No rash or ulcers Psychiatric: Unable to assess due to altered mental status   Labs on Admission: I have personally reviewed following labs and imaging studies  CBC: Recent Labs  Lab 09/25/20 2158  WBC 13.8*  NEUTROABS 12.2*  HGB 10.8*  HCT 34.0*  MCV 92.4  PLT 366   Basic Metabolic Panel: Recent Labs  Lab 09/25/20 2158  NA 126*  K 5.9*  CL 86*  CO2 31  GLUCOSE 155*  BUN 58*  CREATININE 2.16*  CALCIUM 8.0*   GFR: Estimated Creatinine Clearance: 31.1 mL/min (A) (by C-G formula based  on SCr of 2.16 mg/dL (H)). Liver Function Tests: Recent  Labs  Lab 09/25/20 2158  AST 32  ALT 24  ALKPHOS 42  BILITOT 0.5  PROT 6.9  ALBUMIN 3.0*   No results for input(s): LIPASE, AMYLASE in the last 168 hours. No results for input(s): AMMONIA in the last 168 hours. Coagulation Profile: No results for input(s): INR, PROTIME in the last 168 hours. Cardiac Enzymes: No results for input(s): CKTOTAL, CKMB, CKMBINDEX, TROPONINI in the last 168 hours. BNP (last 3 results) No results for input(s): PROBNP in the last 8760 hours. HbA1C: No results for input(s): HGBA1C in the last 72 hours. CBG: No results for input(s): GLUCAP in the last 168 hours. Lipid Profile: No results for input(s): CHOL, HDL, LDLCALC, TRIG, CHOLHDL, LDLDIRECT in the last 72 hours. Thyroid Function Tests: No results for input(s): TSH, T4TOTAL, FREET4, T3FREE, THYROIDAB in the last 72 hours. Anemia Panel: No results for input(s): VITAMINB12, FOLATE, FERRITIN, TIBC, IRON, RETICCTPCT in the last 72 hours. Urine analysis:    Component Value Date/Time   COLORURINE YELLOW (A) 05/26/2020 1001   APPEARANCEUR CLEAR (A) 05/26/2020 1001   LABSPEC 1.017 05/26/2020 1001   PHURINE 5.0 05/26/2020 1001   GLUCOSEU NEGATIVE 05/26/2020 1001   HGBUR NEGATIVE 05/26/2020 1001   BILIRUBINUR NEGATIVE 05/26/2020 1001   [place] 05/26/2020 1001   PROTEINUR 30 (A) 05/26/2020 1001   NITRITE NEGATIVE 05/26/2020 1001   [place] 05/26/2020 1001    Radiological Exams on Admission: DG Chest Portable 1 View  Result Date: 09/25/2020 CLINICAL DATA:  Shortness of breath. EXAM: PORTABLE CHEST 1 VIEW COMPARISON:  Chest radiograph 05/29/2020 FINDINGS: Cardiomegaly with equivocal progression. Increasing peribronchial and interstitial thickening. No pneumothorax or large pleural effusion. Previous nodule in the superior segment of the right lower lobe is not well seen on the current exam. Right proximal humerus fracture, seen on recent humerus radiographs. IMPRESSION: Cardiomegaly  with equivocal progression. Increasing peribronchial and interstitial thickening may be pulmonary edema or infection. Overall findings suggest CHF. Electronically Signed   By: Keith Rake M.D.   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    Acute on chronic respiratory failure with hypoxia and hypercapnia -Multifactorial related to CHF and COPD exacerbation, untreated OSA with some possible contribution from recent opiate use as well as underlying lung cancer - Acute PE among differentials with patient is on chronic anticoagulation with apixaban - VBG with PCO2 of 78 and O2 sat was 88% on 2 L on arrival of EMS - Continue BiPAP and wean as tolerated to O2 via nasal cannula.  Patient is chronically on 2 to 3 L - Follow lower extremity Doppler.  Patient unable to go to CT scanner until more stable    Acute on chronic heart failure with preserved ejection fraction - Patient with respiratory distress and hypoxia and hypercapnia, chest x-ray consistent with CHF.  BNP pending - Continue IV Lasix. - Hold carvedilol due to bradycardia of 40 on arrival of EMS requiring atropine and junctional rhythm on EKG - Holding  ACE/ARB due to hyperkalemia and AKI - Daily weights, intake and output monitoring - Echocardiogram  Acute metabolic encephalopathy - Patient was confused and lethargic and difficult to arouse with EMS improving with BiPAP but again became difficult to arouse.  Physical exam nonfocal - Likely  related to CO2 narcosis given PCO2 of 78 - Fall and aspiration precautions - Neurocheck q 1 --Keep NPO for tonight    COPD with acute exacerbation ([place]) - Scheduled and as needed nebulized bronchodilator treatments - IV steroids, antitussives - BiPAP to wean to O2 via nasal cannula    Bradycardia, possibly symptomatic History of a flutter status post chemical cardioversion 05/2020 - Bradycardia in the 40s with EMS improving with atropine now in junctional bradycardia at 60 on arrival - Suspect related to respiratory failure - Hold carvedilol and amiodarone.  Pacer pads nearby - Continue apixaban - Continuous cardiac monitoring - Cardiology consult  AKI  Hyperkalemia - Creatinine 2.16 up from baseline of 1.16 - Potassium 5.9 - Hold ARB and monitor renal function - repeat K+ and kayexalate enema if still elevated as patient is obtunded  Right humeral fracture 4/26 -Pain control, cautious opioid use -Arm currently in sling    Type 2 diabetes mellitus without complication ([facility]) - Sliding scale insulin coverage for now    Essential hypertension - Hold irbesartan due to hyperkalemia.  Holding Coreg due to bradycardia    Hypothyroidism - Continue levothyroxine when oral resumed    Aortic stenosis-moderate to severe 03/2020 - Follow echocardiogram - We will avoid aggressive diuretic therapy  Nonobstructive CAD - Continue atorvastatin when oral route resumed.  Holding carvedilol. - No acute ST-T wave changes on EKG and patient denies chest pain.  Troponin normal at 12.  RLL squamous cell lung cancer status post RT - Recently diagnosed, received radiation therapy in March 2022     Obesity, Class III, BMI 40-49.9 (morbid obesity) ([place]) - Complicating factor to overall prognosis and care    OSA (obstructive sleep apnea) - Patient is not on CPAP at home  Patient is critically ill with multiple acute medical problems and her prognosis is guarded. Discussed with granddaughter  at bedside. Intensivist consult request.    DVT prophylaxis: lovenox full dose to replace home apixaban until oral route resumed Code Status: full code  Family Communication:  granddaughter  Disposition Plan: Back to previous home environment Consults called: Cardiology Status:At the time of admission, it appears that the appropriate admission status for this patient is INPATIENT. This is judged to be reasonable and necessary in order to provide the required intensity of service to ensure the patient's safety given the presenting symptoms, physical exam findings, and initial radiographic and laboratory data in the context of their  Comorbid conditions.   Patient requires inpatient status due to high intensity of service, high risk for further deterioration and high frequency of surveillance required.   I certify that at the point of admission it is my clinical judgment that the patient will require inpatient hospital care spanning beyond [place] MD [facility]     09/25/2020, 11:48 PM

## 2020-09-25 NOTE — H&P (Incomplete)
History and Physical    Kristin [name] SEG:[id number] DOB: 10-21-1950 DOA: 09/25/2020  PCP: [place]   Patient coming from: Home  I have personally briefly reviewed patient's old medical records in [place]  Chief Complaint: Respiratory distress  HPI: Kristin [name] is a 70 y.o. 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  On arrival of EMS O2 sat was 88% on 2 L with heart rate in the 40s improving with atropine.  She was placed on CPAP ED course: On arrival, she was a bit more responsive, tachypneic at 25 and was transitioned to BiPAP.  Afebrile with BP 122/59 pulse of 68.  Venous blood gas on BiPAP FiO2 0.4 with PCO2 of 78 and pH 7.26.  CBC with leukocytosis of 13,800 and hemoglobin of 10.8.  Other labs significant for creatinine of 2.16, up from baseline of 1.16 with potassium of 5.9, sodium 126.  Troponin normal at 12.  BNP pending.  COVID and flu negative EKG, personally viewed and interpreted junctional rhythm at 60 with no acute ST-T wave changes Imaging: Chest x-ray: Cardiomegaly with equivocal progression.  Increasing peribronchial and interstitial thickening may be pulmonary edema or infection.  Overall findings suggest CHF  Patient treated with IV Lasix as well as DuoNeb and Solu-Medrol.  She also received a dose of Narcan without improvement.  She was continued on BiPAP and slowly showed improving alertness.   Hospitalist consulted for admission.  Review of Systems: As per HPI otherwise all other systems on review of systems negative. ***   Past Medical History:  Diagnosis Date  . Anxiety   . Asthma   . CHF (congestive heart failure) ([place])    2018  . COPD (chronic obstructive pulmonary disease) ([place])   . Diabetes mellitus without complication ([place])   . Dyspnea   . GERD (gastroesophageal reflux disease)   . History of hiatal hernia   . History of orthopnea   . History of radiation therapy 07/29/20-08/08/20   Right Lung, SBRT Dr. Gery Pray  . Hypothyroidism   . Neuropathy   . Oxygen deficiency    3L/HS  . Pain    BACK/DDD  . Peripheral vascular disease ([place])   . RLS (restless legs syndrome)   . Sleep apnea   . Wheezing     Past Surgical History:  Procedure Laterality Date  . BREAST SURGERY    . CATARACT EXTRACTION W/PHACO Right 03/02/2018   Procedure: CATARACT EXTRACTION PHACO AND INTRAOCULAR LENS PLACEMENT ([place]);  Surgeon: Marchia Meiers, MD;  Location: [facility] ORS;  Service: Ophthalmology;  Laterality: Right;  [place] 01:15 CDE 16.46 Fluid pack lot # [id number]  . CATARACT EXTRACTION W/PHACO Left 04/27/2018   Procedure: CATARACT EXTRACTION PHACO AND INTRAOCULAR LENS PLACEMENT ([place])- LEFT DIABETIC;  Surgeon: Marchia Meiers, MD;  Location: [facility] ORS;  Service: Ophthalmology;  Laterality: Left;  Lot # [id number] H [place]: 00:46.3 CDE: 8.07   . CYST EXCISION     FOREHEAD  . FOOT SURGERY  CYST  . RIGHT/LEFT HEART CATH AND CORONARY ANGIOGRAPHY N/A 03/31/2020   Procedure: RIGHT/LEFT HEART CATH AND CORONARY ANGIOGRAPHY;  Surgeon: Wellington Hampshire, MD;  Location: [place];  Service: Cardiovascular;  Laterality: N/A;  . TUBAL LIGATION       reports that she has been smoking cigarettes. She has a 50.00 pack-year smoking history. She has never used smokeless tobacco. She reports previous alcohol use. No history on file for drug use.  Allergies  Allergen Reactions  . Altace  [Ramipril] Swelling    Family History  Problem Relation Age of Onset  . Heart disease Mother   . Cancer Father    ***   Prior to Admission medications   Medication Sig Start Date End Date Taking? Authorizing Provider  albuterol (VENTOLIN HFA) 108 (90 Base) MCG/ACT inhaler Inhale 2 puffs into the lungs every 4 (four) hours as needed for wheezing or shortness of breath.     [provider]  amiodarone (PACERONE) 200 MG tablet Take 1 tablet (200 mg total) by mouth daily. 06/13/20   Dunn, Areta Haber, PA-C  apixaban (ELIQUIS) 5 MG TABS tablet Take 1 tablet (5 mg total) by mouth 2 (two) times daily. 05/29/20   Dunn, Areta Haber, PA-C  atorvastatin (LIPITOR) 80 MG tablet Take 80 mg by mouth at bedtime.  09/05/19   [provider]  carvedilol (COREG) 3.125 MG tablet Take 3.125 mg by mouth 2 (two) times daily. 06/14/20   [provider]  citalopram (CELEXA) 40 MG tablet Take 40 mg by mouth daily. 06/14/20   [provider]  [facility] ASPIRIN ADULT LOW DOSE 81 MG chewable tablet Chew 81 mg by mouth daily. Patient not taking: Reported on 09/18/2020 03/19/20   [provider]  docusate sodium (COLACE) 100 MG capsule Take 100 mg by mouth at bedtime as needed. 03/19/20   [provider]  esomeprazole (NEXIUM) 40 MG capsule Take 40 mg by mouth daily. 10/01/19   [provider]  ezetimibe (ZETIA) 10 MG tablet Take 1 tablet (10 mg total) by mouth daily. 06/18/20 06/13/21  Marrianne Mood D, PA-C  famotidine (PEPCID) 40 MG tablet Take 40 mg by mouth daily.    [provider]  fenofibrate 160 MG tablet Take 160 mg by mouth daily.    [provider]  ferrous sulfate 325 (65 FE) MG tablet Take 325 mg by mouth daily. 06/14/20   [provider]  furosemide (LASIX) 20 MG tablet Take 1 tablet (20 mg total) by mouth daily. 08/15/20 08/10/21  Marrianne Mood D, PA-C  gabapentin (NEURONTIN) 600 MG tablet Take 600 mg by mouth 2 (two) times daily.  11/20/19    [provider]  hydrOXYzine (VISTARIL) 50 MG capsule Take 50 mg by mouth every 6 (six) hours as needed for anxiety. 03/14/20   [provider]  ipratropium-albuterol (DUONEB) 0.5-2.5 (3) MG/3ML SOLN Take 3 mLs by nebulization 4 (four) times daily. 08/07/20   [provider]  irbesartan (AVAPRO) 300 MG tablet Take 0.5 tablets (150 mg total) by mouth daily. 06/30/20 06/25/21  Loel Dubonnet, NP  levothyroxine (SYNTHROID, LEVOTHROID) 75 MCG tablet Take 75 mcg by mouth daily before breakfast.    [provider]  lidocaine (LIDODERM) 5 % SMARTSIG:Patch(s) Topical 05/28/20   [provider]  Melatonin 10 MG TABS Take 10 mg by mouth at bedtime as needed (sleep).    [provider]  metFORMIN (GLUCOPHAGE) 500 MG tablet Take 500 mg by mouth 2 (  two) times daily. 09/19/19   [provider]  MOVANTIK 25 MG TABS tablet Take 25 mg by mouth daily. 07/21/20   [provider]  nicotine (NICODERM CQ - DOSED IN MG/24 HOURS) 21 mg/24hr patch Place 1 patch (21 mg total) onto the skin daily. 08/15/20   Marrianne Mood D, PA-C  oxyCODONE-acetaminophen (PERCOCET) 5-325 MG tablet Take 1 tablet by mouth every 6 (six) hours as needed for severe pain. 09/12/20   Johnn Hai, PA-C  pantoprazole (PROTONIX) 40 MG tablet Take 40 mg by mouth 2 (two) times daily. 08/13/20   [provider]  potassium chloride SA (KLOR-CON) 20 MEQ tablet Take 1 tablet (20 mEq total) by mouth 2 (two) times daily. For one day. 06/18/20   Marrianne Mood D, PA-C  sitaGLIPtin (JANUVIA) 100 MG tablet Take 100 mg by mouth daily.    [provider]  Tiotropium Bromide Monohydrate 2.5 MCG/ACT AERS Inhale 2 puffs into the lungs daily.     [provider]  Vitamin D, Ergocalciferol, (DRISDOL) 1.25 MG (50000 UNIT) CAPS capsule Take 1 capsule by mouth once a week. 08/14/20   [provider]    Physical Exam: Vitals:   09/25/20 2201 09/25/20 2219 09/25/20  2230 09/25/20 2300  BP:   (!) 158/73 138/72  Pulse:   64 (!) 58  Resp:   (!) 27 16  Temp: 98.9 F (37.2 C)     TempSrc: Axillary     SpO2:   96% 98%  Weight:  122.7 kg    Height:  5\' 2"  (1.575 m)       Vitals:   09/25/20 2201 09/25/20 2219 09/25/20 2230 09/25/20 2300  BP:   (!) 158/73 138/72  Pulse:   64 (!) 58  Resp:   (!) 27 16  Temp: 98.9 F (37.2 C)     TempSrc: Axillary     SpO2:   96% 98%  Weight:  122.7 kg    Height:  5\' 2"  (1.575 m)        Constitutional: Alert*** and oriented x 3*** . Not in any apparent distress*** HEENT:      Head: Normocephalic and atraumatic.         Eyes: PERLA, EOMI, Conjunctivae are normal. Sclera is non-icteric.       Mouth/Throat: Mucous membranes are moist.       Neck: Supple with no signs of meningismus. Cardiovascular: Regular rate and rhythm***. No murmurs, gallops, or rubs. 2+ symmetrical distal pulses are present . No JVD. No ***LE edema Respiratory: Respiratory effort normal*** .Lungs sounds clear*** bilaterally. No*** wheezes, crackles, or rhonchi.  Gastrointestinal: Soft, non tender***, and non distended with positive bowel sounds.  Genitourinary: No CVA tenderness. Musculoskeletal: Nontender with normal range of motion in all extremities***. No cyanosis, or erythema of extremities. Neurologic:  Face is symmetric. Moving all extremities. No gross focal neurologic deficits ***. Skin: Skin is warm, dry.  No rash or ulcers*** Psychiatric: Mood and affect are normal***    Labs on Admission: I have personally reviewed following labs and imaging studies  CBC: Recent Labs  Lab 09/25/20 2158  WBC 13.8*  NEUTROABS 12.2*  HGB 10.8*  HCT 34.0*  MCV 92.4  PLT 027   Basic Metabolic Panel: Recent Labs  Lab 09/25/20 2158  NA 126*  K 5.9*  CL 86*  CO2 31  GLUCOSE 155*  BUN 58*  CREATININE 2.16*  CALCIUM 8.0*   GFR: Estimated Creatinine Clearance: 31.1 mL/min (A) (by C-G formula  based on SCr of 2.16 mg/dL (H)). Liver  Function Tests: Recent Labs  Lab 09/25/20 2158  AST 32  ALT 24  ALKPHOS 42  BILITOT 0.5  PROT 6.9  ALBUMIN 3.0*   No results for input(s): LIPASE, AMYLASE in the last 168 hours. No results for input(s): AMMONIA in the last 168 hours. Coagulation Profile: No results for input(s): INR, PROTIME in the last 168 hours. Cardiac Enzymes: No results for input(s): CKTOTAL, CKMB, CKMBINDEX, TROPONINI in the last 168 hours. BNP (last 3 results) No results for input(s): PROBNP in the last 8760 hours. HbA1C: No results for input(s): HGBA1C in the last 72 hours. CBG: No results for input(s): GLUCAP in the last 168 hours. Lipid Profile: No results for input(s): CHOL, HDL, LDLCALC, TRIG, CHOLHDL, LDLDIRECT in the last 72 hours. Thyroid Function Tests: No results for input(s): TSH, T4TOTAL, FREET4, T3FREE, THYROIDAB in the last 72 hours. Anemia Panel: No results for input(s): VITAMINB12, FOLATE, FERRITIN, TIBC, IRON, RETICCTPCT in the last 72 hours. Urine analysis:    Component Value Date/Time   COLORURINE YELLOW (A) 05/26/2020 1001   APPEARANCEUR CLEAR (A) 05/26/2020 1001   LABSPEC 1.017 05/26/2020 1001   PHURINE 5.0 05/26/2020 1001   GLUCOSEU NEGATIVE 05/26/2020 1001   HGBUR NEGATIVE 05/26/2020 1001   BILIRUBINUR NEGATIVE 05/26/2020 1001   [place] 05/26/2020 1001   PROTEINUR 30 (A) 05/26/2020 1001   NITRITE NEGATIVE 05/26/2020 1001   [place] 05/26/2020 1001    Radiological Exams on Admission: DG Chest Portable 1 View  Result Date: 09/25/2020 CLINICAL DATA:  Shortness of breath. EXAM: PORTABLE CHEST 1 VIEW COMPARISON:  Chest radiograph 05/29/2020 FINDINGS: Cardiomegaly with equivocal progression. Increasing peribronchial and interstitial thickening. No pneumothorax or large pleural effusion. Previous nodule in the superior segment of the right lower lobe is not well seen on the current exam. Right proximal humerus fracture, seen on recent humerus radiographs.  IMPRESSION: Cardiomegaly with equivocal progression. Increasing peribronchial and interstitial thickening may be pulmonary edema or infection. Overall findings suggest CHF. Electronically Signed   By: Keith Rake M.D.   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  On arrival of EMS O2 sat was 88% on 2 L with heart rate in the 40s improving with atropine.  She was placed on CPAP ED course: On arrival, she was a bit more responsive, tachypneic at 25 and was transitioned to BiPAP.  Afebrile with BP 122/59 pulse of 68.  Venous blood gas on BiPAP FiO2 0.4 with PCO2 of 78 and pH 7.26.  CBC with leukocytosis of 13,800 and hemoglobin of 10.8.  Other labs significant for creatinine of 2.16, up from baseline of 1.16 with potassium of 5.9, sodium 126.  Troponin normal at 12.  BNP pending.  COVID and flu negative EKG, personally viewed and interpreted junctional rhythm at 60 with no acute ST-T wave changes Imaging: Chest x-ray: Cardiomegaly with equivocal progression.  Increasing peribronchial and interstitial thickening may be pulmonary edema or infection.  Overall  findings suggest CHF  Patient treated with IV Lasix as well as DuoNeb and Solu-Medrol.  She also received a dose of Narcan without improvement.  She was continued on BiPAP and slowly showed improving alertness.  Hospitalist consulted for admission.    COPD with acute exacerbation (HCC)   Acute on chronic  heart failure with preserved ejection fraction (HFpEF) (HCC)   Acute on chronic respiratory failure with hypoxia and hypercapnia (HCC)   Bradycardia   Type 2 diabetes mellitus without complication (HCC)   Obesity, Class III, BMI 40-49.9 (morbid obesity) (HCC)   OSA (obstructive sleep apnea)   Essential hypertension   Hypothyroidism   Aortic stenosis-moderate to severe 03/2020    DVT prophylaxis: Lovenox***  Code Status: full code***  Family Communication:  none***  Disposition Plan: Back to previous home environment Consults called: none***  Status:***    Athena Masse MD [facility]     09/25/2020, 11:48 PM

## 2020-09-26 ENCOUNTER — Inpatient Hospital Stay (HOSPITAL_COMMUNITY)
Admit: 2020-09-26 | Discharge: 2020-09-26 | Disposition: A | Discharge status: Home / Self Care | Payer: 59 | Attending: Physician Assistant | Admitting: Physician Assistant

## 2020-09-26 ENCOUNTER — Inpatient Hospital Stay: Payer: 59

## 2020-09-26 ENCOUNTER — Other Ambulatory Visit: Payer: Self-pay

## 2020-09-26 DIAGNOSIS — J9622 Acute and chronic respiratory failure with hypercapnia: Secondary | ICD-10-CM

## 2020-09-26 DIAGNOSIS — I2510 Atherosclerotic heart disease of native coronary artery without angina pectoris: Secondary | ICD-10-CM

## 2020-09-26 DIAGNOSIS — I4891 Unspecified atrial fibrillation: Secondary | ICD-10-CM

## 2020-09-26 DIAGNOSIS — J9621 Acute and chronic respiratory failure with hypoxia: Secondary | ICD-10-CM | POA: Diagnosis not present

## 2020-09-26 DIAGNOSIS — Z515 Encounter for palliative care: Secondary | ICD-10-CM | POA: Diagnosis not present

## 2020-09-26 DIAGNOSIS — J441 Chronic obstructive pulmonary disease with (acute) exacerbation: Secondary | ICD-10-CM | POA: Diagnosis not present

## 2020-09-26 DIAGNOSIS — I5033 Acute on chronic diastolic (congestive) heart failure: Secondary | ICD-10-CM | POA: Diagnosis not present

## 2020-09-26 DIAGNOSIS — Z7189 Other specified counseling: Secondary | ICD-10-CM

## 2020-09-26 DIAGNOSIS — I5031 Acute diastolic (congestive) heart failure: Secondary | ICD-10-CM | POA: Diagnosis not present

## 2020-09-26 DIAGNOSIS — I350 Nonrheumatic aortic (valve) stenosis: Secondary | ICD-10-CM | POA: Diagnosis not present

## 2020-09-26 DIAGNOSIS — E875 Hyperkalemia: Secondary | ICD-10-CM

## 2020-09-26 DIAGNOSIS — J9601 Acute respiratory failure with hypoxia: Secondary | ICD-10-CM | POA: Diagnosis not present

## 2020-09-26 DIAGNOSIS — I509 Heart failure, unspecified: Secondary | ICD-10-CM

## 2020-09-26 LAB — BLOOD GAS, ARTERIAL
Acid-Base Excess: 6.2 mmol/L — ABNORMAL HIGH (ref 0.0–2.0)
Acid-Base Excess: 7.4 mmol/L — ABNORMAL HIGH (ref 0.0–2.0)
Bicarbonate: 34.6 mmol/L — ABNORMAL HIGH (ref 20.0–28.0)
Bicarbonate: 35.1 mmol/L — ABNORMAL HIGH (ref 20.0–28.0)
Delivery systems: POSITIVE
Expiratory PAP: 5
Expiratory PAP: 6
FIO2: 0.4
FIO2: 0.3
Inspiratory PAP: 12
Inspiratory PAP: 18
Mechanical Rate: 8
Mode: POSITIVE
O2 Saturation: 95.3 %
O2 Saturation: 91.8 %
Patient temperature: 37
Patient temperature: 37
RATE: 8 resp/min
pCO2 arterial: 72 mmHg (ref 32.0–48.0)
pCO2 arterial: 65 mmHg — ABNORMAL HIGH (ref 32.0–48.0)
pH, Arterial: 7.29 — ABNORMAL LOW (ref 7.350–7.450)
pH, Arterial: 7.34 — ABNORMAL LOW (ref 7.350–7.450)
pO2, Arterial: 86 mmHg (ref 83.0–108.0)
pO2, Arterial: 67 mmHg — ABNORMAL LOW (ref 83.0–108.0)

## 2020-09-26 LAB — URINE DRUG SCREEN, QUALITATIVE (ARMC ONLY)
Amphetamines, Ur Screen: NOT DETECTED
Barbiturates, Ur Screen: NOT DETECTED
Benzodiazepine, Ur Scrn: NOT DETECTED
Cannabinoid 50 Ng, Ur ~~LOC~~: NOT DETECTED
Cocaine Metabolite,Ur ~~LOC~~: NOT DETECTED
MDMA (Ecstasy)Ur Screen: NOT DETECTED
Methadone Scn, Ur: NOT DETECTED
Opiate, Ur Screen: NOT DETECTED
Phencyclidine (PCP) Ur S: NOT DETECTED
Tricyclic, Ur Screen: POSITIVE — AB

## 2020-09-26 LAB — ECHOCARDIOGRAM COMPLETE
Height: 62 in
S' Lateral: 2.73 cm
Weight: 4338.65 oz

## 2020-09-26 LAB — CBC
HCT: 31.9 % — ABNORMAL LOW (ref 36.0–46.0)
Hemoglobin: 10.5 g/dL — ABNORMAL LOW (ref 12.0–15.0)
MCH: 30.3 pg (ref 26.0–34.0)
MCHC: 32.9 g/dL (ref 30.0–36.0)
MCV: 91.9 fL (ref 80.0–100.0)
Platelets: 311 10*3/uL (ref 150–400)
RBC: 3.47 MIL/uL — ABNORMAL LOW (ref 3.87–5.11)
RDW: 14.6 % (ref 11.5–15.5)
WBC: 10.5 10*3/uL (ref 4.0–10.5)
nRBC: 0 % (ref 0.0–0.2)

## 2020-09-26 LAB — BASIC METABOLIC PANEL
Anion gap: 10 (ref 5–15)
Anion gap: 9 (ref 5–15)
BUN: 57 mg/dL — ABNORMAL HIGH (ref 8–23)
BUN: 56 mg/dL — ABNORMAL HIGH (ref 8–23)
CO2: 29 mmol/L (ref 22–32)
CO2: 30 mmol/L (ref 22–32)
Calcium: 7.9 mg/dL — ABNORMAL LOW (ref 8.9–10.3)
Calcium: 7.9 mg/dL — ABNORMAL LOW (ref 8.9–10.3)
Chloride: 87 mmol/L — ABNORMAL LOW (ref 98–111)
Chloride: 89 mmol/L — ABNORMAL LOW (ref 98–111)
Creatinine, Ser: 2.05 mg/dL — ABNORMAL HIGH (ref 0.44–1.00)
Creatinine, Ser: 1.98 mg/dL — ABNORMAL HIGH (ref 0.44–1.00)
GFR, Estimated: 26 mL/min — ABNORMAL LOW (ref >60)
GFR, Estimated: 27 mL/min — ABNORMAL LOW (ref >60)
Glucose, Bld: 169 mg/dL — ABNORMAL HIGH (ref 70–99)
Glucose, Bld: 195 mg/dL — ABNORMAL HIGH (ref 70–99)
Potassium: 5.8 mmol/L — ABNORMAL HIGH (ref 3.5–5.1)
Potassium: 5.1 mmol/L (ref 3.5–5.1)
Sodium: 126 mmol/L — ABNORMAL LOW (ref 135–145)
Sodium: 128 mmol/L — ABNORMAL LOW (ref 135–145)

## 2020-09-26 LAB — GLUCOSE, CAPILLARY
Glucose-Capillary: 174 mg/dL — ABNORMAL HIGH (ref 70–99)
Glucose-Capillary: 164 mg/dL — ABNORMAL HIGH (ref 70–99)
Glucose-Capillary: 163 mg/dL — ABNORMAL HIGH (ref 70–99)
Glucose-Capillary: 143 mg/dL — ABNORMAL HIGH (ref 70–99)
Glucose-Capillary: 118 mg/dL — ABNORMAL HIGH (ref 70–99)

## 2020-09-26 LAB — BRAIN NATRIURETIC PEPTIDE: B Natriuretic Peptide: 1972.2 pg/mL — ABNORMAL HIGH (ref 0.0–100.0)

## 2020-09-26 LAB — TROPONIN I (HIGH SENSITIVITY): Troponin I (High Sensitivity): 11 ng/L (ref <18)

## 2020-09-26 LAB — HEMOGLOBIN A1C
Hgb A1c MFr Bld: 6.2 % — ABNORMAL HIGH (ref 4.8–5.6)
Mean Plasma Glucose: 131.24 mg/dL

## 2020-09-26 LAB — MRSA PCR SCREENING: MRSA by PCR: NEGATIVE

## 2020-09-26 LAB — CBG MONITORING, ED: Glucose-Capillary: 162 mg/dL — ABNORMAL HIGH (ref 70–99)

## 2020-09-26 LAB — PROCALCITONIN: Procalcitonin: <0.1 ng/mL

## 2020-09-26 LAB — HIV ANTIBODY (ROUTINE TESTING W REFLEX): HIV Screen 4th Generation wRfx: NONREACTIVE

## 2020-09-26 LAB — MAGNESIUM: Magnesium: 1.9 mg/dL (ref 1.7–2.4)

## 2020-09-26 LAB — ETHANOL: Alcohol, Ethyl (B): <10 mg/dL (ref <10)

## 2020-09-26 MED ORDER — IPRATROPIUM-ALBUTEROL 0.5-2.5 (3) MG/3ML IN SOLN
3.0000 mL | RESPIRATORY_TRACT | Status: DC
Start: 2020-09-26 — End: 2020-10-03
  Administered 2020-09-26 – 2020-10-03 (×41): 3 mL via RESPIRATORY_TRACT
  Filled 2020-09-26 (×42): qty 3

## 2020-09-26 MED ORDER — POLYETHYLENE GLYCOL 3350 17 G PO PACK
17.0000 g | PACK | Freq: Every day | ORAL | Status: DC
  Administered 2020-09-27 – 2020-10-03 (×4): 17 g via ORAL
  Filled 2020-09-26 (×5): qty 1

## 2020-09-26 MED ORDER — FENTANYL 25 MCG/HR TD PT72
1.0000 | MEDICATED_PATCH | TRANSDERMAL | Status: DC
  Administered 2020-09-26: 1 via TRANSDERMAL
  Filled 2020-09-26: qty 1

## 2020-09-26 MED ORDER — BUDESONIDE 0.5 MG/2ML IN SUSP
0.5000 mg | Freq: Two times a day (BID) | RESPIRATORY_TRACT | Status: DC
  Administered 2020-09-26 – 2020-10-03 (×15): 0.5 mg via RESPIRATORY_TRACT
  Filled 2020-09-26 (×15): qty 2

## 2020-09-26 MED ORDER — IPRATROPIUM-ALBUTEROL 0.5-2.5 (3) MG/3ML IN SOLN
3.0000 mL | RESPIRATORY_TRACT | Status: DC
  Administered 2020-09-26 (×2): 3 mL via RESPIRATORY_TRACT
  Filled 2020-09-26 (×2): qty 3

## 2020-09-26 MED ORDER — METHYLPREDNISOLONE SODIUM SUCC 40 MG IJ SOLR
40.0000 mg | Freq: Two times a day (BID) | INTRAMUSCULAR | Status: AC
  Administered 2020-09-26: 40 mg via INTRAVENOUS
  Filled 2020-09-26: qty 1

## 2020-09-26 MED ORDER — DEXTROSE 50 % IV SOLN
12.5000 g | Freq: Once | INTRAVENOUS | Status: AC
  Administered 2020-09-26: 12.5 g via INTRAVENOUS
  Filled 2020-09-26: qty 50

## 2020-09-26 MED ORDER — ORAL CARE MOUTH RINSE
15.0000 mL | Freq: Two times a day (BID) | OROMUCOSAL | Status: DC
  Administered 2020-09-26 – 2020-09-29 (×7): 15 mL via OROMUCOSAL

## 2020-09-26 MED ORDER — METHYLPREDNISOLONE SODIUM SUCC 40 MG IJ SOLR
40.0000 mg | Freq: Four times a day (QID) | INTRAMUSCULAR | Status: DC
  Administered 2020-09-26 (×2): 40 mg via INTRAVENOUS
  Filled 2020-09-26 (×2): qty 1

## 2020-09-26 MED ORDER — DOCUSATE SODIUM 100 MG PO CAPS
100.0000 mg | ORAL_CAPSULE | Freq: Two times a day (BID) | ORAL | Status: DC
  Administered 2020-09-27 – 2020-10-02 (×9): 100 mg via ORAL
  Filled 2020-09-26 (×10): qty 1

## 2020-09-26 MED ORDER — SODIUM CHLORIDE 0.9 % IV SOLN
500.0000 mg | INTRAVENOUS | Status: DC
  Administered 2020-09-26: 500 mg via INTRAVENOUS
  Filled 2020-09-26 (×2): qty 500

## 2020-09-26 MED ORDER — INSULIN ASPART 100 UNIT/ML IV SOLN
10.0000 [IU] | Freq: Once | INTRAVENOUS | Status: AC
  Administered 2020-09-26: 10 [IU] via INTRAVENOUS
  Filled 2020-09-26: qty 0.1

## 2020-09-26 MED ORDER — SODIUM CHLORIDE 0.9 % IV SOLN
2.0000 g | INTRAVENOUS | Status: DC
  Administered 2020-09-26: 2 g via INTRAVENOUS
  Filled 2020-09-26: qty 2

## 2020-09-26 MED ORDER — IPRATROPIUM-ALBUTEROL 0.5-2.5 (3) MG/3ML IN SOLN
3.0000 mL | Freq: Four times a day (QID) | RESPIRATORY_TRACT | Status: DC
  Administered 2020-09-26: 3 mL via RESPIRATORY_TRACT
  Filled 2020-09-26 (×2): qty 3

## 2020-09-26 MED ORDER — CHLORHEXIDINE GLUCONATE 0.12 % MT SOLN
15.0000 mL | Freq: Two times a day (BID) | OROMUCOSAL | Status: DC
  Administered 2020-09-26 – 2020-10-02 (×9): 15 mL via OROMUCOSAL
  Filled 2020-09-26 (×9): qty 15

## 2020-09-26 MED ORDER — INSULIN ASPART 100 UNIT/ML IJ SOLN
0.0000 [IU] | Freq: Every day | INTRAMUSCULAR | Status: DC
  Administered 2020-09-28: 2 [IU] via SUBCUTANEOUS
  Filled 2020-09-26: qty 1

## 2020-09-26 MED ORDER — FUROSEMIDE 10 MG/ML IJ SOLN
40.0000 mg | Freq: Two times a day (BID) | INTRAMUSCULAR | Status: DC
  Administered 2020-09-26 – 2020-10-02 (×13): 40 mg via INTRAVENOUS
  Filled 2020-09-26 (×14): qty 4

## 2020-09-26 MED ORDER — TIOTROPIUM BROMIDE MONOHYDRATE 18 MCG IN CAPS
18.0000 ug | ORAL_CAPSULE | Freq: Every day | RESPIRATORY_TRACT | Status: DC
  Administered 2020-09-27 – 2020-10-03 (×7): 18 ug via RESPIRATORY_TRACT
  Filled 2020-09-26 (×2): qty 5

## 2020-09-26 MED ORDER — PATIROMER SORBITEX CALCIUM 8.4 G PO PACK
25.2000 g | PACK | Freq: Once | ORAL | Status: DC
  Filled 2020-09-26: qty 3

## 2020-09-26 MED ORDER — PREDNISONE 10 MG PO TABS: 40.0000 mg | ORAL_TABLET | Freq: Every day | ORAL | Status: DC

## 2020-09-26 MED ORDER — SODIUM CHLORIDE 0.9% FLUSH: 3.0000 mL | INTRAVENOUS | Status: DC | PRN

## 2020-09-26 MED ORDER — AMIODARONE HCL 200 MG PO TABS
200.0000 mg | ORAL_TABLET | Freq: Every day | ORAL | Status: DC
  Administered 2020-09-27: 200 mg via ORAL
  Filled 2020-09-26: qty 1

## 2020-09-26 MED ORDER — ALBUTEROL SULFATE (2.5 MG/3ML) 0.083% IN NEBU
2.5000 mg | INHALATION_SOLUTION | RESPIRATORY_TRACT | Status: DC | PRN
  Administered 2020-09-27: 2.5 mg via RESPIRATORY_TRACT
  Filled 2020-09-26: qty 3

## 2020-09-26 MED ORDER — PREDNISONE 10 MG PO TABS
40.0000 mg | ORAL_TABLET | Freq: Every day | ORAL | Status: DC
  Administered 2020-09-27: 40 mg via ORAL
  Filled 2020-09-26: qty 4

## 2020-09-26 MED ORDER — ENSURE ENLIVE PO LIQD
237.0000 mL | Freq: Three times a day (TID) | ORAL | Status: DC
  Administered 2020-09-26 – 2020-10-03 (×18): 237 mL via ORAL

## 2020-09-26 MED ORDER — INSULIN ASPART 100 UNIT/ML IJ SOLN
0.0000 [IU] | Freq: Three times a day (TID) | INTRAMUSCULAR | Status: DC
  Administered 2020-09-26 – 2020-09-27 (×5): 4 [IU] via SUBCUTANEOUS
  Administered 2020-09-27: 11 [IU] via SUBCUTANEOUS
  Administered 2020-09-28 (×2): 4 [IU] via SUBCUTANEOUS
  Administered 2020-09-29: 3 [IU] via SUBCUTANEOUS
  Administered 2020-09-29 – 2020-09-30 (×2): 4 [IU] via SUBCUTANEOUS
  Administered 2020-09-30: 7 [IU] via SUBCUTANEOUS
  Administered 2020-10-01: 15 [IU] via SUBCUTANEOUS
  Administered 2020-10-02: 3 [IU] via SUBCUTANEOUS
  Administered 2020-10-02: 7 [IU] via SUBCUTANEOUS
  Administered 2020-10-02: 11 [IU] via SUBCUTANEOUS
  Administered 2020-10-03: 4 [IU] via SUBCUTANEOUS
  Filled 2020-09-26 (×18): qty 1

## 2020-09-26 MED ORDER — IPRATROPIUM-ALBUTEROL 0.5-2.5 (3) MG/3ML IN SOLN: 3.0000 mL | Freq: Four times a day (QID) | RESPIRATORY_TRACT | Status: DC

## 2020-09-26 MED ORDER — SODIUM CHLORIDE 0.9 % IV SOLN
250.0000 mL | INTRAVENOUS | Status: DC | PRN
  Administered 2020-09-29 – 2020-09-30 (×2): 250 mL via INTRAVENOUS

## 2020-09-26 MED ORDER — HEPARIN SODIUM (PORCINE) 5000 UNIT/ML IJ SOLN
5000.0000 [IU] | Freq: Three times a day (TID) | INTRAMUSCULAR | Status: DC
  Filled 2020-09-26: qty 1

## 2020-09-26 MED ORDER — ACETAMINOPHEN 325 MG PO TABS
650.0000 mg | ORAL_TABLET | ORAL | Status: DC | PRN
  Administered 2020-10-02 – 2020-10-03 (×3): 650 mg via ORAL
  Filled 2020-09-26 (×3): qty 2

## 2020-09-26 MED ORDER — ONDANSETRON HCL 4 MG/2ML IJ SOLN
4.0000 mg | Freq: Four times a day (QID) | INTRAMUSCULAR | Status: DC | PRN
  Administered 2020-09-26 – 2020-09-27 (×2): 4 mg via INTRAVENOUS
  Filled 2020-09-26 (×2): qty 2

## 2020-09-26 MED ORDER — ENOXAPARIN SODIUM 150 MG/ML IJ SOSY
125.0000 mg | PREFILLED_SYRINGE | Freq: Two times a day (BID) | INTRAMUSCULAR | Status: DC
  Administered 2020-09-26 – 2020-09-28 (×5): 125 mg via SUBCUTANEOUS
  Filled 2020-09-26 (×6): qty 0.84

## 2020-09-26 MED ORDER — ADULT MULTIVITAMIN W/MINERALS CH
1.0000 | ORAL_TABLET | Freq: Every day | ORAL | Status: DC
  Administered 2020-09-27 – 2020-10-03 (×7): 1 via ORAL
  Filled 2020-09-26 (×7): qty 1

## 2020-09-26 MED ORDER — SODIUM CHLORIDE 0.9% FLUSH
3.0000 mL | Freq: Two times a day (BID) | INTRAVENOUS | Status: DC
  Administered 2020-09-26 – 2020-10-03 (×14): 3 mL via INTRAVENOUS

## 2020-09-26 MED ORDER — LEVOTHYROXINE SODIUM 50 MCG PO TABS
75.0000 ug | ORAL_TABLET | Freq: Every day | ORAL | Status: DC
  Administered 2020-09-27 – 2020-10-03 (×7): 75 ug via ORAL
  Filled 2020-09-26: qty 2
  Filled 2020-09-26: qty 1
  Filled 2020-09-26 (×5): qty 2

## 2020-09-26 MED ORDER — CHLORHEXIDINE GLUCONATE CLOTH 2 % EX PADS
6.0000 | MEDICATED_PAD | Freq: Every day | CUTANEOUS | Status: DC
  Administered 2020-09-26 – 2020-10-02 (×7): 6 via TOPICAL

## 2020-09-26 MED ORDER — FAMOTIDINE IN NACL 20-0.9 MG/50ML-% IV SOLN
20.0000 mg | INTRAVENOUS | Status: DC
  Administered 2020-09-26 – 2020-09-29 (×4): 20 mg via INTRAVENOUS
  Filled 2020-09-26 (×4): qty 50

## 2020-09-26 NOTE — Progress Notes (Signed)
*  PRELIMINARY RESULTS* Echocardiogram 2D Echocardiogram has been performed.  Sherrie Sport 09/26/2020, 1:15 PM

## 2020-09-26 NOTE — Consult Note (Addendum)
Cardiology Consultation:   Patient ID: Kristin [name] MRN: [id number]; DOB: July 02, 1950  Admit date: 09/25/2020 Date of Consult: 09/26/2020  Primary Care Provider: [place] Primary Cardiologist: [facility], Dr. Rockey Situ Primary Electrophysiologist:  None    Patient Profile:   Kristin [name] is a 70 y.o. 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 Damita Dunnings for [facility] HFpEF.  History of Present Illness:   Kristin [name] is a 70 y.o. female with PMH as above. She has a history of diastolic CHF after admission 07/04/2018 due to acute COPDand found to be volume up with successful IV diuresis.Seen 03/2020 in [facility] ED for NSTEMI. She reported increasing volume status and CP. Echo showed nl EF, mild LVH, G1DD, mild LAE, and moderate to severe AS. She underwent subsequentR/LHC. She was found to have moderate nonobstructive CADwith stenosis being 60% of pLAD. Cors were overall moderately calcified. Right heart cathshowed mildly elevatedL sidedfilling pressures, moderate PHTN, moderately reduced CO. Severeaortic stenosis notedwith mean gradient 51mHg and calcified valve 0.7cm2.   Outpatient TAVR evaluationperformed, as well as evaluation by CTS for replacement of her aortic valve, though replacement has been complicated by recent RLL SCC diagnosis as detailed below.TAVR workup withCardiac CTA noteda1.9x1.4x1.6cm mass in super segmet of RLL suspicious for lung CA.   Subsequent PET scan showed 2.1x1.4cm RLL nodule that was hypermetabolic and compatible with malignancy with no  appreciated nodal or mets.  She then underwentoutpatient MRI of the brainto ensure no METs to the brain from her lung cancer.While inthe MRI machinefor her MRI brain scan, she developed CP, abdominal pain, and emesis. She was in atrial flutter thenconverted toajunctional rhythmwith4.1 second termination pauses.BB discontinued. OPounddeferred 2/2 melena with recommendation foroutpatientmonitorto determine burden of her arrhythmia.   Onambulatorymonitoring, it was noted she hadrecurrent Afib. ASA discontinued and OAC with Eliquis594mBID started. She was continued onamiodarone.  Lung bx withSCC of theright lower lobe. MRI ofthebrainwas fortunatelywithout intracranial mets. CTS recommendation was forworkup/txof SCCper oncology.  Message was sent to structural heart clinic regarding her valve with confirmation that further work-up of her valve would be deferred for now and while she followed up with oncology.  Recommendation was to order echo 09/2020 and then see Dr. McAngelena Formn follow-up to discuss.AS felt to be moderate by the team.  Seen 06/2020 and with Cr bump, lasix changed down to Lasix 20 mg every other day.  Cr improved.  Seen 08/2020 and reported 5-6 pillows due to her orthopnea.  She was smoking 1 pack/day. [name] 108.4 kg. She was on oxygen. Lasix increased to lasix 2048maily. She was undergoing SBRT tx for lung CA.  Admitted after presenting to the emergency department 09/25/2020 with acute respiratory distress.  Per EMS, she developed difficulty breathing a couple of hours prior to arrival and became increasingly confused during that time.  She was placed on 2 L nasal cannula due to oxygen saturations 88% on her usual 2 L [place] O2.  She was then placed on CPAP with somnolence noted during transport.  She was bradycardic with rates into the 40s.  She was given 1 mg of atropine with improvement in rate.  She reported shortness  of breath without chest pain.  In the  ED, initial vitals significant for BP 122/59, HR 63 bpm, 96% on BiPAP. It was noted that she had recently been seen in the ED 4/29 and found to have comminuted, impacted fx pf the proximal humerus with recommendation for shoulder immobilizer. She was prescribed percocet, which the nursing staff reports today she has been using very heavily at home but per initial family report, she was using as prescribed.  Per nursing staff, she was not using a CPAP at night but only O2 but unable to confirm during consultation today due to pt somnolence.  Initial labs show creatinine 2.16, BUN 58, potassium 5.9, sodium 126, glucose 155, AST 32, ALT 24, albumin 3.0, alk phos 42, BNP 1972.2, high-sensitivity troponin 12, 11 chest.  Hemoglobin 10.8, hematocrit 34.0, COVID /[place] panel negative, tox screen positive for tricyclic's. CXR concerning for edema versus pna. She received IV lasix 74m x1 and has been maintained on IV lasix 465mBID since that time.Initial EKG junctional rhythm at 60bpm and poor R wave progression V1-III. Subsequent EKG SVT at 124bpm and ST/T changes noted in the inferior leads versus artifact / baseline wander.  As above, HPI today obtained via chart bx as unable to obtain ROS from pt due to level of somnolence today.  Heart Pathway Score:     Past Medical History:  Diagnosis Date  . Anxiety   . Asthma   . CHF (congestive heart failure) (HCCharl[place]   2018  . COPD (chronic obstructive pulmonary disease) (HCProte[place]  . Diabetes mellitus without complication (HCCrab [place]  . Dyspnea   . GERD (gastroesophageal reflux disease)   . History of hiatal hernia   . History of orthopnea   . History of radiation therapy 07/29/20-08/08/20   Right Lung, SBRT Dr. JaGery Pray. Hypothyroidism   . Neuropathy   . Oxygen deficiency    3L/HS  . Pain    BACK/DDD  . Peripheral vascular disease (HCCom[place]  . RLS (restless legs syndrome)   . Sleep apnea   . Wheezing     Past Surgical History:  Procedure Laterality  Date  . BREAST SURGERY    . CATARACT EXTRACTION W/PHACO Right 03/02/2018   Procedure: CATARACT EXTRACTION PHACO AND INTRAOCULAR LENS PLACEMENT (IOEast [place]  Surgeon: HaMarchia MeiersMD;  Location: [facility] ORS;  Service: Ophthalmology;  Laterality: Right;  [place]1:15 CDE 16.46 Fluid pack lot # [id number]  . CATARACT EXTRACTION W/PHACO Left 04/27/2018   Procedure: CATARACT EXTRACTION PHACO AND INTRAOCULAR LENS PLACEMENT (IORed[place] LEFT DIABETIC;  Surgeon: HaMarchia MeiersMD;  Location: [facility] ORS;  Service: Ophthalmology;  Laterality: Left;  Lot # [id number] [place]00:46.3 CDE: 8.07   . CYST EXCISION     FOREHEAD  . FOOT SURGERY     CYST  . RIGHT/LEFT HEART CATH AND CORONARY ANGIOGRAPHY N/A 03/31/2020   Procedure: RIGHT/LEFT HEART CATH AND CORONARY ANGIOGRAPHY;  Surgeon: ArWellington HampshireMD;  [facility];  Service: Cardiovascular;  Laterality: N/A;  . TUBAL LIGATION       Home Medications:  Prior to Admission medications   Medication Sig Start Date Chemika [place] Date Taking? Authorizing Provider  albuterol (PROAIR HFA) 108 (90 Base) MCG/ACT inhaler Inhale 2 puffs into the lungs every 4 (four) hours as needed for wheezing or shortness of breath.    Yes [provider]  aspirin EC 81 MG tablet Take 81 mg by mouth daily.   Yes [provider]  atorvastatin (  LIPITOR) 80 MG tablet Take 80 mg by mouth at bedtime.  09/05/19  Yes [provider]  carvedilol (COREG) 3.125 MG tablet Take 1 tablet (3.125 mg total) by mouth 2 (two) times daily with a meal. 07/07/18  Yes Mayo, Pete Pelt, MD  citalopram (CELEXA) 40 MG tablet Take 40 mg by mouth daily.   Yes [provider]  diclofenac Sodium (VOLTAREN) 1 % GEL Apply 1 application topically 4 (four) times daily.  03/27/20  Yes [provider]  esomeprazole (NEXIUM) 40 MG capsule Take 40 mg by mouth daily. 10/01/19  Yes [provider]  fenofibrate 160 MG tablet Take 160 mg by mouth daily.   Yes [provider]  furosemide (LASIX) 40 MG tablet Take 40 mg by mouth daily. 03/19/20  Yes [provider]  gabapentin (NEURONTIN) 600 MG tablet Take 600 mg by mouth 2 (two) times daily.  11/20/19  Yes [provider]  hydrOXYzine (VISTARIL) 50 MG capsule Take 50 mg by mouth every 6 (six) hours as needed for anxiety. 03/14/20  Yes [provider]  levothyroxine (SYNTHROID, LEVOTHROID) 75 MCG tablet Take 75 mcg by mouth daily before breakfast.   Yes [provider]  lisinopril (PRINIVIL,ZESTRIL) 20 MG tablet Take 20 mg by mouth daily.   Yes [provider]  metFORMIN (GLUCOPHAGE) 500 MG tablet Take 500 mg by mouth 2 (two) times daily. 09/19/19  Yes [provider]  MOVANTIK 12.5 MG TABS tablet Take 12.5 mg by mouth daily.  09/24/19  Yes [provider]  oxyCODONE-acetaminophen (PERCOCET/ROXICET) 5-325 MG tablet Take 1 tablet by mouth 3 (three) times daily.   Yes [provider]  OXYGEN Inhale 3 L into the lungs at bedtime.    Yes [provider]  sitaGLIPtin (JANUVIA) 100 MG tablet Take 100 mg by mouth daily.   Yes [provider]  Tiotropium Bromide Monohydrate (SPIRIVA RESPIMAT) 2.5 MCG/ACT AERS Inhale 2 puffs into the lungs daily.    Yes [provider]  ACCU-CHEK GUIDE test strip  09/25/19   [provider]    Inpatient Medications: Scheduled Meds: . budesonide (PULMICORT) nebulizer solution  0.5 mg Nebulization BID  . chlorhexidine  15 mL Mouth Rinse BID  . enoxaparin (LOVENOX) injection  125 mg Subcutaneous Q12H  . furosemide  40 mg Intravenous BID  . insulin aspart  0-20 Units Subcutaneous TID WC  . insulin aspart  0-5 Units Subcutaneous QHS  . ipratropium-albuterol  3 mL Nebulization Q4H  . mouth rinse  15 mL Mouth Rinse q12n4p  . methylPREDNISolone (SOLU-MEDROL) injection  40 mg Intravenous Q6H   Followed by  . [START ON 09/27/2020] predniSONE  40 mg Oral Q breakfast  . sodium chloride flush  3  mL Intravenous Q12H   Continuous Infusions: . sodium chloride    . azithromycin Stopped (09/26/20 0447)  . famotidine (PEPCID) IV Stopped (09/26/20 0404)   PRN Meds: sodium chloride, acetaminophen, albuterol, ondansetron (ZOFRAN) IV, sodium chloride flush  Allergies:    Allergies  Allergen Reactions  . Altace [Ramipril] Swelling    Social History:   Social History   Socioeconomic History  . Marital status: Widowed    Spouse name: Not on file  . Number of children: 4  . Years of education: Not on file  . Highest education level: Not on file  Occupational History  . Occupation: Health and safety inspector  Tobacco Use  . Smoking status: Current Every Day Smoker    Packs/day: 1.00  Years: 50.00    Pack years: 50.00    Types: Cigarettes  . Smokeless tobacco: Never Used  Vaping Use  . Vaping Use: Never used  Substance and Sexual Activity  . Alcohol use: Not Currently  . Drug use: Not on file  . Sexual activity: Not on file  Other Topics Concern  . Not on file  Social History Narrative  . Not on file   Social Determinants of Health   Financial Resource Strain: Not on file  Food Insecurity: Not on file  Transportation Needs: Not on file  Physical Activity: Not on file  Stress: Not on file  Social Connections: Not on file  Intimate Partner Violence: Not on file    Family History:   No reported family history of heart dz.   Family History  Problem Relation Age of Onset  . Heart disease Mother   . Cancer Father      ROS:  Please see the history of present illness.  Review of Systems  Unable to perform ROS: Acuity of condition  All other systems reviewed and are negative.   All other ROS reviewed and negative.     Physical Exam/Data:   Vitals:   09/26/20 0600 09/26/20 0645 09/26/20 0700 09/26/20 0833  BP: (!) 119/56 (!) 119/55    Pulse: 67 72    Resp: 15 15    Temp:      TempSrc:      SpO2: 92% 92% 94% 93%  [name]:      Height:        Intake/Output  Summary (Last 24 hours) at 09/26/2020 0902 Last data filed at 09/26/2020 0500 Gross per 24 hour  Intake 400.16 ml  Output 1600 ml  Net -1199.84 ml   Last 3 Weights 09/26/2020 09/25/2020 09/12/2020  [name] (lbs) 271 lb 2.7 oz 270 lb 8.1 oz 230 lb  [name] (kg) 123 kg 122.7 kg 104.327 kg     Body mass index is 49.6 kg/m.  General: Obese female, somnolent on exam, BiPAP in place HEENT: normal Neck: JVD difficult to assess due to body habitus Vascular: No carotid bruits; radial pulses 2+ bilaterally Cardiac: Distant heart sounds, normal S1, S2; RRR; RUSB 3/6 systolic murmur though difficult to hear due to BiPAP Lungs: Anterior auscultation only and not well heard due to being on the BiPAP, coarse breath sounds bilaterally Abd: obese, slightly distended, nontender, no hepatomegaly  Ext: no significant pitting edema Musculoskeletal:  No deformities Skin: warm and dry  Neuro:  Somnolent Psych: Somnolent  EKG:  The EKG was personally reviewed and demonstrates:   Initial EKG junctional rhythm at 60bpm and poor R wave progression V1-III. Subsequent EKG SVT at 124bpm and ST/T changes noted in the inferior leads versus artifact / baseline wander.  Telemetry:  Telemetry was personally reviewed and demonstrates:  SB, NSR with ectopy, junctional rhythm with post termination pauses up to 2.5 seconds to over 3.0 seconds though difficult to measure due to significant artifact, atrial fibrillation, rates 40s-120s  Relevant CV Studies:  05/2020 Cardiac monitor Normal sinus rhythm Patient had a min HR of 56 bpm, max HR of 229 bpm, and avg HR of 75 bpm. Atrial Fibrillation occurred (1% burden), ranging from 121-229 bpm (avg of 170 bpm), the longest lasting 1 hour 10 mins with an avg rate of 170 bpm.  Atrial Fibrillation was detected within +/- 45 seconds of symptomatic patient event(s). 9 Supraventricular Tachycardia runs occurred, the run with the fastest interval lasting 4 beats with  a max rate of 200  bpm, the longest lasting 9 beats with an avg rate of 96 bpm.  Isolated SVEs were rare (<1.0%), SVE Couplets were rare (<1.0%), and SVE Triplets were rare (<1.0%). Isolated VEs were rare (<1.0%), VE Couplets were rare (<1.0%), and no VE Triplets were present.   Carotids  03/2020 IMPRESSION: Color duplex indicates minimal heterogeneous and calcified plaque, with no hemodynamically significant stenosis by duplex criteria in the extracranial cerebrovascular circulation.  R/LHC 03/31/20  Prox RCA lesion is 30% stenosed.  Mid RCA lesion is 40% stenosed.  RPDA lesion is 30% stenosed.  Prox Cx lesion is 60% stenosed.  Mid LAD lesion is 20% stenosed.  Mid Cx to Dist Cx lesion is 20% stenosed. 1.  Mild to moderate nonobstructive coronary artery disease.  Worst stenosis is 60% in the proximal left circumflex.  No evidence of obstructive disease.  Coronary arteries are overall moderately calcified. 2.  Right heart catheterization showed moderately elevated left-sided filling pressures, moderate pulmonary hypertension and moderately reduced cardiac output. 3.  Severe aortic stenosis with mean gradient of 31 mmHg and calculated valve area of 0.7 cm. Recommendations: The patient is significantly volume overloaded.  I switched furosemide to intravenous 20 mg twice daily.  I increased carvedilol for better blood pressure control. Recommend medical therapy for nonobstructive coronary artery disease. Recommend outpatient TAVR evaluation. Coronary Diagrams   Diagnostic Dominance: Right         Echo 03/2020 1. Left ventricular ejection fraction, by estimation, is 55 to 60%. The  left ventricle has normal function. The left ventricle has no regional  wall motion abnormalities. There is mild left ventricular hypertrophy.  Left ventricular diastolic parameters  are consistent with Grade I diastolic dysfunction (impaired relaxation).  2. Right ventricular systolic function is normal.  The right ventricular  size is normal. Tricuspid regurgitation signal is inadequate for assessing  PA pressure.  3. Left atrial size was mildly dilated.  4. The aortic valve was not well visualized. Moderate to severe aortic  valve stenosis. Aortic valve area, by VTI measures 1.11 cm. Aortic valve  mean gradient measures 31.0 mmHg. Aortic valve Vmax measures 3.66 m/s.  Laboratory Data:  High Sensitivity Troponin:   Recent Labs  Lab 09/25/20 2158 09/25/20 2357  TROPONINIHS 12 11     Cardiac EnzymesNo results for input(s): TROPONINI in the last 168 hours. No results for input(s): TROPIPOC in the last 168 hours.  Chemistry Recent Labs  Lab 09/25/20 2158 09/26/20 0150 09/26/20 0425  NA 126* 126* 128*  K 5.9* 5.8* 5.1  CL 86* 87* 89*  CO2 _0 GLUCOSE 155* 169* 195*  BUN 58* 57* 56*  CREATININE 2.16* 2.05* 1.98*  CALCIUM 8.0* 7.9* 7.9*  GFRNONAA 24* 26* 27*  ANIONGAP _1 Recent Labs  Lab 09/25/20 2158  PROT 6.9  ALBUMIN 3.0*  AST 32  ALT 24  ALKPHOS 42  BILITOT 0.5   Hematology Recent Labs  Lab 09/25/20 2158 09/26/20 0425  WBC 13.8* 10.5  RBC 3.68* 3.47*  HGB 10.8* 10.5*  HCT 34.0* 31.9*  MCV 92.4 91.9  MCH 29.3 30.3  MCHC 31.8 32.9  RDW 14.6 14.6  PLT 352 311   BNP Recent Labs  Lab 09/25/20 2157  BNP 1,972.2*    DDimer No results for input(s): DDIMER in the last 168 hours.   Radiology/Studies:  CT HEAD WO CONTRAST  Result Date: 09/26/2020 CLINICAL DATA:  70 year old female with altered mental status.  Lung cancer. EXAM: CT HEAD WITHOUT CONTRAST TECHNIQUE: Contiguous axial images were obtained from the base of the skull through the vertex without intravenous contrast. COMPARISON:  Brain MRI dated 05/26/2020. FINDINGS: Evaluation of this exam is limited due to motion artifact. Brain: The ventricles and sulci are appropriate size for patient's age. Focal area of white matter hypodensity in the right corona radiata consistent with chronic  microvascular ischemic changes and seen on the prior MRI. There is no acute intracranial hemorrhage. No mass effect or midline shift. No extra-axial fluid collection. Vascular: No hyperdense vessel or unexpected calcification. Skull: Normal. Negative for fracture or focal lesion. Sinuses/Orbits: No acute finding. Other: None IMPRESSION: 1. No acute intracranial pathology. 2. Chronic microvascular ischemic changes. Electronically Signed   By: Anner Crete M.D.   On: 09/26/2020 03:06   US Venous Img Lower Bilateral (DVT)  Result Date: 09/26/2020 CLINICAL DATA:  Acute respiratory failure. Hypoxemia. Tobacco use. Malignancy of the lung. Hormone therapy and anticoagulation therapy. EXAM: BILATERAL LOWER EXTREMITY VENOUS DOPPLER ULTRASOUND TECHNIQUE: Gray-scale sonography with graded compression, as well as color Doppler and duplex ultrasound were performed to evaluate the lower extremity deep venous systems from the level of the common femoral vein and including the common femoral, femoral, profunda femoral, popliteal and calf veins including the posterior tibial, peroneal and gastrocnemius veins when visible. The superficial great saphenous vein was also interrogated. Spectral Doppler was utilized to evaluate flow at rest and with distal augmentation maneuvers in the common femoral, femoral and popliteal veins. COMPARISON:  None. FINDINGS: RIGHT LOWER EXTREMITY Common Femoral Vein: No evidence of thrombus. Normal compressibility, respiratory phasicity and response to augmentation. Saphenofemoral Junction: No evidence of thrombus. Normal compressibility and flow on color Doppler imaging. Profunda Femoral Vein: No evidence of thrombus. Normal compressibility and flow on color Doppler imaging. Femoral Vein: No evidence of thrombus. Normal compressibility, respiratory phasicity and response to augmentation. Popliteal Vein: No evidence of thrombus. Normal compressibility, respiratory phasicity and response to  augmentation. Calf Veins: No evidence of thrombus. Normal compressibility and flow on color Doppler imaging. Superficial Great Saphenous Vein: No evidence of thrombus. Normal compressibility. Venous Reflux:  None. Other Findings:  None. LEFT LOWER EXTREMITY Common Femoral Vein: No evidence of thrombus. Normal compressibility, respiratory phasicity and response to augmentation. Saphenofemoral Junction: No evidence of thrombus. Normal compressibility and flow on color Doppler imaging. Profunda Femoral Vein: No evidence of thrombus. Normal compressibility and flow on color Doppler imaging. Femoral Vein: No evidence of thrombus. Normal compressibility, respiratory phasicity and response to augmentation. Popliteal Vein: No evidence of thrombus. Normal compressibility, respiratory phasicity and response to augmentation. Calf Veins: No evidence of thrombus. Normal compressibility and flow on color Doppler imaging. Superficial Great Saphenous Vein: No evidence of thrombus. Normal compressibility. Venous Reflux:  None. Other Findings:  None. IMPRESSION: No evidence of deep venous thrombosis in either lower extremity. Electronically Signed   By: Iven Finn M.D.   On: 09/26/2020 06:39   DG Chest Portable 1 View  Result Date: 09/25/2020 CLINICAL DATA:  Shortness of breath. EXAM: PORTABLE CHEST 1 VIEW COMPARISON:  Chest radiograph 05/29/2020 FINDINGS: Cardiomegaly with equivocal progression. Increasing peribronchial and interstitial thickening. No pneumothorax or large pleural effusion. Previous nodule in the superior segment of the right lower lobe is not well seen on the current exam. Right proximal humerus fracture, seen on recent humerus radiographs. IMPRESSION: Cardiomegaly with equivocal progression. Increasing peribronchial and interstitial thickening may be pulmonary edema or infection. Overall findings suggest CHF. Electronically Signed   By:  Keith Rake M.D.   On: 09/25/2020 22:19    Assessment and  Plan:   Severe acute hypoxic and hypercapnic respiratory failure due to COPD exacerbation, exacerbation of diastolic heart failure, paroxysmal atrial fibrillation with RVR, SCC of RLL AOC Heart failure with preserved ejection fraction --SOB reported at presentation and likely multifactorial in setting of her RLL SCC (currently undergoing tx per oncology), COPD (on home O2), AOC HFpEF, moderate to severe AS, OSA (reportedly not wearing her CPAP but only her O2 at night), OHS. Suspect volume up on exam, though difficult to ascertain given body habitus. Lasix was recently increased back to 30m daily but has been decreased in the OP setting due to sub-optimal renal function with bumped Cr on higher doses of Lasix. --Echo to reassess for acute structural changes. --Monitor I/O  Net -1.2L yesterday --Daily wt.    Wt 270.5  271.17 lbs --Daily BMET.  Cr 2.16  1.98 (Baseline recently increased to 1.0-1.4)  K 5.1 --> monitor closely. --Continue IV lasix 457mBID and titrate as needed for net output 2L per day or until euvolemic on exam. As above, IV diuresis has been complicated by renal function; however, given her HFpEF and aortic valve, suspect that Cr will improve with diuresis. Monitor Cr closely.  PTA Lasix 20 mg daily. --Given bump in Cr, holding ARB.  --No AV nodal blocking agents, given post-termination pauses. No BB. --Further recommendations, if indicated, pending echo.  SVT / Atrial fibrillation ([place]) --Telemetry as above with NSR with ectopy, junctional rhythm, post termination pauses, atrial fibrillation, and short runs SVT. --Discussed with MD. For now, we will hold off on OAMontroseiven comorbid SCC. Recommend daily CBC.  Junctional rhythm Post termination pauses with recommendation against AV nodal blocking agents --As above, avoid AV nodal blocking agents.  Aortic stenosis, moderate --Previous echo as above with moderate stenosis of the aortic valve and heavy  calcification.  Seen by the TAVR team with recommendation to monitor and repeat echo around this time (09/2020), given preference was to treat SCC over TAVR replacement at that time, and aortic stenosis felt to be moderate by TAVR team [facility]. --Ordered echo to reassess valvular function and severity of stenosis as above. Further recommendations pending echo.  Hyperkalemia --Monitor BMET closely. --Suspect improvement with IV diuresis.   Shoulder   HTN --DBP borderline. --No BB due to post-termination pauses. No AV nodal blocking agents. --No ACE/ARB/ARNI due to Cr.   AOCKD --Caution with nephrotoxins --Monitor closely on IV diuresis.  DM2 --SSI, per IM.  Hypothyroidism --Per IM    For questions or updates, please contact [facility] consult [email or web address] for contact info under    Signed, JaArvil ChacoPA-C  09/26/2020 9:02 AM    I have independently seen and examined the patient and agree with the findings and plan, as documented in the PA/NP's note, with the following additions/changes.  Patient is a [age]ear old woman with history of chronic HFpEF, moderate-severe aortic stenosis, paroxysmal atrial fibrillation complicated by bradycardia with posttermination pauses, hypertension, recently diagnosed small cell lung cancer of the right lower lobe, chronic respiratory failure with hypoxia, COPD, sleep apnea, type 2 diabetes mellitus, and ongoing tobacco use, whom we have been asked to see due to bradycardia.  The patient is currently on BiPAP and minimally responsive.  History is obtained from the chart and critical care team.  [facility] distress [facility].  She was noted to be confused with hypoxia (88% on 2 L supplemental oxygen).  She was placed on CPAP/BiPAP but has remained somewhat somnolent.  Patient was reportedly also bradycardic when EMS arrived, with heart  rates improving from the 40s to the 60s after receiving atropine.  Exam is notable for an obese woman lying in bed.  She intermittently moves to tactile stimulation but does not respond to questions or move to commands.  Heart sounds are distant but regular with 2/6 systolic murmur.  Breath sounds are diminished anteriorly.  JVP cannot be assessed due to support devices.  There is 1+ pretibial edema bilaterally.  Labs are notable for sodium 128, potassium 5.1, BUN 56, creatinine 2, white blood cell count 10.5, hemoglobin 10.5, and platelets 311.  EKG performed at 3:28 AM shows atrial fibrillation with rapid ventricular response poor R wave progression, and possible inferior infarct.  Telemetry was personally reviewed and demonstrates paroxysmal atrial fibrillation with posttermination pauses of up to 3.2 seconds and occasional junctional rhythm interspersed with sinus rhythm.  In summary, Kristin [name] is a chronically ill 70 year old woman admitted with respiratory distress and found to have bradycardia.  She has a history of chronic respiratory failure with hypoxia, likely driven by her underlying lung disease and heart failure.  I suspect that she is volume overloaded though exam is challenging due to her morbid obesity.  I worry that her hyponatremia and impaired renal function may be due to acute on chronic HFpEF.  I agree with diuresis.  Echocardiogram should be obtained to reassess LVEF and aortic stenosis.  Continued management of underlying lung disease and respiratory failure per the critical care service.  Currently, the patient is undergoing surveillance of her aortic stenosis by the TAVR team, as her comorbidities (especially recently diagnosed lung cancer) made her a suboptimal candidate for valve intervention.  Amiodarone can be continued for now to help maintain sinus rhythm.  I would not start standing AV nodal blocking agents given intermittent bradycardia and posttermination pauses.   Anticoagulation with apixaban should be restarted when the patient can reliably take oral medications.  If it appears that she will be unable to do this for several days, use of IV heparin or therapeutic dose enoxaparin should be considered at the discretion of the primary team.  Nelva Bush, MD [place]

## 2020-09-26 NOTE — Progress Notes (Signed)
Initial Nutrition Assessment  DOCUMENTATION CODES:   Morbid obesity  INTERVENTION:   Ensure Enlive po TID, each supplement provides 350 kcal and 20 grams of protein  MVI po daily   NUTRITION DIAGNOSIS:   Increased nutrient needs related to chronic illness (COPD, lung cancer) as evidenced by estimated needs.  GOAL:   Patient will meet greater than or equal to 90% of their needs  MONITOR:   PO intake,Supplement acceptance,Labs,Weight trends,Skin,I & O's  REASON FOR ASSESSMENT:   Consult Assessment of nutrition requirement/status  ASSESSMENT:   70 y.o. 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 Pt reports good appetite and oral intake at baseline. Pt reports that she is hungry today. Pt initiated on a regular diet. RD discussed with patient the importance of adequate nutrition needed to preserve lean muscle. Pt is willing to drink chocolate supplements in hospital. Pt has been intermittently on bipap. Per chart, pt appears weight stable at baseline but is currently up ~40lbs from her UBW. Pt reports that she is holding fluid in her abdomen. UOP 1653m; pt is receiving lasix.   Medications reviewed and include: lovenox, lasix, insulin, prednisone, MVI, pepcid  Labs reviewed: Na 128(L), K 5.1 wnl, BUN 56(H), creat 1.98(H) Hgb 10.5(L), Hct 31.9(L) cbgs- 112, 162, 174, 164, 163 x 24 hrs AIC 6.2(H)- 5/13  NUTRITION - FOCUSED PHYSICAL EXAM:  Flowsheet Row Most Recent Value  Orbital Region No depletion  Upper Arm Region  No depletion  Thoracic and Lumbar Region No depletion  Buccal Region No depletion  Temple Region No depletion  Clavicle Bone Region No depletion  Clavicle and Acromion Bone Region No depletion  Scapular Bone Region No depletion  Dorsal Hand No depletion  Patellar Region No depletion  Anterior Thigh Region No depletion  Posterior Calf Region No depletion  Edema (RD Assessment) Mild  Hair Reviewed  Eyes Reviewed  Mouth Reviewed  Skin Reviewed  Nails Reviewed     Diet Order:   Diet Order            Diet 2 gram sodium Room service appropriate? Yes; Fluid consistency: Thin  Diet effective now                EDUCATION NEEDS:   Education needs have been addressed  Skin:  Skin Assessment: Reviewed RN Assessment (non pressure injury knee)  Last BM:  PTA  Height:   Ht Readings from Last 1 Encounters:  09/26/20 _0  (1.575 m)    Weight:   Wt Readings from Last 1 Encounters:  09/26/20 119 kg    Ideal Body Weight:  50 kg  BMI:  Body mass index is 47.98 kg/m.  Estimated Nutritional Needs:   Kcal:  2000-2300kcal/day  Protein:  100-115g/day  Fluid:  1.5L/day  CKoleen DistanceMS, RD, LDN Please refer to [facility] and/or RD on-call/weekend/after hours pager

## 2020-09-26 NOTE — ED Notes (Signed)
Damita Dunnings, MD notified about critical lab results.  Bicarb 34.6 PO2 86 Ph 7.29 Co2 72

## 2020-09-26 NOTE — Progress Notes (Signed)
PT Cancellation Note  Patient Details Name: Kristin [name] MRN: [id number] DOB: July 20, 1950   Cancelled Treatment:    Reason Eval/Treat Not Completed: Other (comment) PT orders received, chart reviewed. Per chart, pt is currently on bipap & MD recommending to hold PT evaluation at this time. Will f/u as able & as pt is medically appropriate.   [name], PT, DPT 09/26/20, 9:26 AM    Kristin [name] 09/26/2020, 9:25 AM

## 2020-09-26 NOTE — Progress Notes (Addendum)
OT Cancellation Note  Patient Details Name: Lamoyne Palencia Kirsten MRN: [id number] DOB: 15-Mar-1951   Cancelled Treatment:    Reason Eval/Treat Not Completed: Patient not medically ready. Consult received, chart reviewed. Per chart, pt is currently on BiPAP with active bed rest order & MD recommending to hold therapy at this time. Will f/u as able & as pt is medically appropriate.   Hanley Hays, MPH, MS, OTR/L ascom [phone] 09/26/20, 10:31 AM

## 2020-09-26 NOTE — Progress Notes (Signed)
OT Cancellation Note  Patient Details Name: Luccia Reinheimer Nippert MRN: [id number] DOB: 1951-03-03   Cancelled Treatment:    Reason Eval/Treat Not Completed: Patient not medically ready. Pt has bed rest order; conferred with MD, who states to hold therapy until pt is more stable. Will attempt OT eval at a later date, as pt is medically ready.  Josiah Lobo, PhD, MS, OTR/L 09/26/20, 2:32 PM

## 2020-09-26 NOTE — Progress Notes (Signed)
Chaplain paged to get Advanced Directive completed for patient. It has been completed with hard copy placed in patient's chart at the desk.

## 2020-09-26 NOTE — Progress Notes (Signed)
GOALS OF CARE DISCUSSION  The Clinical status was relayed to family in detail. Sister at bedside  Updated and notified of patients medical condition.  Wean off biPAP as tolerated Still wheezing  PATIENT REMAINS FULL CODE  Family understands the situation. Recommend PALLIATIVE CARE CONSULTATION   Family are satisfied with Plan of action and management. All questions answered  Additional CC time 35 mins   Tienna Bienkowski Patricia Pesa, M.D.  [facility]  Medical Director [place] Director [facility] Cardio-Pulmonary Department

## 2020-09-26 NOTE — Consult Note (Addendum)
Consultation Note Date: 09/26/2020   Patient Name: Kristin [name]  DOB: Sep 04, 1950  MRN: [id number]  Age / Sex: 70 y.o., female  PCP: [place] Referring Physician: Flora Lipps, MD  Reason for Consultation: Establishing goals of care  HPI/Patient Profile: Kristin [name] is a 70 y.o. 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    Clinical Assessment and Goals of Care: Patient is resting in bed working to breathe and coughing. She states she lives with her daughter [name]. She has 3 other children. She wants her daughter [name] to be her HPOA. Her sister arrived to bedside.  She states she is typically in bed, and gets up to use the bathroom. She states her poor breathing has been present for years, and states it is not much worse now than at baseline. She discusses needing heart surgery for her valve, but states she is not in a good place. She states her lungs have to be better to have a heart surgery and her heart needs to be better for her lungs.   We discussed her diagnoses, prognosis, GOC, EOL wishes disposition and options.  A detailed discussion was had today regarding advanced directives.  Concepts specific to code status, artifical feeding and hydration, IV antibiotics and rehospitalization were discussed.  The difference between an aggressive medical  intervention path and a comfort care path was discussed.  Values and goals of care important to patient and family were attempted to be elicited.  Discussed limitations of medical interventions to prolong quality of life in some situations and discussed the concept of human mortality.  She states she would not want to be placed on a ventilator for any reason. She states she would not want chest compressions, shocks, or a breathing tube for cardiopulmonary arrest.    I completed a MOST form today with sister at bedside, and the signed original was placed in the chart. A photocopy was placed in the chart to be scanned into EMR. The patient outlined their wishes for the following treatment decisions:  Cardiopulmonary Resuscitation: Do Not Attempt Resuscitation (DNR/No CPR)  Medical Interventions: Limited Additional Interventions: Use medical treatment, IV fluids and cardiac monitoring as indicated, DO NOT USE intubation or mechanical ventilation. May consider use of less invasive airway support such as BiPAP or CPAP. Also provide comfort measures. Transfer to the hospital if indicated. Avoid intensive care.   Antibiotics: Antibiotics if indicated  IV Fluids: IV fluids if indicated  Feeding Tube: No feeding tube    HPOA papers  to be completed by chaplain.   SUMMARY OF RECOMMENDATIONS   DNR/DNI.  She wants [name] to be her HPOA.       Primary Diagnoses: Present on Admission: . Acute on chronic heart failure with preserved ejection fraction (HFpEF) ([place]) . Obesity, Class III, BMI 40-49.9 (morbid obesity) ([place]) . OSA (obstructive sleep apnea) . Essential hypertension . Hypothyroidism   I have reviewed the medical record, interviewed the patient and family, and examined the patient. The following aspects are pertinent.  Past Medical History:  Diagnosis Date  . Anxiety   . Asthma   . CHF (congestive heart failure) ([place])    2018  . COPD (chronic obstructive pulmonary disease) ([place])   .  Diabetes mellitus without complication ([place])   . Dyspnea   . GERD (gastroesophageal reflux disease)   . History of hiatal hernia   . History of orthopnea   . History of radiation therapy 07/29/20-08/08/20   Right Lung, SBRT Dr. Gery Pray  . Hypothyroidism   . Neuropathy   . Oxygen deficiency    3L/HS  . Pain    BACK/DDD  . Peripheral vascular disease ([place])   . RLS (restless legs syndrome)   . Sleep apnea   . Wheezing    Social History   Socioeconomic History  . Marital status: Widowed    Spouse name: Not on file  . Number of children: 4  . Years of education: Not on file  . Highest education level: Not on file  Occupational History  . Occupation: Health and safety inspector  Tobacco Use  . Smoking status: Current Every Day Smoker    Packs/day: 1.00    Years: 50.00    Pack years: 50.00    Types: Cigarettes  . Smokeless tobacco: Never Used  Vaping Use  . Vaping Use: Never used  Substance and Sexual Activity  . Alcohol use: Not Currently  . Drug use: Not on file  . Sexual activity: Not on file  Other Topics Concern  . Not on file  Social History Narrative  . Not on file   Social Determinants of Health   Financial Resource Strain: Not on file  Food Insecurity: Not on file  Transportation Needs: Not on file  Physical Activity: Not on file  Stress: Not on file  Social Connections: Not on file   Family History  Problem Relation Age of Onset  . Heart disease Mother   . Cancer Father    Scheduled Meds: . [START ON 09/27/2020] amiodarone  200 mg Oral Daily  . budesonide (PULMICORT) nebulizer solution  0.5 mg Nebulization BID  . chlorhexidine  15 mL Mouth Rinse BID  . Chlorhexidine Gluconate Cloth  6 each Topical Daily  . [START ON 09/27/2020] docusate sodium  100 mg Oral BID  . enoxaparin (LOVENOX) injection  125 mg Subcutaneous Q12H  . feeding supplement  237 mL Oral TID BM  . fentaNYL  1 patch Transdermal Q72H  . furosemide  40 mg Intravenous BID  . insulin aspart   0-20 Units Subcutaneous TID WC  . insulin aspart  0-5 Units Subcutaneous QHS  . ipratropium-albuterol  3 mL Nebulization Q4H  . [START ON 09/27/2020] levothyroxine  75 mcg Oral Q0600  . mouth rinse  15 mL Mouth Rinse q12n4p  . methylPREDNISolone (SOLU-MEDROL) injection  40 mg Intravenous BID   Followed by  . [START ON 09/27/2020] predniSONE  40 mg Oral Q breakfast  . [START ON 09/27/2020] multivitamin with minerals  1 tablet Oral Daily  . [  START ON 09/27/2020] polyethylene glycol  17 g Oral Daily  . sodium chloride flush  3 mL Intravenous Q12H  . tiotropium  18 mcg Inhalation Daily   Continuous Infusions: . sodium chloride    . famotidine (PEPCID) IV Stopped (09/26/20 0404)   PRN Meds:.sodium chloride, acetaminophen, albuterol, ondansetron (ZOFRAN) IV, sodium chloride flush Medications Prior to Admission:  Prior to Admission medications   Medication Sig Start Date End Date Taking? Authorizing Provider  diclofenac Sodium (VOLTAREN) 1 % GEL Apply 2 g topically 4 (four) times daily as needed.   Yes [provider]  albuterol (VENTOLIN HFA) 108 (90 Base) MCG/ACT inhaler Inhale 2 puffs into the lungs every 4 (four) hours as needed for wheezing or shortness of breath.     [provider]  amiodarone (PACERONE) 200 MG tablet Take 1 tablet (200 mg total) by mouth daily. 06/13/20   Dunn, Areta Haber, PA-C  apixaban (ELIQUIS) 5 MG TABS tablet Take 1 tablet (5 mg total) by mouth 2 (two) times daily. 05/29/20   Dunn, Areta Haber, PA-C  atorvastatin (LIPITOR) 80 MG tablet Take 80 mg by mouth at bedtime.  09/05/19   [provider]  carvedilol (COREG) 3.125 MG tablet Take 3.125 mg by mouth 2 (two) times daily. 06/14/20   [provider]  citalopram (CELEXA) 40 MG tablet Take 40 mg by mouth daily. 06/14/20   [provider]  [employer] ASPIRIN ADULT LOW DOSE 81 MG chewable tablet Chew 81 mg by mouth daily. Patient not taking: Reported on 09/18/2020 03/19/20   [provider]   docusate sodium (COLACE) 100 MG capsule Take 100 mg by mouth at bedtime as needed. 03/19/20   [provider]  esomeprazole (NEXIUM) 40 MG capsule Take 40 mg by mouth daily. 10/01/19   [provider]  ezetimibe (ZETIA) 10 MG tablet Take 1 tablet (10 mg total) by mouth daily. 06/18/20 06/13/21  Marrianne Mood D, PA-C  famotidine (PEPCID) 40 MG tablet Take 40 mg by mouth daily.    [provider]  fenofibrate 160 MG tablet Take 160 mg by mouth daily.    [provider]  ferrous sulfate 325 (65 FE) MG tablet Take 325 mg by mouth daily. 06/14/20   [provider]  furosemide (LASIX) 20 MG tablet Take 1 tablet (20 mg total) by mouth daily. 08/15/20 08/10/21  Marrianne Mood D, PA-C  gabapentin (NEURONTIN) 600 MG tablet Take 600 mg by mouth 2 (two) times daily.  11/20/19   [provider]  hydrOXYzine (VISTARIL) 50 MG capsule Take 50 mg by mouth every 6 (six) hours as needed for anxiety. 03/14/20   [provider]  ipratropium-albuterol (DUONEB) 0.5-2.5 (3) MG/3ML SOLN Take 3 mLs by nebulization 4 (four) times daily. 08/07/20   [provider]  irbesartan (AVAPRO) 300 MG tablet Take 0.5 tablets (150 mg total) by mouth daily. 06/30/20 06/25/21  Loel Dubonnet, NP  levothyroxine (SYNTHROID, LEVOTHROID) 75 MCG tablet Take 75 mcg by mouth daily before breakfast.    [provider]  lidocaine (LIDODERM) 5 % 3 patches daily. 05/28/20   [provider]  Melatonin 10 MG TABS Take 10 mg by mouth at bedtime as needed (sleep).    [provider]  metFORMIN (GLUCOPHAGE) 500 MG tablet Take 500 mg by mouth 2 (two) times daily. 09/19/19   [provider]  MOVANTIK 25 MG TABS tablet Take 25 mg by mouth daily. 07/21/20   [provider]  nicotine (NICODERM CQ -  DOSED IN MG/24 HOURS) 21 mg/24hr patch Place 1 patch (21 mg total) onto the skin daily. 08/15/20   Marrianne Mood D, PA-C  oxyCODONE-acetaminophen  (PERCOCET) 5-325 MG tablet Take 1 tablet by mouth every 6 (six) hours as needed for severe pain. 09/12/20   Johnn Hai, PA-C  potassium chloride SA (KLOR-CON) 20 MEQ tablet Take 1 tablet (20 mEq total) by mouth 2 (two) times daily. For one day. Patient not taking: Reported on 09/26/2020 06/18/20   Marrianne Mood D, PA-C  sitaGLIPtin (JANUVIA) 100 MG tablet Take 100 mg by mouth daily.    [provider]  Tiotropium Bromide Monohydrate 2.5 MCG/ACT AERS Inhale 2 puffs into the lungs daily.     [provider]  Vitamin D, Ergocalciferol, (DRISDOL) 1.25 MG (50000 UNIT) CAPS capsule Take 1 capsule by mouth once a week. 08/14/20   [provider]   Allergies  Allergen Reactions  . Altace [Ramipril] Swelling   Review of Systems  Constitutional: Positive for activity change.  Respiratory: Positive for shortness of breath and wheezing.     Physical Exam Pulmonary:     Effort: Respiratory distress present.     Breath sounds: Wheezing present.     Comments: Productive cough Neurological:     Mental Status: She is alert.     Vital Signs: BP (!) 130/53   Pulse 85   Temp 97.7 F (36.5 C) (Axillary)   Resp 14   Ht 5\' 2"  (1.575 m)   Wt 119 kg   LMP  (LMP Unknown)   SpO2 94%   BMI 47.98 kg/m  Pain Scale: CPOT   Pain Score: 0-No pain   SpO2: SpO2: 94 % O2 Device:SpO2: 94 % O2 Flow Rate: .O2 Flow Rate (L/min): 4 L/min  IO: Intake/output summary:   Intake/Output Summary (Last 24 hours) at 09/26/2020 1613 Last data filed at 09/26/2020 1200 Gross per 24 hour  Intake 987.16 ml  Output 3250 ml  Net -2262.84 ml    LBM: Last BM Date:  (PTA) Baseline Weight: Weight: 122.7 kg Most recent weight: Weight: 119 kg         Time In: 3:30 Time Out: 4:20 Time Total: 50 min Greater than 50%  of this time was spent counseling and coordinating care related to the above assessment and plan.  Signed by: Asencion Gowda, NP   Please contact Palliative  Medicine Team phone at [phone] for questions and concerns.  For individual provider: See Shea Evans

## 2020-09-26 NOTE — Plan of Care (Signed)
  Problem: Clinical Measurements: Goal: Respiratory complications will improve Outcome: Progressing Goal: Cardiovascular complication will be avoided Outcome: Progressing   Problem: Activity: Goal: Risk for activity intolerance will decrease Outcome: Progressing   Problem: Education: Goal: Knowledge of General Education information will improve Description: Including pain rating scale, medication(s)/side effects and non-pharmacologic comfort measures Outcome: Not Progressing   Problem: Clinical Measurements: Goal: Ability to maintain clinical measurements within normal limits will improve Outcome: Not Progressing Goal: Will remain free from infection Outcome: Not Progressing Goal: Diagnostic test results will improve Outcome: Not Progressing   Problem: Nutrition: Goal: Adequate nutrition will be maintained Outcome: Not Progressing   Problem: Elimination: Goal: Will not experience complications related to bowel motility Outcome: Not Progressing Goal: Will not experience complications related to urinary retention Outcome: Not Progressing

## 2020-09-26 NOTE — Consult Note (Signed)
NAME:  Kristin [name], MRN:  [id number], DOB:  25-Sep-1950, LOS: 1 ADMISSION DATE:  09/25/2020, CONSULTATION DATE: 09/26/2020 REFERRING MD: Dr. Damita Dunnings, CHIEF COMPLAINT: Respiratory Distress   History of Present Illness:  This is a 70 yo female who presented to [facility] ER via EMS on 05/13 with altered mental status and respiratory distress.  Per ER notes EMS reported pt developed worsening shortness of breath 2hrs prior to arrival with increased confusion.  Pt placed on CPAP en route to the ER by EMS.  She was also noted to be bradycardic hr 40's requiring 1 mg of atropine with improvement in heart rate. She does have a hx of COPD, OSA, malignant neoplasm of right lung (currently undergoing radiation), and current everyday smoker.   ED course Upon arrival to the ER pts O2 sats on 2L were 88%; she remained somnolent; and pt noted to have increased work of breathing.  Therefore, she was transitioned to Bipap, received duoneb x3, and 125 mg iv solumedrol x1 dose.  She recently presented to the ER on 04/29 following a fall and was found to have a comminuted, impacted fracture of the proximal humerus on 04/29.  Per orthopedic recommendations a shoulder immobilizer using an extension was applied and pt prescribed 5-325 percocet tablet q6hrs prn.  According to pts family she has been taking her narcotic medication as prescribed.  Due to concern pts somnolence could be secondary to narcotic use pt received narcan x1 dose with no change in mentation.    EKG revealed nsr, hr 60, and no ischemic changes. BMP results were: Na+ 126, K+ 5.9, chloride 86, glucose 155, BUN 58, creatinine 2.16, BNP 1,972.2, wbc 13.8, and vbg pH 7.26/pCO2 78/acid-base excess 5.7/bicarb 35.0.  COVID-19/Influenza PCR negative, however CXR concerning for pulmonary edema vs. CAP.  Pt received 80 mg iv lasix x1 dose.  She was subsequently admitted to the stepdown unit for additional workup and treatment.  PCCM team consulted to assist with  management.    Pertinent  Medical History  OSA Restless Leg Syndrome  PVD Chronic O2 @2L  Neuropathy  Hypothyroidism Malignant Neoplasm of Right Lung (currently undergoing radiation) Current Everyday Smoker  Orthopnea  Hiatal Hernia  GERD  Dyspnea  Type II Diabetes Mellitus  COPD  Asthma  Anxiety  Chronic Diastolic CHF (Echo EF 55 to 60% 03/29/2020)   Significant Hospital Events: Including procedures, antibiotic start and stop dates in addition to other pertinent events   . Pt admitted to the stepdown unit PCCM consulted   Interim History / Subjective:  Pt somnolent and not following commands   Objective   Blood pressure (!) 122/49, pulse 74, temperature 98.9 F (37.2 C), temperature source Axillary, resp. rate 16, height 5\' 2"  (1.575 m), weight 122.7 kg, SpO2 98 %.       No intake or output data in the 24 hours ending 09/26/20 0216 Filed Weights   09/25/20 2219  Weight: 122.7 kg    Examination: General: acutely ill appearing obese female, NAD on Bipap HENT: unable to assess for JVD due to large neck  Lungs: diffuse crackles and wheezes throughout, even, non labored  Cardiovascular: sinus bradycardia, no R/G, 2+ bilateral pitting edema, 2+ radial/2+ distal pulses present  Abdomen: +BS x4, obese, soft, non distended  Extremities: hypercapnic jerks present, normal tone  Neuro: somnolent, unable to follow commands, withdraws from painful stimulation, PERRL  GU: no foley present   Labs/imaging that I havepersonally reviewed  (right click and "Reselect all SmartList Selections" daily)  05/13: Labs reviewed as outlined above under HPI   Resolved Hospital Problem list   N/A  Assessment & Plan:   Acute on chronic hypercapnic hypoxic respiratory failure secondary to acute CHF exacerbation and AECOPD complicated by OSA  Possible CAP Hx: Malignant neoplasm of right lung, asthma, and current everyday smoker  Continue Bipap for now due to severe hypercapnia  IV and  nebulized steroids  Scheduled and prn bronchodilator therapy  Trend WBC and monitor fever curve  Will check PCT  Continue empiric ceftriaxone and azithromycin for now  Once mentation improves will need smoking cessation counseling  HIGH RISK FOR MECHANICAL INTUBATION   Acute diastolic CHF exacerbation~(EF 55 to 60% via Echo 03/2020)  Sinus bradycardia likely secondary to outpatient beta blocker therapy and antiarrhythmic medications  Continuous telemetry monitoring  Hold outpatient beta blocker and antiarrhythmic for now   Continue iv lasix  Echo pending  Acute on chronic renal failure with hyperkalemia  Trend BMP  Replace electrolytes as indicated  Monitor UOP  Avoid nephrotoxic medications   Closed fracture of proximal end of right humerus on 09/12/2020 Continue shoulder immobilizer for support/protection and limit use of right arm according to outpatient orthopedic progress note 09/23/20  Prn tylenol for pain management for now   Type II diabetes mellitus  CBG's ac/hs  SSI   Acute encephalopathy secondary to hypercapnia further complicated by narcotics  Avoid sedating medications for now  CT Head results pending  Frequent reorientation   Best practice (right click and "Reselect all SmartList Selections" daily)  Diet:  NPO Pain/Anxiety/Delirium protocol (if indicated): No VAP protocol (if indicated): Not indicated DVT prophylaxis: Subcutaneous Heparin GI prophylaxis: H2B Glucose control:  SSI Yes Central venous access:  N/A Arterial line:  N/A Foley:  N/A Mobility:  bed rest  PT consulted: N/A Last date of multidisciplinary goals of care discussion [N/A] Code Status:  full code Disposition: Stepdown Unit   Labs   CBC: Recent Labs  Lab 09/25/20 2158  WBC 13.8*  NEUTROABS 12.2*  HGB 10.8*  HCT 34.0*  MCV 92.4  PLT 371    Basic Metabolic Panel: Recent Labs  Lab 09/25/20 2158  NA 126*  K 5.9*  CL 86*  CO2 31  GLUCOSE 155*  BUN 58*  CREATININE  2.16*  CALCIUM 8.0*   GFR: Estimated Creatinine Clearance: 31.1 mL/min (A) (by C-G formula based on SCr of 2.16 mg/dL (H)). Recent Labs  Lab 09/25/20 2158  WBC 13.8*    Liver Function Tests: Recent Labs  Lab 09/25/20 2158  AST 32  ALT 24  ALKPHOS 42  BILITOT 0.5  PROT 6.9  ALBUMIN 3.0*   No results for input(s): LIPASE, AMYLASE in the last 168 hours. No results for input(s): AMMONIA in the last 168 hours.  ABG    Component Value Date/Time   PHART 7.29 (L) 09/26/2020 0121   PCO2ART 72 (HH) 09/26/2020 0121   PO2ART 86 09/26/2020 0121   HCO3 34.6 (H) 09/26/2020 0121   O2SAT 95.3 09/26/2020 0121     Coagulation Profile: No results for input(s): INR, PROTIME in the last 168 hours.  Cardiac Enzymes: No results for input(s): CKTOTAL, CKMB, CKMBINDEX, TROPONINI in the last 168 hours.  HbA1C: Hgb A1c MFr Bld  Date/Time Value Ref Range Status  03/28/2020 10:06 PM 6.0 (H) 4.8 - 5.6 % Final    Comment:    (NOTE) Pre diabetes:          5.7%-6.4%  Diabetes:              >  6.4%  Glycemic control for   <7.0% adults with diabetes     CBG: Recent Labs  Lab 09/26/20 0130  GLUCAP 162*    Review of Systems:   Unable to assess pt somnolent on Bipap  Past Medical History:  She,  has a past medical history of Anxiety, Asthma, CHF (congestive heart failure) ([place]), COPD (chronic obstructive pulmonary disease) ([place]), Diabetes mellitus without complication ([place]), Dyspnea, GERD (gastroesophageal reflux disease), History of hiatal hernia, History of orthopnea, History of radiation therapy (07/29/20-08/08/20), Hypothyroidism, Neuropathy, Oxygen deficiency, Pain, Peripheral vascular disease ([place]), RLS (restless legs syndrome), Sleep apnea, and Wheezing.   Surgical History:   Past Surgical History:  Procedure Laterality Date  . BREAST SURGERY    . CATARACT EXTRACTION W/PHACO Right 03/02/2018   Procedure: CATARACT EXTRACTION PHACO AND INTRAOCULAR LENS PLACEMENT ([place]);  Surgeon:  Marchia Meiers, MD;  Location: [facility] ORS;  Service: Ophthalmology;  Laterality: Right;  [place] 01:15 CDE 16.46 Fluid pack lot # [id number]  . CATARACT EXTRACTION W/PHACO Left 04/27/2018   Procedure: CATARACT EXTRACTION PHACO AND INTRAOCULAR LENS PLACEMENT ([place])- LEFT DIABETIC;  Surgeon: Marchia Meiers, MD;  Location: [facility] ORS;  Service: Ophthalmology;  Laterality: Left;  Lot # [id number] H [place]: 00:46.3 CDE: 8.07   . CYST EXCISION     FOREHEAD  . FOOT SURGERY     CYST  . RIGHT/LEFT HEART CATH AND CORONARY ANGIOGRAPHY N/A 03/31/2020   Procedure: RIGHT/LEFT HEART CATH AND CORONARY ANGIOGRAPHY;  Surgeon: Wellington Hampshire, MD;  Location: [facility];  Service: Cardiovascular;  Laterality: N/A;  . TUBAL LIGATION       Social History:   reports that she has been smoking cigarettes. She has a 50.00 pack-year smoking history. She has never used smokeless tobacco. She reports previous alcohol use.   Family History:  Her family history includes Cancer in her father; Heart disease in her mother.   Allergies Allergies  Allergen Reactions  . Altace [Ramipril] Swelling     Home Medications  Prior to Admission medications   Medication Sig Start Date End Date Taking? Authorizing Provider  albuterol (VENTOLIN HFA) 108 (90 Base) MCG/ACT inhaler Inhale 2 puffs into the lungs every 4 (four) hours as needed for wheezing or shortness of breath.     [provider]  amiodarone (PACERONE) 200 MG tablet Take 1 tablet (200 mg total) by mouth daily. 06/13/20   Dunn, Areta Haber, PA-C  apixaban (ELIQUIS) 5 MG TABS tablet Take 1 tablet (5 mg total) by mouth 2 (two) times daily. 05/29/20   Dunn, Areta Haber, PA-C  atorvastatin (LIPITOR) 80 MG tablet Take 80 mg by mouth at bedtime.  09/05/19   [provider]  carvedilol (COREG) 3.125 MG tablet Take 3.125 mg by mouth 2 (two) times daily. 06/14/20   [provider]  citalopram (CELEXA) 40 MG tablet Take 40 mg by mouth daily. 06/14/20   [provider]  [facility] ASPIRIN ADULT LOW DOSE 81 MG chewable tablet Chew 81 mg by mouth daily. Patient not taking: Reported on 09/18/2020 03/19/20   [provider]  docusate sodium (COLACE) 100 MG capsule Take 100 mg by mouth at bedtime as needed. 03/19/20   [provider]  esomeprazole (NEXIUM) 40 MG capsule Take 40 mg by mouth daily. 10/01/19   [provider]  ezetimibe (ZETIA) 10 MG tablet Take 1 tablet (10 mg total) by mouth daily. 06/18/20 06/13/21  Marrianne Mood D, PA-C  famotidine (PEPCID) 40 MG tablet Take 40 mg by  mouth daily.    [provider]  fenofibrate 160 MG tablet Take 160 mg by mouth daily.    [provider]  ferrous sulfate 325 (65 FE) MG tablet Take 325 mg by mouth daily. 06/14/20   [provider]  furosemide (LASIX) 20 MG tablet Take 1 tablet (20 mg total) by mouth daily. 08/15/20 08/10/21  Marrianne Mood D, PA-C  gabapentin (NEURONTIN) 600 MG tablet Take 600 mg by mouth 2 (two) times daily.  11/20/19   [provider]  hydrOXYzine (VISTARIL) 50 MG capsule Take 50 mg by mouth every 6 (six) hours as needed for anxiety. 03/14/20   [provider]  ipratropium-albuterol (DUONEB) 0.5-2.5 (3) MG/3ML SOLN Take 3 mLs by nebulization 4 (four) times daily. 08/07/20   [provider]  irbesartan (AVAPRO) 300 MG tablet Take 0.5 tablets (150 mg total) by mouth daily. 06/30/20 06/25/21  Loel Dubonnet, NP  levothyroxine (SYNTHROID, LEVOTHROID) 75 MCG tablet Take 75 mcg by mouth daily before breakfast.    [provider]  lidocaine (LIDODERM) 5 % SMARTSIG:Patch(s) Topical 05/28/20   [provider]  Melatonin 10 MG TABS Take 10 mg by mouth at bedtime as needed (sleep).    [provider]  metFORMIN (GLUCOPHAGE) 500 MG tablet Take 500 mg by mouth 2 (two) times daily. 09/19/19   [provider]  MOVANTIK 25 MG TABS tablet Take 25 mg by mouth daily. 07/21/20   [provider]   nicotine (NICODERM CQ - DOSED IN MG/24 HOURS) 21 mg/24hr patch Place 1 patch (21 mg total) onto the skin daily. 08/15/20   Marrianne Mood D, PA-C  oxyCODONE-acetaminophen (PERCOCET) 5-325 MG tablet Take 1 tablet by mouth every 6 (six) hours as needed for severe pain. 09/12/20   Johnn Hai, PA-C  pantoprazole (PROTONIX) 40 MG tablet Take 40 mg by mouth 2 (two) times daily. 08/13/20   [provider]  potassium chloride SA (KLOR-CON) 20 MEQ tablet Take 1 tablet (20 mEq total) by mouth 2 (two) times daily. For one day. 06/18/20   Marrianne Mood D, PA-C  sitaGLIPtin (JANUVIA) 100 MG tablet Take 100 mg by mouth daily.    [provider]  Tiotropium Bromide Monohydrate 2.5 MCG/ACT AERS Inhale 2 puffs into the lungs daily.     [provider]  Vitamin D, Ergocalciferol, (DRISDOL) 1.25 MG (50000 UNIT) CAPS capsule Take 1 capsule by mouth once a week. 08/14/20   [provider]     Updated pts granddaughter at bedside regarding pts condition and current plan of care all questions were answered.   Marda Stalker, [place] Pager [phone] (please enter 7 digits) PCCM Consult Pager [phone] (please enter 7 digits) a

## 2020-09-26 NOTE — Progress Notes (Signed)
ANTICOAGULATION CONSULT NOTE - Initial Consult  Pharmacy Consult for Lovenox  Indication: atrial fibrillation  Allergies  Allergen Reactions  . Altace [Ramipril] Swelling    Patient Measurements: Height: 5\' 2"  (157.5 cm) Weight: 123 kg (271 lb 2.7 oz) IBW/kg (Calculated) : 50.1 Heparin Dosing Weight:    Vital Signs: Temp: 98.2 F (36.8 C) (05/13 0300) Temp Source: Axillary (05/13 0300) BP: 112/52 (05/13 0415) Pulse Rate: 69 (05/13 0415)  Labs: Recent Labs    09/25/20 2158 09/25/20 2357 09/26/20 0150  HGB 10.8*  --   --   HCT 34.0*  --   --   PLT 352  --   --   CREATININE 2.16*  --  2.05*  TROPONINIHS 12 11  --     Estimated Creatinine Clearance: 32.9 mL/min (A) (by C-G formula based on SCr of 2.05 mg/dL (H)).   Medical History: Past Medical History:  Diagnosis Date  . Anxiety   . Asthma   . CHF (congestive heart failure) ([place])    2018  . COPD (chronic obstructive pulmonary disease) ([place])   . Diabetes mellitus without complication ([place])   . Dyspnea   . GERD (gastroesophageal reflux disease)   . History of hiatal hernia   . History of orthopnea   . History of radiation therapy 07/29/20-08/08/20   Right Lung, SBRT Dr. Gery Pray  . Hypothyroidism   . Neuropathy   . Oxygen deficiency    3L/HS  . Pain    BACK/DDD  . Peripheral vascular disease ([place])   . RLS (restless legs syndrome)   . Sleep apnea   . Wheezing     Medications:  Medications Prior to Admission  Medication Sig Dispense Refill Last Dose  . albuterol (VENTOLIN HFA) 108 (90 Base) MCG/ACT inhaler Inhale 2 puffs into the lungs every 4 (four) hours as needed for wheezing or shortness of breath.      Marland Kitchen amiodarone (PACERONE) 200 MG tablet Take 1 tablet (200 mg total) by mouth daily. 90 tablet 3   . apixaban (ELIQUIS) 5 MG TABS tablet Take 1 tablet (5 mg total) by mouth 2 (two) times daily. 60 tablet 5   . atorvastatin (LIPITOR) 80 MG tablet Take 80 mg by mouth at bedtime.      . carvedilol  (COREG) 3.125 MG tablet Take 3.125 mg by mouth 2 (two) times daily.     . citalopram (CELEXA) 40 MG tablet Take 40 mg by mouth daily.     . [facility] ASPIRIN ADULT LOW DOSE 81 MG chewable tablet Chew 81 mg by mouth daily. (Patient not taking: Reported on 09/18/2020)     . docusate sodium (COLACE) 100 MG capsule Take 100 mg by mouth at bedtime as needed.     Marland Kitchen esomeprazole (NEXIUM) 40 MG capsule Take 40 mg by mouth daily.     Marland Kitchen ezetimibe (ZETIA) 10 MG tablet Take 1 tablet (10 mg total) by mouth daily. 90 tablet 3   . famotidine (PEPCID) 40 MG tablet Take 40 mg by mouth daily.     . fenofibrate 160 MG tablet Take 160 mg by mouth daily.     . ferrous sulfate 325 (65 FE) MG tablet Take 325 mg by mouth daily.     . furosemide (LASIX) 20 MG tablet Take 1 tablet (20 mg total) by mouth daily. 90 tablet 3   . gabapentin (NEURONTIN) 600 MG tablet Take 600 mg by mouth 2 (two) times daily.      . hydrOXYzine (VISTARIL)  50 MG capsule Take 50 mg by mouth every 6 (six) hours as needed for anxiety.     Marland Kitchen ipratropium-albuterol (DUONEB) 0.5-2.5 (3) MG/3ML SOLN Take 3 mLs by nebulization 4 (four) times daily.     . irbesartan (AVAPRO) 300 MG tablet Take 0.5 tablets (150 mg total) by mouth daily. 90 tablet 3   . levothyroxine (SYNTHROID, LEVOTHROID) 75 MCG tablet Take 75 mcg by mouth daily before breakfast.     . lidocaine (LIDODERM) 5 % SMARTSIG:Patch(s) Topical     . Melatonin 10 MG TABS Take 10 mg by mouth at bedtime as needed (sleep).     . metFORMIN (GLUCOPHAGE) 500 MG tablet Take 500 mg by mouth 2 (two) times daily.     Marland Kitchen MOVANTIK 25 MG TABS tablet Take 25 mg by mouth daily.     . nicotine (NICODERM CQ - DOSED IN MG/24 HOURS) 21 mg/24hr patch Place 1 patch (21 mg total) onto the skin daily. 28 patch 5   . oxyCODONE-acetaminophen (PERCOCET) 5-325 MG tablet Take 1 tablet by mouth every 6 (six) hours as needed for severe pain. 20 tablet 0   . pantoprazole (PROTONIX) 40 MG tablet Take 40 mg by mouth 2 (two) times daily.      . potassium chloride SA (KLOR-CON) 20 MEQ tablet Take 1 tablet (20 mEq total) by mouth 2 (two) times daily. For one day. 2 tablet 0   . sitaGLIPtin (JANUVIA) 100 MG tablet Take 100 mg by mouth daily.     . Tiotropium Bromide Monohydrate 2.5 MCG/ACT AERS Inhale 2 puffs into the lungs daily.      . Vitamin D, Ergocalciferol, (DRISDOL) 1.25 MG (50000 UNIT) CAPS capsule Take 1 capsule by mouth once a week.       Assessment: Pharmacy consulted to dose lovenox in this 70 year old female with Afib.   TBW = 123 kg CrCl = 32.9 ml/min   Goal of Therapy:  prevention of thromboembolism Monitor platelets by anticoagulation protocol: Yes   Plan:  Lovenox 125 mg SQ Q12H ordered to start on 5/13 @ 0500.  Will check CBC/platelets daily.   [place] Gustafson D 09/26/2020,4:55 AM

## 2020-09-26 NOTE — Progress Notes (Signed)
Pt transported to CT then ICU on the Bipap without incident. Pt remains on Bipap and is tol well at this time. Report given to ICU RT.

## 2020-09-26 NOTE — Plan of Care (Addendum)
Pt unable to get OBB today d/t SOB and inability to use RUE.  Pt calls out in pain anytime staff comes near her right arm. She did request sling to be off, RUE elevated on a pillow. Tolerated 6 L [place] brifely this AM, O2 sats ok but her cardiac rhythm was transitioning back and forth rapidly between SR and AFib, would brady to 30s when transitioning to SR, up to 125 when transitioning into AFib.  Eventually she was found in room w/[place] off and seemed sleepy again.  BIPap was replaced and she rested fitfully for a while, cardiac rhythm returned to NSR.  When lunch tray came she awoke fairly well so we tried 6L [place] again.  She tolerated much better and remains on [place] at this time, titrated to 4L by RT.  Very strong productive cough, thick tan secretions.  She is using [place] herself.  Her sister was very helpful today with patient care and helped patient to eat/turn.  Patient oriented x 4 at this time, forgetful of time but easily reoriented.  Patient is very difficult to turn as she does not want her right arm or shoulder to be touched.  Started fentanyl patch on left arm to treat any withdrawal s/sx from Percocet. DNR wristband placed per order

## 2020-09-27 ENCOUNTER — Inpatient Hospital Stay: Payer: 59

## 2020-09-27 DIAGNOSIS — I4891 Unspecified atrial fibrillation: Secondary | ICD-10-CM

## 2020-09-27 DIAGNOSIS — I350 Nonrheumatic aortic (valve) stenosis: Secondary | ICD-10-CM | POA: Diagnosis not present

## 2020-09-27 DIAGNOSIS — J9601 Acute respiratory failure with hypoxia: Secondary | ICD-10-CM | POA: Diagnosis not present

## 2020-09-27 DIAGNOSIS — J9621 Acute and chronic respiratory failure with hypoxia: Secondary | ICD-10-CM | POA: Diagnosis not present

## 2020-09-27 DIAGNOSIS — R079 Chest pain, unspecified: Secondary | ICD-10-CM

## 2020-09-27 DIAGNOSIS — J441 Chronic obstructive pulmonary disease with (acute) exacerbation: Secondary | ICD-10-CM | POA: Diagnosis not present

## 2020-09-27 DIAGNOSIS — I5033 Acute on chronic diastolic (congestive) heart failure: Secondary | ICD-10-CM | POA: Diagnosis not present

## 2020-09-27 DIAGNOSIS — J9622 Acute and chronic respiratory failure with hypercapnia: Secondary | ICD-10-CM | POA: Diagnosis not present

## 2020-09-27 LAB — CBC WITH DIFFERENTIAL/PLATELET
Abs Immature Granulocytes: 0.08 10*3/uL — ABNORMAL HIGH (ref 0.00–0.07)
Basophils Absolute: 0 10*3/uL (ref 0.0–0.1)
Basophils Relative: 0 %
Eosinophils Absolute: 0 10*3/uL (ref 0.0–0.5)
Eosinophils Relative: 0 %
HCT: 36.5 % (ref 36.0–46.0)
Hemoglobin: 11.4 g/dL — ABNORMAL LOW (ref 12.0–15.0)
Immature Granulocytes: 1 %
Lymphocytes Relative: 2 %
Lymphs Abs: 0.2 10*3/uL — ABNORMAL LOW (ref 0.7–4.0)
MCH: 29.2 pg (ref 26.0–34.0)
MCHC: 31.2 g/dL (ref 30.0–36.0)
MCV: 93.4 fL (ref 80.0–100.0)
Monocytes Absolute: 0.3 10*3/uL (ref 0.1–1.0)
Monocytes Relative: 3 %
Neutro Abs: 12.8 10*3/uL — ABNORMAL HIGH (ref 1.7–7.7)
Neutrophils Relative %: 94 %
Platelets: 364 10*3/uL (ref 150–400)
RBC: 3.91 MIL/uL (ref 3.87–5.11)
RDW: 14.6 % (ref 11.5–15.5)
WBC: 13.4 10*3/uL — ABNORMAL HIGH (ref 4.0–10.5)
nRBC: 0 % (ref 0.0–0.2)

## 2020-09-27 LAB — BLOOD GAS, ARTERIAL
Acid-Base Excess: 19.1 mmol/L — ABNORMAL HIGH (ref 0.0–2.0)
Bicarbonate: 47.9 mmol/L — ABNORMAL HIGH (ref 20.0–28.0)
FIO2: 0.45
O2 Saturation: 95 %
Patient temperature: 37
pCO2 arterial: 81 mmHg (ref 32.0–48.0)
pH, Arterial: 7.38 (ref 7.350–7.450)
pO2, Arterial: 77 mmHg — ABNORMAL LOW (ref 83.0–108.0)

## 2020-09-27 LAB — GLUCOSE, CAPILLARY
Glucose-Capillary: 169 mg/dL — ABNORMAL HIGH (ref 70–99)
Glucose-Capillary: 151 mg/dL — ABNORMAL HIGH (ref 70–99)
Glucose-Capillary: 260 mg/dL — ABNORMAL HIGH (ref 70–99)
Glucose-Capillary: 138 mg/dL — ABNORMAL HIGH (ref 70–99)

## 2020-09-27 LAB — COMPREHENSIVE METABOLIC PANEL
ALT: 24 U/L (ref 0–44)
AST: 27 U/L (ref 15–41)
Albumin: 3.1 g/dL — ABNORMAL LOW (ref 3.5–5.0)
Alkaline Phosphatase: 44 U/L (ref 38–126)
Anion gap: 10 (ref 5–15)
BUN: 56 mg/dL — ABNORMAL HIGH (ref 8–23)
CO2: 33 mmol/L — ABNORMAL HIGH (ref 22–32)
Calcium: 8.8 mg/dL — ABNORMAL LOW (ref 8.9–10.3)
Chloride: 88 mmol/L — ABNORMAL LOW (ref 98–111)
Creatinine, Ser: 1.69 mg/dL — ABNORMAL HIGH (ref 0.44–1.00)
GFR, Estimated: 33 mL/min — ABNORMAL LOW (ref >60)
Glucose, Bld: 181 mg/dL — ABNORMAL HIGH (ref 70–99)
Potassium: 5.4 mmol/L — ABNORMAL HIGH (ref 3.5–5.1)
Sodium: 131 mmol/L — ABNORMAL LOW (ref 135–145)
Total Bilirubin: 0.6 mg/dL (ref 0.3–1.2)
Total Protein: 7.3 g/dL (ref 6.5–8.1)

## 2020-09-27 LAB — TROPONIN I (HIGH SENSITIVITY)
Troponin I (High Sensitivity): 18 ng/L — ABNORMAL HIGH (ref <18)
Troponin I (High Sensitivity): 19 ng/L — ABNORMAL HIGH (ref <18)

## 2020-09-27 LAB — MAGNESIUM: Magnesium: 2.1 mg/dL (ref 1.7–2.4)

## 2020-09-27 LAB — PROCALCITONIN: Procalcitonin: <0.1 ng/mL

## 2020-09-27 LAB — PHOSPHORUS: Phosphorus: 5.2 mg/dL — ABNORMAL HIGH (ref 2.5–4.6)

## 2020-09-27 LAB — BRAIN NATRIURETIC PEPTIDE: B Natriuretic Peptide: 1737.3 pg/mL — ABNORMAL HIGH (ref 0.0–100.0)

## 2020-09-27 MED ORDER — SODIUM CHLORIDE 0.9 % IV SOLN
3.0000 g | Freq: Four times a day (QID) | INTRAVENOUS | Status: AC
  Administered 2020-09-27 – 2020-10-01 (×19): 3 g via INTRAVENOUS
  Filled 2020-09-27 (×2): qty 8
  Filled 2020-09-27 (×4): qty 3
  Filled 2020-09-27: qty 8
  Filled 2020-09-27: qty 3
  Filled 2020-09-27: qty 8
  Filled 2020-09-27: qty 3
  Filled 2020-09-27: qty 8
  Filled 2020-09-27 (×2): qty 3
  Filled 2020-09-27 (×2): qty 8
  Filled 2020-09-27: qty 3
  Filled 2020-09-27: qty 8
  Filled 2020-09-27 (×4): qty 3

## 2020-09-27 MED ORDER — DILTIAZEM HCL 60 MG PO TABS: 60.0000 mg | ORAL_TABLET | Freq: Three times a day (TID) | ORAL | Status: DC

## 2020-09-27 MED ORDER — AMIODARONE HCL 200 MG PO TABS
400.0000 mg | ORAL_TABLET | Freq: Two times a day (BID) | ORAL | Status: DC
  Administered 2020-09-27 – 2020-10-03 (×12): 400 mg via ORAL
  Filled 2020-09-27 (×12): qty 2

## 2020-09-27 MED ORDER — PROCHLORPERAZINE EDISYLATE 10 MG/2ML IJ SOLN
10.0000 mg | Freq: Four times a day (QID) | INTRAMUSCULAR | Status: DC | PRN
  Administered 2020-09-27: 10 mg via INTRAVENOUS
  Filled 2020-09-27 (×3): qty 2

## 2020-09-27 MED ORDER — SODIUM ZIRCONIUM CYCLOSILICATE 5 G PO PACK
10.0000 g | PACK | Freq: Once | ORAL | Status: AC
  Administered 2020-09-27: 10 g via ORAL
  Filled 2020-09-27: qty 2

## 2020-09-27 MED ORDER — ALPRAZOLAM 0.5 MG PO TABS
0.5000 mg | ORAL_TABLET | Freq: Every evening | ORAL | Status: DC | PRN
  Administered 2020-09-27 – 2020-10-02 (×6): 0.5 mg via ORAL
  Filled 2020-09-27 (×6): qty 1

## 2020-09-27 MED ORDER — METHYLPREDNISOLONE SODIUM SUCC 125 MG IJ SOLR
60.0000 mg | Freq: Two times a day (BID) | INTRAMUSCULAR | Status: DC
  Administered 2020-09-27 – 2020-09-28 (×3): 60 mg via INTRAVENOUS
  Filled 2020-09-27 (×3): qty 2

## 2020-09-27 MED ORDER — DILTIAZEM HCL 60 MG PO TABS
60.0000 mg | ORAL_TABLET | Freq: Three times a day (TID) | ORAL | Status: DC
  Administered 2020-09-27: 60 mg via ORAL
  Filled 2020-09-27: qty 1

## 2020-09-27 MED ORDER — FUROSEMIDE 10 MG/ML IJ SOLN
40.0000 mg | Freq: Once | INTRAMUSCULAR | Status: AC
  Administered 2020-09-27: 40 mg via INTRAVENOUS

## 2020-09-27 MED ORDER — MORPHINE SULFATE (PF) 2 MG/ML IV SOLN
1.0000 mg | Freq: Once | INTRAVENOUS | Status: AC
Start: 2020-09-27 — End: 2020-09-27
  Administered 2020-09-27: 1 mg via INTRAVENOUS

## 2020-09-27 MED ORDER — MORPHINE SULFATE (PF) 2 MG/ML IV SOLN
INTRAVENOUS | Status: AC
  Filled 2020-09-27: qty 1

## 2020-09-27 NOTE — Evaluation (Signed)
Occupational Therapy Evaluation Patient Details Name: Kristin [name] MRN: [id number] DOB: 04-02-1951 Today's Date: 09/27/2020    History of Present Illness Pt is a 70 y/o F with PMH: nonobstructive CAD, severe aortic stenosis being evaluated for TAVR, chronic diastolic CHF, advanced COPD/chronic respiratory failure on 3 L home O2, history of atrial flutter on amiodarone and apixaban, OSA not on CPAP, hypothyroidism, morbid obesity, right lower lobe lung cancer status post XRT, diabetes, and recent right humeral fracture (to be immob w/ sling). Pt presented to the ED on 5/12 with respiratory distress, she was noted to be tachypneic and treated with BiPAP. Pt adm to ICU for monitoring, currently on [place].   Clinical Impression   Pt seen for OT evaluation this date in setting of acute hospitalization d/t multi-factoral COPD exacerbation. Pt reports being MOD I at baseline for fxl mobility with rollator and MOD I with use of slip on shoes for LB dressing. Pt has support from her daughter and grand daughter for IADLs such as cooking/cleaning. Pt endorses requiring increased assist for bathing/dressing from family since injuring her R UE at the end of April. Pt presents this date with decreased strength, decreased fxl activity tolerance, general deconditioning, cardiopulmonary status, and some intermittent confusion; impacting her ability to safely and efficiently perform ADLs/ADL mobility. On assessment this date, pt requires SETUP to MIN A for seated UB ADLs, MAX/TOTAL A for LB ADLs bed level or seated. MOD/MAX A for bed mobility, pt declines to attempt standing. Pt will require continued skilled OT services in acute setting and anticipate pt will require extensive rehabilitation in STR before being able to safely return to home environment. Will continue to follow.    Follow Up Recommendations  SNF    Equipment Recommendations  Tub/shower seat;3 in 1 bedside commode;Other (comment) (2ww)     Recommendations for Other Services       Precautions / Restrictions Precautions Precautions: Fall Restrictions Weight Bearing Restrictions: Yes RUE Weight Bearing: Non weight bearing      Mobility Bed Mobility Overal bed mobility: Needs Assistance Bed Mobility: Supine to Sit;Sit to Supine     Supine to sit: Mod assist;Max assist;HOB elevated Sit to supine: Max assist;Total assist;HOB elevated   General bed mobility comments: increased assist for back to bed to manage trunk/LEs    Transfers                 General transfer comment: deferred    Balance Overall balance assessment: Needs assistance Sitting-balance support: Feet supported Sitting balance-Leahy Scale: Fair Sitting balance - Comments: requires UE support to sustain static sitting                                   ADL either performed or assessed with clinical judgement   ADL Overall ADL's : Needs assistance/impaired                                       General ADL Comments: SETUP to MIN A for seated UB ADLs, MAX/TOTAL A for LB ADLs bed level or seated. MOD/MAX A for bed mobility, pt declines to attempt standing.     Vision Patient Visual Report: No change from baseline       Perception     Praxis      Pertinent Vitals/Pain Pain Assessment: Faces Faces Pain  Scale: Hurts even more Pain Location: R UE Pain Descriptors / Indicators: Tender Pain Intervention(s): Limited activity within patient's tolerance;Monitored during session;Repositioned (pillow supporting below/behind as pt not willing to wear sling)     Hand Dominance     Extremity/Trunk Assessment Upper Extremity Assessment Upper Extremity Assessment: RUE deficits/detail;LUE deficits/detail RUE: Unable to fully assess due to pain;Unable to fully assess due to immobilization LUE Deficits / Details: shld ROM only to 1/3 range, MMT of elbow, wrist and grip grossly 3+/5   Lower Extremity  Assessment Lower Extremity Assessment: Generalized weakness       Communication Communication Communication: No difficulties   Cognition Arousal/Alertness: Awake/alert Behavior During Therapy: WFL for tasks assessed/performed Overall Cognitive Status: No family/caregiver present to determine baseline cognitive functioning                                 General Comments: No family to cofirm pt's baseline. She is appropriate with following simple 1-2 step commands. Oriented to self and place and some situation (broken UE) but not oriented to current situation or temporal concepts.   General Comments       Exercises Other Exercises Other Exercises: ed re: importance of OOB activity, role of OT, safety considerations, PLB, postural exercises in sitting to improve surface area for breathing. Pt with moderate reception, will require f/u.   Shoulder Instructions      Home Living Family/patient expects to be discharged to:: Private residence Living Arrangements: Children;Other (Comment) (dtr and granddtr) Available Help at Discharge: Family;Available PRN/intermittently   Home Access: Stairs to enter Entrance Stairs-Number of Steps: 4 Entrance Stairs-Rails: None Home Layout: One level               Home Equipment: Walker - 4 wheels   Additional Comments: rollator      Prior Functioning/Environment Level of Independence: Needs assistance  Gait / Transfers Assistance Needed: Pt was MOD I with 4WW up until R UE injury at end of april, in recent weeks, was requring close SUPV/SBA from dtr/granddtr ADL's / Homemaking Assistance Needed: pt endorses basline difficulty with LB dressing, states she wore slip on shoes. States since R UE injury, she has been requiring assist for bathing/dressing from her daughter/grandtr. Family assist for IADLs at baseline including cooking/cleaning.   Comments: on 3Lnxc home O2 at all times        OT Problem List: Decreased  strength;Decreased range of motion;Decreased activity tolerance;Impaired balance (sitting and/or standing);Decreased cognition;Decreased knowledge of use of DME or AE;Decreased knowledge of precautions;Cardiopulmonary status limiting activity;Obesity;Impaired UE functional use;Pain;Increased edema      OT Treatment/Interventions: Self-care/ADL training;DME and/or AE instruction;Therapeutic activities;Balance training;Therapeutic exercise;Energy conservation;Patient/family education    OT Goals(Current goals can be found in the care plan section) Acute Rehab OT Goals Patient Stated Goal: to be in less pain OT Goal Formulation: With patient Time For Goal Achievement: 10/11/20 Potential to Achieve Goals: Good ADL Goals Pt Will Perform Grooming: with set-up;with supervision;sitting (with G static sitting balance for ~5-6 mins to increase tolerance for OOB activity) Pt Will Perform Upper Body Dressing: with min assist;sitting (with modified technique d/t  RUE immob) Pt Will Transfer to Toilet: with mod assist;with max assist;with +2 assist;stand pivot transfer;bedside commode Pt Will Perform Toileting - Clothing Manipulation and hygiene: with mod assist;with max assist;with 2+ total assist;sit to/from stand Pt/caregiver will Perform Home Exercise Program: Increased strength (core/trunk for standing balance/fall prevention and generally to improve weight  distribution)  OT Frequency: Min 2X/week   Barriers to D/C:            Co-evaluation              AM-PAC OT "6 Clicks" Daily Activity     Outcome Measure Help from another person eating meals?: A Little Help from another person taking care of personal grooming?: A Little Help from another person toileting, which includes using toliet, bedpan, or urinal?: A Lot Help from another person bathing (including washing, rinsing, drying)?: A Lot Help from another person to put on and taking off regular upper body clothing?: A Lot Help from  another person to put on and taking off regular lower body clothing?: Total 6 Click Score: 13   End of Session Equipment Utilized During Treatment: Oxygen Nurse Communication: Mobility status;Other (comment) (spO2 and HR stats (WFL throughout, pt on 45% @45LPM ))  Activity Tolerance: Patient limited by pain Patient left: in bed;with call bell/phone within reach;with bed alarm set  OT Visit Diagnosis: Unsteadiness on feet (R26.81);Muscle weakness (generalized) (M62.81);History of falling (Z91.81);Pain Pain - Right/Left: Right Pain - part of body: Arm                Time: 5361-4431 OT Time Calculation (min): 31 min Charges:  OT General Charges $OT Visit: 1 Visit OT Evaluation $OT Eval Moderate Complexity: 1 Mod OT Treatments $Self Care/Home Management : 8-22 mins $Therapeutic Activity: 8-22 mins  Gerrianne Scale, MS, OTR/L ascom [phone] 09/27/20, 6:48 PM

## 2020-09-27 NOTE — Progress Notes (Signed)
PT Cancellation Note  Patient Details Name: Kristin [name] MRN: [id number] DOB: 30-May-1950   Cancelled Treatment:    Reason Eval/Treat Not Completed: Other (comment) Per chart, pt with eventful night of vomiting episode followed by desaturations. Pt noted to have up trending troponins & with elevated K+ (5.4). Will hold PT evaluation at this time & will f/u as able & as pt is medically appropriate.   [name], PT, DPT 09/27/20, 7:39 AM   [name] 09/27/2020, 7:38 AM

## 2020-09-27 NOTE — Progress Notes (Signed)
Progress Note  Patient Name: Kristin [name] Date of Encounter: 09/27/2020  [facility] Cardiologist: Ida Rogue, MD   Subjective   Sleepy but arousable.  On high flow oxygen.  No acute complaints.  Inpatient Medications    Scheduled Meds: . amiodarone  200 mg Oral Daily  . budesonide (PULMICORT) nebulizer solution  0.5 mg Nebulization BID  . chlorhexidine  15 mL Mouth Rinse BID  . Chlorhexidine Gluconate Cloth  6 each Topical Daily  . diltiazem  60 mg Oral Q8H  . docusate sodium  100 mg Oral BID  . enoxaparin (LOVENOX) injection  125 mg Subcutaneous Q12H  . feeding supplement  237 mL Oral TID BM  . furosemide  40 mg Intravenous BID  . insulin aspart  0-20 Units Subcutaneous TID WC  . insulin aspart  0-5 Units Subcutaneous QHS  . ipratropium-albuterol  3 mL Nebulization Q4H  . levothyroxine  75 mcg Oral Q0600  . mouth rinse  15 mL Mouth Rinse q12n4p  . methylPREDNISolone (SOLU-MEDROL) injection  60 mg Intravenous Q12H  . multivitamin with minerals  1 tablet Oral Daily  . polyethylene glycol  17 g Oral Daily  . sodium chloride flush  3 mL Intravenous Q12H  . tiotropium  18 mcg Inhalation Daily   Continuous Infusions: . sodium chloride    . ampicillin-sulbactam (UNASYN) IV 3 g (09/27/20 0714)  . famotidine (PEPCID) IV 100 mL/hr at 09/27/20 0400   PRN Meds: sodium chloride, acetaminophen, albuterol, ALPRAZolam, ondansetron (ZOFRAN) IV, prochlorperazine, sodium chloride flush   Vital Signs    Vitals:   09/27/20 0752 09/27/20 0900 09/27/20 1000 09/27/20 1022  BP:  133/63 (!) 144/68   Pulse:  87 (!) 142 82  Resp:  16 (!) 23 18  Temp:      TempSrc:      SpO2: 95% 93% 93%   Weight:      Height:        Intake/Output Summary (Last 24 hours) at 09/27/2020 1215 Last data filed at 09/27/2020 1000 Gross per 24 hour  Intake 103.36 ml  Output 2600 ml  Net -2496.64 ml   Last 3 Weights 09/27/2020 09/26/2020 09/26/2020  Weight (lbs) 255 lb 1.2 oz 262 lb 5.6 oz 271 lb  2.7 oz  Weight (kg) 115.7 kg 119 kg 123 kg      Telemetry    Atrial fibrillation- Personally Reviewed  ECG    None new- Personally Reviewed  Physical Exam   GEN:  Obese, no acute distress.   Neck: No JVD Cardiac: RRR, 2/6 systolic murmur at the base, no rubs, or gallops.  Respiratory: Clear to auscultation bilaterally. GI: Soft, nontender, non-distended  MS:  1+ edema; No deformity. Neuro:  Nonfocal  Psych: Normal affect   Labs    High Sensitivity Troponin:   Recent Labs  Lab 09/25/20 2158 09/25/20 2357 09/27/20 0313 09/27/20 0502  TROPONINIHS 12 11 18* 19*      Chemistry Recent Labs  Lab 09/25/20 2158 09/26/20 0150 09/26/20 0425 09/27/20 0313  NA 126* 126* 128* 131*  K 5.9* 5.8* 5.1 5.4*  CL 86* 87* 89* 88*  CO2 31 29 30  33*  GLUCOSE 155* 169* 195* 181*  BUN 58* 57* 56* 56*  CREATININE 2.16* 2.05* 1.98* 1.69*  CALCIUM 8.0* 7.9* 7.9* 8.8*  PROT 6.9  --   --  7.3  ALBUMIN 3.0*  --   --  3.1*  AST 32  --   --  27  ALT 24  --   --  24  ALKPHOS 42  --   --  44  BILITOT 0.5  --   --  0.6  GFRNONAA 24* 26* 27* 33*  ANIONGAP 9 10 9 10      Hematology Recent Labs  Lab 09/25/20 2158 09/26/20 0425 09/27/20 0313  WBC 13.8* 10.5 13.4*  RBC 3.68* 3.47* 3.91  HGB 10.8* 10.5* 11.4*  HCT 34.0* 31.9* 36.5  MCV 92.4 91.9 93.4  MCH 29.3 30.3 29.2  MCHC 31.8 32.9 31.2  RDW 14.6 14.6 14.6  PLT 352 311 364    BNP Recent Labs  Lab 09/25/20 2157 09/27/20 0313  BNP 1,972.2* 1,737.3*     DDimer No results for input(s): DDIMER in the last 168 hours.   Radiology    DG Chest 1 View  Result Date: 09/27/2020 CLINICAL DATA:  Vomiting, aspiration EXAM: CHEST  1 VIEW COMPARISON:  Sep 25, 2020 FINDINGS: Similar cardiomegaly. Increased bilateral peribronchial and interstitial thickening. No visible pleural effusion or pneumothorax. Proximal right humerus fracture again visualized. Thoracic spondylosis. IMPRESSION: Cardiomegaly with increased bilateral  peribronchial and interstitial thickening, which may be related to increasing pulmonary edema and/or reported aspiration. Electronically Signed   By: Dahlia Bailiff MD   On: 09/27/2020 03:03   DG Abd 1 View  Result Date: 09/27/2020 CLINICAL DATA:  Vomiting/aspiration EXAM: ABDOMEN - 1 VIEW COMPARISON:  CT April 08, 2020. FINDINGS: Gaseous distension of the stomach with bowel gas visualized to the level of the rectum. No pathologically dilated loops of small or large bowel visualized. IMPRESSION: Gaseous distension of the stomach. No pathologically dilated loops of small or large bowel visualized. Electronically Signed   By: Dahlia Bailiff MD   On: 09/27/2020 03:06   CT HEAD WO CONTRAST  Result Date: 09/26/2020 CLINICAL DATA:  70 year old female with altered mental status. Lung cancer. EXAM: CT HEAD WITHOUT CONTRAST TECHNIQUE: Contiguous axial images were obtained from the base of the skull through the vertex without intravenous contrast. COMPARISON:  Brain MRI dated 05/26/2020. FINDINGS: Evaluation of this exam is limited due to motion artifact. Brain: The ventricles and sulci are appropriate size for patient's age. Focal area of white matter hypodensity in the right corona radiata consistent with chronic microvascular ischemic changes and seen on the prior MRI. There is no acute intracranial hemorrhage. No mass effect or midline shift. No extra-axial fluid collection. Vascular: No hyperdense vessel or unexpected calcification. Skull: Normal. Negative for fracture or focal lesion. Sinuses/Orbits: No acute finding. Other: None IMPRESSION: 1. No acute intracranial pathology. 2. Chronic microvascular ischemic changes. Electronically Signed   By: Anner Crete M.D.   On: 09/26/2020 03:06   US Venous Img Lower Bilateral (DVT)  Result Date: 09/26/2020 CLINICAL DATA:  Acute respiratory failure. Hypoxemia. Tobacco use. Malignancy of the lung. Hormone therapy and anticoagulation therapy. EXAM: BILATERAL  LOWER EXTREMITY VENOUS DOPPLER ULTRASOUND TECHNIQUE: Gray-scale sonography with graded compression, as well as color Doppler and duplex ultrasound were performed to evaluate the lower extremity deep venous systems from the level of the common femoral vein and including the common femoral, femoral, profunda femoral, popliteal and calf veins including the posterior tibial, peroneal and gastrocnemius veins when visible. The superficial great saphenous vein was also interrogated. Spectral Doppler was utilized to evaluate flow at rest and with distal augmentation maneuvers in the common femoral, femoral and popliteal veins. COMPARISON:  None. FINDINGS: RIGHT LOWER EXTREMITY Common Femoral Vein: No evidence of thrombus. Normal compressibility, respiratory phasicity and response to augmentation. Saphenofemoral Junction: No evidence of thrombus. Normal  compressibility and flow on color Doppler imaging. Profunda Femoral Vein: No evidence of thrombus. Normal compressibility and flow on color Doppler imaging. Femoral Vein: No evidence of thrombus. Normal compressibility, respiratory phasicity and response to augmentation. Popliteal Vein: No evidence of thrombus. Normal compressibility, respiratory phasicity and response to augmentation. Calf Veins: No evidence of thrombus. Normal compressibility and flow on color Doppler imaging. Superficial Great Saphenous Vein: No evidence of thrombus. Normal compressibility. Venous Reflux:  None. Other Findings:  None. LEFT LOWER EXTREMITY Common Femoral Vein: No evidence of thrombus. Normal compressibility, respiratory phasicity and response to augmentation. Saphenofemoral Junction: No evidence of thrombus. Normal compressibility and flow on color Doppler imaging. Profunda Femoral Vein: No evidence of thrombus. Normal compressibility and flow on color Doppler imaging. Femoral Vein: No evidence of thrombus. Normal compressibility, respiratory phasicity and response to augmentation.  Popliteal Vein: No evidence of thrombus. Normal compressibility, respiratory phasicity and response to augmentation. Calf Veins: No evidence of thrombus. Normal compressibility and flow on color Doppler imaging. Superficial Great Saphenous Vein: No evidence of thrombus. Normal compressibility. Venous Reflux:  None. Other Findings:  None. IMPRESSION: No evidence of deep venous thrombosis in either lower extremity. Electronically Signed   By: Iven Finn M.D.   On: 09/26/2020 06:39   DG Chest Portable 1 View  Result Date: 09/25/2020 CLINICAL DATA:  Shortness of breath. EXAM: PORTABLE CHEST 1 VIEW COMPARISON:  Chest radiograph 05/29/2020 FINDINGS: Cardiomegaly with equivocal progression. Increasing peribronchial and interstitial thickening. No pneumothorax or large pleural effusion. Previous nodule in the superior segment of the right lower lobe is not well seen on the current exam. Right proximal humerus fracture, seen on recent humerus radiographs. IMPRESSION: Cardiomegaly with equivocal progression. Increasing peribronchial and interstitial thickening may be pulmonary edema or infection. Overall findings suggest CHF. Electronically Signed   By: Keith Rake M.D.   On: 09/25/2020 22:19   ECHOCARDIOGRAM COMPLETE  Result Date: 09/26/2020    ECHOCARDIOGRAM REPORT   Patient Name:   Kristin [name] Date of Exam: 09/26/2020 Medical Rec #:  [id number]     Height:       62.0 in Accession #:    [id number]    Weight:       271.2 lb Date of Birth:  [birth year]    BSA:          2.176 m Patient Age:    70 years      BP:           145/62 mmHg Patient Gender: F             HR:           80 bpm. Exam Location:  [facility] Procedure: 2D Echo, Cardiac Doppler and Color Doppler Indications:     CHF -acute diastolic E31.54  History:         Patient has prior history of Echocardiogram examinations, most                  recent 03/29/2020. CHF, COPD; Risk Factors:Diabetes and Sleep                  Apnea.  Sonographer:     Sherrie Sport RDCS (AE) Referring Phys:  [id number] Arvil Chaco Diagnosing Phys: Nelva Bush MD  Sonographer Comments: Technically difficult study due to poor echo windows, no apical window and no subcostal window. Image acquisition challenging due to COPD. IMPRESSIONS  1. Left ventricular ejection fraction, by estimation, is >55%. The left ventricle has normal  function. The left ventricle has no regional wall motion abnormalities. There is mild left ventricular hypertrophy. Left ventricular diastolic function could not be evaluated.  2. Right ventricular systolic function is normal. The right ventricular size is mildly enlarged.  3. The mitral valve was not well visualized. Unable to accurately assess mitral valve regurgitation. Moderate mitral annular calcification.  4. The aortic valve was not well visualized. Aortic valve regurgitation not well assessed. Aortic valve gradient unable to be assessed. FINDINGS  Left Ventricle: Left ventricular ejection fraction, by estimation, is >55%. The left ventricle has normal function. The left ventricle has no regional wall motion abnormalities. The left ventricular internal cavity size was normal in size. There is mild  left ventricular hypertrophy. Left ventricular diastolic function could not be evaluated. Right Ventricle: The right ventricular size is mildly enlarged. No increase in right ventricular wall thickness. Right ventricular systolic function is normal. Left Atrium: Left atrial size was not well visualized. Right Atrium: Right atrial size was not well visualized. Pericardium: The pericardium was not well visualized. Mitral Valve: The mitral valve was not well visualized. Moderate mitral annular calcification. Unable to accurately assess mitral valve regurgitation. Tricuspid Valve: The tricuspid valve is not well visualized. Tricuspid valve regurgitation is trivial. Aortic Valve: The aortic valve was not well visualized. Aortic valve regurgitation not well  assessed. Aortic valve gradient unable to be assessed. Pulmonic Valve: The pulmonic valve was not well visualized. Aorta: The aortic root is normal in size and structure. IAS/Shunts: The interatrial septum was not assessed.  LEFT VENTRICLE PLAX 2D LVIDd:         3.98 cm LVIDs:         2.73 cm LV PW:         1.36 cm LV IVS:        1.24 cm LVOT diam:     2.00 cm LVOT Area:     3.14 cm  LEFT ATRIUM         Index LA diam:    4.20 cm 1.93 cm/m   AORTA Ao Root diam: 2.50 cm  SHUNTS Systemic Diam: 2.00 cm Nelva Bush MD Electronically signed by Nelva Bush MD Signature Date/Time: 09/26/2020/6:11:15 PM    Final     Cardiac Studies   TTE 09/26/20  1. Left ventricular ejection fraction, by estimation, is >55%. The left  ventricle has normal function. The left ventricle has no regional wall  motion abnormalities. There is mild left ventricular hypertrophy. Left  ventricular diastolic function could  not be evaluated.  2. Right ventricular systolic function is normal. The right ventricular  size is mildly enlarged.  3. The mitral valve was not well visualized. Unable to accurately assess  mitral valve regurgitation. Moderate mitral annular calcification.  4. The aortic valve was not well visualized. Aortic valve regurgitation  not well assessed. Aortic valve gradient unable to be assessed.   Patient Profile     70 y.o. female with a history of small cell lung cancer, diastolic heart failure, severe aortic stenosis, paroxysmal atrial fibrillation with a posttermination pauses, hypertension, chronic respiratory failure, COPD, OSA who presented to the hospital with shortness of breath, found to have a COPD and heart failure exacerbation.  Assessment & Plan    1.  Hypoxic and hypercapnic respiratory failure: Likely due to an exacerbation of COPD as well as diastolic heart failure.  Her creatinine is also elevated indicating acute renal failure.  Fortunately her creatinine has improved with  diuresis.  She is net -  4.7 L.  At this point, we Jamarie Mussa continue with diuresis.  2.  Atrial fibrillation: Currently on amiodarone as an outpatient.  Corben Auzenne increase to 400 mg twice daily.  Hopefully this Maddelyn Rocca help to control her heart rates.  She is currently not anticoagulated while in the hospital.  If she has prolonged episodes of atrial fibrillation, anticoagulation would be beneficial.  Her atrial fibrillation currently is likely exacerbated by her respiratory and diastolic heart failure..  This Tiny Chaudhary likely improve as she undergoes diuresis.  3.  Junctional rhythm with posttermination pauses: Avoid AV nodal blocking agents.  4.  Moderate aortic stenosis: Currently undergoing TAVR work-up.  We Sydny Schnitzler continue to monitor.       For questions or updates, please contact [place] Please consult [email or web address] for contact info under        Signed, Josias Tomerlin Meredith Leeds, MD  09/27/2020, 12:15 PM

## 2020-09-27 NOTE — Progress Notes (Addendum)
PROGRESS NOTE    Kristin [name]  PYK:[id number] DOB: [birth year] DOA: 09/25/2020 PCP: [facility]  Brief Narrative: Kristin [name] is a chronically ill morbidly obese female with history of nonobstructive CAD, severe aortic stenosis being evaluated for TAVR, chronic diastolic CHF, advanced COPD/chronic respiratory failure on 3 L home O2, history of atrial flutter on amiodarone and apixaban, OSA not on CPAP, hypothyroidism, morbid obesity, right lower lobe lung cancer status post XRT, diabetes, right humeral fracture presented to the ED on 5/12 with respiratory distress, she was noted to be tachypneic and treated with BiPAP briefly she was admitted to the ICU, chest x-ray was concerning for pulmonary vascular congestion, COVID and flu PCR were negative, she was treated with IV Lasix duo nebs and steroids also  received a dose of Narcan. -Transferred to [facility] service today 5/14   Assessment & Plan:   Acute on chronic hypoxic and hypercarbic respiratory failure Acute on chronic diastolic CHF COPD exacerbation Untreated OSA/OHS Aspiration pneumonia -Multifactorial respiratory failure likely worsened by recent opiate use -Treated with BiPAP on admission and then weaned down to 4 L, overnight-5/14, vomited and had worsening hypoxia concerning for aspiration, started on Unasyn and high flow nasal cannula -Currently on 45% FiO2 -PCCM following -Also had palliative care meeting yesterday, now DNR -High risk of further decompensation, attempted to reach daughter Kristin [name] x2  Acute diastolic CHF Severe aortic stenosis -Continue IV Lasix today -Being evaluated as outpatient for TAVR work-up which has been deferred  Paroxysmal atrial flutter/A. Fib with RVR and bradycardia -Was bradycardic on admission requiring atropine -Coreg and amiodarone were held initially -Amiodarone resumed -Currently on full dose Lovenox -Add p.o. Cardizem  Acute metabolic encephalopathy -Multifactorial,  chronically hypercarbic from advanced COPD and untreated OSA/OHS -Now off BiPAP due to vomiting/aspiration event overnight -DNR, discontinue fentanyl patch  AKI on CKD 3 Hyperkalemia -Monitor closely with diuresis,  Closed fracture of proximal end of right humerus 4/29 -Continue shoulder immobilizer -Tylenol as needed  Type 2 diabetes mellitus  Non-small cell lung cancer Heavy tobacco abuse -Recently diagnosed, treated with XRT in 3/22  Hypothyroidism -Continue Synthroid  DVT prophylaxis: Full dose Lovenox Code Status: DNR Family Communication: No family at bedside, attempted to call patient's daughter Kristin [name] x2, mailbox is full Disposition Plan:  Status is: Inpatient  Remains inpatient appropriate because:Inpatient level of care appropriate due to severity of illness   Dispo: The patient is from: Home              Anticipated d/c is to: SNF              Patient currently is not medically stable to d/c.   Difficult to place patient No   Consultants:   PCCM   Procedures:   Antimicrobials:    Subjective: Overnight had an episode of vomiting, with worsening hypoxia, aspiration suspected and started on IV Unasyn, now on 45% FiO2 via high flow  Objective: Vitals:   09/27/20 0752 09/27/20 0900 09/27/20 1000 09/27/20 1022  BP:  133/63 (!) 144/68   Pulse:  87 (!) 142 82  Resp:  16 (!) 23 18  Temp:      TempSrc:      SpO2: 95% 93% 93%   Weight:      Height:        Intake/Output Summary (Last 24 hours) at 09/27/2020 1151 Last data filed at 09/27/2020 1000 Gross per 24 hour  Intake 453.36 ml  Output 3600 ml  Net -3146.64 ml  Filed Weights   09/26/20 0412 09/26/20 1200 09/27/20 0358  Weight: 123 kg 119 kg 115.7 kg    Examination:  General exam: Chronically ill morbidly obese lying in bed, somnolent but arousable, oriented to self and place CVS: S1-S2, irregularly irregular rhythm, systolic ejection murmur noted Lungs: Diminished breath sounds, few basilar  rales  Abdomen: Obese, soft, nontender, massive abdominal pannus, bowel sounds present Extremities: No edema  Skin: No rashes on exposed skin Psych: Flat affect  Data Reviewed:   CBC: Recent Labs  Lab 09/25/20 2158 09/26/20 0425 09/27/20 0313  WBC 13.8* 10.5 13.4*  NEUTROABS 12.2*  --  12.8*  HGB 10.8* 10.5* 11.4*  HCT 34.0* 31.9* 36.5  MCV 92.4 91.9 93.4  PLT 352 311 643   Basic Metabolic Panel: Recent Labs  Lab 09/25/20 2158 09/26/20 0150 09/26/20 0425 09/27/20 0313  NA 126* 126* 128* 131*  K 5.9* 5.8* 5.1 5.4*  CL 86* 87* 89* 88*  CO2 31 29 30  33*  GLUCOSE 155* 169* 195* 181*  BUN 58* 57* 56* 56*  CREATININE 2.16* 2.05* 1.98* 1.69*  CALCIUM 8.0* 7.9* 7.9* 8.8*  MG  --  1.9  --  2.1  PHOS  --   --   --  5.2*   GFR: Estimated Creatinine Clearance: 38.4 mL/min (A) (by C-G formula based on SCr of 1.69 mg/dL (H)). Liver Function Tests: Recent Labs  Lab 09/25/20 2158 09/27/20 0313  AST 32 27  ALT 24 24  ALKPHOS 42 44  BILITOT 0.5 0.6  PROT 6.9 7.3  ALBUMIN 3.0* 3.1*   No results for input(s): LIPASE, AMYLASE in the last 168 hours. No results for input(s): AMMONIA in the last 168 hours. Coagulation Profile: No results for input(s): INR, PROTIME in the last 168 hours. Cardiac Enzymes: No results for input(s): CKTOTAL, CKMB, CKMBINDEX, TROPONINI in the last 168 hours. BNP (last 3 results) No results for input(s): PROBNP in the last 8760 hours. HbA1C: Recent Labs    09/26/20 0126  HGBA1C 6.2*   CBG: Recent Labs  Lab 09/26/20 1132 09/26/20 1544 09/26/20 2104 09/27/20 0700 09/27/20 1100  GLUCAP 163* 143* 118* 169* 151*   Lipid Profile: No results for input(s): CHOL, HDL, LDLCALC, TRIG, CHOLHDL, LDLDIRECT in the last 72 hours. Thyroid Function Tests: No results for input(s): TSH, T4TOTAL, FREET4, T3FREE, THYROIDAB in the last 72 hours. Anemia Panel: No results for input(s): VITAMINB12, FOLATE, FERRITIN, TIBC, IRON, RETICCTPCT in the last 72  hours. Urine analysis:    Component Value Date/Time   COLORURINE YELLOW (A) 05/26/2020 1001   APPEARANCEUR CLEAR (A) 05/26/2020 1001   LABSPEC 1.017 05/26/2020 1001   PHURINE 5.0 05/26/2020 1001   GLUCOSEU NEGATIVE 05/26/2020 1001   HGBUR NEGATIVE 05/26/2020 1001   BILIRUBINUR NEGATIVE 05/26/2020 1001   KETONESUR NEGATIVE 05/26/2020 1001   PROTEINUR 30 (A) 05/26/2020 1001   NITRITE NEGATIVE 05/26/2020 1001   LEUKOCYTESUR NEGATIVE 05/26/2020 1001   Sepsis Labs: @LABRCNTIP (procalcitonin:4,lacticidven:4)  ) Recent Results (from the past 240 hour(s))  Resp Panel by RT-PCR (Flu A&B, Covid) Nasopharyngeal Swab     Status: None   Collection Time: 09/25/20  9:57 PM   Specimen: Nasopharyngeal Swab; Nasopharyngeal(NP) swabs in vial transport medium  Result Value Ref Range Status   SARS Coronavirus 2 by RT PCR NEGATIVE NEGATIVE Final    Comment: (NOTE) SARS-CoV-2 target nucleic acids are NOT DETECTED.  The SARS-CoV-2 RNA is generally detectable in upper respiratory specimens during the acute phase of infection. The lowest concentration of  SARS-CoV-2 viral copies this assay can detect is 138 copies/mL. A negative result does not preclude SARS-Cov-2 infection and should not be used as the sole basis for treatment or other patient management decisions. A negative result may occur with  improper specimen collection/handling, submission of specimen other than nasopharyngeal swab, presence of viral mutation(s) within the areas targeted by this assay, and inadequate number of viral copies(<138 copies/mL). A negative result must be combined with clinical observations, patient history, and epidemiological information. The expected result is Negative.  Fact Sheet for Patients:  EntrepreneurPulse.com.au  Fact Sheet for Healthcare Providers:  IncredibleEmployment.be  This test is no t yet approved or cleared by the [place] FDA and  has been  authorized for detection and/or diagnosis of SARS-CoV-2 by FDA under an Emergency Use Authorization (EUA). This EUA will remain  in effect (meaning this test can be used) for the duration of the COVID-19 declaration under Section 564(b)(1) of the Act, 21 U.S.C.section 360bbb-3(b)(1), unless the authorization is terminated  or revoked sooner.       Influenza A by PCR NEGATIVE NEGATIVE Final   Influenza B by PCR NEGATIVE NEGATIVE Final    Comment: (NOTE) The Xpert Xpress SARS-CoV-2/FLU/RSV plus assay is intended as an aid in the diagnosis of influenza from Nasopharyngeal swab specimens and should not be used as a sole basis for treatment. Nasal washings and aspirates are unacceptable for Xpert Xpress SARS-CoV-2/FLU/RSV testing.  Fact Sheet for Patients: EntrepreneurPulse.com.au  Fact Sheet for Healthcare Providers: IncredibleEmployment.be  This test is not yet approved or cleared by the [place] FDA and has been authorized for detection and/or diagnosis of SARS-CoV-2 by FDA under an Emergency Use Authorization (EUA). This EUA will remain in effect (meaning this test can be used) for the duration of the COVID-19 declaration under Section 564(b)(1) of the Act, 21 U.S.C. section 360bbb-3(b)(1), unless the authorization is terminated or revoked.  Performed at [facility], [address] [place], [place] [address]   MRSA PCR Screening     Status: None   Collection Time: 09/26/20  3:40 AM   Specimen: Nasopharyngeal  Result Value Ref Range Status   MRSA by PCR NEGATIVE NEGATIVE Final    Comment:        The GeneXpert MRSA Assay (FDA approved for NASAL specimens only), is one component of a comprehensive MRSA colonization surveillance program. It is not intended to diagnose MRSA infection nor to guide or monitor treatment for MRSA infections. Performed at [facility], [address], [place], [place] [address]           Radiology Studies: DG Chest 1 View  Result Date: 09/27/2020 CLINICAL DATA:  Vomiting, aspiration EXAM: CHEST  1 VIEW COMPARISON:  Sep 25, 2020 FINDINGS: Similar cardiomegaly. Increased bilateral peribronchial and interstitial thickening. No visible pleural effusion or pneumothorax. Proximal right humerus fracture again visualized. Thoracic spondylosis. IMPRESSION: Cardiomegaly with increased bilateral peribronchial and interstitial thickening, which may be related to increasing pulmonary edema and/or reported aspiration. Electronically Signed   By: Dahlia Bailiff MD   On: 09/27/2020 03:03   DG Abd 1 View  Result Date: 09/27/2020 CLINICAL DATA:  Vomiting/aspiration EXAM: ABDOMEN - 1 VIEW COMPARISON:  CT April 08, 2020. FINDINGS: Gaseous distension of the stomach with bowel gas visualized to the level of the rectum. No pathologically dilated loops of small or large bowel visualized. IMPRESSION: Gaseous distension of the stomach. No pathologically dilated loops of small or large bowel visualized. Electronically Signed   By:  Dahlia Bailiff MD   On: 09/27/2020 03:06   CT HEAD WO CONTRAST  Result Date: 09/26/2020 CLINICAL DATA:  70 year old female with altered mental status. Lung cancer. EXAM: CT HEAD WITHOUT CONTRAST TECHNIQUE: Contiguous axial images were obtained from the base of the skull through the vertex without intravenous contrast. COMPARISON:  Brain MRI dated 05/26/2020. FINDINGS: Evaluation of this exam is limited due to motion artifact. Brain: The ventricles and sulci are appropriate size for patient's age. Focal area of white matter hypodensity in the right corona radiata consistent with chronic microvascular ischemic changes and seen on the prior MRI. There is no acute intracranial hemorrhage. No mass effect or midline shift. No extra-axial fluid collection. Vascular: No hyperdense vessel or unexpected calcification. Skull: Normal. Negative for fracture or focal lesion.  Sinuses/Orbits: No acute finding. Other: None IMPRESSION: 1. No acute intracranial pathology. 2. Chronic microvascular ischemic changes. Electronically Signed   By: Anner Crete M.D.   On: 09/26/2020 03:06   US Venous Img Lower Bilateral (DVT)  Result Date: 09/26/2020 CLINICAL DATA:  Acute respiratory failure. Hypoxemia. Tobacco use. Malignancy of the lung. Hormone therapy and anticoagulation therapy. EXAM: BILATERAL LOWER EXTREMITY VENOUS DOPPLER ULTRASOUND TECHNIQUE: Gray-scale sonography with graded compression, as well as color Doppler and duplex ultrasound were performed to evaluate the lower extremity deep venous systems from the level of the common femoral vein and including the common femoral, femoral, profunda femoral, popliteal and calf veins including the posterior tibial, peroneal and gastrocnemius veins when visible. The superficial great saphenous vein was also interrogated. Spectral Doppler was utilized to evaluate flow at rest and with distal augmentation maneuvers in the common femoral, femoral and popliteal veins. COMPARISON:  None. FINDINGS: RIGHT LOWER EXTREMITY Common Femoral Vein: No evidence of thrombus. Normal compressibility, respiratory phasicity and response to augmentation. Saphenofemoral Junction: No evidence of thrombus. Normal compressibility and flow on color Doppler imaging. Profunda Femoral Vein: No evidence of thrombus. Normal compressibility and flow on color Doppler imaging. Femoral Vein: No evidence of thrombus. Normal compressibility, respiratory phasicity and response to augmentation. Popliteal Vein: No evidence of thrombus. Normal compressibility, respiratory phasicity and response to augmentation. Calf Veins: No evidence of thrombus. Normal compressibility and flow on color Doppler imaging. Superficial Great Saphenous Vein: No evidence of thrombus. Normal compressibility. Venous Reflux:  None. Other Findings:  None. LEFT LOWER EXTREMITY Common Femoral Vein: No  evidence of thrombus. Normal compressibility, respiratory phasicity and response to augmentation. Saphenofemoral Junction: No evidence of thrombus. Normal compressibility and flow on color Doppler imaging. Profunda Femoral Vein: No evidence of thrombus. Normal compressibility and flow on color Doppler imaging. Femoral Vein: No evidence of thrombus. Normal compressibility, respiratory phasicity and response to augmentation. Popliteal Vein: No evidence of thrombus. Normal compressibility, respiratory phasicity and response to augmentation. Calf Veins: No evidence of thrombus. Normal compressibility and flow on color Doppler imaging. Superficial Great Saphenous Vein: No evidence of thrombus. Normal compressibility. Venous Reflux:  None. Other Findings:  None. IMPRESSION: No evidence of deep venous thrombosis in either lower extremity. Electronically Signed   By: Iven Finn M.D.   On: 09/26/2020 06:39   DG Chest Portable 1 View  Result Date: 09/25/2020 CLINICAL DATA:  Shortness of breath. EXAM: PORTABLE CHEST 1 VIEW COMPARISON:  Chest radiograph 05/29/2020 FINDINGS: Cardiomegaly with equivocal progression. Increasing peribronchial and interstitial thickening. No pneumothorax or large pleural effusion. Previous nodule in the superior segment of the right lower lobe is not well seen on the current exam. Right proximal humerus fracture, seen on  recent humerus radiographs. IMPRESSION: Cardiomegaly with equivocal progression. Increasing peribronchial and interstitial thickening may be pulmonary edema or infection. Overall findings suggest CHF. Electronically Signed   By: Keith Rake M.D.   On: 09/25/2020 22:19   ECHOCARDIOGRAM COMPLETE  Result Date: 09/26/2020    ECHOCARDIOGRAM REPORT   Patient Name:   KAOIR LOREE Burnsed Date of Exam: 09/26/2020 Medical Rec #:  [id number]     Height:       62.0 in Accession #:    [id number]    Weight:       271.2 lb Date of Birth:  1950/08/20    BSA:          2.176 m Patient Age:     21 years      BP:           145/62 mmHg Patient Gender: F             HR:           80 bpm. Exam Location:  [facility] Procedure: 2D Echo, Cardiac Doppler and Color Doppler Indications:     CHF -acute diastolic J17.91  History:         Patient has prior history of Echocardiogram examinations, most                  recent 03/29/2020. CHF, COPD; Risk Factors:Diabetes and Sleep                  Apnea.  Sonographer:     Sherrie Sport RDCS (AE) Referring Phys:  [id number] Arvil Chaco Diagnosing Phys: Nelva Bush MD  Sonographer Comments: Technically difficult study due to poor echo windows, no apical window and no subcostal window. Image acquisition challenging due to COPD. IMPRESSIONS  1. Left ventricular ejection fraction, by estimation, is >55%. The left ventricle has normal function. The left ventricle has no regional wall motion abnormalities. There is mild left ventricular hypertrophy. Left ventricular diastolic function could not be evaluated.  2. Right ventricular systolic function is normal. The right ventricular size is mildly enlarged.  3. The mitral valve was not well visualized. Unable to accurately assess mitral valve regurgitation. Moderate mitral annular calcification.  4. The aortic valve was not well visualized. Aortic valve regurgitation not well assessed. Aortic valve gradient unable to be assessed. FINDINGS  Left Ventricle: Left ventricular ejection fraction, by estimation, is >55%. The left ventricle has normal function. The left ventricle has no regional wall motion abnormalities. The left ventricular internal cavity size was normal in size. There is mild  left ventricular hypertrophy. Left ventricular diastolic function could not be evaluated. Right Ventricle: The right ventricular size is mildly enlarged. No increase in right ventricular wall thickness. Right ventricular systolic function is normal. Left Atrium: Left atrial size was not well visualized. Right Atrium: Right atrial size was not  well visualized. Pericardium: The pericardium was not well visualized. Mitral Valve: The mitral valve was not well visualized. Moderate mitral annular calcification. Unable to accurately assess mitral valve regurgitation. Tricuspid Valve: The tricuspid valve is not well visualized. Tricuspid valve regurgitation is trivial. Aortic Valve: The aortic valve was not well visualized. Aortic valve regurgitation not well assessed. Aortic valve gradient unable to be assessed. Pulmonic Valve: The pulmonic valve was not well visualized. Aorta: The aortic root is normal in size and structure. IAS/Shunts: The interatrial septum was not assessed.  LEFT VENTRICLE PLAX 2D LVIDd:         3.98 cm LVIDs:  2.73 cm LV PW:         1.36 cm LV IVS:        1.24 cm LVOT diam:     2.00 cm LVOT Area:     3.14 cm  LEFT ATRIUM         Index LA diam:    4.20 cm 1.93 cm/m   AORTA Ao Root diam: 2.50 cm  SHUNTS Systemic Diam: 2.00 cm Nelva Bush MD Electronically signed by Nelva Bush MD Signature Date/Time: 09/26/2020/6:11:15 PM    Final         Scheduled Meds: . amiodarone  200 mg Oral Daily  . budesonide (PULMICORT) nebulizer solution  0.5 mg Nebulization BID  . chlorhexidine  15 mL Mouth Rinse BID  . Chlorhexidine Gluconate Cloth  6 each Topical Daily  . diltiazem  60 mg Oral Q8H  . docusate sodium  100 mg Oral BID  . enoxaparin (LOVENOX) injection  125 mg Subcutaneous Q12H  . feeding supplement  237 mL Oral TID BM  . fentaNYL  1 patch Transdermal Q72H  . furosemide  40 mg Intravenous BID  . insulin aspart  0-20 Units Subcutaneous TID WC  . insulin aspart  0-5 Units Subcutaneous QHS  . ipratropium-albuterol  3 mL Nebulization Q4H  . levothyroxine  75 mcg Oral Q0600  . mouth rinse  15 mL Mouth Rinse q12n4p  . methylPREDNISolone (SOLU-MEDROL) injection  60 mg Intravenous Q12H  . multivitamin with minerals  1 tablet Oral Daily  . polyethylene glycol  17 g Oral Daily  . sodium chloride flush  3 mL  Intravenous Q12H  . tiotropium  18 mcg Inhalation Daily   Continuous Infusions: . sodium chloride    . ampicillin-sulbactam (UNASYN) IV 3 g (09/27/20 0714)  . famotidine (PEPCID) IV 100 mL/hr at 09/27/20 0400     LOS: 2 days    Time spent: 58min  Domenic Polite, MD [facility]  09/27/2020, 11:51 AM

## 2020-09-27 NOTE — Progress Notes (Addendum)
Initially alerted by care RN that patient had a vomiting episode, followed by desaturations and was placed on BIPAP. Upon bedside assessment patient alert and responsive complaining of continued nausea and some dyspnea. Auscultated coarse crackles bilaterally. Patient taken off of BIPAP due to aspiration concerns, transitioned to NRB. During transition patient desaturated to low 80's.  Suspected Aspiration event with concerns for Aspiration pneumonia - STAT CXR & KUB (due to vomiting) - Discussed with RT, ordered HHFNC - aggressive pulmonary toileting: CPT, flutter valve - Unasyn per pharmacy protocol for aspiration coverage - compazine PRN for refractory nausea > Qtc checked and has improved to 471 from 586 - trend PCT Discussed plan of care with patient and daughter who is bedside, all questions answered at this time.  Addendum: Called bedside a second time due to new c/o of 10/10 CP and dyspnea.  BP: 91/79 (84) Chest pain - morphine x 1, avoiding nitro SL d/t marginal BP - additional lasix dose - STAT troponin & AM labs - STAT EKG I, Domingo Pulse Rust-Chester, AGACNP-BC, personally viewed and interpreted this ECG. EKG Interpretation Date: 09/27/20 EKG Time: 02:53 Rate: 103 Rhythm: NSR QRS Axis:  normal Intervals: mildly prolonged Qtc (improved since admit: 469), possible LAE ST/T Wave abnormalities: new inferior T wave inversions & mild ST depression in leads: V4-V6 Narrative Interpretation: NSR with ischemic changes but no STE  Patient's [place] was deferred this admission due to [facility] per cardiology's recommendations. CP improved with morphine PRN. Will wait for troponin results.   Domingo Pulse Rust-Chester, AGACNP-BC Acute Care Nurse Practitioner [facility]   [phone] / [phone] Please see Amion for pager details.

## 2020-09-27 NOTE — Progress Notes (Signed)
Chronic Atrial Fibrillation Junctional Rhythm with post termination pauses Patient taking amiodarone outpatient, dose increased today by cardiology to 400 mg BID. Alerted by nursing that the patient had a bradycardic episode earlier.  - Discontinued diltiazem Q 6 due to Cardiology recommendations to avoid AV nodal blocking agents. Discussed with Jonny Ruiz, NP with [facility] service who agreed with plan. - continuous cardiac monitoring, will continue to monitor patient closely   Domingo Pulse Rust-[place], AGACNP-BC Acute Care Nurse Practitioner [place]   [phone] / [phone] Please see Amion for pager details.

## 2020-09-27 NOTE — Progress Notes (Signed)
Spoke with Dr. Jacinta Shoe this am. Patient confused, lethargic but does arouse when stimulated. Patients heart rate into 140-150 SVT when stimulated then comes back down into the 80's. In and out of afib per previous shift. Per MD ordered ABG and Cardizem. At this time no new additional orders. Continue to monitor.

## 2020-09-27 NOTE — Progress Notes (Signed)
Pharmacy Antibiotic Note  Kristin [name] is a 70 y.o. female admitted on 09/25/2020 with pneumonia.  Pharmacy has been consulted for Unasyn dosing.  Plan: Unasyn 3 gm IV Q6H ordered to start on 5/14 @ 0200.   Height: 5\' 2"  (157.5 cm) Weight: 119 kg (262 lb 5.6 oz) IBW/kg (Calculated) : 50.1  Temp (24hrs), Avg:97.6 F (36.4 C), Min:97 F (36.1 C), Max:98.2 F (36.8 C)  Recent Labs  Lab 09/25/20 2158 09/26/20 0150 09/26/20 0425  WBC 13.8*  --  10.5  CREATININE 2.16* 2.05* 1.98*    Estimated Creatinine Clearance: 33.4 mL/min (A) (by C-G formula based on SCr of 1.98 mg/dL (H)).    Allergies  Allergen Reactions  . Altace [Ramipril] Swelling    Antimicrobials this admission:   >>    >>   Dose adjustments this admission:   Microbiology results:  BCx:   UCx:    Sputum:    MRSA PCR:   Thank you for allowing pharmacy to be a part of this patient's care.  Dene Nazir D 09/27/2020 2:04 AM

## 2020-09-27 NOTE — Progress Notes (Signed)
Patient alert this afternoon with intermittent confusion . No complaints of pain. Earlier during the day patient became bradycaridic, Afib, and SVT 150's. Cardizem ordered and heart rate has been in the 70-80's. Patient tolerating diet, foley intact. Physical therapy in to work with patient. Updated family, continue to assess.

## 2020-09-28 DIAGNOSIS — I350 Nonrheumatic aortic (valve) stenosis: Secondary | ICD-10-CM | POA: Diagnosis not present

## 2020-09-28 DIAGNOSIS — J9621 Acute and chronic respiratory failure with hypoxia: Secondary | ICD-10-CM | POA: Diagnosis not present

## 2020-09-28 DIAGNOSIS — I5033 Acute on chronic diastolic (congestive) heart failure: Secondary | ICD-10-CM | POA: Diagnosis not present

## 2020-09-28 DIAGNOSIS — J9601 Acute respiratory failure with hypoxia: Secondary | ICD-10-CM | POA: Diagnosis not present

## 2020-09-28 DIAGNOSIS — J441 Chronic obstructive pulmonary disease with (acute) exacerbation: Secondary | ICD-10-CM | POA: Diagnosis not present

## 2020-09-28 DIAGNOSIS — I4891 Unspecified atrial fibrillation: Secondary | ICD-10-CM | POA: Diagnosis not present

## 2020-09-28 DIAGNOSIS — J9622 Acute and chronic respiratory failure with hypercapnia: Secondary | ICD-10-CM | POA: Diagnosis not present

## 2020-09-28 LAB — CBC WITH DIFFERENTIAL/PLATELET
Abs Immature Granulocytes: 0.04 10*3/uL (ref 0.00–0.07)
Basophils Absolute: 0 10*3/uL (ref 0.0–0.1)
Basophils Relative: 0 %
Eosinophils Absolute: 0 10*3/uL (ref 0.0–0.5)
Eosinophils Relative: 0 %
HCT: 34 % — ABNORMAL LOW (ref 36.0–46.0)
Hemoglobin: 10.4 g/dL — ABNORMAL LOW (ref 12.0–15.0)
Immature Granulocytes: 1 %
Lymphocytes Relative: 3 %
Lymphs Abs: 0.2 10*3/uL — ABNORMAL LOW (ref 0.7–4.0)
MCH: 29 pg (ref 26.0–34.0)
MCHC: 30.6 g/dL (ref 30.0–36.0)
MCV: 94.7 fL (ref 80.0–100.0)
Monocytes Absolute: 0.2 10*3/uL (ref 0.1–1.0)
Monocytes Relative: 2 %
Neutro Abs: 7 10*3/uL (ref 1.7–7.7)
Neutrophils Relative %: 94 %
Platelets: 332 10*3/uL (ref 150–400)
RBC: 3.59 MIL/uL — ABNORMAL LOW (ref 3.87–5.11)
RDW: 14.4 % (ref 11.5–15.5)
WBC: 7.4 10*3/uL (ref 4.0–10.5)
nRBC: 0 % (ref 0.0–0.2)

## 2020-09-28 LAB — PROCALCITONIN: Procalcitonin: <0.1 ng/mL

## 2020-09-28 LAB — MAGNESIUM: Magnesium: 1.9 mg/dL (ref 1.7–2.4)

## 2020-09-28 LAB — BASIC METABOLIC PANEL
Anion gap: 11 (ref 5–15)
BUN: 54 mg/dL — ABNORMAL HIGH (ref 8–23)
CO2: 37 mmol/L — ABNORMAL HIGH (ref 22–32)
Calcium: 8.9 mg/dL (ref 8.9–10.3)
Chloride: 90 mmol/L — ABNORMAL LOW (ref 98–111)
Creatinine, Ser: 1.38 mg/dL — ABNORMAL HIGH (ref 0.44–1.00)
GFR, Estimated: 42 mL/min — ABNORMAL LOW (ref >60)
Glucose, Bld: 137 mg/dL — ABNORMAL HIGH (ref 70–99)
Potassium: 5.1 mmol/L (ref 3.5–5.1)
Sodium: 138 mmol/L (ref 135–145)

## 2020-09-28 LAB — GLUCOSE, CAPILLARY
Glucose-Capillary: 108 mg/dL — ABNORMAL HIGH (ref 70–99)
Glucose-Capillary: 187 mg/dL — ABNORMAL HIGH (ref 70–99)
Glucose-Capillary: 170 mg/dL — ABNORMAL HIGH (ref 70–99)
Glucose-Capillary: 208 mg/dL — ABNORMAL HIGH (ref 70–99)

## 2020-09-28 LAB — PHOSPHORUS: Phosphorus: 4.1 mg/dL (ref 2.5–4.6)

## 2020-09-28 MED ORDER — APIXABAN 5 MG PO TABS
5.0000 mg | ORAL_TABLET | Freq: Two times a day (BID) | ORAL | Status: DC
  Administered 2020-09-28 – 2020-10-03 (×11): 5 mg via ORAL
  Filled 2020-09-28 (×11): qty 1

## 2020-09-28 MED ORDER — METHYLPREDNISOLONE SODIUM SUCC 40 MG IJ SOLR
40.0000 mg | Freq: Two times a day (BID) | INTRAMUSCULAR | Status: DC
  Administered 2020-09-28 – 2020-09-29 (×2): 40 mg via INTRAVENOUS
  Filled 2020-09-28 (×2): qty 1

## 2020-09-28 NOTE — Progress Notes (Signed)
Pt continues to have congested cough, with thick secretions. Has good adequate output, a total of 1600 for the entire shift, and has not reported any signs of nausea/vomitting episodes. PO amiodarone 400mg  was given last night. HR remained in the 60s and pt continues to convert in and out of Afib. Still on HFNC for oxygen, slowly weaning down, now at 35% on 35L/min.

## 2020-09-28 NOTE — Progress Notes (Addendum)
Progress Note  Patient Name: Kristin [name] Date of Encounter: 09/28/2020  Primary Cardiologist: Ida Rogue, MD   Subjective   She denies chest pain and reports improved breathing.  She is not dizzy, despite pauses up to 5.0 seconds on telemetry at approximately 21: 35 on 5/14.  Inpatient Medications    Scheduled Meds: . amiodarone  400 mg Oral BID  . budesonide (PULMICORT) nebulizer solution  0.5 mg Nebulization BID  . chlorhexidine  15 mL Mouth Rinse BID  . Chlorhexidine Gluconate Cloth  6 each Topical Daily  . docusate sodium  100 mg Oral BID  . enoxaparin (LOVENOX) injection  125 mg Subcutaneous Q12H  . feeding supplement  237 mL Oral TID BM  . furosemide  40 mg Intravenous BID  . insulin aspart  0-20 Units Subcutaneous TID WC  . insulin aspart  0-5 Units Subcutaneous QHS  . ipratropium-albuterol  3 mL Nebulization Q4H  . levothyroxine  75 mcg Oral Q0600  . mouth rinse  15 mL Mouth Rinse q12n4p  . methylPREDNISolone (SOLU-MEDROL) injection  60 mg Intravenous Q12H  . multivitamin with minerals  1 tablet Oral Daily  . polyethylene glycol  17 g Oral Daily  . sodium chloride flush  3 mL Intravenous Q12H  . tiotropium  18 mcg Inhalation Daily   Continuous Infusions: . sodium chloride    . ampicillin-sulbactam (UNASYN) IV Stopped (09/28/20 0820)  . famotidine (PEPCID) IV Stopped (09/28/20 0529)   PRN Meds: sodium chloride, acetaminophen, albuterol, ALPRAZolam, ondansetron (ZOFRAN) IV, prochlorperazine, sodium chloride flush   Vital Signs    Vitals:   09/28/20 0751 09/28/20 0752 09/28/20 0800 09/28/20 0900  BP:   117/90 (!) 136/57  Pulse:   73 73  Resp:   20 18  Temp:  (!) 97.5 F (36.4 C)    TempSrc:  Oral    SpO2: 97%  95% 95%  Weight:      Height:        Intake/Output Summary (Last 24 hours) at 09/28/2020 0934 Last data filed at 09/28/2020 0900 Gross per 24 hour  Intake 916.57 ml  Output 4700 ml  Net -3783.43 ml   Last 3 Weights 09/28/2020 09/27/2020  09/26/2020  Weight (lbs) 249 lb 5.4 oz 255 lb 1.2 oz 262 lb 5.6 oz  Weight (kg) 113.1 kg 115.7 kg 119 kg      Telemetry    Currently NSR with previous Afib and SVT yesterday and into ~1-2AM this morning. SB-ST, PVCs, pauses up to 5.06 seconds -->5.06 second pause at 21: 35 and 5/14, 4.34-second pause at 22: 48 on 5/14 and 4.15 pause at 19:05- Personally Reviewed  ECG    No new tracings- Personally Reviewed  Physical Exam   GEN: No acute distress.  Lying in bed.  Less somnolent and Bipap in place.  Neck: Difficult to assess due to body habitus Cardiac: RRR with extrasystole, 2/6 systolic murmur. No rubs, or gallops.  Respiratory: Coarse breath sounds bilaterally on BiPAP GI: Soft, nontender, non-distended  MS: No pitting edema; No deformity. Neuro:  Nonfocal  Psych: Normal affect   Labs    High Sensitivity Troponin:   Recent Labs  Lab 09/25/20 2158 09/25/20 2357 09/27/20 0313 09/27/20 0502  TROPONINIHS 12 11 18* 19*      Chemistry Recent Labs  Lab 09/25/20 2158 09/26/20 0150 09/26/20 0425 09/27/20 0313 09/28/20 0416  NA 126*   < > 128* 131* 138  K 5.9*   < > 5.1 5.4* 5.1  CL 86*   < > 89* 88* 90*  CO2 31   < > 30 33* 37*  GLUCOSE 155*   < > 195* 181* 137*  BUN 58*   < > 56* 56* 54*  CREATININE 2.16*   < > 1.98* 1.69* 1.38*  CALCIUM 8.0*   < > 7.9* 8.8* 8.9  PROT 6.9  --   --  7.3  --   ALBUMIN 3.0*  --   --  3.1*  --   AST 32  --   --  27  --   ALT 24  --   --  24  --   ALKPHOS 42  --   --  44  --   BILITOT 0.5  --   --  0.6  --   GFRNONAA 24*   < > 27* 33* 42*  ANIONGAP 9   < > 9 10 11    < > = values in this interval not displayed.     Hematology Recent Labs  Lab 09/26/20 0425 09/27/20 0313 09/28/20 0416  WBC 10.5 13.4* 7.4  RBC 3.47* 3.91 3.59*  HGB 10.5* 11.4* 10.4*  HCT 31.9* 36.5 34.0*  MCV 91.9 93.4 94.7  MCH 30.3 29.2 29.0  MCHC 32.9 31.2 30.6  RDW 14.6 14.6 14.4  PLT 311 364 332    BNP Recent Labs  Lab 09/25/20 2157  09/27/20 0313  BNP 1,972.2* 1,737.3*     DDimer No results for input(s): DDIMER in the last 168 hours.   Radiology    DG Chest 1 View  Result Date: 09/27/2020 CLINICAL DATA:  Vomiting, aspiration EXAM: CHEST  1 VIEW COMPARISON:  Sep 25, 2020 FINDINGS: Similar cardiomegaly. Increased bilateral peribronchial and interstitial thickening. No visible pleural effusion or pneumothorax. Proximal right humerus fracture again visualized. Thoracic spondylosis. IMPRESSION: Cardiomegaly with increased bilateral peribronchial and interstitial thickening, which may be related to increasing pulmonary edema and/or reported aspiration. Electronically Signed   By: Dahlia Bailiff MD   On: 09/27/2020 03:03   DG Abd 1 View  Result Date: 09/27/2020 CLINICAL DATA:  Vomiting/aspiration EXAM: ABDOMEN - 1 VIEW COMPARISON:  CT April 08, 2020. FINDINGS: Gaseous distension of the stomach with bowel gas visualized to the level of the rectum. No pathologically dilated loops of small or large bowel visualized. IMPRESSION: Gaseous distension of the stomach. No pathologically dilated loops of small or large bowel visualized. Electronically Signed   By: Dahlia Bailiff MD   On: 09/27/2020 03:06   ECHOCARDIOGRAM COMPLETE  Result Date: 09/26/2020    ECHOCARDIOGRAM REPORT   Patient Name:   Kristin [name] Date of Exam: 09/26/2020 Medical Rec #:  [id number]     Height:       62.0 in Accession #:    [id number]    Weight:       271.2 lb Date of Birth:  1950/07/18    BSA:          2.176 m Patient Age:    70 years      BP:           145/62 mmHg Patient Gender: F             HR:           80 bpm. Exam Location:  [facility] Procedure: 2D Echo, Cardiac Doppler and Color Doppler Indications:     CHF -acute diastolic M09.47  History:         Patient has prior history of Echocardiogram examinations,  most                  recent 03/29/2020. CHF, COPD; Risk Factors:Diabetes and Sleep                  Apnea.  Sonographer:     Sherrie Sport RDCS (AE) Referring  Phys:  [id number] Arvil Chaco Diagnosing Phys: Nelva Bush MD  Sonographer Comments: Technically difficult study due to poor echo windows, no apical window and no subcostal window. Image acquisition challenging due to COPD. IMPRESSIONS  1. Left ventricular ejection fraction, by estimation, is >55%. The left ventricle has normal function. The left ventricle has no regional wall motion abnormalities. There is mild left ventricular hypertrophy. Left ventricular diastolic function could not be evaluated.  2. Right ventricular systolic function is normal. The right ventricular size is mildly enlarged.  3. The mitral valve was not well visualized. Unable to accurately assess mitral valve regurgitation. Moderate mitral annular calcification.  4. The aortic valve was not well visualized. Aortic valve regurgitation not well assessed. Aortic valve gradient unable to be assessed. FINDINGS  Left Ventricle: Left ventricular ejection fraction, by estimation, is >55%. The left ventricle has normal function. The left ventricle has no regional wall motion abnormalities. The left ventricular internal cavity size was normal in size. There is mild  left ventricular hypertrophy. Left ventricular diastolic function could not be evaluated. Right Ventricle: The right ventricular size is mildly enlarged. No increase in right ventricular wall thickness. Right ventricular systolic function is normal. Left Atrium: Left atrial size was not well visualized. Right Atrium: Right atrial size was not well visualized. Pericardium: The pericardium was not well visualized. Mitral Valve: The mitral valve was not well visualized. Moderate mitral annular calcification. Unable to accurately assess mitral valve regurgitation. Tricuspid Valve: The tricuspid valve is not well visualized. Tricuspid valve regurgitation is trivial. Aortic Valve: The aortic valve was not well visualized. Aortic valve regurgitation not well assessed. Aortic valve gradient  unable to be assessed. Pulmonic Valve: The pulmonic valve was not well visualized. Aorta: The aortic root is normal in size and structure. IAS/Shunts: The interatrial septum was not assessed.  LEFT VENTRICLE PLAX 2D LVIDd:         3.98 cm LVIDs:         2.73 cm LV PW:         1.36 cm LV IVS:        1.24 cm LVOT diam:     2.00 cm LVOT Area:     3.14 cm  LEFT ATRIUM         Index LA diam:    4.20 cm 1.93 cm/m   AORTA Ao Root diam: 2.50 cm  SHUNTS Systemic Diam: 2.00 cm Nelva Bush MD Electronically signed by Nelva Bush MD Signature Date/Time: 09/26/2020/6:11:15 PM    Final     Cardiac Studies   TTE 09/26/20  1. Left ventricular ejection fraction, by estimation, is >55%. The left  ventricle has normal function. The left ventricle has no regional wall  motion abnormalities. There is mild left ventricular hypertrophy. Left  ventricular diastolic function could  not be evaluated.  2. Right ventricular systolic function is normal. The right ventricular  size is mildly enlarged.  3. The mitral valve was not well visualized. Unable to accurately assess  mitral valve regurgitation. Moderate mitral annular calcification.  4. The aortic valve was not well visualized. Aortic valve regurgitation  not well assessed. Aortic valve gradient unable to be  assessed.   05/29/20 biopsy done CXR IMPRESSION: 1. No pneumothorax after RIGHT lung biopsy. Expected post biopsy changes surrounding the nodule in the superior segment RIGHT LOWER LOBE. 2. Stable changes of chronic bronchitis and/or asthma.  MR brain 05/2020 without mets to brain  05/13/2020 NM PET  IMPRESSION: 1. 2.1 by 1.4 cm right lower lobe pulmonary nodule is hypermetabolic with maximum SUV of 5.4, compatible with malignancy. No appreciable nodal or metastatic spread. 2. Mildly accentuated activity along the inferior endplate of L2, probably incidental. 3. Hypodense 1.5 by 1.2 cm right inferior thyroid lobe nodule. Recommend  thyroid US (ref: J Am Coll Radiol. 2015 Feb;12(2): 143-50). 4. Other imaging findings of potential clinical significance: Chronic right maxillary sinusitis. Aortic Atherosclerosis (ICD10-I70.0). Coronary atherosclerosis. Mild cardiomegaly. Mitral valve calcification. Diffuse hepatic steatosis. Cholelithiasis. Complex but photopenic left kidney upper pole cyst, most likely to be benign.  06/15/2019 CTA chest and aorta IMPRESSION: 1. Vascular findings and measurements pertinent to potential TAVR procedure, as detailed above. 2. Severe thickening calcification of the aortic valve, compatible with reported clinical history of severe aortic stenosis. 3. 1.9 x 1.4 x 1.6 cm macrolobulated nodule in the superior segment of the right lower lobe, highly concerning for primary bronchogenic neoplasm. Further evaluation with PET-CT is recommended in the near future to better evaluate this finding and assess for potential metastatic disease. No definite metastatic disease is identified in the chest, abdomen or pelvis on today's examination. 4. Aortic atherosclerosis, in addition to left main and 3 vessel coronary artery disease. 5. Severe calcifications of the mitral annulus. 6. Severe hepatic steatosis. 7. Colonic diverticulosis without evidence of acute diverticulitis at this time. 8. Additional incidental findings, as above. These results Jayten Gabbard be called to the ordering clinician or representative by the Radiologist Assistant, and communication documented in the PACS or [employer].  CTA abdomen and pelvis 04/14/2020 IMPRESSION: 1. Vascular findings and measurements pertinent to potential TAVR procedure, as detailed above. 2. Severe thickening calcification of the aortic valve, compatible with reported clinical history of severe aortic stenosis. 3. 1.9 x 1.4 x 1.6 cm macrolobulated nodule in the superior segment of the right lower lobe, highly concerning for primary  bronchogenic neoplasm. Further evaluation with PET-CT is recommended in the near future to better evaluate this finding and assess for potential metastatic disease. No definite metastatic disease is identified in the chest, abdomen or pelvis on today's examination. 4. Aortic atherosclerosis, in addition to left main and 3 vessel coronary artery disease. 5. Severe calcifications of the mitral annulus. 6. Severe hepatic steatosis. 7. Colonic diverticulosis without evidence of acute diverticulitis at this time. 8. Additional incidental findings, as above. These results Juletta Berhe be called to the ordering clinician or representative by the Radiologist Assistant, and communication documented in the PACS or [employer].  Cardiac CTA 04/14/2020 IMPRESSION: 1. Trileaflet aortic valve with moderately calcified leaflets and moderately restricted leaflet opening. Aortic valve calcium score 1257. (Severe if > 2000 in males). Annular measurements suitable for delivery of a 26 mm Edwards-SAPIEN 3 Ultra valve. 2. Sufficient coronary to annulus distance. 3. Optimum Fluoroscopic Angle for Delivery: RAO 2 CRA 3. 4. No thrombus in the left atrial appendage.   US carotid 04/01/2020 IMPRESSION: Color duplex indicates minimal heterogeneous and calcified plaque, with no hemodynamically significant stenosis by duplex criteria in the extracranial cerebrovascular circulation.  R/LHC 03/31/2020  Prox RCA lesion is 30% stenosed.  Mid RCA lesion is 40% stenosed.  RPDA lesion is 30% stenosed.  Prox Cx lesion is 60%  stenosed.  Mid LAD lesion is 20% stenosed.  Mid Cx to Dist Cx lesion is 20% stenosed. 1. Mild to moderate nonobstructive coronary artery disease. Worst stenosis is 60% in the proximal left circumflex. No evidence of obstructive disease. Coronary arteries are overall moderately calcified. 2. Right heart catheterization showed moderately elevated left-sided filling pressures,  moderate pulmonary hypertension and moderately reduced cardiac output. 3. Severe aortic stenosis with mean gradient of 31 mmHg and calculated valve area of 0.7 cm. Recommendations: The patient is significantly volume overloaded. I switched furosemide to intravenous 20 mg twice daily. I increased carvedilol for better blood pressure control. Recommend medical therapy for nonobstructive coronary artery disease. Recommend outpatient TAVR evaluation.  Echo 03/29/2020 1. Left ventricular ejection fraction, by estimation, is 55 to 60%. The  left ventricle has normal function. The left ventricle has no regional  wall motion abnormalities. There is mild left ventricular hypertrophy.  Left ventricular diastolic parameters  are consistent with Grade I diastolic dysfunction (impaired relaxation).  2. Right ventricular systolic function is normal. The right ventricular  size is normal. Tricuspid regurgitation signal is inadequate for assessing  PA pressure.  3. Left atrial size was mildly dilated.  4. The aortic valve was not well visualized. Moderate to severe aortic  valve stenosis. Aortic valve area, by VTI measures 1.11 cm. Aortic valve  mean gradient measures 31.0 mmHg. Aortic valve Vmax measures 3.66 m/s.   Patient Profile     70 y.o. 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  Assessment & Plan    Hypoxic and hypercapnic respiratory failure  Acute on Chronic HFpEF -- Reports improving shortness of breath with diuresis and oxygen and BiPAP.  As previously noted, breathing likely  multifactorial in the setting of COPD exacerbation and volume overload.  Renal function, tachycardic rate, and patient's shortness of breath has been improving with IV diuresis.  Of note, previous RHC showed moderately elevated LVEDP, PHTN, moderately reduced CO, AS. Most recent echo as above with EF above 55%.   Continue IV diuresis with IV lasix 40mg  BID.    Continue to monitor I/os, daily standing weights.  Daily BMET.  Cr 1.69  1.38 and BUN 56  54. Improving with diuresis.  Net -6.9L for admission and -7.2L yesterday.   No BB given post-termination pauses as above.  Holding ARB given renal function.  Atrial fibrillation/atrial flutter --Currently NSR.  As previously noted, it is suspected that her ventricular rate in A. fib earlier was exacerbated by her respiratory distress and acute on chronic diastolic heart failure with volume overload.  With diuresis and restart of amiodarone, her rates have improved this admission.  Continue increased dose amiodarone 400 mg twice daily with rates currently well controlled as of this morning.  Chai Routh speak with rounding physician regarding earlier pauses with current recommendations as below  Anticoagulation on hold.  Ideally, given her CHA2DS2-VASc score and episodes of A. fib, anticoagulation should be started once felt safe to do so by CCM/oncology. CHA2DS2VASc score of at least  4 (age, DM2, vascular, female).  Junctional rhythm -- As above, avoid AV nodal blocking agents.  Most recent telemetry reviewed with up to  5.06-second pauses around 21: 35 on 5/14.  Ellieanna Funderburg discuss with MD.  Moderate nonobstructive coronary artery disease by 03/31/2020 catheterization History of NSTEMI --No CP.  Continue current medications. No BB in the setting of post termination pauses.  Holding ARB due to Cr.Marland Kitchen Restart PTA atorvastatin 80mg  daily with LDL goal below 70. LFTs wnl on last check. Recommend smoking cessation.  Moderate aortic stenosis --S/p R/LHC with AS  mean gradient of 31 mmHg and calculated valve area of 0.7 cm. Cardiac CTA performed, as well as carotids, and as copied and pasted above. OP workup has been complicated by SCC of RLL (see bx copied above).  She has been seen as OP by both the TAVR team and CTS with most recent visits reviewed in EMR. Recommend ongoing OP workup by both CTS and TAVR team as directed and with consideration of current oncology workup.  Of note, most recent echo as above with EF greater than 55% but unable to assess aortic valve gradient.  HTN ---BP borderline. Continue IV diuresis. If remains elevated, consider titration or additional antihypertensives.  As previously noted, avoid AV nodal blocking agents due to pauses as above.  SCC of RLL  -- Per CCM/oncology.  HLD --Continue current atorvastatin 80mg  daily.   DM2 -- SSI, per IM/CCM.  Hypothyroidism --Per CCM/IM/PCP.    For questions or updates, please contact Daphne Please consult [email or web address] for contact info under        Signed, Arvil Chaco, PA-C  09/28/2020, 9:34 AM      I have seen and examined this patient with Marrianne Mood.  Agree with above, note added to reflect my findings.  On exam, RRR, no murmurs, crackles bilaterally.  Patient remains volume overloaded.  With diuresis, her kidney function has improved.  We Charlane Westry continue with IV diuresis throughout her hospital stay.  She likely has obstructive sleep apnea and Pardeep Pautz need outpatient work-up for this.  She did have postconversion pauses from atrial fibrillation to sinus rhythm.  She was asymptomatic.  We Swayze Pries continue with current management.  Loudon Krakow M. Caoilainn Sacks MD 09/28/2020 11:47 AM

## 2020-09-28 NOTE — Progress Notes (Addendum)
PROGRESS NOTE    Mikisha A Murchison  QAS:[id number] DOB: [birth year] DOA: 09/25/2020 PCP: [place]  Brief Narrative: Ms. Buske is a chronically ill morbidly obese female with history of nonobstructive CAD, severe aortic stenosis being evaluated for TAVR, chronic diastolic CHF, advanced COPD/chronic respiratory failure on 3 L home O2, history of atrial flutter on amiodarone and apixaban, OSA not on CPAP, hypothyroidism, morbid obesity, right lower lobe lung cancer status post XRT, diabetes, right humeral fracture presented to the ED on 5/12 with respiratory distress, she was noted to be tachypneic and treated with BiPAP briefly she was admitted to the ICU, chest x-ray was concerning for pulmonary vascular congestion, COVID and flu PCR were negative, she was treated with IV Lasix duo nebs and steroids also  received a dose of Narcan. -Transferred to [facility] service today 5/14   Assessment & Plan:   Acute on chronic hypoxic and hypercarbic respiratory failure Acute on chronic diastolic CHF, severe AS COPD exacerbation OSA/OHS Aspiration pneumonia -Multifactorial respiratory failure likely worsened by recent opiate use -Treated with BiPAP on admission and then weaned down to 4 L, overnight-5/14, vomited and had worsening hypoxia concerning for aspiration, started on Unasyn and high flow nasal cannula, continue Unasyn today, -Diuresing well, she is 7.1 L negative, continue IV Lasix -Will cut down IV steroids, no evidence of wheezing at this time -Palliative care following as well, now DNR, high risk of decompensation and quick readmission  Acute diastolic CHF Severe aortic stenosis -Continue IV Lasix today -Being evaluated as outpatient for TAVR work-up which has been deferred  Paroxysmal atrial flutter/A. Fib with RVR and bradycardia -Was bradycardic on admission requiring atropine -Coreg and amiodarone were held initially -Amiodarone resumed -Discontinue Lovenox, will resume  Eliquis  Acute metabolic encephalopathy -Multifactorial, chronically hypercarbic from advanced COPD and untreated OSA/OHS -off BiPAP due to vomiting/aspiration event overnight 5/14 -Improved -DNR, discontinued fentanyl patch  AKI on CKD 3 -Hyperkalemia -Likely cardiorenal, improving with diuresis  Closed fracture of proximal end of right humerus 4/29 -keeps arm immobilized but refuses sling -Tylenol as needed  Type 2 diabetes mellitus  Non-small cell lung cancer Heavy tobacco abuse -Recently diagnosed, treated with XRT in 3/22  Obesity -BMI -45.6  Hypothyroidism -Continue Synthroid  DVT prophylaxis: Full dose Lovenox Code Status: DNR Family Communication: No family at bedside, called and updated daughter [name] Disposition Plan:  Status is: Inpatient  Remains inpatient appropriate because:Inpatient level of care appropriate due to severity of illness   Dispo: The patient is from: Home              Anticipated d/c is to: SNF              Patient currently is not medically stable to d/c.   Difficult to place patient No   Consultants:   PCCM   Procedures:   Antimicrobials:    Subjective: -Breathing improving she is peeing well, FiO2 down to 35%, eating a lot per staff  Objective: Vitals:   09/28/20 1000 09/28/20 1049 09/28/20 1052 09/28/20 1100  BP: (!) 125/54 (!) 125/54  (!) 129/48  Pulse: 76 76  77  Resp: 18   20  Temp:      TempSrc:      SpO2: 97% 93% 98% 97%  Weight:      Height:        Intake/Output Summary (Last 24 hours) at 09/28/2020 1126 Last data filed at 09/28/2020 1100 Gross per 24 hour  Intake 929.82 ml  Output  3700 ml  Net -2770.18 ml   Filed Weights   09/26/20 1200 09/27/20 0358 09/28/20 0500  Weight: 119 kg 115.7 kg 113.1 kg    Examination:  General exam: Chronically ill morbidly obese female, sitting up in bed, more lucid today, oriented x3, no distress CVS: S1-S2, irregularly irregular rhythm, systolic ejection  murmur Lungs: Poor air movement bilaterally, few basilar rales Abdomen: Obese, soft, nontender, bowel sounds present Extremities: No edema  Skin: No rashes on exposed skin Psych: Flat affect  Data Reviewed:   CBC: Recent Labs  Lab 09/25/20 2158 09/26/20 0425 09/27/20 0313 09/28/20 0416  WBC 13.8* 10.5 13.4* 7.4  NEUTROABS 12.2*  --  12.8* 7.0  HGB 10.8* 10.5* 11.4* 10.4*  HCT 34.0* 31.9* 36.5 34.0*  MCV 92.4 91.9 93.4 94.7  PLT 352 311 364 336   Basic Metabolic Panel: Recent Labs  Lab 09/25/20 2158 09/26/20 0150 09/26/20 0425 09/27/20 0313 09/28/20 0416  NA 126* 126* 128* 131* 138  K 5.9* 5.8* 5.1 5.4* 5.1  CL 86* 87* 89* 88* 90*  CO2 31 29 30  33* 37*  GLUCOSE 155* 169* 195* 181* 137*  BUN 58* 57* 56* 56* 54*  CREATININE 2.16* 2.05* 1.98* 1.69* 1.38*  CALCIUM 8.0* 7.9* 7.9* 8.8* 8.9  MG  --  1.9  --  2.1 1.9  PHOS  --   --   --  5.2* 4.1   GFR: Estimated Creatinine Clearance: 46.4 mL/min (A) (by C-G formula based on SCr of 1.38 mg/dL (H)). Liver Function Tests: Recent Labs  Lab 09/25/20 2158 09/27/20 0313  AST 32 27  ALT 24 24  ALKPHOS 42 44  BILITOT 0.5 0.6  PROT 6.9 7.3  ALBUMIN 3.0* 3.1*   No results for input(s): LIPASE, AMYLASE in the last 168 hours. No results for input(s): AMMONIA in the last 168 hours. Coagulation Profile: No results for input(s): INR, PROTIME in the last 168 hours. Cardiac Enzymes: No results for input(s): CKTOTAL, CKMB, CKMBINDEX, TROPONINI in the last 168 hours. BNP (last 3 results) No results for input(s): PROBNP in the last 8760 hours. HbA1C: Recent Labs    09/26/20 0126  HGBA1C 6.2*   CBG: Recent Labs  Lab 09/27/20 0700 09/27/20 1100 09/27/20 1543 09/27/20 2138 09/28/20 0740  GLUCAP 169* 151* 260* 138* 108*   Lipid Profile: No results for input(s): CHOL, HDL, LDLCALC, TRIG, CHOLHDL, LDLDIRECT in the last 72 hours. Thyroid Function Tests: No results for input(s): TSH, T4TOTAL, FREET4, T3FREE, THYROIDAB in  the last 72 hours. Anemia Panel: No results for input(s): VITAMINB12, FOLATE, FERRITIN, TIBC, IRON, RETICCTPCT in the last 72 hours. Urine analysis:    Component Value Date/Time   COLORURINE YELLOW (A) 05/26/2020 1001   APPEARANCEUR CLEAR (A) 05/26/2020 1001   LABSPEC 1.017 05/26/2020 1001   PHURINE 5.0 05/26/2020 1001   GLUCOSEU NEGATIVE 05/26/2020 1001   HGBUR NEGATIVE 05/26/2020 1001   BILIRUBINUR NEGATIVE 05/26/2020 1001   [place] 05/26/2020 1001   PROTEINUR 30 (A) 05/26/2020 1001   NITRITE NEGATIVE 05/26/2020 1001   LEUKOCYTESUR NEGATIVE 05/26/2020 1001   Sepsis Labs: @LABRCNTIP (procalcitonin:4,lacticidven:4)  ) Recent Results (from the past 240 hour(s))  Resp Panel by RT-PCR (Flu A&B, Covid) Nasopharyngeal Swab     Status: None   Collection Time: 09/25/20  9:57 PM   Specimen: Nasopharyngeal Swab; Nasopharyngeal(NP) swabs in vial transport medium  Result Value Ref Range Status   SARS Coronavirus 2 by RT PCR NEGATIVE NEGATIVE Final    Comment: (NOTE) SARS-CoV-2 target nucleic  acids are NOT DETECTED.  The SARS-CoV-2 RNA is generally detectable in upper respiratory specimens during the acute phase of infection. The lowest concentration of SARS-CoV-2 viral copies this assay can detect is 138 copies/mL. A negative result does not preclude SARS-Cov-2 infection and should not be used as the sole basis for treatment or other patient management decisions. A negative result may occur with  improper specimen collection/handling, submission of specimen other than nasopharyngeal swab, presence of viral mutation(s) within the areas targeted by this assay, and inadequate number of viral copies(<138 copies/mL). A negative result must be combined with clinical observations, patient history, and epidemiological information. The expected result is Negative.  Fact Sheet for Patients:  EntrepreneurPulse.com.au  Fact Sheet for Healthcare Providers:   IncredibleEmployment.be  This test is no t yet approved or cleared by the [place] FDA and  has been authorized for detection and/or diagnosis of SARS-CoV-2 by FDA under an Emergency Use Authorization (EUA). This EUA will remain  in effect (meaning this test can be used) for the duration of the COVID-19 declaration under Section 564(b)(1) of the Act, 21 U.S.C.section 360bbb-3(b)(1), unless the authorization is terminated  or revoked sooner.       Influenza A by PCR NEGATIVE NEGATIVE Final   Influenza B by PCR NEGATIVE NEGATIVE Final    Comment: (NOTE) The Xpert Xpress SARS-CoV-2/FLU/RSV plus assay is intended as an aid in the diagnosis of influenza from Nasopharyngeal swab specimens and should not be used as a sole basis for treatment. Nasal washings and aspirates are unacceptable for Xpert Xpress SARS-CoV-2/FLU/RSV testing.  Fact Sheet for Patients: EntrepreneurPulse.com.au  Fact Sheet for Healthcare Providers: IncredibleEmployment.be  This test is not yet approved or cleared by the [place] FDA and has been authorized for detection and/or diagnosis of SARS-CoV-2 by FDA under an Emergency Use Authorization (EUA). This EUA will remain in effect (meaning this test can be used) for the duration of the COVID-19 declaration under Section 564(b)(1) of the Act, 21 U.S.C. section 360bbb-3(b)(1), unless the authorization is terminated or revoked.  Performed at [facility], [address] [place], [place] [address]   MRSA PCR Screening     Status: None   Collection Time: 09/26/20  3:40 AM   Specimen: Nasopharyngeal  Result Value Ref Range Status   MRSA by PCR NEGATIVE NEGATIVE Final    Comment:        The GeneXpert MRSA Assay (FDA approved for NASAL specimens only), is one component of a comprehensive MRSA colonization surveillance program. It is not intended to diagnose MRSA infection nor to guide  or monitor treatment for MRSA infections. Performed at [facility], [address] [place], [place] [address]          Radiology Studies: DG Chest 1 View  Result Date: 09/27/2020 CLINICAL DATA:  Vomiting, aspiration EXAM: CHEST  1 VIEW COMPARISON:  Sep 25, 2020 FINDINGS: Similar cardiomegaly. Increased bilateral peribronchial and interstitial thickening. No visible pleural effusion or pneumothorax. Proximal right humerus fracture again visualized. Thoracic spondylosis. IMPRESSION: Cardiomegaly with increased bilateral peribronchial and interstitial thickening, which may be related to increasing pulmonary edema and/or reported aspiration. Electronically Signed   By: Dahlia Bailiff MD   On: 09/27/2020 03:03   DG Abd 1 View  Result Date: 09/27/2020 CLINICAL DATA:  Vomiting/aspiration EXAM: ABDOMEN - 1 VIEW COMPARISON:  CT April 08, 2020. FINDINGS: Gaseous distension of the stomach with bowel gas visualized to the level of the rectum. No pathologically dilated loops of small  or large bowel visualized. IMPRESSION: Gaseous distension of the stomach. No pathologically dilated loops of small or large bowel visualized. Electronically Signed   By: Dahlia Bailiff MD   On: 09/27/2020 03:06   ECHOCARDIOGRAM COMPLETE  Result Date: 09/26/2020    ECHOCARDIOGRAM REPORT   Patient Name:   KIMBERLEY A Thibeaux Date of Exam: 09/26/2020 Medical Rec #:  [id number]     Height:       62.0 in Accession #:    [id number]    Weight:       271.2 lb Date of Birth:  01-16-1951    BSA:          2.176 m Patient Age:    70 years      BP:           145/62 mmHg Patient Gender: F             HR:           80 bpm. Exam Location:  [facility] Procedure: 2D Echo, Cardiac Doppler and Color Doppler Indications:     CHF -acute diastolic Q25.95  History:         Patient has prior history of Echocardiogram examinations, most                  recent 03/29/2020. CHF, COPD; Risk Factors:Diabetes and Sleep                  Apnea.  Sonographer:      Sherrie Sport RDCS (AE) Referring Phys:  [id number] Arvil Chaco Diagnosing Phys: Nelva Bush MD  Sonographer Comments: Technically difficult study due to poor echo windows, no apical window and no subcostal window. Image acquisition challenging due to COPD. IMPRESSIONS  1. Left ventricular ejection fraction, by estimation, is >55%. The left ventricle has normal function. The left ventricle has no regional wall motion abnormalities. There is mild left ventricular hypertrophy. Left ventricular diastolic function could not be evaluated.  2. Right ventricular systolic function is normal. The right ventricular size is mildly enlarged.  3. The mitral valve was not well visualized. Unable to accurately assess mitral valve regurgitation. Moderate mitral annular calcification.  4. The aortic valve was not well visualized. Aortic valve regurgitation not well assessed. Aortic valve gradient unable to be assessed. FINDINGS  Left Ventricle: Left ventricular ejection fraction, by estimation, is >55%. The left ventricle has normal function. The left ventricle has no regional wall motion abnormalities. The left ventricular internal cavity size was normal in size. There is mild  left ventricular hypertrophy. Left ventricular diastolic function could not be evaluated. Right Ventricle: The right ventricular size is mildly enlarged. No increase in right ventricular wall thickness. Right ventricular systolic function is normal. Left Atrium: Left atrial size was not well visualized. Right Atrium: Right atrial size was not well visualized. Pericardium: The pericardium was not well visualized. Mitral Valve: The mitral valve was not well visualized. Moderate mitral annular calcification. Unable to accurately assess mitral valve regurgitation. Tricuspid Valve: The tricuspid valve is not well visualized. Tricuspid valve regurgitation is trivial. Aortic Valve: The aortic valve was not well visualized. Aortic valve regurgitation not  well assessed. Aortic valve gradient unable to be assessed. Pulmonic Valve: The pulmonic valve was not well visualized. Aorta: The aortic root is normal in size and structure. IAS/Shunts: The interatrial septum was not assessed.  LEFT VENTRICLE PLAX 2D LVIDd:         3.98 cm LVIDs:  2.73 cm LV PW:         1.36 cm LV IVS:        1.24 cm LVOT diam:     2.00 cm LVOT Area:     3.14 cm  LEFT ATRIUM         Index LA diam:    4.20 cm 1.93 cm/m   AORTA Ao Root diam: 2.50 cm  SHUNTS Systemic Diam: 2.00 cm Nelva Bush MD Electronically signed by Nelva Bush MD Signature Date/Time: 09/26/2020/6:11:15 PM    Final         Scheduled Meds: . amiodarone  400 mg Oral BID  . budesonide (PULMICORT) nebulizer solution  0.5 mg Nebulization BID  . chlorhexidine  15 mL Mouth Rinse BID  . Chlorhexidine Gluconate Cloth  6 each Topical Daily  . docusate sodium  100 mg Oral BID  . enoxaparin (LOVENOX) injection  125 mg Subcutaneous Q12H  . feeding supplement  237 mL Oral TID BM  . furosemide  40 mg Intravenous BID  . insulin aspart  0-20 Units Subcutaneous TID WC  . insulin aspart  0-5 Units Subcutaneous QHS  . ipratropium-albuterol  3 mL Nebulization Q4H  . levothyroxine  75 mcg Oral Q0600  . mouth rinse  15 mL Mouth Rinse q12n4p  . methylPREDNISolone (SOLU-MEDROL) injection  60 mg Intravenous Q12H  . multivitamin with minerals  1 tablet Oral Daily  . polyethylene glycol  17 g Oral Daily  . sodium chloride flush  3 mL Intravenous Q12H  . tiotropium  18 mcg Inhalation Daily   Continuous Infusions: . sodium chloride 5 mL/hr at 09/28/20 0821  . ampicillin-sulbactam (UNASYN) IV Stopped (09/28/20 0820)  . famotidine (PEPCID) IV Stopped (09/28/20 0529)     LOS: 3 days    Time spent: 79min  Domenic Polite, MD [facility]  09/28/2020, 11:26 AM

## 2020-09-28 NOTE — Evaluation (Signed)
Physical Therapy Evaluation Patient Details Name: Kristin [name] MRN: [id number] DOB: February 14, 1951 Today's Date: 09/28/2020   History of Present Illness  Pt is a 70 y/o F with PMH: nonobstructive CAD, severe aortic stenosis being evaluated for TAVR, chronic diastolic CHF, advanced COPD/chronic respiratory failure on 3 L home O2, history of atrial flutter on amiodarone and apixaban, OSA not on CPAP, hypothyroidism, morbid obesity, right lower lobe lung cancer status post XRT, diabetes, and recent right humeral fracture (to be immob w/ sling). Pt presented to the ED on 5/12 with respiratory distress, she was noted to be tachypneic and treated with BiPAP. Pt adm to ICU for monitoring, currently on [place].  Clinical Impression  The pt presents this session in great spirits. She is hesitant regarding mobility d/t fear of pain in the RUE, however when given autonomy over the movement and assistance granted with that UE she is willing to participate. The pt tolerance transfer to bedside chair +1 assistance with some mild balance deficits noted. She will require DME education d/t NWB nature of the RUE. At this time the pt is demonstrating need for STR at SNF, however, it is expected that with continued mobility she could progress to lesser care needs. PT will continue to follow to optimize functional mobility.     Follow Up Recommendations SNF    Equipment Recommendations  Cane    Recommendations for Other Services       Precautions / Restrictions Precautions Precautions: Fall Required Braces or Orthoses: Sling Restrictions Weight Bearing Restrictions: Yes RUE Weight Bearing: Non weight bearing      Mobility  Bed Mobility Overal bed mobility: Modified Independent Bed Mobility: Supine to Sit     Supine to sit: HOB elevated     General bed mobility comments: use of bed railing, able to acheive EOB without physical assistance.    Transfers Overall transfer level: Needs  assistance Equipment used: 1 person hand held assist Transfers: Sit to/from [facility] Sit to Stand: Min assist Stand pivot transfers: Min assist       General transfer comment: Pt with some unsteadiness that required correction for safety during stand step transfer to bedside chair. Only able to assist with 1 UE, benefitted from having RN in room for line management.  Ambulation/Gait             General Gait Details: Pt is ready for gait training during next sessions  Stairs            Wheelchair Mobility    Modified Rankin (Stroke Patients Only)       Balance Overall balance assessment: Needs assistance Sitting-balance support: No upper extremity supported;Feet unsupported Sitting balance-Leahy Scale: Fair     Standing balance support: Single extremity supported Standing balance-Leahy Scale: Fair                               Pertinent Vitals/Pain Faces Pain Scale: Hurts a little bit Pain Location: R UE; only c/o UE pain with movement. Pain Descriptors / Indicators: Sore Pain Intervention(s): Monitored during session;Repositioned    Home Living Family/patient expects to be discharged to:: Private residence Living Arrangements: Children;Other (Comment) Available Help at Discharge: Family;Available PRN/intermittently   Home Access: Stairs to enter Entrance Stairs-Rails: None Entrance Stairs-Number of Steps: 3 Home Layout: One level Home Equipment: Walker - 4 wheels      Prior Function Level of Independence: Needs assistance   Gait / Transfers  Assistance Needed: Pt was MOD I with 4WW up until R UE injury at end of april, in recent weeks, was requring close SUPV/SBA from dtr/granddtr  ADL's / Homemaking Assistance Needed: pt endorses basline difficulty with LB dressing, states she wore slip on shoes. States since R UE injury, she has been requiring assist for bathing/dressing from her daughter/grandtr. Family assist for  IADLs at baseline including cooking/cleaning.  Comments: on 3Lnxc home O2 at all times     Hand Dominance        Extremity/Trunk Assessment   Upper Extremity Assessment Upper Extremity Assessment: RUE deficits/detail RUE Deficits / Details: limited shoulder ROM 2/2 guarding, full AROM of the elbow, fingers, wrist.    Lower Extremity Assessment Lower Extremity Assessment: Generalized weakness       Communication      Cognition Arousal/Alertness: Awake/alert Behavior During Therapy: WFL for tasks assessed/performed Overall Cognitive Status: Within Functional Limits for tasks assessed                                        General Comments      Exercises Other Exercises Other Exercises: EDU: pt educated on PT POC and progression needed in order to safely progress towards PLOF.   Assessment/Plan    PT Assessment Patient needs continued PT services  PT Problem List Decreased strength;Decreased mobility;Decreased balance;Decreased activity tolerance;Cardiopulmonary status limiting activity       PT Treatment Interventions Gait training;Stair training;Functional mobility training;Balance training;Therapeutic exercise;Therapeutic activities;Patient/family education    PT Goals (Current goals can be found in the Care Plan section)  Acute Rehab PT Goals Patient Stated Goal: to get out of ICU PT Goal Formulation: With patient Time For Goal Achievement: 10/12/20 Potential to Achieve Goals: Good    Frequency Min 2X/week   Barriers to discharge        Co-evaluation               AM-PAC PT "6 Clicks" Mobility  Outcome Measure Help needed turning from your back to your side while in a flat bed without using bedrails?: A Lot Help needed moving from lying on your back to sitting on the side of a flat bed without using bedrails?: A Lot Help needed moving to and from a bed to a chair (including a wheelchair)?: A Little Help needed standing up from a  chair using your arms (e.g., wheelchair or bedside chair)?: A Little Help needed to walk in hospital room?: A Little Help needed climbing 3-5 steps with a railing? : A Lot 6 Click Score: 15    End of Session Equipment Utilized During Treatment: Oxygen Activity Tolerance: Patient tolerated treatment well;No increased pain Patient left: in chair;with call bell/phone within reach Nurse Communication: Mobility status PT Visit Diagnosis: Unsteadiness on feet (R26.81);Muscle weakness (generalized) (M62.81);Difficulty in walking, not elsewhere classified (R26.2)    Time: 1010-1045 PT Time Calculation (min) (ACUTE ONLY): 35 min   Charges:   PT Evaluation $PT Eval Moderate Complexity: 1 Mod PT Treatments $Therapeutic Activity: 23-37 mins        11:05 AM, 09/28/20 Kyung Muto A. Saverio Danker PT, DPT Physical Therapist - [facility]   Nashanti Duquette A Raj Landress 09/28/2020, 11:03 AM

## 2020-09-28 NOTE — Progress Notes (Signed)
PCCM following along peripherally Patient with end stage lung and heart diseases Supportive medical care being provided Follow up Palliative care-recommend hops ice Wean fio2 as tolerated, biPAP qhs  VS reviewed  BP (!) 153/55   Pulse 78   Temp 98.9 F (37.2 C) (Oral)   Resp 15   Ht 5\' 2"  (1.575 m)   Wt 113.1 kg   LMP  (LMP Unknown)   SpO2 97%   BMI 45.60 kg/m    No major changes needed at this time

## 2020-09-29 ENCOUNTER — Telehealth: Payer: Self-pay | Admitting: Cardiovascular Disease

## 2020-09-29 ENCOUNTER — Telehealth: Payer: Self-pay

## 2020-09-29 DIAGNOSIS — J441 Chronic obstructive pulmonary disease with (acute) exacerbation: Secondary | ICD-10-CM | POA: Diagnosis not present

## 2020-09-29 DIAGNOSIS — J9621 Acute and chronic respiratory failure with hypoxia: Secondary | ICD-10-CM | POA: Diagnosis not present

## 2020-09-29 DIAGNOSIS — I5033 Acute on chronic diastolic (congestive) heart failure: Secondary | ICD-10-CM | POA: Diagnosis not present

## 2020-09-29 DIAGNOSIS — Z515 Encounter for palliative care: Secondary | ICD-10-CM | POA: Diagnosis not present

## 2020-09-29 DIAGNOSIS — Z7189 Other specified counseling: Secondary | ICD-10-CM | POA: Diagnosis not present

## 2020-09-29 DIAGNOSIS — J9622 Acute and chronic respiratory failure with hypercapnia: Secondary | ICD-10-CM | POA: Diagnosis not present

## 2020-09-29 LAB — CBC WITH DIFFERENTIAL/PLATELET
Abs Immature Granulocytes: 0.09 10*3/uL — ABNORMAL HIGH (ref 0.00–0.07)
Basophils Absolute: 0 10*3/uL (ref 0.0–0.1)
Basophils Relative: 0 %
Eosinophils Absolute: 0 10*3/uL (ref 0.0–0.5)
Eosinophils Relative: 0 %
HCT: 35.5 % — ABNORMAL LOW (ref 36.0–46.0)
Hemoglobin: 11.1 g/dL — ABNORMAL LOW (ref 12.0–15.0)
Immature Granulocytes: 1 %
Lymphocytes Relative: 3 %
Lymphs Abs: 0.3 10*3/uL — ABNORMAL LOW (ref 0.7–4.0)
MCH: 29.1 pg (ref 26.0–34.0)
MCHC: 31.3 g/dL (ref 30.0–36.0)
MCV: 93.2 fL (ref 80.0–100.0)
Monocytes Absolute: 0.4 10*3/uL (ref 0.1–1.0)
Monocytes Relative: 4 %
Neutro Abs: 8.6 10*3/uL — ABNORMAL HIGH (ref 1.7–7.7)
Neutrophils Relative %: 92 %
Platelets: 375 10*3/uL (ref 150–400)
RBC: 3.81 MIL/uL — ABNORMAL LOW (ref 3.87–5.11)
RDW: 14.5 % (ref 11.5–15.5)
WBC: 9.3 10*3/uL (ref 4.0–10.5)
nRBC: 0 % (ref 0.0–0.2)

## 2020-09-29 LAB — BASIC METABOLIC PANEL
Anion gap: 8 (ref 5–15)
BUN: 54 mg/dL — ABNORMAL HIGH (ref 8–23)
CO2: 42 mmol/L — ABNORMAL HIGH (ref 22–32)
Calcium: 9.3 mg/dL (ref 8.9–10.3)
Chloride: 89 mmol/L — ABNORMAL LOW (ref 98–111)
Creatinine, Ser: 1.14 mg/dL — ABNORMAL HIGH (ref 0.44–1.00)
GFR, Estimated: 52 mL/min — ABNORMAL LOW (ref >60)
Glucose, Bld: 148 mg/dL — ABNORMAL HIGH (ref 70–99)
Potassium: 5.5 mmol/L — ABNORMAL HIGH (ref 3.5–5.1)
Sodium: 139 mmol/L (ref 135–145)

## 2020-09-29 LAB — GLUCOSE, CAPILLARY
Glucose-Capillary: 112 mg/dL — ABNORMAL HIGH (ref 70–99)
Glucose-Capillary: 146 mg/dL — ABNORMAL HIGH (ref 70–99)
Glucose-Capillary: 184 mg/dL — ABNORMAL HIGH (ref 70–99)
Glucose-Capillary: 192 mg/dL — ABNORMAL HIGH (ref 70–99)
Glucose-Capillary: 142 mg/dL — ABNORMAL HIGH (ref 70–99)

## 2020-09-29 MED ORDER — PREDNISONE 10 MG PO TABS
50.0000 mg | ORAL_TABLET | Freq: Every day | ORAL | Status: DC
  Administered 2020-09-30 – 2020-10-01 (×2): 50 mg via ORAL
  Filled 2020-09-29 (×2): qty 5

## 2020-09-29 MED ORDER — SODIUM ZIRCONIUM CYCLOSILICATE 5 G PO PACK
5.0000 g | PACK | Freq: Once | ORAL | Status: AC
  Administered 2020-09-29: 5 g via ORAL
  Filled 2020-09-29: qty 1

## 2020-09-29 MED ORDER — FAMOTIDINE 20 MG PO TABS
20.0000 mg | ORAL_TABLET | Freq: Every day | ORAL | Status: DC
  Administered 2020-09-30 – 2020-10-03 (×4): 20 mg via ORAL
  Filled 2020-09-29 (×4): qty 1

## 2020-09-29 MED ORDER — METHYLPREDNISOLONE SODIUM SUCC 40 MG IJ SOLR
40.0000 mg | Freq: Two times a day (BID) | INTRAMUSCULAR | Status: AC
  Administered 2020-09-29: 40 mg via INTRAVENOUS
  Filled 2020-09-29: qty 1

## 2020-09-29 MED ORDER — ATORVASTATIN CALCIUM 80 MG PO TABS
80.0000 mg | ORAL_TABLET | Freq: Every day | ORAL | Status: DC
  Administered 2020-09-29 – 2020-10-03 (×5): 80 mg via ORAL
  Filled 2020-09-29: qty 1
  Filled 2020-09-29 (×4): qty 4

## 2020-09-29 NOTE — Plan of Care (Signed)
Neuro: stable at baseline Resp: stable on 3L [place], productive cough, self suctioning with yaunker CV: afebrile, vital signs stable GIGU: foley in place, large BM, no emesis, tolerating PO well Skin: dry/flaky, scattered bruising Social: Daughter visited today, cut toenails, other daughter called for update-all questions and concerns addressed.   Problem: Clinical Measurements: Goal: Ability to maintain clinical measurements within normal limits will improve Outcome: Progressing Goal: Will remain free from infection Outcome: Progressing Goal: Diagnostic test results will improve Outcome: Progressing Goal: Respiratory complications will improve Outcome: Progressing Goal: Cardiovascular complication will be avoided Outcome: Progressing   Problem: Activity: Goal: Risk for activity intolerance will decrease Outcome: Progressing   Problem: Nutrition: Goal: Adequate nutrition will be maintained Outcome: Progressing   Problem: Elimination: Goal: Will not experience complications related to bowel motility Outcome: Progressing Goal: Will not experience complications related to urinary retention Outcome: Progressing

## 2020-09-29 NOTE — Progress Notes (Signed)
Progress Note  Patient Name: Kristin [name] Date of Encounter: 09/29/2020  Primary Cardiologist: Ida Rogue, MD  Subjective   No chest pain or sob. Feels like breathing is improving.  Inpatient Medications    Scheduled Meds: . amiodarone  400 mg Oral BID  . apixaban  5 mg Oral BID  . budesonide (PULMICORT) nebulizer solution  0.5 mg Nebulization BID  . chlorhexidine  15 mL Mouth Rinse BID  . Chlorhexidine Gluconate Cloth  6 each Topical Daily  . docusate sodium  100 mg Oral BID  . feeding supplement  237 mL Oral TID BM  . furosemide  40 mg Intravenous BID  . insulin aspart  0-20 Units Subcutaneous TID WC  . insulin aspart  0-5 Units Subcutaneous QHS  . ipratropium-albuterol  3 mL Nebulization Q4H  . levothyroxine  75 mcg Oral Q0600  . mouth rinse  15 mL Mouth Rinse q12n4p  . methylPREDNISolone (SOLU-MEDROL) injection  40 mg Intravenous Q12H  . multivitamin with minerals  1 tablet Oral Daily  . polyethylene glycol  17 g Oral Daily  . sodium chloride flush  3 mL Intravenous Q12H  . sodium zirconium cyclosilicate  5 g Oral Once  . tiotropium  18 mcg Inhalation Daily   Continuous Infusions: . sodium chloride 5 mL/hr at 09/29/20 0500  . ampicillin-sulbactam (UNASYN) IV Stopped (09/29/20 0216)  . famotidine (PEPCID) IV 100 mL/hr at 09/29/20 0500   PRN Meds: sodium chloride, acetaminophen, albuterol, ALPRAZolam, ondansetron (ZOFRAN) IV, prochlorperazine, sodium chloride flush   Vital Signs    Vitals:   09/29/20 0530 09/29/20 0545 09/29/20 0600 09/29/20 0615  BP:   (!) 140/58   Pulse: 66 67 64 64  Resp: 16 17 15 16   Temp:      TempSrc:      SpO2: 100% 100% 100% 100%  Weight:      Height:        Intake/Output Summary (Last 24 hours) at 09/29/2020 0827 Last data filed at 09/29/2020 0500 Gross per 24 hour  Intake 1594.76 ml  Output 2755 ml  Net -1160.24 ml   Filed Weights   09/27/20 0358 09/28/20 0500 09/29/20 0428  Weight: 115.7 kg 113.1 kg 111.8 kg     Physical Exam   GEN: obese, in no acute distress.  HEENT: Grossly normal.  Neck: Supple, obese - difficult to gauge JVP.   Cardiac: RRR, 2/6 SEM @ upper sternal borders - cannot appreciate S2.  No rubs, or gallops. No clubbing, cyanosis, edema.  DP/PT 2+ and equal bilaterally.  Respiratory:  Respirations regular and unlabored, scattered rhonchi w/ faint insp/exp wheezing throughout.   GI: Obese, semi-firm around pannus.  Non-tender, BS + x 4. MS: no deformity or atrophy. Skin: warm and dry, no rash. Neuro:  Strength and sensation are intact. Psych: AAOx3.  Normal affect.  Labs    Chemistry Recent Labs  Lab 09/25/20 2158 09/26/20 0150 09/27/20 0313 09/28/20 0416 09/29/20 0339  NA 126*   < > 131* 138 139  K 5.9*   < > 5.4* 5.1 5.5*  CL 86*   < > 88* 90* 89*  CO2 31   < > 33* 37* 42*  GLUCOSE 155*   < > 181* 137* 148*  BUN 58*   < > 56* 54* 54*  CREATININE 2.16*   < > 1.69* 1.38* 1.14*  CALCIUM 8.0*   < > 8.8* 8.9 9.3  PROT 6.9  --  7.3  --   --  ALBUMIN 3.0*  --  3.1*  --   --   AST 32  --  27  --   --   ALT 24  --  24  --   --   ALKPHOS 42  --  44  --   --   BILITOT 0.5  --  0.6  --   --   GFRNONAA 24*   < > 33* 42* 52*  ANIONGAP 9   < > 10 11 8    < > = values in this interval not displayed.     Hematology Recent Labs  Lab 09/27/20 0313 09/28/20 0416 09/29/20 0339  WBC 13.4* 7.4 9.3  RBC 3.91 3.59* 3.81*  HGB 11.4* 10.4* 11.1*  HCT 36.5 34.0* 35.5*  MCV 93.4 94.7 93.2  MCH 29.2 29.0 29.1  MCHC 31.2 30.6 31.3  RDW 14.6 14.4 14.5  PLT 364 332 375    Cardiac Enzymes  Recent Labs  Lab 09/25/20 2158 09/25/20 2357 09/27/20 0313 09/27/20 0502  TROPONINIHS 12 11 18* 19*      BNP Recent Labs  Lab 09/25/20 2157 09/27/20 0313  BNP 1,972.2* 1,737.3*    Lipids  Lab Results  Component Value Date   CHOL 165 06/27/2020   HDL 61 06/27/2020   LDLCALC 91 06/27/2020   TRIG 64 06/27/2020   CHOLHDL 2.7 06/27/2020    HbA1c  Lab Results  Component  Value Date   HGBA1C 6.2 (H) 09/26/2020    Radiology    DG Chest 1 View  Result Date: 09/27/2020 CLINICAL DATA:  Vomiting, aspiration EXAM: CHEST  1 VIEW COMPARISON:  Sep 25, 2020 FINDINGS: Similar cardiomegaly. Increased bilateral peribronchial and interstitial thickening. No visible pleural effusion or pneumothorax. Proximal right humerus fracture again visualized. Thoracic spondylosis. IMPRESSION: Cardiomegaly with increased bilateral peribronchial and interstitial thickening, which may be related to increasing pulmonary edema and/or reported aspiration. Electronically Signed   By: Dahlia Bailiff MD   On: 09/27/2020 03:03   DG Abd 1 View  Result Date: 09/27/2020 CLINICAL DATA:  Vomiting/aspiration EXAM: ABDOMEN - 1 VIEW COMPARISON:  CT April 08, 2020. FINDINGS: Gaseous distension of the stomach with bowel gas visualized to the level of the rectum. No pathologically dilated loops of small or large bowel visualized. IMPRESSION: Gaseous distension of the stomach. No pathologically dilated loops of small or large bowel visualized. Electronically Signed   By: Dahlia Bailiff MD   On: 09/27/2020 03:06   CT HEAD WO CONTRAST  Result Date: 09/26/2020 CLINICAL DATA:  70 year old female with altered mental status. Lung cancer. EXAM: CT HEAD WITHOUT CONTRAST TECHNIQUE: Contiguous axial images were obtained from the base of the skull through the vertex without intravenous contrast. COMPARISON:  Brain MRI dated 05/26/2020. FINDINGS: Evaluation of this exam is limited due to motion artifact. Brain: The ventricles and sulci are appropriate size for patient's age. Focal area of white matter hypodensity in the right corona radiata consistent with chronic microvascular ischemic changes and seen on the prior MRI. There is no acute intracranial hemorrhage. No mass effect or midline shift. No extra-axial fluid collection. Vascular: No hyperdense vessel or unexpected calcification. Skull: Normal. Negative for fracture  or focal lesion. Sinuses/Orbits: No acute finding. Other: None IMPRESSION: 1. No acute intracranial pathology. 2. Chronic microvascular ischemic changes. Electronically Signed   By: Anner Crete M.D.   On: 09/26/2020 03:06   US Venous Img Lower Bilateral (DVT)  Result Date: 09/26/2020 CLINICAL DATA:  Acute respiratory failure. Hypoxemia. Tobacco use. Malignancy of  the lung. Hormone therapy and anticoagulation therapy. EXAM: BILATERAL LOWER EXTREMITY VENOUS DOPPLER ULTRASOUND TECHNIQUE: Gray-scale sonography with graded compression, as well as color Doppler and duplex ultrasound were performed to evaluate the lower extremity deep venous systems from the level of the common femoral vein and including the common femoral, femoral, profunda femoral, popliteal and calf veins including the posterior tibial, peroneal and gastrocnemius veins when visible. The superficial great saphenous vein was also interrogated. Spectral Doppler was utilized to evaluate flow at rest and with distal augmentation maneuvers in the common femoral, femoral and popliteal veins. COMPARISON:  None. FINDINGS: RIGHT LOWER EXTREMITY Common Femoral Vein: No evidence of thrombus. Normal compressibility, respiratory phasicity and response to augmentation. Saphenofemoral Junction: No evidence of thrombus. Normal compressibility and flow on color Doppler imaging. Profunda Femoral Vein: No evidence of thrombus. Normal compressibility and flow on color Doppler imaging. Femoral Vein: No evidence of thrombus. Normal compressibility, respiratory phasicity and response to augmentation. Popliteal Vein: No evidence of thrombus. Normal compressibility, respiratory phasicity and response to augmentation. Calf Veins: No evidence of thrombus. Normal compressibility and flow on color Doppler imaging. Superficial Great Saphenous Vein: No evidence of thrombus. Normal compressibility. Venous Reflux:  None. Other Findings:  None. LEFT LOWER EXTREMITY Common  Femoral Vein: No evidence of thrombus. Normal compressibility, respiratory phasicity and response to augmentation. Saphenofemoral Junction: No evidence of thrombus. Normal compressibility and flow on color Doppler imaging. Profunda Femoral Vein: No evidence of thrombus. Normal compressibility and flow on color Doppler imaging. Femoral Vein: No evidence of thrombus. Normal compressibility, respiratory phasicity and response to augmentation. Popliteal Vein: No evidence of thrombus. Normal compressibility, respiratory phasicity and response to augmentation. Calf Veins: No evidence of thrombus. Normal compressibility and flow on color Doppler imaging. Superficial Great Saphenous Vein: No evidence of thrombus. Normal compressibility. Venous Reflux:  None. Other Findings:  None. IMPRESSION: No evidence of deep venous thrombosis in either lower extremity. Electronically Signed   By: Iven Finn M.D.   On: 09/26/2020 06:39   DG Chest Portable 1 View  Result Date: 09/25/2020 CLINICAL DATA:  Shortness of breath. EXAM: PORTABLE CHEST 1 VIEW COMPARISON:  Chest radiograph 05/29/2020 FINDINGS: Cardiomegaly with equivocal progression. Increasing peribronchial and interstitial thickening. No pneumothorax or large pleural effusion. Previous nodule in the superior segment of the right lower lobe is not well seen on the current exam. Right proximal humerus fracture, seen on recent humerus radiographs. IMPRESSION: Cardiomegaly with equivocal progression. Increasing peribronchial and interstitial thickening may be pulmonary edema or infection. Overall findings suggest CHF. Electronically Signed   By: Keith Rake M.D.   On: 09/25/2020 22:19    Telemetry    RSR, freq PACs, PVCs w/ up to 3 beats NSVT, brief runs of afib - seconds to under a minute this AM (during exam) - Personally Reviewed  Cardiac Studies   Cardiac Catheterization 11.2021  Left Main  Vessel is angiographically normal.  Left Anterior Descending   Mid LAD lesion is 20% stenosed. The lesion is mildly calcified.  Left Circumflex  Prox Cx lesion is 60% stenosed. The lesion is moderately calcified.  Mid Cx to Dist Cx lesion is 20% stenosed.  Second Obtuse Marginal Branch  Vessel is angiographically normal.  Third Obtuse Marginal Branch  Vessel is angiographically normal.  Right Coronary Artery  Prox RCA lesion is 30% stenosed. The lesion is mildly calcified.  Mid RCA lesion is 40% stenosed. The lesion is mildly calcified.  Right Posterior Descending Artery  RPDA lesion is 30% stenosed.  _____________  2D Echocardiogram 5.13.2022   1. Left ventricular ejection fraction, by estimation, is >55%. The left  ventricle has normal function. The left ventricle has no regional wall  motion abnormalities. There is mild left ventricular hypertrophy. Left  ventricular diastolic function could  not be evaluated.   2. Right ventricular systolic function is normal. The right ventricular  size is mildly enlarged.   3. The mitral valve was not well visualized. Unable to accurately assess  mitral valve regurgitation. Moderate mitral annular calcification.   4. The aortic valve was not well visualized. Aortic valve regurgitation  not well assessed. Aortic valve gradient unable to be assessed.  _____________   Patient Profile     70 y.o. 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  Assessment & Plan    1.  Acute on chronic HFpEF:  Pt admitted 5/13 w/ progressive dyspnea/orthopnea/wt gain/inc abd girth. EF >55% this admission (known mod - severe AS,  though not well-visualized on this echo).  She has responded well to IV diuresis.  Minus 1.2L overnight and minus 8.4L since admission.  Wt down to 111.8 kg (admission wt 119 kg). Dry wt ? 104.3kg (4/29).  Exam remains challenging, though her lower abd continues to feel full.  BUN/Creat improving w/ diuresis.   Will continue lasix 40 IV BID today and f/u renal fxn in AM.  BP trending 140's to 160's.  Follow w/ diuresis.  2.  PAF/flutter:  Pt cont to have brief runs of afib, often just a few seconds.  Sl longer episode during exam today - asymptomatic.  No prolonged, post-termination pauses.  Cont amio and eliquis.  Avoiding other AVN due to pauses/bradycardia.  3.  Bradycardia/Pauses:  No significant pauses in past 24 hrs.  Avoiding AVN blocking agents (on amio to limit afib/post-termination pauses).  4.  Demand Ischemia/Nonobstructive CAD:  HsTrop minimally elevated @ 19 in the setting of above.  No chest pain.  Nonobs LAD dzs on cath 03/2020. Cont med rx - resume statin.  No asa 2/2 ongoing eliquis.  5.  Essential HTN:  As above, BPs mod elevated.  Cont to follow in setting of diuresis. Reluctant to be too aggressive in setting of mod-sev AS.  6.  HL:  Resume home dose of statin.  7.  SCC of RLL:  outpt oncology f/u. Seen by palliative care.  8. COPD:  Steroids/nebs per medicine. Abx d/c'd this AM.  9.  Hyperkalemia: 5.5 this AM.  Mgmt per medicine.  10.  AKI:  Creat improved w/ diuresis. Follow.  Signed, Murray Hodgkins, NP  09/29/2020, 8:27 AM    For questions or updates, please contact   Please consult [email or web address] for contact info under Cardiology/STEMI.

## 2020-09-29 NOTE — Progress Notes (Signed)
PHARMACIST - PHYSICIAN COMMUNICATION  DR:  Broadus John  CONCERNING: IV to Oral Route Change Policy  RECOMMENDATION: This patient is receiving Pepcid by the intravenous route.  Based on criteria approved by the Pharmacy and Therapeutics Committee, the intravenous medication(s) is/are being converted to the equivalent oral dose form(s).   DESCRIPTION: These criteria include:  The patient is eating (either orally or via tube) and/or has been taking other orally administered medications for a least 24 hours  The patient has no evidence of active gastrointestinal bleeding or impaired GI absorption (gastrectomy, short bowel, patient on TNA or NPO).  If you have questions about this conversion, please contact the Pharmacy Department  []   [phone] )  [facility] [x]   [phone] )  [facility] []   [phone] )  [facility] []   [phone] )  [facility] []   [phone] )  [place], [facility] 09/29/2020 7:27 PM

## 2020-09-29 NOTE — Plan of Care (Signed)
  Problem: Clinical Measurements: Goal: Ability to maintain clinical measurements within normal limits will improve Outcome: Progressing Goal: Will remain free from infection Outcome: Progressing Goal: Diagnostic test results will improve Outcome: Progressing Goal: Respiratory complications will improve Outcome: Progressing Goal: Cardiovascular complication will be avoided Outcome: Progressing   Problem: Activity: Goal: Risk for activity intolerance will decrease Outcome: Progressing   Problem: Nutrition: Goal: Adequate nutrition will be maintained Outcome: Progressing   Problem: Elimination: Goal: Will not experience complications related to bowel motility Outcome: Progressing Goal: Will not experience complications related to urinary retention Outcome: Progressing

## 2020-09-29 NOTE — Progress Notes (Signed)
PT Cancellation Note  Patient Details Name: Amery Vandenbos Zaremba MRN: [id number] DOB: Jun 01, 1950   Cancelled Treatment:    Reason Eval/Treat Not Completed: Medical issues which prohibited therapy: Pt's recent Ka 5.5 and trending up falling outside guidelines for participation with PT services.  Will attempt to see pt at a future date/time as medically appropriate.     Linus Salmons PT, DPT 09/29/20, 2:33 PM

## 2020-09-29 NOTE — Progress Notes (Signed)
Follow up visit today with daughter bedside. Patient states she feels better today, she was laughing and engaged with daughter during my visit.

## 2020-09-29 NOTE — Progress Notes (Signed)
1900: Received report and assumed pt care. Patient had an acute event at shift change, possible aspiration per previous shift nurse. Required 5L [place] for short period of time. Expiratory wheezes rt side, diminished left. Productive cough. 2100: Patient back on baseline 3L [place]. No acute assessment findings. Patient has Fentanyl patch on lt upper arm for pain control. Multiple PVCs, paired PVCs. Asymptomatic.  0130: No changes in patient head to toe assessment at this time. Report to be given to Roselyn Reef, RN and patient care transferred.

## 2020-09-29 NOTE — Telephone Encounter (Signed)
Daughter called to make Dr. Sondra Come aware that patient is currently admitted to [facility] in ICU. Patient was admitted on 09/25/20.

## 2020-09-29 NOTE — Telephone Encounter (Signed)
Pt's daughter [name] called to let our office know that the pt is currently hospitalized at [facility] ICU since 09/25/20.

## 2020-09-29 NOTE — Progress Notes (Signed)
Spoke with patient daughter via telephone, as well as patient. Update given. No concerns voiced at that time.

## 2020-09-29 NOTE — Progress Notes (Signed)
PROGRESS NOTE    Kristin [name]  WYO:[id number] DOB: [birth year] DOA: 09/25/2020 PCP: [place]  Brief Narrative: Kristin [name] is a chronically ill morbidly obese female with history of nonobstructive CAD, severe aortic stenosis being evaluated for TAVR, chronic diastolic CHF, advanced COPD/chronic respiratory failure on 3 L home O2, history of atrial flutter on amiodarone and apixaban, OSA not on CPAP, hypothyroidism, morbid obesity, right lower lobe lung cancer status post XRT, diabetes, right humeral fracture presented to the ED on 5/12 with respiratory distress, she was noted to be tachypneic and treated with BiPAP briefly she was admitted to the ICU, chest x-ray was concerning for pulmonary vascular congestion, COVID and flu PCR were negative, she was treated with IV Lasix duo nebs and steroids also  received a dose of Narcan. -Transferred to [facility] service today 5/14   Assessment & Plan:   Acute on chronic hypoxic and hypercarbic respiratory failure Acute on chronic diastolic CHF, severe Aortic stenosis COPD exacerbation/end-stage COPD OSA/OHS Aspiration pneumonia -Multifactorial respiratory failure likely worsened by recent opiate use -Treated with BiPAP on admission and then weaned down to 4 L, overnight-5/14, vomited and had worsening hypoxia concerning for aspiration, started on Unasyn and high flow nasal cannula, continue IV Unasyn, day 3/5 today -Diuresing well, remains on IV Lasix she is -8.4 L  -We will cut down IV steroids, transition to prednisone -Palliative care following as well, now DNR, high risk of decompensation and quick readmission, including demise  Acute diastolic CHF Severe aortic stenosis -Continue IV Lasix today -Being evaluated as outpatient for TAVR work-up which has been deferred  Paroxysmal atrial flutter/A. Fib with RVR and bradycardia -Was bradycardic on admission requiring atropine -Coreg and amiodarone were held initially -Amiodarone  resumed, continue Eliquis  Acute metabolic encephalopathy -Multifactorial, chronically hypercarbic from advanced COPD and untreated OSA/OHS -off BiPAP due to vomiting/aspiration event overnight 5/14 -Improved -DNR, discontinued fentanyl patch  AKI on CKD 3 -Hyperkalemia -Likely cardiorenal, improving with diuresis  Closed fracture of proximal end of right humerus 4/29 -keeps arm immobilized but refuses sling -Tylenol as needed  Type 2 diabetes mellitus -CBGs are stable, on sliding scale insulin only for now  Non-small cell lung cancer Heavy tobacco abuse -Recently diagnosed, treated with XRT in 3/22  Obesity -BMI -45.6  Hypothyroidism -Continue Synthroid  DVT prophylaxis: Full dose Lovenox Code Status: DNR Family Communication: No family at bedside, called and updated daughter Kristin [name] 5/15, discussed poor prognosis Disposition Plan:  Status is: Inpatient  Remains inpatient appropriate because:Inpatient level of care appropriate due to severity of illness   Dispo: The patient is from: Home              Anticipated d/c is to: SNF              Patient currently is not medically stable to d/c.   Difficult to place patient No   Consultants:   PCCM  Cardiology   Procedures: BiPAP  Antimicrobials:    Subjective: -Breathing better, off high flow nasal cannula, down to 3 L this morning  Objective: Vitals:   09/29/20 0600 09/29/20 0615 09/29/20 0800 09/29/20 1000  BP: (!) 140/58  (!) 169/69 118/90  Pulse: 64 64 82 78  Resp: 15 16 20 18   Temp:      TempSrc:      SpO2: 100% 100% 100% 96%  Weight:      Height:        Intake/Output Summary (Last 24 hours) at 09/29/2020 1055 Last  data filed at 09/29/2020 1011 Gross per 24 hour  Intake 1299.15 ml  Output 2505 ml  Net -1205.85 ml   Filed Weights   09/27/20 0358 09/28/20 0500 09/29/20 0428  Weight: 115.7 kg 113.1 kg 111.8 kg    Examination:  General exam: Chronically ill morbidly obese female, sitting up  in bed, awake alert oriented x3, off high flow nasal cannula this morning CVS: S1-S2, r irregularly irregular rhythm, systolic ejection murmur Lungs: Poor air movement bilaterally, scattered basilar Rales Abdomen: Obese, soft, nontender, bowel sounds present Extremities: No edema, right arm immobilized Skin: No rash on exposed skin Psych: Flat affect   Data Reviewed:   CBC: Recent Labs  Lab 09/25/20 2158 09/26/20 0425 09/27/20 0313 09/28/20 0416 09/29/20 0339  WBC 13.8* 10.5 13.4* 7.4 9.3  NEUTROABS 12.2*  --  12.8* 7.0 8.6*  HGB 10.8* 10.5* 11.4* 10.4* 11.1*  HCT 34.0* 31.9* 36.5 34.0* 35.5*  MCV 92.4 91.9 93.4 94.7 93.2  PLT 352 311 364 332 315   Basic Metabolic Panel: Recent Labs  Lab 09/26/20 0150 09/26/20 0425 09/27/20 0313 09/28/20 0416 09/29/20 0339  NA 126* 128* 131* 138 139  K 5.8* 5.1 5.4* 5.1 5.5*  CL 87* 89* 88* 90* 89*  CO2 29 30 33* 37* 42*  GLUCOSE 169* 195* 181* 137* 148*  BUN 57* 56* 56* 54* 54*  CREATININE 2.05* 1.98* 1.69* 1.38* 1.14*  CALCIUM 7.9* 7.9* 8.8* 8.9 9.3  MG 1.9  --  2.1 1.9  --   PHOS  --   --  5.2* 4.1  --    GFR: Estimated Creatinine Clearance: 55.8 mL/min (A) (by C-G formula based on SCr of 1.14 mg/dL (H)). Liver Function Tests: Recent Labs  Lab 09/25/20 2158 09/27/20 0313  AST 32 27  ALT 24 24  ALKPHOS 42 44  BILITOT 0.5 0.6  PROT 6.9 7.3  ALBUMIN 3.0* 3.1*   No results for input(s): LIPASE, AMYLASE in the last 168 hours. No results for input(s): AMMONIA in the last 168 hours. Coagulation Profile: No results for input(s): INR, PROTIME in the last 168 hours. Cardiac Enzymes: No results for input(s): CKTOTAL, CKMB, CKMBINDEX, TROPONINI in the last 168 hours. BNP (last 3 results) No results for input(s): PROBNP in the last 8760 hours. HbA1C: No results for input(s): HGBA1C in the last 72 hours. CBG: Recent Labs  Lab 09/28/20 0740 09/28/20 1144 09/28/20 1614 09/28/20 2112 09/29/20 0740  GLUCAP 108* 187* 170*  208* 112*   Lipid Profile: No results for input(s): CHOL, HDL, LDLCALC, TRIG, CHOLHDL, LDLDIRECT in the last 72 hours. Thyroid Function Tests: No results for input(s): TSH, T4TOTAL, FREET4, T3FREE, THYROIDAB in the last 72 hours. Anemia Panel: No results for input(s): VITAMINB12, FOLATE, FERRITIN, TIBC, IRON, RETICCTPCT in the last 72 hours. Urine analysis:    Component Value Date/Time   COLORURINE YELLOW (A) 05/26/2020 1001   APPEARANCEUR CLEAR (A) 05/26/2020 1001   LABSPEC 1.017 05/26/2020 1001   PHURINE 5.0 05/26/2020 1001   GLUCOSEU NEGATIVE 05/26/2020 1001   HGBUR NEGATIVE 05/26/2020 1001   BILIRUBINUR NEGATIVE 05/26/2020 1001   KETONESUR NEGATIVE 05/26/2020 1001   PROTEINUR 30 (A) 05/26/2020 1001   NITRITE NEGATIVE 05/26/2020 1001   LEUKOCYTESUR NEGATIVE 05/26/2020 1001   Sepsis Labs: @LABRCNTIP (procalcitonin:4,lacticidven:4)  ) Recent Results (from the past 240 hour(s))  Resp Panel by RT-PCR (Flu A&B, Covid) Nasopharyngeal Swab     Status: None   Collection Time: 09/25/20  9:57 PM   Specimen: Nasopharyngeal Swab; Nasopharyngeal(NP) swabs  in vial transport medium  Result Value Ref Range Status   SARS Coronavirus 2 by RT PCR NEGATIVE NEGATIVE Final    Comment: (NOTE) SARS-CoV-2 target nucleic acids are NOT DETECTED.  The SARS-CoV-2 RNA is generally detectable in upper respiratory specimens during the acute phase of infection. The lowest concentration of SARS-CoV-2 viral copies this assay can detect is 138 copies/mL. A negative result does not preclude SARS-Cov-2 infection and should not be used as the sole basis for treatment or other patient management decisions. A negative result may occur with  improper specimen collection/handling, submission of specimen other than nasopharyngeal swab, presence of viral mutation(s) within the areas targeted by this assay, and inadequate number of viral copies(<138 copies/mL). A negative result must be combined with clinical  observations, patient history, and epidemiological information. The expected result is Negative.  Fact Sheet for Patients:  EntrepreneurPulse.com.au  Fact Sheet for Healthcare Providers:  IncredibleEmployment.be  This test is no t yet approved or cleared by the [place] FDA and  has been authorized for detection and/or diagnosis of SARS-CoV-2 by FDA under an Emergency Use Authorization (EUA). This EUA will remain  in effect (meaning this test can be used) for the duration of the COVID-19 declaration under Section 564(b)(1) of the Act, 21 U.S.C.section 360bbb-3(b)(1), unless the authorization is terminated  or revoked sooner.       Influenza A by PCR NEGATIVE NEGATIVE Final   Influenza B by PCR NEGATIVE NEGATIVE Final    Comment: (NOTE) The Xpert Xpress SARS-CoV-2/FLU/RSV plus assay is intended as an aid in the diagnosis of influenza from Nasopharyngeal swab specimens and should not be used as a sole basis for treatment. Nasal washings and aspirates are unacceptable for Xpert Xpress SARS-CoV-2/FLU/RSV testing.  Fact Sheet for Patients: EntrepreneurPulse.com.au  Fact Sheet for Healthcare Providers: IncredibleEmployment.be  This test is not yet approved or cleared by the [place] FDA and has been authorized for detection and/or diagnosis of SARS-CoV-2 by FDA under an Emergency Use Authorization (EUA). This EUA will remain in effect (meaning this test can be used) for the duration of the COVID-19 declaration under Section 564(b)(1) of the Act, 21 U.S.C. section 360bbb-3(b)(1), unless the authorization is terminated or revoked.  Performed at [facility], [address] [place], [place] [address]   MRSA PCR Screening     Status: None   Collection Time: 09/26/20  3:40 AM   Specimen: Nasopharyngeal  Result Value Ref Range Status   MRSA by PCR NEGATIVE NEGATIVE Final    Comment:         The GeneXpert MRSA Assay (FDA approved for NASAL specimens only), is one component of a comprehensive MRSA colonization surveillance program. It is not intended to diagnose MRSA infection nor to guide or monitor treatment for MRSA infections. Performed at [facility] [address] [place], [place] [address]      Scheduled Meds: . amiodarone  400 mg Oral BID  . apixaban  5 mg Oral BID  . atorvastatin  80 mg Oral Daily  . budesonide (PULMICORT) nebulizer solution  0.5 mg Nebulization BID  . chlorhexidine  15 mL Mouth Rinse BID  . Chlorhexidine Gluconate Cloth  6 each Topical Daily  . docusate sodium  100 mg Oral BID  . feeding supplement  237 mL Oral TID BM  . furosemide  40 mg Intravenous BID  . insulin aspart  0-20 Units Subcutaneous TID WC  . insulin aspart  0-5 Units Subcutaneous QHS  . ipratropium-albuterol  3 mL Nebulization Q4H  . levothyroxine  75 mcg Oral Q0600  . mouth rinse  15 mL Mouth Rinse q12n4p  . methylPREDNISolone (SOLU-MEDROL) injection  40 mg Intravenous Q12H  . multivitamin with minerals  1 tablet Oral Daily  . polyethylene glycol  17 g Oral Daily  . sodium chloride flush  3 mL Intravenous Q12H  . sodium zirconium cyclosilicate  5 g Oral Once  . tiotropium  18 mcg Inhalation Daily   Continuous Infusions: . sodium chloride 5 mL/hr at 09/29/20 0500  . ampicillin-sulbactam (UNASYN) IV 200 mL/hr at 09/29/20 1000  . famotidine (PEPCID) IV Stopped (09/29/20 0506)     LOS: 4 days    Time spent: 22min  Domenic Polite, MD [facility]  09/29/2020, 10:55 AM

## 2020-09-29 NOTE — Progress Notes (Addendum)
Daily Progress Note   Patient Name: Kristin [name]       Date: 09/29/2020 DOB: 1950-10-14  Age: 70 y.o. MRN#: [id number] Attending Physician: Domenic Polite, MD Primary Care Physician: [place] Date: 09/25/2020  Reason for Consultation/Follow-up: Establishing goals of care  Subjective: Patient is resting in bed. She is improved from my visit Friday and feels better. She has a sling in place and states her shoulder fracture is non op management. She states cardiology is waiting to see how she did with chemotherapy which she has just finished, to schedule her TAVR. She states she would like to proceed with the current plan of care.   Plan for f/u with oncology, and cardiology.    I would recommend outpatient palliative to follow for further [place] over time.    Length of Stay: 4  Current Medications: Scheduled Meds:  . amiodarone  400 mg Oral BID  . apixaban  5 mg Oral BID  . atorvastatin  80 mg Oral Daily  . budesonide (PULMICORT) nebulizer solution  0.5 mg Nebulization BID  . chlorhexidine  15 mL Mouth Rinse BID  . Chlorhexidine Gluconate Cloth  6 each Topical Daily  . docusate sodium  100 mg Oral BID  . feeding supplement  237 mL Oral TID BM  . furosemide  40 mg Intravenous BID  . insulin aspart  0-20 Units Subcutaneous TID WC  . insulin aspart  0-5 Units Subcutaneous QHS  . ipratropium-albuterol  3 mL Nebulization Q4H  . levothyroxine  75 mcg Oral Q0600  . mouth rinse  15 mL Mouth Rinse q12n4p  . methylPREDNISolone (SOLU-MEDROL) injection  40 mg Intravenous Q12H  . multivitamin with minerals  1 tablet Oral Daily  . polyethylene glycol  17 g Oral Daily  . sodium chloride flush  3 mL Intravenous Q12H  . sodium zirconium cyclosilicate  5 g Oral Once  .  tiotropium  18 mcg Inhalation Daily    Continuous Infusions: . sodium chloride 5 mL/hr at 09/29/20 0500  . ampicillin-sulbactam (UNASYN) IV Stopped (09/29/20 0216)  . famotidine (PEPCID) IV 100 mL/hr at 09/29/20 0500    PRN Meds: sodium chloride, acetaminophen, albuterol, ALPRAZolam, ondansetron (ZOFRAN) IV, prochlorperazine, sodium chloride flush  Physical Exam Pulmonary:     Effort: Pulmonary effort is normal.  Neurological:  Mental Status: She is alert.             Vital Signs: BP (!) 140/58   Pulse 64   Temp 98.2 F (36.8 C) (Oral)   Resp 16   Ht 5\' 2"  (1.575 m)   Wt 111.8 kg   LMP  (LMP Unknown)   SpO2 100%   BMI 45.08 kg/m  SpO2: SpO2: 100 % O2 Device: O2 Device: Nasal Cannula O2 Flow Rate: O2 Flow Rate (L/min): 3 L/min  Intake/output summary:   Intake/Output Summary (Last 24 hours) at 09/29/2020 0940 Last data filed at 09/29/2020 0500 Gross per 24 hour  Intake 1271.51 ml  Output 2755 ml  Net -1483.49 ml   LBM: Last BM Date: 09/26/20 Baseline Weight: Weight: 122.7 kg Most recent weight: Weight: 111.8 kg         Patient Active Problem List   Diagnosis Date Noted  . CAD (coronary artery disease) 09/26/2020  . Hyperkalemia 09/26/2020  . CHF exacerbation ([place]) 09/26/2020  . Acute on chronic respiratory failure with hypoxia and hypercapnia ([place]) 09/25/2020  . Bradycardia 09/25/2020  . Lung cancer ([place]) 05/26/2020  . Atrial flutter with rapid ventricular response ([place]) 05/26/2020  . Demand ischemia ([place])   . Acute on chronic heart failure with preserved ejection fraction (HFpEF) ([place])   . Aortic stenosis-moderate to severe 03/2020   . Obesity, Class III, BMI 40-49.9 (morbid obesity) ([place]) 03/29/2020  . Chronic respiratory failure with hypoxia ([place]) 03/29/2020  . OSA (obstructive sleep apnea) 03/29/2020  . Essential hypertension 03/29/2020  . Hypothyroidism 03/29/2020  . Chest pain 03/29/2020  . Elevated troponin 03/29/2020  . Chronic diastolic  (congestive) heart failure ([place]) 07/12/2018  . Type 2 diabetes mellitus without complication ([place]) 62/69/4854  . COPD with acute exacerbation ([place]) 07/12/2018  . Tobacco use 07/12/2018  . Hypotension 07/12/2018  . Respiratory failure ([place]) 07/04/2018    Palliative Care Assessment & Plan    Recommendations/Plan: Recommend outpatient palliative to follow for [place] over time.     Code Status:    Code Status Orders  (From admission, onward)         Start     Ordered   09/26/20 1628  Do not attempt resuscitation (DNR)  Continuous       Question Answer Comment  In the event of cardiac or respiratory ARREST Do not call a "code blue"   In the event of cardiac or respiratory ARREST Do not perform Intubation, CPR, defibrillation or ACLS   In the event of cardiac or respiratory ARREST Use medication by any route, position, wound care, and other measures to relive pain and suffering. May use oxygen, suction and manual treatment of airway obstruction as needed for comfort.   Comments DNI      09/26/20 1627        Code Status History    Date Active Date Inactive Code Status Order ID Comments User Context   09/26/2020 0041 09/26/2020 1627 Full Code [id number]  Athena Masse, MD ED   05/26/2020 1413 05/27/2020 1745 Full Code [id number]  Gwynne Edinger, MD ED   03/29/2020 0209 04/02/2020 1735 Full Code [id number]  Athena Masse, MD ED   07/04/2018 2312 07/07/2018 2001 Full Code [id number]  Salary, Avel Peace, MD ED   Advance Care Planning Activity    Advance Directive Documentation   Flowsheet Row Most Recent Value  Type of Advance Directive Healthcare Power of Attorney  Pre-existing out of facility DNR order (yellow form or  pink MOST form) --  "MOST" Form in Place? --      Prognosis: Poor overall    Care plan was discussed with cardiology, CCM, RN  Thank you for allowing the Palliative Medicine Team to assist in the care of this patient.   Total Time 35 min Prolonged Time  Billed  no      Greater than 50%  of this time was spent counseling and coordinating care related to the above assessment and plan.  Asencion Gowda, NP  Please contact Palliative Medicine Team phone at [phone] for questions and concerns.

## 2020-09-30 DIAGNOSIS — Z7189 Other specified counseling: Secondary | ICD-10-CM | POA: Diagnosis not present

## 2020-09-30 DIAGNOSIS — J9622 Acute and chronic respiratory failure with hypercapnia: Secondary | ICD-10-CM | POA: Diagnosis not present

## 2020-09-30 DIAGNOSIS — J9621 Acute and chronic respiratory failure with hypoxia: Secondary | ICD-10-CM | POA: Diagnosis not present

## 2020-09-30 DIAGNOSIS — Z515 Encounter for palliative care: Secondary | ICD-10-CM | POA: Diagnosis not present

## 2020-09-30 DIAGNOSIS — I350 Nonrheumatic aortic (valve) stenosis: Secondary | ICD-10-CM | POA: Diagnosis not present

## 2020-09-30 DIAGNOSIS — I5033 Acute on chronic diastolic (congestive) heart failure: Secondary | ICD-10-CM | POA: Diagnosis not present

## 2020-09-30 LAB — CBC WITH DIFFERENTIAL/PLATELET
Abs Immature Granulocytes: 0.07 10*3/uL (ref 0.00–0.07)
Basophils Absolute: 0 10*3/uL (ref 0.0–0.1)
Basophils Relative: 0 %
Eosinophils Absolute: 0 10*3/uL (ref 0.0–0.5)
Eosinophils Relative: 0 %
HCT: 36.1 % (ref 36.0–46.0)
Hemoglobin: 11.5 g/dL — ABNORMAL LOW (ref 12.0–15.0)
Immature Granulocytes: 1 %
Lymphocytes Relative: 5 %
Lymphs Abs: 0.4 10*3/uL — ABNORMAL LOW (ref 0.7–4.0)
MCH: 29.6 pg (ref 26.0–34.0)
MCHC: 31.9 g/dL (ref 30.0–36.0)
MCV: 92.8 fL (ref 80.0–100.0)
Monocytes Absolute: 0.4 10*3/uL (ref 0.1–1.0)
Monocytes Relative: 5 %
Neutro Abs: 7.7 10*3/uL (ref 1.7–7.7)
Neutrophils Relative %: 89 %
Platelets: 338 10*3/uL (ref 150–400)
RBC: 3.89 MIL/uL (ref 3.87–5.11)
RDW: 14.3 % (ref 11.5–15.5)
WBC: 8.6 10*3/uL (ref 4.0–10.5)
nRBC: 0 % (ref 0.0–0.2)

## 2020-09-30 LAB — GLUCOSE, CAPILLARY
Glucose-Capillary: 187 mg/dL — ABNORMAL HIGH (ref 70–99)
Glucose-Capillary: 91 mg/dL (ref 70–99)
Glucose-Capillary: 230 mg/dL — ABNORMAL HIGH (ref 70–99)
Glucose-Capillary: 177 mg/dL — ABNORMAL HIGH (ref 70–99)

## 2020-09-30 LAB — BASIC METABOLIC PANEL
Anion gap: 9 (ref 5–15)
BUN: 48 mg/dL — ABNORMAL HIGH (ref 8–23)
CO2: 43 mmol/L — ABNORMAL HIGH (ref 22–32)
Calcium: 9.2 mg/dL (ref 8.9–10.3)
Chloride: 87 mmol/L — ABNORMAL LOW (ref 98–111)
Creatinine, Ser: 1.09 mg/dL — ABNORMAL HIGH (ref 0.44–1.00)
GFR, Estimated: 55 mL/min — ABNORMAL LOW (ref >60)
Glucose, Bld: 160 mg/dL — ABNORMAL HIGH (ref 70–99)
Potassium: 4.9 mmol/L (ref 3.5–5.1)
Sodium: 139 mmol/L (ref 135–145)

## 2020-09-30 NOTE — TOC Initial Note (Signed)
Transition of Care [facility]) - Initial/Assessment Note    Patient Details  Name: Kristin [name] MRN: [id number] Date of Birth: May 29, 1950  [facility] CM/SW Contact:    Adelene Amas, [place] Phone Number: 09/30/2020, 4:08 PM  Clinical Narrative:                  Patient [facility] due to bradycardia. Patient has right humerus fracture. Patient's daughter Vanessa Barbara [phone] is main contact and HPOA.  Patient's SOB and renal function is improving. [facility] Care NP has spoken to the patient and Ms. Mashburn, patient remains DNR/DNI.  Pt has been consulted but have been unable to evaluate the patient.  TOC will continue to follow.  Expected Discharge Plan: [place] Barriers to Discharge: Continued Medical Work up   Patient Goals and CMS Choice        Expected Discharge Plan and Services Expected Discharge Plan: [place] In-house Referral: Clinical Social Work     Living arrangements for the past 2 months: [place]                                      Prior Living Arrangements/Services Living arrangements for the past 2 months: Single Family Home Lives with:: Self Patient language and need for interpreter reviewed:: Yes Do you feel safe going back to the place where you live?: Yes      Need for Family Participation in Patient Care: Yes (Comment) Care giver support system in place?: Yes (comment)   Criminal Activity/Legal Involvement Pertinent to Current Situation/Hospitalization: No - Comment as needed  Activities of Daily Living Home Assistive Devices/Equipment: Cane (specify quad or straight),Sling (specify type) ADL Screening (condition at time of admission) Patient's cognitive ability adequate to safely complete daily activities?: Yes Is the patient deaf or have difficulty hearing?: No Does the patient have difficulty seeing, even when wearing glasses/contacts?: No Does the patient have  difficulty concentrating, remembering, or making decisions?: No Patient able to express need for assistance with ADLs?: Yes Does the patient have difficulty dressing or bathing?: Yes Independently performs ADLs?: No Communication: Independent Dressing (OT): Needs assistance Is this a change from baseline?: Pre-admission baseline Grooming: Needs assistance Is this a change from baseline?: Pre-admission baseline Feeding: Needs assistance Is this a change from baseline?: Pre-admission baseline Bathing: Needs assistance Is this a change from baseline?: Pre-admission baseline Toileting: Needs assistance Is this a change from baseline?: Pre-admission baseline In/Out Bed: Needs assistance Is this a change from baseline?: Pre-admission baseline Walks in Home: Independent with device (comment) Does the patient have difficulty walking or climbing stairs?: Yes Weakness of Legs: Both Weakness of Arms/Hands: Right  Permission Sought/Granted Permission sought to share information with : Facility Sport and exercise psychologist    Share Information with NAME: Vanessa Barbara (Daughter)   [phone]           Emotional Assessment Appearance:: Appears stated age Attitude/Demeanor/Rapport: Engaged Affect (typically observed): Stable Orientation: : Oriented to Self,Oriented to Place,Oriented to  Time,Oriented to Situation Alcohol / Substance Use: Not Applicable Psych Involvement: No (comment)  Admission diagnosis:  COPD exacerbation (HCC) [J44.1] CHF exacerbation (HCC) [I50.9] Acute on chronic diastolic congestive heart failure (HCC) [I50.33] Acute respiratory failure with hypoxemia (HCC) [J96.01] Acute on chronic respiratory failure with hypoxia and hypercapnia ([place]) [S06.30, J96.22] Patient Active Problem List   Diagnosis Date Noted  .  CAD (coronary artery disease) 09/26/2020  . Hyperkalemia 09/26/2020  . CHF exacerbation ([place]) 09/26/2020  . Acute on chronic respiratory failure with hypoxia  and hypercapnia ([place]) 09/25/2020  . Bradycardia 09/25/2020  . Lung cancer ([place]) 05/26/2020  . Atrial flutter with rapid ventricular response ([place]) 05/26/2020  . Demand ischemia ([place])   . Acute on chronic heart failure with preserved ejection fraction (HFpEF) ([place])   . Aortic stenosis-moderate to severe 03/2020   . Obesity, Class III, BMI 40-49.9 (morbid obesity) ([place]) 03/29/2020  . Chronic respiratory failure with hypoxia ([place]) 03/29/2020  . OSA (obstructive sleep apnea) 03/29/2020  . Essential hypertension 03/29/2020  . Hypothyroidism 03/29/2020  . Chest pain 03/29/2020  . Elevated troponin 03/29/2020  . Chronic diastolic (congestive) heart failure ([place]) 07/12/2018  . Type 2 diabetes mellitus without complication ([place]) 29/47/6546  . COPD with acute exacerbation ([place]) 07/12/2018  . Tobacco use 07/12/2018  . Hypotension 07/12/2018  . Respiratory failure ([place]) 07/04/2018   PCP:  [place] [facility]:   [id number] - [place], [place] [place] [address] Phone: [phone] Fax: [phone]     Social Determinants of Health (SDOH) Interventions    Readmission Risk Interventions No flowsheet data found.

## 2020-09-30 NOTE — Progress Notes (Signed)
Patient up to bedside commode x 3. First 2 voids had a small amount of stool, unable to measure. Foley removed prior to shift change and patient refused external catheter at this time for accurate input/output measurements. Will continue to monitor.

## 2020-09-30 NOTE — Progress Notes (Signed)
Daily Progress Note   Patient Name: Kristin [name]       Date: 09/30/2020 DOB: Apr 24, 1951  Age: 70 y.o. MRN#: [id number] Attending Physician: Domenic Polite, MD Primary Care Physician: [place] Date: 09/25/2020  Reason for Consultation/Follow-up: Establishing goals of care  Subjective: Called to bedside to speak with patient and daughter. Patient is resting in bed. Her daughter and HPOA [name] is at bedside. [name] is a [employer]. Patient asks questions about DNR/DNI. Discussed QOL vs quantity. Questions answered. Patient would like to maintain DNR/DNI.    Length of Stay: 5  Current Medications: Scheduled Meds:  . amiodarone  400 mg Oral BID  . apixaban  5 mg Oral BID  . atorvastatin  80 mg Oral Daily  . budesonide (PULMICORT) nebulizer solution  0.5 mg Nebulization BID  . chlorhexidine  15 mL Mouth Rinse BID  . Chlorhexidine Gluconate Cloth  6 each Topical Daily  . docusate sodium  100 mg Oral BID  . famotidine  20 mg Oral Daily  . feeding supplement  237 mL Oral TID BM  . furosemide  40 mg Intravenous BID  . insulin aspart  0-20 Units Subcutaneous TID WC  . insulin aspart  0-5 Units Subcutaneous QHS  . ipratropium-albuterol  3 mL Nebulization Q4H  . levothyroxine  75 mcg Oral Q0600  . mouth rinse  15 mL Mouth Rinse q12n4p  . multivitamin with minerals  1 tablet Oral Daily  . polyethylene glycol  17 g Oral Daily  . predniSONE  50 mg Oral Q breakfast  . sodium chloride flush  3 mL Intravenous Q12H  . tiotropium  18 mcg Inhalation Daily    Continuous Infusions: . sodium chloride 5 mL/hr at 09/30/20 0600  . ampicillin-sulbactam (UNASYN) IV 3 g (09/30/20 1440)    PRN Meds: sodium chloride, acetaminophen, albuterol, ALPRAZolam, ondansetron (ZOFRAN) IV,  prochlorperazine, sodium chloride flush  Physical Exam Pulmonary:     Effort: Pulmonary effort is normal.  Neurological:     Mental Status: She is alert.             Vital Signs: BP (!) 161/67   Pulse 82   Temp 97.9 F (36.6 C) (Oral)   Resp (!) 24   Ht 5\' 2"  (1.575 m)   Wt 112.1 kg   LMP  (  LMP Unknown)   SpO2 96%   BMI 45.20 kg/m  SpO2: SpO2: 96 % O2 Device: O2 Device: Nasal Cannula O2 Flow Rate: O2 Flow Rate (L/min): 2 L/min  Intake/output summary:   Intake/Output Summary (Last 24 hours) at 09/30/2020 1523 Last data filed at 09/30/2020 1419 Gross per 24 hour  Intake 1734.76 ml  Output 2450 ml  Net -715.24 ml   LBM: Last BM Date: 09/29/20 Baseline Weight: Weight: 122.7 kg Most recent weight: Weight: 112.1 kg         Patient Active Problem List   Diagnosis Date Noted  . CAD (coronary artery disease) 09/26/2020  . Hyperkalemia 09/26/2020  . CHF exacerbation ([place]) 09/26/2020  . Acute on chronic respiratory failure with hypoxia and hypercapnia ([place]) 09/25/2020  . Bradycardia 09/25/2020  . Lung cancer ([place]) 05/26/2020  . Atrial flutter with rapid ventricular response ([place]) 05/26/2020  . Demand ischemia ([place])   . Acute on chronic heart failure with preserved ejection fraction (HFpEF) ([place])   . Aortic stenosis-moderate to severe 03/2020   . Obesity, Class III, BMI 40-49.9 (morbid obesity) ([place]) 03/29/2020  . Chronic respiratory failure with hypoxia ([place]) 03/29/2020  . OSA (obstructive sleep apnea) 03/29/2020  . Essential hypertension 03/29/2020  . Hypothyroidism 03/29/2020  . Chest pain 03/29/2020  . Elevated troponin 03/29/2020  . Chronic diastolic (congestive) heart failure ([place]) 07/12/2018  . Type 2 diabetes mellitus without complication ([place]) 56/38/9373  . COPD with acute exacerbation ([place]) 07/12/2018  . Tobacco use 07/12/2018  . Hypotension 07/12/2018  . Respiratory failure ([place]) 07/04/2018    Palliative Care Assessment & Plan    Recommendations/Plan: Recommend palliative to follow outpatient. Continue DNR/DNI.     Code Status:    Code Status Orders  (From admission, onward)         Start     Ordered   09/26/20 1628  Do not attempt resuscitation (DNR)  Continuous       Question Answer Comment  In the event of cardiac or respiratory ARREST Do not call a "code blue"   In the event of cardiac or respiratory ARREST Do not perform Intubation, CPR, defibrillation or ACLS   In the event of cardiac or respiratory ARREST Use medication by any route, position, wound care, and other measures to relive pain and suffering. May use oxygen, suction and manual treatment of airway obstruction as needed for comfort.   Comments DNI      09/26/20 1627        Code Status History    Date Active Date Inactive Code Status Order ID Comments User Context   09/26/2020 0041 09/26/2020 1627 Full Code [id number]  Athena Masse, MD ED   05/26/2020 1413 05/27/2020 1745 Full Code [id number]  Gwynne Edinger, MD ED   03/29/2020 0209 04/02/2020 1735 Full Code [id number]  Athena Masse, MD ED   07/04/2018 2312 07/07/2018 2001 Full Code [id number]  Salary, Avel Peace, MD ED   Advance Care Planning Activity    Advance Directive Documentation   Flowsheet Row Most Recent Value  Type of Advance Directive Healthcare Power of Attorney  Pre-existing out of facility DNR order (yellow form or pink MOST form) --  "MOST" Form in Place? --      Prognosis: Poor overall  Care plan was discussed with RN and CCM  Thank you for allowing the Palliative Medicine Team to assist in the care of this patient.   Total Time 35 min Prolonged Time Billed  no  Greater than 50%  of this time was spent counseling and coordinating care related to the above assessment and plan.  Asencion Gowda, NP  Please contact Palliative Medicine Team phone at [phone] for questions and concerns.

## 2020-09-30 NOTE — Progress Notes (Signed)
Progress Note  Patient Name: Kristin [name] Date of Encounter: 09/30/2020  Primary Cardiologist: Rockey Situ  Subjective   No chest pain. SOB improving. Documented UOP 2.6 L for the past 24 hours with a net - 11.1 L for the admission. Renal function continues to improve with diuresis.   Inpatient Medications    Scheduled Meds: . amiodarone  400 mg Oral BID  . apixaban  5 mg Oral BID  . atorvastatin  80 mg Oral Daily  . budesonide (PULMICORT) nebulizer solution  0.5 mg Nebulization BID  . chlorhexidine  15 mL Mouth Rinse BID  . Chlorhexidine Gluconate Cloth  6 each Topical Daily  . docusate sodium  100 mg Oral BID  . famotidine  20 mg Oral Daily  . feeding supplement  237 mL Oral TID BM  . furosemide  40 mg Intravenous BID  . insulin aspart  0-20 Units Subcutaneous TID WC  . insulin aspart  0-5 Units Subcutaneous QHS  . ipratropium-albuterol  3 mL Nebulization Q4H  . levothyroxine  75 mcg Oral Q0600  . mouth rinse  15 mL Mouth Rinse q12n4p  . multivitamin with minerals  1 tablet Oral Daily  . polyethylene glycol  17 g Oral Daily  . predniSONE  50 mg Oral Q breakfast  . sodium chloride flush  3 mL Intravenous Q12H  . tiotropium  18 mcg Inhalation Daily   Continuous Infusions: . sodium chloride 5 mL/hr at 09/30/20 0600  . ampicillin-sulbactam (UNASYN) IV Stopped (09/30/20 0307)   PRN Meds: sodium chloride, acetaminophen, albuterol, ALPRAZolam, ondansetron (ZOFRAN) IV, prochlorperazine, sodium chloride flush   Vital Signs    Vitals:   09/30/20 0430 09/30/20 0500 09/30/20 0530 09/30/20 0754  BP:      Pulse: 65 (!) 59 61   Resp: (!) 26 16 16    Temp:      TempSrc:      SpO2: 100% 100% 100% 100%  Weight:  112.1 kg    Height:        Intake/Output Summary (Last 24 hours) at 09/30/2020 0848 Last data filed at 09/30/2020 0600 Gross per 24 hour  Intake 1237.62 ml  Output 3940 ml  Net -2702.38 ml   Filed Weights   09/28/20 0500 09/29/20 0428 09/30/20 0500  Weight:  113.1 kg 111.8 kg 112.1 kg    Telemetry    SR with PACs and PVCs - Personally Reviewed  ECG    No new tracings - Personally Reviewed  Physical Exam   GEN: No acute distress.   Neck: JVD is difficult to assess secondary to body habitus. Cardiac: RRR, II/VI systolic murmur RUSB, no rubs, or gallops.  Respiratory: Clear to auscultation bilaterally.  GI: Soft, nontender, non-distended.   MS: No edema; No deformity. Neuro:  Alert and oriented x 3; Nonfocal.  Psych: Normal affect.  Labs    Chemistry Recent Labs  Lab 09/25/20 2158 09/26/20 0150 09/27/20 0313 09/28/20 0416 09/29/20 0339 09/30/20 0519  NA 126*   < > 131* 138 139 139  K 5.9*   < > 5.4* 5.1 5.5* 4.9  CL 86*   < > 88* 90* 89* 87*  CO2 31   < > 33* 37* 42* 43*  GLUCOSE 155*   < > 181* 137* 148* 160*  BUN 58*   < > 56* 54* 54* 48*  CREATININE 2.16*   < > 1.69* 1.38* 1.14* 1.09*  CALCIUM 8.0*   < > 8.8* 8.9 9.3 9.2  PROT 6.9  --  7.3  --   --   --   ALBUMIN 3.0*  --  3.1*  --   --   --   AST 32  --  27  --   --   --   ALT 24  --  24  --   --   --   ALKPHOS 42  --  44  --   --   --   BILITOT 0.5  --  0.6  --   --   --   GFRNONAA 24*   < > 33* 42* 52* 55*  ANIONGAP 9   < > 10 11 8 9    < > = values in this interval not displayed.     Hematology Recent Labs  Lab 09/28/20 0416 09/29/20 0339 09/30/20 0519  WBC 7.4 9.3 8.6  RBC 3.59* 3.81* 3.89  HGB 10.4* 11.1* 11.5*  HCT 34.0* 35.5* 36.1  MCV 94.7 93.2 92.8  MCH 29.0 29.1 29.6  MCHC 30.6 31.3 31.9  RDW 14.4 14.5 14.3  PLT 332 375 338    Cardiac EnzymesNo results for input(s): TROPONINI in the last 168 hours. No results for input(s): TROPIPOC in the last 168 hours.   BNP Recent Labs  Lab 09/25/20 2157 09/27/20 0313  BNP 1,972.2* 1,737.3*     DDimer No results for input(s): DDIMER in the last 168 hours.   Radiology    DG Chest 1 View  Result Date: 09/27/2020 IMPRESSION: Cardiomegaly with increased bilateral peribronchial and interstitial  thickening, which may be related to increasing pulmonary edema and/or reported aspiration. Electronically Signed   By: Dahlia Bailiff MD   On: 09/27/2020 03:03   DG Abd 1 View  Result Date: 09/27/2020 IMPRESSION: Gaseous distension of the stomach. No pathologically dilated loops of small or large bowel visualized. Electronically Signed   By: Dahlia Bailiff MD   On: 09/27/2020 03:06   CT HEAD WO CONTRAST  Result Date: 09/26/2020 IMPRESSION: 1. No acute intracranial pathology. 2. Chronic microvascular ischemic changes. Electronically Signed   By: Anner Crete M.D.   On: 09/26/2020 03:06   US Venous Img Lower Bilateral (DVT)  Result Date: 09/26/2020 IMPRESSION: No evidence of deep venous thrombosis in either lower extremity. Electronically Signed   By: Iven Finn M.D.   On: 09/26/2020 06:39   DG Chest Portable 1 View  Result Date: 09/25/2020 IMPRESSION: Cardiomegaly with equivocal progression. Increasing peribronchial and interstitial thickening may be pulmonary edema or infection. Overall findings suggest CHF. Electronically Signed   By: Keith Rake M.D.   On: 09/25/2020 22:19   Cardiac Studies   Cardiac Catheterization 11.2021  Left Main  Vessel is angiographically normal.  Left Anterior Descending  Mid LAD lesion is 20% stenosed. The lesion is mildly calcified.  Left Circumflex  Prox Cx lesion is 60% stenosed. The lesion is moderately calcified.  Mid Cx to Dist Cx lesion is 20% stenosed.  Second Obtuse Marginal Branch  Vessel is angiographically normal.  Third Obtuse Marginal Branch  Vessel is angiographically normal.  Right Coronary Artery  Prox RCA lesion is 30% stenosed. The lesion is mildly calcified.  Mid RCA lesion is 40% stenosed. The lesion is mildly calcified.  Right Posterior Descending Artery  RPDA lesion is 30% stenosed.   _____________  2D Echocardiogram 5.13.2022  1. Left ventricular ejection fraction, by estimation, is >55%. The  left  ventricle has normal function. The left ventricle has no regional wall  motion abnormalities. There is mild left ventricular  hypertrophy. Left  ventricular diastolic function could  not be evaluated.  2. Right ventricular systolic function is normal. The right ventricular  size is mildly enlarged.  3. The mitral valve was not well visualized. Unable to accurately assess  mitral valve regurgitation. Moderate mitral annular calcification.  4. The aortic valve was not well visualized. Aortic valve regurgitation  not well assessed. Aortic valve gradient unable to be assessed.  _____________   Patient Profile     70 y.o. 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  Assessment & Plan    1. Acute on chronic HFpEF: -Volume status is difficult to assess due to body habitus  -Renal function continues to improve with IV diuresis  -Continue IV Lasix 40 mg bid for at least another 24 hours or until there is evidence of intravascular volume depletion  -Strict I/O -Daily weights   2. PAF/flutter: -Maintaining sinus rhythm -Avoiding AV nodal blocking medications with history post-termination pauses -She remains on amiodarone  -CHADS2VASc 5 (CHF, HTN, age x 1, DM, sex category) -Eliquis  3. Bradycardia/pauses: -No significant pauses or bradycardic events over the past 24 hours -Avoiding AV nodal blocking medications -Remains on amiodarone to limit Afib post-termination pauses -No indication for emergent PPM at this time  4. Nonobstructive CAD with demand ischemia: -Minimally elevated  high-sensitivity troponin of 19 in the setting of significant volume overload.  Never with chest pain. -Recent nonobstructive disease on cath in 03/2020 as outlined below -Continue medical management -No plans for inpatient ischemic evaluation at this time -Continue Eliquis in place of aspirin  5. Aortic stenosis: -Aortic valve not well visualized by echo this admission with prior notation of moderate to severe aortic stenosis -Outpatient follow-up  6. HTN: -Blood pressure stable -Continue current therapy  7. Hyperkalemia: -Improved  8. HLD: -PTA statin   9. SCC of RLL: -Followed by oncology  -Seen by palliative care  10. COPD: -Per IM  For questions or updates, please contact [facility] Please consult [email or web address] for contact info under Cardiology/STEMI.    Signed, Christell Faith, PA-C [place] Pager: [phone] 09/30/2020, 8:48 AM

## 2020-09-30 NOTE — Progress Notes (Signed)
PROGRESS NOTE    Kristin [name]  OJJ:[id number] DOB: [birth year] DOA: 09/25/2020 PCP: [place]  Brief Narrative: Kristin [name] is a chronically ill morbidly obese female with history of nonobstructive CAD, severe aortic stenosis being evaluated for TAVR, chronic diastolic CHF, advanced COPD/chronic respiratory failure on 3 L home O2, history of atrial flutter on amiodarone and apixaban, OSA not on CPAP, hypothyroidism, morbid obesity, right lower lobe lung cancer status post XRT, diabetes, right humeral fracture (a week prior to admission ) presented to the ED on 5/12 with respiratory distress, she was noted to be tachypneic and treated with BiPAP briefly,  she was admitted to the ICU, chest x-ray was concerning for pulmonary vascular congestion, COVID and flu PCR were negative, she was treated with IV Lasix duo nebs and steroids also  received a dose of Narcan. -Transferred to [facility] service -While in the ICU she vomited had worsening hypoxia x-ray was concerning for aspiration and was started on Unasyn -Improving clinically subsequently  Assessment & Plan:   Acute on chronic hypoxic and hypercarbic respiratory failure Acute on chronic diastolic CHF, severe Aortic stenosis COPD exacerbation/end-stage COPD OSA/OHS Aspiration pneumonia -Multifactorial respiratory failure likely worsened by recent opiate use -Treated with BiPAP on admission and then weaned down to 4 L, overnight-5/14, vomited and had worsening hypoxia concerning for aspiration, started on Unasyn and high flow nasal cannula, continue IV Unasyn, day 4/5 today -Diuresing well, remains on IV Lasix she is - 11 L -Cut down IV steroids and transition to prednisone taper -Palliative care following as well, now DNR, high risk of decompensation and quick readmission  Acute diastolic CHF Severe aortic stenosis -Continue IV Lasix today, can likely switch to p.o. tomorrow, volume status considerably better-Being evaluated as  outpatient for TAVR work-up which has been deferred  Paroxysmal atrial flutter/A. Fib with RVR and bradycardia -Was bradycardic on admission requiring atropine -Coreg and amiodarone were held initially -Amiodarone resumed, continue Eliquis  Acute metabolic encephalopathy -Multifactorial, chronically hypercarbic from advanced COPD and untreated OSA/OHS -off BiPAP due to vomiting/aspiration event overnight 5/14 -Improved -DNR, discontinued fentanyl patch  AKI on CKD 3 -Hyperkalemia -Likely cardiorenal, improving with diuresis  Closed fracture of proximal end of right humerus 4/29 -keeps arm immobilized but refuses sling -Tylenol as needed  Type 2 diabetes mellitus -CBGs are stable, on sliding scale insulin only for now  Non-small cell lung cancer Heavy tobacco abuse -Recently diagnosed, treated with XRT in 3/22  Obesity -BMI -45.6  Hypothyroidism -Continue Synthroid  DVT prophylaxis: Eliquis Code Status: DNR Family Communication: No family at bedside, called and updated daughter [name] yesterday,  discussed poor prognosis Disposition Plan:  Status is: Inpatient  Remains inpatient appropriate because:Inpatient level of care appropriate due to severity of illness   Dispo: The patient is from: Home              Anticipated d/c is to: SNF              Patient currently is not medically stable to d/c.   Difficult to place patient No   Consultants:   PCCM  Cardiology   Procedures: BiPAP  Antimicrobials:    Subjective: -Feels better, breathing is improving, down to 2 to 3 L O2  Objective: Vitals:   09/30/20 0754 09/30/20 1139 09/30/20 1200 09/30/20 1559  BP:   (!) 161/67   Pulse:   82   Resp:   (!) 24   Temp:      TempSrc:  SpO2: 100% 100% 96% 96%  Weight:      Height:        Intake/Output Summary (Last 24 hours) at 09/30/2020 1618 Last data filed at 09/30/2020 1419 Gross per 24 hour  Intake 1734.76 ml  Output 2450 ml  Net -715.24 ml   Filed  Weights   09/28/20 0500 09/29/20 0428 09/30/20 0500  Weight: 113.1 kg 111.8 kg 112.1 kg    Examination:  General exam: Chronically ill morbidly obese female sitting up in bed, awake alert oriented x3, back on nasal cannula this morning CVS: S1-S2, regular rhythm today, systolic murmur Lungs: Poor air movement bilaterally, otherwise clear Abdomen: Soft, obese, nontender, bowel sounds present Extremities: Right arm immobilized, no edema  Skin: No rash on exposed skin Psych: Flat affect   Data Reviewed:   CBC: Recent Labs  Lab 09/25/20 2158 09/26/20 0425 09/27/20 0313 09/28/20 0416 09/29/20 0339 09/30/20 0519  WBC 13.8* 10.5 13.4* 7.4 9.3 8.6  NEUTROABS 12.2*  --  12.8* 7.0 8.6* 7.7  HGB 10.8* 10.5* 11.4* 10.4* 11.1* 11.5*  HCT 34.0* 31.9* 36.5 34.0* 35.5* 36.1  MCV 92.4 91.9 93.4 94.7 93.2 92.8  PLT 352 311 364 332 375 417   Basic Metabolic Panel: Recent Labs  Lab 09/26/20 0150 09/26/20 0425 09/27/20 0313 09/28/20 0416 09/29/20 0339 09/30/20 0519  NA 126* 128* 131* 138 139 139  K 5.8* 5.1 5.4* 5.1 5.5* 4.9  CL 87* 89* 88* 90* 89* 87*  CO2 29 30 33* 37* 42* 43*  GLUCOSE 169* 195* 181* 137* 148* 160*  BUN 57* 56* 56* 54* 54* 48*  CREATININE 2.05* 1.98* 1.69* 1.38* 1.14* 1.09*  CALCIUM 7.9* 7.9* 8.8* 8.9 9.3 9.2  MG 1.9  --  2.1 1.9  --   --   PHOS  --   --  5.2* 4.1  --   --    GFR: Estimated Creatinine Clearance: 58.4 mL/min (A) (by C-G formula based on SCr of 1.09 mg/dL (H)). Liver Function Tests: Recent Labs  Lab 09/25/20 2158 09/27/20 0313  AST 32 27  ALT 24 24  ALKPHOS 42 44  BILITOT 0.5 0.6  PROT 6.9 7.3  ALBUMIN 3.0* 3.1*   No results for input(s): LIPASE, AMYLASE in the last 168 hours. No results for input(s): AMMONIA in the last 168 hours. Coagulation Profile: No results for input(s): INR, PROTIME in the last 168 hours. Cardiac Enzymes: No results for input(s): CKTOTAL, CKMB, CKMBINDEX, TROPONINI in the last 168 hours. BNP (last 3  results) No results for input(s): PROBNP in the last 8760 hours. HbA1C: No results for input(s): HGBA1C in the last 72 hours. CBG: Recent Labs  Lab 09/29/20 1544 09/29/20 1820 09/29/20 2122 09/30/20 0731 09/30/20 1121  GLUCAP 184* 192* 142* 187* 91   Lipid Profile: No results for input(s): CHOL, HDL, LDLCALC, TRIG, CHOLHDL, LDLDIRECT in the last 72 hours. Thyroid Function Tests: No results for input(s): TSH, T4TOTAL, FREET4, T3FREE, THYROIDAB in the last 72 hours. Anemia Panel: No results for input(s): VITAMINB12, FOLATE, FERRITIN, TIBC, IRON, RETICCTPCT in the last 72 hours. Urine analysis:    Component Value Date/Time   COLORURINE YELLOW (A) 05/26/2020 1001   APPEARANCEUR CLEAR (A) 05/26/2020 1001   LABSPEC 1.017 05/26/2020 1001   PHURINE 5.0 05/26/2020 1001   GLUCOSEU NEGATIVE 05/26/2020 1001   HGBUR NEGATIVE 05/26/2020 1001   BILIRUBINUR NEGATIVE 05/26/2020 1001   [place] 05/26/2020 1001   PROTEINUR 30 (A) 05/26/2020 1001   NITRITE NEGATIVE 05/26/2020 1001  LEUKOCYTESUR NEGATIVE 05/26/2020 1001   Sepsis Labs: @LABRCNTIP (procalcitonin:4,lacticidven:4)  ) Recent Results (from the past 240 hour(s))  Resp Panel by RT-PCR (Flu A&B, Covid) Nasopharyngeal Swab     Status: None   Collection Time: 09/25/20  9:57 PM   Specimen: Nasopharyngeal Swab; Nasopharyngeal(NP) swabs in vial transport medium  Result Value Ref Range Status   SARS Coronavirus 2 by RT PCR NEGATIVE NEGATIVE Final    Comment: (NOTE) SARS-CoV-2 target nucleic acids are NOT DETECTED.  The SARS-CoV-2 RNA is generally detectable in upper respiratory specimens during the acute phase of infection. The lowest concentration of SARS-CoV-2 viral copies this assay can detect is 138 copies/mL. A negative result does not preclude SARS-Cov-2 infection and should not be used as the sole basis for treatment or other patient management decisions. A negative result may occur with  improper specimen  collection/handling, submission of specimen other than nasopharyngeal swab, presence of viral mutation(s) within the areas targeted by this assay, and inadequate number of viral copies(<138 copies/mL). A negative result must be combined with clinical observations, patient history, and epidemiological information. The expected result is Negative.  Fact Sheet for Patients:  EntrepreneurPulse.com.au  Fact Sheet for Healthcare Providers:  IncredibleEmployment.be  This test is no t yet approved or cleared by the [place] FDA and  has been authorized for detection and/or diagnosis of SARS-CoV-2 by FDA under an Emergency Use Authorization (EUA). This EUA will remain  in effect (meaning this test can be used) for the duration of the COVID-19 declaration under Section 564(b)(1) of the Act, 21 U.S.C.section 360bbb-3(b)(1), unless the authorization is terminated  or revoked sooner.       Influenza A by PCR NEGATIVE NEGATIVE Final   Influenza B by PCR NEGATIVE NEGATIVE Final    Comment: (NOTE) The Xpert Xpress SARS-CoV-2/FLU/RSV plus assay is intended as an aid in the diagnosis of influenza from Nasopharyngeal swab specimens and should not be used as a sole basis for treatment. Nasal washings and aspirates are unacceptable for Xpert Xpress SARS-CoV-2/FLU/RSV testing.  Fact Sheet for Patients: EntrepreneurPulse.com.au  Fact Sheet for Healthcare Providers: IncredibleEmployment.be  This test is not yet approved or cleared by the [place] FDA and has been authorized for detection and/or diagnosis of SARS-CoV-2 by FDA under an Emergency Use Authorization (EUA). This EUA will remain in effect (meaning this test can be used) for the duration of the COVID-19 declaration under Section 564(b)(1) of the Act, 21 U.S.C. section 360bbb-3(b)(1), unless the authorization is terminated or revoked.  Performed at [facility], [address] [place], [place] [address]   MRSA PCR Screening     Status: None   Collection Time: 09/26/20  3:40 AM   Specimen: Nasopharyngeal  Result Value Ref Range Status   MRSA by PCR NEGATIVE NEGATIVE Final    Comment:        The GeneXpert MRSA Assay (FDA approved for NASAL specimens only), is one component of a comprehensive MRSA colonization surveillance program. It is not intended to diagnose MRSA infection nor to guide or monitor treatment for MRSA infections. Performed at [facility], [address] [place], [place] [address]      Scheduled Meds: . amiodarone  400 mg Oral BID  . apixaban  5 mg Oral BID  . atorvastatin  80 mg Oral Daily  . budesonide (PULMICORT) nebulizer solution  0.5 mg Nebulization BID  . chlorhexidine  15 mL Mouth Rinse BID  . Chlorhexidine Gluconate Cloth  6 each Topical Daily  .  docusate sodium  100 mg Oral BID  . famotidine  20 mg Oral Daily  . feeding supplement  237 mL Oral TID BM  . furosemide  40 mg Intravenous BID  . insulin aspart  0-20 Units Subcutaneous TID WC  . insulin aspart  0-5 Units Subcutaneous QHS  . ipratropium-albuterol  3 mL Nebulization Q4H  . levothyroxine  75 mcg Oral Q0600  . mouth rinse  15 mL Mouth Rinse q12n4p  . multivitamin with minerals  1 tablet Oral Daily  . polyethylene glycol  17 g Oral Daily  . predniSONE  50 mg Oral Q breakfast  . sodium chloride flush  3 mL Intravenous Q12H  . tiotropium  18 mcg Inhalation Daily   Continuous Infusions: . sodium chloride 5 mL/hr at 09/30/20 0600  . ampicillin-sulbactam (UNASYN) IV 3 g (09/30/20 1440)     LOS: 5 days    Time spent: 16min  Domenic Polite, MD [facility]  09/30/2020, 4:18 PM

## 2020-09-30 NOTE — Progress Notes (Signed)
Occupational Therapy Treatment Patient Details Name: Kristin [name] MRN: [id number] DOB: 1950/12/31 Today's Date: 09/30/2020    History of present illness Pt is a 70 y/o F with PMH: nonobstructive CAD, severe aortic stenosis being evaluated for TAVR, chronic diastolic CHF, advanced COPD/chronic respiratory failure on 3 L home O2, history of atrial flutter on amiodarone and apixaban, OSA not on CPAP, hypothyroidism, morbid obesity, right lower lobe lung cancer status post XRT, diabetes, and recent right humeral fracture (to be immob w/ sling). Pt presented to the ED on 5/12 with respiratory distress, she was noted to be tachypneic and treated with BiPAP. Pt adm to ICU for monitoring, currently on [place].   OT comments  Upon entering the room, pt supine in bed eating finger foods with L hand. Pt with sling partially donned while in bed. Pt's daughter reported pt went to ortho consult prior to hospital admission and to continue conservative treatment with sling donned at all times and NWB. Pt was agreeable to OT intervention this session. Pt washes face with set up A to obtain needed items. Pt performed bed mobility with mod A to exit L side of bed. Pt incorrectly fastening sling and declined OT education and reports she adjusts it for her comfort. It was at a 90 degree angle although unsure if it actually was supportive. Pt standing x 5 reps from EOB with min A and min cuing for controlled sit/hand placement. Pt taking several steps towards HOB with min guard HHA. Pt returning to supine with mod A for safety. All needs within reach and R UE supported with positioning of pillows as well.    Follow Up Recommendations  SNF;Supervision - Intermittent    Equipment Recommendations  Tub/shower seat;3 in 1 bedside commode;Other (comment) (RW)       Precautions / Restrictions Precautions Precautions: Fall Required Braces or Orthoses: Sling Restrictions Weight Bearing Restrictions: Yes RUE Weight Bearing:  Non weight bearing       Mobility Bed Mobility Overal bed mobility: Modified Independent Bed Mobility: Supine to Sit;Sit to Supine     Supine to sit: Mod assist Sit to supine: Max assist   General bed mobility comments: Pt exiting the L side of the bed and maintains NWB    Transfers Overall transfer level: Needs assistance Equipment used: 1 person hand held assist Transfers: Sit to/from Stand Sit to Stand: Min assist         General transfer comment: min A x 5 reps    Balance Overall balance assessment: Needs assistance Sitting-balance support: No upper extremity supported;Feet unsupported Sitting balance-Leahy Scale: Good     Standing balance support: Single extremity supported Standing balance-Leahy Scale: Fair                             ADL either performed or assessed with clinical judgement        Vision Patient Visual Report: No change from baseline            Cognition Arousal/Alertness: Awake/alert Behavior During Therapy: WFL for tasks assessed/performed Overall Cognitive Status: Within Functional Limits for tasks assessed                                                     Pertinent Vitals/ Pain  Pain Assessment: Faces Faces Pain Scale: No hurt         Frequency  Min 2X/week        Progress Toward Goals  OT Goals(current goals can now be found in the care plan section)  Progress towards OT goals: Progressing toward goals  Acute Rehab OT Goals Patient Stated Goal: to get out of ICU OT Goal Formulation: With patient Time For Goal Achievement: 10/11/20 Potential to Achieve Goals: Good  Plan Discharge plan remains appropriate;Frequency remains appropriate       AM-PAC OT "6 Clicks" Daily Activity     Outcome Measure   Help from another person eating meals?: A Little Help from another person taking care of personal grooming?: A Little Help from another person toileting, which includes  using toliet, bedpan, or urinal?: A Lot Help from another person bathing (including washing, rinsing, drying)?: A Lot Help from another person to put on and taking off regular upper body clothing?: A Lot Help from another person to put on and taking off regular lower body clothing?: Total 6 Click Score: 13    End of Session Equipment Utilized During Treatment: Oxygen  OT Visit Diagnosis: Unsteadiness on feet (R26.81);Muscle weakness (generalized) (M62.81);History of falling (Z91.81);Pain Pain - Right/Left: Right Pain - part of body: Arm   Activity Tolerance Patient limited by pain   Patient Left in bed;with call bell/phone within reach;with bed alarm set   Nurse Communication Mobility status        Time: 0350-0938 OT Time Calculation (min): 23 min  Charges: OT General Charges $OT Visit: 1 Visit OT Treatments $Self Care/Home Management : 8-22 mins $Therapeutic Activity: 8-22 mins  Darleen Crocker, MS, OTR/L , CBIS ascom [phone]  09/30/20, 4:14 PM

## 2020-09-30 NOTE — Progress Notes (Signed)
Patient assisted back to bed by daughter at bedside. No acute events. Patient resting comfortably. Will continue to monitor.

## 2020-09-30 NOTE — Plan of Care (Signed)
  Problem: Clinical Measurements: Goal: Ability to maintain clinical measurements within normal limits will improve Outcome: Progressing Goal: Will remain free from infection Outcome: Progressing Goal: Diagnostic test results will improve Outcome: Progressing Goal: Respiratory complications will improve Outcome: Progressing Goal: Cardiovascular complication will be avoided Outcome: Progressing   Problem: Activity: Goal: Risk for activity intolerance will decrease Outcome: Progressing   Problem: Nutrition: Goal: Adequate nutrition will be maintained Outcome: Progressing   Problem: Elimination: Goal: Will not experience complications related to bowel motility Outcome: Progressing Goal: Will not experience complications related to urinary retention Outcome: Progressing

## 2020-09-30 NOTE — Telephone Encounter (Signed)
I spoke with the pt's daughter, [name], and the structural heart team is aware of the pt's admission.  The pt has a pending echo appointment and office visit with Dr Angelena Form in June.  Hospital records reviewed and echo completed but was non-diagnostic in evaluation of AS. I advised that the pt should keep echo scheduled in June. [name] agreed with plan at this time.

## 2020-10-01 DIAGNOSIS — I350 Nonrheumatic aortic (valve) stenosis: Secondary | ICD-10-CM

## 2020-10-01 DIAGNOSIS — J441 Chronic obstructive pulmonary disease with (acute) exacerbation: Secondary | ICD-10-CM | POA: Diagnosis not present

## 2020-10-01 DIAGNOSIS — J9621 Acute and chronic respiratory failure with hypoxia: Secondary | ICD-10-CM | POA: Diagnosis not present

## 2020-10-01 DIAGNOSIS — I480 Paroxysmal atrial fibrillation: Secondary | ICD-10-CM | POA: Diagnosis not present

## 2020-10-01 DIAGNOSIS — I5033 Acute on chronic diastolic (congestive) heart failure: Secondary | ICD-10-CM | POA: Diagnosis not present

## 2020-10-01 DIAGNOSIS — T17908A Unspecified foreign body in respiratory tract, part unspecified causing other injury, initial encounter: Secondary | ICD-10-CM | POA: Diagnosis not present

## 2020-10-01 LAB — CBC WITH DIFFERENTIAL/PLATELET
Abs Immature Granulocytes: 0.08 K/uL — ABNORMAL HIGH (ref 0.00–0.07)
Basophils Absolute: 0 K/uL (ref 0.0–0.1)
Basophils Relative: 0 %
Eosinophils Absolute: 0.3 K/uL (ref 0.0–0.5)
Eosinophils Relative: 2 %
HCT: 37 % (ref 36.0–46.0)
Hemoglobin: 11.8 g/dL — ABNORMAL LOW (ref 12.0–15.0)
Immature Granulocytes: 1 %
Lymphocytes Relative: 10 %
Lymphs Abs: 1.1 K/uL (ref 0.7–4.0)
MCH: 29.3 pg (ref 26.0–34.0)
MCHC: 31.9 g/dL (ref 30.0–36.0)
MCV: 91.8 fL (ref 80.0–100.0)
Monocytes Absolute: 0.9 K/uL (ref 0.1–1.0)
Monocytes Relative: 8 %
Neutro Abs: 8.6 K/uL — ABNORMAL HIGH (ref 1.7–7.7)
Neutrophils Relative %: 79 %
Platelets: 332 K/uL (ref 150–400)
RBC: 4.03 MIL/uL (ref 3.87–5.11)
RDW: 14.6 % (ref 11.5–15.5)
WBC: 10.9 K/uL — ABNORMAL HIGH (ref 4.0–10.5)
nRBC: 0 % (ref 0.0–0.2)

## 2020-10-01 LAB — BASIC METABOLIC PANEL
Anion gap: 10 (ref 5–15)
BUN: 45 mg/dL — ABNORMAL HIGH (ref 8–23)
CO2: 41 mmol/L — ABNORMAL HIGH (ref 22–32)
Calcium: 9 mg/dL (ref 8.9–10.3)
Chloride: 88 mmol/L — ABNORMAL LOW (ref 98–111)
Creatinine, Ser: 0.97 mg/dL (ref 0.44–1.00)
GFR, Estimated: >60 mL/min (ref >60)
Glucose, Bld: 93 mg/dL (ref 70–99)
Potassium: 3.9 mmol/L (ref 3.5–5.1)
Sodium: 139 mmol/L (ref 135–145)

## 2020-10-01 LAB — GLUCOSE, CAPILLARY
Glucose-Capillary: 86 mg/dL (ref 70–99)
Glucose-Capillary: 116 mg/dL — ABNORMAL HIGH (ref 70–99)
Glucose-Capillary: 312 mg/dL — ABNORMAL HIGH (ref 70–99)
Glucose-Capillary: 126 mg/dL — ABNORMAL HIGH (ref 70–99)

## 2020-10-01 MED ORDER — NICOTINE 21 MG/24HR TD PT24
21.0000 mg | MEDICATED_PATCH | Freq: Every day | TRANSDERMAL | Status: DC
  Administered 2020-10-01 – 2020-10-03 (×3): 21 mg via TRANSDERMAL
  Filled 2020-10-01 (×3): qty 1

## 2020-10-01 MED ORDER — GABAPENTIN 600 MG PO TABS
600.0000 mg | ORAL_TABLET | Freq: Two times a day (BID) | ORAL | Status: DC
  Administered 2020-10-01 – 2020-10-03 (×5): 600 mg via ORAL
  Filled 2020-10-01 (×6): qty 1

## 2020-10-01 MED ORDER — FENOFIBRATE 160 MG PO TABS
160.0000 mg | ORAL_TABLET | Freq: Every day | ORAL | Status: DC
  Administered 2020-10-01 – 2020-10-03 (×3): 160 mg via ORAL
  Filled 2020-10-01 (×3): qty 1

## 2020-10-01 MED ORDER — PREDNISONE 20 MG PO TABS
40.0000 mg | ORAL_TABLET | Freq: Every day | ORAL | Status: DC
  Administered 2020-10-02 – 2020-10-03 (×2): 40 mg via ORAL
  Filled 2020-10-01: qty 2
  Filled 2020-10-01: qty 4

## 2020-10-01 MED ORDER — GUAIFENESIN-DM 100-10 MG/5ML PO SYRP
5.0000 mL | ORAL_SOLUTION | ORAL | Status: DC | PRN
  Administered 2020-10-01: 5 mL via ORAL
  Filled 2020-10-01: qty 5

## 2020-10-01 MED ORDER — FERROUS SULFATE 325 (65 FE) MG PO TABS
325.0000 mg | ORAL_TABLET | Freq: Every day | ORAL | Status: DC
  Administered 2020-10-01 – 2020-10-03 (×3): 325 mg via ORAL
  Filled 2020-10-01 (×3): qty 1

## 2020-10-01 MED ORDER — CITALOPRAM HYDROBROMIDE 20 MG PO TABS
40.0000 mg | ORAL_TABLET | Freq: Every day | ORAL | Status: DC
  Administered 2020-10-01 – 2020-10-03 (×3): 40 mg via ORAL
  Filled 2020-10-01 (×3): qty 2

## 2020-10-01 MED ORDER — AMOXICILLIN-POT CLAVULANATE 875-125 MG PO TABS
1.0000 | ORAL_TABLET | Freq: Two times a day (BID) | ORAL | Status: AC
  Administered 2020-10-01 – 2020-10-02 (×3): 1 via ORAL
  Filled 2020-10-01 (×3): qty 1

## 2020-10-01 MED ORDER — EZETIMIBE 10 MG PO TABS
10.0000 mg | ORAL_TABLET | Freq: Every day | ORAL | Status: DC
  Administered 2020-10-01 – 2020-10-03 (×3): 10 mg via ORAL
  Filled 2020-10-01 (×4): qty 1

## 2020-10-01 MED ORDER — AMOXICILLIN-POT CLAVULANATE 875-125 MG PO TABS: 1.0000 | ORAL_TABLET | Freq: Two times a day (BID) | ORAL | Status: DC

## 2020-10-01 NOTE — Progress Notes (Addendum)
[facility] NAME: Kristin [name]    MR#:  [id number]  DATE OF BIRTH:  Feb 18, 1951  SUBJECTIVE:   Patient feels a lot better. Had some diarrhea last night. Her stool softener on hold. Did work little bit with physical therapy this morning. Eating well. REVIEW OF SYSTEMS:   Review of Systems  Constitutional: Negative for chills, fever and weight loss.  HENT: Negative for ear discharge, ear pain and nosebleeds.   Eyes: Negative for blurred vision, pain and discharge.  Respiratory: Negative for sputum production, shortness of breath, wheezing and stridor.   Cardiovascular: Negative for chest pain, palpitations, orthopnea and PND.  Gastrointestinal: Negative for abdominal pain, diarrhea, nausea and vomiting.  Genitourinary: Negative for frequency and urgency.  Musculoskeletal: Positive for joint pain. Negative for back pain.  Neurological: Positive for weakness. Negative for sensory change, speech change and focal weakness.  Psychiatric/Behavioral: Negative for depression and hallucinations. The patient is not nervous/anxious.    Tolerating Diet:yes Tolerating PT: rehab  DRUG ALLERGIES:   Allergies  Allergen Reactions  . Altace [Ramipril] Swelling    VITALS:  Blood pressure 135/60, pulse 64, temperature (!) 97.1 F (36.2 C), temperature source Oral, resp. rate 17, height 5\' 2"  (1.575 m), weight 110.5 kg, SpO2 99 %.  PHYSICAL EXAMINATION:   Physical Exam  GENERAL:  70 y.o.-year-old patient lying in the bed with no acute distress. Obeses LUNGS: decreased breath sounds bilaterally, no wheezing, rales, rhonchi. No use of accessory muscles of respiration.  CARDIOVASCULAR: S1, S2 normal. No murmurs, rubs, or gallops.  ABDOMEN: Soft, nontender, nondistended. Bowel sounds present. No organomegaly or mass. Abdominal obesity EXTREMITIES: No cyanosis, clubbing or edema b/l.   Right UE sling+ NEUROLOGIC: Cranial nerves II through XII are  intact. No focal Motor or sensory deficits b/l.   PSYCHIATRIC:  patient is alert and oriented x 3.  SKIN: No obvious rash, lesion, or ulcer.   LABORATORY PANEL:  CBC Recent Labs  Lab 10/01/20 0611  WBC 10.9*  HGB 11.8*  HCT 37.0  PLT 332    Chemistries  Recent Labs  Lab 09/27/20 0313 09/28/20 0416 09/29/20 0339 10/01/20 0611  NA 131* 138   < > 139  K 5.4* 5.1   < > 3.9  CL 88* 90*   < > 88*  CO2 33* 37*   < > 41*  GLUCOSE 181* 137*   < > 93  BUN 56* 54*   < > 45*  CREATININE 1.69* 1.38*   < > 0.97  CALCIUM 8.8* 8.9   < > 9.0  MG 2.1 1.9  --   --   AST 27  --   --   --   ALT 24  --   --   --   ALKPHOS 44  --   --   --   BILITOT 0.6  --   --   --    < > = values in this interval not displayed.   Cardiac Enzymes No results for input(s): TROPONINI in the last 168 hours. RADIOLOGY:  No results found. ASSESSMENT AND PLAN:  Kristin [name] is a chronically ill morbidly obese female with history of nonobstructive CAD, severe aortic stenosis being evaluated for TAVR, chronic diastolic CHF, advanced COPD/chronic respiratory failure on 3 L home O2, history of atrial flutter on amiodarone and apixaban, OSA not on CPAP, hypothyroidism, morbid obesity, right lower lobe lung cancer status post XRT, diabetes, right humeral fracture (a  week prior to admission ) presented to the ED on 5/12 with respiratory distress, she was noted to be tachypneic and treated with BiPAP briefly,  she was admitted to the ICU, chest x-ray was concerning for pulmonary vascular congestion.  Acute on chronic hypoxic respiratory failure secondary to acute on chronic diastolic congestive heart failure with severe aortic stenosis End-stage COPD with exacerbation Severe sleep apnea Suspected aspiration pneumonia -- patient overall improving. She is down to 3 L nasal cannula oxygen. Patient wears oxygen chronically at home. -- Switch to oral Augmentin will treat for 5 days -- PO prednisone taper -- continue  bronchodilators and inhalers as needed -- IV Lasix 40 mg BID-- diuresis well. Will defer to cardiology for switching to oral Lasix  Paroxysmal atrial flutter/a fib with RVR with intermittent bradycardia -- Coreg on hold -- continue amiodarone. Continue eliquis  Severe AS --Dr Myrna Blazer is following as out pt  Acute metabolic encephalopathy -- multifactorial from above reasons -- resolved  AK I on CKD III -- creatinine improving.  Type II diabetes with CKD -- sugars stable continue sliding scale insulin for now  Non-small cell lung cancer with history of heavy tobacco abuse -- recently diagnosed and treated with radiation treatment in March 2022  Close fracture proximal end of right humerus 09/12/2020 with mechanical fall at home -- continue sling/immobilization and follow-up with orthopedic as per instruction  Hypothyroidism  --continue Synthroid  Morbid obesity with severe sleep apnea -- intermittent use of BiPAP -- continue oxygen  Procedures: Family communication :dter Kristin [name] on the phone Consults : palliative care CODE STATUS: DNR DVT Prophylaxis : eliquis Level of care: Progressive Cardiac Status is: Inpatient  Remains inpatient appropriate because:Inpatient level of care appropriate due to severity of illness   Dispo: The patient is from: Home              Anticipated d/c is to: SNF              Patient currently is not medically stable to d/c.   Difficult to place patient No        TOTAL TIME TAKING CARE OF THIS PATIENT: 30 minutes.  >50% time spent on counselling and coordination of care  Note: This dictation was prepared with Dragon dictation along with smaller phrase technology. Any transcriptional errors that result from this process are unintentional.  Kristin [name] M.D    [facility]   CC: [facility]; [facility] ID: Kristin [name], female   DOB: 19-Jul-1950, 70 y.o.   MRN: [id number]

## 2020-10-01 NOTE — Progress Notes (Signed)
Progress Note  Patient Name: Kristin [name] Date of Encounter: 10/01/2020  [facility] Cardiologist: Ida Rogue, MD   Subjective   Shortness of breath, edema much improved compared to admission.  States feeling very swollen prior to her fall about 1 week ago.  Edema and shortness of breath have been ongoing for about a week.  Right shoulder placed in a sling  Inpatient Medications    Scheduled Meds: . amiodarone  400 mg Oral BID  . amoxicillin-clavulanate  1 tablet Oral Q12H  . apixaban  5 mg Oral BID  . atorvastatin  80 mg Oral Daily  . budesonide (PULMICORT) nebulizer solution  0.5 mg Nebulization BID  . chlorhexidine  15 mL Mouth Rinse BID  . Chlorhexidine Gluconate Cloth  6 each Topical Daily  . citalopram  40 mg Oral Daily  . docusate sodium  100 mg Oral BID  . ezetimibe  10 mg Oral Daily  . famotidine  20 mg Oral Daily  . feeding supplement  237 mL Oral TID BM  . fenofibrate  160 mg Oral Daily  . ferrous sulfate  325 mg Oral Daily  . furosemide  40 mg Intravenous BID  . gabapentin  600 mg Oral BID  . insulin aspart  0-20 Units Subcutaneous TID WC  . insulin aspart  0-5 Units Subcutaneous QHS  . ipratropium-albuterol  3 mL Nebulization Q4H  . levothyroxine  75 mcg Oral Q0600  . mouth rinse  15 mL Mouth Rinse q12n4p  . multivitamin with minerals  1 tablet Oral Daily  . polyethylene glycol  17 g Oral Daily  . [START ON 10/02/2020] predniSONE  40 mg Oral Q breakfast  . sodium chloride flush  3 mL Intravenous Q12H  . tiotropium  18 mcg Inhalation Daily   Continuous Infusions: . sodium chloride 5 mL/hr at 10/01/20 0600  . ampicillin-sulbactam (UNASYN) IV 3 g (10/01/20 0902)   PRN Meds: sodium chloride, acetaminophen, albuterol, ALPRAZolam, ondansetron (ZOFRAN) IV, sodium chloride flush   Vital Signs    Vitals:   10/01/20 0600 10/01/20 0751 10/01/20 0900 10/01/20 1000  BP: (!) 144/67  (!) 137/49 135/60  Pulse: (!) 39  67 64  Resp: 17  (!) 24 17  Temp:    (!) 97.1 F (36.2 C)   TempSrc:   Oral   SpO2:  100% 96% 99%  Weight:      Height:        Intake/Output Summary (Last 24 hours) at 10/01/2020 1403 Last data filed at 10/01/2020 0954 Gross per 24 hour  Intake 1705.33 ml  Output 1650 ml  Net 55.33 ml   Last 3 Weights 10/01/2020 09/30/2020 09/29/2020  Weight (lbs) 243 lb 9.7 oz 247 lb 2.2 oz 246 lb 7.6 oz  Weight (kg) 110.5 kg 112.1 kg 111.8 kg      Telemetry    Sinus rhythm- Personally Reviewed  ECG    No new tracing- Personally Reviewed  Physical Exam   GEN: No acute distress.   Neck: No JVD Cardiac: RRR, 2/6 systolic murmur.  Respiratory:  Diminished breath sounds at bases GI: Soft, nontender, distended  MS: No edema; right arm in sling Neuro:  Nonfocal  Psych: Normal affect   Labs    High Sensitivity Troponin:   Recent Labs  Lab 09/25/20 2158 09/25/20 2357 09/27/20 0313 09/27/20 0502  TROPONINIHS 12 11 18* 19*      Chemistry Recent Labs  Lab 09/25/20 2158 09/26/20 0150 09/27/20 2458 09/28/20 0416 09/29/20 0998  09/30/20 0519 10/01/20 0611  NA 126*   < > 131*   < > 139 139 139  K 5.9*   < > 5.4*   < > 5.5* 4.9 3.9  CL 86*   < > 88*   < > 89* 87* 88*  CO2 31   < > 33*   < > 42* 43* 41*  GLUCOSE 155*   < > 181*   < > 148* 160* 93  BUN 58*   < > 56*   < > 54* 48* 45*  CREATININE 2.16*   < > 1.69*   < > 1.14* 1.09* 0.97  CALCIUM 8.0*   < > 8.8*   < > 9.3 9.2 9.0  PROT 6.9  --  7.3  --   --   --   --   ALBUMIN 3.0*  --  3.1*  --   --   --   --   AST 32  --  27  --   --   --   --   ALT 24  --  24  --   --   --   --   ALKPHOS 42  --  44  --   --   --   --   BILITOT 0.5  --  0.6  --   --   --   --   GFRNONAA 24*   < > 33*   < > 52* 55* >60  ANIONGAP 9   < > 10   < > 8 9 10    < > = values in this interval not displayed.     Hematology Recent Labs  Lab 09/29/20 0339 09/30/20 0519 10/01/20 0611  WBC 9.3 8.6 10.9*  RBC 3.81* 3.89 4.03  HGB 11.1* 11.5* 11.8*  HCT 35.5* 36.1 37.0  MCV 93.2 92.8  91.8  MCH 29.1 29.6 29.3  MCHC 31.3 31.9 31.9  RDW 14.5 14.3 14.6  PLT 375 338 332    BNP Recent Labs  Lab 09/25/20 2157 09/27/20 0313  BNP 1,972.2* 1,737.3*     DDimer No results for input(s): DDIMER in the last 168 hours.   Radiology    No results found.  Cardiac Studies   TTEcho 09/26/2020 1. Left ventricular ejection fraction, by estimation, is >55%. The left  ventricle has normal function. The left ventricle has no regional wall  motion abnormalities. There is mild left ventricular hypertrophy. Left  ventricular diastolic function could  not be evaluated.  2. Right ventricular systolic function is normal. The right ventricular  size is mildly enlarged.  3. The mitral valve was not well visualized. Unable to accurately assess  mitral valve regurgitation. Moderate mitral annular calcification.  4. The aortic valve was not well visualized. Aortic valve regurgitation  not well assessed. Aortic valve gradient unable to be assessed.   Patient Profile     70 y.o. female with history of HFpEF, lung cancer, paroxysmal atrial fibrillation, moderate to severe aortic stenosis presenting with volume overload and shortness of breath.  Diagnosed with possible aspiration pneumonia, being seen for HFpEF and paroxysmal A. fib.  Assessment & Plan    1.  HFpEF -Symptoms improved with IV Lasix -Creatinine normal -Continue IV Lasix at least 1 more day. -Patient will likely benefit from better p.o. diuretic.  Possibly torsemide 40 mg daily due for better absorption with to obesity -Okay to transfer patient out from icu  2.  Paroxysmal A. Fib, -Maintaining sinus rhythm -Continue po  amiodarone 400mg  bid, Eliquis  3.  Moderate to severe AS -Outpatient TAVR work-up  4.  Right shoulder displacement -Management as per orthopedics.  5.  Respiratory failure, hypoxia -Pulmonary medicine following    Signed, Kate Sable, MD  10/01/2020, 2:03 PM

## 2020-10-01 NOTE — Progress Notes (Signed)
Physical Therapy Treatment Patient Details Name: Kristin [name] MRN: [id number] DOB: [birth year] Today's Date: 10/01/2020    History of Present Illness Pt is a 70 y/o F with PMH: nonobstructive CAD, severe aortic stenosis being evaluated for TAVR, chronic diastolic CHF, advanced COPD/chronic respiratory failure on 3 L home O2, history of atrial flutter on amiodarone and apixaban, OSA not on CPAP, hypothyroidism, morbid obesity, right lower lobe lung cancer status post XRT, diabetes, and recent right humeral fracture (to be immob w/ sling). Pt presented to the ED on 5/12 with respiratory distress, she was noted to be tachypneic and treated with BiPAP. Pt adm to ICU for monitoring, currently on [place].    PT Comments    Pt was pleasant and willing to participate with below therex but declined mobility training secondary to extensive diarrhea during the night and this morning.  Pt put forth good effort with below therex with no adverse symptoms noted and with BP, HR, and SpO2 on 2.5LO2/min all WNL.  Pt will benefit from PT services in a SNF setting upon discharge to safely address deficits listed in patient problem list for decreased caregiver assistance and eventual return to PLOF.    Follow Up Recommendations  SNF     Equipment Recommendations  Other (comment) (TBD at next venue of care)    Recommendations for Other Services       Precautions / Restrictions Precautions Precautions: Fall Required Braces or Orthoses: Sling Restrictions Weight Bearing Restrictions: Yes RUE Weight Bearing: Non weight bearing    Mobility  Bed Mobility               General bed mobility comments: Pt declined mobility this session secondary to extensive recent diarrhea    Transfers                    Ambulation/Gait                 Stairs             Wheelchair Mobility    Modified Rankin (Stroke Patients Only)       Balance                                             Cognition Arousal/Alertness: Awake/alert Behavior During Therapy: WFL for tasks assessed/performed Overall Cognitive Status: Within Functional Limits for tasks assessed                                        Exercises Total Joint Exercises Ankle Circles/Pumps: AROM;Strengthening;Both;10 reps;5 reps (With manual resistance) Quad Sets: Strengthening;Both;5 reps;10 reps Gluteal Sets: Strengthening;Both;5 reps;10 reps Heel Slides: AROM;AAROM;Strengthening;Both;10 reps Hip ABduction/ADduction: AROM;AAROM;Strengthening;Both;10 reps Straight Leg Raises: AROM;AAROM;Strengthening;Both;10 reps Other Exercises Other Exercises: HEP education for BLE APs, QS, GS x 10 each every 1-2 hours daily; BLE hip abd/add x 10 each 2-3x/day    General Comments        Pertinent Vitals/Pain Pain Assessment: No/denies pain    Home Living                      Prior Function            PT Goals (current goals can now be found in the care plan section) Progress towards PT  goals: PT to reassess next treatment    Frequency    Min 2X/week      PT Plan Current plan remains appropriate    Co-evaluation              AM-PAC PT "6 Clicks" Mobility   Outcome Measure  Help needed turning from your back to your side while in a flat bed without using bedrails?: A Lot Help needed moving from lying on your back to sitting on the side of a flat bed without using bedrails?: A Lot Help needed moving to and from a bed to a chair (including a wheelchair)?: A Little Help needed standing up from a chair using your arms (e.g., wheelchair or bedside chair)?: A Little Help needed to walk in hospital room?: A Little Help needed climbing 3-5 steps with a railing? : A Lot 6 Click Score: 15    End of Session Equipment Utilized During Treatment: Oxygen Activity Tolerance: Patient tolerated treatment well;No increased pain Patient left: in bed;with call  bell/phone within reach Nurse Communication: Mobility status PT Visit Diagnosis: Unsteadiness on feet (R26.81);Muscle weakness (generalized) (M62.81);Difficulty in walking, not elsewhere classified (R26.2)     Time: 9390-3009 PT Time Calculation (min) (ACUTE ONLY): 15 min  Charges:  $Therapeutic Exercise: 8-22 mins                     D. Scott Akshita Italiano PT, DPT 10/01/20, 11:11 AM

## 2020-10-01 NOTE — Progress Notes (Signed)
Patient went into Afib with RVR, 130s while asleep, followed by a 1.92 sec pause and then conversion into her previous irregular SR with excessive ectopy. BP remained stable through event, 148/76. Patient continues to go in and out of Afib with RVR up to 120s, nonsustained.  Dr. Prudy Feeler notified. Awaiting further orders. Patient also had 3 episodes of diarrhea this shift. Request to hold Miralax and Colace pending.

## 2020-10-01 NOTE — Progress Notes (Signed)
Patient ID: Kristin [name], female   DOB: Apr 18, 1951, 70 y.o.   MRN: [id number]  Faythe Ghee to transfer out of ICU from cardiology standpoint.

## 2020-10-02 ENCOUNTER — Telehealth: Payer: Self-pay | Admitting: Cardiovascular Disease

## 2020-10-02 DIAGNOSIS — R001 Bradycardia, unspecified: Secondary | ICD-10-CM | POA: Diagnosis not present

## 2020-10-02 DIAGNOSIS — E119 Type 2 diabetes mellitus without complications: Secondary | ICD-10-CM

## 2020-10-02 DIAGNOSIS — J9601 Acute respiratory failure with hypoxia: Secondary | ICD-10-CM | POA: Diagnosis not present

## 2020-10-02 DIAGNOSIS — J441 Chronic obstructive pulmonary disease with (acute) exacerbation: Secondary | ICD-10-CM | POA: Diagnosis not present

## 2020-10-02 DIAGNOSIS — I5033 Acute on chronic diastolic (congestive) heart failure: Secondary | ICD-10-CM | POA: Diagnosis not present

## 2020-10-02 DIAGNOSIS — I350 Nonrheumatic aortic (valve) stenosis: Secondary | ICD-10-CM | POA: Diagnosis not present

## 2020-10-02 DIAGNOSIS — T17908A Unspecified foreign body in respiratory tract, part unspecified causing other injury, initial encounter: Secondary | ICD-10-CM | POA: Diagnosis not present

## 2020-10-02 DIAGNOSIS — J9621 Acute and chronic respiratory failure with hypoxia: Secondary | ICD-10-CM | POA: Diagnosis not present

## 2020-10-02 LAB — CBC WITH DIFFERENTIAL/PLATELET
Abs Immature Granulocytes: 0.05 10*3/uL (ref 0.00–0.07)
Basophils Absolute: 0 10*3/uL (ref 0.0–0.1)
Basophils Relative: 0 %
Eosinophils Absolute: 0.2 10*3/uL (ref 0.0–0.5)
Eosinophils Relative: 2 %
HCT: 35.3 % — ABNORMAL LOW (ref 36.0–46.0)
Hemoglobin: 11 g/dL — ABNORMAL LOW (ref 12.0–15.0)
Immature Granulocytes: 1 %
Lymphocytes Relative: 10 %
Lymphs Abs: 0.9 10*3/uL (ref 0.7–4.0)
MCH: 28.9 pg (ref 26.0–34.0)
MCHC: 31.2 g/dL (ref 30.0–36.0)
MCV: 92.7 fL (ref 80.0–100.0)
Monocytes Absolute: 0.7 10*3/uL (ref 0.1–1.0)
Monocytes Relative: 7 %
Neutro Abs: 7.7 10*3/uL (ref 1.7–7.7)
Neutrophils Relative %: 80 %
Platelets: 283 10*3/uL (ref 150–400)
RBC: 3.81 MIL/uL — ABNORMAL LOW (ref 3.87–5.11)
RDW: 14.5 % (ref 11.5–15.5)
WBC: 9.6 10*3/uL (ref 4.0–10.5)
nRBC: 0 % (ref 0.0–0.2)

## 2020-10-02 LAB — BASIC METABOLIC PANEL
Anion gap: 12 (ref 5–15)
BUN: 48 mg/dL — ABNORMAL HIGH (ref 8–23)
CO2: 39 mmol/L — ABNORMAL HIGH (ref 22–32)
Calcium: 9.1 mg/dL (ref 8.9–10.3)
Chloride: 88 mmol/L — ABNORMAL LOW (ref 98–111)
Creatinine, Ser: 1.22 mg/dL — ABNORMAL HIGH (ref 0.44–1.00)
GFR, Estimated: 48 mL/min — ABNORMAL LOW (ref >60)
Glucose, Bld: 181 mg/dL — ABNORMAL HIGH (ref 70–99)
Potassium: 3.9 mmol/L (ref 3.5–5.1)
Sodium: 139 mmol/L (ref 135–145)

## 2020-10-02 LAB — GLUCOSE, CAPILLARY
Glucose-Capillary: 149 mg/dL — ABNORMAL HIGH (ref 70–99)
Glucose-Capillary: 228 mg/dL — ABNORMAL HIGH (ref 70–99)
Glucose-Capillary: 273 mg/dL — ABNORMAL HIGH (ref 70–99)
Glucose-Capillary: 185 mg/dL — ABNORMAL HIGH (ref 70–99)

## 2020-10-02 LAB — MAGNESIUM: Magnesium: 2.1 mg/dL (ref 1.7–2.4)

## 2020-10-02 LAB — POTASSIUM: Potassium: 4.2 mmol/L (ref 3.5–5.1)

## 2020-10-02 MED ORDER — ORAL CARE MOUTH RINSE
15.0000 mL | Freq: Two times a day (BID) | OROMUCOSAL | Status: DC
  Administered 2020-10-02 – 2020-10-03 (×2): 15 mL via OROMUCOSAL

## 2020-10-02 MED ORDER — CARVEDILOL 3.125 MG PO TABS
3.1250 mg | ORAL_TABLET | Freq: Two times a day (BID) | ORAL | Status: DC
  Administered 2020-10-02 – 2020-10-03 (×2): 3.125 mg via ORAL
  Filled 2020-10-02 (×2): qty 1

## 2020-10-02 MED ORDER — DILTIAZEM HCL 25 MG/5ML IV SOLN: 5.0000 mg | Freq: Once | INTRAVENOUS | Status: DC

## 2020-10-02 MED ORDER — TORSEMIDE 20 MG PO TABS
20.0000 mg | ORAL_TABLET | Freq: Every day | ORAL | Status: DC
  Administered 2020-10-02 – 2020-10-03 (×2): 20 mg via ORAL
  Filled 2020-10-02 (×2): qty 1

## 2020-10-02 NOTE — Progress Notes (Signed)
Progress Note  Patient Name: Kristin [name] Date of Encounter: 10/02/2020  [facility] Cardiologist: Ida Rogue, MD   Subjective   Poor UOP overnight, net -10L. Weight overall down. AM labs, bMET pending. Patient feels breathing is better than yesterday, down to baseline O2 requirements. Appears euvolemic on exam.   Inpatient Medications    Scheduled Meds: . amiodarone  400 mg Oral BID  . amoxicillin-clavulanate  1 tablet Oral Q12H  . apixaban  5 mg Oral BID  . atorvastatin  80 mg Oral Daily  . budesonide (PULMICORT) nebulizer solution  0.5 mg Nebulization BID  . chlorhexidine  15 mL Mouth Rinse BID  . Chlorhexidine Gluconate Cloth  6 each Topical Daily  . citalopram  40 mg Oral Daily  . docusate sodium  100 mg Oral BID  . ezetimibe  10 mg Oral Daily  . famotidine  20 mg Oral Daily  . feeding supplement  237 mL Oral TID BM  . fenofibrate  160 mg Oral Daily  . ferrous sulfate  325 mg Oral Daily  . furosemide  40 mg Intravenous BID  . gabapentin  600 mg Oral BID  . insulin aspart  0-20 Units Subcutaneous TID WC  . insulin aspart  0-5 Units Subcutaneous QHS  . ipratropium-albuterol  3 mL Nebulization Q4H  . levothyroxine  75 mcg Oral Q0600  . mouth rinse  15 mL Mouth Rinse q12n4p  . multivitamin with minerals  1 tablet Oral Daily  . nicotine  21 mg Transdermal Daily  . polyethylene glycol  17 g Oral Daily  . predniSONE  40 mg Oral Q breakfast  . sodium chloride flush  3 mL Intravenous Q12H  . tiotropium  18 mcg Inhalation Daily   Continuous Infusions: . sodium chloride 5 mL/hr at 10/01/20 0600   PRN Meds: sodium chloride, acetaminophen, albuterol, ALPRAZolam, guaiFENesin-dextromethorphan, ondansetron (ZOFRAN) IV, sodium chloride flush   Vital Signs    Vitals:   10/02/20 0701 10/02/20 0800 10/02/20 0900 10/02/20 1000  BP:  (!) 99/38 128/62   Pulse: 71 63 73 72  Resp: 15 15 13    Temp:  98.3 F (36.8 C)    TempSrc:  Axillary    SpO2: 96% 93% 94% 93%   Weight:      Height:        Intake/Output Summary (Last 24 hours) at 10/02/2020 1104 Last data filed at 10/02/2020 0800 Gross per 24 hour  Intake 717 ml  Output 300 ml  Net 417 ml   Last 3 Weights 10/01/2020 09/30/2020 09/29/2020  Weight (lbs) 243 lb 9.7 oz 247 lb 2.2 oz 246 lb 7.6 oz  Weight (kg) 110.5 kg 112.1 kg 111.8 kg      Telemetry    NSR, occasional PVCs, 5bNSVT, HR 60s - Personally Reviewed  ECG    No new - Personally Reviewed  Physical Exam   GEN: No acute distress.   Neck: No JVD Cardiac: RRR, no murmurs, rubs, or gallops.  Respiratory: Wheezing GI: Soft, nontender, non-distended  MS: No edema; No deformity. Neuro:  Nonfocal  Psych: Normal affect   Labs    High Sensitivity Troponin:   Recent Labs  Lab 09/25/20 2158 09/25/20 2357 09/27/20 0313 09/27/20 0502  TROPONINIHS 12 11 18* 19*      Chemistry Recent Labs  Lab 09/25/20 2158 09/26/20 0150 09/27/20 0313 09/28/20 0416 09/29/20 0339 09/30/20 0519 10/01/20 0611 10/02/20 0341  NA 126*   < > 131*   < > 139 139  139  --   K 5.9*   < > 5.4*   < > 5.5* 4.9 3.9 4.2  CL 86*   < > 88*   < > 89* 87* 88*  --   CO2 31   < > 33*   < > 42* 43* 41*  --   GLUCOSE 155*   < > 181*   < > 148* 160* 93  --   BUN 58*   < > 56*   < > 54* 48* 45*  --   CREATININE 2.16*   < > 1.69*   < > 1.14* 1.09* 0.97  --   CALCIUM 8.0*   < > 8.8*   < > 9.3 9.2 9.0  --   PROT 6.9  --  7.3  --   --   --   --   --   ALBUMIN 3.0*  --  3.1*  --   --   --   --   --   AST 32  --  27  --   --   --   --   --   ALT 24  --  24  --   --   --   --   --   ALKPHOS 42  --  44  --   --   --   --   --   BILITOT 0.5  --  0.6  --   --   --   --   --   GFRNONAA 24*   < > 33*   < > 52* 55* >60  --   ANIONGAP 9   < > 10   < > 8 9 10   --    < > = values in this interval not displayed.     Hematology Recent Labs  Lab 09/30/20 0519 10/01/20 0611 10/02/20 0341  WBC 8.6 10.9* 9.6  RBC 3.89 4.03 3.81*  HGB 11.5* 11.8* 11.0*  HCT 36.1 37.0  35.3*  MCV 92.8 91.8 92.7  MCH 29.6 29.3 28.9  MCHC 31.9 31.9 31.2  RDW 14.3 14.6 14.5  PLT 338 332 283    BNP Recent Labs  Lab 09/25/20 2157 09/27/20 0313  BNP 1,972.2* 1,737.3*     DDimer No results for input(s): DDIMER in the last 168 hours.   Radiology    No results found.  Cardiac Studies    1/13/22biopsy done CXR IMPRESSION: 1. No pneumothorax after RIGHT lung biopsy. Expected post biopsy changes surrounding the nodule in the superior segment RIGHT LOWER LOBE. 2. Stable changes of chronic bronchitis and/or asthma.  MR brain 05/2020 without mets to brain  05/13/2020 NM PET IMPRESSION: 1. 2.1 by 1.4 cm right lower lobe pulmonary nodule is hypermetabolic with maximum SUV of 5.4, compatible with malignancy. No appreciable nodal or metastatic spread. 2. Mildly accentuated activity along the inferior endplate of L2, probably incidental. 3. Hypodense 1.5 by 1.2 cm right inferior thyroid lobe nodule. Recommend thyroid US (ref: J Am Coll Radiol. 2015 Feb;12(2): 143-50). 4. Other imaging findings of potential clinical significance: Chronic right maxillary sinusitis. Aortic Atherosclerosis (ICD10-I70.0). Coronary atherosclerosis. Mild cardiomegaly. Mitral valve calcification. Diffuse hepatic steatosis. Cholelithiasis. Complex but photopenic left kidney upper pole cyst, most likely to be benign.  06/15/2019 CTA chest and aorta IMPRESSION: 1. Vascular findings and measurements pertinent to potential TAVR procedure, as detailed above. 2. Severe thickening calcification of the aortic valve, compatible with reported clinical history of severe aortic stenosis. 3. 1.9 x 1.4  x 1.6 cm macrolobulated nodule in the superior segment of the right lower lobe, highly concerning for primary bronchogenic neoplasm. Further evaluation with PET-CT is recommended in the near future to better evaluate this finding and assess for potential metastatic disease. No definite  metastatic disease is identified in the chest, abdomen or pelvis on today's examination. 4. Aortic atherosclerosis, in addition to left main and 3 vessel coronary artery disease. 5. Severe calcifications of the mitral annulus. 6. Severe hepatic steatosis. 7. Colonic diverticulosis without evidence of acute diverticulitis at this time. 8. Additional incidental findings, as above. These results will be called to the ordering clinician or representative by the Radiologist Assistant, and communication documented in the PACS or [employer].  CTA abdomen and pelvis 04/14/2020 IMPRESSION: 1. Vascular findings and measurements pertinent to potential TAVR procedure, as detailed above. 2. Severe thickening calcification of the aortic valve, compatible with reported clinical history of severe aortic stenosis. 3. 1.9 x 1.4 x 1.6 cm macrolobulated nodule in the superior segment of the right lower lobe, highly concerning for primary bronchogenic neoplasm. Further evaluation with PET-CT is recommended in the near future to better evaluate this finding and assess for potential metastatic disease. No definite metastatic disease is identified in the chest, abdomen or pelvis on today's examination. 4. Aortic atherosclerosis, in addition to left main and 3 vessel coronary artery disease. 5. Severe calcifications of the mitral annulus. 6. Severe hepatic steatosis. 7. Colonic diverticulosis without evidence of acute diverticulitis at this time. 8. Additional incidental findings, as above. These results will be called to the ordering clinician or representative by the Radiologist Assistant, and communication documented in the PACS or [employer].  Cardiac CTA 04/14/2020 IMPRESSION: 1. Trileaflet aortic valve with moderately calcified leaflets and moderately restricted leaflet opening. Aortic valve calcium score 1257. (Severe if > 2000 in males). Annular measurements suitable  for delivery of a 26 mm Edwards-SAPIEN 3 Ultra valve. 2. Sufficient coronary to annulus distance. 3. Optimum Fluoroscopic Angle for Delivery: RAO 2 CRA 3. 4. No thrombus in the left atrial appendage.  US carotid 04/01/2020 IMPRESSION: Color duplex indicates minimal heterogeneous and calcified plaque, with no hemodynamically significant stenosis by duplex criteria in the extracranial cerebrovascular circulation.  R/LHC 03/31/2020  Prox RCA lesion is 30% stenosed.  Mid RCA lesion is 40% stenosed.  RPDA lesion is 30% stenosed.  Prox Cx lesion is 60% stenosed.  Mid LAD lesion is 20% stenosed.  Mid Cx to Dist Cx lesion is 20% stenosed. 1. Mild to moderate nonobstructive coronary artery disease. Worst stenosis is 60% in the proximal left circumflex. No evidence of obstructive disease. Coronary arteries are overall moderately calcified. 2. Right heart catheterization showed moderately elevated left-sided filling pressures, moderate pulmonary hypertension and moderately reduced cardiac output. 3. Severe aortic stenosis with mean gradient of 31 mmHg and calculated valve area of 0.7 cm. Recommendations: The patient is significantly volume overloaded. I switched furosemide to intravenous 20 mg twice daily. I increased carvedilol for better blood pressure control. Recommend medical therapy for nonobstructive coronary artery disease. Recommend outpatient TAVR evaluation.  Echo 03/29/2020 1. Left ventricular ejection fraction, by estimation, is 55 to 60%. The  left ventricle has normal function. The left ventricle has no regional  wall motion abnormalities. There is mild left ventricular hypertrophy.  Left ventricular diastolic parameters  are consistent with Grade I diastolic dysfunction (impaired relaxation).  2. Right ventricular systolic function is normal. The right ventricular  size is normal. Tricuspid regurgitation signal is inadequate for assessing  PA pressure.   3. Left atrial size was mildly dilated.  4. The aortic valve was not well visualized. Moderate to severe aortic  valve stenosis. Aortic valve area, by VTI measures 1.11 cm. Aortic valve  mean gradient measures 31.0 mmHg. Aortic valve Vmax measures 3.66 m/s.   Patient Profile     70 y.o. female with pmh of HFpEF, severe aortic calcification with stenosis and current TAVR/CTS evaluation, lung cancer, COPD, OSA, chronic respiratory failure on 3L O2, tobacco use, DM2, Paoroxysmal afib on amiodarone and ELiquis (no AV nodal blocking agents with post-termination pauses), mod to severe AS who presented with volume overload and SOB, diagnosed with possible PNA, beign seen for CHF and PAF.   Assessment & Plan    HFpEF - Volume status improving with IV lasix 40mg  BID - Net -10L since admission - weight down 270>243lbs, which appears to be around dry weight - kidney function stable, electrolytes wnl - Plan to transition to oral diuretic, recommended torsemide daily - PTA lasix 20mg  dialy - ?with BB, PTA coreg however other notes say to avoid BB with post-termination pause and Afib - restart home ARB - I will switch to torsemide 20mg  daily  Paroxysmal Afib - in NSR - coreg held - continue amiodarone 400mg  BID - continue Eliquis 5 mg BID  Mod to severe AS - continue outpatient TAVR work-up  Right shoulder displacement - per ortho  Respiratory failure with hypoxia/chronic respiratory failure on 3L O2 - multifactorial given CHF with mod to severe AS, severe COPD, severe sleep apnea, suspected aspiration PNA - O2 requirements improving - abx per IM - IV lasix for CHF  AKI on CKD stage 3 - resolved  NSVT - 5 beats on tele - Prior notes say no BB with post-termination pauses and bradycardia, however records show PTA coreg 3.125mg  BID. Will defer to MD  For questions or updates, please contact [place] Please consult [email or web address] for contact info under         Signed, Kerin Kren Ninfa Meeker, PA-C  10/02/2020, 11:04 AM

## 2020-10-02 NOTE — NC FL2 (Signed)
[place] LEVEL OF CARE SCREENING TOOL     IDENTIFICATION  Patient Name: Kristin [name] Birthdate: 08/14/1950 Sex: female Admission Date (Current Location): 09/25/2020  [place] and [place] Number:  Engineering geologist and Address:  [facility], [address], [place], [place] [address]      Provider Number: [id number]  Attending Physician Name and Address:  Fritzi Mandes, MD  Relative Name and Phone Number:  Vanessa Barbara (Daughter)   [phone]    Current Level of Care: Hospital Recommended Level of Care: [place] Prior Approval Number:    Date Approved/Denied:   PASRR Number: [id number]  Discharge Plan: SNF    Current Diagnoses: Patient Active Problem List   Diagnosis Date Noted  . Aspiration into airway   . Paroxysmal atrial fibrillation (HCC)   . CAD (coronary artery disease) 09/26/2020  . Hyperkalemia 09/26/2020  . CHF exacerbation ([place]) 09/26/2020  . Acute on chronic respiratory failure with hypoxia and hypercapnia ([place]) 09/25/2020  . Bradycardia 09/25/2020  . Lung cancer ([place]) 05/26/2020  . Atrial flutter with rapid ventricular response ([place]) 05/26/2020  . Demand ischemia ([place])   . Acute on chronic heart failure with preserved ejection fraction (HFpEF) ([place])   . Aortic stenosis-moderate to severe 03/2020   . Obesity, Class III, BMI 40-49.9 (morbid obesity) ([place]) 03/29/2020  . Chronic respiratory failure with hypoxia ([place]) 03/29/2020  . OSA (obstructive sleep apnea) 03/29/2020  . Essential hypertension 03/29/2020  . Hypothyroidism 03/29/2020  . Chest pain 03/29/2020  . Elevated troponin 03/29/2020  . Chronic diastolic (congestive) heart failure ([place]) 07/12/2018  . Type 2 diabetes mellitus without complication ([place]) 11/94/1740  . COPD exacerbation ([place]) 07/12/2018  . Tobacco use 07/12/2018  . Hypotension 07/12/2018  . Respiratory failure ([place]) 07/04/2018    Orientation RESPIRATION BLADDER  Height & Weight     Self,Place,Situation  Normal Continent Weight: 243 lb 9.7 oz (110.5 kg) Height:  5\' 2"  (157.5 cm)  BEHAVIORAL SYMPTOMS/MOOD NEUROLOGICAL BOWEL NUTRITION STATUS      Continent Diet  AMBULATORY STATUS COMMUNICATION OF NEEDS Skin   Limited Assist Verbally Normal                       Personal Care Assistance Level of Assistance  Bathing,Feeding,Dressing,Total care Bathing Assistance: Limited assistance Feeding assistance: Limited assistance Dressing Assistance: Limited assistance Total Care Assistance: Limited assistance   Functional Limitations Info  Sight,Hearing,Speech Sight Info: Adequate Hearing Info: Adequate Speech Info: Adequate    SPECIAL CARE FACTORS FREQUENCY       PT/OT 5X per week                  Contractures Contractures Info: Not present    Additional Factors Info                  Current Medications (10/02/2020):  This is the current hospital active medication list Current Facility-Administered Medications  Medication Dose Route Frequency Provider Last Rate Last Admin  . 0.9 %  sodium chloride infusion  250 mL Intravenous PRN Athena Masse, MD 5 mL/hr at 10/01/20 0600 Infusion Verify at 10/01/20 0600  . acetaminophen (TYLENOL) tablet 650 mg  650 mg Oral Q4H PRN Judd Gaudier V, MD      . albuterol (PROVENTIL) (2.5 MG/3ML) 0.083% nebulizer solution 2.5 mg  2.5 mg Nebulization Q2H PRN Athena Masse, MD   2.5 mg at 09/27/20 0124  . ALPRAZolam (XANAX) tablet 0.5 mg  0.5 mg Oral QHS PRN Rust-[place]ster, Toribio Harbour L, NP   0.5 mg at 10/01/20 2223  . amiodarone (PACERONE) tablet 400 mg  400 mg Oral BID Constance Haw, MD   400 mg at 10/01/20 2215  . amoxicillin-clavulanate (AUGMENTIN) 875-125 MG per tablet 1 tablet  1 tablet Oral Q12H Fritzi Mandes, MD   1 tablet at 10/01/20 2215  . apixaban (ELIQUIS) tablet 5 mg  5 mg Oral BID Domenic Polite, MD   5 mg at 10/01/20 2215  . atorvastatin (LIPITOR) tablet 80 mg  80 mg Oral Daily  Theora Gianotti, NP   80 mg at 10/01/20 0910  . budesonide (PULMICORT) nebulizer solution 0.5 mg  0.5 mg Nebulization BID Awilda Bill, NP   0.5 mg at 10/02/20 0720  . chlorhexidine (PERIDEX) 0.12 % solution 15 mL  15 mL Mouth Rinse BID Awilda Bill, NP   15 mL at 10/01/20 2214  . Chlorhexidine Gluconate Cloth 2 % PADS 6 each  6 each Topical Daily Flora Lipps, MD   6 each at 10/01/20 0910  . citalopram (CELEXA) tablet 40 mg  40 mg Oral Daily Fritzi Mandes, MD   40 mg at 10/01/20 1236  . docusate sodium (COLACE) capsule 100 mg  100 mg Oral BID Dallie Piles, RPH   100 mg at 09/30/20 2135  . ezetimibe (ZETIA) tablet 10 mg  10 mg Oral Daily Fritzi Mandes, MD   10 mg at 10/01/20 1236  . famotidine (PEPCID) tablet 20 mg  20 mg Oral Daily Domenic Polite, MD   20 mg at 10/01/20 0859  . feeding supplement (ENSURE ENLIVE / ENSURE PLUS) liquid 237 mL  237 mL Oral TID BM Flora Lipps, MD   237 mL at 10/01/20 2223  . fenofibrate tablet 160 mg  160 mg Oral Daily Fritzi Mandes, MD   160 mg at 10/01/20 1236  . ferrous sulfate tablet 325 mg  325 mg Oral Daily Fritzi Mandes, MD   325 mg at 10/01/20 1236  . furosemide (LASIX) injection 40 mg  40 mg Intravenous BID Athena Masse, MD   40 mg at 10/01/20 1720  . gabapentin (NEURONTIN) tablet 600 mg  600 mg Oral BID Fritzi Mandes, MD   600 mg at 10/01/20 2215  . guaiFENesin-dextromethorphan (ROBITUSSIN DM) 100-10 MG/5ML syrup 5 mL  5 mL Oral Q4H PRN Fritzi Mandes, MD   5 mL at 10/01/20 1505  . insulin aspart (novoLOG) injection 0-20 Units  0-20 Units Subcutaneous TID WC Athena Masse, MD   15 Units at 10/01/20 1719  . insulin aspart (novoLOG) injection 0-5 Units  0-5 Units Subcutaneous QHS Athena Masse, MD   2 Units at 09/28/20 2124  . ipratropium-albuterol (DUONEB) 0.5-2.5 (3) MG/3ML nebulizer solution 3 mL  3 mL Nebulization Q4H Flora Lipps, MD   3 mL at 10/02/20 0720  . levothyroxine (SYNTHROID) tablet 75 mcg  75 mcg Oral Q0600 Dallie Piles, RPH    75 mcg at 10/02/20 0533  . MEDLINE mouth rinse  15 mL Mouth Rinse q12n4p Awilda Bill, NP   15 mL at 09/29/20 1610  . multivitamin with minerals tablet 1 tablet  1 tablet Oral Daily Flora Lipps, MD   1 tablet at 10/01/20 0910  . nicotine (NICODERM CQ - dosed in mg/24 hours) patch 21 mg  21 mg Transdermal Daily Fritzi Mandes, MD   21 mg at 10/01/20 1829  . ondansetron (ZOFRAN) injection 4 mg  4 mg Intravenous  Q6H PRN Athena Masse, MD   4 mg at 09/27/20 0005  . polyethylene glycol (MIRALAX / GLYCOLAX) packet 17 g  17 g Oral Daily Dallie Piles, RPH   17 g at 09/30/20 0913  . predniSONE (DELTASONE) tablet 40 mg  40 mg Oral Q breakfast Fritzi Mandes, MD      . sodium chloride flush (NS) 0.9 % injection 3 mL  3 mL Intravenous Q12H Athena Masse, MD   3 mL at 10/01/20 2215  . sodium chloride flush (NS) 0.9 % injection 3 mL  3 mL Intravenous PRN Athena Masse, MD      . tiotropium [facility]) inhalation capsule ([facility] use ONLY) 18 mcg  18 mcg Inhalation Daily Flora Lipps, MD   18 mcg at 10/01/20 0932     Discharge Medications: Please see discharge summary for a list of discharge medications.  Relevant Imaging Results:  Relevant Lab Results:   Additional Information SS# [id number]  Adelene Amas, LCSWA

## 2020-10-02 NOTE — Plan of Care (Signed)
Pt OOB to chair x 2 this shift, able transfer w/min assist.  2LNC entire shift, no episodes of AFib today as of this writing.  A&O x 4.  Daughter in room all afternoon.  Pt expresses desire to be discharged to STR and then to home.

## 2020-10-02 NOTE — Progress Notes (Signed)
[place] at [place] NAME: Kristin [name]    MR#:  [id number]  DATE OF BIRTH:  [birth year]  SUBJECTIVE:   Patient feels a lot better.  Good urine output. Breathing much better. On chronic oxygen. REVIEW OF SYSTEMS:   Review of Systems  Constitutional: Negative for chills, fever and weight loss.  HENT: Negative for ear discharge, ear pain and nosebleeds.   Eyes: Negative for blurred vision, pain and discharge.  Respiratory: Negative for sputum production, shortness of breath, wheezing and stridor.   Cardiovascular: Negative for chest pain, palpitations, orthopnea and PND.  Gastrointestinal: Negative for abdominal pain, diarrhea, nausea and vomiting.  Genitourinary: Negative for frequency and urgency.  Musculoskeletal: Positive for joint pain. Negative for back pain.  Neurological: Positive for weakness. Negative for sensory change, speech change and focal weakness.  Psychiatric/Behavioral: Negative for depression and hallucinations. The patient is not nervous/anxious.    Tolerating Diet:yes Tolerating PT: rehab  DRUG ALLERGIES:   Allergies  Allergen Reactions  . Altace [Ramipril] Swelling    VITALS:  Blood pressure (!) 125/25, pulse 75, temperature 98.3 F (36.8 C), temperature source Axillary, resp. rate 18, height 5\' 2"  (1.575 m), weight 110.5 kg, SpO2 92 %.  PHYSICAL EXAMINATION:   Physical Exam  GENERAL:  70 y.o.-year-old patient lying in the bed with no acute distress. Obeses LUNGS: decreased breath sounds bilaterally, no wheezing, rales, rhonchi. No use of accessory muscles of respiration.  CARDIOVASCULAR: S1, S2 normal. No murmurs, rubs, or gallops.  ABDOMEN: Soft, nontender, nondistended. Bowel sounds present. No organomegaly or mass. Abdominal obesity EXTREMITIES: No cyanosis, clubbing or edema b/l.   Right UE sling+ NEUROLOGIC: Cranial nerves II through XII are intact. No focal Motor or sensory deficits b/l.    PSYCHIATRIC:  patient is alert and oriented x 3.  SKIN: No obvious rash, lesion, or ulcer.   LABORATORY PANEL:  CBC Recent Labs  Lab 10/02/20 0341  WBC 9.6  HGB 11.0*  HCT 35.3*  PLT 283    Chemistries  Recent Labs  Lab 09/27/20 0313 09/28/20 0416 10/02/20 0341 10/02/20 1217  NA 131*   < >  --  139  K 5.4*   < > 4.2 3.9  CL 88*   < >  --  88*  CO2 33*   < >  --  39*  GLUCOSE 181*   < >  --  181*  BUN 56*   < >  --  48*  CREATININE 1.69*   < >  --  1.22*  CALCIUM 8.8*   < >  --  9.1  MG 2.1   < > 2.1  --   AST 27  --   --   --   ALT 24  --   --   --   ALKPHOS 44  --   --   --   BILITOT 0.6  --   --   --    < > = values in this interval not displayed.   Cardiac Enzymes No results for input(s): TROPONINI in the last 168 hours. RADIOLOGY:  No results found. 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  Acute on chronic hypoxic respiratory failure secondary to acute on chronic diastolic congestive heart failure with severe aortic stenosis End-stage COPD with exacerbation Severe sleep apnea Suspected aspiration pneumonia -- patient overall improving. She is down to 3 L nasal cannula oxygen. Patient wears oxygen chronically at home. -- Switch to oral Augmentin will treat for 5 days -- PO prednisone taper -- continue bronchodilators and inhalers as needed -- IV Lasix  40 mg BID-- diuresis well.--- Now on torsemide 20 mg daily  Paroxysmal atrial flutter/a fib with RVR with intermittent bradycardia -- Coreg on hold -- continue amiodarone. Continue eliquis  Severe AS --Dr Myrna Blazer is following as out pt  Acute metabolic encephalopathy -- multifactorial from above reasons -- resolved  AK I on CKD III -- creatinine improving.  Type II diabetes with CKD -- sugars stable continue sliding scale insulin for now  Non-small cell lung cancer with history of heavy tobacco abuse -- recently diagnosed and treated with radiation treatment in March 2022  Close fracture proximal end of right humerus 09/12/2020 with mechanical fall at home -- continue sling/immobilization and follow-up with orthopedic as per instruction  Hypothyroidism  --continue Synthroid  Morbid obesity with severe sleep apnea -- intermittent use of BiPAP -- continue oxygen  Procedures: Family communication :dter Kristin [name] on the phone 5/18 Consults : palliative care, cardiology CODE STATUS: DNR DVT Prophylaxis : eliquis Level of care: Med-Surg Status is: Inpatient  Remains inpatient appropriate because:Inpatient level of care appropriate due to severity of illness   Dispo: The patient is from: Home              Anticipated d/c is to: SNF              Patient currently is best optimized for discharge. Awaiting rehab bed. TOC for discharge planning.   Difficult to place patient No        TOTAL TIME TAKING CARE OF THIS PATIENT: 30 minutes.  >50% time spent on counselling and coordination of care  Note: This dictation was prepared with Dragon dictation along with smaller phrase technology. Any transcriptional errors that result from this process are unintentional.  Kristin [name] M.D    [facility]   CC: Primary care physician; [facility]Patient ID: Kristin [name], female   DOB: 1951/01/30, 70 y.o.   MRN: [id number]

## 2020-10-02 NOTE — TOC Progression Note (Signed)
Transition of Care [facility]) - Progression Note    Patient Details  Name: Kristin [name] MRN: [id number] Date of Birth: 11/16/1950  Transition of Care [facility]) CM/SW [place], [place] Phone Number: [phone] 10/02/2020, 9:10 AM  Clinical Narrative:     CSW started SNF bed search.  Patient's sister Waynetta Sandy [phone] stated they would prefer a facility in [place] but understood placement location could not be guaranteed.  QDIYM#[id number]  Expected Discharge Plan: [place] Barriers to Discharge: Continued Medical Work up  Expected Discharge Plan and Services Expected Discharge Plan: [place] In-house Referral: Clinical Social Work     Living arrangements for the past 2 months: Single Family Home                                       Social Determinants of Health (SDOH) Interventions    Readmission Risk Interventions No flowsheet data found.

## 2020-10-02 NOTE — Progress Notes (Signed)
Patient went into AFIB up to 130's when getting back in bed, message to [facility] made aware. Patient will have a pause then into SB 50's then back in to AFIB 100-130's, Dr.Mansey aware.

## 2020-10-02 NOTE — Progress Notes (Signed)
Report given to [facility] RN, patient transported to RM 256.

## 2020-10-02 NOTE — Progress Notes (Signed)
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 Pt presented to the ED on 5/12 with respiratory distress, she was noted to be tachypneic and treated with BiPAP. Pt adm to ICU for monitoring, currently on [place].    PT Comments    Pt was pleasant and motivated to participate during the session and made good progress towards goals.  Pt required only extra time and effort with bed mobility tasks and min A with transfers.  Pt was able to ambulate 10 feet this session with slow cadence and short B step length with fair stability with min lean on this PT's hand for support with HHA.  Pt's SpO2 and HR were both WNL during the session with pt reporting no adverse symptoms.  Pt declined to use the RUE sling during the session, nursing notified.  Pt will benefit from PT services in a SNF setting upon discharge to safely address deficits listed in patient problem list for decreased caregiver assistance and eventual return to PLOF.     Follow Up Recommendations  SNF     Equipment Recommendations  Other (comment) (TBD at next venue of care)    Recommendations for Other Services       Precautions / Restrictions Precautions Precautions: Fall Required Braces or Orthoses: Sling Restrictions Weight Bearing Restrictions: Yes RUE Weight Bearing: Non weight bearing    Mobility  Bed Mobility Overal bed mobility: Modified Independent             General bed mobility comments: Extra time and effort only     Transfers Overall transfer level: Needs assistance Equipment used: 1 person hand held assist Transfers: Sit to/from Stand Sit to Stand: Min assist         General transfer comment: Min A for stability upon initial stand  Ambulation/Gait Ambulation/Gait assistance: Min guard Gait Distance (Feet): 10 Feet Assistive device: 1 person hand held assist Gait Pattern/deviations: Step-through pattern;Decreased step length - right;Decreased step length - left Gait velocity: decreased   General Gait Details: Slow cadence with short B step length but steady without LOB and with SpO2 and HR WNL   Stairs             Wheelchair Mobility    Modified Rankin (Stroke Patients Only)       Balance Overall balance assessment: Needs assistance Sitting-balance support: No upper extremity supported;Feet unsupported Sitting balance-Leahy Scale: Good     Standing balance support: Single extremity supported Standing balance-Leahy Scale: Fair                              Cognition Arousal/Alertness: Awake/alert Behavior During Therapy: WFL for tasks assessed/performed Overall Cognitive Status: Within Functional Limits for tasks assessed                                        Exercises Total Joint Exercises Ankle Circles/Pumps: AROM;Strengthening;Both;10 reps Quad Sets: Strengthening;Both;10  reps Gluteal Sets: Strengthening;Both;10 reps Heel Slides: AROM;Strengthening;Both;5 reps Hip ABduction/ADduction: AROM;Strengthening;Both;5 reps Long Arc Quad: AROM;Strengthening;Both;10 reps    General Comments        Pertinent Vitals/Pain Pain Assessment: 0-10 Pain Score: 5  Pain Location: RUE Pain Descriptors / Indicators: Sore Pain Intervention(s): Premedicated before session;Monitored during session    Home Living                      Prior Function            PT Goals (current goals can now be found in the care plan section)  Progress towards PT goals: Progressing toward goals    Frequency    Min 2X/week      PT Plan Current plan remains appropriate    Co-evaluation              AM-PAC PT "6 Clicks" Mobility   Outcome Measure  Help needed turning from your back to your side while in a flat bed without using bedrails?: A Little Help needed moving from lying on your back to sitting on the side of a flat bed without using bedrails?: A Little Help needed moving to and from a bed to a chair (including a wheelchair)?: A Little Help needed standing up from a chair using your arms (e.g., wheelchair or bedside chair)?: A Little Help needed to walk in hospital room?: A Little Help needed climbing 3-5 steps with a railing? : A Lot 6 Click Score: 17    End of Session Equipment Utilized During Treatment: Oxygen;Gait belt Activity Tolerance: Patient tolerated treatment well;No increased pain Patient left: Other (comment);with family/visitor present (Pt left on BSC with dtr present, nursing notified) Nurse Communication: Mobility status PT Visit Diagnosis: Unsteadiness on feet (R26.81);Muscle weakness (generalized) (M62.81);Difficulty in walking, not elsewhere classified (R26.2)     Time: 9794-8016 PT Time Calculation (min) (ACUTE ONLY): 23 min  Charges:  $Gait Training: 8-22 mins $Therapeutic Exercise: 8-22 mins                     D. Scott Emelly Wurtz PT, DPT 10/02/20, 5:30 PM

## 2020-10-02 NOTE — Telephone Encounter (Signed)
PT IS IN ICU CURRENT APPT IS FOR 06/27 W/ [place],  HEART RATE IS BETWEEN 73-80, OXYGEN IS 90, ER IS ASKING FOR A SOONER APPT, PT IS ALSO A CANCER PT

## 2020-10-03 ENCOUNTER — Ambulatory Visit: Payer: 59 | Admitting: Family

## 2020-10-03 DIAGNOSIS — I350 Nonrheumatic aortic (valve) stenosis: Secondary | ICD-10-CM | POA: Diagnosis not present

## 2020-10-03 DIAGNOSIS — J441 Chronic obstructive pulmonary disease with (acute) exacerbation: Secondary | ICD-10-CM | POA: Diagnosis not present

## 2020-10-03 DIAGNOSIS — G4733 Obstructive sleep apnea (adult) (pediatric): Secondary | ICD-10-CM

## 2020-10-03 DIAGNOSIS — Z7189 Other specified counseling: Secondary | ICD-10-CM | POA: Diagnosis not present

## 2020-10-03 DIAGNOSIS — I5033 Acute on chronic diastolic (congestive) heart failure: Secondary | ICD-10-CM | POA: Diagnosis not present

## 2020-10-03 DIAGNOSIS — J9621 Acute and chronic respiratory failure with hypoxia: Secondary | ICD-10-CM | POA: Diagnosis not present

## 2020-10-03 DIAGNOSIS — Z515 Encounter for palliative care: Secondary | ICD-10-CM | POA: Diagnosis not present

## 2020-10-03 DIAGNOSIS — J9601 Acute respiratory failure with hypoxia: Secondary | ICD-10-CM | POA: Diagnosis not present

## 2020-10-03 LAB — RESP PANEL BY RT-PCR (FLU A&B, COVID) ARPGX2
Influenza A by PCR: NEGATIVE
Influenza B by PCR: NEGATIVE
SARS Coronavirus 2 by RT PCR: NEGATIVE

## 2020-10-03 LAB — GLUCOSE, CAPILLARY
Glucose-Capillary: 107 mg/dL — ABNORMAL HIGH (ref 70–99)
Glucose-Capillary: 170 mg/dL — ABNORMAL HIGH (ref 70–99)
Glucose-Capillary: 176 mg/dL — ABNORMAL HIGH (ref 70–99)

## 2020-10-03 MED ORDER — TORSEMIDE 20 MG PO TABS: 40.0000 mg | ORAL_TABLET | Freq: Every day | ORAL | Status: DC

## 2020-10-03 MED ORDER — AMIODARONE HCL 400 MG PO TABS: 400.0000 mg | ORAL_TABLET | Freq: Two times a day (BID) | ORAL | 0 refills | Status: DC

## 2020-10-03 MED ORDER — INSULIN ASPART 100 UNIT/ML IJ SOLN: 3.0000 [IU] | Freq: Three times a day (TID) | INTRAMUSCULAR | Status: DC

## 2020-10-03 MED ORDER — PREDNISONE 20 MG PO TABS: 30.0000 mg | ORAL_TABLET | Freq: Every day | ORAL | Status: DC

## 2020-10-03 MED ORDER — AMIODARONE HCL 200 MG PO TABS: 200.0000 mg | ORAL_TABLET | Freq: Two times a day (BID) | ORAL | 0 refills | Status: DC

## 2020-10-03 MED ORDER — TORSEMIDE 40 MG PO TABS: 40.0000 mg | ORAL_TABLET | Freq: Every day | ORAL | 0 refills | Status: DC

## 2020-10-03 MED ORDER — BACID PO TABS
2.0000 | ORAL_TABLET | Freq: Three times a day (TID) | ORAL | Status: DC
  Filled 2020-10-03 (×4): qty 2

## 2020-10-03 MED ORDER — COVID-19 MRNA VAC-TRIS(PFIZER) 30 MCG/0.3ML IM SUSP
0.3000 mL | Freq: Once | INTRAMUSCULAR | Status: AC
  Administered 2020-10-03: 0.3 mL via INTRAMUSCULAR
  Filled 2020-10-03: qty 0.3

## 2020-10-03 NOTE — TOC Progression Note (Addendum)
Transition of Care [facility]) - Progression Note    Patient Details  Name: Kristin [name] MRN: [id number] Date of Birth: 1950-10-15  Transition of Care [facility]) CM/SW [place], [place] Phone Number: 10/03/2020, 9:07 AM  Clinical Narrative:     Patient pending insurance authorization Josem Kaufmann request sent 10/03/2020), for SNF placement at [facility].   Expected Discharge Plan: [place] Barriers to Discharge: Continued Medical Work up  Expected Discharge Plan and Services Expected Discharge Plan: [place] In-house Referral: Clinical Social Work     Living arrangements for the past 2 months: Single Family Home                                       Social Determinants of Health (SDOH) Interventions    Readmission Risk Interventions No flowsheet data found.

## 2020-10-03 NOTE — Telephone Encounter (Signed)
This was addressed in a phone note by Theodosia Quay on 09/30/20. If she needs sooner hospital follow up it should be in the [place] office with Dr. Rockey Situ. I can see her in June as planned. Gerald Stabs

## 2020-10-03 NOTE — TOC Transition Note (Signed)
Transition of Care [facility]) - CM/SW Discharge Note   Patient Details  Name: Kristin [name] MRN: [id number] Date of Birth: September 12, 1950  [facility] of [facility]) CM/SW Contact:  Alberteen Sam, LCSW Phone Number: 10/03/2020, 3:47 PM   Clinical Narrative:     Patient will DC to: [facility] Anticipated DC date: 10/03/20 Transport [id number]  Per MD patient ready for DC to [facility] . RN, patient, patient's family, and facility notified of DC. Discharge Summary sent to facility. RN given number for report [phone] . DC packet on chart. Ambulance transport requested for patient.  CSW signing off.  Pricilla Riffle, LCSW    Final next level of care: Skilled Nursing Facility Barriers to Discharge: No Barriers Identified   Patient Goals and CMS Choice Patient states their goals for this hospitalization and ongoing recovery are:: to go to SNF CMS Medicare.gov Compare Post Acute Care list provided to:: Patient Choice offered to / list presented to : Patient  Discharge Placement              Patient [facility] Patient to be transferred to facility by: [facility]   Patient and family notified of of transfer: 10/03/20  Discharge Plan and Services In-house Referral: Clinical Social Work                                   Social Determinants of Health (SDOH) Interventions     Readmission Risk Interventions No flowsheet data found.

## 2020-10-03 NOTE — TOC Progression Note (Signed)
Transition of Care [facility]) - Progression Note    Patient Details  Name: Kristin [name] MRN: [id number] Date of Birth: May 12, 1951  Transition of Care [facility]) CM/SW [place], [place] Phone Number: 10/03/2020, 1:55 PM  Clinical Narrative:     Per conversation with Dr. Posey Pronto, no CPAP needed for dc to [facility] today however recommended patient should follow up with PCP regarding this.   [facility] made aware of this and that we have insurance auth. Pending DC summary and orders for patient transfer to [facility].   Expected Discharge Plan: [place] Barriers to Discharge: Continued Medical Work up  Expected Discharge Plan and Services Expected Discharge Plan: [place] In-house Referral: Clinical Social Work     Living arrangements for the past 2 months: Single Family Home                                       Social Determinants of Health (SDOH) Interventions    Readmission Risk Interventions No flowsheet data found.

## 2020-10-03 NOTE — Progress Notes (Addendum)
Pt is having multiple stool and is requesting to help with it. NP Randol Kern made aware. Will continue to monitor.  Update 0157: NP Randol Kern ordered to hold colace if pt having loose stool and added BACID 2 tablet oral TID.Will continue to monitor.

## 2020-10-03 NOTE — Discharge Summary (Addendum)
[facility] NAME: Kristin [name]    MR#:  [id number]  DATE OF BIRTH:  [birth year]  DATE OF ADMISSION:  09/25/2020 ADMITTING PHYSICIAN: Athena Masse, MD  DATE OF DISCHARGE: 10/03/2020  PRIMARY CARE PHYSICIAN: [place]    ADMISSION DIAGNOSIS:  COPD exacerbation ([place]) [J44.1] CHF exacerbation ([place]) [I50.9] Acute on chronic diastolic congestive heart failure (HCC) [I50.33] Acute respiratory failure with hypoxemia (HCC) [J96.01] Acute on chronic respiratory failure with hypoxia and hypercapnia (HCC) [J96.21, J96.22]  DISCHARGE DIAGNOSIS:  acute on chronic hypoxic respiratory failure along with hypercapnia secondary to COPD exacerbation acute on chronic diastolic heart failure suspected sleep apnea severe AS-- chronic  SECONDARY DIAGNOSIS:   Past Medical History:  Diagnosis Date  . Anxiety   . Asthma   . CHF (congestive heart failure) ([place])    2018  . COPD (chronic obstructive pulmonary disease) ([place])   . Diabetes mellitus without complication ([place])   . Dyspnea   . GERD (gastroesophageal reflux disease)   . History of hiatal hernia   . History of orthopnea   . History of radiation therapy 07/29/20-08/08/20   Right Lung, SBRT Dr. Gery Pray  . Hypothyroidism   . Neuropathy   . Oxygen deficiency    3L/HS  . Pain    BACK/DDD  . Peripheral vascular disease ([place])   . RLS (restless legs syndrome)   . Sleep apnea   . Wheezing     HOSPITAL COURSE:  Kristin [name] is a chronically ill morbidly obese female with history of nonobstructive CAD, severe aortic stenosis being evaluated for TAVR, chronic diastolic CHF, advanced COPD/chronic respiratory failure on 3 L home O2, history of atrial flutter on amiodarone and apixaban, OSA not on CPAP, hypothyroidism, morbid obesity, right lower lobe lung cancer status post XRT, diabetes, right humeral fracture (a week prior to admission)presented to the ED on 5/12 with  respiratory distress, she was noted to be tachypneic and treated with BiPAP briefly,she was admitted to the ICU, chest x-ray was concerning for pulmonary vascular congestion.  Acute on chronic hypoxic respiratory failure secondary to acute on chronic diastolic congestive heart failure with severe aortic stenosis End-stage COPD with exacerbation Severe sleep apnea Suspected aspiration pneumonia -- patient overall improving. She is down to 3 L nasal cannula oxygen. Patient wears oxygen chronically at home. -- Switch to oral Augmentin will treat for 5 days -- PO prednisone taper -- continue bronchodilators and inhalers as needed -- IV Lasix 40 mg BID-- diuresis well.--- Now on torsemide 20 mg daily --CPAP ordered for discharge however the rehab facility will not accept the equipment through [facility]. Patient is recommended to follow-up with PCP after discharge from rehab to get formal sleep study and possible CPAP at home thereafter. For now she will continue oxygen continuously.  Paroxysmal atrial flutter/a fib with RVR with intermittent bradycardia -- Coreg now resumed--hold if SBP <105 -- continue amiodarone per cardiology dosing 400mg  bid x 5 days then 200 mg qd -- Continue eliquis  Severe AS --Dr Myrna Blazer is following as out pt for possible TAVR  Acute metabolic encephalopathy -- multifactorial from above reasons -- resolved  AK I on CKD III -- creatinine improving.  Type II diabetes with CKD -- sugars stable continue sliding scale insulin for now  Non-small cell lung cancer with history of heavy tobacco abuse -- recently diagnosed and treated with radiation treatment in March 2022  Close fracture proximal end of right humerus 09/12/2020  with mechanical fall at home -- continue sling/immobilization and follow-up with orthopedic as per instruction  Hypothyroidism  --continue Synthroid  Morbid obesity with severe sleep apnea -- intermittent use of BiPAP -- continue  oxygen  Procedures: Family communication :dter in the room Consults : palliative care, cardiology CODE STATUS: DNR DVT Prophylaxis : eliquis Level of care: Med-Surg Status is: Inpatient     Dispo: The patient is from: Home  Anticipated d/c is to: SNF  Patient currently is best optimized for discharge to rehab             Difficult to place patient No  CONSULTS OBTAINED:    DRUG ALLERGIES:   Allergies  Allergen Reactions  . Altace [Ramipril] Swelling    DISCHARGE MEDICATIONS:   Allergies as of 10/03/2020      Reactions   Altace [ramipril] Swelling      Medication List    STOP taking these medications   [facility] Aspirin Adult Low Dose 81 MG chewable tablet Generic drug: aspirin   furosemide 20 MG tablet Commonly known as: LASIX   oxyCODONE-acetaminophen 5-325 MG tablet Commonly known as: Percocet   potassium chloride SA 20 MEQ tablet Commonly known as: KLOR-CON     TAKE these medications   albuterol 108 (90 Base) MCG/ACT inhaler Commonly known as: VENTOLIN HFA Inhale 2 puffs into the lungs every 4 (four) hours as needed for wheezing or shortness of breath.   amiodarone 400 MG tablet Commonly known as: PACERONE Take 1 tablet (400 mg total) by mouth 2 (two) times daily for 5 days. What changed:  medication strength how much to take when to take this   amiodarone 200 MG tablet Commonly known as: Pacerone Take 1 tablet (200 mg total) by mouth 2 (two) times daily. Start taking on: Oct 08, 2020 What changed: You were already taking a medication with the same name, and this prescription was added. Make sure you understand how and when to take each.   apixaban 5 MG Tabs tablet Commonly known as: ELIQUIS Take 1 tablet (5 mg total) by mouth 2 (two) times daily.   atorvastatin 80 MG tablet Commonly known as: LIPITOR Take 80 mg by mouth at bedtime.   carvedilol 3.125 MG tablet Commonly known as: COREG Take 3.125 mg by mouth 2  (two) times daily.   citalopram 40 MG tablet Commonly known as: CELEXA Take 40 mg by mouth daily.   diclofenac Sodium 1 % Gel Commonly known as: VOLTAREN Apply 2 g topically 4 (four) times daily as needed.   docusate sodium 100 MG capsule Commonly known as: COLACE Take 100 mg by mouth at bedtime as needed.   esomeprazole 40 MG capsule Commonly known as: NEXIUM Take 40 mg by mouth daily.   ezetimibe 10 MG tablet Commonly known as: ZETIA Take 1 tablet (10 mg total) by mouth daily.   famotidine 40 MG tablet Commonly known as: PEPCID Take 40 mg by mouth daily.   fenofibrate 160 MG tablet Take 160 mg by mouth daily.   ferrous sulfate 325 (65 FE) MG tablet Take 325 mg by mouth daily.   gabapentin 600 MG tablet Commonly known as: NEURONTIN Take 600 mg by mouth 2 (two) times daily.   hydrOXYzine 50 MG capsule Commonly known as: VISTARIL Take 50 mg by mouth every 6 (six) hours as needed for anxiety.   ipratropium-albuterol 0.5-2.5 (3) MG/3ML Soln Commonly known as: DUONEB Take 3 mLs by nebulization 4 (four) times daily.   irbesartan 300 MG  tablet Commonly known as: Avapro Take 0.5 tablets (150 mg total) by mouth daily.   levothyroxine 75 MCG tablet Commonly known as: SYNTHROID Take 75 mcg by mouth daily before breakfast.   lidocaine 5 % Commonly known as: LIDODERM 3 patches daily.   Melatonin 10 MG Tabs Take 10 mg by mouth at bedtime as needed (sleep).   metFORMIN 500 MG tablet Commonly known as: GLUCOPHAGE Take 500 mg by mouth 2 (two) times daily.   Movantik 25 MG Tabs tablet Generic drug: naloxegol oxalate Take 25 mg by mouth daily.   nicotine 21 mg/24hr patch Commonly known as: NICODERM CQ - dosed in mg/24 hours Place 1 patch (21 mg total) onto the skin daily.   sitaGLIPtin 100 MG tablet Commonly known as: JANUVIA Take 100 mg by mouth daily.   Tiotropium Bromide Monohydrate 2.5 MCG/ACT Aers Inhale 2 puffs into the lungs daily.   Torsemide 40 MG  Tabs Take 40 mg by mouth daily. Start taking on: Oct 04, 2020   Vitamin D (Ergocalciferol) 1.25 MG (50000 UNIT) Caps capsule Commonly known as: DRISDOL Take 1 capsule by mouth once a week.       If you experience worsening of your admission symptoms, develop shortness of breath, life threatening emergency, suicidal or homicidal thoughts you must seek medical attention immediately by calling 911 or calling your MD immediately  if symptoms less severe.  You Must read complete instructions/literature along with all the possible adverse reactions/side effects for all the Medicines you take and that have been prescribed to you. Take any new Medicines after you have completely understood and accept all the possible adverse reactions/side effects.   Please note  You were cared for by a hospitalist during your hospital stay. If you have any questions about your discharge medications or the care you received while you were in the hospital after you are discharged, you can call the unit and asked to speak with the hospitalist on call if the hospitalist that took care of you is not available. Once you are discharged, your primary care physician will handle any further medical issues. Please note that NO REFILLS for any discharge medications will be authorized once you are discharged, as it is imperative that you return to your primary care physician (or establish a relationship with a primary care physician if you do not have one) for your aftercare needs so that they can reassess your need for medications and monitor your lab values.   DATA REVIEW:   CBC  Recent Labs  Lab 10/02/20 0341  WBC 9.6  HGB 11.0*  HCT 35.3*  PLT 283    Chemistries  Recent Labs  Lab 09/27/20 0313 09/28/20 0416 10/02/20 0341 10/02/20 1217  NA 131*   < >  --  139  K 5.4*   < > 4.2 3.9  CL 88*   < >  --  88*  CO2 33*   < >  --  39*  GLUCOSE 181*   < >  --  181*  BUN 56*   < >  --  48*  CREATININE 1.69*   < >   --  1.22*  CALCIUM 8.8*   < >  --  9.1  MG 2.1   < > 2.1  --   AST 27  --   --   --   ALT 24  --   --   --   ALKPHOS 44  --   --   --  BILITOT 0.6  --   --   --    < > = values in this interval not displayed.    Microbiology Results   Recent Results (from the past 240 hour(s))  Resp Panel by RT-PCR (Flu A&B, Covid) Nasopharyngeal Swab     Status: None   Collection Time: 09/25/20  9:57 PM   Specimen: Nasopharyngeal Swab; Nasopharyngeal(NP) swabs in vial transport medium  Result Value Ref Range Status   SARS Coronavirus 2 by RT PCR NEGATIVE NEGATIVE Final    Comment: (NOTE) SARS-CoV-2 target nucleic acids are NOT DETECTED.  The SARS-CoV-2 RNA is generally detectable in upper respiratory specimens during the acute phase of infection. The lowest concentration of SARS-CoV-2 viral copies this assay can detect is 138 copies/mL. A negative result does not preclude SARS-Cov-2 infection and should not be used as the sole basis for treatment or other patient management decisions. A negative result may occur with  improper specimen collection/handling, submission of specimen other than nasopharyngeal swab, presence of viral mutation(s) within the areas targeted by this assay, and inadequate number of viral copies(<138 copies/mL). A negative result must be combined with clinical observations, patient history, and epidemiological information. The expected result is Negative.  Fact Sheet for Patients:  EntrepreneurPulse.com.au  Fact Sheet for Healthcare Providers:  IncredibleEmployment.be  This test is no t yet approved or cleared by the [place] FDA and  has been authorized for detection and/or diagnosis of SARS-CoV-2 by FDA under an Emergency Use Authorization (EUA). This EUA will remain  in effect (meaning this test can be used) for the duration of the COVID-19 declaration under Section 564(b)(1) of the Act, 21 U.S.C.section 360bbb-3(b)(1),  unless the authorization is terminated  or revoked sooner.       Influenza A by PCR NEGATIVE NEGATIVE Final   Influenza B by PCR NEGATIVE NEGATIVE Final    Comment: (NOTE) The Xpert Xpress SARS-CoV-2/FLU/RSV plus assay is intended as an aid in the diagnosis of influenza from Nasopharyngeal swab specimens and should not be used as a sole basis for treatment. Nasal washings and aspirates are unacceptable for Xpert Xpress SARS-CoV-2/FLU/RSV testing.  Fact Sheet for Patients: EntrepreneurPulse.com.au  Fact Sheet for Healthcare Providers: IncredibleEmployment.be  This test is not yet approved or cleared by the [place] FDA and has been authorized for detection and/or diagnosis of SARS-CoV-2 by FDA under an Emergency Use Authorization (EUA). This EUA will remain in effect (meaning this test can be used) for the duration of the COVID-19 declaration under Section 564(b)(1) of the Act, 21 U.S.C. section 360bbb-3(b)(1), unless the authorization is terminated or revoked.  Performed at [facility], [address] [place], [place] [address]   MRSA PCR Screening     Status: None   Collection Time: 09/26/20  3:40 AM   Specimen: Nasopharyngeal  Result Value Ref Range Status   MRSA by PCR NEGATIVE NEGATIVE Final    Comment:        The GeneXpert MRSA Assay (FDA approved for NASAL specimens only), is one component of a comprehensive MRSA colonization surveillance program. It is not intended to diagnose MRSA infection nor to guide or monitor treatment for MRSA infections. Performed at [facility], [address], [place], [place] [address]     RADIOLOGY:  No results found.   CODE STATUS:     Code Status Orders  (From admission, onward)         Start     Ordered   10/03/20 [phone]  Full code  Continuous        10/03/20 0946        Code Status History    Date Active Date Inactive Code Status Order ID Comments  User Context   10/03/2020 0000 10/03/2020 0946 Partial Code [id number]  Sharion Settler, NP Inpatient   09/26/2020 1627 10/03/2020 0000 DNR [id number]  Asencion Gowda, NP Inpatient   09/26/2020 0041 09/26/2020 1627 Full Code [id number]  Athena Masse, MD ED   05/26/2020 1413 05/27/2020 1745 Full Code [id number]  Gwynne Edinger, MD ED   03/29/2020 0209 04/02/2020 1735 Full Code [id number]  Athena Masse, MD ED   07/04/2018 2312 07/07/2018 2001 Full Code [id number]  Salary, Avel Peace, MD ED   Advance Care Planning Activity    Advance Directive Documentation   Flowsheet Row Most Recent Value  Type of Advance Directive Healthcare Power of Attorney  Pre-existing out of facility DNR order (yellow form or pink MOST form) --  "MOST" Form in Place? --       TOTAL TIME TAKING CARE OF THIS PATIENT: *35 minutes.    Fritzi Mandes M.D  [facility]    CC: Primary care physician; [place]

## 2020-10-03 NOTE — Care Management Important Message (Signed)
Important Message  Patient Details  Name: Kristin [name] MRN: [id number] Date of Birth: 1950/10/04   Medicare Important Message Given:  Yes     Dannette Barbara 10/03/2020, 1:35 PM

## 2020-10-03 NOTE — Progress Notes (Signed)
Inpatient Diabetes Program Recommendations  AACE/ADA: New Consensus Statement on Inpatient Glycemic Control (2015)  Target Ranges:  Prepandial:   less than 140 mg/dL      Peak postprandial:   less than 180 mg/dL (1-2 hours)      Critically ill patients:  140 - 180 mg/dL   Lab Results  Component Value Date   GLUCAP 107 (H) 10/03/2020   HGBA1C 6.2 (H) 09/26/2020    Review of Glycemic Control Results for Kristin [name], Kristin [name] (MRN [id number]) as of 10/03/2020 09:15  Ref. Range 10/02/2020 07:25 10/02/2020 11:28 10/02/2020 16:22 10/02/2020 21:38 10/03/2020 07:32  Glucose-Capillary Latest Ref Range: 70 - 99 mg/dL 149 (H) 228 (H) 273 (H) 185 (H) 107 (H)   Diabetes history: DM 2 Outpatient Diabetes medications:  Metformin 500 mg bid, Januvia 100 mg daily Current orders for Inpatient glycemic control:  Novolog resistant tid with meals and HS Prednisone 40 mg daily Inpatient Diabetes Program Recommendations:    Blood sugars slightly greater than goal. Consider adding Novolog meal coverage 3 units tid with meals (hold if patient eats less than 50% or NPO).  Patient will not need insulin once steroids stopped.   Thanks,  Adah Perl, RN, BC-ADM Inpatient Diabetes Coordinator Pager [phone] (8a-5p)

## 2020-10-03 NOTE — Plan of Care (Signed)
  Problem: Activity: Goal: Risk for activity intolerance will decrease Outcome: Progressing   

## 2020-10-03 NOTE — Progress Notes (Signed)
Daily Progress Note   Patient Name: Kristin [name]       Date: 10/03/2020 DOB: [birth year]  Age: 70 y.o. MRN#: [id number] Attending Physician: Fritzi Mandes, MD Primary Care Physician: [place] Date: 09/25/2020  Reason for Consultation/Follow-up: Establishing goals of care Noted changes to code status overnight to partial.   Subjective: Patient is resting in bed. She states she continues to feel better. She states she has given it thought and "I am not ready to die. I can't give up." She states she would be willing to have a breathing tube and be placed on a ventilator, but she does not want a tracheostomy. She is aware her HPOA and family would need to make the decision not to place a tracheostomy when she is intubated. She states she would want CPR if her heart stops to "try". She states she wants all care possible at this time.   Her daughter Lynelle Smoke came in to conversation, and patient welcomed her into the conversation. Tammy states she will honor whatever her mother decides. Discussion of need for ongoing conversations. Code status changed to full code per her current wishes.   Recommend outpatient palliative.    Length of Stay: 8  Current Medications: Scheduled Meds:  . amiodarone  400 mg Oral BID  . apixaban  5 mg Oral BID  . atorvastatin  80 mg Oral Daily  . budesonide (PULMICORT) nebulizer solution  0.5 mg Nebulization BID  . carvedilol  3.125 mg Oral BID WC  . Chlorhexidine Gluconate Cloth  6 each Topical Daily  . citalopram  40 mg Oral Daily  . docusate sodium  100 mg Oral BID  . ezetimibe  10 mg Oral Daily  . famotidine  20 mg Oral Daily  . feeding supplement  237 mL Oral TID BM  . fenofibrate  160 mg Oral Daily  . ferrous sulfate  325 mg Oral Daily   . gabapentin  600 mg Oral BID  . insulin aspart  0-20 Units Subcutaneous TID WC  . insulin aspart  0-5 Units Subcutaneous QHS  . ipratropium-albuterol  3 mL Nebulization Q4H  . lactobacillus acidophilus  2 tablet Oral TID  . levothyroxine  75 mcg Oral Q0600  . mouth rinse  15 mL Mouth Rinse q12n4p  . multivitamin with minerals  1 tablet Oral Daily  . nicotine  21 mg Transdermal Daily  . polyethylene glycol  17 g Oral Daily  . predniSONE  40 mg Oral Q breakfast  . sodium chloride flush  3 mL Intravenous Q12H  . tiotropium  18 mcg Inhalation Daily  . torsemide  20 mg Oral Daily    Continuous Infusions: . sodium chloride 5 mL/hr at 10/01/20 0600    PRN Meds: sodium chloride, acetaminophen, albuterol, ALPRAZolam, guaiFENesin-dextromethorphan, ondansetron (ZOFRAN) IV, sodium chloride flush  Physical Exam Pulmonary:     Effort: Pulmonary effort is normal.  Neurological:     Mental Status: She is alert.             Vital Signs: BP (!) 111/50 (BP Location: Left Arm)   Pulse 60   Temp 98.5 F (36.9 C) (Oral)   Resp 17   Ht 5\' 2"  (1.575 m)   Wt 114.7 kg   LMP  (LMP Unknown)   SpO2 96%   BMI 46.26 kg/m  SpO2: SpO2: 96 % O2 Device: O2 Device: Nasal Cannula O2 Flow Rate: O2 Flow Rate (L/min): 2 L/min  Intake/output summary:   Intake/Output Summary (Last 24 hours) at 10/03/2020 0947 Last data filed at 10/03/2020 4270 Gross per 24 hour  Intake 940 ml  Output 605 ml  Net 335 ml   LBM: Last BM Date: 10/02/20 Baseline Weight: Weight: 122.7 kg Most recent weight: Weight: 114.7 kg        Flowsheet Rows   Flowsheet Row Most Recent Value  Intake Tab   Referral Department Critical care  Unit at Time of Referral ICU  Date Notified 09/26/20  Palliative Care Type New Palliative care  Reason for referral Clarify Goals of Care  Date of Admission 09/25/20  Date first seen by Palliative Care 09/26/20  # of days Palliative referral response time 0 Day(s)  # of days IP prior  to Palliative referral 1  Clinical Assessment   Psychosocial & Spiritual Assessment   Palliative Care Outcomes       Patient Active Problem List   Diagnosis Date Noted  . Aspiration into airway   . Paroxysmal atrial fibrillation (HCC)   . CAD (coronary artery disease) 09/26/2020  . Hyperkalemia 09/26/2020  . CHF exacerbation ([place]) 09/26/2020  . Acute on chronic respiratory failure with hypoxia and hypercapnia ([place]) 09/25/2020  . Bradycardia 09/25/2020  . Lung cancer ([place]) 05/26/2020  . Atrial flutter with rapid ventricular response ([place]) 05/26/2020  . Demand ischemia ([place])   . Acute on chronic heart failure with preserved ejection fraction (HFpEF) ([place])   . Aortic stenosis-moderate to severe 03/2020   . Obesity, Class III, BMI 40-49.9 (morbid obesity) ([place]) 03/29/2020  . Chronic respiratory failure with hypoxia ([place]) 03/29/2020  . OSA (obstructive sleep apnea) 03/29/2020  . Essential hypertension 03/29/2020  . Hypothyroidism 03/29/2020  . Chest pain 03/29/2020  . Elevated troponin 03/29/2020  . Chronic diastolic (congestive) heart failure ([place]) 07/12/2018  . Type 2 diabetes mellitus without complication ([place]) 62/37/6283  . COPD exacerbation ([place]) 07/12/2018  . Tobacco use 07/12/2018  . Hypotension 07/12/2018  . Respiratory failure ([place]) 07/04/2018    Palliative Care Assessment & Plan   Recommendations/Plan:  Code status changed to full code per current wishes.  Recommend palliative at D/C.    Code Status:    Code Status Orders  (From admission, onward)         Start     Ordered   10/03/20 0947  Full code  Continuous        10/03/20 0946        Code Status History    Date Active Date Inactive Code Status Order ID Comments User Context   10/03/2020 0000 10/03/2020 0946 Partial Code [id number]  Sharion Settler, NP Inpatient   09/26/2020 1627 10/03/2020 0000 DNR [id number]  Asencion Gowda, NP Inpatient   09/26/2020 0041 09/26/2020 1627 Full Code [id number]   Athena Masse, MD ED   05/26/2020 1413 05/27/2020 1745 Full Code [id number]  Gwynne Edinger, MD ED   03/29/2020 0209 04/02/2020 1735 Full Code [id number]  Athena Masse, MD ED   07/04/2018 2312 07/07/2018 2001 Full Code [id number]  Salary, Avel Peace, MD ED   Advance Care Planning Activity    Advance Directive Documentation   Flowsheet Row Most Recent Value  Type of Advance Directive Healthcare Power of Attorney  Pre-existing out of facility DNR order (yellow form or pink MOST form) --  "MOST" Form in Place? --      Prognosis: Poor overall  Care plan was discussed with primary MD and RN  Thank you for allowing the Palliative Medicine Team to assist in the care of this patient.   Total Time 60 min Prolonged Time Billed no      Greater than 50%  of this time was spent counseling and coordinating care related to the above assessment and plan.  Asencion Gowda, NP  Please contact Palliative Medicine Team phone at [phone] for questions and concerns.

## 2020-10-03 NOTE — TOC Progression Note (Signed)
Transition of Care [facility]) - Progression Note    Patient Details  Name: Kristin [name] MRN: [id number] Date of Birth: 1951-05-06  Transition of Care [facility]) CM/SW [place], [place] Phone Number: 10/03/2020, 10:23 AM  Clinical Narrative:     CSW spoke with patient regarding having one [place] vaccine. [place] inquiring if patient agreeable to receive second dose.   Patient reports she would be agreeable to take second Pfizer dose. Patient also updated on mild outbreak of COVID with 4 patients at facility.   CSW informed pending insurance auth she will transfer to [facility]. Patient reports no questions or concerns at this time.   Expected Discharge Plan: [place] Barriers to Discharge: Continued Medical Work up  Expected Discharge Plan and Services Expected Discharge Plan: [place] In-house Referral: Clinical Social Work     Living arrangements for the past 2 months: Single Family Home                                       Social Determinants of Health (SDOH) Interventions    Readmission Risk Interventions No flowsheet data found.

## 2020-10-03 NOTE — Progress Notes (Signed)
[facility] Nurse informs me that during nurse to nurse handoff, patient informs them that she does want to be intubated should she need it. Discussed with patient, with daughter present, who confirms she would agree to bipap or intubation should she need it, and she would want cardiac meds but no cardioversion. Orders changed in computer to reflect patient wishes

## 2020-10-03 NOTE — Progress Notes (Signed)
[facility] [place] NAME: Kristin [name]    MR#:  [id number]  DATE OF BIRTH:  02-Feb-1951  SUBJECTIVE:   Patient feels a lot better.  Good urine output. Breathing much better. On chronic oxygen. dter in the room REVIEW OF SYSTEMS:   Review of Systems  Constitutional: Negative for chills, fever and weight loss.  HENT: Negative for ear discharge, ear pain and nosebleeds.   Eyes: Negative for blurred vision, pain and discharge.  Respiratory: Negative for sputum production, shortness of breath, wheezing and stridor.   Cardiovascular: Negative for chest pain, palpitations, orthopnea and PND.  Gastrointestinal: Negative for abdominal pain, diarrhea, nausea and vomiting.  Genitourinary: Negative for frequency and urgency.  Musculoskeletal: Positive for joint pain. Negative for back pain.  Neurological: Positive for weakness. Negative for sensory change, speech change and focal weakness.  Psychiatric/Behavioral: Negative for depression and hallucinations. The patient is not nervous/anxious.    Tolerating Diet:yes Tolerating PT: rehab  DRUG ALLERGIES:   Allergies  Allergen Reactions  . Altace [Ramipril] Swelling    VITALS:  Blood pressure (!) 113/53, pulse 64, temperature 98.3 F (36.8 C), resp. rate 18, height 5\' 2"  (1.575 m), weight 114.7 kg, SpO2 97 %.  PHYSICAL EXAMINATION:   Physical Exam  GENERAL:  70 y.o.-year-old patient lying in the bed with no acute distress. Obeses LUNGS: decreased breath sounds bilaterally, no wheezing, rales, rhonchi. No use of accessory muscles of respiration.  CARDIOVASCULAR: S1, S2 normal. No murmurs, rubs, or gallops.  ABDOMEN: Soft, nontender, nondistended. Bowel sounds present. No organomegaly or mass. Abdominal obesity EXTREMITIES: No cyanosis, clubbing or edema b/l.   Right UE sling+ NEUROLOGIC: Cranial nerves II through XII are intact. No focal Motor or sensory deficits b/l.   PSYCHIATRIC:  patient  is alert and oriented x 3.  SKIN: No obvious rash, lesion, or ulcer.   LABORATORY PANEL:  CBC Recent Labs  Lab 10/02/20 0341  WBC 9.6  HGB 11.0*  HCT 35.3*  PLT 283    Chemistries  Recent Labs  Lab 09/27/20 0313 09/28/20 0416 10/02/20 0341 10/02/20 1217  NA 131*   < >  --  139  K 5.4*   < > 4.2 3.9  CL 88*   < >  --  88*  CO2 33*   < >  --  39*  GLUCOSE 181*   < >  --  181*  BUN 56*   < >  --  48*  CREATININE 1.69*   < >  --  1.22*  CALCIUM 8.8*   < >  --  9.1  MG 2.1   < > 2.1  --   AST 27  --   --   --   ALT 24  --   --   --   ALKPHOS 44  --   --   --   BILITOT 0.6  --   --   --    < > = values in this interval not displayed.   Cardiac Enzymes No results for input(s): TROPONINI in the last 168 hours. RADIOLOGY:  No results found. ASSESSMENT AND PLAN:  Kristin [name] is a chronically ill morbidly obese female with history of nonobstructive CAD, severe aortic stenosis being evaluated for TAVR, chronic diastolic CHF, advanced COPD/chronic respiratory failure on 3 L home O2, history of atrial flutter on amiodarone and apixaban, OSA not on CPAP, hypothyroidism, morbid obesity, right lower lobe lung cancer status post XRT,  diabetes, right humeral fracture (a week prior to admission ) presented to the ED on 5/12 with respiratory distress, she was noted to be tachypneic and treated with BiPAP briefly,  she was admitted to the ICU, chest x-ray was concerning for pulmonary vascular congestion.  Acute on chronic hypoxic respiratory failure secondary to acute on chronic diastolic congestive heart failure with severe aortic stenosis End-stage COPD with exacerbation Severe sleep apnea Suspected aspiration pneumonia -- patient overall improving. She is down to 3 L nasal cannula oxygen. Patient wears oxygen chronically at home. -- Switch to oral Augmentin will treat for 5 days -- PO prednisone taper -- continue bronchodilators and inhalers as needed -- IV Lasix 40 mg BID-- diuresis  well.--- Now on torsemide 20 mg daily --CPAP ordered for discharge  Paroxysmal atrial flutter/a fib with RVR with intermittent bradycardia -- Coreg on hold (soft BP) -- continue amiodarone. Continue eliquis  Severe AS --Dr Myrna Blazer is following as out pt for Possible TAVR  Acute metabolic encephalopathy -- multifactorial from above reasons -- resolved  AK I on CKD III -- creatinine improving.  Type II diabetes with CKD -- sugars stable continue sliding scale insulin for now  Non-small cell lung cancer with history of heavy tobacco abuse -- recently diagnosed and treated with radiation treatment in March 2022  Close fracture proximal end of right humerus 09/12/2020 with mechanical fall at home -- continue sling/immobilization and follow-up with orthopedic as per instruction  Hypothyroidism  --continue Synthroid  Morbid obesity with severe sleep apnea -- intermittent use of BiPAP -- continue oxygen  Procedures: Family communication :dter in the room Consults : palliative care, cardiology CODE STATUS: DNR DVT Prophylaxis : eliquis Level of care: Med-Surg Status is: Inpatient     Dispo: The patient is from: Home              Anticipated d/c is to: SNF              Patient currently is best optimized for discharge. Awaiting rehab bed. TOC for discharge planning.   Difficult to place patient No        TOTAL TIME TAKING CARE OF THIS PATIENT: 83minutes.  >50% time spent on counselling and coordination of care  Note: This dictation was prepared with Dragon dictation along with smaller phrase technology. Any transcriptional errors that result from this process are unintentional.  Kristin [name] M.D    [facility]   CC: Primary care physician; [facility], IncPatient ID: Kristin [name], female   DOB: 1950-07-24, 70 y.o.   MRN: [id number]

## 2020-10-03 NOTE — Discharge Instructions (Signed)
Pt will need to d/w PCP regarding Sleep study and possible CPAP

## 2020-10-03 NOTE — Progress Notes (Signed)
Nutrition Follow Up Note   DOCUMENTATION CODES:   Morbid obesity  INTERVENTION:   Ensure Enlive po TID, each supplement provides 350 kcal and 20 grams of protein  MVI po daily   NUTRITION DIAGNOSIS:   Increased nutrient needs related to chronic illness (COPD, lung cancer) as evidenced by estimated needs.  GOAL:   Patient will meet greater than or equal to 90% of their needs  -met   MONITOR:   PO intake,Supplement acceptance,Labs,Weight trends,Skin,I & O's  ASSESSMENT:   70 y.o. 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 Per chart, pt down ~18lbs since admit; pt remains ~ 22lbs above her UBW. Plan is for SNF at discharge.   Medications reviewed and include: celexa, colace, pepcid, ferrous sulfate, insulin, bacid, synthroid, MVI, nicotine, miralax, prednisone, torsemide  Labs reviewed: K 3.9 wnl, BUN 48(H), creat 1.22(H) Hgb 10.5(L), Hct 31.9(L) cbgs- 107, 170 x 24 hrs  Diet Order:    Diet Order            Diet Carb Modified Fluid consistency: Thin; Room service appropriate? Yes  Diet effective now                EDUCATION NEEDS:   Education needs have been addressed  Skin:  Skin Assessment: Reviewed RN Assessment (non pressure injury knee)  Last BM:  5/20- type 6  Height:   Ht Readings from Last 1 Encounters:  09/26/20 '5\' 2"'  (1.575 m)    Weight:   Wt Readings from Last 1 Encounters:  10/03/20 114.7 kg     Ideal Body Weight:  50 kg  BMI:  Body mass index is 46.26 kg/m.  Estimated Nutritional Needs:   Kcal:  2000-2300kcal/day  Protein:  100-115g/day  Fluid:  1.5L/day  Koleen Distance MS, RD, LDN Please refer to [facility] for RD and/or RD on-call/weekend/after hours pager

## 2020-10-03 NOTE — Progress Notes (Signed)
Progress Note  Patient Name: Kristin [name] Date of Encounter: 10/03/2020  [facility] Cardiologist: Ida Rogue, MD   Subjective   Feels well today, denies abdominal fullness, no shortness of breath lying supine on room air No leg edema Family at bedside, long discussion concerning patient's fluid intake They will help monitor that she does not have high fluid intake at home  Inpatient Medications    Scheduled Meds: . amiodarone  400 mg Oral BID  . apixaban  5 mg Oral BID  . atorvastatin  80 mg Oral Daily  . budesonide (PULMICORT) nebulizer solution  0.5 mg Nebulization BID  . carvedilol  3.125 mg Oral BID WC  . Chlorhexidine Gluconate Cloth  6 each Topical Daily  . citalopram  40 mg Oral Daily  . COVID-19 mRNA Vac-TriS ([employer])  0.3 mL Intramuscular Once  . docusate sodium  100 mg Oral BID  . ezetimibe  10 mg Oral Daily  . famotidine  20 mg Oral Daily  . feeding supplement  237 mL Oral TID BM  . fenofibrate  160 mg Oral Daily  . ferrous sulfate  325 mg Oral Daily  . gabapentin  600 mg Oral BID  . insulin aspart  0-20 Units Subcutaneous TID WC  . insulin aspart  0-5 Units Subcutaneous QHS  . insulin aspart  3 Units Subcutaneous TID WC  . ipratropium-albuterol  3 mL Nebulization Q4H  . lactobacillus acidophilus  2 tablet Oral TID  . levothyroxine  75 mcg Oral Q0600  . mouth rinse  15 mL Mouth Rinse q12n4p  . multivitamin with minerals  1 tablet Oral Daily  . nicotine  21 mg Transdermal Daily  . polyethylene glycol  17 g Oral Daily  . [START ON 10/04/2020] predniSONE  30 mg Oral Q breakfast  . sodium chloride flush  3 mL Intravenous Q12H  . tiotropium  18 mcg Inhalation Daily  . [START ON 10/04/2020] torsemide  40 mg Oral Daily   Continuous Infusions: . sodium chloride 5 mL/hr at 10/01/20 0600   PRN Meds: sodium chloride, acetaminophen, albuterol, ALPRAZolam, guaiFENesin-dextromethorphan, ondansetron (ZOFRAN) IV, sodium chloride flush   Vital Signs     Vitals:   10/03/20 0734 10/03/20 0755 10/03/20 1101 10/03/20 1137  BP: (!) 111/50   (!) 113/53  Pulse: 60   64  Resp: 17   18  Temp: 98.5 F (36.9 C)   98.3 F (36.8 C)  TempSrc: Oral     SpO2: 96% 96% 96% 97%  Weight:      Height:        Intake/Output Summary (Last 24 hours) at 10/03/2020 1458 Last data filed at 10/03/2020 1354 Gross per 24 hour  Intake 1070 ml  Output 1855 ml  Net -785 ml   Last 3 Weights 10/03/2020 10/01/2020 09/30/2020  Weight (lbs) 252 lb 14.4 oz 243 lb 9.7 oz 247 lb 2.2 oz  Weight (kg) 114.715 kg 110.5 kg 112.1 kg      Telemetry    Maintaining normal sinus rhythm no further episodes of atrial fibrillation- Personally Reviewed  ECG     - Personally Reviewed  Physical Exam   GEN: No acute distress.  Morbidly obese Neck: No JVD Cardiac: RRR, no murmurs, rubs, or gallops.  Respiratory: Clear to auscultation bilaterally. GI: Soft, nontender, non-distended  MS: No edema; No deformity. Neuro:  Nonfocal  Psych: Normal affect   Labs    High Sensitivity Troponin:   Recent Labs  Lab 09/25/20 2158 09/25/20 2357  09/27/20 0313 09/27/20 0502  TROPONINIHS 12 11 18* 19*      Chemistry Recent Labs  Lab 09/27/20 0313 09/28/20 0416 09/30/20 0519 10/01/20 0611 10/02/20 0341 10/02/20 1217  NA 131*   < > 139 139  --  139  K 5.4*   < > 4.9 3.9 4.2 3.9  CL 88*   < > 87* 88*  --  88*  CO2 33*   < > 43* 41*  --  39*  GLUCOSE 181*   < > 160* 93  --  181*  BUN 56*   < > 48* 45*  --  48*  CREATININE 1.69*   < > 1.09* 0.97  --  1.22*  CALCIUM 8.8*   < > 9.2 9.0  --  9.1  PROT 7.3  --   --   --   --   --   ALBUMIN 3.1*  --   --   --   --   --   AST 27  --   --   --   --   --   ALT 24  --   --   --   --   --   ALKPHOS 44  --   --   --   --   --   BILITOT 0.6  --   --   --   --   --   GFRNONAA 33*   < > 55* >60  --  48*  ANIONGAP 10   < > 9 10  --  12   < > = values in this interval not displayed.     Hematology Recent Labs  Lab 09/30/20 0519  10/01/20 0611 10/02/20 0341  WBC 8.6 10.9* 9.6  RBC 3.89 4.03 3.81*  HGB 11.5* 11.8* 11.0*  HCT 36.1 37.0 35.3*  MCV 92.8 91.8 92.7  MCH 29.6 29.3 28.9  MCHC 31.9 31.9 31.2  RDW 14.3 14.6 14.5  PLT 338 332 283    BNP Recent Labs  Lab 09/27/20 0313  BNP 1,737.3*     DDimer No results for input(s): DDIMER in the last 168 hours.   Radiology    No results found.  Cardiac Studies     Patient Profile     70 y.o. female with pmh of HFpEF, severe aortic calcification with stenosis and current TAVR/CTS evaluation, lung cancer, COPD, OSA, chronic respiratory failure on 3L O2, tobacco use, DM2, Paoroxysmal afib on amiodarone and ELiquis (no AV nodal blocking agents with post-termination pauses), mod to severe AS who presented with volume overload and SOB, diagnosed with possible PNA, beign seen for CHF and PAF.    Assessment & Plan    Acute on chronic diastolic CHF  -IV Lasix held as she has reached euvolemic state on clinical exam, unable to use Reds vest given her morbid obesity -We will transition to torsemide 40 mg daily with extra torsemide in the afternoon for 3 pound weight gain -Long discussion with patient and family at bedside concerning need to restrict fluids, avoid fast food, limit salt intake -Diet has been a major problem, contributing to her morbid obesity Sedentary, limited mobility -Stressed to family they need a new scale to help adjust her diuretics   Paroxysmal atrial fibrillation  1 hour episode 6 AM yesterday morning, none since then On Eliquis 5 twice daily, amiodarone 400 twice daily  5 days then down to 200 twice daily Coreg 3.125 twice daily    Moderate to severe aortic  valve stenosis  Followed by TAVR clinic  We will help arrange follow-up   Morbid obesity  Calorie restriction needed   Lifestyle modification recommended Sedentary lifestyle, weak legs, high fall risk  Long discussion with patient and with daughter at the bedside  concerning TAVR clinic follow-up, recovery, CHF management, need to restrict fluid and salt, daily weights, adjustment of diuretics  Total encounter time more than 35 minutes  Greater than 50% was spent in counseling and coordination of care with the patient    For questions or updates, please contact [place] Please consult [email or web address] for contact info under        Signed, Ida Rogue, MD  10/03/2020, 2:58 PM

## 2020-10-17 ENCOUNTER — Telehealth: Payer: Self-pay

## 2020-10-17 ENCOUNTER — Encounter: Payer: Self-pay | Admitting: Radiation Oncology

## 2020-10-17 NOTE — Progress Notes (Signed)
Our nursing team reported that patient had called in reporting hemoptysis.  I called the patient's home and spoke to her daughter, Kristin [name].  Kristin [name] reported that her mother has been coughing up clots of blood mixed with thick sputum.  The amount of blood and sputum are limited to about a cup per day total.   I let Kristin [name] know that if the bleeding escalates she should either call 911 or get her mother to the emergency room.  Otherwise I will notify Dr. Sondra Come so that he can determine what type of work-up is warranted next week.  -----------------------------------  [name], MD

## 2020-10-17 NOTE — Telephone Encounter (Signed)
Patient called in to report that she is coughing up a copious amount of blood. Patient denies any shortness of breath or other respiratory  symptoms. Patient completed radiation on 08/08/20 to left lung.

## 2020-10-24 ENCOUNTER — Other Ambulatory Visit: Payer: Self-pay

## 2020-10-24 ENCOUNTER — Ambulatory Visit (INDEPENDENT_AMBULATORY_CARE_PROVIDER_SITE_OTHER): Payer: 59

## 2020-10-24 DIAGNOSIS — I350 Nonrheumatic aortic (valve) stenosis: Secondary | ICD-10-CM

## 2020-10-24 LAB — ECHOCARDIOGRAM COMPLETE
AR max vel: 0.85 cm2
AV Area VTI: 0.91 cm2
AV Area mean vel: 0.86 cm2
AV Mean grad: 29 mmHg
AV Peak grad: 48.1 mmHg
Ao pk vel: 3.47 m/s
S' Lateral: 3.1 cm

## 2020-10-24 MED ORDER — PERFLUTREN LIPID MICROSPHERE
1.0000 mL | INTRAVENOUS | Status: AC | PRN
  Administered 2020-10-24: 2 mL via INTRAVENOUS

## 2020-10-29 ENCOUNTER — Emergency Department: Payer: 59

## 2020-10-29 ENCOUNTER — Other Ambulatory Visit: Payer: Self-pay

## 2020-10-29 ENCOUNTER — Emergency Department
Admission: EM | Admit: 2020-10-29 | Discharge: 2020-10-29 | Disposition: A | Discharge status: Home / Self Care | Payer: 59 | Attending: Emergency Medicine | Admitting: Emergency Medicine

## 2020-10-29 DIAGNOSIS — I509 Heart failure, unspecified: Secondary | ICD-10-CM

## 2020-10-29 DIAGNOSIS — Z79899 Other long term (current) drug therapy: Secondary | ICD-10-CM | POA: Insufficient documentation

## 2020-10-29 DIAGNOSIS — E039 Hypothyroidism, unspecified: Secondary | ICD-10-CM | POA: Diagnosis not present

## 2020-10-29 DIAGNOSIS — I2510 Atherosclerotic heart disease of native coronary artery without angina pectoris: Secondary | ICD-10-CM | POA: Diagnosis not present

## 2020-10-29 DIAGNOSIS — Z7901 Long term (current) use of anticoagulants: Secondary | ICD-10-CM | POA: Insufficient documentation

## 2020-10-29 DIAGNOSIS — Z7984 Long term (current) use of oral hypoglycemic drugs: Secondary | ICD-10-CM | POA: Insufficient documentation

## 2020-10-29 DIAGNOSIS — I110 Hypertensive heart disease with heart failure: Secondary | ICD-10-CM | POA: Diagnosis not present

## 2020-10-29 DIAGNOSIS — J9691 Respiratory failure, unspecified with hypoxia: Secondary | ICD-10-CM | POA: Insufficient documentation

## 2020-10-29 DIAGNOSIS — J45909 Unspecified asthma, uncomplicated: Secondary | ICD-10-CM | POA: Insufficient documentation

## 2020-10-29 DIAGNOSIS — E119 Type 2 diabetes mellitus without complications: Secondary | ICD-10-CM | POA: Insufficient documentation

## 2020-10-29 DIAGNOSIS — J9611 Chronic respiratory failure with hypoxia: Secondary | ICD-10-CM

## 2020-10-29 DIAGNOSIS — Z7951 Long term (current) use of inhaled steroids: Secondary | ICD-10-CM | POA: Diagnosis not present

## 2020-10-29 DIAGNOSIS — Z20822 Contact with and (suspected) exposure to covid-19: Secondary | ICD-10-CM | POA: Insufficient documentation

## 2020-10-29 DIAGNOSIS — I5032 Chronic diastolic (congestive) heart failure: Secondary | ICD-10-CM | POA: Insufficient documentation

## 2020-10-29 DIAGNOSIS — F17210 Nicotine dependence, cigarettes, uncomplicated: Secondary | ICD-10-CM | POA: Diagnosis not present

## 2020-10-29 DIAGNOSIS — J441 Chronic obstructive pulmonary disease with (acute) exacerbation: Secondary | ICD-10-CM | POA: Insufficient documentation

## 2020-10-29 DIAGNOSIS — R0602 Shortness of breath: Secondary | ICD-10-CM | POA: Diagnosis present

## 2020-10-29 LAB — COMPREHENSIVE METABOLIC PANEL
ALT: 16 U/L (ref 0–44)
AST: 26 U/L (ref 15–41)
Albumin: 3.5 g/dL (ref 3.5–5.0)
Alkaline Phosphatase: 41 U/L (ref 38–126)
Anion gap: 12 (ref 5–15)
BUN: 36 mg/dL — ABNORMAL HIGH (ref 8–23)
CO2: 40 mmol/L — ABNORMAL HIGH (ref 22–32)
Calcium: 9 mg/dL (ref 8.9–10.3)
Chloride: 89 mmol/L — ABNORMAL LOW (ref 98–111)
Creatinine, Ser: 1.53 mg/dL — ABNORMAL HIGH (ref 0.44–1.00)
GFR, Estimated: 37 mL/min — ABNORMAL LOW (ref >60)
Glucose, Bld: 104 mg/dL — ABNORMAL HIGH (ref 70–99)
Potassium: 5.1 mmol/L (ref 3.5–5.1)
Sodium: 141 mmol/L (ref 135–145)
Total Bilirubin: 0.9 mg/dL (ref 0.3–1.2)
Total Protein: 7 g/dL (ref 6.5–8.1)

## 2020-10-29 LAB — CBC WITH DIFFERENTIAL/PLATELET
Abs Immature Granulocytes: 0.04 10*3/uL (ref 0.00–0.07)
Basophils Absolute: 0 10*3/uL (ref 0.0–0.1)
Basophils Relative: 0 %
Eosinophils Absolute: 0.2 10*3/uL (ref 0.0–0.5)
Eosinophils Relative: 2 %
HCT: 31.4 % — ABNORMAL LOW (ref 36.0–46.0)
Hemoglobin: 9.5 g/dL — ABNORMAL LOW (ref 12.0–15.0)
Immature Granulocytes: 1 %
Lymphocytes Relative: 10 %
Lymphs Abs: 0.7 10*3/uL (ref 0.7–4.0)
MCH: 29.3 pg (ref 26.0–34.0)
MCHC: 30.3 g/dL (ref 30.0–36.0)
MCV: 96.9 fL (ref 80.0–100.0)
Monocytes Absolute: 0.7 10*3/uL (ref 0.1–1.0)
Monocytes Relative: 9 %
Neutro Abs: 5.8 10*3/uL (ref 1.7–7.7)
Neutrophils Relative %: 78 %
Platelets: 216 10*3/uL (ref 150–400)
RBC: 3.24 MIL/uL — ABNORMAL LOW (ref 3.87–5.11)
RDW: 15.5 % (ref 11.5–15.5)
WBC: 7.4 10*3/uL (ref 4.0–10.5)
nRBC: 0.3 % — ABNORMAL HIGH (ref 0.0–0.2)

## 2020-10-29 LAB — BRAIN NATRIURETIC PEPTIDE: B Natriuretic Peptide: 326.3 pg/mL — ABNORMAL HIGH (ref 0.0–100.0)

## 2020-10-29 LAB — RESP PANEL BY RT-PCR (FLU A&B, COVID) ARPGX2
Influenza A by PCR: NEGATIVE
Influenza B by PCR: NEGATIVE
SARS Coronavirus 2 by RT PCR: NEGATIVE

## 2020-10-29 MED ORDER — IPRATROPIUM-ALBUTEROL 0.5-2.5 (3) MG/3ML IN SOLN
3.0000 mL | Freq: Once | RESPIRATORY_TRACT | Status: AC
  Administered 2020-10-29: 3 mL via RESPIRATORY_TRACT
  Filled 2020-10-29: qty 3

## 2020-10-29 MED ORDER — METHYLPREDNISOLONE SODIUM SUCC 125 MG IJ SOLR
125.0000 mg | Freq: Once | INTRAMUSCULAR | Status: AC
  Administered 2020-10-29: 125 mg via INTRAVENOUS
  Filled 2020-10-29: qty 2

## 2020-10-29 MED ORDER — PREDNISONE 20 MG PO TABS: 40.0000 mg | ORAL_TABLET | Freq: Every day | ORAL | 0 refills | Status: AC

## 2020-10-29 MED ORDER — AMOXICILLIN-POT CLAVULANATE 875-125 MG PO TABS: 1.0000 | ORAL_TABLET | Freq: Two times a day (BID) | ORAL | 0 refills | Status: DC

## 2020-10-29 MED ORDER — ALBUTEROL SULFATE (2.5 MG/3ML) 0.083% IN NEBU
5.0000 mg | INHALATION_SOLUTION | Freq: Once | RESPIRATORY_TRACT | Status: AC
  Administered 2020-10-29: 5 mg via RESPIRATORY_TRACT
  Filled 2020-10-29: qty 6

## 2020-10-29 MED ORDER — GUAIFENESIN ER 600 MG PO TB12: 600.0000 mg | ORAL_TABLET | Freq: Two times a day (BID) | ORAL | 0 refills | Status: AC

## 2020-10-29 NOTE — ED Notes (Signed)
EDP at bedside.  Water and applesauce provided to pt.

## 2020-10-29 NOTE — ED Provider Notes (Signed)
[facility] Emergency Department Provider Note  ____________________________________________  Time seen: Approximately 1:51 PM  I have reviewed the triage vital signs and the nursing notes.   HISTORY  Chief Complaint Shortness of Breath    HPI Kristin [name] is a 70 y.o. female with a history of diabetes COPD CHF morbid obesity who comes ED complaining of shortness of breath that started today while walking to the bathroom.  Constant, waxing and waning, worse with walking, little bit easier with sitting and resting.  Associated with chest tightness which is nonradiating.  Also has nonproductive cough.  Denies fevers chills body aches, no vomiting or diarrhea.    Past Medical History:  Diagnosis Date   Anxiety    Asthma    CHF (congestive heart failure) ([place])    2018   COPD (chronic obstructive pulmonary disease) (HCC)    Diabetes mellitus without complication (HCC)    Dyspnea    GERD (gastroesophageal reflux disease)    History of hiatal hernia    History of orthopnea    History of radiation therapy 07/29/20-08/08/20   Right Lung, SBRT Dr. Gery Pray   Hypothyroidism    Neuropathy    Oxygen deficiency    3L/HS   Pain    BACK/DDD   Peripheral vascular disease (HCC)    RLS (restless legs syndrome)    Sleep apnea    Wheezing      Patient Active Problem List   Diagnosis Date Noted   Aspiration into airway    Paroxysmal atrial fibrillation ([place])    CAD (coronary artery disease) 09/26/2020   Hyperkalemia 09/26/2020   CHF exacerbation ([place]) 09/26/2020   Acute on chronic respiratory failure with hypoxia and hypercapnia ([place]) 09/25/2020   Bradycardia 09/25/2020   Lung cancer ([place]) 05/26/2020   Atrial flutter with rapid ventricular response ([place]) 05/26/2020   Demand ischemia ([place])    Acute on chronic heart failure with preserved ejection fraction (HFpEF) ([place])    Aortic stenosis-moderate to severe 03/2020    Obesity, Class III, BMI 40-49.9  (morbid obesity) ([place]) 03/29/2020   Chronic respiratory failure with hypoxia ([place]) 03/29/2020   OSA (obstructive sleep apnea) 03/29/2020   Essential hypertension 03/29/2020   Hypothyroidism 03/29/2020   Chest pain 03/29/2020   Elevated troponin 03/29/2020   Chronic diastolic (congestive) heart failure ([place]) 07/12/2018   Type 2 diabetes mellitus without complication ([place]) 96/28/3662   COPD exacerbation ([place]) 07/12/2018   Tobacco use 07/12/2018   Hypotension 07/12/2018   Respiratory failure ([place]) 07/04/2018     Past Surgical History:  Procedure Laterality Date   BREAST SURGERY     CATARACT EXTRACTION W/PHACO Right 03/02/2018   Procedure: CATARACT EXTRACTION PHACO AND INTRAOCULAR LENS PLACEMENT ([place]);  Surgeon: Marchia Meiers, MD;  Location: [facility] ORS;  Service: Ophthalmology;  Laterality: Right;  [place] 01:15 CDE 16.46 Fluid pack lot # [id number]   CATARACT EXTRACTION W/PHACO Left 04/27/2018   Procedure: CATARACT EXTRACTION PHACO AND INTRAOCULAR LENS PLACEMENT ([place])- LEFT DIABETIC;  Surgeon: Marchia Meiers, MD;  Location: [facility] ORS;  Service: Ophthalmology;  Laterality: Left;  Lot # [id number] [place]: 00:46.3 CDE: 8.07    CYST EXCISION     FOREHEAD   FOOT SURGERY     CYST   RIGHT/LEFT HEART CATH AND CORONARY ANGIOGRAPHY N/A 03/31/2020   Procedure: RIGHT/LEFT HEART CATH AND CORONARY ANGIOGRAPHY;  Surgeon: Wellington Hampshire, MD;  Location: [facility];  Service: Cardiovascular;  Laterality: N/A;   TUBAL LIGATION  Prior to Admission medications   Medication Sig Start Date End Date Taking? Authorizing Provider  amoxicillin-clavulanate (AUGMENTIN) 875-125 MG tablet Take 1 tablet by mouth 2 (two) times daily. 10/29/20  Yes Carrie Mew, MD  guaiFENesin (MUCINEX) 600 MG 12 hr tablet Take 1 tablet (600 mg total) by mouth 2 (two) times daily for 15 days. 10/29/20 11/13/20 Yes Carrie Mew, MD  predniSONE (DELTASONE) 20 MG tablet Take 2 tablets (40 mg total) by mouth daily with  breakfast for 4 days. 10/29/20 11/02/20 Yes Carrie Mew, MD  albuterol (VENTOLIN HFA) 108 (90 Base) MCG/ACT inhaler Inhale 2 puffs into the lungs every 4 (four) hours as needed for wheezing or shortness of breath.     [provider]  amiodarone (PACERONE) 200 MG tablet Take 1 tablet (200 mg total) by mouth 2 (two) times daily. 10/08/20   Fritzi Mandes, MD  amiodarone (PACERONE) 400 MG tablet Take 1 tablet (400 mg total) by mouth 2 (two) times daily for 5 days. 10/03/20 10/08/20  Fritzi Mandes, MD  apixaban (ELIQUIS) 5 MG TABS tablet Take 1 tablet (5 mg total) by mouth 2 (two) times daily. 05/29/20   Dunn, Areta Haber, PA-C  atorvastatin (LIPITOR) 80 MG tablet Take 80 mg by mouth at bedtime.  09/05/19   [provider]  carvedilol (COREG) 3.125 MG tablet Take 3.125 mg by mouth 2 (two) times daily. 06/14/20   [provider]  citalopram (CELEXA) 40 MG tablet Take 40 mg by mouth daily. 06/14/20   [provider]  diclofenac Sodium (VOLTAREN) 1 % GEL Apply 2 g topically 4 (four) times daily as needed.    [provider]  docusate sodium (COLACE) 100 MG capsule Take 100 mg by mouth at bedtime as needed. 03/19/20   [provider]  esomeprazole (NEXIUM) 40 MG capsule Take 40 mg by mouth daily. 10/01/19   [provider]  ezetimibe (ZETIA) 10 MG tablet Take 1 tablet (10 mg total) by mouth daily. 06/18/20 06/13/21  Marrianne Mood D, PA-C  famotidine (PEPCID) 40 MG tablet Take 40 mg by mouth daily.    [provider]  fenofibrate 160 MG tablet Take 160 mg by mouth daily.    [provider]  ferrous sulfate 325 (65 FE) MG tablet Take 325 mg by mouth daily. 06/14/20   [provider]  gabapentin (NEURONTIN) 600 MG tablet Take 600 mg by mouth 2 (two) times daily.  11/20/19   [provider]  hydrOXYzine (VISTARIL) 50 MG capsule Take 50 mg by mouth every 6 (six) hours as needed for anxiety. 03/14/20   [provider]   ipratropium-albuterol (DUONEB) 0.5-2.5 (3) MG/3ML SOLN Take 3 mLs by nebulization 4 (four) times daily. 08/07/20   [provider]  irbesartan (AVAPRO) 300 MG tablet Take 0.5 tablets (150 mg total) by mouth daily. 06/30/20 06/25/21  Loel Dubonnet, NP  levothyroxine (SYNTHROID, LEVOTHROID) 75 MCG tablet Take 75 mcg by mouth daily before breakfast.    [provider]  lidocaine (LIDODERM) 5 % 3 patches daily. 05/28/20   [provider]  Melatonin 10 MG TABS Take 10 mg by mouth at bedtime as needed (sleep).    [provider]  metFORMIN (GLUCOPHAGE) 500 MG tablet Take 500 mg by mouth 2 (two) times daily. 09/19/19   [provider]  MOVANTIK 25 MG TABS tablet Take 25 mg by mouth daily. 07/21/20   [provider]  nicotine (NICODERM CQ - DOSED IN MG/24 HOURS) 21 mg/24hr  patch Place 1 patch (21 mg total) onto the skin daily. 08/15/20   Marrianne Mood D, PA-C  sitaGLIPtin (JANUVIA) 100 MG tablet Take 100 mg by mouth daily.    [provider]  Tiotropium Bromide Monohydrate 2.5 MCG/ACT AERS Inhale 2 puffs into the lungs daily.     [provider]  torsemide 40 MG TABS Take 40 mg by mouth daily. 10/04/20   Fritzi Mandes, MD  Vitamin D, Ergocalciferol, (DRISDOL) 1.25 MG (50000 UNIT) CAPS capsule Take 1 capsule by mouth once a week. 08/14/20   [provider]     Allergies Altace [ramipril]   Family History  Problem Relation Age of Onset   Heart disease Mother    Cancer Father     Social History Social History   Tobacco Use   Smoking status: Every Day    Packs/day: 1.00    Years: 50.00    Pack years: 50.00    Types: Cigarettes   Smokeless tobacco: Never  Vaping Use   Vaping Use: Never used  Substance Use Topics   Alcohol use: Not Currently    Review of Systems  Constitutional:   No fever or chills.  ENT:   No sore throat. No rhinorrhea. Cardiovascular:   Chest tightness as above without   syncope. Respiratory:   Positive shortness of breath and nonproductive cough. Gastrointestinal:   Negative for abdominal pain, vomiting and diarrhea.  Musculoskeletal:   Negative for focal pain or swelling All other systems reviewed and are negative except as documented above in ROS and HPI.  ____________________________________________   PHYSICAL EXAM:  VITAL SIGNS: ED Triage Vitals  Enc Vitals Group     BP 10/29/20 1327 129/60     Pulse Rate 10/29/20 1327 78     Resp 10/29/20 1327 19     Temp --      Temp src --      SpO2 10/29/20 1327 90 %     Weight 10/29/20 1328 245 lb (111.1 kg)     Height 10/29/20 1328 5\' 2"  (1.575 m)     Head Circumference --      Peak Flow --      Pain Score 10/29/20 1328 0     Pain Loc --      Pain Edu? --      Excl. in [place]? --     Vital signs reviewed, nursing assessments reviewed.   Constitutional:   Alert and oriented. Non-toxic appearance. Eyes:   Conjunctivae are normal. EOMI. PERRL. ENT      Head:   Normocephalic and atraumatic.      Nose:   Wearing a mask.      Mouth/Throat:   Wearing a mask.      Neck:   No meningismus. Full ROM. Hematological/Lymphatic/Immunilogical:   No cervical lymphadenopathy. Cardiovascular:   RRR. Symmetric bilateral radial and DP pulses.  No murmurs. Cap refill less than 2 seconds. Respiratory:   Normal respiratory effort without tachypnea/retractions.  Diffuse expiratory wheezing and prolonged expiratory phase, accentuated by FEV1 maneuver.  Oxygenation 94% on her chronic 3 L nasal cannula. Gastrointestinal:   Soft and nontender. Non distended. There is no CVA tenderness.  No rebound, rigidity, or guarding. Genitourinary:   deferred Musculoskeletal:   Normal range of motion in all extremities. No joint effusions.  No lower extremity tenderness.  1+ pitting edema bilateral lower extremities. Neurologic:   Normal speech and language.  Motor grossly intact. No acute focal neurologic deficits are appreciated.  Skin:    Skin is warm, dry and intact. No rash noted.  No petechiae, purpura, or bullae.  ____________________________________________    LABS (pertinent positives/negatives) (all labs ordered are listed, but only abnormal results are displayed) Labs Reviewed  COMPREHENSIVE METABOLIC PANEL - Abnormal; Notable for the following components:      Result Value   Chloride 89 (*)    CO2 40 (*)    Glucose, Bld 104 (*)    BUN 36 (*)    Creatinine, Ser 1.53 (*)    GFR, Estimated 37 (*)    All other components within normal limits  CBC WITH DIFFERENTIAL/PLATELET - Abnormal; Notable for the following components:   RBC 3.24 (*)    Hemoglobin 9.5 (*)    HCT 31.4 (*)    nRBC 0.3 (*)    All other components within normal limits  BRAIN NATRIURETIC PEPTIDE - Abnormal; Notable for the following components:   B Natriuretic Peptide 326.3 (*)    All other components within normal limits  RESP PANEL BY RT-PCR (FLU A&B, COVID) [id number]   ____________________________________________   EKG Interpreted by me Normal sinus rhythm rate of 75, normal axis and intervals.  Poor R wave progression.  Normal ST segments and T waves   ____________________________________________    RADIOLOGY  DG Chest Portable 1 View  Result Date: 10/29/2020 CLINICAL DATA:  Short of breath and wheezing EXAM: PORTABLE CHEST 1 VIEW COMPARISON:  09/27/2020 FINDINGS: Cardiac enlargement with vascular congestion. Improvement in bilateral airspace disease most likely improvement in edema. No pleural effusion. IMPRESSION: Improvement in bilateral airspace disease. Findings most compatible with improving heart failure and edema. Electronically Signed   By: Franchot Gallo M.D.   On: 10/29/2020 14:30    ____________________________________________   PROCEDURES Procedures  ____________________________________________  DIFFERENTIAL DIAGNOSIS   COPD exacerbation, CHF exacerbation, pneumonia, pleural effusion, pneumothorax,  viral illness.  Doubt PE, dissection, pericarditis, ACS  CLINICAL IMPRESSION / ASSESSMENT AND PLAN / ED COURSE  Medications ordered in the ED: Medications  methylPREDNISolone sodium succinate (SOLU-MEDROL) 125 mg/2 mL injection 125 mg (125 mg Intravenous Given 10/29/20 1347)  ipratropium-albuterol (DUONEB) 0.5-2.5 (3) MG/3ML nebulizer solution 3 mL (3 mLs Nebulization Given 10/29/20 1347)  albuterol (PROVENTIL) (2.5 MG/3ML) 0.083% nebulizer solution 5 mg (5 mg Nebulization Given 10/29/20 1347)    Pertinent labs & imaging results that were available during my care of the patient were reviewed by me and considered in my medical decision making (see chart for details).  Kristin [name] was evaluated in Emergency Department on 10/29/2020 for the symptoms described in the history of present illness. She was evaluated in the context of the global COVID-19 pandemic, which necessitated consideration that the patient might be at risk for infection with the SARS-CoV-2 virus that causes COVID-19. Institutional protocols and algorithms that pertain to the evaluation of patients at risk for COVID-19 are in a state of rapid change based on information released by regulatory bodies including the CDC and federal and state organizations. These policies and algorithms were followed during the patient's care in the ED.   Patient presents with shortness of breath and cough, most suspicious for COPD exacerbation based on exam.  Will give Solu-Medrol and bronchodilators, check labs and chest x-ray.   ----------------------------------------- 3:25 PM on 10/29/2020 ----------------------------------------- Feeling much better.  Wheezing resolved.  Oxygenation remained stable on her baseline nasal cannula.  Expiratory phase has normalized with FEV1 maneuver.  Stable for discharge     ____________________________________________   FINAL CLINICAL  IMPRESSION(S) / ED DIAGNOSES    Final diagnoses:  COPD exacerbation  ([place])  Chronic congestive heart failure, unspecified heart failure type ([place])  Chronic respiratory failure with hypoxia (HCC)  Morbid obesity [facility])     ED Discharge Orders          Ordered    amoxicillin-clavulanate (AUGMENTIN) 875-125 MG tablet  2 times daily        10/29/20 1524    predniSONE (DELTASONE) 20 MG tablet  Daily with breakfast        10/29/20 1524    guaiFENesin (MUCINEX) 600 MG 12 hr tablet  2 times daily        10/29/20 1524            Portions of this note were generated with dragon dictation software. Dictation errors may occur despite best attempts at proofreading.   Carrie Mew, MD 10/29/20 1526

## 2020-10-29 NOTE — ED Triage Notes (Addendum)
Pt to ED from home for dyspnea with exertion  that started today while walking to bathroom.   Pt has hx CHF, a fib, HTN, DM, and COPD. Pt was 1.5 pack/day smoker for 50+ years and recently quit. Pt wears 3L oxygen at home.  EMS reports that pt had chest discomfort upon arrival and SPO2 was 82% on 4L oxygen. They put her on 15L and chest discomfort resolved and SPO2 resolved to 100%.  Pt currently on 4L and is 92%.  Pt has R arm in sling from R arm/shoulder fracture about3 wk ago.   EDP has seen pt at bedside.

## 2020-10-29 NOTE — ED Notes (Signed)
Called daughter [name] per pt request to inform pt being discharged and will need to be picked up. She will be on her way to pick up pt.

## 2020-11-02 NOTE — Progress Notes (Signed)
Cardiology Office Note  Date:  11/03/2020   ID:  Kristin [name], DOB 04-Feb-1951, MRN [id number]  PCP:  [facility]   Chief Complaint  Patient presents with   2 month follow up     Patient c/o shortness of breath, cramping in legs at times, chest pain at times when is off oxygen and has some LE edema. Medications reviewed by the patient verbally.     HPI:  70 y.o. 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, diastolic CHF, severe aortic calcification with stenosis and current TAVR/CTS evaluation, PAF with post-termination pauses and recommendation to avoid AV nodal blocking agents on amiodarone and OAC, hypertension, chronic respiratory failure on home O2 at 3 L nasal cannula oxygen (5L as of 4/2), current tobacco use, COPD, OSA, hypothyroidism, DM2, Presents for Moderate aortic valve stenosis  Still smoking Does not understand why she needs to quit smoking Friend/family who presents with her today insisting she needs to stop  Seen in the ER for SOB COPD: exacerbation Treated with prednisone, antibiotics, Mucinex Feels stable today  Sedentary at home, broken right arm, on oxygen Needing assistance for ADLs  Reports she has completed radiation for her lung cancer  Denies any tachypalpitations concerning for breakthrough arrhythmia  Recent lab work reviewed from the hospital, climbing creatinine 1.5 Has been taking torsemide 40 daily Denies any leg swelling  EKG personally reviewed by myself on todays visit NSR with PAC rate 67 bpm  Other past medical history reviewed admission 07/04/2018 acute COPD CHF, successful IV diuresis.  03/2020 in [facility] ED for NSTEMI.   Echo showed nl EF, mild LVH, G1DD, mild LAE, and moderate to severe AS.   R/LHC. moderate nonobstructive CAD with stenosis being 60% of pLAD.  Right heart cath moderate PHTN, moderately reduced CO.  Severe aortic stenosis noted with mean gradient 65mmHg and calcified valve 0.7cm2.    TAVR workup with Cardiac CTA noted a 1.9x1.4x1.6cm mass in super segmet of RLL suspicious for lung CA.   Subsequent PET scan showed 2.1x1.4cm RLL nodule that was hypermetabolic and compatible with malignancy with no appreciated nodal or mets.    outpatient MRI of the brain to ensure no METs to the brain from her lung cancer.   in the MRI machine developed CP, abdominal pain, and emesis.  noted to be in atrial flutter then converted to a junctional rhythm with 4.1 second termination pauses. Coreg was discontinued 2/2 termination pauses  ambulatory monitoring  recurrent Afib noted  Eliquis 5mg  BID start. on amiodarone.    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  MRI of the brain was fortunately without intracranial mets.  CTS recommendation was for workup/tx of SCC per oncology.    still smoking  1.5 packs/day to 1 pack/day.       PMH:   has a past medical history of Anxiety, Asthma, CHF (congestive heart failure) ([place]), COPD (chronic obstructive pulmonary disease) ([place]), Diabetes mellitus without complication ([place]), Dyspnea, GERD (gastroesophageal reflux disease), History of hiatal hernia, History of orthopnea, History of radiation therapy (07/29/20-08/08/20), Hypothyroidism, Neuropathy, Oxygen deficiency, Pain, Peripheral vascular disease ([place]), RLS (restless legs syndrome), Sleep apnea, and Wheezing.  PSH:    Past Surgical History:  Procedure Laterality Date   BREAST SURGERY     CATARACT EXTRACTION W/PHACO Right 03/02/2018   Procedure: CATARACT EXTRACTION PHACO AND INTRAOCULAR LENS PLACEMENT ([place]);  Surgeon: Marchia Meiers, MD;  Location: [facility] ORS;  Service: Ophthalmology;  Laterality:  Right;  [place] 01:15 CDE 16.46 Fluid pack lot # [id number]   CATARACT EXTRACTION W/PHACO Left 04/27/2018   Procedure: CATARACT EXTRACTION PHACO AND INTRAOCULAR LENS PLACEMENT ([place])- LEFT DIABETIC;  Surgeon: Marchia Meiers, MD;  Location: [facility] ORS;  Service: Ophthalmology;  Laterality: Left;  Lot #  [id number] [place]: 00:46.3 CDE: 8.07    CYST EXCISION     FOREHEAD   FOOT SURGERY     CYST   RIGHT/LEFT HEART CATH AND CORONARY ANGIOGRAPHY N/A 03/31/2020   Procedure: RIGHT/LEFT HEART CATH AND CORONARY ANGIOGRAPHY;  Surgeon: Wellington Hampshire, MD;  Location: [place] [facility];  Service: Cardiovascular;  Laterality: N/A;   TUBAL LIGATION      Current Outpatient Medications  Medication Sig Dispense Refill   albuterol (VENTOLIN HFA) 108 (90 Base) MCG/ACT inhaler Inhale 2 puffs into the lungs every 4 (four) hours as needed for wheezing or shortness of breath.      amiodarone (PACERONE) 200 MG tablet Take 200 mg by mouth 2 (two) times daily.     apixaban (ELIQUIS) 5 MG TABS tablet Take 1 tablet (5 mg total) by mouth 2 (two) times daily. 60 tablet 5   atorvastatin (LIPITOR) 80 MG tablet Take 80 mg by mouth at bedtime.      carvedilol (COREG) 3.125 MG tablet Take 3.125 mg by mouth 2 (two) times daily.     citalopram (CELEXA) 40 MG tablet Take 40 mg by mouth daily.     diclofenac Sodium (VOLTAREN) 1 % GEL Apply 2 g topically 4 (four) times daily as needed.     docusate sodium (COLACE) 100 MG capsule Take 100 mg by mouth at bedtime as needed.     esomeprazole (NEXIUM) 40 MG capsule Take 40 mg by mouth daily.     ezetimibe (ZETIA) 10 MG tablet Take 1 tablet (10 mg total) by mouth daily. 90 tablet 3   famotidine (PEPCID) 40 MG tablet Take 40 mg by mouth daily.     fenofibrate 160 MG tablet Take 160 mg by mouth daily.     ferrous sulfate 325 (65 FE) MG tablet Take 325 mg by mouth daily.     gabapentin (NEURONTIN) 600 MG tablet Take 600 mg by mouth 2 (two) times daily.      guaiFENesin (MUCINEX) 600 MG 12 hr tablet Take 1 tablet (600 mg total) by mouth 2 (two) times daily for 15 days. 30 tablet 0   hydrOXYzine (VISTARIL) 50 MG capsule Take 50 mg by mouth every 6 (six) hours as needed for anxiety.     ipratropium-albuterol (DUONEB) 0.5-2.5 (3) MG/3ML SOLN Take 3 mLs by nebulization 4 (four) times  daily.     irbesartan (AVAPRO) 300 MG tablet Take 0.5 tablets (150 mg total) by mouth daily. 90 tablet 3   levothyroxine (SYNTHROID) 100 MCG tablet Take by mouth.     lidocaine (LIDODERM) 5 % 3 patches daily.     lidocaine (LIDODERM) 5 % Apply topically.     losartan (COZAAR) 25 MG tablet Take 25 mg by mouth daily.     Melatonin 10 MG TABS Take 10 mg by mouth at bedtime as needed (sleep).     metFORMIN (GLUCOPHAGE) 500 MG tablet Take 500 mg by mouth 2 (two) times daily.     MOVANTIK 25 MG TABS tablet Take 25 mg by mouth daily.     nicotine (NICODERM CQ - DOSED IN MG/24 HOURS) 21 mg/24hr patch Place 1 patch (21 mg total) onto the skin daily. 28 patch  5   oxyCODONE-acetaminophen (PERCOCET) 10-325 MG tablet Take 1 tablet by mouth 4 (four) times daily as needed.     sitaGLIPtin (JANUVIA) 100 MG tablet Take 100 mg by mouth daily.     Tiotropium Bromide Monohydrate 2.5 MCG/ACT AERS Inhale 2 puffs into the lungs daily.      torsemide 40 MG TABS Take 40 mg by mouth daily. 30 tablet 0   Vitamin D, Ergocalciferol, (DRISDOL) 1.25 MG (50000 UNIT) CAPS capsule Take 1 capsule by mouth once a week.     No current facility-administered medications for this visit.     Allergies:   Altace [ramipril]   Social History:  The patient  reports that she has quit smoking. Her smoking use included cigarettes. She has a 50.00 pack-year smoking history. She has never used smokeless tobacco. She reports previous alcohol use.   Family History:   family history includes Cancer in her father; Heart disease in her mother.    Review of Systems: Review of Systems  Constitutional: Negative.   Respiratory:  Positive for shortness of breath and wheezing.   Cardiovascular: Negative.   Gastrointestinal: Negative.   Musculoskeletal: Negative.   Neurological: Negative.   Psychiatric/Behavioral: Negative.    All other systems reviewed and are negative.   PHYSICAL EXAM: VS:  BP (!) 118/48 (BP Location: Left Arm, Patient  Position: Sitting, Cuff Size: Normal)   Pulse 67   Ht 5\' 2"  (1.575 m)   Wt 245 lb 4 oz (111.2 kg)   LMP  (LMP Unknown)   SpO2 96% Comment: 3 Liters of oxygen  BMI 44.86 kg/m  , BMI Body mass index is 44.86 kg/m. Constitutional:  oriented to person, place, and time. No distress.  HENT:  Head: Grossly normal Eyes:  no discharge. No scleral icterus.  Neck: No JVD, no carotid bruits  Cardiovascular: Regular rate and rhythm, no murmurs appreciated Pulmonary/Chest: Clear to auscultation bilaterally, no wheezes or rails Abdominal: Soft.  no distension.  no tenderness.  Musculoskeletal: Normal range of motion Neurological:  normal muscle tone. Coordination normal. No atrophy Skin: Skin warm and dry Psychiatric: normal affect, pleasant  Recent Labs: 05/26/2020: TSH 1.133 10/02/2020: Magnesium 2.1 10/29/2020: ALT 16; B Natriuretic Peptide 326.3; BUN 36; Creatinine, Ser 1.53; Hemoglobin 9.5; Platelets 216; Potassium 5.1; Sodium 141    Lipid Panel Lab Results  Component Value Date   CHOL 165 06/27/2020   HDL 61 06/27/2020   LDLCALC 91 06/27/2020   TRIG 64 06/27/2020      Wt Readings from Last 3 Encounters:  11/03/20 245 lb 4 oz (111.2 kg)  10/29/20 245 lb (111.1 kg)  10/03/20 252 lb 14.4 oz (114.7 kg)      ASSESSMENT AND PLAN:  Problem List Items Addressed This Visit       Cardiology Problems   Paroxysmal atrial fibrillation (HCC)   Relevant Medications   amiodarone (PACERONE) 200 MG tablet   losartan (COZAAR) 25 MG tablet   CAD (coronary artery disease)   Relevant Medications   amiodarone (PACERONE) 200 MG tablet   losartan (COZAAR) 25 MG tablet   Essential hypertension   Relevant Medications   amiodarone (PACERONE) 200 MG tablet   losartan (COZAAR) 25 MG tablet   Chronic diastolic (congestive) heart failure (HCC)   Relevant Medications   amiodarone (PACERONE) 200 MG tablet   losartan (COZAAR) 25 MG tablet   Other Visit Diagnoses     Moderate to severe aortic  stenosis    -  Primary  Relevant Medications   amiodarone (PACERONE) 200 MG tablet   losartan (COZAAR) 25 MG tablet   Other Relevant Orders   EKG 12-Lead   Acute on chronic diastolic (congestive) heart failure (HCC)       Relevant Medications   amiodarone (PACERONE) 200 MG tablet   losartan (COZAAR) 25 MG tablet   Other Relevant Orders   EKG 12-Lead   Hyperlipidemia, unspecified hyperlipidemia type       Relevant Medications   amiodarone (PACERONE) 200 MG tablet   losartan (COZAAR) 25 MG tablet       COPD exacerbation Strongly recommend she stop smoking On nebulizers, inhalers, will refer to pulmonary to see if there are additional changes she could make with her inhalers  Lung cancer Completed radiation Has follow-up with oncology  Aortic valve stenosis Very sedentary, limited secondary to COPD Monitored by [facility] clinic in [facility] Echocardiogram June 2022 reviewed  Diastolic CHF Recommend torsemide 20 alternating with 40 down from 40 daily Does not need potassium  Atrial fibrillation, paroxysmal Continue carvedilol, decrease amiodarone down to 200 mg daily, continue Eliquis 5 twice daily Last TSH January 2022, will arrange follow-up TSH LFTs normal June 0488  Very complicated medical history, recent hospitalizations reviewed with her in detail  Total encounter time more than 35 minutes  Greater than 50% was spent in counseling and coordination of care with the patient    Signed, Esmond Plants, M.D., Ph.D. [place], [place]

## 2020-11-03 ENCOUNTER — Other Ambulatory Visit: Payer: Self-pay

## 2020-11-03 ENCOUNTER — Ambulatory Visit (INDEPENDENT_AMBULATORY_CARE_PROVIDER_SITE_OTHER): Payer: 59 | Admitting: Cardiovascular Disease

## 2020-11-03 ENCOUNTER — Encounter: Payer: Self-pay | Admitting: Cardiovascular Disease

## 2020-11-03 ENCOUNTER — Telehealth: Payer: Self-pay | Admitting: Cardiovascular Disease

## 2020-11-03 VITALS — BP 118/48 | HR 67 | Ht 62.0 in | Wt 245.2 lb

## 2020-11-03 DIAGNOSIS — I350 Nonrheumatic aortic (valve) stenosis: Secondary | ICD-10-CM

## 2020-11-03 DIAGNOSIS — I480 Paroxysmal atrial fibrillation: Secondary | ICD-10-CM | POA: Diagnosis not present

## 2020-11-03 DIAGNOSIS — I5032 Chronic diastolic (congestive) heart failure: Secondary | ICD-10-CM

## 2020-11-03 DIAGNOSIS — J449 Chronic obstructive pulmonary disease, unspecified: Secondary | ICD-10-CM

## 2020-11-03 DIAGNOSIS — I10 Essential (primary) hypertension: Secondary | ICD-10-CM

## 2020-11-03 DIAGNOSIS — E785 Hyperlipidemia, unspecified: Secondary | ICD-10-CM

## 2020-11-03 DIAGNOSIS — I5033 Acute on chronic diastolic (congestive) heart failure: Secondary | ICD-10-CM | POA: Diagnosis not present

## 2020-11-03 DIAGNOSIS — Z79899 Other long term (current) drug therapy: Secondary | ICD-10-CM

## 2020-11-03 DIAGNOSIS — I2510 Atherosclerotic heart disease of native coronary artery without angina pectoris: Secondary | ICD-10-CM

## 2020-11-03 MED ORDER — TORSEMIDE 40 MG PO TABS: 40.0000 mg | ORAL_TABLET | ORAL | 3 refills | Status: DC

## 2020-11-03 MED ORDER — APIXABAN 5 MG PO TABS: 5.0000 mg | ORAL_TABLET | Freq: Two times a day (BID) | ORAL | 11 refills | Status: AC

## 2020-11-03 MED ORDER — EZETIMIBE 10 MG PO TABS: 10.0000 mg | ORAL_TABLET | Freq: Every day | ORAL | 3 refills | Status: DC

## 2020-11-03 MED ORDER — TORSEMIDE 40 MG PO TABS: 40.0000 mg | ORAL_TABLET | ORAL | Status: DC

## 2020-11-03 MED ORDER — ATORVASTATIN CALCIUM 80 MG PO TABS: 80.0000 mg | ORAL_TABLET | Freq: Every day | ORAL | 3 refills | Status: AC

## 2020-11-03 MED ORDER — AMIODARONE HCL 200 MG PO TABS: 200.0000 mg | ORAL_TABLET | Freq: Every day | ORAL | 1 refills | Status: DC

## 2020-11-03 MED ORDER — AMIODARONE HCL 200 MG PO TABS: 200.0000 mg | ORAL_TABLET | Freq: Two times a day (BID) | ORAL | 3 refills | Status: DC

## 2020-11-03 MED ORDER — CARVEDILOL 3.125 MG PO TABS: 3.1250 mg | ORAL_TABLET | Freq: Two times a day (BID) | ORAL | 3 refills | Status: DC

## 2020-11-03 NOTE — Patient Instructions (Addendum)
Medication Instructions:  - Your physician has recommended you make the following change in your medication:   1) Alternate torsemide 40 mg- take 1 tablet (40 mg) once every other day with 0.5 tablet (20 mg) once every other day, - little bit more fluid intake   2) Decrease amiodarone 200 mg- take 1 tablet by mouth once daily   If you need a refill on your cardiac medications before your next appointment, please call your pharmacy.    Lab work: - Your physician recommends that you have lab work at your convenience: [facility] at [facility] 1st desk on the right to check in, past the screening table Lab hours: Monday- Friday (7:30 am- 5:30 pm)   If you have labs (blood work) drawn today and your tests are completely normal, you will receive your results only by: MyChart Message (if you have MyChart) OR A paper copy in the mail If you have any lab test that is abnormal or we need to change your treatment, we will call you to review the results.   Testing/Procedures: You have been referred to: [facility]- their office will call you directly to schedule an appointment.  Follow-Up: At [facility], you and your health needs are our priority.  As part of our continuing mission to provide you with exceptional heart care, we have created designated Provider Care Teams.  These Care Teams include your primary Cardiologist (physician) and Advanced Practice Providers (APPs -  Physician Assistants and Nurse Practitioners) who all work together to provide you with the care you need, when you need it.  You will need a follow up appointment in 6 months  Providers on your designated Care Team:   Murray Hodgkins, NP Christell Faith, PA-C Marrianne Mood, PA-C  Any Other Special Instructions Will Be Listed Below (If Applicable).  COVID-19 Vaccine Information can be found at: [email or web address] For questions related to  vaccine distribution or appointments, please email [email or web address] or call [phone].

## 2020-11-03 NOTE — Telephone Encounter (Addendum)
The patient was seen in the office this morning with Dr. Rockey Situ. She was needing her amiodarone refilled. In refilling this, the patient's current dose I noticed she was still amiodarone 200 mg BID. I advised the patient I would clarify with Dr. Rockey Situ if he wanted her to continue this dose and call her to verify his recommendations.  In reviewing with Dr. Rockey Situ, he advised the patient should: 1) Decrease amiodarone to 200 mg once daily (this sig has been updated at the patient's pharmacy) 2) have a TSH drawn at her convenience ([place] up to date per MD)  I called and spoke with [name], the patient's daughter who was present at her office visit this morning, and advised her of the above MD recommendations.  [name] voices understanding and is agreeable.  I advised [name] that since the patient has an appointment with Dr. Angelena Form on 6/27 at the [facility] office, then we could draw this lab at that appointment to save her a trip.  [name] again voiced understanding and was agreeable.  A secure chat message was sent to Dr. Camillia Herter nurse, Fraser Din to make her aware as well.   Updated AVS mailed to the patient.

## 2020-11-10 ENCOUNTER — Ambulatory Visit: Payer: 59 | Admitting: Cardiovascular Disease

## 2020-11-10 ENCOUNTER — Other Ambulatory Visit: Payer: Self-pay

## 2020-11-10 ENCOUNTER — Other Ambulatory Visit: Payer: 59 | Admitting: *Deleted

## 2020-11-10 DIAGNOSIS — Z79899 Other long term (current) drug therapy: Secondary | ICD-10-CM

## 2020-11-10 DIAGNOSIS — I480 Paroxysmal atrial fibrillation: Secondary | ICD-10-CM

## 2020-11-10 LAB — TSH: TSH: 1.71 u[IU]/mL (ref 0.450–4.500)

## 2020-11-10 NOTE — Progress Notes (Deleted)
Structural Heart Clinic Note  No chief complaint on file.  History of Present Illness: 70 yo female with history of morbid obesity, tobacco abuse, CAD, anxiety, asthma, chronic diastolic CHF, COPD on supplemental O2, GERD, diabetes mellitus, hypothyroidism, sleep apnea and severe aortic stenosis who is here today for follow up. I saw her in the structural heart clinic in November 2021. She was admitted to [facility] 03/28/20 with chest pain. Mild troponin elevation. Cardiac cath 11/15//21 with mild to moderate non-obstructive CAD, 60% proximal Circumflex stenosis. Mean gradient 31 mmHg, AVA 0.7 cm2. Echo 03/29/20 with LVEF=55-60%, mild LVH, normal RV systolic function. The aortic valve is not well seen but mean gradient 31 mmHg, peak gradient 53.6 mmHg, AVA 0.98 cm2. She was felt to have paradoxical low flow,low gradient AS. She has been a daily smoker for 50 + years. She wears supplemental O2 24 hours per day. Carotid artery dopplers without significant plaque. We proceeded with workup for TAVR in November 2021. Gated cardiac CT with anatomy suitable for a 26 mm Edwards Sapien 3 valve. 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 TAVR workup was delayed. Her lung cancer has been treated with radiation therapy. She was admitted to [facility] January 2022 with atrial flutter with RVR. Cardiac monitor January 2022 with sinus with short runs of atrial fibrillation. She is now on Eliquis and amiodarone. Echo 10/24/20 with LVEF=55-60%, moderate LVH. The aortic valve is thickened and calcified with mean gradient 29 mmHg, peak gradient 48 mmHg, AVA 0.85 cm2, SVI 37. This is most likely consistent with early severe aortic stenosis.   She has completed radiation therapy for her lung cancer. She is still smoking. She recenlty broke her arm. She has had several hospitalization with COPD exacerbation.    She tells  me today that she ***.   She has no teeth and no  dentures. She is retired from [employer] work. She lives with her daughter in [place].   Primary Care Physician: [name] Primary Cardiologist: Rockey Situ Referring Cardiologist: Rockey Situ  Past Medical History:  Diagnosis Date   Anxiety    Asthma    CHF (congestive heart failure) ([place])    2018   COPD (chronic obstructive pulmonary disease) ([place])    Diabetes mellitus without complication (HCC)    Dyspnea    GERD (gastroesophageal reflux disease)    History of hiatal hernia    History of orthopnea    History of radiation therapy 07/29/20-08/08/20   Right Lung, SBRT Dr. Gery Pray   Hypothyroidism    Neuropathy    Oxygen deficiency    3L/HS   Pain    BACK/DDD   Peripheral vascular disease (HCC)    RLS (restless legs syndrome)    Sleep apnea    Wheezing     Past Surgical History:  Procedure Laterality Date   BREAST SURGERY     CATARACT EXTRACTION W/PHACO Right 03/02/2018   Procedure: CATARACT EXTRACTION PHACO AND INTRAOCULAR LENS PLACEMENT ([place]);  Surgeon: Marchia Meiers, MD;  Location: [facility] ORS;  Service: Ophthalmology;  Laterality: Right;  [place] 01:15 CDE 16.46 Fluid pack lot # [id number]   CATARACT EXTRACTION W/PHACO Left 04/27/2018   Procedure: CATARACT EXTRACTION PHACO AND INTRAOCULAR LENS PLACEMENT ([place])- LEFT DIABETIC;  Surgeon: Marchia Meiers, MD;  Location: [facility] ORS;  Service: Ophthalmology;  Laterality: Left;  Lot # [id number] [place]: 00:46.3 CDE: 8.07    CYST EXCISION     FOREHEAD  FOOT SURGERY     CYST   RIGHT/LEFT HEART CATH AND CORONARY ANGIOGRAPHY N/A 03/31/2020   Procedure: RIGHT/LEFT HEART CATH AND CORONARY ANGIOGRAPHY;  Surgeon: Wellington Hampshire, MD;  Location: [facility];  Service: Cardiovascular;  Laterality: N/A;   TUBAL LIGATION      Current Outpatient Medications  Medication Sig Dispense Refill   albuterol (VENTOLIN HFA) 108 (90 Base) MCG/ACT inhaler Inhale 2 puffs into the lungs every 4 (four) hours as needed for wheezing or shortness of breath.       amiodarone (PACERONE) 200 MG tablet Take 1 tablet (200 mg total) by mouth daily. 90 tablet 1   apixaban (ELIQUIS) 5 MG TABS tablet Take 1 tablet (5 mg total) by mouth 2 (two) times daily. 60 tablet 11   atorvastatin (LIPITOR) 80 MG tablet Take 1 tablet (80 mg total) by mouth at bedtime. 90 tablet 3   carvedilol (COREG) 3.125 MG tablet Take 1 tablet (3.125 mg total) by mouth 2 (two) times daily. 180 tablet 3   citalopram (CELEXA) 40 MG tablet Take 40 mg by mouth daily.     diclofenac Sodium (VOLTAREN) 1 % GEL Apply 2 g topically 4 (four) times daily as needed.     docusate sodium (COLACE) 100 MG capsule Take 100 mg by mouth at bedtime as needed.     esomeprazole (NEXIUM) 40 MG capsule Take 40 mg by mouth daily.     ezetimibe (ZETIA) 10 MG tablet Take 1 tablet (10 mg total) by mouth daily. 90 tablet 3   famotidine (PEPCID) 40 MG tablet Take 40 mg by mouth daily.     fenofibrate 160 MG tablet Take 160 mg by mouth daily.     ferrous sulfate 325 (65 FE) MG tablet Take 325 mg by mouth daily.     gabapentin (NEURONTIN) 600 MG tablet Take 600 mg by mouth 2 (two) times daily.      guaiFENesin (MUCINEX) 600 MG 12 hr tablet Take 1 tablet (600 mg total) by mouth 2 (two) times daily for 15 days. 30 tablet 0   hydrOXYzine (VISTARIL) 50 MG capsule Take 50 mg by mouth every 6 (six) hours as needed for anxiety.     ipratropium-albuterol (DUONEB) 0.5-2.5 (3) MG/3ML SOLN Take 3 mLs by nebulization 4 (four) times daily.     irbesartan (AVAPRO) 300 MG tablet Take 0.5 tablets (150 mg total) by mouth daily. 90 tablet 3   levothyroxine (SYNTHROID) 100 MCG tablet Take by mouth.     lidocaine (LIDODERM) 5 % 3 patches daily.     lidocaine (LIDODERM) 5 % Apply topically.     losartan (COZAAR) 25 MG tablet Take 25 mg by mouth daily.     Melatonin 10 MG TABS Take 10 mg by mouth at bedtime as needed (sleep).     metFORMIN (GLUCOPHAGE) 500 MG tablet Take 500 mg by mouth 2 (two) times daily.     MOVANTIK 25 MG TABS tablet  Take 25 mg by mouth daily.     nicotine (NICODERM CQ - DOSED IN MG/24 HOURS) 21 mg/24hr patch Place 1 patch (21 mg total) onto the skin daily. 28 patch 5   oxyCODONE-acetaminophen (PERCOCET) 10-325 MG tablet Take 1 tablet by mouth 4 (four) times daily as needed.     sitaGLIPtin (JANUVIA) 100 MG tablet Take 100 mg by mouth daily.     Tiotropium Bromide Monohydrate 2.5 MCG/ACT AERS Inhale 2 puffs into the lungs daily.      Torsemide 40  MG TABS Take 40 mg by mouth every other day. Alternating with 20 mg by mouth every other day 30 tablet 3   Vitamin D, Ergocalciferol, (DRISDOL) 1.25 MG (50000 UNIT) CAPS capsule Take 1 capsule by mouth once a week.     No current facility-administered medications for this visit.    Allergies  Allergen Reactions   Altace [Ramipril] Swelling    Social History   Socioeconomic History   Marital status: Widowed    Spouse name: Not on file   Number of children: 4   Years of education: Not on file   Highest education level: Not on file  Occupational History   Occupation: Health and safety inspector  Tobacco Use   Smoking status: Former    Packs/day: 1.00    Years: 50.00    Pack years: 50.00    Types: Cigarettes   Smokeless tobacco: Never  Vaping Use   Vaping Use: Never used  Substance and Sexual Activity   Alcohol use: Not Currently   Drug use: Not on file   Sexual activity: Not on file  Other Topics Concern   Not on file  Social History Narrative   Not on file   Social Determinants of Health   Financial Resource Strain: Not on file  Food Insecurity: Not on file  Transportation Needs: Not on file  Physical Activity: Not on file  Stress: Not on file  Social Connections: Not on file  Intimate Partner Violence: Not on file    Family History  Problem Relation Age of Onset   Heart disease Mother    Cancer Father     Review of Systems:  As stated in the HPI and otherwise negative.   LMP  (LMP Unknown)   Physical Examination: General: Well  developed, well nourished, NAD  HEENT: OP clear, mucus membranes moist  SKIN: warm, dry. No rashes. Neuro: No focal deficits  Musculoskeletal: Muscle strength 5/5 all ext  Psychiatric: Mood and affect normal  Neck: No JVD, no carotid bruits, no thyromegaly, no lymphadenopathy.  Lungs:Clear bilaterally, no wheezes, rhonci, crackles Cardiovascular: Regular rate and rhythm. ***, loud harsh systolic murmur.  Abdomen:Soft. Bowel sounds present. Non-tender.  Extremities: No lower extremity edema. Pulses are 2 + in the bilateral DP/PT.   EKG:  EKG is not ordered today. The ekg from 03/28/20 is reviewed by ***   Echo 10/24/20:    1. Left ventricular ejection fraction, by estimation, is 55 to 60%. The  left ventricle has normal function. The left ventricle has no regional  wall motion abnormalities. There is moderate left ventricular hypertrophy.  Left ventricular diastolic function   could not be evaluated.   2. Pulmonary artery pressure is at least upper normal to mildly elevated  (RVSP 30 mmHg plus central venous pressure). Right ventricular systolic  function is normal. The right ventricular size is mildly enlarged.   3. Left atrial size was mildly dilated.   4. Right atrial size was mildly dilated.   5. The mitral valve is degenerative. No evidence of mitral valve  regurgitation. No evidence of mitral stenosis. Moderate to severe mitral  annular calcification.   6. The aortic valve has an indeterminant number of cusps. There is mild  calcification of the aortic valve. There is moderate thickening of the  aortic valve. Aortic valve regurgitation is not visualized. Moderate to  severe aortic valve stenosis. Aortic  valve area, by VTI measures 0.91 cm. Aortic valve mean gradient measures  29.0 mmHg.   FINDINGS  Left Ventricle: Left ventricular ejection fraction, by estimation, is 55  to 60%. The left ventricle has normal function. The left ventricle has no  regional wall motion  abnormalities. Definity contrast agent was given IV  to delineate the left ventricular   endocardial borders. The left ventricular internal cavity size was normal  in size. There is moderate left ventricular hypertrophy. Left ventricular  diastolic function could not be evaluated.   Right Ventricle: Pulmonary artery pressure is at least upper normal to  mildly elevated (RVSP 30 mmHg plus central venous pressure). The right  ventricular size is mildly enlarged. No increase in right ventricular wall  thickness. Right ventricular systolic   function is normal.   Left Atrium: Left atrial size was mildly dilated.   Right Atrium: Right atrial size was mildly dilated.   Pericardium: The pericardium was not well visualized.   Mitral Valve: The mitral valve is degenerative in appearance. There is  mild thickening of the mitral valve leaflet(s). Moderate to severe mitral  annular calcification. No evidence of mitral valve regurgitation. No  evidence of mitral valve stenosis.   Tricuspid Valve: The tricuspid valve is not well visualized. Tricuspid  valve regurgitation is trivial.   Aortic Valve: The aortic valve has an indeterminant number of cusps. There  is mild calcification of the aortic valve. There is moderate thickening of  the aortic valve. Aortic valve regurgitation is not visualized. Moderate  to severe aortic stenosis is  present. Aortic valve mean gradient measures 29.0 mmHg. Aortic valve peak  gradient measures 48.1 mmHg. Aortic valve area, by VTI measures 0.91 cm.   Pulmonic Valve: The pulmonic valve was not well visualized. Pulmonic valve  regurgitation is not visualized. No evidence of pulmonic stenosis.   Aorta: The aortic root and ascending aorta are structurally normal, with  no evidence of dilitation.   Pulmonary Artery: The pulmonary artery is not well seen.   Venous: The inferior vena cava was not well visualized.   IAS/Shunts: The interatrial septum was not  well visualized.      LEFT VENTRICLE  PLAX 2D  LVIDd:         4.50 cm  LVIDs:         3.10 cm  LV PW:         1.30 cm  LV IVS:        1.10 cm  LVOT diam:     2.00 cm  LV SV:         78  LV SV Index:   37  LVOT Area:     3.14 cm      RIGHT VENTRICLE             IVC  RV Basal diam:  4.20 cm     IVC diam: 2.15 cm  RV S prime:     12.10 cm/s  TAPSE (M-mode): 2.0 cm   LEFT ATRIUM              Index       RIGHT ATRIUM           Index  LA diam:        4.30 cm  2.04 cm/m  RA Area:     23.30 cm  LA Vol (A2C):   137.0 ml 64.86 ml/m RA Volume:   71.10 ml  33.66 ml/m  LA Vol (A4C):   46.0 ml  21.78 ml/m  LA Biplane Vol: 81.7 ml  38.68 ml/m   AORTIC VALVE  PULMONIC VALVE  AV Area (Vmax):    0.85 cm     PV Vmax:       0.96 m/s  AV Area (Vmean):   0.86 cm     PV Peak grad:  3.6 mmHg  AV Area (VTI):     0.91 cm  AV Vmax:           346.67 cm/s  AV Vmean:          242.000 cm/s  AV VTI:            0.863 m  AV Peak Grad:      48.1 mmHg  AV Mean Grad:      29.0 mmHg  LVOT Vmax:         93.25 cm/s  LVOT Vmean:        65.950 cm/s  LVOT VTI:          0.249 m  LVOT/AV VTI ratio: 0.29     AORTA  Ao Root diam: 2.60 cm  Ao Asc diam:  2.80 cm   TRICUSPID VALVE  TR Peak grad:   29.8 mmHg  TR Vmax:        273.00 cm/s     SHUNTS  Systemic VTI:  0.25 m  Systemic Diam: 2.00 cm     Cardiac cath 03/31/20: Prox RCA lesion is 30% stenosed. Mid RCA lesion is 40% stenosed. RPDA lesion is 30% stenosed. Prox Cx lesion is 60% stenosed. Mid LAD lesion is 20% stenosed. Mid Cx to Dist Cx lesion is 20% stenosed.   1.  Mild to moderate nonobstructive coronary artery disease.  Worst stenosis is 60% in the proximal left circumflex.  No evidence of obstructive disease.  Coronary arteries are overall moderately calcified. 2.  Right heart catheterization showed moderately elevated left-sided filling pressures, moderate pulmonary hypertension and moderately reduced cardiac  output. 3.  Severe aortic stenosis with mean gradient of 31 mmHg and calculated valve area of 0.7 cm.   Recommendations: The patient is significantly volume overloaded.  I switched furosemide to intravenous 20 mg twice daily.  I increased carvedilol for better blood pressure control. Recommend medical therapy for nonobstructive coronary artery disease. Recommend outpatient TAVR evaluation.  Left Main  Vessel is angiographically normal.  Left Anterior Descending  Mid LAD lesion is 20% stenosed. The lesion is mildly calcified.  Left Circumflex  Prox Cx lesion is 60% stenosed. The lesion is moderately calcified.  Mid Cx to Dist Cx lesion is 20% stenosed.  Second Obtuse Marginal Branch  Vessel is angiographically normal.  Third Obtuse Marginal Branch  Vessel is angiographically normal.  Right Coronary Artery  Prox RCA lesion is 30% stenosed. The lesion is mildly calcified.  Mid RCA lesion is 40% stenosed. The lesion is mildly calcified.  Right Posterior Descending Artery  RPDA lesion is 30% stenosed.  Intervention  No interventions have been documented. Coronary Diagrams  Diagnostic Dominance: Right  Intervention  Implants    No implant documentation for this case.  Syngo Images   Show images for CARDIAC CATHETERIZATION Images on Long Term Storage   Show images for Verdone, Janaki A Link to Procedure Log  Procedure Log    Hemo Data (last 6 days) before discharge   AO Systolic Cath Pressure  AO Diastolic Cath Pressure  AO Mean Cath Pressure  LV Systolic Cath Pressure  LV End Diastolic  LV Systolic  LV End Diastolic  LV dP/dt  PA Systolic Cath Pressure  PA Diastolic Cath Pressure  PA Mean Cath Pressure  RA Wedge A Wave  RA Wedge V Wave  RV Systolic Cath Pressure  RV Diastolic Cath Pressure  RV End Diastolic  RV Systolic  RV End Diastolic  RV dP/dt  PCW A Wave  PCW V Wave  PCW Mean  AO O2 Sat  PA O2 Sat  AO O2 Sat  Fick C.O.  Fick C.I.   --  --  --  --  --  202 mmHg  42 mmHg   1296 mmHg/sec  --  --  --  14 mmHg  9 mmHg  --  --  --  44 mmHg  9 mmHg  384 mmHg/sec  33 mmHg  35 mmHg  27 mmHg  --  --  --  4.19 L/min  2.05 L/min/m2   --  --  --  --  --  --  --  --  --  --  --  --  --  44 mmHg  5 mmHg  9 mmHg  --  --  --  --  --  --  --  --  --  --  --   --  --  --  --  --  --  --  --  47 mmHg  27 mmHg  35 mmHg  --  --  --  --  --  --  --  --  --  --  --  --  --  --  --  --   --  --  --  207 mmHg  31 mmHg  --  --  --  --  --  --  --  --  --  --  --  --  --  --  --  --  --  --  --  --  --  --   --  --  --  218 mmHg  32 mmHg  --  --  --  --  --  --  --  --  --  --  --  --  --  --  --  --  --  --  --  --  --  --   --  --  --  202 mmHg  42 mmHg  --  --  --  --  --  --  --  --  --  --  --  --  --  --  --  --  --  --  --  --  --  --   175  76 mmHg  115 mmHg  --  --  --  --  --  --  --  --  --  --  --  --  --  --  --  --  --  --  --  --  --  --  --  --   --  --  --  --  --  --  --  --  --  --  --  --  --  --  --  --  --  --  --  --  --  --  95.9 %  --  SA  --  --   --  --  --  --  --                                               Cardiac CT 04/14/20:  FINDINGS: Aortic Valve:  Trileaflet aortic valve with moderately calcified leaflets and moderately restricted leaflet opening. Aortic valve calcium score 1257.   Aorta: Normal size with mild diffuse atherosclerotic plaque and calcifications and no dissection.   Sinotubular Junction: 29 x 27 mm   Ascending Thoracic Aorta: 33 x 31 mm   Aortic Arch: 29 x 26 mm   Descending Thoracic Aorta: 25 x 23 mm   Sinus of Valsalva Measurements:   Non-coronary: 28 mm   Right -coronary: 30 mm   Left -coronary: 27 mm   Coronary Artery Height above Annulus:   Left Main: 12 mm   Right Coronary: 14 mm   Virtual Basal Annulus Measurements:   Maximum/Minimum Diameter: 27.1 x 20.9 mm   Mean Diameter: 23.8 mm   Perimeter: 76.3 mm   Area: 444 mm2   Optimum Fluoroscopic Angle for Delivery: RAO 2 CRA 3   IMPRESSION: 1. Trileaflet  aortic valve with moderately calcified leaflets and moderately restricted leaflet opening. Aortic valve calcium score 1257. (Severe if > 2000 in males). Annular measurements suitable for delivery of a 26 mm Edwards-SAPIEN 3 Ultra valve.   2. Sufficient coronary to annulus distance.   3. Optimum Fluoroscopic Angle for Delivery: RAO 2 CRA 3.   4. No thrombus in the left atrial appendage.  CTA chest/abd/pelvis 04/14/20: CTA CHEST FINDINGS   Cardiovascular: Heart size is mildly enlarged. There is no significant pericardial fluid, thickening or pericardial calcification. There is aortic atherosclerosis, as well as atherosclerosis of the great vessels of the mediastinum and the coronary arteries, including calcified atherosclerotic plaque in the left main, left anterior descending, left circumflex and right coronary arteries. Severe thickening calcification of the aortic valve. Severe calcifications of the mitral annulus.   Mediastinum/Lymph Nodes: No pathologically enlarged mediastinal or hilar lymph nodes. Esophagus is unremarkable in appearance. No axillary lymphadenopathy.   Lungs/Pleura: In the superior segment of the right lower lobe (axial image 45 of series 8 and sagittal image 88 of series 10) there is a 1.9 x 1.4 x 1.6 cm macrolobulated nodule which makes contact with the superior aspect of the right major fissure, concerning for potential primary bronchogenic neoplasm. No other suspicious appearing pulmonary nodules or masses are noted. No acute consolidative airspace disease. No pleural effusions.   Musculoskeletal/Soft Tissues: There are no aggressive appearing lytic or blastic lesions noted in the visualized portions of the skeleton.   CTA ABDOMEN AND PELVIS FINDINGS   Hepatobiliary: Diffuse low attenuation throughout the hepatic parenchyma, indicative of a background of hepatic steatosis. No discrete cystic or solid hepatic lesions. No intra or  extrahepatic biliary ductal dilatation. Small calcified gallstones lying dependently in the gallbladder. No findings to suggest an acute cholecystitis at this time.   Pancreas: No pancreatic mass. No pancreatic ductal dilatation. No pancreatic or peripancreatic fluid collections or inflammatory changes.   Spleen: Unremarkable.   Adrenals/Urinary Tract: Exophytic 3.8 cm high attenuation lesion in the upper pole of the left kidney is incompletely characterized on today's examination, but is similar in size to prior study from 02/08/2016, presumably a chronic proteinaceous/hemorrhagic cyst. Several other subcentimeter low-attenuation lesions are noted in both kidneys, too small to definitively characterize, but statistically likely to represent tiny cysts. Bilateral adrenal glands are normal in appearance. No hydroureteronephrosis. Urinary bladder is normal in appearance.   Stomach/Bowel: Normal appearance of the stomach. No pathologic dilatation of small bowel or colon. Several colonic diverticulae are noted, particularly in the sigmoid colon, without surrounding inflammatory changes to suggest an acute diverticulitis at this  time. Normal appendix.   Vascular/Lymphatic: Aortic atherosclerosis, with vascular findings and measurements pertinent to potential TAVR procedure, as detailed below. No aneurysm or dissection noted in the abdominal or pelvic vasculature. No lymphadenopathy noted in the abdomen or pelvis.   Reproductive: Uterus and ovaries are a trophic.   Other: No significant volume of ascites.  No pneumoperitoneum.   Musculoskeletal: There are no aggressive appearing lytic or blastic lesions noted in the visualized portions of the skeleton.   VASCULAR MEASUREMENTS PERTINENT TO TAVR:   AORTA:   Minimal Aortic Diameter-13 x 12 mm   Severity of Aortic Calcification-mild-to-moderate   RIGHT PELVIS:   Right Common Iliac Artery -   Minimal Diameter-7.8 x 7.2 mm    Tortuosity-mild   Calcification-moderate   Right External Iliac Artery -   Minimal Diameter-6.2 x 5.2 mm   Tortuosity-mild to moderate   Calcification-mild   Right Common Femoral Artery -   Minimal Diameter-6.1 x 6.0 mm   Tortuosity-mild   Calcification-mild   LEFT PELVIS:   Left Common Iliac Artery -   Minimal Diameter-6.4 x 6.4 mm   Tortuosity-mild   Calcification-moderate   Left External Iliac Artery -   Minimal Diameter-6.4 x 5.2 mm   Tortuosity-mild   Calcification-none   Left Common Femoral Artery -   Minimal Diameter-6.2 x 5.1 mm   Tortuosity-mild   Calcification-mild   Review of the MIP images confirms the above findings.   IMPRESSION: 1. Vascular findings and measurements pertinent to potential TAVR procedure, as detailed above. 2. Severe thickening calcification of the aortic valve, compatible with reported clinical history of severe aortic stenosis. 3. 1.9 x 1.4 x 1.6 cm macrolobulated nodule in the superior segment of the right lower lobe, highly concerning for primary bronchogenic neoplasm. Further evaluation with PET-CT is recommended in the near future to better evaluate this finding and assess for potential metastatic disease. No definite metastatic disease is identified in the chest, abdomen or pelvis on today's examination. 4. Aortic atherosclerosis, in addition to left main and 3 vessel coronary artery disease. 5. Severe calcifications of the mitral annulus. 6. Severe hepatic steatosis. 7. Colonic diverticulosis without evidence of acute diverticulitis at this time. 8. Additional incidental findings, as above.  Recent Labs: 05/26/2020: TSH 1.133 10/02/2020: Magnesium 2.1 10/29/2020: ALT 16; B Natriuretic Peptide 326.3; BUN 36; Creatinine, Ser 1.53; Hemoglobin 9.5; Platelets 216; Potassium 5.1; Sodium 141    Wt Readings from Last 3 Encounters:  11/03/20 245 lb 4 oz (111.2 kg)  10/29/20 245 lb (111.1 kg)  10/03/20 252 lb 14.4  oz (114.7 kg)     Other studies Reviewed: Additional studies/ records that were reviewed today include: hospital notes, echo images, cath images, ekg Review of the above records demonstrates: Moderately severe to severe AS   Assessment and Plan:   1. Severe Aortic Valve Stenosis: She likely has moderate to early severe aortic stenosis. I have personally reviewed the echo images. *** She will not be a good candidate for surgical AVR given morbid obesity and COPD. She may be a candidate for TAVR.    STS Risk Score: Risk of Mortality: 3.045% Renal Failure: 3.151% Permanent Stroke: 0.619% Prolonged Ventilation: 11.641% DSW Infection: 0.247% Reoperation: 1.878% Morbidity or Mortality: 15.156% Short Length of Stay: 28.549% Long Length of Stay: 7.541%  I have reviewed the natural history of aortic stenosis with the patient and their family members  who are present today. We have discussed the limitations of medical therapy and the poor prognosis associated with  symptomatic aortic stenosis. We have reviewed potential treatment options, including palliative medical therapy, conventional surgical aortic valve replacement, and transcatheter aortic valve replacement. We discussed treatment options in the context of the patient's specific comorbid medical conditions.   ***     Current medicines are reviewed at length with the patient today.  The patient does not have concerns regarding medicines.  The following changes have been made:  no change  Labs/ tests ordered today include:  No orders of the defined types were placed in this encounter.    Disposition:   FU with the valve team.    Signed, Lauree Chandler, MD 11/10/2020 6:24 AM    [facility] [place], [place],   [address] Phone: [phone]; Fax: [phone]

## 2020-11-24 NOTE — Progress Notes (Signed)
Pt's lab results mailed to pt, pt is not utilizing his MyChart account to see results   

## 2020-11-28 NOTE — Progress Notes (Signed)
Structural Heart Clinic Note  Chief Complaint  Patient presents with   Follow-up    Aortic stenosis    History of Present Illness: 70 yo female with history of morbid obesity, tobacco abuse, CAD, anxiety, asthma, chronic diastolic CHF, COPD on supplemental O2, GERD, diabetes mellitus, hypothyroidism, sleep apnea and severe aortic stenosis who is here today for follow up. I saw her in the structural heart clinic in November 2021. She was admitted to [facility] 03/28/20 with chest pain. Mild troponin elevation. Cardiac cath 11/15//21 with mild to moderate non-obstructive CAD, 60% proximal Circumflex stenosis. Mean gradient 31 mmHg, AVA 0.7 cm2. Echo 03/29/20 with LVEF=55-60%, mild LVH, normal RV systolic function. The aortic valve is not well seen but mean gradient 31 mmHg, peak gradient 53.6 mmHg, AVA 0.98 cm2. She was felt to have paradoxical low flow,low gradient AS. She has been a daily smoker for 50 + years. She wears supplemental O2 24 hours per day. Carotid artery dopplers without significant plaque. We proceeded with workup for TAVR in November 2021. Gated cardiac CT with anatomy suitable for a 26 mm Edwards Sapien 3 valve. 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 TAVR workup was delayed. Her lung cancer has been treated with radiation therapy. She was admitted to [facility] January 2022 with atrial flutter with RVR. Cardiac monitor January 2022 with sinus with short runs of atrial fibrillation. She is now on Eliquis and amiodarone. Echo 10/24/20 with LVEF=55-60%, moderate LVH. The aortic valve is thickened and calcified with mean gradient 29 mmHg, peak gradient 48 mmHg, AVA 0.85 cm2, SVI 37. This is consistent with moderate to severe aortic stenosis. She has completed radiation therapy for her lung cancer. She is still smoking. She recenlty broke her arm after a fall. She has had several hospitalizations with COPD exacerbation.   She tells  me  today that she is doing well. No change in baseline dyspnea on supplemental O2. She has ongoing fatigue. She is confined to a wheelchair most of the day. No chest pain or dizziness. She has no teeth and no dentures. She is retired from [employer] work. She lives with her daughter in [place].   Primary Care Physician: [name] Primary Cardiologist: Rockey Situ Referring Cardiologist: Rockey Situ  Past Medical History:  Diagnosis Date   Anxiety    Asthma    CHF (congestive heart failure) ([place])    2018   COPD (chronic obstructive pulmonary disease) ([place])    Diabetes mellitus without complication (HCC)    Dyspnea    GERD (gastroesophageal reflux disease)    History of hiatal hernia    History of orthopnea    History of radiation therapy 07/29/20-08/08/20   Right Lung, SBRT Dr. Gery Pray   Hypothyroidism    Neuropathy    Oxygen deficiency    3L/HS   Pain    BACK/DDD   Peripheral vascular disease (HCC)    RLS (restless legs syndrome)    Sleep apnea    Wheezing     Past Surgical History:  Procedure Laterality Date   BREAST SURGERY     CATARACT EXTRACTION W/PHACO Right 03/02/2018   Procedure: CATARACT EXTRACTION PHACO AND INTRAOCULAR LENS PLACEMENT ([place]);  Surgeon: Marchia Meiers, MD;  Location: [facility] ORS;  Service: Ophthalmology;  Laterality: Right;  [place] 01:15 CDE 16.46 Fluid pack lot # [id number]   CATARACT EXTRACTION W/PHACO Left 04/27/2018   Procedure: CATARACT EXTRACTION PHACO AND INTRAOCULAR LENS PLACEMENT ([facility])-  LEFT DIABETIC;  Surgeon: Marchia Meiers, MD;  Location: [facility] ORS;  Service: Ophthalmology;  Laterality: Left;  Lot # [id number] [place]: 00:46.3 CDE: 8.07    CYST EXCISION     FOREHEAD   FOOT SURGERY     CYST   RIGHT/LEFT HEART CATH AND CORONARY ANGIOGRAPHY N/A 03/31/2020   Procedure: RIGHT/LEFT HEART CATH AND CORONARY ANGIOGRAPHY;  Surgeon: Wellington Hampshire, MD;  Location: [facility];  Service: Cardiovascular;  Laterality: N/A;   TUBAL LIGATION      Current Outpatient  Medications  Medication Sig Dispense Refill   albuterol (VENTOLIN HFA) 108 (90 Base) MCG/ACT inhaler Inhale 2 puffs into the lungs every 4 (four) hours as needed for wheezing or shortness of breath.      amiodarone (PACERONE) 200 MG tablet Take 1 tablet (200 mg total) by mouth daily. 90 tablet 1   apixaban (ELIQUIS) 5 MG TABS tablet Take 1 tablet (5 mg total) by mouth 2 (two) times daily. 60 tablet 11   atorvastatin (LIPITOR) 80 MG tablet Take 1 tablet (80 mg total) by mouth at bedtime. 90 tablet 3   carvedilol (COREG) 3.125 MG tablet Take 1 tablet (3.125 mg total) by mouth 2 (two) times daily. 180 tablet 3   citalopram (CELEXA) 40 MG tablet Take 40 mg by mouth daily.     diclofenac Sodium (VOLTAREN) 1 % GEL Apply 2 g topically 4 (four) times daily as needed.     docusate sodium (COLACE) 100 MG capsule Take 100 mg by mouth at bedtime as needed.     ezetimibe (ZETIA) 10 MG tablet Take 1 tablet (10 mg total) by mouth daily. 90 tablet 3   famotidine (PEPCID) 40 MG tablet Take 40 mg by mouth daily.     fenofibrate 160 MG tablet Take 160 mg by mouth daily.     ferrous sulfate 325 (65 FE) MG tablet Take 325 mg by mouth daily.     furosemide (LASIX) 20 MG tablet Take 20 mg by mouth every other day. Alternate with 40 mg     furosemide (LASIX) 40 MG tablet Take 40 mg by mouth every other day. Alternating with 20 mg     gabapentin (NEURONTIN) 600 MG tablet Take 600 mg by mouth 2 (two) times daily.      hydrOXYzine (VISTARIL) 50 MG capsule Take 50 mg by mouth every 6 (six) hours as needed for anxiety.     ipratropium-albuterol (DUONEB) 0.5-2.5 (3) MG/3ML SOLN Take 3 mLs by nebulization 4 (four) times daily.     irbesartan (AVAPRO) 300 MG tablet Take 0.5 tablets (150 mg total) by mouth daily. 90 tablet 3   levothyroxine (SYNTHROID) 100 MCG tablet Take by mouth.     lidocaine (LIDODERM) 5 % 3 patches daily.     lidocaine (LIDODERM) 5 % Apply topically.     losartan (COZAAR) 25 MG tablet Take 25 mg by mouth  daily.     Melatonin 10 MG TABS Take 10 mg by mouth at bedtime as needed (sleep).     metFORMIN (GLUCOPHAGE) 500 MG tablet Take 500 mg by mouth 2 (two) times daily.     MOVANTIK 25 MG TABS tablet Take 25 mg by mouth daily.     nicotine (NICODERM CQ - DOSED IN MG/24 HOURS) 21 mg/24hr patch Place 1 patch (21 mg total) onto the skin daily. 28 patch 5   oxyCODONE-acetaminophen (PERCOCET) 10-325 MG tablet Take 1 tablet by mouth 4 (four) times daily as needed.  pantoprazole (PROTONIX) 40 MG tablet Take 40 mg by mouth 2 (two) times daily.     sitaGLIPtin (JANUVIA) 100 MG tablet Take 100 mg by mouth daily.     Tiotropium Bromide Monohydrate 2.5 MCG/ACT AERS Inhale 2 puffs into the lungs daily.      Torsemide 40 MG TABS Take 40 mg by mouth every other day. Alternating with 20 mg by mouth every other day 30 tablet 3   Vitamin D, Ergocalciferol, (DRISDOL) 1.25 MG (50000 UNIT) CAPS capsule Take 1 capsule by mouth once a week.     No current facility-administered medications for this visit.    Allergies  Allergen Reactions   Altace [Ramipril] Swelling    Social History   Socioeconomic History   Marital status: Widowed    Spouse name: Not on file   Number of children: 4   Years of education: Not on file   Highest education level: Not on file  Occupational History   Occupation: Health and safety inspector  Tobacco Use   Smoking status: Former    Packs/day: 1.00    Years: 50.00    Pack years: 50.00    Types: Cigarettes   Smokeless tobacco: Never  Vaping Use   Vaping Use: Never used  Substance and Sexual Activity   Alcohol use: Not Currently   Drug use: Not on file   Sexual activity: Not on file  Other Topics Concern   Not on file  Social History Narrative   Not on file   Social Determinants of Health   Financial Resource Strain: Not on file  Food Insecurity: Not on file  Transportation Needs: Not on file  Physical Activity: Not on file  Stress: Not on file  Social Connections: Not  on file  Intimate Partner Violence: Not on file    Family History  Problem Relation Age of Onset   Heart disease Mother    Cancer Father     Review of Systems:  As stated in the HPI and otherwise negative.   BP 130/60   Pulse 67   Ht 5\' 2"  (1.575 m)   Wt 238 lb 9.6 oz (108.2 kg)   LMP  (LMP Unknown)   SpO2 95%   BMI 43.64 kg/m   Physical Examination:  General: Well developed, well nourished, NAD  HEENT: OP clear, mucus membranes moist  SKIN: warm, dry. No rashes. Neuro: No focal deficits  Musculoskeletal: Muscle strength 5/5 all ext  Psychiatric: Mood and affect normal  Neck: No JVD, no carotid bruits, no thyromegaly, no lymphadenopathy.  Lungs:Clear bilaterally, no wheezes, rhonci, crackles Cardiovascular: Regular rate and rhythm. Harsh systolic murmur.  Abdomen:Soft. Bowel sounds present. Non-tender.  Extremities: No lower extremity edema. Pulses are 2 + in the bilateral DP/PT.  EKG:  EKG is not ordered today.  Echo 10/24/20:     1. Left ventricular ejection fraction, by estimation, is 55 to 60%. The  left ventricle has normal function. The left ventricle has no regional  wall motion abnormalities. There is moderate left ventricular hypertrophy.  Left ventricular diastolic function   could not be evaluated.   2. Pulmonary artery pressure is at least upper normal to mildly elevated  (RVSP 30 mmHg plus central venous pressure). Right ventricular systolic  function is normal. The right ventricular size is mildly enlarged.   3. Left atrial size was mildly dilated.   4. Right atrial size was mildly dilated.   5. The mitral valve is degenerative. No evidence of mitral valve  regurgitation. No  evidence of mitral stenosis. Moderate to severe mitral  annular calcification.   6. The aortic valve has an indeterminant number of cusps. There is mild  calcification of the aortic valve. There is moderate thickening of the  aortic valve. Aortic valve regurgitation is not  visualized. Moderate to  severe aortic valve stenosis. Aortic  valve area, by VTI measures 0.91 cm. Aortic valve mean gradient measures  29.0 mmHg.   FINDINGS   Left Ventricle: Left ventricular ejection fraction, by estimation, is 55  to 60%. The left ventricle has normal function. The left ventricle has no  regional wall motion abnormalities. Definity contrast agent was given IV  to delineate the left ventricular   endocardial borders. The left ventricular internal cavity size was normal  in size. There is moderate left ventricular hypertrophy. Left ventricular  diastolic function could not be evaluated.   Right Ventricle: Pulmonary artery pressure is at least upper normal to  mildly elevated (RVSP 30 mmHg plus central venous pressure). The right  ventricular size is mildly enlarged. No increase in right ventricular wall  thickness. Right ventricular systolic   function is normal.   Left Atrium: Left atrial size was mildly dilated.   Right Atrium: Right atrial size was mildly dilated.   Pericardium: The pericardium was not well visualized.   Mitral Valve: The mitral valve is degenerative in appearance. There is  mild thickening of the mitral valve leaflet(s). Moderate to severe mitral  annular calcification. No evidence of mitral valve regurgitation. No  evidence of mitral valve stenosis.   Tricuspid Valve: The tricuspid valve is not well visualized. Tricuspid  valve regurgitation is trivial.   Aortic Valve: The aortic valve has an indeterminant number of cusps. There  is mild calcification of the aortic valve. There is moderate thickening of  the aortic valve. Aortic valve regurgitation is not visualized. Moderate  to severe aortic stenosis is  present. Aortic valve mean gradient measures 29.0 mmHg. Aortic valve peak  gradient measures 48.1 mmHg. Aortic valve area, by VTI measures 0.91 cm.   Pulmonic Valve: The pulmonic valve was not well visualized. Pulmonic valve   regurgitation is not visualized. No evidence of pulmonic stenosis.   Aorta: The aortic root and ascending aorta are structurally normal, with  no evidence of dilitation.   Pulmonary Artery: The pulmonary artery is not well seen.   Venous: The inferior vena cava was not well visualized.   IAS/Shunts: The interatrial septum was not well visualized.      LEFT VENTRICLE  PLAX 2D  LVIDd:         4.50 cm  LVIDs:         3.10 cm  LV PW:         1.30 cm  LV IVS:        1.10 cm  LVOT diam:     2.00 cm  LV SV:         78  LV SV Index:   37  LVOT Area:     3.14 cm      RIGHT VENTRICLE             IVC  RV Basal diam:  4.20 cm     IVC diam: 2.15 cm  RV S prime:     12.10 cm/s  TAPSE (M-mode): 2.0 cm   LEFT ATRIUM              Index       RIGHT ATRIUM  Index  LA diam:        4.30 cm  2.04 cm/m  RA Area:     23.30 cm  LA Vol (A2C):   137.0 ml 64.86 ml/m RA Volume:   71.10 ml  33.66 ml/m  LA Vol (A4C):   46.0 ml  21.78 ml/m  LA Biplane Vol: 81.7 ml  38.68 ml/m   AORTIC VALVE                    PULMONIC VALVE  AV Area (Vmax):    0.85 cm     PV Vmax:       0.96 m/s  AV Area (Vmean):   0.86 cm     PV Peak grad:  3.6 mmHg  AV Area (VTI):     0.91 cm  AV Vmax:           346.67 cm/s  AV Vmean:          242.000 cm/s  AV VTI:            0.863 m  AV Peak Grad:      48.1 mmHg  AV Mean Grad:      29.0 mmHg  LVOT Vmax:         93.25 cm/s  LVOT Vmean:        65.950 cm/s  LVOT VTI:          0.249 m  LVOT/AV VTI ratio: 0.29     AORTA  Ao Root diam: 2.60 cm  Ao Asc diam:  2.80 cm   TRICUSPID VALVE  TR Peak grad:   29.8 mmHg  TR Vmax:        273.00 cm/s     SHUNTS  Systemic VTI:  0.25 m  Systemic Diam: 2.00 cm     Cardiac CT 04/14/20:   FINDINGS:  Aortic Valve: Trileaflet aortic valve with moderately calcified  leaflets and moderately restricted leaflet opening. Aortic valve  calcium score 1257.     Aorta: Normal size with mild diffuse atherosclerotic plaque  and  calcifications and no dissection.     Sinotubular Junction: 29 x 27 mm     Ascending Thoracic Aorta: 33 x 31 mm     Aortic Arch: 29 x 26 mm     Descending Thoracic Aorta: 25 x 23 mm     Sinus of Valsalva Measurements:     Non-coronary: 28 mm     Right -coronary: 30 mm     Left -coronary: 27 mm     Coronary Artery Height above Annulus:     Left Main: 12 mm     Right Coronary: 14 mm     Virtual Basal Annulus Measurements:     Maximum/Minimum Diameter: 27.1 x 20.9 mm     Mean Diameter: 23.8 mm     Perimeter: 76.3 mm     Area: 444 mm2     Optimum Fluoroscopic Angle for Delivery: RAO 2 CRA 3     IMPRESSION:  1. Trileaflet aortic valve with moderately calcified leaflets and  moderately restricted leaflet opening. Aortic valve calcium score  1257. (Severe if > 2000 in males). Annular measurements suitable for  delivery of a 26 mm Edwards-SAPIEN 3 Ultra valve.     2. Sufficient coronary to annulus distance.     3. Optimum Fluoroscopic Angle for Delivery: RAO 2 CRA 3.     4. No thrombus in the left atrial appendage.   CTA chest/abd/pelvis 04/14/20:  CTA  CHEST FINDINGS     Cardiovascular: Heart size is mildly enlarged. There is no  significant pericardial fluid, thickening or pericardial  calcification. There is aortic atherosclerosis, as well as  atherosclerosis of the great vessels of the mediastinum and the  coronary arteries, including calcified atherosclerotic plaque in the  left main, left anterior descending, left circumflex and right  coronary arteries. Severe thickening calcification of the aortic  valve. Severe calcifications of the mitral annulus.     Mediastinum/Lymph Nodes: No pathologically enlarged mediastinal or  hilar lymph nodes. Esophagus is unremarkable in appearance. No  axillary lymphadenopathy.     Lungs/Pleura: In the superior segment of the right lower lobe (axial  image 45 of series 8 and sagittal image 88 of series 10) there is a   1.9 x 1.4 x 1.6 cm macrolobulated nodule which makes contact with  the superior aspect of the right major fissure, concerning for  potential primary bronchogenic neoplasm. No other suspicious  appearing pulmonary nodules or masses are noted. No acute  consolidative airspace disease. No pleural effusions.     Musculoskeletal/Soft Tissues: There are no aggressive appearing  lytic or blastic lesions noted in the visualized portions of the  skeleton.     CTA ABDOMEN AND PELVIS FINDINGS     Hepatobiliary: Diffuse low attenuation throughout the hepatic  parenchyma, indicative of a background of hepatic steatosis. No  discrete cystic or solid hepatic lesions. No intra or extrahepatic  biliary ductal dilatation. Small calcified gallstones lying  dependently in the gallbladder. No findings to suggest an acute  cholecystitis at this time.     Pancreas: No pancreatic mass. No pancreatic ductal dilatation. No  pancreatic or peripancreatic fluid collections or inflammatory  changes.     Spleen: Unremarkable.     Adrenals/Urinary Tract: Exophytic 3.8 cm high attenuation lesion in  the upper pole of the left kidney is incompletely characterized on  today's examination, but is similar in size to prior study from  02/08/2016, presumably a chronic proteinaceous/hemorrhagic cyst.  Several other subcentimeter low-attenuation lesions are noted in  both kidneys, too small to definitively characterize, but  statistically likely to represent tiny cysts. Bilateral adrenal  glands are normal in appearance. No hydroureteronephrosis. Urinary  bladder is normal in appearance.     Stomach/Bowel: Normal appearance of the stomach. No pathologic  dilatation of small bowel or colon. Several colonic diverticulae are  noted, particularly in the sigmoid colon, without surrounding  inflammatory changes to suggest an acute diverticulitis at this  time. Normal appendix.     Vascular/Lymphatic: Aortic  atherosclerosis, with vascular findings  and measurements pertinent to potential TAVR procedure, as detailed  below. No aneurysm or dissection noted in the abdominal or pelvic  vasculature. No lymphadenopathy noted in the abdomen or pelvis.     Reproductive: Uterus and ovaries are a trophic.     Other: No significant volume of ascites.  No pneumoperitoneum.     Musculoskeletal: There are no aggressive appearing lytic or blastic  lesions noted in the visualized portions of the skeleton.     VASCULAR MEASUREMENTS PERTINENT TO TAVR:     AORTA:     Minimal Aortic Diameter-13 x 12 mm     Severity of Aortic Calcification-mild-to-moderate     RIGHT PELVIS:     Right Common Iliac Artery -     Minimal Diameter-7.8 x 7.2 mm     Tortuosity-mild     Calcification-moderate     Right External Iliac Artery -  Minimal Diameter-6.2 x 5.2 mm     Tortuosity-mild to moderate     Calcification-mild     Right Common Femoral Artery -     Minimal Diameter-6.1 x 6.0 mm     Tortuosity-mild     Calcification-mild     LEFT PELVIS:     Left Common Iliac Artery -     Minimal Diameter-6.4 x 6.4 mm     Tortuosity-mild     Calcification-moderate     Left External Iliac Artery -     Minimal Diameter-6.4 x 5.2 mm     Tortuosity-mild     Calcification-none     Left Common Femoral Artery -     Minimal Diameter-6.2 x 5.1 mm     Tortuosity-mild     Calcification-mild     Review of the MIP images confirms the above findings.     IMPRESSION:  1. Vascular findings and measurements pertinent to potential TAVR  procedure, as detailed above.  2. Severe thickening calcification of the aortic valve, compatible  with reported clinical history of severe aortic stenosis.  3. 1.9 x 1.4 x 1.6 cm macrolobulated nodule in the superior segment  of the right lower lobe, highly concerning for primary bronchogenic  neoplasm. Further evaluation with PET-CT is recommended in the near  future  to better evaluate this finding and assess for potential  metastatic disease. No definite metastatic disease is identified in  the chest, abdomen or pelvis on today's examination.  4. Aortic atherosclerosis, in addition to left main and 3 vessel  coronary artery disease.  5. Severe calcifications of the mitral annulus.  6. Severe hepatic steatosis.  7. Colonic diverticulosis without evidence of acute diverticulitis  at this time.  8. Additional incidental findings, as above.   Cardiac cath 03/31/20: Prox RCA lesion is 30% stenosed. Mid RCA lesion is 40% stenosed. RPDA lesion is 30% stenosed. Prox Cx lesion is 60% stenosed. Mid LAD lesion is 20% stenosed. Mid Cx to Dist Cx lesion is 20% stenosed.   1.  Mild to moderate nonobstructive coronary artery disease.  Worst stenosis is 60% in the proximal left circumflex.  No evidence of obstructive disease.  Coronary arteries are overall moderately calcified. 2.  Right heart catheterization showed moderately elevated left-sided filling pressures, moderate pulmonary hypertension and moderately reduced cardiac output. 3.  Severe aortic stenosis with mean gradient of 31 mmHg and calculated valve area of 0.7 cm.   Recommendations: The patient is significantly volume overloaded.  I switched furosemide to intravenous 20 mg twice daily.  I increased carvedilol for better blood pressure control. Recommend medical therapy for nonobstructive coronary artery disease. Recommend outpatient TAVR evaluation.  Left Main  Vessel is angiographically normal.  Left Anterior Descending  Mid LAD lesion is 20% stenosed. The lesion is mildly calcified.  Left Circumflex  Prox Cx lesion is 60% stenosed. The lesion is moderately calcified.  Mid Cx to Dist Cx lesion is 20% stenosed.  Second Obtuse Marginal Branch  Vessel is angiographically normal.  Third Obtuse Marginal Branch  Vessel is angiographically normal.  Right Coronary Artery  Prox RCA lesion is 30%  stenosed. The lesion is mildly calcified.  Mid RCA lesion is 40% stenosed. The lesion is mildly calcified.  Right Posterior Descending Artery  RPDA lesion is 30% stenosed.  Intervention  No interventions have been documented. Coronary Diagrams  Diagnostic Dominance: Right  Intervention  Implants    No implant documentation for this case.  Syngo Images   Show images  for CARDIAC CATHETERIZATION Images on Long Term Storage   Show images for Bronaugh, Marliyah A Link to Procedure Log  Procedure Log    Hemo Data (last 6 days) before discharge   AO Systolic Cath Pressure  AO Diastolic Cath Pressure  AO Mean Cath Pressure  LV Systolic Cath Pressure  LV End Diastolic  LV Systolic  LV End Diastolic  LV dP/dt  PA Systolic Cath Pressure  PA Diastolic Cath Pressure  PA Mean Cath Pressure  RA Wedge A Wave  RA Wedge V Wave  RV Systolic Cath Pressure  RV Diastolic Cath Pressure  RV End Diastolic  RV Systolic  RV End Diastolic  RV dP/dt  PCW A Wave  PCW V Wave  PCW Mean  AO O2 Sat  PA O2 Sat  AO O2 Sat  Fick C.O.  Fick C.I.   --  --  --  --  --  202 mmHg  42 mmHg  1296 mmHg/sec  --  --  --  14 mmHg  9 mmHg  --  --  --  44 mmHg  9 mmHg  384 mmHg/sec  33 mmHg  35 mmHg  27 mmHg  --  --  --  4.19 L/min  2.05 L/min/m2   --  --  --  --  --  --  --  --  --  --  --  --  --  44 mmHg  5 mmHg  9 mmHg  --  --  --  --  --  --  --  --  --  --  --   --  --  --  --  --  --  --  --  47 mmHg  27 mmHg  35 mmHg  --  --  --  --  --  --  --  --  --  --  --  --  --  --  --  --   --  --  --  207 mmHg  31 mmHg  --  --  --  --  --  --  --  --  --  --  --  --  --  --  --  --  --  --  --  --  --  --   --  --  --  218 mmHg  32 mmHg  --  --  --  --  --  --  --  --  --  --  --  --  --  --  --  --  --  --  --  --  --  --   --  --  --  202 mmHg  42 mmHg  --  --  --  --  --  --  --  --  --  --  --  --  --  --  --  --  --  --  --  --  --  --   175  76 mmHg  115 mmHg  --  --  --  --  --  --  --  --  --  --  --  --  --  --  --  --  --  --   --  --  --  --  --  --   --  --  --  --  --  --  --  --  --  --  --  --  --  --  --  --  --  --  --  --  --  --  95.9 %  --  SA  --  --   --  --  --  --  --                                                 Recent Labs: 10/02/2020: Magnesium 2.1 10/29/2020: ALT 16; B Natriuretic Peptide 326.3; BUN 36; Creatinine, Ser 1.53; Hemoglobin 9.5; Platelets 216; Potassium 5.1; Sodium 141 11/10/2020: TSH 1.710   Wt Readings from Last 3 Encounters:  12/11/20 238 lb 9.6 oz (108.2 kg)  11/03/20 245 lb 4 oz (111.2 kg)  10/29/20 245 lb (111.1 kg)    STS Risk Score: Risk of Mortality: 3.045% Renal Failure: 3.151% Permanent Stroke: 0.619% Prolonged Ventilation: 11.641% DSW Infection: 0.247% Reoperation: 1.878% Morbidity or Mortality: 15.156% Short Length of Stay: 28.549% Long Length of Stay: 7.541%  Assessment and Plan:   1. Severe Aortic Valve Stenosis: She has moderate to severe aortic stenosis. All of the echo parameters favor moderate AS. I have personally reviewed the echo images. She will not be a good candidate for surgical AVR given morbid obesity and COPD. She may be a candidate for TAVR when her disease progresses. She is sedentary during the day and has seen no changes in her fatigue level. Her dyspnea is felt to be a combination of her advanced lung disease, morbid obesity, deconditioning and AS.   I have reviewed the natural history of aortic stenosis with the patient and their family members  who are present today. We have discussed the limitations of medical therapy and the poor prognosis associated with symptomatic aortic stenosis. We have reviewed potential treatment options, including palliative medical therapy, conventional surgical aortic valve replacement, and transcatheter aortic valve replacement. We discussed treatment options in the context of the patient's specific comorbid medical conditions.   I will plan to repeat her echo in 6 months and I will see her after the  echo.   Current medicines are reviewed at length with the patient today.  The patient does not have concerns regarding medicines.  The following changes have been made:  no change  Labs/ tests ordered today include:  No orders of the defined types were placed in this encounter.    Disposition:   FU with me in 6 months.    Signed, Lauree Chandler, MD 12/11/2020 10:51 AM    [facility] [address], [place], [place]  [address] Phone: [phone]; Fax: [phone]

## 2020-12-01 ENCOUNTER — Institutional Professional Consult (permissible substitution): Payer: 59 | Admitting: Internal Medicine

## 2020-12-05 ENCOUNTER — Telehealth: Payer: Self-pay | Admitting: *Deleted

## 2020-12-05 NOTE — Telephone Encounter (Signed)
CALLED PATIENT TO INFORM OF CT FOR 12-19-20- ARRIVAL TIME- 11:15 AM @ [facility], NO RESTRICTIONS TO TEST, AND FU APPT. WITH DR. KINARD ON 12/29/20 FOR RESULTS, LVM FOR A RETURN CALL

## 2020-12-11 ENCOUNTER — Other Ambulatory Visit: Payer: Self-pay

## 2020-12-11 ENCOUNTER — Ambulatory Visit (INDEPENDENT_AMBULATORY_CARE_PROVIDER_SITE_OTHER): Payer: 59 | Admitting: Cardiovascular Disease

## 2020-12-11 ENCOUNTER — Encounter: Payer: Self-pay | Admitting: Cardiovascular Disease

## 2020-12-11 VITALS — BP 130/60 | HR 67 | Ht 62.0 in | Wt 238.6 lb

## 2020-12-11 DIAGNOSIS — I350 Nonrheumatic aortic (valve) stenosis: Secondary | ICD-10-CM | POA: Diagnosis not present

## 2020-12-11 NOTE — Patient Instructions (Addendum)
Medication Instructions:  *If you need a refill on your cardiac medications before your next appointment, please call your pharmacy*   Lab Work: If you have labs (blood work) drawn today and your tests are completely normal, you will receive your results only by: [place] (if you have MyChart) OR A paper copy in the mail If you have any lab test that is abnormal or we need to change your treatment, we will call you to review the results.   Testing/Procedures: Your physician has requested that you have an echocardiogram in [place] in December. Echocardiography is a painless test that uses sound waves to create images of your heart. It provides your doctor with information about the size and shape of your heart and how well your heart's chambers and valves are working. This procedure takes approximately one hour. There are no restrictions for this procedure.  Follow-Up: At [facility], you and your health needs are our priority.  As part of our continuing mission to provide you with exceptional heart care, we have created designated Provider Care Teams.  These Care Teams include your primary Cardiologist (physician) and Advanced Practice Providers (APPs -  Physician Assistants and Nurse Practitioners) who all work together to provide you with the care you need, when you need it.  We recommend signing up for the patient portal called "MyChart".  Sign up information is provided on this After Visit Summary.  MyChart is used to connect with patients for Virtual Visits (Telemedicine).  Patients are able to view lab/test results, encounter notes, upcoming appointments, etc.  Non-urgent messages can be sent to your provider as well.   To learn more about what you can do with MyChart, go to [email or web address].    Your next appointment:   6 month(s)  The format for your next appointment:   In Person  Provider:   You may see Dr. Angelena Form

## 2020-12-18 ENCOUNTER — Ambulatory Visit: Payer: 59 | Admitting: Radiation Oncology

## 2020-12-19 ENCOUNTER — Ambulatory Visit (HOSPITAL_COMMUNITY): Payer: 59

## 2020-12-22 ENCOUNTER — Emergency Department: Payer: 59

## 2020-12-22 ENCOUNTER — Ambulatory Visit: Payer: Self-pay | Admitting: Radiation Oncology

## 2020-12-22 ENCOUNTER — Inpatient Hospital Stay
Admission: EM | Admit: 2020-12-22 | Discharge: 2020-12-25 | DRG: 190 | Disposition: A | Discharge status: Home Health | Payer: 59 | Attending: Internal Medicine | Admitting: Internal Medicine

## 2020-12-22 ENCOUNTER — Telehealth: Payer: Self-pay | Admitting: *Deleted

## 2020-12-22 DIAGNOSIS — Z923 Personal history of irradiation: Secondary | ICD-10-CM | POA: Diagnosis not present

## 2020-12-22 DIAGNOSIS — N179 Acute kidney failure, unspecified: Secondary | ICD-10-CM | POA: Diagnosis present

## 2020-12-22 DIAGNOSIS — J9622 Acute and chronic respiratory failure with hypercapnia: Secondary | ICD-10-CM | POA: Diagnosis present

## 2020-12-22 DIAGNOSIS — I350 Nonrheumatic aortic (valve) stenosis: Secondary | ICD-10-CM | POA: Diagnosis present

## 2020-12-22 DIAGNOSIS — E119 Type 2 diabetes mellitus without complications: Secondary | ICD-10-CM

## 2020-12-22 DIAGNOSIS — E785 Hyperlipidemia, unspecified: Secondary | ICD-10-CM | POA: Diagnosis present

## 2020-12-22 DIAGNOSIS — J441 Chronic obstructive pulmonary disease with (acute) exacerbation: Secondary | ICD-10-CM | POA: Diagnosis present

## 2020-12-22 DIAGNOSIS — Z79890 Hormone replacement therapy (postmenopausal): Secondary | ICD-10-CM

## 2020-12-22 DIAGNOSIS — K219 Gastro-esophageal reflux disease without esophagitis: Secondary | ICD-10-CM | POA: Diagnosis present

## 2020-12-22 DIAGNOSIS — T380X5A Adverse effect of glucocorticoids and synthetic analogues, initial encounter: Secondary | ICD-10-CM | POA: Diagnosis present

## 2020-12-22 DIAGNOSIS — Z79899 Other long term (current) drug therapy: Secondary | ICD-10-CM

## 2020-12-22 DIAGNOSIS — I110 Hypertensive heart disease with heart failure: Secondary | ICD-10-CM | POA: Diagnosis present

## 2020-12-22 DIAGNOSIS — E1142 Type 2 diabetes mellitus with diabetic polyneuropathy: Secondary | ICD-10-CM | POA: Diagnosis present

## 2020-12-22 DIAGNOSIS — D509 Iron deficiency anemia, unspecified: Secondary | ICD-10-CM | POA: Diagnosis present

## 2020-12-22 DIAGNOSIS — Z20822 Contact with and (suspected) exposure to covid-19: Secondary | ICD-10-CM | POA: Diagnosis present

## 2020-12-22 DIAGNOSIS — Z7901 Long term (current) use of anticoagulants: Secondary | ICD-10-CM

## 2020-12-22 DIAGNOSIS — E1151 Type 2 diabetes mellitus with diabetic peripheral angiopathy without gangrene: Secondary | ICD-10-CM | POA: Diagnosis present

## 2020-12-22 DIAGNOSIS — D72829 Elevated white blood cell count, unspecified: Secondary | ICD-10-CM | POA: Diagnosis present

## 2020-12-22 DIAGNOSIS — Z888 Allergy status to other drugs, medicaments and biological substances status: Secondary | ICD-10-CM

## 2020-12-22 DIAGNOSIS — F419 Anxiety disorder, unspecified: Secondary | ICD-10-CM | POA: Diagnosis present

## 2020-12-22 DIAGNOSIS — E039 Hypothyroidism, unspecified: Secondary | ICD-10-CM | POA: Diagnosis present

## 2020-12-22 DIAGNOSIS — I5033 Acute on chronic diastolic (congestive) heart failure: Secondary | ICD-10-CM | POA: Diagnosis present

## 2020-12-22 DIAGNOSIS — D638 Anemia in other chronic diseases classified elsewhere: Secondary | ICD-10-CM | POA: Diagnosis present

## 2020-12-22 DIAGNOSIS — F32A Depression, unspecified: Secondary | ICD-10-CM | POA: Diagnosis present

## 2020-12-22 DIAGNOSIS — E872 Acidosis: Secondary | ICD-10-CM | POA: Diagnosis present

## 2020-12-22 DIAGNOSIS — F17210 Nicotine dependence, cigarettes, uncomplicated: Secondary | ICD-10-CM | POA: Diagnosis present

## 2020-12-22 DIAGNOSIS — J9601 Acute respiratory failure with hypoxia: Secondary | ICD-10-CM | POA: Diagnosis present

## 2020-12-22 DIAGNOSIS — G4733 Obstructive sleep apnea (adult) (pediatric): Secondary | ICD-10-CM | POA: Diagnosis present

## 2020-12-22 DIAGNOSIS — I509 Heart failure, unspecified: Secondary | ICD-10-CM

## 2020-12-22 DIAGNOSIS — Z8249 Family history of ischemic heart disease and other diseases of the circulatory system: Secondary | ICD-10-CM

## 2020-12-22 DIAGNOSIS — I4891 Unspecified atrial fibrillation: Secondary | ICD-10-CM

## 2020-12-22 DIAGNOSIS — J9602 Acute respiratory failure with hypercapnia: Secondary | ICD-10-CM

## 2020-12-22 DIAGNOSIS — E1165 Type 2 diabetes mellitus with hyperglycemia: Secondary | ICD-10-CM | POA: Diagnosis present

## 2020-12-22 DIAGNOSIS — R269 Unspecified abnormalities of gait and mobility: Secondary | ICD-10-CM | POA: Diagnosis present

## 2020-12-22 DIAGNOSIS — Z85118 Personal history of other malignant neoplasm of bronchus and lung: Secondary | ICD-10-CM

## 2020-12-22 DIAGNOSIS — J9621 Acute and chronic respiratory failure with hypoxia: Secondary | ICD-10-CM | POA: Diagnosis present

## 2020-12-22 DIAGNOSIS — Z6841 Body Mass Index (BMI) 40.0 and over, adult: Secondary | ICD-10-CM

## 2020-12-22 DIAGNOSIS — R0602 Shortness of breath: Secondary | ICD-10-CM | POA: Diagnosis present

## 2020-12-22 DIAGNOSIS — J45901 Unspecified asthma with (acute) exacerbation: Secondary | ICD-10-CM | POA: Diagnosis present

## 2020-12-22 DIAGNOSIS — I480 Paroxysmal atrial fibrillation: Secondary | ICD-10-CM | POA: Diagnosis present

## 2020-12-22 DIAGNOSIS — Z809 Family history of malignant neoplasm, unspecified: Secondary | ICD-10-CM

## 2020-12-22 DIAGNOSIS — Z794 Long term (current) use of insulin: Secondary | ICD-10-CM

## 2020-12-22 DIAGNOSIS — G2581 Restless legs syndrome: Secondary | ICD-10-CM | POA: Diagnosis present

## 2020-12-22 LAB — CBC WITH DIFFERENTIAL/PLATELET
Abs Immature Granulocytes: 0.07 10*3/uL (ref 0.00–0.07)
Basophils Absolute: 0.1 10*3/uL (ref 0.0–0.1)
Basophils Relative: 0 %
Eosinophils Absolute: 0.4 10*3/uL (ref 0.0–0.5)
Eosinophils Relative: 3 %
HCT: 34.7 % — ABNORMAL LOW (ref 36.0–46.0)
Hemoglobin: 10.6 g/dL — ABNORMAL LOW (ref 12.0–15.0)
Immature Granulocytes: 1 %
Lymphocytes Relative: 12 %
Lymphs Abs: 1.6 10*3/uL (ref 0.7–4.0)
MCH: 29.7 pg (ref 26.0–34.0)
MCHC: 30.5 g/dL (ref 30.0–36.0)
MCV: 97.2 fL (ref 80.0–100.0)
Monocytes Absolute: 0.8 10*3/uL (ref 0.1–1.0)
Monocytes Relative: 6 %
Neutro Abs: 11.1 10*3/uL — ABNORMAL HIGH (ref 1.7–7.7)
Neutrophils Relative %: 78 %
Platelets: 277 10*3/uL (ref 150–400)
RBC: 3.57 MIL/uL — ABNORMAL LOW (ref 3.87–5.11)
RDW: 15.7 % — ABNORMAL HIGH (ref 11.5–15.5)
WBC: 14 10*3/uL — ABNORMAL HIGH (ref 4.0–10.5)
nRBC: 0 % (ref 0.0–0.2)

## 2020-12-22 LAB — COMPREHENSIVE METABOLIC PANEL
ALT: 17 U/L (ref 0–44)
AST: 24 U/L (ref 15–41)
Albumin: 3.6 g/dL (ref 3.5–5.0)
Alkaline Phosphatase: 36 U/L — ABNORMAL LOW (ref 38–126)
Anion gap: 8 (ref 5–15)
BUN: 32 mg/dL — ABNORMAL HIGH (ref 8–23)
CO2: 33 mmol/L — ABNORMAL HIGH (ref 22–32)
Calcium: 8.7 mg/dL — ABNORMAL LOW (ref 8.9–10.3)
Chloride: 100 mmol/L (ref 98–111)
Creatinine, Ser: 1.78 mg/dL — ABNORMAL HIGH (ref 0.44–1.00)
GFR, Estimated: 31 mL/min — ABNORMAL LOW (ref >60)
Glucose, Bld: 181 mg/dL — ABNORMAL HIGH (ref 70–99)
Potassium: 4.4 mmol/L (ref 3.5–5.1)
Sodium: 141 mmol/L (ref 135–145)
Total Bilirubin: 0.6 mg/dL (ref 0.3–1.2)
Total Protein: 7.3 g/dL (ref 6.5–8.1)

## 2020-12-22 LAB — CBG MONITORING, ED: Glucose-Capillary: 179 mg/dL — ABNORMAL HIGH (ref 70–99)

## 2020-12-22 LAB — BRAIN NATRIURETIC PEPTIDE: B Natriuretic Peptide: 647.6 pg/mL — ABNORMAL HIGH (ref 0.0–100.0)

## 2020-12-22 LAB — BLOOD GAS, VENOUS
Acid-Base Excess: 5.5 mmol/L — ABNORMAL HIGH (ref 0.0–2.0)
Acid-Base Excess: 11.9 mmol/L — ABNORMAL HIGH (ref 0.0–2.0)
Bicarbonate: 35.2 mmol/L — ABNORMAL HIGH (ref 20.0–28.0)
Bicarbonate: 39.7 mmol/L — ABNORMAL HIGH (ref 20.0–28.0)
Delivery systems: POSITIVE
Delivery systems: POSITIVE
FIO2: 0.45
FIO2: 0.35
O2 Saturation: 85.9 %
O2 Saturation: 86.5 %
Patient temperature: 37
Patient temperature: 37
pCO2, Ven: 84 mmHg (ref 44.0–60.0)
pCO2, Ven: 72 mmHg (ref 44.0–60.0)
pH, Ven: 7.23 — ABNORMAL LOW (ref 7.250–7.430)
pH, Ven: 7.35 (ref 7.250–7.430)
pO2, Ven: 61 mmHg — ABNORMAL HIGH (ref 32.0–45.0)
pO2, Ven: 55 mmHg — ABNORMAL HIGH (ref 32.0–45.0)

## 2020-12-22 LAB — RESP PANEL BY RT-PCR (FLU A&B, COVID) ARPGX2
Influenza A by PCR: NEGATIVE
Influenza B by PCR: NEGATIVE
SARS Coronavirus 2 by RT PCR: NEGATIVE

## 2020-12-22 LAB — HIV ANTIBODY (ROUTINE TESTING W REFLEX): HIV Screen 4th Generation wRfx: NONREACTIVE

## 2020-12-22 LAB — TROPONIN I (HIGH SENSITIVITY)
Troponin I (High Sensitivity): 10 ng/L (ref <18)
Troponin I (High Sensitivity): 26 ng/L — ABNORMAL HIGH (ref <18)

## 2020-12-22 MED ORDER — ONDANSETRON HCL 4 MG/2ML IJ SOLN
4.0000 mg | Freq: Four times a day (QID) | INTRAMUSCULAR | Status: DC | PRN
  Administered 2020-12-22: 4 mg via INTRAVENOUS
  Filled 2020-12-22: qty 2

## 2020-12-22 MED ORDER — INSULIN ASPART 100 UNIT/ML IJ SOLN
0.0000 [IU] | Freq: Every day | INTRAMUSCULAR | Status: DC
  Filled 2020-12-22: qty 1

## 2020-12-22 MED ORDER — ENOXAPARIN SODIUM 60 MG/0.6ML IJ SOSY: 0.5000 mg/kg | PREFILLED_SYRINGE | INTRAMUSCULAR | Status: DC

## 2020-12-22 MED ORDER — FERROUS SULFATE 325 (65 FE) MG PO TABS
325.0000 mg | ORAL_TABLET | Freq: Every day | ORAL | Status: DC
  Administered 2020-12-23 – 2020-12-25 (×3): 325 mg via ORAL
  Filled 2020-12-22 (×3): qty 1

## 2020-12-22 MED ORDER — SODIUM CHLORIDE 0.9 % IV SOLN
2.0000 g | INTRAVENOUS | Status: DC
  Administered 2020-12-22 – 2020-12-23 (×2): 2 g via INTRAVENOUS
  Filled 2020-12-22 (×3): qty 20

## 2020-12-22 MED ORDER — POTASSIUM CHLORIDE CRYS ER 20 MEQ PO TBCR
20.0000 meq | EXTENDED_RELEASE_TABLET | Freq: Every day | ORAL | Status: DC
  Administered 2020-12-23 – 2020-12-25 (×3): 20 meq via ORAL
  Filled 2020-12-22 (×3): qty 1

## 2020-12-22 MED ORDER — POLYETHYLENE GLYCOL 3350 17 G PO PACK: 17.0000 g | PACK | Freq: Every day | ORAL | Status: DC | PRN

## 2020-12-22 MED ORDER — IPRATROPIUM-ALBUTEROL 0.5-2.5 (3) MG/3ML IN SOLN
3.0000 mL | Freq: Four times a day (QID) | RESPIRATORY_TRACT | Status: DC
  Administered 2020-12-22 – 2020-12-25 (×11): 3 mL via RESPIRATORY_TRACT
  Filled 2020-12-22 (×12): qty 3

## 2020-12-22 MED ORDER — OXYCODONE HCL 5 MG PO TABS
10.0000 mg | ORAL_TABLET | Freq: Three times a day (TID) | ORAL | Status: DC | PRN
  Administered 2020-12-23: 10 mg via ORAL
  Filled 2020-12-22: qty 2

## 2020-12-22 MED ORDER — SODIUM CHLORIDE 0.9 % IV SOLN: 1.0000 g | INTRAVENOUS | Status: DC

## 2020-12-22 MED ORDER — GABAPENTIN 600 MG PO TABS
300.0000 mg | ORAL_TABLET | Freq: Two times a day (BID) | ORAL | Status: DC
  Administered 2020-12-22 – 2020-12-25 (×6): 300 mg via ORAL
  Filled 2020-12-22 (×6): qty 1

## 2020-12-22 MED ORDER — OXYCODONE-ACETAMINOPHEN 10-325 MG PO TABS: 1.0000 | ORAL_TABLET | Freq: Three times a day (TID) | ORAL | Status: DC | PRN

## 2020-12-22 MED ORDER — INSULIN ASPART 100 UNIT/ML IJ SOLN
0.0000 [IU] | Freq: Three times a day (TID) | INTRAMUSCULAR | Status: DC
  Administered 2020-12-23 (×2): 2 [IU] via SUBCUTANEOUS
  Administered 2020-12-24: 3 [IU] via SUBCUTANEOUS
  Administered 2020-12-24 – 2020-12-25 (×2): 1 [IU] via SUBCUTANEOUS
  Filled 2020-12-22 (×4): qty 1

## 2020-12-22 MED ORDER — ATORVASTATIN CALCIUM 80 MG PO TABS
80.0000 mg | ORAL_TABLET | Freq: Every day | ORAL | Status: DC
  Administered 2020-12-22 – 2020-12-24 (×3): 80 mg via ORAL
  Filled 2020-12-22: qty 1
  Filled 2020-12-22: qty 4
  Filled 2020-12-22: qty 1

## 2020-12-22 MED ORDER — METOPROLOL TARTRATE 5 MG/5ML IV SOLN: 2.5000 mg | Freq: Four times a day (QID) | INTRAVENOUS | Status: DC | PRN

## 2020-12-22 MED ORDER — MOMETASONE FURO-FORMOTEROL FUM 200-5 MCG/ACT IN AERO
2.0000 | INHALATION_SPRAY | Freq: Two times a day (BID) | RESPIRATORY_TRACT | Status: DC
  Administered 2020-12-24 – 2020-12-25 (×3): 2 via RESPIRATORY_TRACT
  Filled 2020-12-22 (×2): qty 8.8

## 2020-12-22 MED ORDER — ENOXAPARIN SODIUM 40 MG/0.4ML IJ SOSY: 40.0000 mg | PREFILLED_SYRINGE | INTRAMUSCULAR | Status: DC

## 2020-12-22 MED ORDER — FAMOTIDINE 20 MG PO TABS
40.0000 mg | ORAL_TABLET | Freq: Every day | ORAL | Status: DC
  Administered 2020-12-23 – 2020-12-25 (×3): 40 mg via ORAL
  Filled 2020-12-22 (×3): qty 2

## 2020-12-22 MED ORDER — EZETIMIBE 10 MG PO TABS
10.0000 mg | ORAL_TABLET | Freq: Every day | ORAL | Status: DC
  Administered 2020-12-23 – 2020-12-25 (×3): 10 mg via ORAL
  Filled 2020-12-22 (×3): qty 1

## 2020-12-22 MED ORDER — SENNOSIDES-DOCUSATE SODIUM 8.6-50 MG PO TABS
1.0000 | ORAL_TABLET | Freq: Every day | ORAL | Status: DC
  Administered 2020-12-22 – 2020-12-24 (×2): 1 via ORAL
  Filled 2020-12-22 (×3): qty 1

## 2020-12-22 MED ORDER — PANTOPRAZOLE SODIUM 40 MG PO TBEC
40.0000 mg | DELAYED_RELEASE_TABLET | Freq: Two times a day (BID) | ORAL | Status: DC
  Administered 2020-12-22 – 2020-12-25 (×6): 40 mg via ORAL
  Filled 2020-12-22 (×6): qty 1

## 2020-12-22 MED ORDER — CITALOPRAM HYDROBROMIDE 20 MG PO TABS
40.0000 mg | ORAL_TABLET | Freq: Every day | ORAL | Status: DC
  Administered 2020-12-23 – 2020-12-25 (×3): 40 mg via ORAL
  Filled 2020-12-22 (×3): qty 2

## 2020-12-22 MED ORDER — ACETAMINOPHEN 325 MG PO TABS
325.0000 mg | ORAL_TABLET | Freq: Three times a day (TID) | ORAL | Status: DC | PRN
Start: 2020-12-22 — End: 2020-12-25
  Administered 2020-12-23: 325 mg via ORAL
  Filled 2020-12-22: qty 1

## 2020-12-22 MED ORDER — FUROSEMIDE 10 MG/ML IJ SOLN
40.0000 mg | Freq: Two times a day (BID) | INTRAMUSCULAR | Status: DC
  Administered 2020-12-22 – 2020-12-23 (×2): 40 mg via INTRAVENOUS
  Filled 2020-12-22 (×2): qty 4

## 2020-12-22 MED ORDER — LEVOTHYROXINE SODIUM 100 MCG PO TABS
100.0000 ug | ORAL_TABLET | Freq: Every day | ORAL | Status: DC
  Administered 2020-12-23 – 2020-12-25 (×3): 100 ug via ORAL
  Filled 2020-12-22 (×2): qty 1
  Filled 2020-12-22: qty 2

## 2020-12-22 MED ORDER — IPRATROPIUM-ALBUTEROL 0.5-2.5 (3) MG/3ML IN SOLN
9.0000 mL | Freq: Once | RESPIRATORY_TRACT | Status: AC
  Administered 2020-12-22: 9 mL via RESPIRATORY_TRACT

## 2020-12-22 MED ORDER — NICOTINE 21 MG/24HR TD PT24
21.0000 mg | MEDICATED_PATCH | Freq: Every day | TRANSDERMAL | Status: DC
  Administered 2020-12-22 – 2020-12-25 (×4): 21 mg via TRANSDERMAL
  Filled 2020-12-22 (×5): qty 1

## 2020-12-22 MED ORDER — MELATONIN 5 MG PO TABS
2.5000 mg | ORAL_TABLET | Freq: Every evening | ORAL | Status: DC | PRN
  Administered 2020-12-23 – 2020-12-25 (×2): 2.5 mg via ORAL
  Filled 2020-12-22 (×2): qty 1

## 2020-12-22 MED ORDER — FUROSEMIDE 10 MG/ML IJ SOLN
40.0000 mg | Freq: Once | INTRAMUSCULAR | Status: AC
  Administered 2020-12-22: 40 mg via INTRAVENOUS
  Filled 2020-12-22: qty 4

## 2020-12-22 MED ORDER — METHYLPREDNISOLONE SODIUM SUCC 40 MG IJ SOLR
40.0000 mg | Freq: Two times a day (BID) | INTRAMUSCULAR | Status: DC
  Administered 2020-12-22 – 2020-12-24 (×5): 40 mg via INTRAVENOUS
  Filled 2020-12-22 (×5): qty 1

## 2020-12-22 MED ORDER — GUAIFENESIN-DM 100-10 MG/5ML PO SYRP: 5.0000 mL | ORAL_SOLUTION | ORAL | Status: DC | PRN

## 2020-12-22 MED ORDER — FENOFIBRATE 160 MG PO TABS
160.0000 mg | ORAL_TABLET | Freq: Every day | ORAL | Status: DC
  Administered 2020-12-23 – 2020-12-25 (×3): 160 mg via ORAL
  Filled 2020-12-22 (×3): qty 1

## 2020-12-22 MED ORDER — APIXABAN 5 MG PO TABS
5.0000 mg | ORAL_TABLET | Freq: Two times a day (BID) | ORAL | Status: DC
  Administered 2020-12-22 – 2020-12-25 (×6): 5 mg via ORAL
  Filled 2020-12-22 (×6): qty 1

## 2020-12-22 NOTE — Progress Notes (Signed)
PHARMACIST - PHYSICIAN COMMUNICATION  CONCERNING:  Enoxaparin (Lovenox) for DVT Prophylaxis    RECOMMENDATION: Patient was prescribed enoxaprin 40mg  q24 hours for VTE prophylaxis.   Filed Weights   12/22/20 1014  Weight: 111.4 kg (245 lb 9.6 oz)    Body mass index is 44.92 kg/m.  Estimated Creatinine Clearance: 35.6 mL/min (A) (by C-G formula based on SCr of 1.78 mg/dL (H)).   Based on [place] patient is candidate for enoxaparin 0.5mg /kg TBW SQ every 24 hours based on BMI being >30.  DESCRIPTION: Pharmacy has adjusted enoxaparin dose per [facility] policy.  Patient is now receiving enoxaparin 55 mg every 24 hours    Berta Minor, PharmD Clinical Pharmacist  12/22/2020 12:26 PM

## 2020-12-22 NOTE — ED Notes (Signed)
Patient's HR sustained rate of 142. Patient states she feels a "little short of breath", but denies palpitations.   ED MD aware of patient's HR. Admit MD aware of patient's HR.  EKG performed.   Patient eating dinner at this time.

## 2020-12-22 NOTE — ED Triage Notes (Addendum)
Pt BIBA from home. Pt c/o increased SHOB. Pt is a smoker, hx of COPD. Upon fire arrival, pt satting at 60% on RA, improved to 90% on BiPap with EMS. Pt received 3 duonebs, 1 albuterol, 125 solumedrol, 2mg  mag en route.

## 2020-12-22 NOTE — ED Provider Notes (Signed)
[facility] Emergency Department Provider Note  ____________________________________________  Time seen: Approximately 11:38 AM  I have reviewed the triage vital signs and the nursing notes.   HISTORY  Chief Complaint Shortness of Breath    HPI Kristin [name] is a 70 y.o. female with a history of COPD, CHF, diabetes, hypertension, GERD who was brought to the ED due to respiratory distress.  Been having worsening shortness of breath since last night.  Reportedly according to daughter the patient was smoking heavily yesterday.  EMS gave 4 nebs, 125 mg of Solu-Medrol, 2 mg of IV magnesium during transport.  On CPAP for EMS, oxygen saturation increased from 60% on room air to about 90%.  Symptoms are constant, severe, no aggravating or alleviating factors.  Denies chest pain.    Past Medical History:  Diagnosis Date  . Anxiety   . Asthma   . CHF (congestive heart failure) ([place])    2018  . COPD (chronic obstructive pulmonary disease) ([place])   . Diabetes mellitus without complication ([place])   . Dyspnea   . GERD (gastroesophageal reflux disease)   . History of hiatal hernia   . History of orthopnea   . History of radiation therapy 07/29/20-08/08/20   Right Lung, SBRT Dr. Gery Pray  . Hypothyroidism   . Neuropathy   . Oxygen deficiency    3L/HS  . Pain    BACK/DDD  . Peripheral vascular disease ([place])   . RLS (restless legs syndrome)   . Sleep apnea   . Wheezing      Patient Active Problem List   Diagnosis Date Noted  . Aspiration into airway   . Paroxysmal atrial fibrillation (HCC)   . CAD (coronary artery disease) 09/26/2020  . Hyperkalemia 09/26/2020  . CHF exacerbation ([place]) 09/26/2020  . Acute on chronic respiratory failure with hypoxia and hypercapnia ([place]) 09/25/2020  . Bradycardia 09/25/2020  . Lung cancer ([place]) 05/26/2020  . Atrial flutter with rapid ventricular response ([place]) 05/26/2020  . Demand ischemia ([place])   . Acute on chronic  heart failure with preserved ejection fraction (HFpEF) ([place])   . Aortic stenosis-moderate to severe 03/2020   . Obesity, Class III, BMI 40-49.9 (morbid obesity) ([place]) 03/29/2020  . Chronic respiratory failure with hypoxia ([place]) 03/29/2020  . OSA (obstructive sleep apnea) 03/29/2020  . Essential hypertension 03/29/2020  . Hypothyroidism 03/29/2020  . Chest pain 03/29/2020  . Elevated troponin 03/29/2020  . Chronic diastolic (congestive) heart failure ([place]) 07/12/2018  . Type 2 diabetes mellitus without complication ([place]) 58/85/0277  . COPD exacerbation ([place]) 07/12/2018  . Tobacco use 07/12/2018  . Hypotension 07/12/2018  . Respiratory failure ([place]) 07/04/2018     Past Surgical History:  Procedure Laterality Date  . BREAST SURGERY    . CATARACT EXTRACTION W/PHACO Right 03/02/2018   Procedure: CATARACT EXTRACTION PHACO AND INTRAOCULAR LENS PLACEMENT ([place]);  Surgeon: Marchia Meiers, MD;  Location: [facility] ORS;  Service: Ophthalmology;  Laterality: Right;  [place] 01:15 CDE 16.46 Fluid pack lot # [id number]  . CATARACT EXTRACTION W/PHACO Left 04/27/2018   Procedure: CATARACT EXTRACTION PHACO AND INTRAOCULAR LENS PLACEMENT ([place])- LEFT DIABETIC;  Surgeon: Marchia Meiers, MD;  Location: [facility] ORS;  Service: Ophthalmology;  Laterality: Left;  Lot # [id number] H [place]: 00:46.3 CDE: 8.07   . CYST EXCISION     FOREHEAD  . FOOT SURGERY     CYST  . RIGHT/LEFT HEART CATH AND CORONARY ANGIOGRAPHY N/A 03/31/2020   Procedure: RIGHT/LEFT HEART CATH AND CORONARY ANGIOGRAPHY;  Surgeon: Fletcher Anon,  Mertie Clause, MD;  Location: [facility];  Service: Cardiovascular;  Laterality: N/A;  . TUBAL LIGATION       Prior to Admission medications   Medication Sig Start Date End Date Taking? Authorizing Provider  albuterol (VENTOLIN HFA) 108 (90 Base) MCG/ACT inhaler Inhale 2 puffs into the lungs every 4 (four) hours as needed for wheezing or shortness of breath.     [provider]  amiodarone (PACERONE) 200 MG  tablet Take 1 tablet (200 mg total) by mouth daily. 11/03/20   Minna Merritts, MD  apixaban (ELIQUIS) 5 MG TABS tablet Take 1 tablet (5 mg total) by mouth 2 (two) times daily. 11/03/20   Minna Merritts, MD  atorvastatin (LIPITOR) 80 MG tablet Take 1 tablet (80 mg total) by mouth at bedtime. 11/03/20   Minna Merritts, MD  carvedilol (COREG) 3.125 MG tablet Take 1 tablet (3.125 mg total) by mouth 2 (two) times daily. 11/03/20   Minna Merritts, MD  citalopram (CELEXA) 40 MG tablet Take 40 mg by mouth daily. 06/14/20   [provider]  diclofenac Sodium (VOLTAREN) 1 % GEL Apply 2 g topically 4 (four) times daily as needed.    [provider]  docusate sodium (COLACE) 100 MG capsule Take 100 mg by mouth at bedtime as needed. 03/19/20   [provider]  ezetimibe (ZETIA) 10 MG tablet Take 1 tablet (10 mg total) by mouth daily. 11/03/20 10/29/21  Minna Merritts, MD  famotidine (PEPCID) 40 MG tablet Take 40 mg by mouth daily.    [provider]  fenofibrate 160 MG tablet Take 160 mg by mouth daily.    [provider]  ferrous sulfate 325 (65 FE) MG tablet Take 325 mg by mouth daily. 06/14/20   [provider]  furosemide (LASIX) 20 MG tablet Take 20 mg by mouth every other day. Alternate with 40 mg    [provider]  furosemide (LASIX) 40 MG tablet Take 40 mg by mouth every other day. Alternating with 20 mg    [provider]  gabapentin (NEURONTIN) 600 MG tablet Take 600 mg by mouth 2 (two) times daily.  11/20/19   [provider]  hydrOXYzine (VISTARIL) 50 MG capsule Take 50 mg by mouth every 6 (six) hours as needed for anxiety. 03/14/20   [provider]  ipratropium-albuterol (DUONEB) 0.5-2.5 (3) MG/3ML SOLN Take 3 mLs by nebulization 4 (four) times daily. 08/07/20   [provider]  irbesartan (AVAPRO) 300 MG tablet Take 0.5 tablets (150 mg total) by mouth daily. 06/30/20 06/25/21  Loel Dubonnet, NP   levothyroxine (SYNTHROID) 100 MCG tablet Take by mouth.    [provider]  lidocaine (LIDODERM) 5 % 3 patches daily. 05/28/20   [provider]  lidocaine (LIDODERM) 5 % Apply topically. 05/28/20   [provider]  losartan (COZAAR) 25 MG tablet Take 25 mg by mouth daily. 04/02/20   [provider]  Melatonin 10 MG TABS Take 10 mg by mouth at bedtime as needed (sleep).    [provider]  metFORMIN (GLUCOPHAGE) 500 MG tablet Take 500 mg by mouth 2 (two) times daily. 09/19/19   [provider]  MOVANTIK 25 MG TABS tablet Take 25 mg by mouth daily. 07/21/20   [provider]  nicotine (NICODERM CQ - DOSED IN MG/24 HOURS) 21 mg/24hr patch Place 1 patch (21 mg total) onto the skin daily. 08/15/20   Marrianne Mood D, PA-C  oxyCODONE-acetaminophen (PERCOCET) 10-325 MG tablet Take 1 tablet by mouth 4 (four) times daily as needed. 10/16/20   [provider]  pantoprazole (PROTONIX) 40 MG tablet Take 40 mg by mouth 2 (two) times daily. 11/11/20   [provider]  sitaGLIPtin (JANUVIA) 100 MG tablet Take 100 mg by mouth daily.    [provider]  Tiotropium Bromide Monohydrate 2.5 MCG/ACT AERS Inhale 2 puffs into the lungs daily.     [provider]  Torsemide 40 MG TABS Take 40 mg by mouth every other day. Alternating with 20 mg by mouth every other day 11/03/20   Minna Merritts, MD  Vitamin D, Ergocalciferol, (DRISDOL) 1.25 MG (50000 UNIT) CAPS capsule Take 1 capsule by mouth once a week. 08/14/20   [provider]     Allergies Altace [ramipril]   Family History  Problem Relation Age of Onset  . Heart disease Mother   . Cancer Father     Social History Social History   Tobacco Use  . Smoking status: Former    Packs/day: 1.00    Years: 50.00    Pack years: 50.00    Types: Cigarettes  . Smokeless tobacco: Never  Vaping Use  . Vaping Use: Never used  Substance Use Topics  . Alcohol  use: Not Currently    Review of Systems  Constitutional:   No fever or chills.  ENT:   No sore throat. No rhinorrhea. Cardiovascular:   No chest pain or syncope. Respiratory:   Positive shortness of breath . Gastrointestinal:   Negative for abdominal pain, vomiting and diarrhea.  Musculoskeletal:   Negative for focal pain or swelling All other systems reviewed and are negative except as documented above in ROS and HPI.  ____________________________________________   PHYSICAL EXAM:  VITAL SIGNS: ED Triage Vitals  Enc Vitals Group     BP 12/22/20 1014 (!) 152/65     Pulse Rate 12/22/20 1014 (!) 58     Resp 12/22/20 1014 (!) 22     Temp 12/22/20 1014 98.4 F (36.9 C)     Temp Source 12/22/20 1040 Axillary     SpO2 12/22/20 1014 98 %     Weight 12/22/20 1014 245 lb 9.6 oz (111.4 kg)     Height 12/22/20 1014 5\' 2"  (1.575 m)     Head Circumference --      Peak Flow --      Pain Score 12/22/20 1014 0     Pain Loc --      Pain Edu? --      Excl. in [place]? --     Vital signs reviewed, nursing assessments reviewed.   Constitutional:   Alert and oriented.  Respiratory distress. Eyes:   Conjunctivae are normal. EOMI. PERRL. ENT      Head:   Normocephalic and atraumatic.      Nose:   Normal      Mouth/Throat:   Normal      Neck:   No meningismus. Full ROM. Hematological/Lymphatic/Immunilogical:   No cervical lymphadenopathy. Cardiovascular:   RRR. Symmetric bilateral radial and DP pulses.  No murmurs. Cap refill less than 2 seconds. Respiratory:   Tachypnea with respiratory rate of 30.  Diffuse expiratory wheezing and prolonged expiratory phase.  No focal crackles Gastrointestinal:   Soft and nontender. Non distended. There is no CVA tenderness.  No rebound, rigidity, or guarding. Genitourinary:   deferred Musculoskeletal:   Normal range of motion in all extremities. No joint effusions.  No lower extremity tenderness.  1+ bilateral pitting edema. Neurologic:   Normal speech and  language.  Motor grossly intact. No acute focal neurologic deficits are appreciated.  Skin:    Skin is warm, dry and intact. No rash noted.  No petechiae, purpura, or bullae.  ____________________________________________    LABS (pertinent positives/negatives) (all labs ordered are listed, but only abnormal results are displayed) Labs Reviewed  COMPREHENSIVE METABOLIC PANEL - Abnormal; Notable for the following components:      Result Value   CO2 33 (*)    Glucose, Bld 181 (*)    BUN 32 (*)    Creatinine, Ser 1.78 (*)    Calcium 8.7 (*)    Alkaline Phosphatase 36 (*)    GFR, Estimated 31 (*)    All other components within normal limits  BRAIN NATRIURETIC PEPTIDE - Abnormal; Notable for the following components:   B Natriuretic Peptide 647.6 (*)    All other components within normal limits  CBC WITH DIFFERENTIAL/PLATELET - Abnormal; Notable for the following components:   WBC 14.0 (*)    RBC 3.57 (*)    Hemoglobin 10.6 (*)    HCT 34.7 (*)    RDW 15.7 (*)    Neutro Abs 11.1 (*)    All other components within normal limits  BLOOD GAS, VENOUS - Abnormal; Notable for the following components:   pH, Ven 7.23 (*)    pCO2, Ven 84 (*)    pO2, Ven 61.0 (*)    Bicarbonate 35.2 (*)    Acid-Base Excess 5.5 (*)    All other components within normal limits  RESP PANEL BY RT-PCR (FLU A&B, COVID) ARPGX2  TROPONIN I (HIGH SENSITIVITY)   ____________________________________________   EKG  Interpreted by me Sinus rhythm rate of 55, normal axis and intervals.  Normal QRS ST segments and T waves.  No ischemic changes.  ____________________________________________    RADIOLOGY  DG Chest Portable 1 View  Result Date: 12/22/2020 CLINICAL DATA:  Respiratory distress, COPD EXAM: PORTABLE CHEST 1 VIEW COMPARISON:  10/29/2020 FINDINGS: Chronic interstitial changes with suspected superimposed edema. No significant pleural effusion. Stable cardiomediastinal contours. No pneumothorax.  IMPRESSION: Chronic interstitial changes with suspected superimposed edema. Electronically Signed   By: Macy Mis M.D.   On: 12/22/2020 10:43    ____________________________________________   PROCEDURES .Critical Care  Date/Time: 12/22/2020 11:42 AM Performed by: Carrie Mew, MD Authorized by: Carrie Mew, MD   Critical care provider statement:    Critical care time (minutes):  35   Critical care time was exclusive of:  Separately billable procedures and treating other patients   Critical care was necessary to treat or prevent imminent or life-threatening deterioration of the following conditions:  Respiratory failure   Critical care was time spent personally by me on the following activities:  Development of treatment plan with patient or surrogate, discussions with consultants, evaluation of patient's response to treatment, examination of patient, obtaining history from patient or surrogate, ordering and performing treatments and interventions, ordering and review of laboratory studies, ordering and review of radiographic studies, pulse oximetry, re-evaluation of patient's condition and review of old charts  ____________________________________________  DIFFERENTIAL DIAGNOSIS   COPD exacerbation, non-STEMI, pleural effusion, pulmonary edema  CLINICAL IMPRESSION / ASSESSMENT AND PLAN / ED COURSE  Medications ordered in the ED: Medications  ipratropium-albuterol (DUONEB) 0.5-2.5 (3) MG/3ML nebulizer solution 9 mL (9 mLs Nebulization Given by Other 12/22/20 1023)  furosemide (LASIX) injection 40 mg (40 mg Intravenous Given 12/22/20 1103)  Pertinent labs & imaging results that were available during my care of the patient were reviewed by me and considered in my medical decision making (see chart for details).  Kristin [name] was evaluated in Emergency Department on 12/22/2020 for the symptoms described in the history of present illness. She was evaluated in the context of  the global COVID-19 pandemic, which necessitated consideration that the patient might be at risk for infection with the SARS-CoV-2 virus that causes COVID-19. Institutional protocols and algorithms that pertain to the evaluation of patients at risk for COVID-19 are in a state of rapid change based on information released by regulatory bodies including the CDC and federal and state organizations. These policies and algorithms were followed during the patient's care in the ED.     Clinical Course as of 12/22/20 1231  Mon Dec 22, 2020  1013 With history of COPD comes to the ED in respiratory distress due to worsening shortness of breath over the last 24 hours.  This is in the context of heavy smoking yesterday.  On CPAP by EMS, placed on BiPAP on arrival.  May need to intubate if not improving quickly.  Will check labs chest x-ray and COVID test. [PS]  1103 Patient much more comfortable on BiPAP.  Sleeping, maintaining oxygen saturation in the high 90s, respiratory rate of 18, no accessory muscle use. [PS]  5364 Chemistry unremarkable, troponin negative, COVID-negative.  VBG consistent with acute respiratory acidosis.  Will admit to hospitalist. [PS]  1230 Repeat VBG improved, ph 7.35, PCO2 decreased to 70s, likely chronic baseline.  [PS]    Clinical Course User Index [PS] Carrie Mew, MD     ____________________________________________   FINAL CLINICAL IMPRESSION(S) / ED DIAGNOSES    Final diagnoses:  COPD exacerbation ([place])  Acute respiratory failure with hypoxia and hypercapnia ([place])  Atrial fibrillation, unspecified type ([place])  Chronic congestive heart failure, unspecified heart failure type ([place])  Type 2 diabetes mellitus without complication, without long-term current use of insulin [facility])     ED Discharge Orders     None       Portions of this note were generated with dragon dictation software. Dictation errors may occur despite best attempts at proofreading.     Carrie Mew, MD 12/22/20 [phone]

## 2020-12-22 NOTE — ED Notes (Signed)
CXR at bedside

## 2020-12-22 NOTE — ED Notes (Signed)
Notified admit MD of patient's HR. Patient's HR went from 70 to 145. EKG taken.

## 2020-12-22 NOTE — Telephone Encounter (Signed)
CALLED PATIENT TO ASK ABOUT RESCHEDULING MISSED SCAN, LVM FOR A RETURN CALL

## 2020-12-22 NOTE — ED Notes (Signed)
COVID swab and bloodwork sent to lab for analysis

## 2020-12-22 NOTE — H&P (Addendum)
History and Physical  Kristin [name] LFY:[id number] DOB: May 17, 1951 DOA: 12/22/2020  Referring physician: Dr. Joni Fears, [place]. PCP: [place]  Outpatient Specialists: Cardiology, pulmonary, hematology oncology. Patient coming from: Home.  Chief Complaint: Shortness of breath.  HPI: Kristin [name] is a 70 y.o. 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 fib on Eliquis, who presented to [facility] ED from home due to acute respiratory distress.  History is mainly obtained from the patient who is on BiPAP at the time of this visit.  Endorses that last night she had sudden onset shortness of breath while she was sitting down in her lounge.  She denies any chest pain.  She denies any change in her diet.  Reports compliance to her home medications including her diuretics and her CPAP at night.  She noted that she had gained 2 pounds yesterday (weights daily), took an extra dose of lasix.  Took her breathing treatment.  She had difficulty going to sleep last night.  This morning she woke up with worsening dyspnea.  She did her breathing treatment without any improvement.  No recent lengthy trips.  No lower extremity tenderness.  No constitutional symptoms.  EMS was activated.  She lives with her daughter.  On EMS assessment, her oxygen saturation was in the 60s on arrival.  She was placed on nonrebreather and brought to the ED for further evaluation and management.  In the ED she was placed on BiPAP.  Venous blood gas revealed respiratory acidosis with pH of 7.23 and PCO2 of 84.  Repeat VBG after being on BiPAP for 2 hours revealed pH 7.35 and  PCO2 of 72.  Additionally her chest x-ray showed pulmonary edema.  BNP greater than 600.  1+ pitting edema in lower extremities bilaterally.  Covid-19 screening test negative.  EDP requested admission for acute COPD exacerbation as well as acute on chronic diastolic CHF.  Patient was admitted under hospitalist service.  ED Course:  Temperature 98.4.  BP 115/50, pulse 66, respiratory 15, O2 saturation 98% on BiPAP.  Lab studies remarkable for serum glucose 181, BUN 32, creatinine 1.78 from baseline of 0.97 on 10/01/2020, GFR 31.  BNP 647.  Troponin -10.  WBC 14.0, hemoglobin 10.6, platelet count 277.  Review of Systems: Review of systems as noted in the HPI. All other systems reviewed and are negative.   Past Medical History:  Diagnosis Date   Anxiety    Asthma    CHF (congestive heart failure) ([place])    2018   COPD (chronic obstructive pulmonary disease) (HCC)    Diabetes mellitus without complication (HCC)    Dyspnea    GERD (gastroesophageal reflux disease)    History of hiatal hernia    History of orthopnea    History of radiation therapy 07/29/20-08/08/20   Right Lung, SBRT Dr. Gery Pray   Hypothyroidism    Neuropathy    Oxygen deficiency    3L/HS   Pain    BACK/DDD   Peripheral vascular disease (HCC)    RLS (restless legs syndrome)    Sleep apnea    Wheezing    Past Surgical History:  Procedure Laterality Date   BREAST SURGERY     CATARACT EXTRACTION W/PHACO Right 03/02/2018  Procedure: CATARACT EXTRACTION PHACO AND INTRAOCULAR LENS PLACEMENT ([facility]);  Surgeon: Marchia Meiers, MD;  Location: [facility] ORS;  Service: Ophthalmology;  Laterality: Right;  [place] 01:15 CDE 16.46 Fluid pack lot # [id number]   CATARACT EXTRACTION W/PHACO Left 04/27/2018   Procedure: CATARACT EXTRACTION PHACO AND INTRAOCULAR LENS PLACEMENT ([place])- LEFT DIABETIC;  Surgeon: Marchia Meiers, MD;  Location: [facility] ORS;  Service: Ophthalmology;  Laterality: Left;  Lot # [id number] H [place]: 00:46.3 CDE: 8.07    CYST  EXCISION     FOREHEAD   FOOT SURGERY     CYST   RIGHT/LEFT HEART CATH AND CORONARY ANGIOGRAPHY N/A 03/31/2020   Procedure: RIGHT/LEFT HEART CATH AND CORONARY ANGIOGRAPHY;  Surgeon: Wellington Hampshire, MD;  Location: [place] [facility];  Service: Cardiovascular;  Laterality: N/A;   TUBAL LIGATION      Social History:  reports that she has quit smoking. Her smoking use included cigarettes. She has a 50.00 pack-year smoking history. She has never used smokeless tobacco. She reports previous alcohol use. No history on file for drug use.   Allergies  Allergen Reactions   Altace [Ramipril] Swelling    Family History  Problem Relation Age of Onset   Heart disease Mother    Cancer Father       Prior to Admission medications   Medication Sig Start Date End Date Taking? Authorizing Provider  albuterol (VENTOLIN HFA) 108 (90 Base) MCG/ACT inhaler Inhale 2 puffs into the lungs every 4 (four) hours as needed for wheezing or shortness of breath.     [provider]  amiodarone (PACERONE) 200 MG tablet Take 1 tablet (200 mg total) by mouth daily. 11/03/20   Minna Merritts, MD  apixaban (ELIQUIS) 5 MG TABS tablet Take 1 tablet (5 mg total) by mouth 2 (two) times daily. 11/03/20   Minna Merritts, MD  atorvastatin (LIPITOR) 80 MG tablet Take 1 tablet (80 mg total) by mouth at bedtime. 11/03/20   Minna Merritts, MD  carvedilol (COREG) 3.125 MG tablet Take 1 tablet (3.125 mg total) by mouth 2 (two) times daily. 11/03/20   Minna Merritts, MD  citalopram (CELEXA) 40 MG tablet Take 40 mg by mouth daily. 06/14/20   [provider]  diclofenac Sodium (VOLTAREN) 1 % GEL Apply 2 g topically 4 (four) times daily as needed.    [provider]  docusate sodium (COLACE) 100 MG capsule Take 100 mg by mouth at bedtime as needed. 03/19/20   [provider]  ezetimibe (ZETIA) 10 MG tablet Take 1 tablet (10 mg total) by mouth daily. 11/03/20 10/29/21  Minna Merritts, MD   famotidine (PEPCID) 40 MG tablet Take 40 mg by mouth daily.    [provider]  fenofibrate 160 MG tablet Take 160 mg by mouth daily.    [provider]  ferrous sulfate 325 (65 FE) MG tablet Take 325 mg by mouth daily. 06/14/20   [provider]  furosemide (LASIX) 20 MG tablet Take 20 mg by mouth every other day. Alternate with 40 mg    [provider]  furosemide (LASIX) 40 MG tablet Take 40 mg by mouth every other day. Alternating with 20 mg    [provider]  gabapentin (NEURONTIN) 600 MG tablet Take 600 mg by mouth 2 (two) times daily.  11/20/19   [provider]  hydrOXYzine (VISTARIL) 50 MG capsule Take 50 mg by mouth every 6 (six) hours as needed for anxiety. 03/14/20   [provider]  ipratropium-albuterol (DUONEB) 0.5-2.5 (3) MG/3ML SOLN Take 3 mLs by nebulization 4 (four) times daily. 08/07/20   [provider]  irbesartan (AVAPRO) 300 MG tablet Take 0.5 tablets (150 mg total) by mouth daily. 06/30/20 06/25/21  Loel Dubonnet, NP  levothyroxine (SYNTHROID) 100 MCG tablet Take by mouth.    [provider]  lidocaine (LIDODERM) 5 % 3 patches daily. 05/28/20   [provider]  lidocaine (LIDODERM) 5 % Apply topically. 05/28/20   [provider]  losartan (COZAAR) 25 MG tablet Take 25 mg by mouth daily. 04/02/20   [provider]  Melatonin 10 MG TABS Take 10 mg by mouth at bedtime as needed (sleep).    [provider]  metFORMIN (GLUCOPHAGE) 500 MG tablet Take 500 mg by mouth 2 (two) times daily. 09/19/19   [provider]  MOVANTIK 25 MG TABS tablet Take 25 mg by mouth daily. 07/21/20   [provider]  nicotine (NICODERM CQ - DOSED IN MG/24 HOURS) 21 mg/24hr patch Place 1 patch (21 mg total) onto the skin daily. 08/15/20   Marrianne Mood D, PA-C  oxyCODONE-acetaminophen (PERCOCET) 10-325 MG tablet Take 1 tablet by mouth 4 (four) times daily as needed. 10/16/20    [provider]  pantoprazole (PROTONIX) 40 MG tablet Take 40 mg by mouth 2 (two) times daily. 11/11/20   [provider]  sitaGLIPtin (JANUVIA) 100 MG tablet Take 100 mg by mouth daily.    [provider]  Tiotropium Bromide Monohydrate 2.5 MCG/ACT AERS Inhale 2 puffs into the lungs daily.     [provider]  Torsemide 40 MG TABS Take 40 mg by mouth every other day. Alternating with 20 mg by mouth every other day 11/03/20   Minna Merritts, MD  Vitamin D, Ergocalciferol, (DRISDOL) 1.25 MG (50000 UNIT) CAPS capsule Take 1 capsule by mouth once a week. 08/14/20   [provider]    Physical Exam: BP 116/85 (BP Location: Right Arm)   Pulse 70   Temp 98.4 F (36.9 C) (Axillary)   Resp 18   Ht 5\' 2"  (1.575 m)   Wt 111.4 kg   LMP  (LMP Unknown)   SpO2 98%   BMI 44.92 kg/m   General: 70 y.o. year-old female well developed well nourished in no acute distress.  Alert and oriented x3.  On BIPAP. Cardiovascular: Bradycardic with no rubs or gallops.  No thyromegaly or JVD noted.  1+ pitting edema in lower extremities bilaterally.   Respiratory: Diffuse Rales and wheezes bilaterally.  On BiPAP.  Poor inspiratory effort.   Abdomen: Severely morbidly obese.  Bowel sounds present.  Nontender with palpation.  bowel sounds x4 quadrants. Muskuloskeletal: No cyanosis or clubbing.  1+ pitting edema in lower extremities bilaterally. Neuro: CN II-XII intact, strength, sensation, reflexes Skin: No ulcerative lesions noted or rashes Psychiatry: Judgement and insight appear normal. Mood is appropriate for condition and setting          Labs on Admission:  Basic Metabolic Panel: Recent Labs  Lab 12/22/20 1016  NA 141  K 4.4  CL 100  CO2 33*  GLUCOSE 181*  BUN 32*  CREATININE 1.78*  CALCIUM 8.7*   Liver Function Tests: Recent Labs  Lab 12/22/20 1016  AST 24  ALT 17  ALKPHOS 36*  BILITOT 0.6  PROT 7.3  ALBUMIN 3.6   No results for input(s):  LIPASE, AMYLASE in the last 168 hours. No results for input(s): AMMONIA in the  last 168 hours. CBC: Recent Labs  Lab 12/22/20 1016  WBC 14.0*  NEUTROABS 11.1*  HGB 10.6*  HCT 34.7*  MCV 97.2  PLT 277   Cardiac Enzymes: No results for input(s): CKTOTAL, CKMB, CKMBINDEX, TROPONINI in the last 168 hours.  BNP (last 3 results) Recent Labs    09/27/20 0313 10/29/20 1334 12/22/20 1016  BNP 1,737.3* 326.3* 647.6*    ProBNP (last 3 results) No results for input(s): PROBNP in the last 8760 hours.  CBG: No results for input(s): GLUCAP in the last 168 hours.  Radiological Exams on Admission: DG Chest Portable 1 View  Result Date: 12/22/2020 CLINICAL DATA:  Respiratory distress, COPD EXAM: PORTABLE CHEST 1 VIEW COMPARISON:  10/29/2020 FINDINGS: Chronic interstitial changes with suspected superimposed edema. No significant pleural effusion. Stable cardiomediastinal contours. No pneumothorax. IMPRESSION: Chronic interstitial changes with suspected superimposed edema. Electronically Signed   By: Macy Mis M.D.   On: 12/22/2020 10:43    EKG: I independently viewed the EKG done and my findings are as followed: Sinus bradycardia rate of 55.  Nonspecific ST-T changes.  QTc 459.  Assessment/Plan Present on Admission:  Acute exacerbation of chronic obstructive pulmonary disease (COPD) ([place])  Active Problems:   Acute exacerbation of chronic obstructive pulmonary disease (COPD) (HCC)  Acute exacerbation of COPD/asthma in the setting of ongoing tobacco use (1 pack/day), severe aortic stenosis, acute HFpEF 55 to 60% Admits to ongoing tobacco use 1 pack/day Treat underlying conditions Tobacco cessation counseling done at bedside Started on IV diuretics, bronchodilators, BiPAP therapy IV Rocephin 2 g x 5 days for acute COPD exacerbation IV Solumedrol 40 mg BID for asthma exacerbation Wean off BiPAP as tolerated She has never been intubated.  She is a full code. Maintain O2 saturation  greater than 90%.  Acute on chronic hypoxic hypercarbic respiratory failure multifactorial secondary to acute COPD exacerbation and acute on chronic diastolic CHF Presented with acute respiratory distress with onset of symptoms last night. Requiring BiPAP in the ED, wean off as tolerated At baseline she is on 3 L nasal cannula and on CPAP at night Continue management as stated above  Acute on chronic diastolic CHF Reports 2 pounds weight gain yesterday On presentation BNP>600, pulmonary edema on CXR, 1+ pitting edema in lower extremities. IV diuretics, continue Replete electrolytes as indicated Hold off home cardiac medications for now due to soft blood pressures. Closely monitor vital signs, maintain MAP greater than 65 while on IV diuretic Start strict I's and O's and daily weight  Elevated troponin suspect demand ischemia in the setting of severe hypoxia Troponin 26 Admission 12 lead EKG reviewed no evidence of acute ischemia She denies any anginal symptoms Recent 2 D echo done on 10/24/20, follows with cardiology outpatient Consult cardiology in the AM  Pulmonary edema, cardiogenic Personally reviewed CXR done on admission which showed increase in pulmonary vascularity suggestive of pulmonary edema. Covid-19 test negative Ongoing diuresing, continue Incentive spirometer Maintain O2 saturation greater than 90%  AKI, suspect intrinsic Hold off home ARB and NSAIDs Avoid nephrotoxic agents and hypotension Monitor urine output Closely monitor renal function while on IV diuretics. Repeat BMP in the morning.  Leukocytosis, suspect reactive in the setting of COPD exacerbation She presented with WBC of 14.0, afebrile Started IV Solu-Medrol for asthma exacerbation 40 mg twice daily x3 days She is on IV Rocephin 2 g x 5 days for asthma/COPD exacerbation Obtain procalcitonin level in the morning Monitor fever curve and WBC  Prediabetes with hyperglycemia Last hemoglobin A1c  6.2  on 09/26/2020. Obtain hemoglobin A1c Hold off home hypoglycemics Start insulin sliding scale.  Paroxysmal A. fib, on Eliquis She is currently in sinus rhythm and rate is controlled Monitor on telemetry Hold off home rate control agents due to soft blood pressures. Continue home Eliquis for CVA prevention She follows with cardiology outpatient  Severe aortic stenosis with plan for possible TAVR Per the patient, plan for TAVR has been delayed due to her respiratory status. Consult cardiology in the morning to assist with the management of her extensive underlying cardiac history.  Severe morbid obesity BMI 44 Recommend weight loss outpatient with regular physical activity and healthy dieting.  Anemia of chronic disease Hemoglobin 10.6 No overt bleeding Monitor for now  Hypothyroidism/hyperlipidemia/chronic anxiety/depression/diabetic polyneuropathy. Resume home regimen. Decrease home gabapentin dose down to 300 mg BID from 600 mg BID.  OSA on CPAP at night prior to admission Will have on BiPAP nightly for now due to pulmonary edema, hypercarbia and hypoxemia.  Ambulatory dysfunction Uses a walker at baseline PT OT assessment Fall precautions TOC to assist with DC planning   DVT prophylaxis: Eliquis  Code Status: Full code  Family Communication: None at bedside  Disposition Plan: Admit to stepdown unit  Consults called: Consult cardiology in the morning.  Admission status: Inpatient status.  Patient will require least 2 midnights for further evaluation and treatment of present condition.   Status is: Inpatient   Dispo: The patient is from: Home.                Anticipated d/c is to: Home when cardiology signs off.                Patient currently not stable for discharge.     Difficult to place patient, not applicable.         Kayleen Memos MD [facility] Pager [phone]  If 7PM-7AM, please contact night-coverage [email or web address] Password  [facility]  12/22/2020, 12:34 PM

## 2020-12-22 NOTE — ED Notes (Signed)
Purewick applied to patient. 

## 2020-12-22 NOTE — ED Notes (Signed)
[name] (daughter) updated on patient's condition at this time.

## 2020-12-22 NOTE — ED Notes (Signed)
Food and fluids provided for patient.

## 2020-12-22 NOTE — ED Notes (Signed)
Bipap mask disconnected per MD's request. Placed patient on 2.4L via [place].

## 2020-12-22 NOTE — ED Notes (Signed)
Blanket provided for patient.

## 2020-12-22 NOTE — ED Notes (Signed)
MD in room

## 2020-12-22 NOTE — ED Notes (Signed)
MD aware of patient's vital signs (HR in 40's-50's) No action taken at this time.

## 2020-12-23 ENCOUNTER — Other Ambulatory Visit: Payer: Self-pay

## 2020-12-23 DIAGNOSIS — J441 Chronic obstructive pulmonary disease with (acute) exacerbation: Principal | ICD-10-CM

## 2020-12-23 DIAGNOSIS — I5033 Acute on chronic diastolic (congestive) heart failure: Secondary | ICD-10-CM

## 2020-12-23 DIAGNOSIS — J9621 Acute and chronic respiratory failure with hypoxia: Secondary | ICD-10-CM

## 2020-12-23 DIAGNOSIS — J9601 Acute respiratory failure with hypoxia: Secondary | ICD-10-CM | POA: Diagnosis present

## 2020-12-23 DIAGNOSIS — J9622 Acute and chronic respiratory failure with hypercapnia: Secondary | ICD-10-CM

## 2020-12-23 LAB — CBC
HCT: 29.3 % — ABNORMAL LOW (ref 36.0–46.0)
Hemoglobin: 9.1 g/dL — ABNORMAL LOW (ref 12.0–15.0)
MCH: 29.6 pg (ref 26.0–34.0)
MCHC: 31.1 g/dL (ref 30.0–36.0)
MCV: 95.4 fL (ref 80.0–100.0)
Platelets: 227 10*3/uL (ref 150–400)
RBC: 3.07 MIL/uL — ABNORMAL LOW (ref 3.87–5.11)
RDW: 15.3 % (ref 11.5–15.5)
WBC: 7.2 10*3/uL (ref 4.0–10.5)
nRBC: 0 % (ref 0.0–0.2)

## 2020-12-23 LAB — HEMOGLOBIN A1C
Hgb A1c MFr Bld: 5.4 % (ref 4.8–5.6)
Mean Plasma Glucose: 108.28 mg/dL

## 2020-12-23 LAB — MAGNESIUM: Magnesium: 2.4 mg/dL (ref 1.7–2.4)

## 2020-12-23 LAB — IRON AND TIBC
Iron: 29 ug/dL (ref 28–170)
Saturation Ratios: 6 % — ABNORMAL LOW (ref 10.4–31.8)
TIBC: 465 ug/dL — ABNORMAL HIGH (ref 250–450)
UIBC: 436 ug/dL

## 2020-12-23 LAB — BASIC METABOLIC PANEL WITH GFR
Anion gap: 10 (ref 5–15)
BUN: 37 mg/dL — ABNORMAL HIGH (ref 8–23)
CO2: 35 mmol/L — ABNORMAL HIGH (ref 22–32)
Calcium: 8.8 mg/dL — ABNORMAL LOW (ref 8.9–10.3)
Chloride: 97 mmol/L — ABNORMAL LOW (ref 98–111)
Creatinine, Ser: 1.81 mg/dL — ABNORMAL HIGH (ref 0.44–1.00)
GFR, Estimated: 30 mL/min — ABNORMAL LOW
Glucose, Bld: 206 mg/dL — ABNORMAL HIGH (ref 70–99)
Potassium: 4.4 mmol/L (ref 3.5–5.1)
Sodium: 142 mmol/L (ref 135–145)

## 2020-12-23 LAB — PHOSPHORUS: Phosphorus: 4 mg/dL (ref 2.5–4.6)

## 2020-12-23 LAB — CBG MONITORING, ED
Glucose-Capillary: 154 mg/dL — ABNORMAL HIGH (ref 70–99)
Glucose-Capillary: 170 mg/dL — ABNORMAL HIGH (ref 70–99)

## 2020-12-23 LAB — PROCALCITONIN: Procalcitonin: <0.1 ng/mL

## 2020-12-23 LAB — GLUCOSE, CAPILLARY
Glucose-Capillary: 124 mg/dL — ABNORMAL HIGH (ref 70–99)
Glucose-Capillary: 136 mg/dL — ABNORMAL HIGH (ref 70–99)

## 2020-12-23 LAB — VITAMIN B12: Vitamin B-12: 208 pg/mL (ref 180–914)

## 2020-12-23 MED ORDER — NYSTATIN 100000 UNIT/GM EX POWD
Freq: Two times a day (BID) | CUTANEOUS | Status: DC
  Filled 2020-12-23: qty 15

## 2020-12-23 MED ORDER — TORSEMIDE 20 MG PO TABS
40.0000 mg | ORAL_TABLET | Freq: Two times a day (BID) | ORAL | Status: DC
  Administered 2020-12-23 – 2020-12-25 (×4): 40 mg via ORAL
  Filled 2020-12-23 (×3): qty 2

## 2020-12-23 NOTE — Progress Notes (Signed)
PROGRESS NOTE    Kristin [name]  XFG:[id number] DOB: 1950-10-31 DOA: 12/22/2020 PCP: [place]   Chief complaint for shortness of breath. Brief Narrative:  Kristin [name] is a 70 y.o. 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 fib on Eliquis, who presented to [facility] ED from home due to acute respiratory distress. She was placed on BiPAP after arriving the hospital, she was also giving bronchodilator and IV steroids, she also receiving IV Lasix.  Assessment & Plan:   Active Problems:   Acute exacerbation of chronic obstructive pulmonary disease (COPD) ([place])  #1.  Acute on chronic respiratory failure with hypoxemia and hypercapnia. Acute on chronic diastolic congestive heart failure. COPD exacerbation. Elevated troponin secondary to exacerbation congestive heart failure. Patient was giving IV steroids, IV Lasix.  She was also placed on BiPAP last night.  Her condition much improved this morning, her volume status is better.  I will change Lasix to oral torsemide. Continue steroids. Patient probably can go home tomorrow.  2.  Acute kidney injury. We will check a BMP tomorrow.  3.  Paroxysmal atrial fibrillation. Severe aortic stenosis. Continue to follow.  #4.  Uncontrolled type 2 diabetes with hyperglycemia. Continue sliding scale insulin.  Will wean down steroids as soon as condition improved.  #5.  Anemia.  Check iron B12 level   DVT prophylaxis: Eliquis Code Status: full Family Communication: APS in room Disposition Plan:    Status is: Inpatient  Remains inpatient appropriate because:IV  treatments appropriate due to intensity of illness or inability to take PO and Inpatient level of care appropriate due to severity of illness  Dispo: The patient is from: Home              Anticipated d/c is to: Home              Patient currently is not medically stable to d/c.   Difficult to place patient No        I/O last 3 completed shifts: In: 700 [P.O.:700] Out: 2350 [Urine:2350] No intake/output data recorded.     Consultants:  None  Procedures: None  Antimicrobials: None   Subjective: Patient is off of BiPAP this morning, currently on 2 L oxygen.  She feels much improved.  She has a cough, nonproductive. She denied any fever or chills. She does not have any chest pain palpitation No dysuria hematuria   Objective: Vitals:   12/23/20 0530 12/23/20 0600 12/23/20 0630 12/23/20 0700  BP: 130/72 126/61 (!) 132/52 (!) 138/48  Pulse: 65 65 60 86  Resp: 15 16 15 17   Temp:      TempSrc:      SpO2: 99% 99% 98% 98%  Weight:      Height:        Intake/Output Summary (Last 24 hours) at 12/23/2020 1216 Last data filed at 12/22/2020 2359 Gross per 24 hour  Intake 700 ml  Output 2350 ml  Net -1650 ml   Filed Weights   12/22/20 1014  Weight: 111.4 kg    Examination:  General exam: Appears calm and comfortable  Respiratory system: Decreased breathing sounds without crackles.Marland Kitchen Respiratory effort normal. Cardiovascular system: S1 & S2 heard, RRR. No JVD, murmurs, rubs, gallops or clicks.  No pedal edema. Gastrointestinal system: Abdomen is nondistended, soft and nontender. No organomegaly or masses felt. Normal bowel sounds heard. Central nervous system: Alert and orientedx2. No focal neurological deficits. Extremities: Symmetric 5 x 5 power. Skin: No rashes, lesions or ulcers Psychiatry: Judgement and insight appear normal. Mood & affect appropriate.     Data Reviewed: I have personally reviewed following labs and imaging studies  CBC: Recent Labs  Lab  12/22/20 1016 12/23/20 0509  WBC 14.0* 7.2  NEUTROABS 11.1*  --   HGB 10.6* 9.1*  HCT 34.7* 29.3*  MCV 97.2 95.4  PLT 277 427   Basic Metabolic Panel: Recent Labs  Lab 12/22/20 1016 12/23/20 0509  NA 141 142  K 4.4 4.4  CL 100 97*  CO2 33* 35*  GLUCOSE 181* 206*  BUN 32* 37*  CREATININE 1.78* 1.81*  CALCIUM 8.7* 8.8*  MG  --  2.4  PHOS  --  4.0   GFR: Estimated Creatinine Clearance: 35 mL/min (A) (by C-G formula based on SCr of 1.81 mg/dL (H)). Liver Function Tests: Recent Labs  Lab 12/22/20 1016  AST 24  ALT 17  ALKPHOS 36*  BILITOT 0.6  PROT 7.3  ALBUMIN 3.6   No results for input(s): LIPASE, AMYLASE in the last 168 hours. No results for input(s): AMMONIA in the last 168 hours. Coagulation Profile: No results for input(s): INR, PROTIME in the last 168 hours. Cardiac Enzymes: No results for input(s): CKTOTAL, CKMB, CKMBINDEX, TROPONINI in the last 168 hours. BNP (last 3 results) No results for input(s): PROBNP in the last 8760 hours. HbA1C: Recent Labs    12/23/20 0509  HGBA1C 5.4   CBG: Recent Labs  Lab 12/22/20 2156 12/23/20 0852  GLUCAP 179* 154*   Lipid Profile: No results for input(s): CHOL, HDL, LDLCALC, TRIG, CHOLHDL, LDLDIRECT in the last 72 hours. Thyroid Function Tests: No results for input(s): TSH, T4TOTAL, FREET4, T3FREE, THYROIDAB in the last 72 hours. Anemia Panel: No results for input(s): VITAMINB12, FOLATE, FERRITIN, TIBC, IRON, RETICCTPCT in the last 72 hours. Sepsis Labs: Recent Labs  Lab 12/23/20 0509  PROCALCITON <0.10    Recent Results (from the past 240 hour(s))  Resp Panel by RT-PCR (Flu A&B, Covid) Nasopharyngeal Swab     Status: None   Collection Time: 12/22/20 10:16 AM   Specimen: Nasopharyngeal Swab; Nasopharyngeal(NP) swabs in vial transport medium  Result Value Ref Range Status   SARS Coronavirus 2 by RT PCR NEGATIVE NEGATIVE Final    Comment: (NOTE) SARS-CoV-2 target nucleic acids are NOT DETECTED.  The  SARS-CoV-2 RNA is generally detectable in upper respiratory specimens during the acute phase of infection. The lowest concentration of SARS-CoV-2 viral copies this assay can detect is 138 copies/mL. A negative result does not preclude SARS-Cov-2 infection and should not be used as the sole basis for treatment or other patient management decisions. A negative result may occur with  improper specimen collection/handling, submission of specimen other than nasopharyngeal swab, presence of viral mutation(s) within the areas targeted by this assay, and inadequate number of viral copies(<138 copies/mL). A negative result must be combined with clinical observations, patient history, and epidemiological information. The expected result is Negative.  Fact Sheet for Patients:  EntrepreneurPulse.com.au  Fact Sheet for Healthcare Providers:  IncredibleEmployment.be  This test is no t yet approved or cleared by the [place] FDA and  has been authorized for detection and/or diagnosis of SARS-CoV-2 by FDA under an Emergency Use Authorization (EUA). This EUA will remain  in  effect (meaning this test can be used) for the duration of the COVID-19 declaration under Section 564(b)(1) of the Act, 21 U.S.C.section 360bbb-3(b)(1), unless the authorization is terminated  or revoked sooner.       Influenza A by PCR NEGATIVE NEGATIVE Final   Influenza B by PCR NEGATIVE NEGATIVE Final    Comment: (NOTE) The Xpert Xpress SARS-CoV-2/FLU/RSV plus assay is intended as an aid in the diagnosis of influenza from Nasopharyngeal swab specimens and should not be used as a sole basis for treatment. Nasal washings and aspirates are unacceptable for Xpert Xpress SARS-CoV-2/FLU/RSV testing.  Fact Sheet for Patients: EntrepreneurPulse.com.au  Fact Sheet for Healthcare Providers: IncredibleEmployment.be  This test is not yet approved or  cleared by the [place] FDA and has been authorized for detection and/or diagnosis of SARS-CoV-2 by FDA under an Emergency Use Authorization (EUA). This EUA will remain in effect (meaning this test can be used) for the duration of the COVID-19 declaration under Section 564(b)(1) of the Act, 21 U.S.C. section 360bbb-3(b)(1), unless the authorization is terminated or revoked.  Performed at [facility], [address] [place], [place] [address]   Blood culture (routine x 2)     Status: None (Preliminary result)   Collection Time: 12/22/20 12:23 PM   Specimen: BLOOD  Result Value Ref Range Status   Specimen Description BLOOD BLOOD RIGHT FOREARM  Final   Special Requests   Final    BOTTLES DRAWN AEROBIC AND ANAEROBIC Blood Culture adequate volume   Culture   Final    NO GROWTH < 24 HOURS Performed at [facility], [address], [place], [place] [address]    Report Status PENDING  Incomplete  Blood culture (routine x 2)     Status: None (Preliminary result)   Collection Time: 12/22/20 12:23 PM   Specimen: BLOOD  Result Value Ref Range Status   Specimen Description BLOOD BLOOD LEFT FOREARM  Final   Special Requests   Final    BOTTLES DRAWN AEROBIC AND ANAEROBIC Blood Culture adequate volume   Culture   Final    NO GROWTH < 24 HOURS Performed at [facility], [address], [place], [place] [address]    Report Status PENDING  Incomplete         Radiology Studies: DG Chest Portable 1 View  Result Date: 12/22/2020 CLINICAL DATA:  Respiratory distress, COPD EXAM: PORTABLE CHEST 1 VIEW COMPARISON:  10/29/2020 FINDINGS: Chronic interstitial changes with suspected superimposed edema. No significant pleural effusion. Stable cardiomediastinal contours. No pneumothorax. IMPRESSION: Chronic interstitial changes with suspected superimposed edema. Electronically Signed   By: Macy Mis M.D.   On: 12/22/2020 10:43        Scheduled Meds:   apixaban  5 mg Oral BID   atorvastatin  80 mg Oral QHS   citalopram  40 mg Oral Daily   ezetimibe  10 mg Oral Daily   famotidine  40 mg Oral Daily   fenofibrate  160 mg Oral Daily   ferrous sulfate  325 mg Oral Daily   furosemide  40 mg Intravenous BID   gabapentin  300 mg Oral BID   insulin aspart  0-5 Units Subcutaneous QHS   insulin aspart  0-9 Units Subcutaneous TID WC   ipratropium-albuterol  3 mL Nebulization Q6H   levothyroxine  100 mcg Oral Q0600   methylPREDNISolone (SOLU-MEDROL) injection  40 mg Intravenous BID   mometasone-formoterol  2 puff Inhalation BID   nicotine  21 mg Transdermal Daily  pantoprazole  40 mg Oral BID   potassium chloride  20 mEq Oral Daily   senna-docusate  1 tablet Oral QHS   Continuous Infusions:  cefTRIAXone (ROCEPHIN)  IV Stopped (12/22/20 1327)     LOS: 1 day    Time spent: 32 minutes    Sharen Hones, MD [facility]   To contact the attending provider between 7A-7P or the covering provider during after hours 7P-7A, please log into the web site [email or web address] and access using universal [place] password for that web site. If you do not have the password, please call the hospital operator.  12/23/2020, 12:16 PM

## 2020-12-23 NOTE — Consult Note (Signed)
Heart Failure Nurse Navigator Note  HFpEF 55 to 60%.  Moderate LVH.  Mildly elevated pulmonary artery pressures.  Normal right ventricular systolic function mild biatrial enlargement.  She presented to the emergency room with complaints of worsening shortness of breath and 2 pound weight gain.   Comorbidities:   Asthma Ongoing tobacco abuse COPD with chronic use of O2 at 3 L Obstructive sleep apnea with CPAP Hypertension Hyperlipidemia Type 2 diabetes Iron deficiency anemia Chronic anxiety/depression Paroxysmal atrial fibrillation on anticoagulation  Labs:  Sodium 142, potassium 4.4, chloride 97, CO2 35, BUN 37, creatinine 1.8, BNP 647  Medications:  Eliquis 5 mg twice a day Atorvastatin 80 mg at bedtime Zetia 10 mg daily Fenofibrate 160 mg daily Potassium chloride 20 mEq daily Torsemide 40 mg 2 times a day   Met with patient today, he is currently lying in bed in no acute distress.  She states that she does weigh herself on a daily basis and had noticed a 2 pound weight gain and took an extra water pill.  She states that she tries to maintain a low-sodium diet and does not use salt at the table.  She states she does not even add salt to her tomato sandwiches.  Discussed daily fluid intake and she feels that she takes in more than 64 ounces daily.  She has a large cup that she feels at least 3 times a day and then at bedtime, recommended a measuring cup seeing how much that container holds so that she can maintain the 64 ounces daily.  She voices understanding  She was given the living with heart failure teaching booklet along with low-sodium information.  She had no further questions at this time.  She has an appointment in the outpatient heart failure clinic.  Will continue to follow along.     RN CHFN  

## 2020-12-23 NOTE — ED Notes (Signed)
Pt placed on bipap as requested with use of previous settings.

## 2020-12-23 NOTE — ED Notes (Signed)
Pt transferred into hospital bed and given [place] sandwich and diet cola.

## 2020-12-23 NOTE — Evaluation (Signed)
Physical Therapy Evaluation Patient Details Name: Kristin [name] MRN: [id number] DOB: 11-Dec-1950 Today's Date: 12/23/2020   History of Present Illness  70 y.o. 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 fib on Eliquis, who presented to [facility] ED from home due to acute respiratory distress. Pt's HR increases to 144 and SPO2 drops to 83% with minimal ambulation.  Clinical Impression  Patient is A and O x 4 want to return home. Pt agreed to participate in therapy. Pt HR increases to 144/min and SPO2 83% with minimal exertion with O2 via [place] at 2L. Pt needs Min assist to [place] with all activities at this point. Pt will benefit form endurance training and cardiac rehab to improve QOL and return to PLOF.     Follow Up Recommendations Home health PT (after [place], pt may benefit form Out pt cardiopulmonary rehab)    Equipment Recommendations     Pt has all Equipment  Recommendations for Other Services       Precautions / Restrictions Precautions Precautions: Fall Restrictions Weight Bearing Restrictions: No      Mobility  Bed Mobility Overal bed mobility: Needs Assistance Bed Mobility: Supine to Sit;Sit to Supine     Supine to sit: Min guard Sit to supine: Min guard   General bed mobility comments: min A for trunk support and min cuing for technique    Transfers Overall transfer level: Needs assistance Equipment used: Rolling walker (2 wheeled);1 person hand held assist Transfers: Sit to/from Stand Sit to Stand: Min guard Stand pivot transfers: Min guard          Ambulation/Gait Ambulation/Gait assistance: Min  guard Gait Distance (Feet): 12 Feet Assistive device: Rolling walker (2 wheeled) Gait Pattern/deviations: Decreased stride length Gait velocity: decreased      Stairs            Wheelchair Mobility    Modified Rankin (Stroke Patients Only)       Balance Overall balance assessment: Modified Independent Sitting-balance support: Bilateral upper extremity supported Sitting balance-Leahy Scale: Good     Standing balance support: Bilateral upper extremity supported Standing balance-Leahy Scale: Fair                               Pertinent Vitals/Pain Pain Assessment: No/denies pain    Home Living Family/patient expects to be discharged to:: Private residence Living Arrangements: Children Available Help at Discharge: Family;Available 24 hours/day Type of Home: House Home Access: Ramped entrance     Home Layout: One level Home Equipment: Walker - 4 wheels;Bedside commode;Shower seat Additional Comments: rollator    Prior Function Level of Independence: Independent   Gait / Transfers Assistance Needed: Pt Independent in home with O2 via [place] using FWW. has O2 concentrator and extended O2 tubes.  ADL's / Homemaking Assistance Needed: Pt sink baths or uses shower seat with assistance from family to get in and out of shower. She does endorse assist with LB dressing.  Comments: 3L O2 via [place] at baseline. Lives with daughter and 59 y/o granddaughter. daughter drives pt to the appointments.     Hand Dominance   Dominant Hand: Right    Extremity/Trunk Assessment   Upper Extremity  Assessment Upper Extremity Assessment: Overall WFL for tasks assessed    Lower Extremity Assessment Lower Extremity Assessment: Generalized weakness    Cervical / Trunk Assessment Cervical / Trunk Assessment: Kyphotic  Communication   Communication: No difficulties  Cognition Arousal/Alertness: Awake/alert Behavior During Therapy: WFL for tasks assessed/performed Overall  Cognitive Status: Within Functional Limits for tasks assessed                                 General Comments: Pleasant and cooperative      General Comments      Exercises     Assessment/Plan    PT Assessment Patient needs continued PT services  PT Problem List Decreased strength;Cardiopulmonary status limiting activity;Decreased activity tolerance;Decreased balance;Decreased mobility;Decreased knowledge of precautions;Decreased skin integrity;Obesity       PT Treatment Interventions Gait training;Functional mobility training;Therapeutic activities;Patient/family education;Neuromuscular re-education;Balance training;Therapeutic exercise    PT Goals (Current goals can be found in the Care Plan section)  Acute Rehab PT Goals Patient Stated Goal: " Return  home." PT Goal Formulation: With patient Time For Goal Achievement: 01/06/21 Potential to Achieve Goals: Good    Frequency Min 2X/week   Barriers to discharge        Co-evaluation               AM-PAC PT "6 Clicks" Mobility  Outcome Measure Help needed turning from your back to your side while in a flat bed without using bedrails?: None Help needed moving from lying on your back to sitting on the side of a flat bed without using bedrails?: A Little Help needed moving to and from a bed to a chair (including a wheelchair)?: A Little Help needed standing up from a chair using your arms (e.g., wheelchair or bedside chair)?: A Little Help needed to walk in hospital room?: A Little Help needed climbing 3-5 steps with a railing? : A Lot 6 Click Score: 18    End of Session     Patient left: in bed;with call bell/phone within reach;with family/visitor present Nurse Communication: Mobility status;Other (comment) (Communnicatedwith Nurse about SPO2, HR and can use bathroom as needed with one person assist. Pt also needs meds for possible yeast infection in suprapubic and under the breast regions.) PT Visit  Diagnosis: Unsteadiness on feet (R26.81);Muscle weakness (generalized) (M62.81);Difficulty in walking, not elsewhere classified (R26.2)    Time: 4496-7591 PT Time Calculation (min) (ACUTE ONLY): 23 min   Charges:   PT Evaluation $PT Eval Moderate Complexity: 1 Mod PT Treatments $Therapeutic Activity: 8-22 mins        Rhea Kaelin PT DPT 1:24 PM,12/23/20

## 2020-12-23 NOTE — Evaluation (Signed)
Occupational Therapy Evaluation Patient Details Name: Kristin [name] MRN: [id number] DOB: 03-21-1951 Today's Date: 12/23/2020    History of Present Illness 70 y.o. 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 fib on Eliquis, who presented to [facility] ED from home due to acute respiratory distress.   Clinical Impression   Patient presenting with decreased I in self care, balance, functional mobility/transfers, endurance, and safety awareness. Patient reports living at home with family and they are available 24/7 at discharge. Pt endorses being on 3 L O2 via [place] at baseline. Pt ambulates short distances within the home ( to bathroom or kitchen) PTA. Her family assists her with ADLs ( LB self care) and IADLs. Patient currently functioning at min A overall but fatigues quickly. Pt's O2 remained at or above 88% and HR 80-110 bpm. Patient will benefit from acute OT to increase overall independence in the areas of ADLs, functional mobility, and safety awareness in order to safely discharge home with family.    Follow Up Recommendations  Home health OT;Supervision - Intermittent    Equipment Recommendations  Other (comment);None recommended by OT (has all needed equipment)       Precautions / Restrictions Precautions Precautions: Fall      Mobility Bed Mobility Overal bed mobility: Needs Assistance Bed Mobility: Supine to Sit;Sit to Supine     Supine to sit: Min assist Sit to supine: Min guard   General bed mobility comments: min A for trunk support and min cuing for technique    Transfers Overall transfer level: Needs  assistance Equipment used: 1 person hand held assist Transfers: Stand Pivot Transfers;Sit to/from Stand Sit to Stand: Min assist Stand pivot transfers: Min assist            Balance Overall balance assessment: Needs assistance Sitting-balance support: Feet supported Sitting balance-Leahy Scale: Good     Standing balance support: During functional activity Standing balance-Leahy Scale: Fair                             ADL either performed or assessed with clinical judgement   ADL Overall ADL's : Needs assistance/impaired Eating/Feeding: Independent;Bed level   Grooming: Wash/dry hands;Wash/dry face;Set up               Lower Body Dressing: Minimal assistance;Moderate assistance Lower Body Dressing Details (indicate cue type and reason): don B socks Toilet Transfer: Minimal assistance Toilet Transfer Details (indicate cue type and reason): simulated transfer                 Vision Patient Visual Report: No change from baseline              Pertinent Vitals/Pain Pain Assessment: No/denies pain     Hand Dominance Right   Extremity/Trunk Assessment Upper Extremity Assessment Upper Extremity Assessment: Generalized weakness   Lower Extremity Assessment Lower Extremity Assessment: Generalized weakness   Cervical / Trunk Assessment Cervical / Trunk Assessment: Kyphotic   Communication Communication Communication: No difficulties   Cognition Arousal/Alertness: Awake/alert Behavior During Therapy: WFL for tasks assessed/performed Overall Cognitive Status: Within Functional Limits for tasks assessed  General Comments: Pleasant and cooperative              Home Living Family/patient expects to be discharged to:: Private residence Living Arrangements: Children;Other (Comment) Available Help at Discharge: Family;Available 24 hours/day Type of Home: House Home Access: Ramped entrance      Home Layout: One level     Bathroom Shower/Tub: Tub/shower unit         Home Equipment: Environmental consultant - 4 wheels;Bedside commode;Shower seat          Prior Functioning/Environment Level of Independence: Needs assistance  Gait / Transfers Assistance Needed: Pt reports use of RW or SPC for functional mobility for short distances at home ADL's / Homemaking Assistance Needed: Pt sink baths or uses shower seat with assistance from family to get in and out of shower. She does endorse assist with LB dressing.   Comments: 3L O2 via [place] at baseline. Lives with daughter and 70 y/o granddaughter. She sleeps in regular bed and reports she doesn't do much other than watch TV and go to bathroom and kitchen.        OT Problem List: Decreased strength;Impaired balance (sitting and/or standing);Decreased activity tolerance;Decreased knowledge of precautions;Cardiopulmonary status limiting activity;Decreased safety awareness;Decreased knowledge of use of DME or AE      OT Treatment/Interventions: Self-care/ADL training;Energy conservation;Balance training;Modalities;Therapeutic exercise;DME and/or AE instruction;Therapeutic activities;Patient/family education;Manual therapy    OT Goals(Current goals can be found in the care plan section) Acute Rehab OT Goals Patient Stated Goal: to go home OT Goal Formulation: With patient/family Time For Goal Achievement: 01/06/21 Potential to Achieve Goals: Good  OT Frequency: Min 2X/week   Barriers to D/C:    none known at this time          AM-PAC OT "6 Clicks" Daily Activity     Outcome Measure Help from another person eating meals?: None Help from another person taking care of personal grooming?: A Little Help from another person toileting, which includes using toliet, bedpan, or urinal?: A Lot Help from another person bathing (including washing, rinsing, drying)?: A Lot Help from another person to put on and taking off regular upper body clothing?: A  Little Help from another person to put on and taking off regular lower body clothing?: A Lot 6 Click Score: 16   End of Session Equipment Utilized During Treatment: Oxygen Nurse Communication: Mobility status  Activity Tolerance: Patient tolerated treatment well Patient left: in bed;with call bell/phone within reach;with family/visitor present  OT Visit Diagnosis: Unsteadiness on feet (R26.81);Repeated falls (R29.6);Muscle weakness (generalized) (M62.81)                Time: 1610-9604 OT Time Calculation (min): 31 min Charges:  OT General Charges $OT Visit: 1 Visit OT Evaluation $OT Eval Moderate Complexity: 1 Mod OT Treatments $Self Care/Home Management : 8-22 mins $Therapeutic Activity: 8-22 mins  Darleen Crocker, MS, OTR/L , CBIS ascom [phone]  12/23/20, 11:32 AM

## 2020-12-24 DIAGNOSIS — J9601 Acute respiratory failure with hypoxia: Secondary | ICD-10-CM

## 2020-12-24 LAB — TROPONIN I (HIGH SENSITIVITY)
Troponin I (High Sensitivity): 22 ng/L — ABNORMAL HIGH (ref <18)
Troponin I (High Sensitivity): 24 ng/L — ABNORMAL HIGH (ref <18)

## 2020-12-24 LAB — CBC WITH DIFFERENTIAL/PLATELET
Abs Immature Granulocytes: 0.06 10*3/uL (ref 0.00–0.07)
Basophils Absolute: 0 10*3/uL (ref 0.0–0.1)
Basophils Relative: 0 %
Eosinophils Absolute: 0 10*3/uL (ref 0.0–0.5)
Eosinophils Relative: 0 %
HCT: 30.9 % — ABNORMAL LOW (ref 36.0–46.0)
Hemoglobin: 9.6 g/dL — ABNORMAL LOW (ref 12.0–15.0)
Immature Granulocytes: 1 %
Lymphocytes Relative: 3 %
Lymphs Abs: 0.3 10*3/uL — ABNORMAL LOW (ref 0.7–4.0)
MCH: 29.3 pg (ref 26.0–34.0)
MCHC: 31.1 g/dL (ref 30.0–36.0)
MCV: 94.2 fL (ref 80.0–100.0)
Monocytes Absolute: 0.2 10*3/uL (ref 0.1–1.0)
Monocytes Relative: 2 %
Neutro Abs: 10.5 10*3/uL — ABNORMAL HIGH (ref 1.7–7.7)
Neutrophils Relative %: 94 %
Platelets: 227 10*3/uL (ref 150–400)
RBC: 3.28 MIL/uL — ABNORMAL LOW (ref 3.87–5.11)
RDW: 15.2 % (ref 11.5–15.5)
WBC: 11.1 10*3/uL — ABNORMAL HIGH (ref 4.0–10.5)
nRBC: 0 % (ref 0.0–0.2)

## 2020-12-24 LAB — GLUCOSE, CAPILLARY
Glucose-Capillary: 235 mg/dL — ABNORMAL HIGH (ref 70–99)
Glucose-Capillary: 108 mg/dL — ABNORMAL HIGH (ref 70–99)
Glucose-Capillary: 125 mg/dL — ABNORMAL HIGH (ref 70–99)
Glucose-Capillary: 103 mg/dL — ABNORMAL HIGH (ref 70–99)

## 2020-12-24 LAB — BASIC METABOLIC PANEL
Anion gap: 13 (ref 5–15)
BUN: 41 mg/dL — ABNORMAL HIGH (ref 8–23)
CO2: 35 mmol/L — ABNORMAL HIGH (ref 22–32)
Calcium: 8.9 mg/dL (ref 8.9–10.3)
Chloride: 91 mmol/L — ABNORMAL LOW (ref 98–111)
Creatinine, Ser: 1.89 mg/dL — ABNORMAL HIGH (ref 0.44–1.00)
GFR, Estimated: 29 mL/min — ABNORMAL LOW (ref >60)
Glucose, Bld: 243 mg/dL — ABNORMAL HIGH (ref 70–99)
Potassium: 4.8 mmol/L (ref 3.5–5.1)
Sodium: 139 mmol/L (ref 135–145)

## 2020-12-24 LAB — MAGNESIUM: Magnesium: 2.2 mg/dL (ref 1.7–2.4)

## 2020-12-24 MED ORDER — DILTIAZEM HCL 25 MG/5ML IV SOLN
5.0000 mg | Freq: Once | INTRAVENOUS | Status: AC
  Administered 2020-12-24: 5 mg via INTRAVENOUS
  Filled 2020-12-24: qty 5

## 2020-12-24 MED ORDER — DEXTROSE 50 % IV SOLN
INTRAVENOUS | Status: AC
  Filled 2020-12-24: qty 50

## 2020-12-24 MED ORDER — ENSURE MAX PROTEIN PO LIQD
11.0000 [oz_av] | Freq: Every day | ORAL | Status: DC
  Administered 2020-12-24 – 2020-12-25 (×2): 11 [oz_av] via ORAL
  Filled 2020-12-24: qty 330

## 2020-12-24 MED ORDER — ALUM & MAG HYDROXIDE-SIMETH 200-200-20 MG/5ML PO SUSP
30.0000 mL | Freq: Four times a day (QID) | ORAL | Status: DC | PRN
  Administered 2020-12-24: 30 mL via ORAL
  Filled 2020-12-24: qty 30

## 2020-12-24 MED ORDER — METHYLPREDNISOLONE SODIUM SUCC 40 MG IJ SOLR
40.0000 mg | Freq: Every day | INTRAMUSCULAR | Status: DC
  Administered 2020-12-25: 40 mg via INTRAVENOUS
  Filled 2020-12-24: qty 1

## 2020-12-24 MED ORDER — CYANOCOBALAMIN 1000 MCG/ML IJ SOLN
1000.0000 ug | Freq: Once | INTRAMUSCULAR | Status: AC
  Administered 2020-12-24: 1000 ug via INTRAMUSCULAR
  Filled 2020-12-24: qty 1

## 2020-12-24 NOTE — TOC Initial Note (Addendum)
Transition of Care [facility]) - Initial/Assessment Note    Patient Details  Name: Kristin [name] MRN: [id number] Date of Birth: 1950-05-31  [facility] CM/SW Contact:    Eileen Stanford, LCSW Phone Number: 12/24/2020, 2:40 PM  Clinical Narrative:  Pt is agreeable to [facility]. Pt states she has used [facility] in the past and would like to use them again. Pt states she lives with her daughter. Pt's daughter takes pt to doctor appointments. Pt states [facility] [place]. Pt sees a new PCP at [facility]. CSW has reached out to [facility] and and pt is active with RN. CSW asked them to add PT--accepted.              No DME needs.      Expected Discharge Plan: [place] Barriers to Discharge: Continued Medical Work up   Patient Goals and CMS Choice Patient states their goals for this hospitalization and ongoing recovery are:: to go home   Choice offered to / list presented to : Patient  Expected Discharge Plan and Services Expected Discharge Plan: [place] In-house Referral: Clinical Social Work   Post Acute Care Choice: [place] arrangements for the past 2 months: [place] Arranged: RN, PT [place] Agency: Cecilia Date [place]: 12/24/20 Time [place]: 1439 Representative spoke with at [place]: [place] Arrangements/Services Living arrangements for the past 2 months: [place] Lives with:: Adult Children Patient language and need for interpreter reviewed:: Yes Do you feel safe going back to the place where you live?: Yes      Need for Family Participation in Patient Care: Yes (Comment) Care giver support system in place?: Yes (comment)   Criminal Activity/Legal Involvement Pertinent to Current Situation/Hospitalization: No - Comment as needed  Activities of Daily Living Home  Assistive Devices/Equipment: Cane (specify quad or straight), CPAP, Oxygen, Walker (specify type) ADL Screening (condition at time of admission) Patient's cognitive ability adequate to safely complete daily activities?: Yes Is the patient deaf or have difficulty hearing?: No Does the patient have difficulty seeing, even when wearing glasses/contacts?: No Does the patient have difficulty concentrating, remembering, or making decisions?: No Patient able to express need for assistance with ADLs?: Yes Does the patient have difficulty dressing or bathing?: Yes Independently performs ADLs?: No Communication: Independent Dressing (OT): Needs assistance Is this a change from baseline?: Pre-admission baseline Grooming: Needs assistance Is this a change from baseline?: Pre-admission baseline Feeding: Independent Bathing: Needs assistance Is this a change from baseline?: Pre-admission baseline Toileting: Independent In/Out Bed: Independent Walks in Home: Needs assistance Is this a change from baseline?: Pre-admission baseline Does the patient have difficulty walking or climbing stairs?: Yes Weakness of Legs: Both Weakness of Arms/Hands: None  Permission Sought/Granted Permission sought to share information with : Family Supports Permission granted to share information with : Yes, Verbal Permission Granted  Share Information with NAME: [name]  Permission granted to share info w AGENCY: [facility] [place]  Permission granted to share info w Relationship: daughter     Emotional Assessment Appearance:: Appears stated age Attitude/Demeanor/Rapport: Engaged Affect (typically observed): Accepting Orientation: : Oriented to Self, Oriented to Place, Oriented to  Time, Oriented to Situation Alcohol /  Substance Use: Not Applicable Psych Involvement: No (comment)  Admission diagnosis:  Acute exacerbation of chronic obstructive pulmonary disease (COPD) ([place]) [J44.1] COPD exacerbation (HCC) [J44.1] Acute  respiratory failure with hypoxia and hypercapnia (HCC) [J96.01, J96.02] Acute hypoxemic respiratory failure (HCC) [J96.01] Type 2 diabetes mellitus without complication, without long-term current use of insulin ([place]) [E11.9] Atrial fibrillation, unspecified type ([place]) [I48.91] Chronic congestive heart failure, unspecified heart failure type [facility]) [I50.9] Patient Active Problem List   Diagnosis Date Noted   Acute hypoxemic respiratory failure ([place]) 12/23/2020   Acute exacerbation of chronic obstructive pulmonary disease (COPD) ([place]) 12/22/2020   Aspiration into airway    Paroxysmal atrial fibrillation ([place])    CAD (coronary artery disease) 09/26/2020   Hyperkalemia 09/26/2020   CHF exacerbation ([place]) 09/26/2020   Acute on chronic respiratory failure with hypoxia and hypercapnia ([place]) 09/25/2020   Bradycardia 09/25/2020   Lung cancer ([place]) 05/26/2020   Atrial flutter with rapid ventricular response ([place]) 05/26/2020   Demand ischemia ([place])    Acute on chronic heart failure with preserved ejection fraction (HFpEF) ([place])    Aortic stenosis-moderate to severe 03/2020    Obesity, Class III, BMI 40-49.9 (morbid obesity) ([place]) 03/29/2020   Chronic respiratory failure with hypoxia ([place]) 03/29/2020   OSA (obstructive sleep apnea) 03/29/2020   Essential hypertension 03/29/2020   Hypothyroidism 03/29/2020   Chest pain 03/29/2020   Elevated troponin 03/29/2020   Chronic diastolic (congestive) heart failure ([place]) 07/12/2018   Type 2 diabetes mellitus without complication ([place]) 48/27/0786   COPD exacerbation ([place]) 07/12/2018   Tobacco use 07/12/2018   Hypotension 07/12/2018   Respiratory failure ([place]) 07/04/2018   PCP:  [facility] Pharm[id number] [place] [place] [address] Phone: [phone] Fax: [phone]     Social Determinants of Health (SDOH) Interventions    Readmission Risk  Interventions Readmission Risk Prevention Plan 12/24/2020  Transportation Screening Complete  Medication Review (RN Care Manager) Complete  PCP or Specialist appointment within 3-5 days of discharge Complete  HRI or [place] Complete  SW Recovery Care/Counseling Consult Complete  [place] Not Applicable  Some recent data might be hidden

## 2020-12-24 NOTE — Progress Notes (Signed)
PROGRESS NOTE    Kristin [name]  GDJ:[id number] DOB: 1950/08/02 DOA: 12/22/2020 PCP: [place] [place]   Chief complaint.  Shortness of breath Brief Narrative:  Kristin [name] is a 70 y.o. 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 fib on Eliquis, who presented to [facility] ED from home due to acute respiratory distress. She was placed on BiPAP after arriving the hospital, she was also giving bronchodilator and IV steroids, she also receiving IV Lasix.   Assessment & Plan:   Active Problems:   Type 2 diabetes mellitus without complication (HCC)   COPD exacerbation (HCC)   Obesity, Class III, BMI 40-49.9 (morbid obesity) ([place])   Aortic stenosis-moderate to severe 03/2020   Acute on chronic heart failure with preserved ejection fraction (HFpEF) (HCC)   Acute on chronic respiratory failure with hypoxia and hypercapnia (HCC)   Paroxysmal atrial fibrillation ([place])   Acute exacerbation of chronic obstructive pulmonary disease (COPD) ([place])   Acute hypoxemic respiratory failure ([place])  #1.  Acute on chronic respiratory failure with hypoxemia and hypercapnia. Acute on chronic diastolic congestive heart failure. COPD exacerbation. Elevated troponin secondary to exacerbation congestive heart failure. Patient condition seem to be improving, but he feels status she has not back to baseline yet.  I will continue IV steroids, continue oral torsemide. Patient does not have infection, procalcitonin level of 0.1, I will discontinue Rocephin.  #2.  Acute kidney injury  Renal function still stable.  3.  Paroxysmal atrial  fibrillation. Severe aortic stenosis. Continue to follow.  Eliquis.  4.  Iron deficient anemia. Patient is on oral iron supplement.  B12 level borderline, will give a dose of this injected B12, check homocystine level.  #5.  Uncontrolled type 2 diabetes with hyperglycemia. Increased glucose due to steroids. I will decrease Solu-Medrol to once a day.    DVT prophylaxis: Eliquis Code Status: full Family Communication:  Disposition Plan:    Status is: Inpatient  Remains inpatient appropriate because:IV treatments appropriate due to intensity of illness or inability to take PO and Inpatient level of care appropriate due to severity of illness  Dispo:  Patient From: Home  Planned Disposition: Home with Health Care Svc  Medically stable for discharge: No          I/O last 3 completed shifts: In: 1603.3 [P.O.:1420; IV Piggyback:183.3] Out: 2900 [Urine:2900] Total I/O In: 360 [P.O.:360] Out: -      Consultants:  None  Procedures: None  Antimicrobials:None   Subjective: Still has some short of breath, chest tightness.  Has a cough, nonproductive. No fever chills No dysuria hematuria  No chest pain or palpitation. Abdominal pain or nausea vomiting.   Objective: Vitals:   12/24/20 0500 12/24/20 0726 12/24/20 0749 12/24/20 1142  BP:   127/69 139/77  Pulse:   67 66  Resp:   17 18  Temp:   98.2 F (36.8 C) 97.8 F (36.6 C)  TempSrc:      SpO2:  97% 96% 96%  Weight: 114.6 kg     Height:        Intake/Output Summary (Last 24 hours) at 12/24/2020 1251 Last data filed at 12/24/2020 1015 Gross per 24 hour  Intake 1263.33 ml  Output 1900 ml  Net -636.67 ml   Filed Weights   12/22/20 1014 12/24/20 0500  Weight: 111.4 kg 114.6 kg    Examination:  General exam: Appears calm and comfortable  Respiratory system: Decreased breathing sounds.  Respiratory effort normal. Cardiovascular system: S1 & S2 heard, RRR. No JVD, murmurs, rubs, gallops or clicks. No  pedal edema. Gastrointestinal system: Abdomen is nondistended, soft and nontender. No organomegaly or masses felt. Normal bowel sounds heard. Central nervous system: Alert and oriented. No focal neurological deficits. Extremities: Symmetric 5 x 5 power. Skin: No rashes, lesions or ulcers Psychiatry: Mood & affect appropriate.     Data Reviewed: I have personally reviewed following labs and imaging studies  CBC: Recent Labs  Lab 12/22/20 1016 12/23/20 0509 12/24/20 0629  WBC 14.0* 7.2 11.1*  NEUTROABS 11.1*  --  10.5*  HGB 10.6* 9.1* 9.6*  HCT 34.7* 29.3* 30.9*  MCV 97.2 95.4 94.2  PLT 277 227 528   Basic Metabolic Panel: Recent Labs  Lab 12/22/20 1016 12/23/20 0509 12/24/20 0629  NA 141 142 139  K 4.4 4.4 4.8  CL 100 97* 91*  CO2 33* 35* 35*  GLUCOSE 181* 206* 243*  BUN 32* 37* 41*  CREATININE 1.78* 1.81* 1.89*  CALCIUM 8.7* 8.8* 8.9  MG  --  2.4 2.2  PHOS  --  4.0  --    GFR: Estimated Creatinine Clearance: 34.1 mL/min (A) (by C-G formula based on SCr of 1.89 mg/dL (H)). Liver Function Tests: Recent Labs  Lab 12/22/20 1016  AST 24  ALT 17  ALKPHOS 36*  BILITOT 0.6  PROT 7.3  ALBUMIN 3.6   No results for input(s): LIPASE, AMYLASE in the last 168 hours. No results for input(s): AMMONIA in the last 168 hours. Coagulation Profile: No results for input(s): INR, PROTIME in the last 168 hours. Cardiac Enzymes: No results for input(s): CKTOTAL, CKMB, CKMBINDEX, TROPONINI in the last 168 hours. BNP (last 3 results) No results for input(s): PROBNP in the last 8760 hours. HbA1C: Recent Labs    12/23/20 0509  HGBA1C 5.4   CBG: Recent Labs  Lab 12/23/20 1318 12/23/20 1610 12/23/20 2106 12/24/20 0749 12/24/20 1143  GLUCAP 170* 124* 136* 235* 108*   Lipid Profile: No results for input(s): CHOL, HDL, LDLCALC, TRIG, CHOLHDL, LDLDIRECT in the last 72 hours. Thyroid Function Tests: No results for input(s): TSH, T4TOTAL, FREET4, T3FREE, THYROIDAB in the  last 72 hours. Anemia Panel: Recent Labs    12/23/20 1226 12/23/20 1411  VITAMINB12  --  208  TIBC 465*  --   IRON 29  --    Sepsis Labs: Recent Labs  Lab 12/23/20 0509  PROCALCITON <0.10    Recent Results (from the past 240 hour(s))  Resp Panel by RT-PCR (Flu A&B, Covid) Nasopharyngeal Swab     Status: None   Collection Time: 12/22/20 10:16 AM   Specimen: Nasopharyngeal Swab; Nasopharyngeal(NP) swabs in vial transport medium  Result Value Ref Range Status   SARS Coronavirus 2 by RT PCR NEGATIVE NEGATIVE Final    Comment: (NOTE) SARS-CoV-2 target nucleic acids are NOT DETECTED.  The SARS-CoV-2 RNA is generally detectable in upper respiratory specimens during the acute phase of infection. The lowest concentration of SARS-CoV-2 viral copies this assay can detect is 138 copies/mL. A negative result does not preclude SARS-Cov-2 infection and should not be used as the sole basis for treatment or other patient management decisions. A negative result may occur with  improper specimen collection/handling, submission of specimen other than  nasopharyngeal swab, presence of viral mutation(s) within the areas targeted by this assay, and inadequate number of viral copies(<138 copies/mL). A negative result must be combined with clinical observations, patient history, and epidemiological information. The expected result is Negative.  Fact Sheet for Patients:  EntrepreneurPulse.com.au  Fact Sheet for Healthcare Providers:  IncredibleEmployment.be  This test is no t yet approved or cleared by the [place] FDA and  has been authorized for detection and/or diagnosis of SARS-CoV-2 by FDA under an Emergency Use Authorization (EUA). This EUA will remain  in effect (meaning this test can be used) for the duration of the COVID-19 declaration under Section 564(b)(1) of the Act, 21 U.S.C.section 360bbb-3(b)(1), unless the authorization is terminated   or revoked sooner.       Influenza A by PCR NEGATIVE NEGATIVE Final   Influenza B by PCR NEGATIVE NEGATIVE Final    Comment: (NOTE) The Xpert Xpress SARS-CoV-2/FLU/RSV plus assay is intended as an aid in the diagnosis of influenza from Nasopharyngeal swab specimens and should not be used as a sole basis for treatment. Nasal washings and aspirates are unacceptable for Xpert Xpress SARS-CoV-2/FLU/RSV testing.  Fact Sheet for Patients: EntrepreneurPulse.com.au  Fact Sheet for Healthcare Providers: IncredibleEmployment.be  This test is not yet approved or cleared by the [place] FDA and has been authorized for detection and/or diagnosis of SARS-CoV-2 by FDA under an Emergency Use Authorization (EUA). This EUA will remain in effect (meaning this test can be used) for the duration of the COVID-19 declaration under Section 564(b)(1) of the Act, 21 U.S.C. section 360bbb-3(b)(1), unless the authorization is terminated or revoked.  Performed at [facility], [address] [place], [place] [address]   Blood culture (routine x 2)     Status: None (Preliminary result)   Collection Time: 12/22/20 12:23 PM   Specimen: BLOOD  Result Value Ref Range Status   Specimen Description BLOOD BLOOD RIGHT FOREARM  Final   Special Requests   Final    BOTTLES DRAWN AEROBIC AND ANAEROBIC Blood Culture adequate volume   Culture   Final    NO GROWTH 2 DAYS Performed at [facility], [address] [place], [place] [address]    Report Status PENDING  Incomplete  Blood culture (routine x 2)     Status: None (Preliminary result)   Collection Time: 12/22/20 12:23 PM   Specimen: BLOOD  Result Value Ref Range Status   Specimen Description BLOOD BLOOD LEFT FOREARM  Final   Special Requests   Final    BOTTLES DRAWN AEROBIC AND ANAEROBIC Blood Culture adequate volume   Culture   Final    NO GROWTH 2 DAYS Performed at [facility],  [address] [place], [place] [address]    Report Status PENDING  Incomplete         Radiology Studies: No results found.      Scheduled Meds:  apixaban  5 mg Oral BID   atorvastatin  80 mg Oral QHS   citalopram  40 mg Oral Daily   cyanocobalamin  1,000 mcg Intramuscular Once   dextrose       ezetimibe  10 mg Oral Daily   famotidine  40 mg Oral Daily   fenofibrate  160 mg Oral Daily   ferrous sulfate  325 mg Oral Daily   gabapentin  300 mg Oral BID   insulin aspart  0-5 Units Subcutaneous QHS   insulin aspart  0-9 Units Subcutaneous TID WC   ipratropium-albuterol  3 mL Nebulization Q6H   levothyroxine  100 mcg Oral Q0600   methylPREDNISolone (SOLU-MEDROL) injection  40 mg Intravenous BID   mometasone-formoterol  2 puff Inhalation BID   nicotine  21 mg Transdermal Daily   nystatin   Topical BID   pantoprazole  40 mg Oral BID   potassium chloride  20 mEq Oral Daily   senna-docusate  1 tablet Oral QHS   torsemide  40 mg Oral BID   Continuous Infusions:  cefTRIAXone (ROCEPHIN)  IV Stopped (12/23/20 1355)     LOS: 2 days    Time spent: 27 minutes    Sharen Hones, MD [facility]   To contact the attending provider between 7A-7P or the covering provider during after hours 7P-7A, please log into the web site [email or web address] and access using universal [place] password for that web site. If you do not have the password, please call the hospital operator.  12/24/2020, 12:51 PM

## 2020-12-24 NOTE — Progress Notes (Addendum)
Telemetry called to report patient rhythm in a-fib (pt does have hx of a-fib); then follow up call to report pt was in a-fib RVR with rate up to 180 sustained for a minute.  Found pt was up OOB ambulating to bathroom.  Patient back in bed, resting. Denies any chest pain, however reports lower abdomen pain/cramping.  Pt states this pain has been on-going since after she had dinner.  Secure message sent to Dr. Sidney Ace per above, will continue to monitor / await any new orders.

## 2020-12-24 NOTE — Progress Notes (Signed)
Physical Therapy Treatment Patient Details Name: Kristin [name] MRN: [id number] DOB: [birth year] Today's Date: 12/24/2020    History of Present Illness 70 y.o. 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 fib on Eliquis, who presented to [facility] ED from home due to acute respiratory distress. Pt's HR increases to 144 and SPO2 drops to 83% with minimal ambulation.     PT Comments    Patient is A and O x 4. Agreed to participate in therapy. Pt received in bathroom using toilet. Pt began to use bathroom and is able to disconnect her external catheter. Pt  demonstrates improve endurance and gait distance with decreased assistance and SOB. Pt overall progress well. PT will continue to provide skilled interventions to help pt return to her PLOF.   Follow Up Recommendations  Home health PT     Equipment Recommendations       Recommendations for Other Services       Precautions / Restrictions Precautions Precautions: Fall    Mobility  Bed Mobility Overal bed mobility: Independent Bed Mobility: Supine to Sit;Sit to Supine;Rolling Rolling: Independent   Supine to sit: Independent Sit to supine: Independent        Transfers Overall transfer level: Needs assistance Equipment used: Rolling walker (2 wheeled)   Sit to Stand: Supervision Stand pivot transfers: Supervision          Ambulation/Gait Ambulation/Gait assistance: Min guard Gait Distance (Feet): 100 Feet Assistive device: Rolling walker (2 wheeled) Gait Pattern/deviations: Decreased stride length Gait velocity: decreased       Stairs              Wheelchair Mobility    Modified Rankin (Stroke Patients Only)       Balance   Sitting-balance support: Feet supported Sitting balance-Leahy Scale: Good     Standing balance support: Bilateral upper extremity supported Standing balance-Leahy Scale: Good                              Cognition Arousal/Alertness: Awake/alert Behavior During Therapy: WFL for tasks assessed/performed Overall Cognitive Status: Within Functional Limits for tasks assessed                                        Exercises General Exercises - Lower Extremity Ankle Circles/Pumps: AROM Gluteal Sets: AROM;20 reps Heel Slides: AROM;20 reps Straight Leg Raises: AROM;20 reps    General Comments        Pertinent Vitals/Pain Pain Assessment: No/denies pain    Home Living                      Prior Function            PT Goals (current goals can now be found in the care plan section) Progress towards PT goals: Progressing toward goals    Frequency    Min 2X/week      PT Plan Current plan remains appropriate    Co-evaluation              AM-PAC PT "6 Clicks" Mobility   Outcome Measure  End of Session Equipment Utilized During Treatment: Gait belt Activity Tolerance: Patient limited by fatigue Patient left: in bed;with call bell/phone within reach;with family/visitor present Nurse Communication: Mobility status;Other (comment) PT Visit Diagnosis: Unsteadiness on feet (R26.81);Muscle weakness (generalized) (M62.81);Difficulty in walking, not elsewhere classified (R26.2)     Time: 0300-0326 PT Time Calculation (min) (ACUTE ONLY): 26 min  Charges:  $Gait Training: 8-22 mins $Therapeutic Exercise: 8-22 mins                    Joaquin Music PT DPT 4:32 PM,12/24/20

## 2020-12-24 NOTE — Progress Notes (Signed)
Initial Nutrition Assessment  DOCUMENTATION CODES:  Morbid obesity  INTERVENTION:  Continue current diet as ordered, encourage PO intake If pt interested in weight loss or lifestyle change, recommend consult to outpatient nutrition education Ensure Max po daily, each supplement provides 150 kcal and 30 grams of protein.   NUTRITION DIAGNOSIS:  Increased nutrient needs related to chronic illness (COPD, CHF) as evidenced by estimated needs.  GOAL:  Patient will meet greater than or equal to 90% of their needs  MONITOR:  PO intake, Supplement acceptance, I & O's, Weight trends  REASON FOR ASSESSMENT:  Consult Assessment of nutrition requirement/status  ASSESSMENT:  70 y.o. female with medical history significant for right lung carcinoma s/p radiation, asthma, ongoing tobacco use (1 pack/day), COPD on 3 L home O2, OSA on CPAP, HTN, HLD, type 2 DM, iron deficiency anemia, hypothyroidism, chronic anxiety/depression, severe aortic stenosis, CHF, and A. fib, who presented to ED from home due to acute respiratory distress. Workup consistent with CHF exacerbation  Pt initially requiring BiPAP to maintain O2 saturations, able to transition back to nasal canula.   Attempted to call pt on room phone, no answer at this time. Reviewed intake and weight hx. Pt has excellent intake of meals this admission and no weight loss is noted in chart. Recent weight gain likely related to fluid.   Pt met with CHF navigator yesterday. Pt reports weighing daily to monitor for fluid changes. Pt has an appointment with outpatient CHF clinic. If pt is interested in weight loss or diet adjustments, would also recommend a consult to outpatient nutrition services.  Average Meal Intake: 8/8-8/10: 100% intake x 3 recorded meals  Nutritionally Relevant Medications: Scheduled Meds:  atorvastatin  80 mg Oral QHS   cyanocobalamin  1,000 mcg Intramuscular Once   dextrose       ezetimibe  10 mg Oral Daily   famotidine   40 mg Oral Daily   fenofibrate  160 mg Oral Daily   ferrous sulfate  325 mg Oral Daily   insulin aspart  0-5 Units Subcutaneous QHS   insulin aspart  0-9 Units Subcutaneous TID WC   levothyroxine  100 mcg Oral Q0600   [START ON 12/25/2020] methylPREDNISolone (SOLU-MEDROL) injection  40 mg Intravenous Daily   pantoprazole  40 mg Oral BID   potassium chloride  20 mEq Oral Daily   senna-docusate  1 tablet Oral QHS   torsemide  40 mg Oral BID   PRN Meds: ondansetron, polyethylene glycol  Labs Reviewed: BUN 41, creatinine 1.89 SBG ranges from 108-243 mg/dL over the last 24 hours  NUTRITION - FOCUSED PHYSICAL EXAM: Defer to in-person assessment  Diet Order:   Diet Order             Diet heart healthy/carb modified Room service appropriate? Yes; Fluid consistency: Thin; Fluid restriction: 1800 mL Fluid  Diet effective now                   EDUCATION NEEDS:  Education needs have been addressed  Skin:  Skin Assessment: Reviewed RN Assessment  Last BM:  PTA  Height:  Ht Readings from Last 1 Encounters:  12/22/20 '5\' 2"'  (1.575 m)    Weight:  Wt Readings from Last 1 Encounters:  12/24/20 114.6 kg    Ideal Body Weight:  50 kg  BMI:  Body mass index is 46.21 kg/m.  Estimated Nutritional Needs:  Kcal:  1800-2000 kcal Protein:  90-100 g/d Fluid:  1.8L/d   Ranell Patrick, RD, LDN  Clinical Dietitian Pager on [place]

## 2020-12-24 NOTE — Progress Notes (Signed)
   12/24/20 1948  Assess: MEWS Score  Temp 98.8 F (37.1 C)  BP 132/84  Pulse Rate (!) 144 (a-fib, varying from 138 to 144)  Resp 20  Level of Consciousness Alert  SpO2 97 %  O2 Device Nasal Cannula  Patient Activity (if Appropriate) In bed  O2 Flow Rate (L/min) 3 L/min  Assess: MEWS Score  MEWS Temp 0  MEWS Systolic 0  MEWS Pulse 3  MEWS RR 0  MEWS LOC 0  MEWS Score 3  MEWS Score Color Yellow  Assess: if the MEWS score is Yellow or Red  Were vital signs taken at a resting state? Yes  Does the patient meet 2 or more of the SIRS criteria? No  MEWS guidelines implemented *See Row Information* Yes  Treat  MEWS Interventions Administered scheduled meds/treatments (see new orders  - Cardizem 5mg  IV)  Pain Scale 0-10  Pain Score 3  Pain Location Other (Comment) (lower stomach)  Pain Intervention(s) Medication (See eMAR) (PRN maalox)  Take Vital Signs  Increase Vital Sign Frequency  Yellow: Q 2hr X 2 then Q 4hr X 2, if remains yellow, continue Q 4hrs  Notify: Charge Nurse/RN  Name of Charge Nurse/RN Notified Holley Dexter  Date Charge Nurse/RN Notified 12/24/20  Time Charge Nurse/RN Notified 2032  Notify: Provider  Provider Name/Title Dr Eugenie Norrie  Date Provider Notified 12/24/20  Time Provider Notified 1955  Notification Type Page  Notification Reason Change in status  Provider response See new orders  Date of Provider Response 12/24/20  Time of Provider Response 2021  Assess: SIRS CRITERIA  SIRS Temperature  0  SIRS Pulse 1  SIRS Respirations  0  SIRS WBC 0  SIRS Score Sum  1

## 2020-12-24 NOTE — Progress Notes (Signed)
Cardizem 5mg  given was effective, pt HR has remained stable since administered.  Patient resting comfortably in bed; denies any discomfort.  Maalox also effective for upset stomach.  Pt requested ice cream.  Will continue to monitor

## 2020-12-24 NOTE — Progress Notes (Signed)
   12/24/20 2148  Assess: MEWS Score  Temp 98.3 F (36.8 C)  BP 140/65  Pulse Rate 65  Resp 18  SpO2 96 %  O2 Device Nasal Cannula  Patient Activity (if Appropriate) In bed  O2 Flow Rate (L/min) 3 L/min  Assess: MEWS Score  MEWS Temp 0  MEWS Systolic 0  MEWS Pulse 0  MEWS RR 0  MEWS LOC 0  MEWS Score 0  MEWS Score Color Green  Assess: if the MEWS score is Yellow or Red  Were vital signs taken at a resting state? Yes  Does the patient meet 2 or more of the SIRS criteria? No  MEWS guidelines implemented *See Row Information* No, vital signs rechecked  Treat  MEWS Interventions Other (Comment) (no intervention needed)  Pain Scale 0-10  Pain Score 0  Take Vital Signs  Increase Vital Sign Frequency  Yellow: Q 2hr X 2 then Q 4hr X 2, if remains yellow, continue Q 4hrs (2nd set of q2 hours.  pt stable)  Document  Patient Outcome Stabilized after interventions  Progress note created (see row info) Yes  Assess: SIRS CRITERIA  SIRS Temperature  0  SIRS Pulse 0  SIRS Respirations  0  SIRS WBC 0  SIRS Score Sum  0

## 2020-12-25 LAB — CBC WITH DIFFERENTIAL/PLATELET
Abs Immature Granulocytes: 0.04 10*3/uL (ref 0.00–0.07)
Basophils Absolute: 0.1 10*3/uL (ref 0.0–0.1)
Basophils Relative: 1 %
Eosinophils Absolute: 0.1 10*3/uL (ref 0.0–0.5)
Eosinophils Relative: 1 %
HCT: 31.9 % — ABNORMAL LOW (ref 36.0–46.0)
Hemoglobin: 10 g/dL — ABNORMAL LOW (ref 12.0–15.0)
Immature Granulocytes: 0 %
Lymphocytes Relative: 12 %
Lymphs Abs: 1.2 10*3/uL (ref 0.7–4.0)
MCH: 28.8 pg (ref 26.0–34.0)
MCHC: 31.3 g/dL (ref 30.0–36.0)
MCV: 91.9 fL (ref 80.0–100.0)
Monocytes Absolute: 1 10*3/uL (ref 0.1–1.0)
Monocytes Relative: 10 %
Neutro Abs: 7.5 10*3/uL (ref 1.7–7.7)
Neutrophils Relative %: 76 %
Platelets: 237 10*3/uL (ref 150–400)
RBC: 3.47 MIL/uL — ABNORMAL LOW (ref 3.87–5.11)
RDW: 14.9 % (ref 11.5–15.5)
WBC: 9.9 10*3/uL (ref 4.0–10.5)
nRBC: 0 % (ref 0.0–0.2)

## 2020-12-25 LAB — BASIC METABOLIC PANEL
Anion gap: 9 (ref 5–15)
BUN: 50 mg/dL — ABNORMAL HIGH (ref 8–23)
CO2: 41 mmol/L — ABNORMAL HIGH (ref 22–32)
Calcium: 9 mg/dL (ref 8.9–10.3)
Chloride: 91 mmol/L — ABNORMAL LOW (ref 98–111)
Creatinine, Ser: 1.69 mg/dL — ABNORMAL HIGH (ref 0.44–1.00)
GFR, Estimated: 33 mL/min — ABNORMAL LOW (ref >60)
Glucose, Bld: 111 mg/dL — ABNORMAL HIGH (ref 70–99)
Potassium: 4.1 mmol/L (ref 3.5–5.1)
Sodium: 141 mmol/L (ref 135–145)

## 2020-12-25 LAB — GLUCOSE, CAPILLARY
Glucose-Capillary: 93 mg/dL (ref 70–99)
Glucose-Capillary: 149 mg/dL — ABNORMAL HIGH (ref 70–99)

## 2020-12-25 LAB — HOMOCYSTEINE: Homocysteine: 21.4 umol/L — ABNORMAL HIGH (ref 0.0–17.2)

## 2020-12-25 LAB — MAGNESIUM: Magnesium: 2.3 mg/dL (ref 1.7–2.4)

## 2020-12-25 MED ORDER — PREDNISONE 10 MG PO TABS: ORAL_TABLET | ORAL | 0 refills | Status: DC

## 2020-12-25 MED ORDER — LACTULOSE 10 GM/15ML PO SOLN
20.0000 g | Freq: Once | ORAL | Status: AC
  Administered 2020-12-25: 20 g via ORAL
  Filled 2020-12-25: qty 30

## 2020-12-25 MED ORDER — TORSEMIDE 40 MG PO TABS: 40.0000 mg | ORAL_TABLET | Freq: Every day | ORAL | Status: DC

## 2020-12-25 NOTE — Progress Notes (Signed)
No change in previous assessment. Patient alert and oriented. No complaints of pain. She is being discharged home today. She has her home 02 tank with her and was wheeled down to the lobby with her oxygen on 3 liters which is her baseline.

## 2020-12-25 NOTE — Progress Notes (Signed)
Telemetry notified this RN that patient had a 0.1 second pause @ 0033.  This RN was in the room with patient at the time, helping patient to reapply her BIPAP.  Patient states she had to answer her phone and removed BIPAP and was not wearing oxygen while on the phone (baseline 3L typically).  Patient not in distress, denies any pain.  Pt requested PRN melatonin for sleep.  Will continue to monitor

## 2020-12-25 NOTE — Progress Notes (Signed)
   12/24/20 2347  Assess: MEWS Score  Temp 97.8 F (36.6 C)  BP (!) 134/52  Pulse Rate 64  Resp 18  SpO2 99 %  O2 Device Bi-PAP  Patient Activity (if Appropriate) In bed  Assess: MEWS Score  MEWS Temp 0  MEWS Systolic 0  MEWS Pulse 0  MEWS RR 0  MEWS LOC 0  MEWS Score 0  MEWS Score Color Green  Assess: if the MEWS score is Yellow or Red  Were vital signs taken at a resting state? Yes  Focused Assessment No change from prior assessment  Does the patient meet 2 or more of the SIRS criteria? No  MEWS guidelines implemented *See Row Information* No, previously yellow, continue vital signs every 4 hours  Treat  MEWS Interventions Other (Comment) (no intervention required)  Pain Scale 0-10  Pain Score 0  Document  Patient Outcome Stabilized after interventions  Assess: SIRS CRITERIA  SIRS Temperature  0  SIRS Pulse 0  SIRS Respirations  0  SIRS WBC 0  SIRS Score Sum  0

## 2020-12-25 NOTE — Progress Notes (Signed)
Physical Therapy Treatment Patient Details Name: Kristin [name] MRN: [id number] DOB: [birth year] Today's Date: 12/25/2020    History of Present Illness 70 y.o. 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 fib on Eliquis, who presented to [facility] ED from home due to acute respiratory distress. Pt's HR increases to 144 and SPO2 drops to 83% with minimal ambulation.    PT Comments    Patient is A&O x 4 resting in bed without distress. PT spent time to educate pt and Daughter regarding pt's condition and the significance of Cardiopulmonary rehab in Outpatient settings after the [facility] PT is completed to improve pt's QOL and reduce the rate of rehospitalization. Pt sat on the EOB and took side steps to become comfortable in the bed with Sup of 1 using FW. Pt Ind with bed mobility. Pt provided with the pamphlet for information to seek the care. Pt demonstrates good understanding of the education and increased insight of the her condition. Pt plans to return home and will benefit  from [facility].  Follow Up Recommendations  Home health PT     Equipment Recommendations       Recommendations for Other Services       Precautions / Restrictions Precautions Precautions: Fall    Mobility  Bed Mobility Overal bed mobility: Independent Bed Mobility: Supine to Sit;Sit to Supine;Rolling Rolling: Independent   Supine to sit: Independent Sit to supine: Independent   General bed mobility comments: Indepedent with bed mobility.    Transfers Overall transfer level: Independent Equipment used: Rolling walker (2 wheeled) Transfers: Sit to/from  Stand Sit to Stand: Independent            Ambulation/Gait                 Stairs             Wheelchair Mobility    Modified Rankin (Stroke Patients Only)       Balance Overall balance assessment: Modified Independent   Sitting balance-Leahy Scale: Good       Standing balance-Leahy Scale: Good Standing balance comment: with FW                            Cognition Arousal/Alertness: Awake/alert Behavior During Therapy: WFL for tasks assessed/performed Overall Cognitive Status: Within Functional Limits for tasks assessed                                 General Comments: Pleasant and cooperative      Exercises      General Comments        Pertinent Vitals/Pain Pain Assessment: No/denies pain    Home Living Family/patient expects to be discharged to:: Private residence Living Arrangements: Children Available Help at Discharge: Family;Available 24 hours/day Type of Home: House Home Access: Ramped entrance            Prior Function            PT Goals (current goals can now be found in the care plan section) Progress towards PT goals: Progressing toward goals    Frequency    Min 2X/week  PT Plan Current plan remains appropriate    Co-evaluation              AM-PAC PT "6 Clicks" Mobility   Outcome Measure                   End of Session     Patient left: in bed;with family/visitor present Nurse Communication: Mobility status PT Visit Diagnosis: Unsteadiness on feet (R26.81);Muscle weakness (generalized) (M62.81);Difficulty in walking, not elsewhere classified (R26.2)     Time: 7573-2256 PT Time Calculation (min) (ACUTE ONLY): 11 min  Charges:  $Therapeutic Activity: 8-22 mins                     Leaman Abe PT DPT 1:05 PM,12/25/20

## 2020-12-25 NOTE — Plan of Care (Addendum)
Nutrition Education Note  RD consulted for nutrition education regarding heart healthy/carbohydrate controlled diet.   70 y.o. 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 fib on Eliquis who presented to [facility] ED from home due to acute respiratory distress.  Met with pt in room today. Pt is known to this RD from previous diet educations provided during previous admissions. Pt's main questions today are regarding a low sodium diet.   RD provided "Heart Healthy/Carbohydrate Controlled Diet" handout from the Academy of [employer]etics. Reviewed patient's dietary recall. Provided examples on ways to decrease sodium intake in diet. Discouraged intake of processed foods and use of salt shaker. Encouraged fresh fruits and vegetables as well as whole grain sources of carbohydrates to maximize fiber intake.   RD discussed why it is important for patient to adhere to diet recommendations, and emphasized the role of fluids, foods to avoid, and importance of weighing self daily. Teach back method used.  Expect fair compliance.  Body mass index is 45.73 kg/m. Pt meets criteria for morbid obesity based on current BMI.  Current diet order is HH/CHO modified, patient is consuming approximately 90-100% of meals at this time. Labs and medications reviewed. Pt to discharge today.   RD following this patient    Koleen Distance MS, RD, LDN Please refer to [facility] for RD and/or RD on-call/weekend/after hours pager

## 2020-12-25 NOTE — TOC Transition Note (Signed)
Transition of Care [facility]) - CM/SW Discharge Note   Patient Details  Name: Kristin [name] MRN: [id number] Date of Birth: Oct 10, 1950  [facility] CM/SW Contact:  Beverly Sessions, RN Phone Number: 12/25/2020, 10:00 AM   Clinical Narrative:     Patient to discharge home today Notified  Adriane with [facility] of discharge  Daughter at bedside and to transport at discharge Patient confirms that daughter has portable O2 for transport   Final next level of care: Home w Home Health Services Barriers to Discharge: Barriers Resolved   Patient Goals and CMS Choice Patient states their goals for this hospitalization and ongoing recovery are:: to go home   Choice offered to / list presented to : Patient  Discharge Placement                       Discharge Plan and Services In-house Referral: Clinical Social Work   Post Acute Care Choice: Home Health                    HH Arranged: RN, PT [facility] Agency: [place] Date [place]: 12/25/20 Time [place]: 1439 Representative spoke with at [place]: [place]) Interventions     Readmission Risk Interventions Readmission Risk Prevention Plan 12/24/2020  Transportation Screening Complete  Medication Review Press photographer) Complete  PCP or Specialist appointment within 3-5 days of discharge Complete  HRI or Home Care Consult Complete  SW Recovery Care/Counseling Consult Complete  [place] Not Applicable  Some recent data might be hidden

## 2020-12-25 NOTE — Discharge Summary (Addendum)
Physician Discharge Summary  Patient ID: Kristin [name] MRN: [id number] DOB/AGE: 69/27/1952 69 y.o.  Admit date: 12/22/2020 Discharge date: 12/25/2020  Admission Diagnoses:  Discharge Diagnoses:  Active Problems:   Type 2 diabetes mellitus without complication (HCC)   COPD exacerbation (HCC)   Obesity, Class III, BMI 40-49.9 (morbid obesity) ([place])   Aortic stenosis-moderate to severe 03/2020   Acute on chronic heart failure with preserved ejection fraction (HFpEF) (HCC)   Acute on chronic respiratory failure with hypoxia and hypercapnia (HCC)   Paroxysmal atrial fibrillation (HCC)   Acute exacerbation of chronic obstructive pulmonary disease (COPD) ([place])   Acute hypoxemic respiratory failure (HCC)   Discharged Condition: good  Hospital Course:  Kristin [name] is a 70 y.o. 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 fib on Eliquis, who presented to [facility] ED from home due to acute respiratory distress. She was placed on BiPAP after arriving the hospital, she was also giving bronchodilator and IV steroids, she also receiving IV Lasix.    #1.  Acute on chronic respiratory failure with hypoxemia and hypercapnia. Acute on chronic diastolic congestive heart failure. COPD exacerbation. Elevated troponin secondary to exacerbation congestive heart failure. Patient condition seem to be improving, condition has back to baseline.  Currently she no longer has short of breath, she is back on 3 L oxygen.  Continue steroid taper. She was seen by physical therapy, recommended home care with physical therapy. Has discussed with patient  daughter, patient is medically stable to be discharged.  Patient be followed by her PCP in 1 week, cardiology in 2 weeks.  Patient had episode of "chest pain" last night.  She described the pain localized in the upper stomach, lasted about 15 minutes, she was also nauseated.  She has not had a bowel movement for 3 days, she will be given a dose of lactulose before discharge.  The pain appears to be secondary to upper stomach discomfort, troponin stable.  #2.  Acute kidney injury  Renal function improving after diuretics   3.  Paroxysmal atrial fibrillation. Severe aortic stenosis. Continue to follow.  Eliquis.  4.  Iron deficient anemia. Patient is on oral iron supplement.  B12 level borderline at a 208.  She received B12 injection.  Homocystine level still pending, please follow-up on results with next office visit.  #5.  Uncontrolled type 2 diabetes with hyperglycemia. Increased glucose due to steroids. Resumed home regimen.  Gradually taper down steroids.  Consults: None  Significant Diagnostic Studies:   PORTABLE CHEST 1 VIEW   COMPARISON:  10/29/2020   FINDINGS: Chronic interstitial changes with suspected superimposed edema. No significant pleural effusion. Stable cardiomediastinal contours. No pneumothorax.   IMPRESSION: Chronic interstitial changes with suspected superimposed edema.     Electronically Signed   By: Macy Mis M.D.   On: 12/22/2020 10:43   Treatments: Diuretics, steroids  Discharge Exam: Blood pressure (!) 134/46, pulse 63, temperature 97.7 F (36.5 C), resp. rate 18, height 5\' 2"  (1.575 m), weight 113.4 kg, SpO2 98 %. General appearance: alert and cooperative Resp: clear to auscultation bilaterally Cardio: regular rate and rhythm, S1, S2 normal, no murmur, click, rub or gallop GI: soft, non-tender; bowel sounds normal; no masses,  no organomegaly Extremities: extremities  normal, atraumatic, no cyanosis or edema  Disposition: Discharge  disposition: 01-Home or Self Care       Discharge Instructions     Diet - low sodium heart healthy   Complete by: As directed    Increase activity slowly   Complete by: As directed       Allergies as of 12/25/2020       Reactions   Altace [ramipril] Swelling        Medication List     STOP taking these medications    ezetimibe 10 MG tablet Commonly known as: ZETIA   Movantik 25 MG Tabs tablet Generic drug: naloxegol oxalate   sitaGLIPtin 100 MG tablet Commonly known as: JANUVIA       TAKE these medications    albuterol 108 (90 Base) MCG/ACT inhaler Commonly known as: VENTOLIN HFA Inhale 2 puffs into the lungs every 4 (four) hours as needed for wheezing or shortness of breath.   amiodarone 200 MG tablet Commonly known as: PACERONE Take 1 tablet (200 mg total) by mouth daily.   apixaban 5 MG Tabs tablet Commonly known as: ELIQUIS Take 1 tablet (5 mg total) by mouth 2 (two) times daily.   atorvastatin 80 MG tablet Commonly known as: LIPITOR Take 1 tablet (80 mg total) by mouth at bedtime.   carvedilol 3.125 MG tablet Commonly known as: COREG Take 1 tablet (3.125 mg total) by mouth 2 (two) times daily.   citalopram 40 MG tablet Commonly known as: CELEXA Take 40 mg by mouth daily.   docusate sodium 100 MG capsule Commonly known as: COLACE Take 100 mg by mouth at bedtime as needed for mild constipation or moderate constipation.   famotidine 40 MG tablet Commonly known as: PEPCID Take 40 mg by mouth at bedtime.   fenofibrate 160 MG tablet Take 160 mg by mouth daily.   ferrous sulfate 325 (65 FE) MG tablet Take 325 mg by mouth daily.   gabapentin 600 MG tablet Commonly known as: NEURONTIN Take 600 mg by mouth 2 (two) times daily.   hydrOXYzine 50 MG capsule Commonly known as: VISTARIL Take 50 mg by mouth every 6 (six) hours as needed for anxiety.   ipratropium-albuterol 0.5-2.5 (3) MG/3ML Soln Commonly known as: DUONEB Take 3 mLs by  nebulization 4 (four) times daily.   irbesartan 300 MG tablet Commonly known as: Avapro Take 0.5 tablets (150 mg total) by mouth daily. What changed: how much to take   levothyroxine 75 MCG tablet Commonly known as: SYNTHROID Take 75 mcg by mouth daily before breakfast.   lidocaine 5 % Commonly known as: LIDODERM Place 3 patches onto the skin daily. (Remove after 12 hours and keep off for 12 hours)   Melatonin 10 MG Tabs Take 10 mg by mouth at bedtime as needed (sleep).   metFORMIN 500 MG tablet Commonly known as: GLUCOPHAGE Take 500 mg by mouth 2 (two) times daily.   nicotine 21 mg/24hr patch Commonly known as: NICODERM CQ - dosed in mg/24 hours Place 1 patch (21 mg total) onto the skin daily.   oxyCODONE-acetaminophen 10-325 MG tablet Commonly known as: PERCOCET Take 1 tablet by mouth 4 (four) times daily as needed for pain.   pantoprazole 40 MG tablet Commonly known as: PROTONIX Take 40 mg by mouth 2 (two) times daily.   predniSONE 10 MG tablet Commonly known as: DELTASONE Take 4 tablets (40 mg total) by mouth daily for 3 days, THEN 2 tablets (20 mg total) daily for 3 days,  THEN 1 tablet (10 mg total) daily for 3 days. Start taking on: December 25, 2020   Tiotropium Bromide Monohydrate 2.5 MCG/ACT Aers Inhale 2 puffs into the lungs daily.   Torsemide 40 MG Tabs Take 40 mg by mouth daily. Alternating with 20 mg by mouth every other day What changed: when to take this   Vitamin D (Ergocalciferol) 1.25 MG (50000 UNIT) Caps capsule Commonly known as: DRISDOL Take 50,000 Units by mouth once a week.        Follow-up Information     [facility] Follow up in 1 week(s).   Contact information: [place] [address] [phone]         Minna Merritts, MD Follow up in 2 week(s).   Specialty: Cardiology Contact information: [place] [address] [phone]                32 minutes Signed: Sharen Hones 12/25/2020, 9:47 AM

## 2020-12-27 LAB — CULTURE, BLOOD (ROUTINE X 2)
Culture: NO GROWTH
Culture: NO GROWTH
Special Requests: ADEQUATE
Special Requests: ADEQUATE

## 2020-12-29 ENCOUNTER — Ambulatory Visit: Payer: 59 | Admitting: Radiation Oncology

## 2020-12-29 ENCOUNTER — Telehealth: Payer: Self-pay | Admitting: *Deleted

## 2020-12-29 NOTE — Telephone Encounter (Signed)
RETURNED PATIENT'S DAUGHTER'S PHONE CALL, SPOKE WITH PATIENT'S Kristin [name], Kristin [name]

## 2020-12-29 NOTE — Telephone Encounter (Signed)
CALLED PATIENT'S DAUGHTER- JUDY MASHBURN TO INFORM THAT CT HAS BEEN RESCHEDULED FOR 01-02-21- ARRIVAL TIME- 7:15 AM @ [facility], NO RESTRICTIONS TO TEST, PATIENT TO FU WITH DR. KINARD FOR RESULTS ON 01-05-21 @  4:15 PM,, SPOKE WITH PATIENT'S DAUGHTER- JUDY MASHBURN AND SHE IS AWARE OF THESE APPTS.

## 2020-12-30 ENCOUNTER — Emergency Department: Payer: 59

## 2020-12-30 ENCOUNTER — Encounter: Payer: Self-pay | Admitting: Intensive Care

## 2020-12-30 ENCOUNTER — Other Ambulatory Visit: Payer: Self-pay

## 2020-12-30 ENCOUNTER — Inpatient Hospital Stay
Admission: EM | Admit: 2020-12-30 | Discharge: 2021-01-02 | DRG: 314 | Disposition: A | Discharge status: Home Health | Payer: 59 | Attending: Obstetrics and Gynecology | Admitting: Obstetrics and Gynecology

## 2020-12-30 DIAGNOSIS — I700 Atherosclerosis of aorta: Secondary | ICD-10-CM | POA: Diagnosis present

## 2020-12-30 DIAGNOSIS — Z79890 Hormone replacement therapy (postmenopausal): Secondary | ICD-10-CM

## 2020-12-30 DIAGNOSIS — I495 Sick sinus syndrome: Secondary | ICD-10-CM

## 2020-12-30 DIAGNOSIS — I358 Other nonrheumatic aortic valve disorders: Secondary | ICD-10-CM | POA: Diagnosis present

## 2020-12-30 DIAGNOSIS — K8020 Calculus of gallbladder without cholecystitis without obstruction: Secondary | ICD-10-CM | POA: Diagnosis present

## 2020-12-30 DIAGNOSIS — J9611 Chronic respiratory failure with hypoxia: Secondary | ICD-10-CM | POA: Diagnosis present

## 2020-12-30 DIAGNOSIS — E860 Dehydration: Secondary | ICD-10-CM | POA: Diagnosis present

## 2020-12-30 DIAGNOSIS — K529 Noninfective gastroenteritis and colitis, unspecified: Secondary | ICD-10-CM | POA: Diagnosis present

## 2020-12-30 DIAGNOSIS — Z809 Family history of malignant neoplasm, unspecified: Secondary | ICD-10-CM

## 2020-12-30 DIAGNOSIS — K760 Fatty (change of) liver, not elsewhere classified: Secondary | ICD-10-CM | POA: Diagnosis present

## 2020-12-30 DIAGNOSIS — Z85118 Personal history of other malignant neoplasm of bronchus and lung: Secondary | ICD-10-CM

## 2020-12-30 DIAGNOSIS — Z20822 Contact with and (suspected) exposure to covid-19: Secondary | ICD-10-CM | POA: Diagnosis present

## 2020-12-30 DIAGNOSIS — F1190 Opioid use, unspecified, uncomplicated: Secondary | ICD-10-CM

## 2020-12-30 DIAGNOSIS — Z9981 Dependence on supplemental oxygen: Secondary | ICD-10-CM

## 2020-12-30 DIAGNOSIS — Z6841 Body Mass Index (BMI) 40.0 and over, adult: Secondary | ICD-10-CM

## 2020-12-30 DIAGNOSIS — I959 Hypotension, unspecified: Secondary | ICD-10-CM | POA: Diagnosis not present

## 2020-12-30 DIAGNOSIS — N281 Cyst of kidney, acquired: Secondary | ICD-10-CM | POA: Diagnosis present

## 2020-12-30 DIAGNOSIS — Z7901 Long term (current) use of anticoagulants: Secondary | ICD-10-CM

## 2020-12-30 DIAGNOSIS — I4892 Unspecified atrial flutter: Secondary | ICD-10-CM | POA: Diagnosis present

## 2020-12-30 DIAGNOSIS — Z923 Personal history of irradiation: Secondary | ICD-10-CM

## 2020-12-30 DIAGNOSIS — D631 Anemia in chronic kidney disease: Secondary | ICD-10-CM | POA: Diagnosis present

## 2020-12-30 DIAGNOSIS — N189 Chronic kidney disease, unspecified: Secondary | ICD-10-CM

## 2020-12-30 DIAGNOSIS — R001 Bradycardia, unspecified: Secondary | ICD-10-CM | POA: Diagnosis not present

## 2020-12-30 DIAGNOSIS — F329 Major depressive disorder, single episode, unspecified: Secondary | ICD-10-CM | POA: Diagnosis present

## 2020-12-30 DIAGNOSIS — E039 Hypothyroidism, unspecified: Secondary | ICD-10-CM | POA: Diagnosis present

## 2020-12-30 DIAGNOSIS — J441 Chronic obstructive pulmonary disease with (acute) exacerbation: Secondary | ICD-10-CM | POA: Diagnosis present

## 2020-12-30 DIAGNOSIS — I5023 Acute on chronic systolic (congestive) heart failure: Secondary | ICD-10-CM | POA: Diagnosis not present

## 2020-12-30 DIAGNOSIS — Z8249 Family history of ischemic heart disease and other diseases of the circulatory system: Secondary | ICD-10-CM

## 2020-12-30 DIAGNOSIS — Z7984 Long term (current) use of oral hypoglycemic drugs: Secondary | ICD-10-CM

## 2020-12-30 DIAGNOSIS — Z79899 Other long term (current) drug therapy: Secondary | ICD-10-CM

## 2020-12-30 DIAGNOSIS — N1832 Chronic kidney disease, stage 3b: Secondary | ICD-10-CM | POA: Diagnosis present

## 2020-12-30 DIAGNOSIS — K219 Gastro-esophageal reflux disease without esophagitis: Secondary | ICD-10-CM | POA: Diagnosis present

## 2020-12-30 DIAGNOSIS — I2510 Atherosclerotic heart disease of native coronary artery without angina pectoris: Secondary | ICD-10-CM | POA: Diagnosis present

## 2020-12-30 DIAGNOSIS — G2581 Restless legs syndrome: Secondary | ICD-10-CM | POA: Diagnosis present

## 2020-12-30 DIAGNOSIS — I5033 Acute on chronic diastolic (congestive) heart failure: Secondary | ICD-10-CM | POA: Diagnosis present

## 2020-12-30 DIAGNOSIS — I498 Other specified cardiac arrhythmias: Secondary | ICD-10-CM

## 2020-12-30 DIAGNOSIS — I480 Paroxysmal atrial fibrillation: Secondary | ICD-10-CM

## 2020-12-30 DIAGNOSIS — I272 Pulmonary hypertension, unspecified: Secondary | ICD-10-CM | POA: Diagnosis present

## 2020-12-30 DIAGNOSIS — G4733 Obstructive sleep apnea (adult) (pediatric): Secondary | ICD-10-CM | POA: Diagnosis present

## 2020-12-30 DIAGNOSIS — T502X5A Adverse effect of carbonic-anhydrase inhibitors, benzothiadiazides and other diuretics, initial encounter: Secondary | ICD-10-CM | POA: Diagnosis present

## 2020-12-30 DIAGNOSIS — C3490 Malignant neoplasm of unspecified part of unspecified bronchus or lung: Secondary | ICD-10-CM | POA: Diagnosis present

## 2020-12-30 DIAGNOSIS — R531 Weakness: Secondary | ICD-10-CM | POA: Diagnosis not present

## 2020-12-30 DIAGNOSIS — Z888 Allergy status to other drugs, medicaments and biological substances status: Secondary | ICD-10-CM

## 2020-12-30 DIAGNOSIS — I350 Nonrheumatic aortic (valve) stenosis: Secondary | ICD-10-CM

## 2020-12-30 DIAGNOSIS — F419 Anxiety disorder, unspecified: Secondary | ICD-10-CM | POA: Diagnosis present

## 2020-12-30 DIAGNOSIS — E1151 Type 2 diabetes mellitus with diabetic peripheral angiopathy without gangrene: Secondary | ICD-10-CM | POA: Diagnosis present

## 2020-12-30 DIAGNOSIS — E041 Nontoxic single thyroid nodule: Secondary | ICD-10-CM | POA: Diagnosis present

## 2020-12-30 DIAGNOSIS — K5730 Diverticulosis of large intestine without perforation or abscess without bleeding: Secondary | ICD-10-CM | POA: Diagnosis present

## 2020-12-30 DIAGNOSIS — I471 Supraventricular tachycardia: Secondary | ICD-10-CM | POA: Diagnosis present

## 2020-12-30 DIAGNOSIS — N179 Acute kidney failure, unspecified: Secondary | ICD-10-CM

## 2020-12-30 DIAGNOSIS — E1122 Type 2 diabetes mellitus with diabetic chronic kidney disease: Secondary | ICD-10-CM | POA: Diagnosis present

## 2020-12-30 DIAGNOSIS — E1142 Type 2 diabetes mellitus with diabetic polyneuropathy: Secondary | ICD-10-CM | POA: Diagnosis present

## 2020-12-30 DIAGNOSIS — Z87891 Personal history of nicotine dependence: Secondary | ICD-10-CM

## 2020-12-30 LAB — BASIC METABOLIC PANEL
Anion gap: 12 (ref 5–15)
BUN: 67 mg/dL — ABNORMAL HIGH (ref 8–23)
CO2: 31 mmol/L (ref 22–32)
Calcium: 8.6 mg/dL — ABNORMAL LOW (ref 8.9–10.3)
Chloride: 94 mmol/L — ABNORMAL LOW (ref 98–111)
Creatinine, Ser: 2.47 mg/dL — ABNORMAL HIGH (ref 0.44–1.00)
GFR, Estimated: 21 mL/min — ABNORMAL LOW (ref >60)
Glucose, Bld: 140 mg/dL — ABNORMAL HIGH (ref 70–99)
Potassium: 4.8 mmol/L (ref 3.5–5.1)
Sodium: 137 mmol/L (ref 135–145)

## 2020-12-30 LAB — RESP PANEL BY RT-PCR (FLU A&B, COVID) ARPGX2
Influenza A by PCR: NEGATIVE
Influenza B by PCR: NEGATIVE
SARS Coronavirus 2 by RT PCR: NEGATIVE

## 2020-12-30 LAB — CBC
HCT: 33.9 % — ABNORMAL LOW (ref 36.0–46.0)
Hemoglobin: 10.6 g/dL — ABNORMAL LOW (ref 12.0–15.0)
MCH: 28.9 pg (ref 26.0–34.0)
MCHC: 31.3 g/dL (ref 30.0–36.0)
MCV: 92.4 fL (ref 80.0–100.0)
Platelets: 248 10*3/uL (ref 150–400)
RBC: 3.67 MIL/uL — ABNORMAL LOW (ref 3.87–5.11)
RDW: 15.1 % (ref 11.5–15.5)
WBC: 11.2 10*3/uL — ABNORMAL HIGH (ref 4.0–10.5)
nRBC: 0 % (ref 0.0–0.2)

## 2020-12-30 LAB — URINALYSIS, COMPLETE (UACMP) WITH MICROSCOPIC
Bacteria, UA: NONE SEEN
Bilirubin Urine: NEGATIVE
Glucose, UA: NEGATIVE mg/dL
Hgb urine dipstick: NEGATIVE
Ketones, ur: NEGATIVE mg/dL
Leukocytes,Ua: NEGATIVE
Nitrite: NEGATIVE
Protein, ur: NEGATIVE mg/dL
Specific Gravity, Urine: 1.009 (ref 1.005–1.030)
pH: 6 (ref 5.0–8.0)

## 2020-12-30 LAB — LACTIC ACID, PLASMA: Lactic Acid, Venous: 0.9 mmol/L (ref 0.5–1.9)

## 2020-12-30 LAB — TROPONIN I (HIGH SENSITIVITY)
Troponin I (High Sensitivity): 10 ng/L (ref <18)
Troponin I (High Sensitivity): 10 ng/L (ref <18)

## 2020-12-30 LAB — BRAIN NATRIURETIC PEPTIDE: B Natriuretic Peptide: 204.5 pg/mL — ABNORMAL HIGH (ref 0.0–100.0)

## 2020-12-30 MED ORDER — OXYCODONE-ACETAMINOPHEN 10-325 MG PO TABS: 1.0000 | ORAL_TABLET | Freq: Four times a day (QID) | ORAL | Status: DC | PRN

## 2020-12-30 MED ORDER — ONDANSETRON HCL 4 MG/2ML IJ SOLN: 4.0000 mg | Freq: Four times a day (QID) | INTRAMUSCULAR | Status: DC | PRN

## 2020-12-30 MED ORDER — SODIUM CHLORIDE 0.9 % IV BOLUS
1000.0000 mL | Freq: Once | INTRAVENOUS | Status: AC
  Administered 2020-12-30: 1000 mL via INTRAVENOUS

## 2020-12-30 MED ORDER — VITAMIN D (ERGOCALCIFEROL) 1.25 MG (50000 UNIT) PO CAPS: 50000.0000 [IU] | ORAL_CAPSULE | ORAL | Status: DC

## 2020-12-30 MED ORDER — CALCIUM GLUCONATE-NACL 1-0.675 GM/50ML-% IV SOLN
1.0000 g | Freq: Once | INTRAVENOUS | Status: AC
  Administered 2020-12-30: 1000 mg via INTRAVENOUS
  Filled 2020-12-30: qty 50

## 2020-12-30 MED ORDER — CARVEDILOL 6.25 MG PO TABS: 3.1250 mg | ORAL_TABLET | Freq: Two times a day (BID) | ORAL | Status: DC

## 2020-12-30 MED ORDER — ACETAMINOPHEN 325 MG PO TABS
650.0000 mg | ORAL_TABLET | Freq: Once | ORAL | Status: AC
  Administered 2020-12-30: 650 mg via ORAL
  Filled 2020-12-30: qty 2

## 2020-12-30 MED ORDER — CITALOPRAM HYDROBROMIDE 20 MG PO TABS: 40.0000 mg | ORAL_TABLET | Freq: Every day | ORAL | Status: DC

## 2020-12-30 MED ORDER — IRBESARTAN 150 MG PO TABS: 150.0000 mg | ORAL_TABLET | Freq: Every day | ORAL | Status: DC

## 2020-12-30 MED ORDER — TRAZODONE HCL 50 MG PO TABS
25.0000 mg | ORAL_TABLET | Freq: Every evening | ORAL | Status: DC | PRN
  Administered 2020-12-31: 25 mg via ORAL
  Filled 2020-12-30: qty 1

## 2020-12-30 MED ORDER — FAMOTIDINE 20 MG PO TABS
40.0000 mg | ORAL_TABLET | Freq: Every day | ORAL | Status: DC
  Administered 2020-12-30 – 2021-01-01 (×3): 40 mg via ORAL
  Filled 2020-12-30 (×3): qty 2

## 2020-12-30 MED ORDER — AMIODARONE HCL 200 MG PO TABS
200.0000 mg | ORAL_TABLET | Freq: Every day | ORAL | Status: DC
  Administered 2020-12-31: 200 mg via ORAL
  Filled 2020-12-30 (×2): qty 1

## 2020-12-30 MED ORDER — GABAPENTIN 600 MG PO TABS
600.0000 mg | ORAL_TABLET | Freq: Two times a day (BID) | ORAL | Status: DC
  Administered 2020-12-30 – 2021-01-02 (×6): 600 mg via ORAL
  Filled 2020-12-30 (×6): qty 1

## 2020-12-30 MED ORDER — TIOTROPIUM BROMIDE MONOHYDRATE 18 MCG IN CAPS
18.0000 ug | ORAL_CAPSULE | Freq: Every day | RESPIRATORY_TRACT | Status: DC
  Administered 2020-12-31 – 2021-01-02 (×3): 18 ug via RESPIRATORY_TRACT
  Filled 2020-12-30: qty 5

## 2020-12-30 MED ORDER — OXYCODONE HCL 5 MG PO TABS: 5.0000 mg | ORAL_TABLET | Freq: Four times a day (QID) | ORAL | Status: DC | PRN

## 2020-12-30 MED ORDER — MELATONIN 5 MG PO TABS: 10.0000 mg | ORAL_TABLET | Freq: Every evening | ORAL | Status: DC | PRN

## 2020-12-30 MED ORDER — HYDROXYZINE HCL 25 MG PO TABS: 50.0000 mg | ORAL_TABLET | Freq: Four times a day (QID) | ORAL | Status: DC | PRN

## 2020-12-30 MED ORDER — FERROUS SULFATE 325 (65 FE) MG PO TABS
325.0000 mg | ORAL_TABLET | Freq: Every day | ORAL | Status: DC
  Administered 2020-12-31 – 2021-01-02 (×3): 325 mg via ORAL
  Filled 2020-12-30 (×3): qty 1

## 2020-12-30 MED ORDER — ACETAMINOPHEN 325 MG PO TABS: 650.0000 mg | ORAL_TABLET | Freq: Four times a day (QID) | ORAL | Status: DC | PRN

## 2020-12-30 MED ORDER — LEVOTHYROXINE SODIUM 50 MCG PO TABS
75.0000 ug | ORAL_TABLET | Freq: Every day | ORAL | Status: DC
  Administered 2020-12-31 – 2021-01-02 (×3): 75 ug via ORAL
  Filled 2020-12-30 (×3): qty 2

## 2020-12-30 MED ORDER — FENOFIBRATE 160 MG PO TABS
160.0000 mg | ORAL_TABLET | Freq: Every day | ORAL | Status: DC
  Administered 2020-12-31 – 2021-01-02 (×3): 160 mg via ORAL
  Filled 2020-12-30 (×4): qty 1

## 2020-12-30 MED ORDER — DOCUSATE SODIUM 100 MG PO CAPS: 100.0000 mg | ORAL_CAPSULE | Freq: Every evening | ORAL | Status: DC | PRN

## 2020-12-30 MED ORDER — ONDANSETRON HCL 4 MG PO TABS: 4.0000 mg | ORAL_TABLET | Freq: Four times a day (QID) | ORAL | Status: DC | PRN

## 2020-12-30 MED ORDER — PANTOPRAZOLE SODIUM 40 MG PO TBEC
40.0000 mg | DELAYED_RELEASE_TABLET | Freq: Two times a day (BID) | ORAL | Status: DC
  Administered 2020-12-30 – 2021-01-02 (×6): 40 mg via ORAL
  Filled 2020-12-30 (×6): qty 1

## 2020-12-30 MED ORDER — ATORVASTATIN CALCIUM 20 MG PO TABS
80.0000 mg | ORAL_TABLET | Freq: Every day | ORAL | Status: DC
  Administered 2020-12-30 – 2021-01-01 (×3): 80 mg via ORAL
  Filled 2020-12-30 (×3): qty 4

## 2020-12-30 MED ORDER — ENOXAPARIN SODIUM 40 MG/0.4ML IJ SOSY: 40.0000 mg | PREFILLED_SYRINGE | INTRAMUSCULAR | Status: DC

## 2020-12-30 MED ORDER — ALBUTEROL SULFATE (2.5 MG/3ML) 0.083% IN NEBU: 3.0000 mL | INHALATION_SOLUTION | RESPIRATORY_TRACT | Status: DC | PRN

## 2020-12-30 MED ORDER — LIDOCAINE 5 % EX PTCH
3.0000 | MEDICATED_PATCH | CUTANEOUS | Status: DC
  Administered 2020-12-31 – 2021-01-02 (×3): 3 via TRANSDERMAL
  Filled 2020-12-30 (×3): qty 3

## 2020-12-30 MED ORDER — FUROSEMIDE 10 MG/ML IJ SOLN
20.0000 mg | Freq: Two times a day (BID) | INTRAMUSCULAR | Status: DC
  Administered 2020-12-31: 20 mg via INTRAVENOUS
  Filled 2020-12-30: qty 4

## 2020-12-30 MED ORDER — ACETAMINOPHEN 650 MG RE SUPP: 650.0000 mg | Freq: Four times a day (QID) | RECTAL | Status: DC | PRN

## 2020-12-30 MED ORDER — IPRATROPIUM-ALBUTEROL 0.5-2.5 (3) MG/3ML IN SOLN
3.0000 mL | Freq: Four times a day (QID) | RESPIRATORY_TRACT | Status: DC
  Administered 2020-12-30 – 2021-01-02 (×10): 3 mL via RESPIRATORY_TRACT
  Filled 2020-12-30 (×10): qty 3

## 2020-12-30 MED ORDER — OXYCODONE-ACETAMINOPHEN 5-325 MG PO TABS
1.0000 | ORAL_TABLET | Freq: Four times a day (QID) | ORAL | Status: DC | PRN
Start: 2020-12-30 — End: 2020-12-31

## 2020-12-30 MED ORDER — APIXABAN 5 MG PO TABS
5.0000 mg | ORAL_TABLET | Freq: Two times a day (BID) | ORAL | Status: DC
  Administered 2020-12-30 – 2021-01-02 (×6): 5 mg via ORAL
  Filled 2020-12-30 (×6): qty 1

## 2020-12-30 MED ORDER — MAGNESIUM HYDROXIDE 400 MG/5ML PO SUSP: 30.0000 mL | Freq: Every day | ORAL | Status: DC | PRN

## 2020-12-30 NOTE — ED Triage Notes (Signed)
Patient reports going for her colonoscopy today and didn't get procedure done due to hypotension and fatigue. Nurse sent patient to ER. Patient takes 325/10 percocet for chronic back pain. HX afib

## 2020-12-30 NOTE — ED Provider Notes (Signed)
[facility] Emergency Department Provider Note   ____________________________________________   Event Date/Time   First MD Initiated Contact with Patient 12/30/20 1615     (approximate)  I have reviewed the triage vital signs and the nursing notes.   HISTORY  Chief Complaint Hypotension    HPI Kristin [name] is a 70 y.o. female who presents for generalized weakness and hypotension  LOCATION: General DURATION: 12 hours prior to arrival TIMING: Began this morning and has been slightly improved since onset SEVERITY: Severe QUALITY: Weakness CONTEXT: Patient states that she has been feeling lightheaded and generally weak since this morning when she took her medications MODIFYING FACTORS: Worse with getting up from a seated position and improved when lying flat ASSOCIATED SYMPTOMS: Hypotension, headache   Per medical record review, patient has significant history of opiate prescriptions          Past Medical History:  Diagnosis Date   Anxiety    Asthma    CHF (congestive heart failure) ([place])    2018   COPD (chronic obstructive pulmonary disease) ([place])    Diabetes mellitus without complication ([place])    Dyspnea    GERD (gastroesophageal reflux disease)    History of hiatal hernia    History of orthopnea    History of radiation therapy 07/29/20-08/08/20   Right Lung, SBRT Dr. Gery Pray   Hypothyroidism    Neuropathy    Oxygen deficiency    3L/HS   Pain    BACK/DDD   Peripheral vascular disease (HCC)    RLS (restless legs syndrome)    Sleep apnea    Wheezing     Patient Active Problem List   Diagnosis Date Noted   Acute hypoxemic respiratory failure ([place]) 12/23/2020   Acute exacerbation of chronic obstructive pulmonary disease (COPD) ([place]) 12/22/2020   Aspiration into airway    Paroxysmal atrial fibrillation (HCC)    CAD (coronary artery disease) 09/26/2020   Hyperkalemia 09/26/2020   CHF exacerbation ([place]) 09/26/2020   Acute  on chronic respiratory failure with hypoxia and hypercapnia ([place]) 09/25/2020   Bradycardia 09/25/2020   Lung cancer ([place]) 05/26/2020   Atrial flutter with rapid ventricular response ([place]) 05/26/2020   Demand ischemia ([place])    Acute on chronic heart failure with preserved ejection fraction (HFpEF) ([place])    Aortic stenosis-moderate to severe 03/2020    Obesity, Class III, BMI 40-49.9 (morbid obesity) ([place]) 03/29/2020   Chronic respiratory failure with hypoxia ([place]) 03/29/2020   OSA (obstructive sleep apnea) 03/29/2020   Essential hypertension 03/29/2020   Hypothyroidism 03/29/2020   Chest pain 03/29/2020   Elevated troponin 03/29/2020   Chronic diastolic (congestive) heart failure ([place]) 07/12/2018   Type 2 diabetes mellitus without complication ([place]) 61/95/0932   COPD exacerbation ([place]) 07/12/2018   Tobacco use 07/12/2018   Hypotension 07/12/2018   Respiratory failure ([place]) 07/04/2018    Past Surgical History:  Procedure Laterality Date   BREAST SURGERY     CATARACT EXTRACTION W/PHACO Right 03/02/2018   Procedure: CATARACT EXTRACTION PHACO AND INTRAOCULAR LENS PLACEMENT ([place]);  Surgeon: Marchia Meiers, MD;  Location: [facility] OR[facility];  Service: Ophthalmology;  Laterality: Right;  [place] 01:15 CDE 16.46 Fluid pack lot # [id number]   CATARACT EXTRACTION W/PHACO Left 04/27/2018   Procedure: CATARACT EXTRACTION PHACO AND INTRAOCULAR LENS PLACEMENT ([place])- LEFT DIABETIC;  Surgeon: Marchia Meiers, MD;  Location: [facility] ORS;  Service: Ophthalmology;  Laterality: Left;  Lot # [id number] [place]: 00:46.3 CDE: 8.07    CYST EXCISION  FOREHEAD   FOOT SURGERY     CYST   RIGHT/LEFT HEART CATH AND CORONARY ANGIOGRAPHY N/A 03/31/2020   Procedure: RIGHT/LEFT HEART CATH AND CORONARY ANGIOGRAPHY;  Surgeon: [place] Hampshire, MD;  Location: [facility];  Service: Cardiovascular;  Laterality: N/A;   TUBAL LIGATION      Prior to Admission medications   Medication Sig Start Date End Date Taking? Authorizing  Provider  amiodarone (PACERONE) 200 MG tablet Take 1 tablet (200 mg total) by mouth daily. 11/03/20  Yes Gollan, Kathlene November, MD  apixaban (ELIQUIS) 5 MG TABS tablet Take 1 tablet (5 mg total) by mouth 2 (two) times daily. 11/03/20  Yes Minna Merritts, MD  atorvastatin (LIPITOR) 80 MG tablet Take 1 tablet (80 mg total) by mouth at bedtime. 11/03/20  Yes Minna Merritts, MD  carvedilol (COREG) 3.125 MG tablet Take 1 tablet (3.125 mg total) by mouth 2 (two) times daily. 11/03/20  Yes Minna Merritts, MD  citalopram (CELEXA) 40 MG tablet Take 40 mg by mouth daily. 06/14/20  Yes [provider]  docusate sodium (COLACE) 100 MG capsule Take 100 mg by mouth at bedtime as needed for mild constipation or moderate constipation.   Yes [provider]  famotidine (PEPCID) 40 MG tablet Take 40 mg by mouth at bedtime.   Yes [provider]  fenofibrate 160 MG tablet Take 160 mg by mouth daily.   Yes [provider]  ferrous sulfate 325 (65 FE) MG tablet Take 325 mg by mouth daily. 06/14/20  Yes [provider]  gabapentin (NEURONTIN) 600 MG tablet Take 600 mg by mouth 2 (two) times daily.  11/20/19  Yes [provider]  hydrOXYzine (VISTARIL) 50 MG capsule Take 50 mg by mouth every 6 (six) hours as needed for anxiety. 03/14/20  Yes [provider]  ipratropium-albuterol (DUONEB) 0.5-2.5 (3) MG/3ML SOLN Take 3 mLs by nebulization 4 (four) times daily.   Yes [provider]  irbesartan (AVAPRO) 300 MG tablet Take 0.5 tablets (150 mg total) by mouth daily. 06/30/20 06/25/21 Yes Loel Dubonnet, NP  levothyroxine (SYNTHROID) 75 MCG tablet Take 75 mcg by mouth daily before breakfast.   Yes [provider]  Melatonin 10 MG TABS Take 10 mg by mouth at bedtime as needed (sleep).   Yes [provider]  metFORMIN (GLUCOPHAGE) 500 MG tablet Take 500 mg by mouth 2 (two) times daily. 09/19/19  Yes [provider]   oxyCODONE-acetaminophen (PERCOCET) 10-325 MG tablet Take 1 tablet by mouth 4 (four) times daily as needed for pain.   Yes [provider]  pantoprazole (PROTONIX) 40 MG tablet Take 40 mg by mouth 2 (two) times daily. 11/11/20  Yes [provider]  predniSONE (DELTASONE) 10 MG tablet Take 4 tablets (40 mg total) by mouth daily for 3 days, THEN 2 tablets (20 mg total) daily for 3 days, THEN 1 tablet (10 mg total) daily for 3 days. 12/25/20 01/03/21 Yes Sharen Hones, MD  Tiotropium Bromide Monohydrate 2.5 MCG/ACT AERS Inhale 2 puffs into the lungs daily.    Yes [provider]  Torsemide 40 MG TABS Take 40 mg by mouth daily. Alternating with 20 mg by mouth every other day 12/25/20  Yes Sharen Hones, MD  Vitamin D, Ergocalciferol, (DRISDOL) 1.25 MG (50000 UNIT) CAPS capsule Take 50,000 Units by mouth once a week.   Yes [provider]  albuterol (VENTOLIN HFA) 108 (90 Base) MCG/ACT inhaler Inhale 2 puffs into the  lungs every 4 (four) hours as needed for wheezing or shortness of breath.     [provider]  lidocaine (LIDODERM) 5 % Place 3 patches onto the skin daily. (Remove after 12 hours and keep off for 12 hours)    [provider]  nicotine (NICODERM CQ - DOSED IN MG/24 HOURS) 21 mg/24hr patch Place 1 patch (21 mg total) onto the skin daily. Patient not taking: No sig reported 08/15/20   Marrianne Mood D, PA-C    Allergies Altace [ramipril]  Family History  Problem Relation Age of Onset   Heart disease Mother    Cancer Father     Social History Social History   Tobacco Use   Smoking status: Former    Packs/day: 1.00    Years: 50.00    Pack years: 50.00    Types: Cigarettes   Smokeless tobacco: Never  Vaping Use   Vaping Use: Never used  Substance Use Topics   Alcohol use: Not Currently   Drug use: Yes    Comment: prescribed oxy    Review of Systems Constitutional: No fever/chills. Eyes: No visual changes. ENT: No sore  throat. Cardiovascular: Denies chest pain. Respiratory: Denies shortness of breath. Gastrointestinal: No abdominal pain.  No nausea, no vomiting.  No diarrhea. Genitourinary: Negative for dysuria. Musculoskeletal: Negative for acute arthralgias Skin: Negative for rash. Neurological: Endorses generalized weakness.  Negative for headaches, numbness/paresthesias in any extremity Psychiatric: Negative for suicidal ideation/homicidal ideation   ____________________________________________   PHYSICAL EXAM:  VITAL SIGNS: ED Triage Vitals  Enc Vitals Group     BP 12/30/20 1607 (!) 104/43     Pulse Rate 12/30/20 1607 61     Resp 12/30/20 1607 20     Temp 12/30/20 1607 99.1 F (37.3 C)     Temp Source 12/30/20 1607 Oral     SpO2 12/30/20 1607 99 %     Weight 12/30/20 1608 229 lb (103.9 kg)     Height 12/30/20 1608 5\' 2"  (1.575 m)     Head Circumference --      Peak Flow --      Pain Score 12/30/20 1608 9     Pain Loc --      Pain Edu? --      Excl. in [place]? --    Constitutional: Alert and oriented. Well appearing elderly obese Caucasian female in no acute distress. Eyes: Conjunctivae are normal. PERRL. Head: Atraumatic. Nose: No congestion/rhinnorhea. Mouth/Throat: Mucous membranes are moist. Neck: No stridor Cardiovascular: Grossly normal heart sounds.  Good peripheral circulation. Respiratory: Normal respiratory effort.  No retractions. Gastrointestinal: Soft and nontender. No distention. Musculoskeletal: No obvious deformities Neurologic:  Normal speech and language. No gross focal neurologic deficits are appreciated. Skin:  Skin is warm and dry. No rash noted. Psychiatric: Mood and affect are normal. Speech and behavior are normal.  ____________________________________________   LABS (all labs ordered are listed, but only abnormal results are displayed)  Labs Reviewed  BASIC METABOLIC PANEL - Abnormal; Notable for the following components:      Result Value   Chloride  94 (*)    Glucose, Bld 140 (*)    BUN 67 (*)    Creatinine, Ser 2.47 (*)    Calcium 8.6 (*)    GFR, Estimated 21 (*)    All other components within normal limits  CBC - Abnormal; Notable for the following components:   WBC 11.2 (*)    RBC 3.67 (*)    Hemoglobin 10.6 (*)  HCT 33.9 (*)    All other components within normal limits  URINALYSIS, COMPLETE (UACMP) WITH MICROSCOPIC - Abnormal; Notable for the following components:   Color, Urine STRAW (*)    APPearance CLEAR (*)    All other components within normal limits  BRAIN NATRIURETIC PEPTIDE - Abnormal; Notable for the following components:   B Natriuretic Peptide 204.5 (*)    All other components within normal limits  RESP PANEL BY RT-PCR (FLU A&B, COVID) ARPGX2  LACTIC ACID, PLASMA  LACTIC ACID, PLASMA  TROPONIN I (HIGH SENSITIVITY)  TROPONIN I (HIGH SENSITIVITY)   ____________________________________________  EKG  ED ECG REPORT I, Naaman Plummer, the attending physician, personally viewed and interpreted this ECG.  Date: 12/30/2020 EKG Time: 1610 Rate: 42 Rhythm: Bradycardic sinus rhythm QRS Axis: normal Intervals: normal ST/T Wave abnormalities: normal Narrative Interpretation: Bradycardic sinus rhythm.  No evidence of acute ischemia  ____________________________________________  RADIOLOGY  ED MD interpretation: Chest x-ray shows cardiomegaly with vascular congestion and diffuse interstitial opacities concerning for pulmonary edema  Official radiology report(s): DG Chest Port 1 View  Result Date: 12/30/2020 CLINICAL DATA:  Hypotension fatigue EXAM: PORTABLE CHEST 1 VIEW COMPARISON:  12/22/2020, CT 04/08/2020, radiograph 05/29/2020, 09/25/2020 FINDINGS: Cardiomegaly with vascular congestion. Diffuse increased interstitial opacity some of which is felt chronic. No pleural effusion or pneumothorax. Aortic atherosclerosis IMPRESSION: 1. Cardiomegaly with vascular congestion. 2. Diffuse increased interstitial  opacity likely due to chronic interstitial changes. No acute superimposed confluent airspace disease Electronically Signed   By: Donavan Foil M.D.   On: 12/30/2020 18:25    ____________________________________________   PROCEDURES  Procedure(s) performed (including Critical Care):  Procedures   ____________________________________________   INITIAL IMPRESSION / ASSESSMENT AND PLAN / ED COURSE  As part of my medical decision making, I reviewed the following data within the electronic medical record, if available:  Nursing notes reviewed and incorporated, Labs reviewed, EKG interpreted, Old chart reviewed, Radiograph reviewed and Notes from prior ED visits reviewed and incorporated        This patient presents with generalized weakness and fatigue likely secondary to dehydration. Suspect acute kidney injury of prerenal origin. Doubt intrinsic renal dysfunction or obstructive nephropathy. Considered alternate etiologies of the patients symptoms including infectious processes, severe metabolic derangements or electrolyte abnormalities, ischemia/ACS, heart failure, and intracranial/central processes but think these are unlikely given the history and physical exam.  Plan: labs, 1L fluid resuscitation, pain/nausea control, reassessment  Dispo: Admit to medicine      ____________________________________________   FINAL CLINICAL IMPRESSION(S) / ED DIAGNOSES  Final diagnoses:  Acute renal failure superimposed on chronic kidney disease, unspecified CKD stage, unspecified acute renal failure type ([place])  Weakness  Opioid use     ED Discharge Orders     None        Note:  This document was prepared using Dragon voice recognition software and may include unintentional dictation errors.    Naaman Plummer, MD 12/30/20 2231

## 2020-12-30 NOTE — ED Triage Notes (Signed)
Pt in via MD appt with c/o hypotension and lethargic.

## 2020-12-30 NOTE — ED Notes (Addendum)
Per MD [place] verbal, pt on regular diet.  Gave pt sandwich tray.

## 2020-12-30 NOTE — H&P (Signed)
[place]   PATIENT NAME: Kristin [name]    MR#:  [id number]  DATE OF BIRTH:  [birth year]  DATE OF ADMISSION:  12/30/2020  PRIMARY CARE PHYSICIAN: [place]   Patient is coming from: Home  REQUESTING/REFERRING PHYSICIAN: Valora Piccolo, MD  CHIEF COMPLAINT:   Chief Complaint  Patient presents with   Hypotension    HISTORY OF PRESENT ILLNESS:  Jazminn A Phommachanh is a 70 y.o. female with medical history significant for asthma, CHF, COPD, type diabetes mellitus, GERD, hypothyroidism and peripheral neuropathy as well as peripheral vascular disease and sleep apnea, who presented to the ER with acute onset of generalized weakness with associated dizziness and lightheadedness and mild headache today.  She was seen in the gastroenterology clinic today when she was found to be hypotensive and referred to the ER.  She is not sure how much beta-blocker dose with Coreg she is taken today.  She denies any worsening orthopnea or dyspnea on exertion.  She admitted to lower extremity edema without worsening.  She admitted to chills without measured fever.  No dysuria, oliguria or hematuria or flank pain.  She denied any cough or wheezing or dyspnea or palpitations or chest pain.  She had 2 recent intermittent weight gain.  ED Course: When she came to the ER blood pressure was 144/88 with otherwise normal vital signs.  Labs revealed a BUN of 67 and creatinine of 2.47 compared to 50 and 1.69 on 8/11 and CBC showed leukocytosis of 11.2 with anemia close to baseline.  BNP was 204.5 and high-sensitivity troponin was 10 with lactic acid of 0.9.  Influenza antigens and COVID-19 PCR came back negative.  UA was unremarkable. EKG as reviewed by me : Showed marked sinus bradycardia with rate of 42 with marked sinus arrhythmia, low voltage QRS and Q waves anteroseptally. Imaging: Portable chest ray showed cardiomegaly with vascular congestion and diffuse increased interstitial opacity likely due to chronic  interstitial changes with no acute superimposed confluent airspace disease.  The patient was given 650 mg p.o. Tylenol and 1 g of calcium gluconate and 1 L bolus of IV normal saline.  She will be admitted to AN observation progressive unit bed for further evaluation and management. PAST MEDICAL HISTORY:   Past Medical History:  Diagnosis Date   Anxiety    Asthma    CHF (congestive heart failure) ([place])    2018   COPD (chronic obstructive pulmonary disease) (HCC)    Diabetes mellitus without complication (HCC)    Dyspnea    GERD (gastroesophageal reflux disease)    History of hiatal hernia    History of orthopnea    History of radiation therapy 07/29/20-08/08/20   Right Lung, SBRT Dr. Gery Pray   Hypothyroidism    Neuropathy    Oxygen deficiency    3L/HS   Pain    BACK/DDD   Peripheral vascular disease (HCC)    RLS (restless legs syndrome)    Sleep apnea    Wheezing     PAST SURGICAL HISTORY:   Past Surgical History:  Procedure Laterality Date   BREAST SURGERY     CATARACT EXTRACTION W/PHACO Right 03/02/2018   Procedure: CATARACT EXTRACTION PHACO AND INTRAOCULAR LENS PLACEMENT ([place]);  Surgeon: Marchia Meiers, MD;  Location: [facility];  Service: Ophthalmology;  Laterality: Right;  [place] 01:15 CDE 16.46 Fluid pack lot # [id number]   CATARACT EXTRACTION W/PHACO Left 04/27/2018   Procedure: CATARACT EXTRACTION PHACO AND INTRAOCULAR LENS PLACEMENT ([facility])- LEFT  DIABETIC;  Surgeon: Marchia Meiers, MD;  Location: [facility];  Service: Ophthalmology;  Laterality: Left;  Lot # [id number] [place]: 00:46.3 CDE: 8.07    CYST EXCISION     FOREHEAD   FOOT SURGERY     CYST   RIGHT/LEFT HEART CATH AND CORONARY ANGIOGRAPHY N/A 03/31/2020   Procedure: RIGHT/LEFT HEART CATH AND CORONARY ANGIOGRAPHY;  Surgeon: Wellington Hampshire, MD;  Location: [facility];  Service: Cardiovascular;  Laterality: N/A;   TUBAL LIGATION      SOCIAL HISTORY:   Social History   Tobacco Use   Smoking status:  Former    Packs/day: 1.00    Years: 50.00    Pack years: 50.00    Types: Cigarettes   Smokeless tobacco: Never  Substance Use Topics   Alcohol use: Not Currently    FAMILY HISTORY:   Family History  Problem Relation Age of Onset   Heart disease Mother    Cancer Father     DRUG ALLERGIES:   Allergies  Allergen Reactions   Altace [Ramipril] Swelling    REVIEW OF SYSTEMS:   ROS As per history of present illness. All pertinent systems were reviewed above. Constitutional, HEENT, cardiovascular, respiratory, GI, GU, musculoskeletal, neuro, psychiatric, endocrine, integumentary and hematologic systems were reviewed and are otherwise negative/unremarkable except for positive findings mentioned above in the HPI.   MEDICATIONS AT HOME:   Prior to Admission medications   Medication Sig Start Date End Date Taking? Authorizing Provider  amiodarone (PACERONE) 200 MG tablet Take 1 tablet (200 mg total) by mouth daily. 11/03/20  Yes Gollan, Kathlene November, MD  apixaban (ELIQUIS) 5 MG TABS tablet Take 1 tablet (5 mg total) by mouth 2 (two) times daily. 11/03/20  Yes Minna Merritts, MD  atorvastatin (LIPITOR) 80 MG tablet Take 1 tablet (80 mg total) by mouth at bedtime. 11/03/20  Yes Minna Merritts, MD  carvedilol (COREG) 3.125 MG tablet Take 1 tablet (3.125 mg total) by mouth 2 (two) times daily. 11/03/20  Yes Minna Merritts, MD  citalopram (CELEXA) 40 MG tablet Take 40 mg by mouth daily. 06/14/20  Yes [provider]  docusate sodium (COLACE) 100 MG capsule Take 100 mg by mouth at bedtime as needed for mild constipation or moderate constipation.   Yes [provider]  famotidine (PEPCID) 40 MG tablet Take 40 mg by mouth at bedtime.   Yes [provider]  fenofibrate 160 MG tablet Take 160 mg by mouth daily.   Yes [provider]  ferrous sulfate 325 (65 FE) MG tablet Take 325 mg by mouth daily. 06/14/20  Yes [provider]  gabapentin  (NEURONTIN) 600 MG tablet Take 600 mg by mouth 2 (two) times daily.  11/20/19  Yes [provider]  hydrOXYzine (VISTARIL) 50 MG capsule Take 50 mg by mouth every 6 (six) hours as needed for anxiety. 03/14/20  Yes [provider]  ipratropium-albuterol (DUONEB) 0.5-2.5 (3) MG/3ML SOLN Take 3 mLs by nebulization 4 (four) times daily.   Yes [provider]  irbesartan (AVAPRO) 300 MG tablet Take 0.5 tablets (150 mg total) by mouth daily. 06/30/20 06/25/21 Yes Loel Dubonnet, NP  levothyroxine (SYNTHROID) 75 MCG tablet Take 75 mcg by mouth daily before breakfast.   Yes [provider]  Melatonin 10 MG TABS Take 10 mg by mouth at bedtime as needed (sleep).   Yes [provider]  metFORMIN (GLUCOPHAGE) 500 MG tablet Take 500 mg by mouth 2 (two) times  daily. 09/19/19  Yes [provider]  oxyCODONE-acetaminophen (PERCOCET) 10-325 MG tablet Take 1 tablet by mouth 4 (four) times daily as needed for pain.   Yes [provider]  pantoprazole (PROTONIX) 40 MG tablet Take 40 mg by mouth 2 (two) times daily. 11/11/20  Yes [provider]  predniSONE (DELTASONE) 10 MG tablet Take 4 tablets (40 mg total) by mouth daily for 3 days, THEN 2 tablets (20 mg total) daily for 3 days, THEN 1 tablet (10 mg total) daily for 3 days. 12/25/20 01/03/21 Yes Sharen Hones, MD  Tiotropium Bromide Monohydrate 2.5 MCG/ACT AERS Inhale 2 puffs into the lungs daily.    Yes [provider]  Torsemide 40 MG TABS Take 40 mg by mouth daily. Alternating with 20 mg by mouth every other day 12/25/20  Yes Sharen Hones, MD  Vitamin D, Ergocalciferol, (DRISDOL) 1.25 MG (50000 UNIT) CAPS capsule Take 50,000 Units by mouth once a week.   Yes [provider]  albuterol (VENTOLIN HFA) 108 (90 Base) MCG/ACT inhaler Inhale 2 puffs into the lungs every 4 (four) hours as needed for wheezing or shortness of breath.     [provider]  lidocaine (LIDODERM) 5 % Place 3  patches onto the skin daily. (Remove after 12 hours and keep off for 12 hours)    [provider]  nicotine (NICODERM CQ - DOSED IN MG/24 HOURS) 21 mg/24hr patch Place 1 patch (21 mg total) onto the skin daily. Patient not taking: No sig reported 08/15/20   Marrianne Mood D, PA-C      VITAL SIGNS:  Blood pressure (!) 142/52, pulse 63, temperature 99.1 F (37.3 C), temperature source Oral, resp. rate 20, height 5\' 2"  (1.575 m), weight 103.9 kg, SpO2 100 %.  PHYSICAL EXAMINATION:  Physical Exam  GENERAL:  70 y.o.-year-old   Caucasian female patient lying in the bed with no acute distress.  EYES: Pupils equal, round, reactive to light and accommodation. No scleral icterus. Extraocular muscles intact.  HEENT: Head atraumatic, normocephalic. Oropharynx and nasopharynx clear.  NECK:  Supple, no jugular venous distention. No thyroid enlargement, no tenderness.  LUNGS: Normal breath sounds bilaterally, no wheezing, rales,rhonchi or crepitation. No use of accessory muscles of respiration.  CARDIOVASCULAR: Regular rate and rhythm, S1, S2 normal. No murmurs, rubs, or gallops.  ABDOMEN: Soft, nondistended, nontender. Bowel sounds present. No organomegaly or mass.  EXTREMITIES: No pedal edema, cyanosis, or clubbing.  NEUROLOGIC: Cranial nerves II through XII are intact. Muscle strength 5/5 in all extremities. Sensation intact. Gait not checked.  PSYCHIATRIC: The patient is alert and oriented x 3.  Normal affect and good eye contact. SKIN: No obvious rash, lesion, or ulcer.   LABORATORY PANEL:   CBC Recent Labs  Lab 12/30/20 1634  WBC 11.2*  HGB 10.6*  HCT 33.9*  PLT 248   ------------------------------------------------------------------------------------------------------------------  Chemistries  Recent Labs  Lab 12/25/20 0443 12/30/20 1634  NA 141 137  K 4.1 4.8  CL 91* 94*  CO2 41* 31  GLUCOSE 111* 140*  BUN 50* 67*  CREATININE 1.69* 2.47*  CALCIUM 9.0 8.6*  MG 2.3   --    ------------------------------------------------------------------------------------------------------------------  Cardiac Enzymes No results for input(s): TROPONINI in the last 168 hours. ------------------------------------------------------------------------------------------------------------------  RADIOLOGY:  DG Chest Port 1 View  Result Date: 12/30/2020 CLINICAL DATA:  Hypotension fatigue EXAM: PORTABLE CHEST 1 VIEW COMPARISON:  12/22/2020, CT 04/08/2020, radiograph 05/29/2020, 09/25/2020 FINDINGS: Cardiomegaly with vascular congestion. Diffuse increased interstitial opacity some of which is felt  chronic. No pleural effusion or pneumothorax. Aortic atherosclerosis IMPRESSION: 1. Cardiomegaly with vascular congestion. 2. Diffuse increased interstitial opacity likely due to chronic interstitial changes. No acute superimposed confluent airspace disease Electronically Signed   By: Donavan Foil M.D.   On: 12/30/2020 18:25      IMPRESSION AND PLAN:  Active Problems:   Hypotension  1.  Generalized weakness and dizziness, likely secondary to hypotension and symptomatic bradycardia.  This could be related to beta-blocker overdose. - The patient will be admitted to an observation progressive unit bed. - She was given 1 ampoule of calcium gluconate. - We will hold off beta-blockers for now. - We will monitor her blood pressure and heart rate. - Cardiology consult to be obtained. - I notified Dr. Rayann Heman about the patient.  2.  Acute on chronic diastolic CHF with EF of 55 to 60% on 10/24/2020 with mild left atrial and right atrial dilatation.  The patient likely has prerenal subsequent acute kidney injury superimposed on stage IIIb chronic kidney disease. - We will gently diurese with small dose IV Lasix with stabilized blood pressure. - Cardiology consult to be obtained as mentioned above. - We will follow renal functions with gentle diuresis.  3.  Paroxysmal atrial flutter. - We  will continue Eliquis and amiodarone.  4.  Type 2 diabetes mellitus with peripheral neuropathy. - We will place the patient on supplement coverage with NovoLog and hold off metformin.  5.  COPD without exacerbation. - She will be placed on her Spiriva and DuoNebs.  6.  GERD. - We will continue PPI therapy.   DVT prophylaxis: Eliquis.   Code Status: full code. Family Communication:  The plan of care was discussed in details with the patient (and her daughter who was with her in the room). I answered all questions. The patient agreed to proceed with the above mentioned plan. Further management will depend upon hospital course. Disposition Plan: Back to previous home environment Consults called: Cardiology. All the records are reviewed and case discussed with ED provider.  Status is: Observation  The patient remains OBS appropriate and will d/c before 2 midnights.  Dispo: The patient is from: Home              Anticipated d/c is to: Home              Patient currently is not medically stable to d/c.   Difficult to place patient No   TOTAL TIME TAKING CARE OF THIS PATIENT: 55 minutes.    Christel Mormon M.D on 12/30/2020 at 11:00 PM  [facility]   From 7 PM-7 AM, contact night-coverage [email or web address]  CC: Primary care physician; [place]

## 2020-12-31 ENCOUNTER — Encounter: Payer: Self-pay | Admitting: Obstetrics and Gynecology

## 2020-12-31 DIAGNOSIS — I4892 Unspecified atrial flutter: Secondary | ICD-10-CM | POA: Diagnosis present

## 2020-12-31 DIAGNOSIS — I358 Other nonrheumatic aortic valve disorders: Secondary | ICD-10-CM | POA: Diagnosis present

## 2020-12-31 DIAGNOSIS — E1142 Type 2 diabetes mellitus with diabetic polyneuropathy: Secondary | ICD-10-CM | POA: Diagnosis present

## 2020-12-31 DIAGNOSIS — R531 Weakness: Secondary | ICD-10-CM

## 2020-12-31 DIAGNOSIS — E860 Dehydration: Secondary | ICD-10-CM | POA: Diagnosis present

## 2020-12-31 DIAGNOSIS — I480 Paroxysmal atrial fibrillation: Secondary | ICD-10-CM

## 2020-12-31 DIAGNOSIS — R001 Bradycardia, unspecified: Secondary | ICD-10-CM

## 2020-12-31 DIAGNOSIS — I5033 Acute on chronic diastolic (congestive) heart failure: Secondary | ICD-10-CM | POA: Diagnosis present

## 2020-12-31 DIAGNOSIS — N179 Acute kidney failure, unspecified: Secondary | ICD-10-CM | POA: Diagnosis present

## 2020-12-31 DIAGNOSIS — R42 Dizziness and giddiness: Secondary | ICD-10-CM | POA: Diagnosis not present

## 2020-12-31 DIAGNOSIS — E039 Hypothyroidism, unspecified: Secondary | ICD-10-CM | POA: Diagnosis present

## 2020-12-31 DIAGNOSIS — I350 Nonrheumatic aortic (valve) stenosis: Secondary | ICD-10-CM | POA: Diagnosis present

## 2020-12-31 DIAGNOSIS — C3490 Malignant neoplasm of unspecified part of unspecified bronchus or lung: Secondary | ICD-10-CM | POA: Diagnosis present

## 2020-12-31 DIAGNOSIS — Z20822 Contact with and (suspected) exposure to covid-19: Secondary | ICD-10-CM | POA: Diagnosis present

## 2020-12-31 DIAGNOSIS — I272 Pulmonary hypertension, unspecified: Secondary | ICD-10-CM | POA: Diagnosis present

## 2020-12-31 DIAGNOSIS — J441 Chronic obstructive pulmonary disease with (acute) exacerbation: Secondary | ICD-10-CM | POA: Diagnosis present

## 2020-12-31 DIAGNOSIS — I700 Atherosclerosis of aorta: Secondary | ICD-10-CM | POA: Diagnosis present

## 2020-12-31 DIAGNOSIS — G2581 Restless legs syndrome: Secondary | ICD-10-CM | POA: Diagnosis present

## 2020-12-31 DIAGNOSIS — I498 Other specified cardiac arrhythmias: Secondary | ICD-10-CM | POA: Diagnosis not present

## 2020-12-31 DIAGNOSIS — E1122 Type 2 diabetes mellitus with diabetic chronic kidney disease: Secondary | ICD-10-CM | POA: Diagnosis present

## 2020-12-31 DIAGNOSIS — E1151 Type 2 diabetes mellitus with diabetic peripheral angiopathy without gangrene: Secondary | ICD-10-CM | POA: Diagnosis present

## 2020-12-31 DIAGNOSIS — I495 Sick sinus syndrome: Secondary | ICD-10-CM | POA: Diagnosis present

## 2020-12-31 DIAGNOSIS — I471 Supraventricular tachycardia: Secondary | ICD-10-CM | POA: Diagnosis present

## 2020-12-31 DIAGNOSIS — D631 Anemia in chronic kidney disease: Secondary | ICD-10-CM | POA: Diagnosis present

## 2020-12-31 DIAGNOSIS — N1832 Chronic kidney disease, stage 3b: Secondary | ICD-10-CM | POA: Diagnosis present

## 2020-12-31 DIAGNOSIS — I959 Hypotension, unspecified: Secondary | ICD-10-CM | POA: Diagnosis present

## 2020-12-31 DIAGNOSIS — F329 Major depressive disorder, single episode, unspecified: Secondary | ICD-10-CM | POA: Diagnosis present

## 2020-12-31 DIAGNOSIS — Z6841 Body Mass Index (BMI) 40.0 and over, adult: Secondary | ICD-10-CM | POA: Diagnosis not present

## 2020-12-31 LAB — CBC
HCT: 31.7 % — ABNORMAL LOW (ref 36.0–46.0)
Hemoglobin: 9.9 g/dL — ABNORMAL LOW (ref 12.0–15.0)
MCH: 29.6 pg (ref 26.0–34.0)
MCHC: 31.2 g/dL (ref 30.0–36.0)
MCV: 94.6 fL (ref 80.0–100.0)
Platelets: 226 10*3/uL (ref 150–400)
RBC: 3.35 MIL/uL — ABNORMAL LOW (ref 3.87–5.11)
RDW: 15.2 % (ref 11.5–15.5)
WBC: 9 10*3/uL (ref 4.0–10.5)
nRBC: 0 % (ref 0.0–0.2)

## 2020-12-31 LAB — BASIC METABOLIC PANEL
Anion gap: 12 (ref 5–15)
BUN: 56 mg/dL — ABNORMAL HIGH (ref 8–23)
CO2: 33 mmol/L — ABNORMAL HIGH (ref 22–32)
Calcium: 8.8 mg/dL — ABNORMAL LOW (ref 8.9–10.3)
Chloride: 96 mmol/L — ABNORMAL LOW (ref 98–111)
Creatinine, Ser: 2.22 mg/dL — ABNORMAL HIGH (ref 0.44–1.00)
GFR, Estimated: 24 mL/min — ABNORMAL LOW (ref >60)
Glucose, Bld: 100 mg/dL — ABNORMAL HIGH (ref 70–99)
Potassium: 4.4 mmol/L (ref 3.5–5.1)
Sodium: 141 mmol/L (ref 135–145)

## 2020-12-31 LAB — LACTIC ACID, PLASMA: Lactic Acid, Venous: 0.8 mmol/L (ref 0.5–1.9)

## 2020-12-31 MED ORDER — TORSEMIDE 40 MG PO TABS: 40.0000 mg | ORAL_TABLET | Freq: Every day | ORAL | Status: DC

## 2020-12-31 MED ORDER — CITALOPRAM HYDROBROMIDE 20 MG PO TABS
40.0000 mg | ORAL_TABLET | Freq: Every day | ORAL | Status: DC
  Administered 2020-12-31 – 2021-01-02 (×3): 40 mg via ORAL
  Filled 2020-12-31 (×3): qty 2

## 2020-12-31 MED ORDER — TORSEMIDE 20 MG PO TABS
40.0000 mg | ORAL_TABLET | ORAL | Status: DC
  Filled 2020-12-31: qty 2

## 2020-12-31 MED ORDER — CITALOPRAM HYDROBROMIDE 20 MG PO TABS: 20.0000 mg | ORAL_TABLET | Freq: Every day | ORAL | Status: DC

## 2020-12-31 MED ORDER — TORSEMIDE 20 MG PO TABS
20.0000 mg | ORAL_TABLET | ORAL | Status: DC
  Administered 2020-12-31: 20 mg via ORAL
  Filled 2020-12-31: qty 1

## 2020-12-31 NOTE — Consult Note (Signed)
   Heart failure Nurse Navigator Note  HFpEF 55 to 60%.  Moderate LVH.  Mildly elevated pulmonary artery pressures.  Normal right ventricular systolic function, mild biatrial enlargement.   She presented to the emergency after she had presented for a colonoscopy and was found to be hypotensive.  Comorbidities:  Asthma Ongoing tobacco abuse COPD with chronic use of O2 Obstructive sleep apnea with CPAP Hypertension Hyperlipidemia Type 2 diabetes Iron deficiency anemia Chronic anxiety/depression Paroxysmal atrial fibrillation on anticoagulation  Labs:  Sodium 141, potassium 4.4, chloride 96, CO2 33, BUN 56, creatinine 2.22, BNP 204.5, lactic acid 0.9 Blood pressure 110/54  Medications:  Torsemide 20 mg daily decreased to 40 mg tomorrow Amiodarone 200 mg daily Atorvastatin 80 mg daily Fenofibrate 160 mg daily Apixaban 5 mg twice daily   Met with patient in the ED, she is currently sitting on the edge of the bed.  She denies any increasing shortness of breath, dizziness or lightheadedness.  She states that she was surprised to learn to use arrived for her procedure that she was told she was running a fever and also had a low blood pressure as she states at that time she did not have any dizziness or lightheadedness.  She states that she since she had been discharged she been weighing herself daily 1 day would be 229 and then the next to 231 and then the next day would be back to 229.  States that she had a good appetite had been sticking with a low sodium diet.  She had no further questions and we will continue to follow along.  Pricilla Riffle RN [facility]

## 2020-12-31 NOTE — Progress Notes (Signed)
PROGRESS NOTE    Kristin [name]  ZOX:[id number] DOB: 16-Feb-1951 DOA: 12/30/2020 PCP: [place]  Outpatient Specialists: [facility]    Brief Narrative:   Kristin [name] is a 70 y.o. female with medical history significant for asthma, CHF, COPD, type diabetes mellitus, GERD, hypothyroidism and peripheral neuropathy as well as peripheral vascular disease and sleep apnea, who presented to the ER with acute onset of generalized weakness with associated dizziness and lightheadedness and mild headache today.  She was seen in the gastroenterology clinic today when she was found to be hypotensive and referred to the ER.  She is not sure how much beta-blocker dose with Coreg she is taken today.  She denies any worsening orthopnea or dyspnea on exertion.  She admitted to lower extremity edema without worsening.  She admitted to chills without measured fever.  No dysuria, oliguria or hematuria or flank pain.  She denied any cough or wheezing or dyspnea or palpitations or chest pain.  She had 2 recent intermittent weight gain.   Assessment & Plan:   Active Problems:   Hypotension  # Generalized weakness Presented from  GI clinic for acute onset weakness and hypotension. Here initially hypotensive, HR in 50s. Hypotension resolved, patient feels as though she is back to baseline. She does not think she mis-took meds. Etiology likely multifactorial 2/2 multiple severe comorbidities and poor functional baseline. BB has been held - cardiology to see  # HFpEF # Severe aortic stenosis Appears compensated. - cardiology to see - holding home torsemide given aki, does not appear fluid overloaded  # Paroxysmal a-flutter/fib Here rate controlled - BB held as above - cont eliquis, amiodarone  # T2DM - SSI, hold metformin, consider hold at d/c given gfr  # CKD3 with superimposed AKI Cr 2.47 on presentation, 2.22 today, baseline 1.7. Does not appear fluid overloaded - hold home torsemide,  irbesartan  # COPD # Chronic hypoxic respiratory failure Without exacerbation - cont home O2 3 L, home meds  # Hypothyroid - home synthroid  # MDD - home celexa  # Neuropathy - home gabapentin  # Lung cancer Followed by oncology in [place]. Is s/p radiation - close outpt f/u  DVT prophylaxis: eliquis Code Status: full Family Communication: daughter updated @ bedside 8/17  Level of care: Progressive Cardiac Status is: Observation  The patient will require care spanning > 2 midnights and should be moved to inpatient because: Inpatient level of care appropriate due to severity of illness  Dispo: The patient is from: Home              Anticipated d/c is to: Home with home health, which was ordered at recent hospitalization              Patient currently is not medically stable to d/c.   Difficult to place patient No        Consultants:  cardiology  Procedures: none  Antimicrobials:  none    Subjective: This morning feels back to baseline. Breathing is at baseline. No chest pain. Doesn't feel weak. Has appetite  Objective: Vitals:   12/30/20 2300 12/31/20 0130 12/31/20 0530 12/31/20 0720  BP: (!) 118/58 (!) 133/56 (!) 123/59 (!) 134/54  Pulse: (!) 59 (!) 56 (!) 55 (!) 58  Resp: 17 14 14 11   Temp:      TempSrc:      SpO2: 97% 99% 99% 100%  Weight:      Height:  Intake/Output Summary (Last 24 hours) at 12/31/2020 0240 Last data filed at 12/30/2020 2109 Gross per 24 hour  Intake --  Output 700 ml  Net -700 ml   Filed Weights   12/30/20 1608  Weight: 103.9 kg    Examination:  General exam: Appears calm and comfortable  Respiratory system: scattered exp wheeze faint Cardiovascular system: S1 & S2 heard, RR, harsh systolic murmur Gastrointestinal system: Abdomen is obese, soft and nontender. No organomegaly or masses felt. Normal bowel sounds heard. Central nervous system: Alert and oriented. No focal neurological deficits. Extremities:  Symmetric 5 x 5 power. Skin: No rashes, lesions or ulcers Psychiatry: Judgement and insight appear normal. Mood & affect appropriate.     Data Reviewed: I have personally reviewed following labs and imaging studies  CBC: Recent Labs  Lab 12/25/20 0443 12/30/20 1634 12/31/20 0657  WBC 9.9 11.2* 9.0  NEUTROABS 7.5  --   --   HGB 10.0* 10.6* 9.9*  HCT 31.9* 33.9* 31.7*  MCV 91.9 92.4 94.6  PLT 237 248 973   Basic Metabolic Panel: Recent Labs  Lab 12/25/20 0443 12/30/20 1634 12/31/20 0657  NA 141 137 141  K 4.1 4.8 4.4  CL 91* 94* 96*  CO2 41* 31 33*  GLUCOSE 111* 140* 100*  BUN 50* 67* 56*  CREATININE 1.69* 2.47* 2.22*  CALCIUM 9.0 8.6* 8.8*  MG 2.3  --   --    GFR: Estimated Creatinine Clearance: 27.4 mL/min (A) (by C-G formula based on SCr of 2.22 mg/dL (H)). Liver Function Tests: No results for input(s): AST, ALT, ALKPHOS, BILITOT, PROT, ALBUMIN in the last 168 hours. No results for input(s): LIPASE, AMYLASE in the last 168 hours. No results for input(s): AMMONIA in the last 168 hours. Coagulation Profile: No results for input(s): INR, PROTIME in the last 168 hours. Cardiac Enzymes: No results for input(s): CKTOTAL, CKMB, CKMBINDEX, TROPONINI in the last 168 hours. BNP (last 3 results) No results for input(s): PROBNP in the last 8760 hours. HbA1C: No results for input(s): HGBA1C in the last 72 hours. CBG: Recent Labs  Lab 12/24/20 1143 12/24/20 1543 12/24/20 2155 12/25/20 0804 12/25/20 1158  GLUCAP 108* 125* 103* 93 149*   Lipid Profile: No results for input(s): CHOL, HDL, LDLCALC, TRIG, CHOLHDL, LDLDIRECT in the last 72 hours. Thyroid Function Tests: No results for input(s): TSH, T4TOTAL, FREET4, T3FREE, THYROIDAB in the last 72 hours. Anemia Panel: No results for input(s): VITAMINB12, FOLATE, FERRITIN, TIBC, IRON, RETICCTPCT in the last 72 hours. Urine analysis:    Component Value Date/Time   COLORURINE STRAW (A) 12/30/2020 2017   APPEARANCEUR  CLEAR (A) 12/30/2020 2017   LABSPEC 1.009 12/30/2020 2017   PHURINE 6.0 12/30/2020 2017   GLUCOSEU NEGATIVE 12/30/2020 2017   HGBUR NEGATIVE 12/30/2020 2017   BILIRUBINUR NEGATIVE 12/30/2020 2017   KETONESUR NEGATIVE 12/30/2020 2017   PROTEINUR NEGATIVE 12/30/2020 2017   NITRITE NEGATIVE 12/30/2020 2017   LEUKOCYTESUR NEGATIVE 12/30/2020 2017   Sepsis Labs: @LABRCNTIP (procalcitonin:4,lacticidven:4)  ) Recent Results (from the past 240 hour(s))  Resp Panel by RT-PCR (Flu A&B, Covid) Nasopharyngeal Swab     Status: None   Collection Time: 12/22/20 10:16 AM   Specimen: Nasopharyngeal Swab; Nasopharyngeal(NP) swabs in vial transport medium  Result Value Ref Range Status   SARS Coronavirus 2 by RT PCR NEGATIVE NEGATIVE Final    Comment: (NOTE) SARS-CoV-2 target nucleic acids are NOT DETECTED.  The SARS-CoV-2 RNA is generally detectable in upper respiratory specimens during the acute phase of  infection. The lowest concentration of SARS-CoV-2 viral copies this assay can detect is 138 copies/mL. A negative result does not preclude SARS-Cov-2 infection and should not be used as the sole basis for treatment or other patient management decisions. A negative result may occur with  improper specimen collection/handling, submission of specimen other than nasopharyngeal swab, presence of viral mutation(s) within the areas targeted by this assay, and inadequate number of viral copies(<138 copies/mL). A negative result must be combined with clinical observations, patient history, and epidemiological information. The expected result is Negative.  Fact Sheet for Patients:  EntrepreneurPulse.com.au  Fact Sheet for Healthcare Providers:  IncredibleEmployment.be  This test is no t yet approved or cleared by the [place] FDA and  has been authorized for detection and/or diagnosis of SARS-CoV-2 by FDA under an Emergency Use Authorization (EUA). This EUA  will remain  in effect (meaning this test can be used) for the duration of the COVID-19 declaration under Section 564(b)(1) of the Act, 21 U.S.C.section 360bbb-3(b)(1), unless the authorization is terminated  or revoked sooner.       Influenza A by PCR NEGATIVE NEGATIVE Final   Influenza B by PCR NEGATIVE NEGATIVE Final    Comment: (NOTE) The Xpert Xpress SARS-CoV-2/FLU/RSV plus assay is intended as an aid in the diagnosis of influenza from Nasopharyngeal swab specimens and should not be used as a sole basis for treatment. Nasal washings and aspirates are unacceptable for Xpert Xpress SARS-CoV-2/FLU/RSV testing.  Fact Sheet for Patients: EntrepreneurPulse.com.au  Fact Sheet for Healthcare Providers: IncredibleEmployment.be  This test is not yet approved or cleared by the [place] FDA and has been authorized for detection and/or diagnosis of SARS-CoV-2 by FDA under an Emergency Use Authorization (EUA). This EUA will remain in effect (meaning this test can be used) for the duration of the COVID-19 declaration under Section 564(b)(1) of the Act, 21 U.S.C. section 360bbb-3(b)(1), unless the authorization is terminated or revoked.  Performed at [facility], [address] [place], [place] [address]   Blood culture (routine x 2)     Status: None   Collection Time: 12/22/20 12:23 PM   Specimen: BLOOD  Result Value Ref Range Status   Specimen Description BLOOD BLOOD RIGHT FOREARM  Final   Special Requests   Final    BOTTLES DRAWN AEROBIC AND ANAEROBIC Blood Culture adequate volume   Culture   Final    NO GROWTH 5 DAYS Performed at [facility], [address], [place], [place] [address]    Report Status 12/27/2020 FINAL  Final  Blood culture (routine x 2)     Status: None   Collection Time: 12/22/20 12:23 PM   Specimen: BLOOD  Result Value Ref Range Status   Specimen Description BLOOD BLOOD LEFT FOREARM  Final    Special Requests   Final    BOTTLES DRAWN AEROBIC AND ANAEROBIC Blood Culture adequate volume   Culture   Final    NO GROWTH 5 DAYS Performed at [facility], [address] [place], [place] [address]    Report Status 12/27/2020 FINAL  Final  Resp Panel by RT-PCR (Flu A&B, Covid) Nasopharyngeal Swab     Status: None   Collection Time: 12/30/20  5:28 PM   Specimen: Nasopharyngeal Swab; Nasopharyngeal(NP) swabs in vial transport medium  Result Value Ref Range Status   SARS Coronavirus 2 by RT PCR NEGATIVE NEGATIVE Final    Comment: (NOTE) SARS-CoV-2 target nucleic acids are NOT DETECTED.  The SARS-CoV-2 RNA is generally  detectable in upper respiratory specimens during the acute phase of infection. The lowest concentration of SARS-CoV-2 viral copies this assay can detect is 138 copies/mL. A negative result does not preclude SARS-Cov-2 infection and should not be used as the sole basis for treatment or other patient management decisions. A negative result may occur with  improper specimen collection/handling, submission of specimen other than nasopharyngeal swab, presence of viral mutation(s) within the areas targeted by this assay, and inadequate number of viral copies(<138 copies/mL). A negative result must be combined with clinical observations, patient history, and epidemiological information. The expected result is Negative.  Fact Sheet for Patients:  EntrepreneurPulse.com.au  Fact Sheet for Healthcare Providers:  IncredibleEmployment.be  This test is no t yet approved or cleared by the [place] FDA and  has been authorized for detection and/or diagnosis of SARS-CoV-2 by FDA under an Emergency Use Authorization (EUA). This EUA will remain  in effect (meaning this test can be used) for the duration of the COVID-19 declaration under Section 564(b)(1) of the Act, 21 U.S.C.section 360bbb-3(b)(1), unless the authorization is  terminated  or revoked sooner.       Influenza A by PCR NEGATIVE NEGATIVE Final   Influenza B by PCR NEGATIVE NEGATIVE Final    Comment: (NOTE) The Xpert Xpress SARS-CoV-2/FLU/RSV plus assay is intended as an aid in the diagnosis of influenza from Nasopharyngeal swab specimens and should not be used as a sole basis for treatment. Nasal washings and aspirates are unacceptable for Xpert Xpress SARS-CoV-2/FLU/RSV testing.  Fact Sheet for Patients: EntrepreneurPulse.com.au  Fact Sheet for Healthcare Providers: IncredibleEmployment.be  This test is not yet approved or cleared by the [place] FDA and has been authorized for detection and/or diagnosis of SARS-CoV-2 by FDA under an Emergency Use Authorization (EUA). This EUA will remain in effect (meaning this test can be used) for the duration of the COVID-19 declaration under Section 564(b)(1) of the Act, 21 U.S.C. section 360bbb-3(b)(1), unless the authorization is terminated or revoked.  Performed at [facility], [address] [place], [place] [address]          Radiology Studies: DG Chest [place] 1 View  Result Date: 12/30/2020 CLINICAL DATA:  Hypotension fatigue EXAM: PORTABLE CHEST 1 VIEW COMPARISON:  12/22/2020, CT 04/08/2020, radiograph 05/29/2020, 09/25/2020 FINDINGS: Cardiomegaly with vascular congestion. Diffuse increased interstitial opacity some of which is felt chronic. No pleural effusion or pneumothorax. Aortic atherosclerosis IMPRESSION: 1. Cardiomegaly with vascular congestion. 2. Diffuse increased interstitial opacity likely due to chronic interstitial changes. No acute superimposed confluent airspace disease Electronically Signed   By: Donavan Foil M.D.   On: 12/30/2020 18:25        Scheduled Meds:  amiodarone  200 mg Oral Daily   apixaban  5 mg Oral BID   atorvastatin  80 mg Oral QHS   citalopram  20 mg Oral Daily   famotidine  40 mg Oral QHS    fenofibrate  160 mg Oral Daily   ferrous sulfate  325 mg Oral Q breakfast   furosemide  20 mg Intravenous BID   gabapentin  600 mg Oral BID   ipratropium-albuterol  3 mL Nebulization QID   levothyroxine  75 mcg Oral QAC breakfast   lidocaine  3 patch Transdermal Q24H   pantoprazole  40 mg Oral BID   tiotropium  18 mcg Inhalation Daily   [START ON 01/05/2021] Vitamin D (Ergocalciferol)  50,000 Units Oral Weekly   Continuous Infusions:   LOS: 0 days  Time spent: 40 min    Desma Maxim, MD [facility]   If 7PM-7AM, please contact night-coverage [email or web address] Password [facility] 12/31/2020, 8:12 AM

## 2020-12-31 NOTE — Consult Note (Addendum)
Cardiology Consultation:   Patient ID: Kristin [name] MRN: [id number]; DOB: 1951-04-21  Admit date: 12/30/2020 Date of Consult: 12/31/2020  PCP:  [facility]  Cardiologist:  Ida Rogue, MD  Advanced Practice Provider:  No care team member to display Electrophysiologist:  None [phone]    Patient Profile:   Kristin [name] is a 70 y.o. 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 Si Raider.  History of Present Illness:   Ms. Kristin [name] is a 70 y.o. female with PMH as above. She has no teeth and no dentures. She is retired from [employer] work. She lives with her daughter in [place]. She has a history of diastolic CHF after admission 07/04/2018 due to acute COPD. 03/2020, she was admitted for NSTEMI. Echo with nl EF, mild LVH, G1DD, mild LAE, and moderate to severe AS. R/LHC with moderate nonobstructive CAD -60% of pLAD. Cors were overall moderately calcified. Mildly elevated L sided filling pressures, moderate PHTN, moderately reduced CO. Severe AS noted with mean gradient 26mmHg and calcified valve 0.7cm2. Outpatient TAVR evaluation performed, as well as evaluation by CTS for replacement of her aortic valve. TAVR workup has been complicated by subsequent RLL SCC diagnosis. During oncology imaging, cardiology consulted with pt found to be in atrial flutter. She converted to a junctional rhythm with 4.1 second termination pauses. Amiodarone started. OP cardiac monitor showed SR and AFib. In the setting  of lung CA, [place] deferred. Lung bx performed, per CTS request, confirming a diagnosis of SCC of the right lower lobe. CTS/TAVR team recommendation was thus for delay of TAVR and workup/tx of SCC per oncology. Lung CA was subsequently treated with radiation tx. She was then started on Eliquis with amiodarone continued. Most recent Echo 10/2020 EF 50-60%, moderate LVH, AV calcified and thickened with mean gradient 107mmHg and pk gradient 59mmHG AVA 0.85cm2, SVI 37. This was consistent with moderate to severe AS.  Last seen by Dr. Angelena Form 7/28. She had recently broke her arm and was sedentary in a wheelchair and fatigued. She continued home O2 with stable DOE. She was not thought a good candidate for surgical AVR, given morbid obesity and COPD.  Once her AV disease progressed, she was noted to possibly be a good candidate for TAVR.  Plan was for repeat echo in 6 months. She continued on torsemide 20/40mg , alternating each day.   On 12/30/2020, she presented to the [facility] emergency department with general weakness, dizziness/lightheadedness, headache, and recent pt reported fever and chills.  Fever occurred 8/16 per pt. No sick contacts to her knowledge. She was seen by GI same day and hypotensive. She was sent to the ED. In the ED, Initial BP 104/43 with HR 61 bpm.  Weight 229 pounds.  Labs showed glucose 140, creatinine 2.47, BUN 67, WBC 11.2, hemoglobin 10.6, hematocrit 33.9, BNP 204.5.  EKG without acute ST/T changes.  Chest x-ray with vascular congestion and chronic interstitial changes.  It was thought her weakness and fatigue was likely secondary to dehydration with brief IVF  now dc'd. On exam today, she denies CP. She reports wheezing, mild increase in orthopnea, bloating, and very mild LEE x1 day. She reports cough. Despite wheezing, she states her breathing is stable on [place] O2. Prior to admission, she reports medication compliance with torsemide 40 mg and 20 mg.  She reports low salt intake and adequate intake  of fluids though under 2L.  Telemetry shows sinus rhythm with frequent ectopy.  Past Medical History:  Diagnosis Date   Anxiety    Asthma    CHF (congestive heart failure) ([place])    2018   COPD (chronic obstructive pulmonary disease) (HCC)    Diabetes mellitus without complication (HCC)    Dyspnea    GERD (gastroesophageal reflux disease)    History of hiatal hernia    History of orthopnea    History of radiation therapy 07/29/20-08/08/20   Right Lung, SBRT Dr. Gery Pray   Hypothyroidism    Neuropathy    Oxygen deficiency    3L/HS   Pain    BACK/DDD   Peripheral vascular disease (HCC)    RLS (restless legs syndrome)    Sleep apnea    Wheezing     Past Surgical History:  Procedure Laterality Date   BREAST SURGERY     CATARACT EXTRACTION W/PHACO Right 03/02/2018   Procedure: CATARACT EXTRACTION PHACO AND INTRAOCULAR LENS PLACEMENT ([place]);  Surgeon: Marchia Meiers, MD;  Location: [facility] ORS;  Service: Ophthalmology;  Laterality: Right;  [place] 01:15 CDE 16.46 Fluid pack lot # [id number]   CATARACT EXTRACTION W/PHACO Left 04/27/2018   Procedure: CATARACT EXTRACTION PHACO AND INTRAOCULAR LENS PLACEMENT ([place])- LEFT DIABETIC;  Surgeon: Marchia Meiers, MD;  Location: [facility] ORS;  Service: Ophthalmology;  Laterality: Left;  Lot # [id number] [place]: 00:46.3 CDE: 8.07    CYST EXCISION     FOREHEAD   FOOT SURGERY     CYST   RIGHT/LEFT HEART CATH AND CORONARY ANGIOGRAPHY N/A 03/31/2020   Procedure: RIGHT/LEFT HEART CATH AND CORONARY ANGIOGRAPHY;  Surgeon: Wellington Hampshire, MD;  Location: [facility];  Service: Cardiovascular;  Laterality: N/A;   TUBAL LIGATION       Home Medications:  Prior to Admission medications   Medication Sig Start Date End Date Taking? Authorizing Provider  amiodarone (PACERONE) 200 MG tablet Take 1 tablet (200 mg total) by mouth daily. 11/03/20  Yes Gollan, Kathlene November, MD  apixaban (ELIQUIS) 5 MG TABS tablet Take 1 tablet (5 mg total) by mouth 2 (two) times  daily. 11/03/20  Yes Minna Merritts, MD  atorvastatin (LIPITOR) 80 MG tablet Take 1 tablet (80 mg total) by mouth at bedtime. 11/03/20  Yes Minna Merritts, MD  carvedilol (COREG) 3.125 MG tablet Take 1 tablet (3.125 mg total) by mouth 2 (two) times daily. 11/03/20  Yes Minna Merritts, MD  citalopram (CELEXA) 40 MG tablet Take 40 mg by mouth daily. 06/14/20  Yes [provider]  docusate sodium (COLACE) 100 MG capsule Take 100 mg by mouth at bedtime as needed for mild constipation or moderate constipation.   Yes [provider]  famotidine (PEPCID) 40 MG tablet Take 40 mg by mouth at bedtime.   Yes [provider]  fenofibrate 160 MG tablet Take 160 mg by mouth daily.   Yes [provider]  ferrous sulfate 325 (65 FE) MG tablet Take 325 mg by mouth daily. 06/14/20  Yes [provider]  gabapentin (NEURONTIN) 600 MG tablet Take 600 mg by mouth 2 (two) times  daily.  11/20/19  Yes [provider]  hydrOXYzine (VISTARIL) 50 MG capsule Take 50 mg by mouth every 6 (six) hours as needed for anxiety. 03/14/20  Yes [provider]  ipratropium-albuterol (DUONEB) 0.5-2.5 (3) MG/3ML SOLN Take 3 mLs by nebulization 4 (four) times daily.   Yes [provider]  irbesartan (AVAPRO) 300 MG tablet Take 0.5 tablets (150 mg total) by mouth daily. 06/30/20 06/25/21 Yes Loel Dubonnet, NP  levothyroxine (SYNTHROID) 75 MCG tablet Take 75 mcg by mouth daily before breakfast.   Yes [provider]  Melatonin 10 MG TABS Take 10 mg by mouth at bedtime as needed (sleep).   Yes [provider]  metFORMIN (GLUCOPHAGE) 500 MG tablet Take 500 mg by mouth 2 (two) times daily. 09/19/19  Yes [provider]  oxyCODONE-acetaminophen (PERCOCET) 10-325 MG tablet Take 1 tablet by mouth 4 (four) times daily as needed for pain.   Yes [provider]  pantoprazole (PROTONIX) 40 MG tablet Take 40 mg by mouth 2 (two) times daily. 11/11/20   Yes [provider]  predniSONE (DELTASONE) 10 MG tablet Take 4 tablets (40 mg total) by mouth daily for 3 days, THEN 2 tablets (20 mg total) daily for 3 days, THEN 1 tablet (10 mg total) daily for 3 days. 12/25/20 01/03/21 Yes Sharen Hones, MD  Tiotropium Bromide Monohydrate 2.5 MCG/ACT AERS Inhale 2 puffs into the lungs daily.    Yes [provider]  Torsemide 40 MG TABS Take 40 mg by mouth daily. Alternating with 20 mg by mouth every other day 12/25/20  Yes Sharen Hones, MD  Vitamin D, Ergocalciferol, (DRISDOL) 1.25 MG (50000 UNIT) CAPS capsule Take 50,000 Units by mouth once a week.   Yes [provider]  albuterol (VENTOLIN HFA) 108 (90 Base) MCG/ACT inhaler Inhale 2 puffs into the lungs every 4 (four) hours as needed for wheezing or shortness of breath.     [provider]  lidocaine (LIDODERM) 5 % Place 3 patches onto the skin daily. (Remove after 12 hours and keep off for 12 hours)    [provider]  nicotine (NICODERM CQ - DOSED IN MG/24 HOURS) 21 mg/24hr patch Place 1 patch (21 mg total) onto the skin daily. Patient not taking: No sig reported 08/15/20   Arvil Chaco, PA-C    Inpatient Medications: Scheduled Meds:  amiodarone  200 mg Oral Daily   apixaban  5 mg Oral BID   atorvastatin  80 mg Oral QHS   citalopram  20 mg Oral Daily   famotidine  40 mg Oral QHS   fenofibrate  160 mg Oral Daily   ferrous sulfate  325 mg Oral Q breakfast   furosemide  20 mg Intravenous BID   gabapentin  600 mg Oral BID   ipratropium-albuterol  3 mL Nebulization QID   levothyroxine  75 mcg Oral QAC breakfast   lidocaine  3 patch Transdermal Q24H   pantoprazole  40 mg Oral BID   tiotropium  18 mcg Inhalation Daily   [START ON 01/05/2021] Vitamin D (Ergocalciferol)  50,000 Units Oral Weekly   Continuous Infusions:  PRN Meds: acetaminophen **OR** acetaminophen, albuterol, docusate sodium, hydrOXYzine, magnesium hydroxide, melatonin, ondansetron **OR**  ondansetron (ZOFRAN) IV, oxyCODONE-acetaminophen **AND** oxyCODONE, traZODone  Allergies:    Allergies  Allergen Reactions   Altace [Ramipril] Swelling    Social History:   Social History   Socioeconomic History   Marital status: Widowed    Spouse name: Not on file  Number of children: 4   Years of education: Not on file   Highest education level: Not on file  Occupational History   Occupation: Health and safety inspector  Tobacco Use   Smoking status: Former    Packs/day: 1.00    Years: 50.00    Pack years: 50.00    Types: Cigarettes   Smokeless tobacco: Never  Vaping Use   Vaping Use: Never used  Substance and Sexual Activity   Alcohol use: Not Currently   Drug use: Yes    Comment: prescribed oxy   Sexual activity: Not on file  Other Topics Concern   Not on file  Social History Narrative   Not on file   Social Determinants of Health   Financial Resource Strain: Not on file  Food Insecurity: Not on file  Transportation Needs: Not on file  Physical Activity: Not on file  Stress: Not on file  Social Connections: Not on file  Intimate Partner Violence: Not on file    Family History:    Family History  Problem Relation Age of Onset   Heart disease Mother    Cancer Father      ROS:  Please see the history of present illness.  Review of Systems  Constitutional:  Positive for chills, fever and malaise/fatigue.  Respiratory:  Positive for cough and wheezing. Negative for shortness of breath.   Cardiovascular:  Positive for orthopnea and leg swelling. Negative for chest pain and palpitations.  Musculoskeletal:  Positive for myalgias. Negative for falls.  Neurological:  Positive for dizziness, weakness and headaches. Negative for loss of consciousness.  All other systems reviewed and are negative.  All other ROS reviewed and negative.     Physical Exam/Data:   Vitals:   12/30/20 2300 12/31/20 0130 12/31/20 0530 12/31/20 0720  BP: (!) 118/58 (!) 133/56 (!) 123/59  (!) 134/54  Pulse: (!) 59 (!) 56 (!) 55 (!) 58  Resp: 17 14 14 11   Temp:      TempSrc:      SpO2: 97% 99% 99% 100%  Weight:      Height:        Intake/Output Summary (Last 24 hours) at 12/31/2020 0815 Last data filed at 12/30/2020 2109 Gross per 24 hour  Intake --  Output 700 ml  Net -700 ml   Last 3 Weights 12/30/2020 12/25/2020 12/24/2020  Weight (lbs) 229 lb 250 lb 252 lb 10.4 oz  Weight (kg) 103.874 kg 113.399 kg 114.6 kg     Body mass index is 41.88 kg/m.  General:  Obese female, NAD.  Joined by her daughter. HEENT: normal Lymph: no adenopathy Neck: JVD difficult to assess due to body habitus Endocrine:  No thryomegaly Vascular: No carotid bruits; FA pulses 2+ bilaterally without bruits  Cardiac:  normal S1, S2; RRR; 2/6 systolic murmur Lungs: Bilateral wheezing, bilateral rhonchi/crackles Abd: soft, nontender, no hepatomegaly  Ext: no edema Musculoskeletal:  No deformities, BUE and BLE strength normal and equal Skin: warm and dry  Neuro:  CNs 2-12 intact, no focal abnormalities noted Psych:  Normal affect   EKG:  The EKG was personally reviewed and demonstrates:  Sinus bradycardia with sinus arrhythmia, 42 bpm, poor R wave progression precordial leads, poor lead placement lead III Telemetry:  Telemetry was personally reviewed and demonstrates:  SR 50-60s, frequent ectopy  Relevant CV Studies: Echo 10/24/20  1. Left ventricular ejection fraction, by estimation, is 55 to 60%. The  left ventricle has normal function. The left ventricle has no regional  wall motion abnormalities. There is moderate left ventricular hypertrophy.  Left ventricular diastolic function   could not be evaluated.   2. Pulmonary artery pressure is at least upper normal to mildly elevated  (RVSP 30 mmHg plus central venous pressure). Right ventricular systolic  function is normal. The right ventricular size is mildly enlarged.   3. Left atrial size was mildly dilated.   4. Right atrial size was  mildly dilated.   5. The mitral valve is degenerative. No evidence of mitral valve  regurgitation. No evidence of mitral stenosis. Moderate to severe mitral  annular calcification.   6. The aortic valve has an indeterminant number of cusps. There is mild  calcification of the aortic valve. There is moderate thickening of the  aortic valve. Aortic valve regurgitation is not visualized. Moderate to  severe aortic valve stenosis. Aortic  valve area, by VTI measures 0.91 cm. Aortic valve mean gradient measures  29.0 mmHg.   05/29/20 biopsy done CXR IMPRESSION: 1. No pneumothorax after RIGHT lung biopsy. Expected post biopsy changes surrounding the nodule in the superior segment RIGHT LOWER LOBE. 2. Stable changes of chronic bronchitis and/or asthma.   MR brain 05/2020 without mets to brain   05/13/2020 NM PET  IMPRESSION: 1. 2.1 by 1.4 cm right lower lobe pulmonary nodule is hypermetabolic with maximum SUV of 5.4, compatible with malignancy. No appreciable nodal or metastatic spread. 2. Mildly accentuated activity along the inferior endplate of L2, probably incidental. 3. Hypodense 1.5 by 1.2 cm right inferior thyroid lobe nodule. Recommend thyroid US (ref: J Am Coll Radiol. 2015 Feb;12(2): 143-50). 4. Other imaging findings of potential clinical significance: Chronic right maxillary sinusitis. Aortic Atherosclerosis (ICD10-I70.0). Coronary atherosclerosis. Mild cardiomegaly. Mitral valve calcification. Diffuse hepatic steatosis. Cholelithiasis. Complex but photopenic left kidney upper pole cyst, most likely to be benign.   06/15/2019 CTA chest and aorta IMPRESSION: 1. Vascular findings and measurements pertinent to potential TAVR procedure, as detailed above. 2. Severe thickening calcification of the aortic valve, compatible with reported clinical history of severe aortic stenosis. 3. 1.9 x 1.4 x 1.6 cm macrolobulated nodule in the superior segment of the right lower lobe,  highly concerning for primary bronchogenic neoplasm. Further evaluation with PET-CT is recommended in the near future to better evaluate this finding and assess for potential metastatic disease. No definite metastatic disease is identified in the chest, abdomen or pelvis on today's examination. 4. Aortic atherosclerosis, in addition to left main and 3 vessel coronary artery disease. 5. Severe calcifications of the mitral annulus. 6. Severe hepatic steatosis. 7. Colonic diverticulosis without evidence of acute diverticulitis at this time. 8. Additional incidental findings, as above. These results will be called to the ordering clinician or representative by the Radiologist Assistant, and communication documented in the PACS or [employer].   CTA abdomen and pelvis  04/14/2020 IMPRESSION: 1. Vascular findings and measurements pertinent to potential TAVR procedure, as detailed above. 2. Severe thickening calcification of the aortic valve, compatible with reported clinical history of severe aortic stenosis. 3. 1.9 x 1.4 x 1.6 cm macrolobulated nodule in the superior segment of the right lower lobe, highly concerning for primary bronchogenic neoplasm. Further evaluation with PET-CT is recommended in the near future to better evaluate this finding and assess for potential metastatic disease. No definite metastatic disease is identified in the chest, abdomen or pelvis on today's examination. 4. Aortic atherosclerosis, in addition to left main and 3 vessel coronary artery disease. 5. Severe calcifications of the mitral annulus. 6. Severe  hepatic steatosis. 7. Colonic diverticulosis without evidence of acute diverticulitis at this time. 8. Additional incidental findings, as above. These results will be called to the ordering clinician or representative by the Radiologist Assistant, and communication documented in the PACS or [employer].   Cardiac  CTA 04/14/2020 IMPRESSION: 1. Trileaflet aortic valve with moderately calcified leaflets and moderately restricted leaflet opening. Aortic valve calcium score 1257. (Severe if > 2000 in males). Annular measurements suitable for delivery of a 26 mm Edwards-SAPIEN 3 Ultra valve. 2. Sufficient coronary to annulus distance. 3. Optimum Fluoroscopic Angle for Delivery: RAO 2 CRA 3. 4. No thrombus in the left atrial appendage.    US carotid 04/01/2020 IMPRESSION: Color duplex indicates minimal heterogeneous and calcified plaque, with no hemodynamically significant stenosis by duplex criteria in the extracranial cerebrovascular circulation.   R/LHC 03/31/2020 Prox RCA lesion is 30% stenosed. Mid RCA lesion is 40% stenosed. RPDA lesion is 30% stenosed. Prox Cx lesion is 60% stenosed. Mid LAD lesion is 20% stenosed. Mid Cx to Dist Cx lesion is 20% stenosed. 1.  Mild to moderate nonobstructive coronary artery disease.  Worst stenosis is 60% in the proximal left circumflex.  No evidence of obstructive disease.  Coronary arteries are overall moderately calcified. 2.  Right heart catheterization showed moderately elevated left-sided filling pressures, moderate pulmonary hypertension and moderately reduced cardiac output. 3.  Severe aortic stenosis with mean gradient of 31 mmHg and calculated valve area of 0.7 cm. Recommendations: The patient is significantly volume overloaded.  I switched furosemide to intravenous 20 mg twice daily.  I increased carvedilol for better blood pressure control. Recommend medical therapy for nonobstructive coronary artery disease. Recommend outpatient TAVR evaluation.   Echo 03/29/2020  1. Left ventricular ejection fraction, by estimation, is 55 to 60%. The  left ventricle has normal function. The left ventricle has no regional  wall motion abnormalities. There is mild left ventricular hypertrophy.  Left ventricular diastolic parameters  are consistent with  Grade I diastolic dysfunction (impaired relaxation).   2. Right ventricular systolic function is normal. The right ventricular  size is normal. Tricuspid regurgitation signal is inadequate for assessing  PA pressure.   3. Left atrial size was mildly dilated.   4. The aortic valve was not well visualized. Moderate to severe aortic  valve stenosis. Aortic valve area, by VTI measures 1.11 cm. Aortic valve  mean gradient measures 31.0 mmHg. Aortic valve Vmax measures 3.66 m/s.   Laboratory Data:  High Sensitivity Troponin:   Recent Labs  Lab 12/22/20 1218 12/24/20 2005 12/24/20 2227 12/30/20 2017 12/30/20 2218  TROPONINIHS 26* 22* 24* 10 10     Chemistry Recent Labs  Lab 12/25/20 0443 12/30/20 1634 12/31/20 0657  NA 141 137 141  K 4.1 4.8 4.4  CL 91* 94* 96*  CO2 41* 31 33*  GLUCOSE 111* 140* 100*  BUN 50* 67* 56*  CREATININE 1.69* 2.47* 2.22*  CALCIUM 9.0 8.6* 8.8*  GFRNONAA 33* 21* 24*  ANIONGAP 9 12 12     No results for input(s): PROT, ALBUMIN, AST, ALT, ALKPHOS, BILITOT in the last 168 hours. Hematology Recent Labs  Lab 12/25/20 0443 12/30/20 1634 12/31/20 0657  WBC 9.9 11.2* 9.0  RBC 3.47* 3.67* 3.35*  HGB 10.0* 10.6* 9.9*  HCT 31.9* 33.9* 31.7*  MCV 91.9 92.4 94.6  MCH 28.8 28.9 29.6  MCHC 31.3 31.3 31.2  RDW 14.9 15.1 15.2  PLT 237 248 226   BNP Recent Labs  Lab 12/30/20 2017  BNP 204.5*  DDimer No results for input(s): DDIMER in the last 168 hours.   Radiology/Studies:  DG Chest Port 1 View  Result Date: 12/30/2020 CLINICAL DATA:  Hypotension fatigue EXAM: PORTABLE CHEST 1 VIEW COMPARISON:  12/22/2020, CT 04/08/2020, radiograph 05/29/2020, 09/25/2020 FINDINGS: Cardiomegaly with vascular congestion. Diffuse increased interstitial opacity some of which is felt chronic. No pleural effusion or pneumothorax. Aortic atherosclerosis IMPRESSION: 1. Cardiomegaly with vascular congestion. 2. Diffuse increased interstitial opacity likely due to chronic  interstitial changes. No acute superimposed confluent airspace disease Electronically Signed   By: Donavan Foil M.D.   On: 12/30/2020 18:25     Assessment and Plan:   Weakness, Fatigue COPD exacerbation Exacerbation of diastolic heart failure Aortic Stenosis, moderate to severe --Chronic fatigue has been reported as in the past and likely multifactorial in setting of her comorbid conditions including sedentary lifestyle, COPD, moderate to severe AS, CKD, morbid obesity, recent lung CA s/p radiation treatment. In the past, diuresis complicated by renal function with known CKD and volume status difficult to ascertain in past due to obesity. CXR with vascular congestion. BNP 200s. Restarting diuresis as below. Given recent report of fever, possible underlying infection contributing to presentation as well.  --Monitor I/O, Daily wt, Daily BMET. Cr 2.47  2.22 and close to baseline --Defer any workup for possible infection to IM.  --Continue inhalers. Consider Xopenex.  --Restart PTA torsemide 40mg  alternating with 20mg  daily. Per MD, continue to hold BB for now.    Elevated HS Tn --No CP. Previous echo and cath above. Nonobstructive CAD. EF nl. HS Tn minimally elevated at 10 x2. Not consistent with ACS. Suspect supply demand ischemia in the setting of comorbids. No further ischemic workup at this time. Continue medical tx.  Sinus bradycardia, PACs History of Junctional rhythm, post termination pauses  History of SVT / Atrial fibrillation  --Telemetry as above with NSR with ectopy. Consider that lower BP could be 2/2 ectopy. Known history of SB, junctional rhythm, post termination pauses, atrial fibrillation, and short runs SVT. Continue amiodarone and Eliquis 5mg  BID. Per MD, will continue to hold BB for now. Daily CBC.  Continue to monitor on telemetry.     Aortic stenosis, moderate to severe -- Consider as contributing to her overall presentation.  Followed by the TAVR team with plan for  repeat echo in 6 months.  Possible TAVR with worsening valvular disease. Recent echo as above.   AOCKD --Baseline Cr 1.7-2.0.  --Most recent Cr improved and close to baseline.  --Daily BMET.   DM2 --SSI, per IM.   Hypothyroidism --Per IM    For questions or updates, please contact [place] Please consult [email or web address] for contact info under    Signed, Arvil Chaco, PA-C  12/31/2020 8:15 AM

## 2020-12-31 NOTE — Progress Notes (Signed)
ANTICOAGULATION CONSULT NOTE - Initial Consult  Pharmacy Consult for apixaban Indication: atrial fibrillation  Allergies  Allergen Reactions   Altace [Ramipril] Swelling    Patient Measurements: Height: 5\' 2"  (157.5 cm) Weight: 103.9 kg (229 lb) IBW/kg (Calculated) : 50.1 Heparin Dosing Weight:    Vital Signs: BP: 134/54 (08/17 0720) Pulse Rate: 58 (08/17 0720)  Labs: Recent Labs    12/30/20 1634 12/30/20 2017 12/30/20 2218 12/31/20 0657  HGB 10.6*  --   --  9.9*  HCT 33.9*  --   --  31.7*  PLT 248  --   --  226  CREATININE 2.47*  --   --  2.22*  TROPONINIHS  --  10 10  --     Estimated Creatinine Clearance: 27.4 mL/min (A) (by C-G formula based on SCr of 2.22 mg/dL (H)).   Medical History: Past Medical History:  Diagnosis Date   Anxiety    Asthma    CHF (congestive heart failure) ([place])    2018   COPD (chronic obstructive pulmonary disease) (HCC)    Diabetes mellitus without complication (HCC)    Dyspnea    GERD (gastroesophageal reflux disease)    History of hiatal hernia    History of orthopnea    History of radiation therapy 07/29/20-08/08/20   Right Lung, SBRT Dr. Gery Pray   Hypothyroidism    Neuropathy    Oxygen deficiency    3L/HS   Pain    BACK/DDD   Peripheral vascular disease (HCC)    RLS (restless legs syndrome)    Sleep apnea    Wheezing     Medications:  (Not in a hospital admission)  Scheduled:   amiodarone  200 mg Oral Daily   apixaban  5 mg Oral BID   atorvastatin  80 mg Oral QHS   citalopram  40 mg Oral Daily   famotidine  40 mg Oral QHS   fenofibrate  160 mg Oral Daily   ferrous sulfate  325 mg Oral Q breakfast   furosemide  20 mg Intravenous BID   gabapentin  600 mg Oral BID   ipratropium-albuterol  3 mL Nebulization QID   levothyroxine  75 mcg Oral QAC breakfast   lidocaine  3 patch Transdermal Q24H   pantoprazole  40 mg Oral BID   tiotropium  18 mcg Inhalation Daily   [START ON 01/05/2021] Vitamin D (Ergocalciferol)   50,000 Units Oral Weekly   Infusions:   Assessment: 70 yo F with Paroxysmal a-flutter/fib on apixaban PTA. Currently ordered apixaban 5 mg BID (home dose) for Afib 70 yo  103.9 kg  Scr 2.22   Hgb 9.9  Plt 226  Goal of Therapy:  Monitor platelets by anticoagulation protocol: Yes   Plan:  Will continue current order for apixaban 5 mg po BID F/u CBC/Scr per protocol  Mareesa Gathright A 12/31/2020,8:48 AM

## 2020-12-31 NOTE — ED Notes (Signed)
RN aware bed assigned ?

## 2020-12-31 NOTE — Progress Notes (Signed)
  Heart failure consult requested, HF RN updated.

## 2020-12-31 NOTE — ED Notes (Signed)
Patient given warm blanket.

## 2021-01-01 ENCOUNTER — Institutional Professional Consult (permissible substitution): Payer: 59 | Admitting: Pulmonary Disease

## 2021-01-01 ENCOUNTER — Inpatient Hospital Stay: Payer: 59

## 2021-01-01 DIAGNOSIS — I959 Hypotension, unspecified: Secondary | ICD-10-CM | POA: Diagnosis not present

## 2021-01-01 DIAGNOSIS — N1832 Chronic kidney disease, stage 3b: Secondary | ICD-10-CM

## 2021-01-01 DIAGNOSIS — N179 Acute kidney failure, unspecified: Secondary | ICD-10-CM | POA: Diagnosis not present

## 2021-01-01 DIAGNOSIS — R001 Bradycardia, unspecified: Secondary | ICD-10-CM | POA: Diagnosis not present

## 2021-01-01 DIAGNOSIS — J432 Centrilobular emphysema: Secondary | ICD-10-CM

## 2021-01-01 DIAGNOSIS — R531 Weakness: Secondary | ICD-10-CM | POA: Diagnosis not present

## 2021-01-01 LAB — BASIC METABOLIC PANEL
Anion gap: 10 (ref 5–15)
BUN: 59 mg/dL — ABNORMAL HIGH (ref 8–23)
CO2: 34 mmol/L — ABNORMAL HIGH (ref 22–32)
Calcium: 8.3 mg/dL — ABNORMAL LOW (ref 8.9–10.3)
Chloride: 95 mmol/L — ABNORMAL LOW (ref 98–111)
Creatinine, Ser: 2.49 mg/dL — ABNORMAL HIGH (ref 0.44–1.00)
GFR, Estimated: 21 mL/min — ABNORMAL LOW (ref >60)
Glucose, Bld: 115 mg/dL — ABNORMAL HIGH (ref 70–99)
Potassium: 4.2 mmol/L (ref 3.5–5.1)
Sodium: 139 mmol/L (ref 135–145)

## 2021-01-01 LAB — GLUCOSE, CAPILLARY
Glucose-Capillary: 111 mg/dL — ABNORMAL HIGH (ref 70–99)
Glucose-Capillary: 189 mg/dL — ABNORMAL HIGH (ref 70–99)
Glucose-Capillary: 157 mg/dL — ABNORMAL HIGH (ref 70–99)

## 2021-01-01 NOTE — Progress Notes (Signed)
Mobility Specialist - Progress Note   01/01/21 1300  Mobility  Activity Ambulated in room  Level of Assistance Standby assist, set-up cues, supervision of patient - no hands on  Assistive Device Front wheel walker  Distance Ambulated (ft) 15 ft  Mobility Ambulated with assistance in room  Mobility Response Tolerated well  Mobility performed by Mobility specialist  $Mobility charge 1 Mobility    Pre-mobility: 51 HR, 93% SpO2 During mobility: 66 HR, 98% SpO2 Post-mobility: 51 HR, 96% SpO2   Pt lying in bed upon arrival, utilizing 3L. Pt sat EOB with minA, no dizziness. Pt stood and performed static standing, marching, forward/retro ambulation, and turns with supervision. No SOB. No complaints. Pt left in bed with needs in reach, family at bedside.    Kathee Delton Mobility Specialist 01/01/21, 1:52 PM

## 2021-01-01 NOTE — Progress Notes (Signed)
PROGRESS NOTE    Kristin [name]  ZLD:[id number] DOB: Jan 11, 1951 DOA: 12/30/2020 PCP: [place] [place]  Outpatient Specialists: [facility]    Brief Narrative:   Kristin [name] is a 71 y.o. female with medical history significant for asthma, CHF, COPD, type diabetes mellitus, GERD, hypothyroidism and peripheral neuropathy as well as peripheral vascular disease and sleep apnea, who presented to the ER with acute onset of generalized weakness with associated dizziness and lightheadedness and mild headache today.  She was seen in the gastroenterology clinic today when she was found to be hypotensive and referred to the ER.  She is not sure how much beta-blocker dose with Coreg she is taken today.  She denies any worsening orthopnea or dyspnea on exertion.  She admitted to lower extremity edema without worsening.  She admitted to chills without measured fever.  No dysuria, oliguria or hematuria or flank pain.  She denied any cough or wheezing or dyspnea or palpitations or chest pain.  She had 2 recent intermittent weight gain.   Assessment & Plan:   Active Problems:   Hypotension  # Generalized weakness Presented from  GI clinic for acute onset weakness and hypotension. Here initially hypotensive, HR in 50s. Hypotension resolved, patient feels as though she is back to baseline. She does not think she mis-took meds. Etiology likely multifactorial 2/2 multiple severe comorbidities and poor functional baseline. BB has been held. Symptoms have not returned but remains bradycardic - BB held - maintain tele  # HFpEF # Severe aortic stenosis Appears compensated. 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 Does not appear fluid overloaded - hold home torsemide, irbesartan - nephrology consult  # COPD # Chronic hypoxic respiratory failure Without exacerbation - cont home O2 3 L, home meds  # OSA On trilogy at home - bipap qhs ordered  # Hypothyroid - home synthroid  # MDD - home celexa  # Neuropathy - home gabapentin  # Lung cancer Followed by oncology in [place]. Is s/p radiation - close outpt f/u  DVT prophylaxis: eliquis Code Status: full Family Communication: daughter updated @ bedside 8/18  Level of care: Med-Surg Status is: Observation  The patient will require care spanning > 2 midnights and should be moved to inpatient because: Inpatient level of care appropriate due to severity of illness  Dispo: The patient is from: Home              Anticipated d/c is to: Home with home health, which was ordered at recent hospitalization              Patient currently is not medically stable to d/c.   Difficult to place patient No        Consultants:  cardiology  Procedures: none  Antimicrobials:  none    Subjective: This morning feels back to baseline. Breathing is at baseline. No chest pain. Doesn't feel weak. Has appetite.  Objective: Vitals:   01/01/21 0426 01/01/21 0500 01/01/21 0747 01/01/21 0749  BP: (!) 119/46   (!) 114/52  Pulse: (!) 44  (!) 44 (!) 40  Resp: 20  18 18   Temp: 97.9 F (36.6 C)  TempSrc:      SpO2: 99%  98% 99%  Weight:  107.3 kg    Height:        Intake/Output Summary (Last 24 hours) at 01/01/2021 1003 Last data filed at 12/31/2020 1023 Gross per 24 hour  Intake --  Output 1000 ml  Net -1000 ml   Filed Weights   12/30/20 1608 01/01/21 0500  Weight: 103.9 kg 107.3 kg    Examination:  General exam: Appears calm and comfortable  Respiratory system: scattered exp wheeze faint Cardiovascular system: S1 & S2 heard, RR, harsh systolic murmur Gastrointestinal system: Abdomen is  obese, soft and nontender. No organomegaly or masses felt. Normal bowel sounds heard. Central nervous system: Alert and oriented. No focal neurological deficits. Extremities: Symmetric 5 x 5 power. Skin: No rashes, lesions or ulcers Psychiatry: Judgement and insight appear normal. Mood & affect appropriate.     Data Reviewed: I have personally reviewed following labs and imaging studies  CBC: Recent Labs  Lab 12/30/20 1634 12/31/20 0657  WBC 11.2* 9.0  HGB 10.6* 9.9*  HCT 33.9* 31.7*  MCV 92.4 94.6  PLT 248 277   Basic Metabolic Panel: Recent Labs  Lab 12/30/20 1634 12/31/20 0657 01/01/21 0645  NA 137 141 139  K 4.8 4.4 4.2  CL 94* 96* 95*  CO2 31 33* 34*  GLUCOSE 140* 100* 115*  BUN 67* 56* 59*  CREATININE 2.47* 2.22* 2.49*  CALCIUM 8.6* 8.8* 8.3*   GFR: Estimated Creatinine Clearance: 24.9 mL/min (A) (by C-G formula based on SCr of 2.49 mg/dL (H)). Liver Function Tests: No results for input(s): AST, ALT, ALKPHOS, BILITOT, PROT, ALBUMIN in the last 168 hours. No results for input(s): LIPASE, AMYLASE in the last 168 hours. No results for input(s): AMMONIA in the last 168 hours. Coagulation Profile: No results for input(s): INR, PROTIME in the last 168 hours. Cardiac Enzymes: No results for input(s): CKTOTAL, CKMB, CKMBINDEX, TROPONINI in the last 168 hours. BNP (last 3 results) No results for input(s): PROBNP in the last 8760 hours. HbA1C: No results for input(s): HGBA1C in the last 72 hours. CBG: Recent Labs  Lab 12/25/20 1158 01/01/21 0800  GLUCAP 149* 111*   Lipid Profile: No results for input(s): CHOL, HDL, LDLCALC, TRIG, CHOLHDL, LDLDIRECT in the last 72 hours. Thyroid Function Tests: No results for input(s): TSH, T4TOTAL, FREET4, T3FREE, THYROIDAB in the last 72 hours. Anemia Panel: No results for input(s): VITAMINB12, FOLATE, FERRITIN, TIBC, IRON, RETICCTPCT in the last 72 hours. Urine analysis:    Component Value Date/Time   COLORURINE STRAW  (A) 12/30/2020 2017   APPEARANCEUR CLEAR (A) 12/30/2020 2017   LABSPEC 1.009 12/30/2020 2017   PHURINE 6.0 12/30/2020 2017   GLUCOSEU NEGATIVE 12/30/2020 2017   HGBUR NEGATIVE 12/30/2020 2017   BILIRUBINUR NEGATIVE 12/30/2020 2017   KETONESUR NEGATIVE 12/30/2020 2017   PROTEINUR NEGATIVE 12/30/2020 2017   NITRITE NEGATIVE 12/30/2020 2017   LEUKOCYTESUR NEGATIVE 12/30/2020 2017   Sepsis Labs: @LABRCNTIP (procalcitonin:4,lacticidven:4)  ) Recent Results (from the past 240 hour(s))  Resp Panel by RT-PCR (Flu A&B, Covid) Nasopharyngeal Swab     Status: None   Collection Time: 12/22/20 10:16 AM   Specimen: Nasopharyngeal Swab; Nasopharyngeal(NP) swabs in vial transport medium  Result Value Ref Range Status   SARS Coronavirus 2 by RT PCR NEGATIVE NEGATIVE Final    Comment: (NOTE) SARS-CoV-2 target nucleic acids are NOT DETECTED.  The SARS-CoV-2 RNA is generally detectable in upper respiratory specimens during the acute phase of infection. The lowest  concentration of SARS-CoV-2 viral copies this assay can detect is 138 copies/mL. A negative result does not preclude SARS-Cov-2 infection and should not be used as the sole basis for treatment or other patient management decisions. A negative result may occur with  improper specimen collection/handling, submission of specimen other than nasopharyngeal swab, presence of viral mutation(s) within the areas targeted by this assay, and inadequate number of viral copies(<138 copies/mL). A negative result must be combined with clinical observations, patient history, and epidemiological information. The expected result is Negative.  Fact Sheet for Patients:  EntrepreneurPulse.com.au  Fact Sheet for Healthcare Providers:  IncredibleEmployment.be  This test is no t yet approved or cleared by the [place] FDA and  has been authorized for detection and/or diagnosis of SARS-CoV-2 by FDA under an Emergency  Use Authorization (EUA). This EUA will remain  in effect (meaning this test can be used) for the duration of the COVID-19 declaration under Section 564(b)(1) of the Act, 21 U.S.C.section 360bbb-3(b)(1), unless the authorization is terminated  or revoked sooner.       Influenza A by PCR NEGATIVE NEGATIVE Final   Influenza B by PCR NEGATIVE NEGATIVE Final    Comment: (NOTE) The Xpert Xpress SARS-CoV-2/FLU/RSV plus assay is intended as an aid in the diagnosis of influenza from Nasopharyngeal swab specimens and should not be used as a sole basis for treatment. Nasal washings and aspirates are unacceptable for Xpert Xpress SARS-CoV-2/FLU/RSV testing.  Fact Sheet for Patients: EntrepreneurPulse.com.au  Fact Sheet for Healthcare Providers: IncredibleEmployment.be  This test is not yet approved or cleared by the [place] FDA and has been authorized for detection and/or diagnosis of SARS-CoV-2 by FDA under an Emergency Use Authorization (EUA). This EUA will remain in effect (meaning this test can be used) for the duration of the COVID-19 declaration under Section 564(b)(1) of the Act, 21 U.S.C. section 360bbb-3(b)(1), unless the authorization is terminated or revoked.  Performed at [facility], [address] [place], [place] [address]   Blood culture (routine x 2)     Status: None   Collection Time: 12/22/20 12:23 PM   Specimen: BLOOD  Result Value Ref Range Status   Specimen Description BLOOD BLOOD RIGHT FOREARM  Final   Special Requests   Final    BOTTLES DRAWN AEROBIC AND ANAEROBIC Blood Culture adequate volume   Culture   Final    NO GROWTH 5 DAYS Performed at [facility], [address], [place], [place] [address]    Report Status 12/27/2020 FINAL  Final  Blood culture (routine x 2)     Status: None   Collection Time: 12/22/20 12:23 PM   Specimen: BLOOD  Result Value Ref Range Status   Specimen Description  BLOOD BLOOD LEFT FOREARM  Final   Special Requests   Final    BOTTLES DRAWN AEROBIC AND ANAEROBIC Blood Culture adequate volume   Culture   Final    NO GROWTH 5 DAYS Performed at [facility], [address] [place], [place] [address]    Report Status 12/27/2020 FINAL  Final  Resp Panel by RT-PCR (Flu A&B, Covid) Nasopharyngeal Swab     Status: None   Collection Time: 12/30/20  5:28 PM   Specimen: Nasopharyngeal Swab; Nasopharyngeal(NP) swabs in vial transport medium  Result Value Ref Range Status   SARS Coronavirus 2 by RT PCR NEGATIVE NEGATIVE Final    Comment: (NOTE) SARS-CoV-2 target nucleic acids are NOT DETECTED.  The SARS-CoV-2 RNA is generally detectable in upper  respiratory specimens during the acute phase of infection. The lowest concentration of SARS-CoV-2 viral copies this assay can detect is 138 copies/mL. A negative result does not preclude SARS-Cov-2 infection and should not be used as the sole basis for treatment or other patient management decisions. A negative result may occur with  improper specimen collection/handling, submission of specimen other than nasopharyngeal swab, presence of viral mutation(s) within the areas targeted by this assay, and inadequate number of viral copies(<138 copies/mL). A negative result must be combined with clinical observations, patient history, and epidemiological information. The expected result is Negative.  Fact Sheet for Patients:  EntrepreneurPulse.com.au  Fact Sheet for Healthcare Providers:  IncredibleEmployment.be  This test is no t yet approved or cleared by the [place] FDA and  has been authorized for detection and/or diagnosis of SARS-CoV-2 by FDA under an Emergency Use Authorization (EUA). This EUA will remain  in effect (meaning this test can be used) for the duration of the COVID-19 declaration under Section 564(b)(1) of the Act, 21 U.S.C.section 360bbb-3(b)(1),  unless the authorization is terminated  or revoked sooner.       Influenza A by PCR NEGATIVE NEGATIVE Final   Influenza B by PCR NEGATIVE NEGATIVE Final    Comment: (NOTE) The Xpert Xpress SARS-CoV-2/FLU/RSV plus assay is intended as an aid in the diagnosis of influenza from Nasopharyngeal swab specimens and should not be used as a sole basis for treatment. Nasal washings and aspirates are unacceptable for Xpert Xpress SARS-CoV-2/FLU/RSV testing.  Fact Sheet for Patients: EntrepreneurPulse.com.au  Fact Sheet for Healthcare Providers: IncredibleEmployment.be  This test is not yet approved or cleared by the [place] FDA and has been authorized for detection and/or diagnosis of SARS-CoV-2 by FDA under an Emergency Use Authorization (EUA). This EUA will remain in effect (meaning this test can be used) for the duration of the COVID-19 declaration under Section 564(b)(1) of the Act, 21 U.S.C. section 360bbb-3(b)(1), unless the authorization is terminated or revoked.  Performed at [facility], [address] [place], [place] [address]          Radiology Studies: DG Chest [place] 1 View  Result Date: 12/30/2020 CLINICAL DATA:  Hypotension fatigue EXAM: PORTABLE CHEST 1 VIEW COMPARISON:  12/22/2020, CT 04/08/2020, radiograph 05/29/2020, 09/25/2020 FINDINGS: Cardiomegaly with vascular congestion. Diffuse increased interstitial opacity some of which is felt chronic. No pleural effusion or pneumothorax. Aortic atherosclerosis IMPRESSION: 1. Cardiomegaly with vascular congestion. 2. Diffuse increased interstitial opacity likely due to chronic interstitial changes. No acute superimposed confluent airspace disease Electronically Signed   By: Donavan Foil M.D.   On: 12/30/2020 18:25        Scheduled Meds:  apixaban  5 mg Oral BID   atorvastatin  80 mg Oral QHS   citalopram  40 mg Oral Daily   famotidine  40 mg Oral QHS   fenofibrate   160 mg Oral Daily   ferrous sulfate  325 mg Oral Q breakfast   gabapentin  600 mg Oral BID   ipratropium-albuterol  3 mL Nebulization QID   levothyroxine  75 mcg Oral QAC breakfast   lidocaine  3 patch Transdermal Q24H   pantoprazole  40 mg Oral BID   tiotropium  18 mcg Inhalation Daily   [START ON 01/05/2021] Vitamin D (Ergocalciferol)  50,000 Units Oral Weekly   Continuous Infusions:   LOS: 1 day    Time spent: 30 min    Desma Maxim, MD [facility]   If 7PM-7AM, please contact  night-coverage [email or web address] Password [facility] 01/01/2021, 10:03 AM

## 2021-01-01 NOTE — Progress Notes (Signed)
PHARMACY NOTE:  RENAL DOSAGE ADJUSTMENT  Current regimen includes a mismatch between dosage and estimated renal function.    Current dosage: gabapentin 600 mg BID  Indication: peripheral neuropathy  Renal Function: CrCl 24.9   Estimated Creatinine Clearance: 24.9 mL/min (A) (by C-G formula based on SCr of 2.49 mg/dL (H)). []      On intermittent HD, scheduled: []      On CRRT    Dosage has been changed to:  gabapentin 300 mg BID  Additional comments: Patient with AKI, creatinine trending up superimposed on CKD with baseline creatinine 1.7. Adjusting gabapentin for patients declining renal function.   Thank you for allowing pharmacy to be a part of this patient's care.   Wynelle Cleveland, PharmD Pharmacy Resident  01/01/2021 1:08 PM

## 2021-01-01 NOTE — Plan of Care (Signed)
  Problem: Clinical Measurements: Goal: Ability to maintain clinical measurements within normal limits will improve Outcome: Progressing Goal: Will remain free from infection Outcome: Progressing Goal: Diagnostic test results will improve Outcome: Progressing Goal: Respiratory complications will improve Outcome: Progressing Goal: Cardiovascular complication will be avoided Outcome: Progressing   Problem: Activity: Goal: Risk for activity intolerance will decrease Outcome: Progressing   Pt is involved in and agrees with the plan of care. V/S stable. Bradycardic 40s-50s, HR dropped as low as 36 unsustained. Oncall Cardio informed, no orders. Kept on home 02 at 3lpm/[place] with sats at 98-99%.

## 2021-01-01 NOTE — Progress Notes (Signed)
Progress Note  Patient Name: Kristin [name] Date of Encounter: 01/01/2021  [facility] Cardiologist: Ida Rogue, MD   Subjective   No complaints, resting comfortably in bed Sleepy Reports she was unaware blood pressure and heart rate are low   Inpatient Medications    Scheduled Meds:  apixaban  5 mg Oral BID   atorvastatin  80 mg Oral QHS   citalopram  40 mg Oral Daily   famotidine  40 mg Oral QHS   fenofibrate  160 mg Oral Daily   ferrous sulfate  325 mg Oral Q breakfast   gabapentin  600 mg Oral BID   ipratropium-albuterol  3 mL Nebulization QID   levothyroxine  75 mcg Oral QAC breakfast   lidocaine  3 patch Transdermal Q24H   pantoprazole  40 mg Oral BID   tiotropium  18 mcg Inhalation Daily   [START ON 01/05/2021] Vitamin D (Ergocalciferol)  50,000 Units Oral Weekly   Continuous Infusions:  PRN Meds: acetaminophen **OR** acetaminophen, albuterol, docusate sodium, hydrOXYzine, magnesium hydroxide, melatonin, ondansetron **OR** ondansetron (ZOFRAN) IV, traZODone   Vital Signs    Vitals:   01/01/21 0747 01/01/21 0749 01/01/21 1153 01/01/21 1525  BP:  (!) 114/52    Pulse: (!) 44 (!) 40 (!) 42 (!) 44  Resp: 18 18 16 18   Temp:      TempSrc:      SpO2: 98% 99% 97% 96%  Weight:      Height:        Intake/Output Summary (Last 24 hours) at 01/01/2021 1558 Last data filed at 01/01/2021 1300 Gross per 24 hour  Intake 480 ml  Output --  Net 480 ml   Last 3 Weights 01/01/2021 12/30/2020 12/25/2020  Weight (lbs) 236 lb 8.9 oz 229 lb 250 lb  Weight (kg) 107.3 kg 103.874 kg 113.399 kg      Telemetry    Sinus bradycardia rate 42- Personally Reviewed  ECG    - Personally Reviewed  Physical Exam   GEN: No acute distress.  Obese, lethargic Neck: No JVD Cardiac: Regular, bradycardic no murmurs, rubs, or gallops.  Respiratory: Clear to auscultation bilaterally. GI: Soft, nontender, non-distended  MS: No edema; No deformity. Neuro:  Nonfocal  Psych: Normal  affect   Labs    High Sensitivity Troponin:   Recent Labs  Lab 12/22/20 1218 12/24/20 2005 12/24/20 2227 12/30/20 2017 12/30/20 2218  TROPONINIHS 26* 22* 24* 10 10      Chemistry Recent Labs  Lab 12/30/20 1634 12/31/20 0657 01/01/21 0645  NA 137 141 139  K 4.8 4.4 4.2  CL 94* 96* 95*  CO2 31 33* 34*  GLUCOSE 140* 100* 115*  BUN 67* 56* 59*  CREATININE 2.47* 2.22* 2.49*  CALCIUM 8.6* 8.8* 8.3*  GFRNONAA 21* 24* 21*  ANIONGAP 12 12 10      Hematology Recent Labs  Lab 12/30/20 1634 12/31/20 0657  WBC 11.2* 9.0  RBC 3.67* 3.35*  HGB 10.6* 9.9*  HCT 33.9* 31.7*  MCV 92.4 94.6  MCH 28.9 29.6  MCHC 31.3 31.2  RDW 15.1 15.2  PLT 248 226    BNP Recent Labs  Lab 12/30/20 2017  BNP 204.5*     DDimer No results for input(s): DDIMER in the last 168 hours.   Radiology    DG Chest Port 1 View  Result Date: 12/30/2020 CLINICAL DATA:  Hypotension fatigue EXAM: PORTABLE CHEST 1 VIEW COMPARISON:  12/22/2020, CT 04/08/2020, radiograph 05/29/2020, 09/25/2020 FINDINGS: Cardiomegaly with vascular congestion.  Diffuse increased interstitial opacity some of which is felt chronic. No pleural effusion or pneumothorax. Aortic atherosclerosis IMPRESSION: 1. Cardiomegaly with vascular congestion. 2. Diffuse increased interstitial opacity likely due to chronic interstitial changes. No acute superimposed confluent airspace disease Electronically Signed   By: Donavan Foil M.D.   On: 12/30/2020 18:25    Cardiac Studies   Echocardiogram October 24, 2020  1. Left ventricular ejection fraction, by estimation, is 55 to 60%. The  left ventricle has normal function. The left ventricle has no regional  wall motion abnormalities. There is moderate left ventricular hypertrophy.  Left ventricular diastolic function   could not be evaluated.   2. Pulmonary artery pressure is at least upper normal to mildly elevated  (RVSP 30 mmHg plus central venous pressure). Right ventricular systolic   function is normal. The right ventricular size is mildly enlarged.   3. Left atrial size was mildly dilated.   4. Right atrial size was mildly dilated.   5. The mitral valve is degenerative. No evidence of mitral valve  regurgitation. No evidence of mitral stenosis. Moderate to severe mitral  annular calcification.   6. The aortic valve has an indeterminant number of cusps. There is mild  calcification of the aortic valve. There is moderate thickening of the  aortic valve. Aortic valve regurgitation is not visualized. Moderate to  severe aortic valve stenosis. Aortic  valve area, by VTI measures 0.91 cm. Aortic valve mean gradient measures  29.0 mmHg.   Patient Profile     70 year old female with history of diabetes, paroxysmal atrial fibrillation, COPD, moderate to severe aortic stenosis, HFpEF presenting with weakness, dizziness and low blood pressures.schedule a colonoscopy yesterday when she suddenly felt weak and dizzy. EKG showed sinus bradycardia with heart rate 42.  Assessment & Plan    A/P: 1.  Dizziness, bradycardia --- Beta-blocker held yesterday, has remained bradycardic with rates in the low 40s for the past 24 hours.  On my rounds, rate 42 even while awake -We will hold amiodarone and continue to hold carvedilol Suspect component of sick sinus syndrome -For hypotension, ARB on hold   2.  Moderate to severe AAS -Has had close monitoring of her aortic valve stenosis Outpatient follow-up   3.  Paroxysmal atrial fibrillation -Continue  Eliquis Amiodarone and carvedilol on hold. -Echo 10/2018 with preserved ejection fraction.  4.  Acute on chronic renal failure Worsening renal function since May 2022 May benefit from renal ultrasound, holding torsemide and ARB  5. COPD: Chronic hypoxic respiratory failure Will have narrow therapeutic window for CHF and respiratory exacerbation   Total encounter time more than 35 minutes  Greater than 50% was spent in counseling  and coordination of care with the patient   For questions or updates, please contact [place] Please consult [email or web address] for contact info under        Signed, Ida Rogue, MD  01/01/2021, 3:58 PM

## 2021-01-01 NOTE — TOC Initial Note (Signed)
Transition of Care [facility]) - Initial/Assessment Note    Patient Details  Name: Kristin [name] MRN: [id number] Date of Birth: [birth year]  Transition of [facility]) CM/SW Contact:    Beverly Sessions, RN Phone Number: 01/01/2021, 9:10 AM  Clinical Narrative:                  Patient with extreme risk for readmission score Patient was assessed by TOC on 8/10 see note below " Pt is agreeable to [facility]. Pt states she has used [facility] in the past and would like to use them again. Pt states she lives with her daughter. Pt's daughter takes pt to doctor appointments. Pt states [facility] [place]. Pt sees a new PCP at [facility]. CSW has reached out to [facility] and and pt is active with RN. CSW asked them to add PT--accepted.               No DME needs.   "   Patient being followed by Cardiac navigator this admission Patient has chronic O2 and portable O2 at home  Expected Discharge Plan: [place] Barriers to Discharge: Continued Medical Work up   Patient Goals and CMS Choice        Expected Discharge Plan and Services Expected Discharge Plan: [place]   Discharge Planning Services: CM Consult   Living arrangements for the past 2 months: Single Family Home                           HH Arranged: RN, PT          Prior Living Arrangements/Services Living arrangements for the past 2 months: Single Family Home Lives with:: Adult Children Patient language and need for interpreter reviewed:: Yes Do you feel safe going back to the place where you live?: Yes      Need for Family Participation in Patient Care: Yes (Comment) Care giver support system in place?: Yes (comment) Current home services: DME Criminal Activity/Legal Involvement Pertinent to Current Situation/Hospitalization: No - Comment as needed  Activities of Daily Living Home Assistive Devices/Equipment: Environmental consultant (specify type) (Four Wheel  Roller) ADL Screening (condition at time of admission) Patient's cognitive ability adequate to safely complete daily activities?: Yes Is the patient deaf or have difficulty hearing?: No Does the patient have difficulty seeing, even when wearing glasses/contacts?: No Does the patient have difficulty concentrating, remembering, or making decisions?: No Patient able to express need for assistance with ADLs?: No Does the patient have difficulty dressing or bathing?: No Independently performs ADLs?: No Communication: Independent Dressing (OT): Needs assistance Is this a change from baseline?: Pre-admission baseline Grooming: Independent Is this a change from baseline?: Pre-admission baseline Feeding: Independent Bathing: Needs assistance Is this a change from baseline?: Pre-admission baseline Toileting: Needs assistance Is this a change from baseline?: Pre-admission baseline In/Out Bed: Needs assistance Is this a change from baseline?: Pre-admission baseline Walks in Home: Needs assistance Is this a change from baseline?: Pre-admission baseline Does the patient have difficulty walking or climbing stairs?: Yes Weakness of Legs: Both Weakness of Arms/Hands: None  Permission Sought/Granted                  Emotional Assessment       Orientation: : Oriented to Self, Oriented to Place, Oriented to  Time, Oriented to Situation Alcohol / Substance Use: Not Applicable Psych Involvement:  No (comment)  Admission diagnosis:  Weakness [R53.1] Hypotension [I95.9] Acute renal failure superimposed on chronic kidney disease, unspecified CKD stage, unspecified acute renal failure type ([place]) [N17.9, N18.9] Opioid use [F11.90] Patient Active Problem List   Diagnosis Date Noted   Weakness    Acute hypoxemic respiratory failure ([place]) 12/23/2020   Acute exacerbation of chronic obstructive pulmonary disease (COPD) ([place]) 12/22/2020   Aspiration into airway    Paroxysmal atrial fibrillation  (HCC)    CAD (coronary artery disease) 09/26/2020   Hyperkalemia 09/26/2020   CHF exacerbation ([place]) 09/26/2020   Acute on chronic respiratory failure with hypoxia and hypercapnia ([place]) 09/25/2020   Bradycardia 09/25/2020   Lung cancer ([place]) 05/26/2020   Atrial flutter with rapid ventricular response ([place]) 05/26/2020   Demand ischemia ([place])    Acute on chronic heart failure with preserved ejection fraction (HFpEF) ([place])    Aortic stenosis-moderate to severe 03/2020    Obesity, Class III, BMI 40-49.9 (morbid obesity) ([place]) 03/29/2020   Chronic respiratory failure with hypoxia ([place]) 03/29/2020   OSA (obstructive sleep apnea) 03/29/2020   Essential hypertension 03/29/2020   Hypothyroidism 03/29/2020   Chest pain 03/29/2020   Elevated troponin 03/29/2020   Chronic diastolic (congestive) heart failure ([place]) 07/12/2018   Type 2 diabetes mellitus without complication ([place]) 02/20/1218   COPD exacerbation ([place]) 07/12/2018   Tobacco use 07/12/2018   Hypotension 07/12/2018   Respiratory failure ([place]) 07/04/2018   Morbid obesity due to excess calories ([place]) 02/08/2016   PCP:  [place] Pharmacy:   [id number] [place], [address] [place] [address] Phone: [phone] Fax: [phone]     Social Determinants of Health (SDOH) Interventions    Readmission Risk Interventions Readmission Risk Prevention Plan 01/01/2021 12/24/2020  Transportation Screening Complete Complete  Medication Review (RN Care Manager) Complete Complete  PCP or Specialist appointment within 3-5 days of discharge - Complete  HRI or [place] Complete Complete  SW Recovery Care/Counseling Consult Complete Complete  [place] - Not Applicable  Some recent data might be hidden

## 2021-01-01 NOTE — Consult Note (Signed)
[facility]  CONSULT NOTE    Date: 01/01/2021                  Patient Name:  Kristin [name]  MRN: [id number]  DOB: [birth year]  Age / Sex: 70 y.o., female         PCP: [facility]                 Service Requesting Consult: Door                 Reason for Consult: Acute kidney injury            History of Present Illness: Kristin [name] is a 70 y.o.  female with past medical history of COPD, GERD, diabetes, and CHF, who was admitted to [facility] on 12/30/2020 for Weakness [R53.1] Hypotension [I95.9] Acute renal failure superimposed on chronic kidney disease, unspecified CKD stage, unspecified acute renal failure type ([place]) [N17.9, N18.9] Opioid use [F11.90]  Patient presented to ED with complaints of dizziness and lightheadedness. She states she was at a gastroenteritis appt and told she was hypotensive and needed to come to ED. We have been consulted to evaluate acute kidney injury. Patient reports no changes in appetite. Currently staying in a skilled nursing facility. Denies nausea, vomiting and diarrhea. Denies increased shortness of breath. Currently wears oxygen 3L at baseline. Denies NSAID use. Takes Torsemide 28m-40mg daily based on weight. Recently admitted for shortness of breath and exacerbation of COPD. Renal function was elevated during that admission. According to patient, she was supposed to follow up with nephrology at discharge, but hasn't had that appt yet. Current labs indicate Creatinine of 2.22, BUN 56, sodium 141, potassium 4.4, and eGFR 24.    Medications: Outpatient medications: Medications Prior to Admission  Medication Sig Dispense Refill Last Dose   amiodarone (PACERONE) 200 MG tablet Take 1 tablet (200 mg total) by mouth daily. 90 tablet 1 12/30/2020 at 0930   apixaban (ELIQUIS) 5 MG TABS tablet Take 1 tablet (5 mg total) by mouth 2 (two) times daily. 60 tablet 11 12/30/2020 at 0930   atorvastatin (LIPITOR) 80 MG tablet Take 1  tablet (80 mg total) by mouth at bedtime. 90 tablet 3 12/29/2020 at 2130   carvedilol (COREG) 3.125 MG tablet Take 1 tablet (3.125 mg total) by mouth 2 (two) times daily. 180 tablet 3 12/30/2020 at 0930   citalopram (CELEXA) 40 MG tablet Take 40 mg by mouth daily.   12/30/2020 at 0930   docusate sodium (COLACE) 100 MG capsule Take 100 mg by mouth at bedtime as needed for mild constipation or moderate constipation.   12/29/2020 at 2130   famotidine (PEPCID) 40 MG tablet Take 40 mg by mouth at bedtime.   12/29/2020 at 2130   fenofibrate 160 MG tablet Take 160 mg by mouth daily.   12/30/2020 at 0930   ferrous sulfate 325 (65 FE) MG tablet Take 325 mg by mouth daily.   12/30/2020 at 0930   gabapentin (NEURONTIN) 600 MG tablet Take 600 mg by mouth 2 (two) times daily.    12/30/2020 at 0930   hydrOXYzine (VISTARIL) 50 MG capsule Take 50 mg by mouth every 6 (six) hours as needed for anxiety.   Past Week at prn   ipratropium-albuterol (DUONEB) 0.5-2.5 (3) MG/3ML SOLN Take 3 mLs by nebulization 4 (four) times daily.   12/30/2020 at 0945   irbesartan (AVAPRO) 300 MG tablet Take 0.5 tablets (150 mg total) by  mouth daily. 90 tablet 3 12/30/2020 at 0930   levothyroxine (SYNTHROID) 75 MCG tablet Take 75 mcg by mouth daily before breakfast.   12/30/2020 at 0900   Melatonin 10 MG TABS Take 10 mg by mouth at bedtime as needed (sleep).   12/29/2020 at 2130   metFORMIN (GLUCOPHAGE) 500 MG tablet Take 500 mg by mouth 2 (two) times daily.   12/30/2020 at 0930   oxyCODONE-acetaminophen (PERCOCET) 10-325 MG tablet Take 1 tablet by mouth 4 (four) times daily as needed for pain.   12/30/2020 at 0930   pantoprazole (PROTONIX) 40 MG tablet Take 40 mg by mouth 2 (two) times daily.   12/30/2020 at 0930   predniSONE (DELTASONE) 10 MG tablet Take 4 tablets (40 mg total) by mouth daily for 3 days, THEN 2 tablets (20 mg total) daily for 3 days, THEN 1 tablet (10 mg total) daily for 3 days. 21 tablet 0 12/30/2020   Tiotropium Bromide Monohydrate  2.5 MCG/ACT AERS Inhale 2 puffs into the lungs daily.    12/29/2020 at 2130   Torsemide 40 MG TABS Take 40 mg by mouth daily. Alternating with 20 mg by mouth every other day 30 tablet  12/30/2020 at 0930   Vitamin D, Ergocalciferol, (DRISDOL) 1.25 MG (50000 UNIT) CAPS capsule Take 50,000 Units by mouth once a week.   12/29/2020   albuterol (VENTOLIN HFA) 108 (90 Base) MCG/ACT inhaler Inhale 2 puffs into the lungs every 4 (four) hours as needed for wheezing or shortness of breath.    unknown at prn   lidocaine (LIDODERM) 5 % Place 3 patches onto the skin daily. (Remove after 12 hours and keep off for 12 hours)   unknown at prn   nicotine (NICODERM CQ - DOSED IN MG/24 HOURS) 21 mg/24hr patch Place 1 patch (21 mg total) onto the skin daily. (Patient not taking: No sig reported) 28 patch 5 Not Taking    Current medications: Current Facility-Administered Medications  Medication Dose Route Frequency Provider Last Rate Last Admin   acetaminophen (TYLENOL) tablet 650 mg  650 mg Oral Q6H PRN Mansy, Jan A, MD       Or   acetaminophen (TYLENOL) suppository 650 mg  650 mg Rectal Q6H PRN Mansy, Jan A, MD       albuterol (PROVENTIL) (2.5 MG/3ML) 0.083% nebulizer solution 3 mL  3 mL Nebulization Q4H PRN Mansy, Jan A, MD       apixaban (ELIQUIS) tablet 5 mg  5 mg Oral BID Mansy, Jan A, MD   5 mg at 01/01/21 0920   atorvastatin (LIPITOR) tablet 80 mg  80 mg Oral QHS Mansy, Jan A, MD   80 mg at 12/31/20 2031   citalopram (CELEXA) tablet 40 mg  40 mg Oral Daily Gwynne Edinger, MD   40 mg at 01/01/21 0920   docusate sodium (COLACE) capsule 100 mg  100 mg Oral QHS PRN Mansy, Jan A, MD       famotidine (PEPCID) tablet 40 mg  40 mg Oral QHS Mansy, Jan A, MD   40 mg at 12/31/20 2031   fenofibrate tablet 160 mg  160 mg Oral Daily Mansy, Jan A, MD   160 mg at 01/01/21 0920   ferrous sulfate tablet 325 mg  325 mg Oral Q breakfast Mansy, Jan A, MD   325 mg at 01/01/21 0921   gabapentin (NEURONTIN) tablet 600 mg  600 mg  Oral BID Mansy, Jan A, MD   600 mg at 01/01/21 [phone]  hydrOXYzine (ATARAX/VISTARIL) tablet 50 mg  50 mg Oral Q6H PRN Mansy, Jan A, MD       ipratropium-albuterol (DUONEB) 0.5-2.5 (3) MG/3ML nebulizer solution 3 mL  3 mL Nebulization QID Mansy, Jan A, MD   3 mL at 01/01/21 1153   levothyroxine (SYNTHROID) tablet 75 mcg  75 mcg Oral QAC breakfast Mansy, Jan A, MD   75 mcg at 01/01/21 0506   lidocaine (LIDODERM) 5 % 3 patch  3 patch Transdermal Q24H Mansy, Jan A, MD   3 patch at 01/01/21 0912   magnesium hydroxide (MILK OF MAGNESIA) suspension 30 mL  30 mL Oral Daily PRN Mansy, Jan A, MD       melatonin tablet 10 mg  10 mg Oral QHS PRN Mansy, Jan A, MD       ondansetron [facility]) tablet 4 mg  4 mg Oral Q6H PRN Mansy, Jan A, MD       Or   ondansetron [facility]) injection 4 mg  4 mg Intravenous Q6H PRN Mansy, Jan A, MD       pantoprazole (PROTONIX) EC tablet 40 mg  40 mg Oral BID Mansy, Jan A, MD   40 mg at 01/01/21 0919   tiotropium [facility]) inhalation capsule ([facility] use ONLY) 18 mcg  18 mcg Inhalation Daily Mansy, Jan A, MD   18 mcg at 01/01/21 2409   traZODone (DESYREL) tablet 25 mg  25 mg Oral QHS PRN Mansy, Jan A, MD   25 mg at 12/31/20 2031   [START ON 01/05/2021] Vitamin D (Ergocalciferol) (DRISDOL) capsule 50,000 Units  50,000 Units Oral Weekly Mansy, Arvella Merles, MD          Allergies: Allergies  Allergen Reactions   Altace [Ramipril] Swelling      Past Medical History: Past Medical History:  Diagnosis Date   Anxiety    Asthma    CHF (congestive heart failure) ([place])    2018   COPD (chronic obstructive pulmonary disease) (HCC)    Diabetes mellitus without complication (HCC)    Dyspnea    GERD (gastroesophageal reflux disease)    History of hiatal hernia    History of orthopnea    History of radiation therapy 07/29/20-08/08/20   Right Lung, SBRT Dr. Gery Pray   Hypothyroidism    Neuropathy    Oxygen deficiency    3L/HS   Pain    BACK/DDD   Peripheral vascular disease (HCC)     RLS (restless legs syndrome)    Sleep apnea    Wheezing      Past Surgical History: Past Surgical History:  Procedure Laterality Date   BREAST SURGERY     CATARACT EXTRACTION W/PHACO Right 03/02/2018   Procedure: CATARACT EXTRACTION PHACO AND INTRAOCULAR LENS PLACEMENT ([place]);  Surgeon: Marchia Meiers, MD;  Location: [facility] ORS;  Service: Ophthalmology;  Laterality: Right;  [place] 01:15 CDE 16.46 Fluid pack lot # [id number]   CATARACT EXTRACTION W/PHACO Left 04/27/2018   Procedure: CATARACT EXTRACTION PHACO AND INTRAOCULAR LENS PLACEMENT ([place])- LEFT DIABETIC;  Surgeon: Marchia Meiers, MD;  Location: [facility] ORS;  Service: Ophthalmology;  Laterality: Left;  Lot # [id number] [place]: 00:46.3 CDE: 8.07    CYST EXCISION     FOREHEAD   FOOT SURGERY     CYST   RIGHT/LEFT HEART CATH AND CORONARY ANGIOGRAPHY N/A 03/31/2020   Procedure: RIGHT/LEFT HEART CATH AND CORONARY ANGIOGRAPHY;  Surgeon: Wellington Hampshire, MD;  Location: [facility];  Service: Cardiovascular;  Laterality: N/A;   TUBAL LIGATION  Family History: Family History  Problem Relation Age of Onset   Heart disease Mother    Cancer Father      Social History: Social History   Socioeconomic History   Marital status: Widowed    Spouse name: Not on file   Number of children: 4   Years of education: Not on file   Highest education level: Not on file  Occupational History   Occupation: Health and safety inspector  Tobacco Use   Smoking status: Former    Packs/day: 1.00    Years: 50.00    Pack years: 50.00    Types: Cigarettes   Smokeless tobacco: Never  Vaping Use   Vaping Use: Never used  Substance and Sexual Activity   Alcohol use: Not Currently   Drug use: Yes    Comment: prescribed oxy   Sexual activity: Not on file  Other Topics Concern   Not on file  Social History Narrative   Not on file   Social Determinants of Health   Financial Resource Strain: Not on file  Food Insecurity: Not on file  Transportation  Needs: Not on file  Physical Activity: Not on file  Stress: Not on file  Social Connections: Not on file  Intimate Partner Violence: Not on file     Review of Systems: Review of Systems  Constitutional:  Positive for malaise/fatigue.  Neurological:  Positive for dizziness.  All other systems reviewed and are negative.  Vital Signs: Blood pressure (!) 114/52, pulse (!) 42, temperature 97.9 F (36.6 C), resp. rate 16, height '5\' 2"'  (1.575 m), weight 107.3 kg, SpO2 97 %.  Weight trends: Filed Weights   12/30/20 1608 01/01/21 0500  Weight: 103.9 kg 107.3 kg    Physical Exam: General: NAD, laying in bed  Head: Normocephalic, atraumatic. Moist oral mucosal membranes  Eyes: Anicteric  Lungs:  Clear to auscultation, O2 3L  Heart: Regular rate and rhythm  Abdomen:  Soft, nontender  Extremities:  no peripheral edema.  Neurologic: Nonfocal, moving all four extremities  Skin: No lesions        Lab results: Basic Metabolic Panel: Recent Labs  Lab 12/30/20 1634 12/31/20 0657 01/01/21 0645  NA 137 141 139  K 4.8 4.4 4.2  CL 94* 96* 95*  CO2 31 33* 34*  GLUCOSE 140* 100* 115*  BUN 67* 56* 59*  CREATININE 2.47* 2.22* 2.49*  CALCIUM 8.6* 8.8* 8.3*    Liver Function Tests: No results for input(s): AST, ALT, ALKPHOS, BILITOT, PROT, ALBUMIN in the last 168 hours. No results for input(s): LIPASE, AMYLASE in the last 168 hours. No results for input(s): AMMONIA in the last 168 hours.  CBC: Recent Labs  Lab 12/30/20 1634 12/31/20 0657  WBC 11.2* 9.0  HGB 10.6* 9.9*  HCT 33.9* 31.7*  MCV 92.4 94.6  PLT 248 226    Cardiac Enzymes: No results for input(s): CKTOTAL, CKMB, CKMBINDEX, TROPONINI in the last 168 hours.  BNP: Invalid input(s): POCBNP  CBG: Recent Labs  Lab 01/01/21 0800 01/01/21 1132  GLUCAP 111* 189*    Microbiology: Results for orders placed or performed during the hospital encounter of 12/30/20  Resp Panel by RT-PCR (Flu A&B, Covid)  Nasopharyngeal Swab     Status: None   Collection Time: 12/30/20  5:28 PM   Specimen: Nasopharyngeal Swab; Nasopharyngeal(NP) swabs in vial transport medium  Result Value Ref Range Status   SARS Coronavirus 2 by RT PCR NEGATIVE NEGATIVE Final    Comment: (NOTE) SARS-CoV-2 target nucleic acids are  NOT DETECTED.  The SARS-CoV-2 RNA is generally detectable in upper respiratory specimens during the acute phase of infection. The lowest concentration of SARS-CoV-2 viral copies this assay can detect is 138 copies/mL. A negative result does not preclude SARS-Cov-2 infection and should not be used as the sole basis for treatment or other patient management decisions. A negative result may occur with  improper specimen collection/handling, submission of specimen other than nasopharyngeal swab, presence of viral mutation(s) within the areas targeted by this assay, and inadequate number of viral copies(<138 copies/mL). A negative result must be combined with clinical observations, patient history, and epidemiological information. The expected result is Negative.  Fact Sheet for Patients:  EntrepreneurPulse.com.au  Fact Sheet for Healthcare Providers:  IncredibleEmployment.be  This test is no t yet approved or cleared by the [place] FDA and  has been authorized for detection and/or diagnosis of SARS-CoV-2 by FDA under an Emergency Use Authorization (EUA). This EUA will remain  in effect (meaning this test can be used) for the duration of the COVID-19 declaration under Section 564(b)(1) of the Act, 21 U.S.C.section 360bbb-3(b)(1), unless the authorization is terminated  or revoked sooner.       Influenza A by PCR NEGATIVE NEGATIVE Final   Influenza B by PCR NEGATIVE NEGATIVE Final    Comment: (NOTE) The Xpert Xpress SARS-CoV-2/FLU/RSV plus assay is intended as an aid in the diagnosis of influenza from Nasopharyngeal swab specimens and should not be  used as a sole basis for treatment. Nasal washings and aspirates are unacceptable for Xpert Xpress SARS-CoV-2/FLU/RSV testing.  Fact Sheet for Patients: EntrepreneurPulse.com.au  Fact Sheet for Healthcare Providers: IncredibleEmployment.be  This test is not yet approved or cleared by the [place] FDA and has been authorized for detection and/or diagnosis of SARS-CoV-2 by FDA under an Emergency Use Authorization (EUA). This EUA will remain in effect (meaning this test can be used) for the duration of the COVID-19 declaration under Section 564(b)(1) of the Act, 21 U.S.C. section 360bbb-3(b)(1), unless the authorization is terminated or revoked.  Performed at [facility], [address] [place], [place] [address]     Coagulation Studies: No results for input(s): LABPROT, INR in the last 72 hours.  Urinalysis: Recent Labs    12/30/20 2017  COLORURINE STRAW*  LABSPEC 1.009  PHURINE 6.0  GLUCOSEU NEGATIVE  HGBUR NEGATIVE  BILIRUBINUR NEGATIVE  KETONESUR NEGATIVE  PROTEINUR NEGATIVE  NITRITE NEGATIVE  LEUKOCYTESUR NEGATIVE      Imaging: DG Chest Port 1 View  Result Date: 12/30/2020 CLINICAL DATA:  Hypotension fatigue EXAM: PORTABLE CHEST 1 VIEW COMPARISON:  12/22/2020, CT 04/08/2020, radiograph 05/29/2020, 09/25/2020 FINDINGS: Cardiomegaly with vascular congestion. Diffuse increased interstitial opacity some of which is felt chronic. No pleural effusion or pneumothorax. Aortic atherosclerosis IMPRESSION: 1. Cardiomegaly with vascular congestion. 2. Diffuse increased interstitial opacity likely due to chronic interstitial changes. No acute superimposed confluent airspace disease Electronically Signed   By: Donavan Foil M.D.   On: 12/30/2020 18:25     Assessment & Plan: Kristin [name] is a 70 y.o.  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 Likely secondary to diuretic use aortic stenosis.  We will defer IVF due to diagnosed aortic stenosis. Encourage oral intake. Ordered renal US to evaluate obstruction. Will hold diuretics at this time. Will monitor labs  2. Anemia of chronic disease  Lab Results  Component Value Date   HGB 9.9 (L) 12/31/2020   Hgb within acceptable range  3. Aortic Stenosis ECHO on 10/24/20 shows EF 55-60% Mild calcification with moderate thickening of aortic valve   LOS: 1   8/18/20222:58 PM

## 2021-01-02 ENCOUNTER — Other Ambulatory Visit: Payer: Self-pay | Admitting: Nurse Practitioner

## 2021-01-02 ENCOUNTER — Telehealth: Payer: Self-pay | Admitting: *Deleted

## 2021-01-02 ENCOUNTER — Ambulatory Visit (HOSPITAL_COMMUNITY): Payer: 59

## 2021-01-02 ENCOUNTER — Inpatient Hospital Stay
Admit: 2021-01-02 | Discharge: 2021-01-02 | Disposition: A | Discharge status: Home / Self Care | Payer: 59 | Attending: Nurse Practitioner | Admitting: Nurse Practitioner

## 2021-01-02 DIAGNOSIS — N189 Chronic kidney disease, unspecified: Secondary | ICD-10-CM

## 2021-01-02 DIAGNOSIS — N179 Acute kidney failure, unspecified: Secondary | ICD-10-CM

## 2021-01-02 DIAGNOSIS — I959 Hypotension, unspecified: Secondary | ICD-10-CM | POA: Diagnosis not present

## 2021-01-02 DIAGNOSIS — I495 Sick sinus syndrome: Secondary | ICD-10-CM

## 2021-01-02 DIAGNOSIS — R531 Weakness: Secondary | ICD-10-CM | POA: Diagnosis not present

## 2021-01-02 DIAGNOSIS — I498 Other specified cardiac arrhythmias: Secondary | ICD-10-CM

## 2021-01-02 DIAGNOSIS — R001 Bradycardia, unspecified: Secondary | ICD-10-CM | POA: Diagnosis not present

## 2021-01-02 LAB — BASIC METABOLIC PANEL
Anion gap: 8 (ref 5–15)
BUN: 56 mg/dL — ABNORMAL HIGH (ref 8–23)
CO2: 33 mmol/L — ABNORMAL HIGH (ref 22–32)
Calcium: 8.4 mg/dL — ABNORMAL LOW (ref 8.9–10.3)
Chloride: 99 mmol/L (ref 98–111)
Creatinine, Ser: 1.85 mg/dL — ABNORMAL HIGH (ref 0.44–1.00)
GFR, Estimated: 29 mL/min — ABNORMAL LOW (ref >60)
Glucose, Bld: 102 mg/dL — ABNORMAL HIGH (ref 70–99)
Potassium: 4.4 mmol/L (ref 3.5–5.1)
Sodium: 140 mmol/L (ref 135–145)

## 2021-01-02 MED ORDER — TORSEMIDE 20 MG PO TABS: 20.0000 mg | ORAL_TABLET | Freq: Every day | ORAL | 1 refills | Status: DC

## 2021-01-02 MED ORDER — IRBESARTAN 75 MG PO TABS: 75.0000 mg | ORAL_TABLET | Freq: Every day | ORAL | 3 refills | Status: AC

## 2021-01-02 MED ORDER — IRBESARTAN 75 MG PO TABS: 75.0000 mg | ORAL_TABLET | Freq: Every day | ORAL | 3 refills | Status: DC

## 2021-01-02 NOTE — Progress Notes (Signed)
Patient discharged from facility via wheelchair, accompanied by daughter.  Patient noted to have her portable O2 tank. Patient discharged with all pertinent information, personal belongings, and prescriptions.  Patient able to verbalize understanding and teach back discharge instructions. IV site d/ced. No acute distress noted. Care relinquished.

## 2021-01-02 NOTE — Progress Notes (Signed)
[facility]  ROUNDING NOTE   Subjective:   Kristin [name]  is a 70 y.o.  female with past medical history of COPD, GERD, diabetes, and CHF, who was admitted to [facility] on 12/30/2020 for Weakness [R53.1] Hypotension [I95.9] Acute renal failure superimposed on chronic kidney disease, unspecified CKD stage, unspecified acute renal failure type ([place]) [N17.9, N18.9] Opioid use [F11.90]  Patient seen sitting up in chair Alert and oriented Tolerating meals and maintaining nutrition States she feels better today   Objective:  Vital signs in last 24 hours:  Temp:  [98 F (36.7 C)-98.8 F (37.1 C)] 98 F (36.7 C) (08/19 0809) Pulse Rate:  [40-53] 45 (08/19 1138) Resp:  [16-20] 20 (08/19 1138) BP: (102-130)/(41-61) 130/41 (08/19 0809) SpO2:  [96 %-100 %] 96 % (08/19 1138) Weight:  [107.6 kg] 107.6 kg (08/19 0500)  Weight change: 0.3 kg Filed Weights   12/30/20 1608 01/01/21 0500 01/02/21 0500  Weight: 103.9 kg 107.3 kg 107.6 kg    Intake/Output: I/O last 3 completed shifts: In: 24 [P.O.:960] Out: 850 [Urine:850]   Intake/Output this shift:  Total I/O In: 240 [P.O.:240] Out: -   Physical Exam: General: NAD, sitting in bed  Head: Normocephalic, atraumatic. Moist oral mucosal membranes  Eyes: Anicteric  Lungs:  Clear to auscultation, normal effort, O2 3L  Heart: Regular rate and rhythm  Abdomen:  Soft, nontender  Extremities:  no peripheral edema.  Neurologic: Nonfocal, moving all four extremities  Skin: No lesions       Basic Metabolic Panel: Recent Labs  Lab 12/30/20 1634 12/31/20 0657 01/01/21 0645 01/02/21 0504  NA 137 141 139 140  K 4.8 4.4 4.2 4.4  CL 94* 96* 95* 99  CO2 31 33* 34* 33*  GLUCOSE 140* 100* 115* 102*  BUN 67* 56* 59* 56*  CREATININE 2.47* 2.22* 2.49* 1.85*  CALCIUM 8.6* 8.8* 8.3* 8.4*    Liver Function Tests: No results for input(s): AST, ALT, ALKPHOS, BILITOT, PROT, ALBUMIN in the last 168 hours. No results for input(s):  LIPASE, AMYLASE in the last 168 hours. No results for input(s): AMMONIA in the last 168 hours.  CBC: Recent Labs  Lab 12/30/20 1634 12/31/20 0657  WBC 11.2* 9.0  HGB 10.6* 9.9*  HCT 33.9* 31.7*  MCV 92.4 94.6  PLT 248 226    Cardiac Enzymes: No results for input(s): CKTOTAL, CKMB, CKMBINDEX, TROPONINI in the last 168 hours.  BNP: Invalid input(s): POCBNP  CBG: Recent Labs  Lab 01/01/21 0800 01/01/21 1132 01/01/21 1601  GLUCAP 111* 189* 157*    Microbiology: Results for orders placed or performed during the hospital encounter of 12/30/20  Resp Panel by RT-PCR (Flu A&B, Covid) Nasopharyngeal Swab     Status: None   Collection Time: 12/30/20  5:28 PM   Specimen: Nasopharyngeal Swab; Nasopharyngeal(NP) swabs in vial transport medium  Result Value Ref Range Status   SARS Coronavirus 2 by RT PCR NEGATIVE NEGATIVE Final    Comment: (NOTE) SARS-CoV-2 target nucleic acids are NOT DETECTED.  The SARS-CoV-2 RNA is generally detectable in upper respiratory specimens during the acute phase of infection. The lowest concentration of SARS-CoV-2 viral copies this assay can detect is 138 copies/mL. A negative result does not preclude SARS-Cov-2 infection and should not be used as the sole basis for treatment or other patient management decisions. A negative result may occur with  improper specimen collection/handling, submission of specimen other than nasopharyngeal swab, presence of viral mutation(s) within the areas targeted by this assay,  and inadequate number of viral copies(<138 copies/mL). A negative result must be combined with clinical observations, patient history, and epidemiological information. The expected result is Negative.  Fact Sheet for Patients:  EntrepreneurPulse.com.au  Fact Sheet for Healthcare Providers:  IncredibleEmployment.be  This test is no t yet approved or cleared by the [place] FDA and  has been  authorized for detection and/or diagnosis of SARS-CoV-2 by FDA under an Emergency Use Authorization (EUA). This EUA will remain  in effect (meaning this test can be used) for the duration of the COVID-19 declaration under Section 564(b)(1) of the Act, 21 U.S.C.section 360bbb-3(b)(1), unless the authorization is terminated  or revoked sooner.       Influenza A by PCR NEGATIVE NEGATIVE Final   Influenza B by PCR NEGATIVE NEGATIVE Final    Comment: (NOTE) The Xpert Xpress SARS-CoV-2/FLU/RSV plus assay is intended as an aid in the diagnosis of influenza from Nasopharyngeal swab specimens and should not be used as a sole basis for treatment. Nasal washings and aspirates are unacceptable for Xpert Xpress SARS-CoV-2/FLU/RSV testing.  Fact Sheet for Patients: EntrepreneurPulse.com.au  Fact Sheet for Healthcare Providers: IncredibleEmployment.be  This test is not yet approved or cleared by the [place] FDA and has been authorized for detection and/or diagnosis of SARS-CoV-2 by FDA under an Emergency Use Authorization (EUA). This EUA will remain in effect (meaning this test can be used) for the duration of the COVID-19 declaration under Section 564(b)(1) of the Act, 21 U.S.C. section 360bbb-3(b)(1), unless the authorization is terminated or revoked.  Performed at [facility], [address] [place], [place] [address]     Coagulation Studies: No results for input(s): LABPROT, INR in the last 72 hours.  Urinalysis: Recent Labs    12/30/20 2017  COLORURINE STRAW*  LABSPEC 1.009  PHURINE 6.0  GLUCOSEU NEGATIVE  HGBUR NEGATIVE  BILIRUBINUR NEGATIVE  KETONESUR NEGATIVE  PROTEINUR NEGATIVE  NITRITE NEGATIVE  LEUKOCYTESUR NEGATIVE      Imaging: US RENAL  Result Date: 01/01/2021 CLINICAL DATA:  Acute kidney injury EXAM: RENAL / URINARY TRACT ULTRASOUND COMPLETE COMPARISON:  CT 04/08/2020 FINDINGS: Right Kidney: Renal  measurements: 9.8 x 5 x 5.2 cm = volume: 134 mL. Echogenicity within normal limits. No mass or hydronephrosis visualized. Left Kidney: Renal measurements: 10.8 x 5.9 x 4.8 cm = volume: 159 mL. Echogenicity within normal limits. No hydronephrosis. Upper pole cyst measuring 4.6 x 3.8 x 3.7 cm. Bladder: Appears normal for degree of bladder distention. Other: None. IMPRESSION: 1. Negative for hydronephrosis. 2. Simple cyst in the left kidney. Electronically Signed   By: Donavan Foil M.D.   On: 01/01/2021 21:49     Medications:     apixaban  5 mg Oral BID   atorvastatin  80 mg Oral QHS   citalopram  40 mg Oral Daily   famotidine  40 mg Oral QHS   fenofibrate  160 mg Oral Daily   ferrous sulfate  325 mg Oral Q breakfast   gabapentin  600 mg Oral BID   ipratropium-albuterol  3 mL Nebulization QID   levothyroxine  75 mcg Oral QAC breakfast   lidocaine  3 patch Transdermal Q24H   pantoprazole  40 mg Oral BID   tiotropium  18 mcg Inhalation Daily   [START ON 01/05/2021] Vitamin D (Ergocalciferol)  50,000 Units Oral Weekly   acetaminophen **OR** acetaminophen, albuterol, docusate sodium, hydrOXYzine, magnesium hydroxide, melatonin, ondansetron **OR** ondansetron (ZOFRAN) IV, traZODone  Assessment/ Plan:  Ms. Camani Sesay Ontko is a  70 y.o.  female  is a 70 y.o.  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 Likely secondary to diuretic use with aortic stenosis. Renal ultrasound shows simple cyst on left kidney and negative for obstruction. Creatinine improved with oral hydration. Will need to follow up with nephrology at discharge.   Lab  Results  Component Value Date   CREATININE 1.85 (H) 01/02/2021   CREATININE 2.49 (H) 01/01/2021   CREATININE 2.22 (H) 12/31/2020    Intake/Output Summary (Last 24 hours) at 01/02/2021 1144 Last data filed at 01/02/2021 1018 Gross per 24 hour  Intake 960 ml  Output 850 ml  Net 110 ml   2. Anemia of chronic kidney disease Lab Results  Component Value Date   HGB 9.9 (L) 12/31/2020    Hgb at goal  3. Aortic Stenosis ECHO 10/24/20 indicates EF 55-60% with moderate thickening and mild calcification of the aortic valve.    LOS: 2   8/19/202211:44 AM

## 2021-01-02 NOTE — Progress Notes (Signed)
Progress Note  Patient Name: Kristin [name] Date of Encounter: 01/02/2021  [facility] Cardiologist: Ida Rogue, MD   Subjective   Feels well, no complaints, sitting up in a chair Denies shortness of breath,  Torsemide held yesterday with improvement of renal function Telemetry reviewed in detail, Asymptomatic bradycardia rates in the 50s at rest This morning getting up going to the bathroom junctional rhythm rate 55 bpm, short run atrial fibrillation rate 80 Telemetry at that point came off, put back on as she was sitting in recliner several minutes later, back in normal sinus rhythm Asymptomatic during this time  Inpatient Medications    Scheduled Meds:  apixaban  5 mg Oral BID   atorvastatin  80 mg Oral QHS   citalopram  40 mg Oral Daily   famotidine  40 mg Oral QHS   fenofibrate  160 mg Oral Daily   ferrous sulfate  325 mg Oral Q breakfast   gabapentin  600 mg Oral BID   ipratropium-albuterol  3 mL Nebulization QID   levothyroxine  75 mcg Oral QAC breakfast   lidocaine  3 patch Transdermal Q24H   pantoprazole  40 mg Oral BID   tiotropium  18 mcg Inhalation Daily   [START ON 01/05/2021] Vitamin D (Ergocalciferol)  50,000 Units Oral Weekly   Continuous Infusions:  PRN Meds: acetaminophen **OR** acetaminophen, albuterol, docusate sodium, hydrOXYzine, magnesium hydroxide, melatonin, ondansetron **OR** ondansetron (ZOFRAN) IV, traZODone   Vital Signs    Vitals:   01/02/21 0342 01/02/21 0500 01/02/21 0809 01/02/21 0833  BP: (!) 110/53  (!) 130/41   Pulse: (!) 42  (!) 40 (!) 53  Resp: 20  20 20   Temp: 98.2 F (36.8 C)  98 F (36.7 C)   TempSrc: Oral  Oral   SpO2: 100%  100% 97%  Weight:  107.6 kg    Height:        Intake/Output Summary (Last 24 hours) at 01/02/2021 1014 Last data filed at 01/02/2021 0347 Gross per 24 hour  Intake 720 ml  Output 850 ml  Net -130 ml   Last 3 Weights 01/02/2021 01/01/2021 12/30/2020  Weight (lbs) 237 lb 3.4 oz 236 lb 8.9  oz 229 lb  Weight (kg) 107.6 kg 107.3 kg 103.874 kg      Telemetry    Sinus bradycardia rate 55- Personally Reviewed  ECG    - Personally Reviewed  Physical Exam   Constitutional:  oriented to person, place, and time. No distress.  Obese HENT:  Head: Grossly normal Eyes:  no discharge. No scleral icterus.  Neck: No JVD, no carotid bruits  Cardiovascular: Regular rate and rhythm, no murmurs appreciated Pulmonary/Chest: Decreased breath sounds bilaterally, scattered Rales Abdominal: Soft.  no distension.  no tenderness.  Musculoskeletal: Normal range of motion Neurological:  normal muscle tone. Coordination normal. No atrophy Skin: Skin warm and dry Psychiatric: normal affect, pleasant   Labs    High Sensitivity Troponin:   Recent Labs  Lab 12/22/20 1218 12/24/20 2005 12/24/20 2227 12/30/20 2017 12/30/20 2218  TROPONINIHS 26* 22* 24* 10 10      Chemistry Recent Labs  Lab 12/31/20 0657 01/01/21 0645 01/02/21 0504  NA 141 139 140  K 4.4 4.2 4.4  CL 96* 95* 99  CO2 33* 34* 33*  GLUCOSE 100* 115* 102*  BUN 56* 59* 56*  CREATININE 2.22* 2.49* 1.85*  CALCIUM 8.8* 8.3* 8.4*  GFRNONAA 24* 21* 29*  ANIONGAP 12 10 8      Hematology Recent  Labs  Lab 12/30/20 1634 12/31/20 0657  WBC 11.2* 9.0  RBC 3.67* 3.35*  HGB 10.6* 9.9*  HCT 33.9* 31.7*  MCV 92.4 94.6  MCH 28.9 29.6  MCHC 31.3 31.2  RDW 15.1 15.2  PLT 248 226    BNP Recent Labs  Lab 12/30/20 2017  BNP 204.5*     DDimer No results for input(s): DDIMER in the last 168 hours.   Radiology    US RENAL  Result Date: 01/01/2021 CLINICAL DATA:  Acute kidney injury EXAM: RENAL / URINARY TRACT ULTRASOUND COMPLETE COMPARISON:  CT 04/08/2020 FINDINGS: Right Kidney: Renal measurements: 9.8 x 5 x 5.2 cm = volume: 134 mL. Echogenicity within normal limits. No mass or hydronephrosis visualized. Left Kidney: Renal measurements: 10.8 x 5.9 x 4.8 cm = volume: 159 mL. Echogenicity within normal limits. No  hydronephrosis. Upper pole cyst measuring 4.6 x 3.8 x 3.7 cm. Bladder: Appears normal for degree of bladder distention. Other: None. IMPRESSION: 1. Negative for hydronephrosis. 2. Simple cyst in the left kidney. Electronically Signed   By: Donavan Foil M.D.   On: 01/01/2021 21:49    Cardiac Studies   Echocardiogram October 24, 2020  1. Left ventricular ejection fraction, by estimation, is 55 to 60%. The  left ventricle has normal function. The left ventricle has no regional  wall motion abnormalities. There is moderate left ventricular hypertrophy.  Left ventricular diastolic function   could not be evaluated.   2. Pulmonary artery pressure is at least upper normal to mildly elevated  (RVSP 30 mmHg plus central venous pressure). Right ventricular systolic  function is normal. The right ventricular size is mildly enlarged.   3. Left atrial size was mildly dilated.   4. Right atrial size was mildly dilated.   5. The mitral valve is degenerative. No evidence of mitral valve  regurgitation. No evidence of mitral stenosis. Moderate to severe mitral  annular calcification.   6. The aortic valve has an indeterminant number of cusps. There is mild  calcification of the aortic valve. There is moderate thickening of the  aortic valve. Aortic valve regurgitation is not visualized. Moderate to  severe aortic valve stenosis. Aortic  valve area, by VTI measures 0.91 cm. Aortic valve mean gradient measures  29.0 mmHg.   Patient Profile     70 year old female with history of diabetes, paroxysmal atrial fibrillation, COPD, moderate to severe aortic stenosis, HFpEF presenting with weakness, dizziness and low blood pressures.schedule a colonoscopy yesterday when she suddenly felt weak and dizzy. EKG showed sinus bradycardia with heart rate 42.  Assessment & Plan    A/P: 1.  Dizziness, bradycardia This admission beta-blocker held and amiodarone held for sick sinus syndrome, symptomatic  bradycardia -Heart rate now in the 50s, with good chronotropic competence Junctional rhythm rate 55 noted, asymptomatic Short run atrial fibrillation this morning rate 80 bpm asymptomatic, back to normal sinus rhythm -No plan to restart beta-blocker or amiodarone at this time -We will place a ZIO at the time of discharge   2.  Moderate to severe aortic valve stenosis Close monitoring of aortic valve stenosis as outpatient   3.  Paroxysmal atrial fibrillation -Continue  Eliquis Amiodarone and carvedilol on hold. -Echo 10/2018 with preserved ejection fraction.  4.  Acute on chronic renal failure Worsening renal function since May 2022 Improved overnight by holding ARB and torsemide With decrease torsemide down to 20 mg daily, hold on Sundays Consider irbesartan 75 down from 150  5. COPD: Chronic hypoxic respiratory failure  Will have narrow therapeutic window for CHF and respiratory exacerbation On oxygen  Case discussed with Hospitalist service  Total encounter time more than 35 minutes  Greater than 50% was spent in counseling and coordination of care with the patient   For questions or updates, please contact [place] Please consult [email or web address] for contact info under        Signed, Ida Rogue, MD  01/02/2021, 10:14 AM

## 2021-01-02 NOTE — TOC Transition Note (Signed)
Transition of Care [facility]) - CM/SW Discharge Note   Patient Details  Name: Kristin [name] MRN: [id number] Date of Birth: November 15, 1950  Transition of Care [facility]) CM/SW Contact:  Beverly Sessions, RN Phone Number: 01/02/2021, 10:54 AM   Clinical Narrative:     Patient will DC HF:GBMS Anticipated DC date: 01/02/21  [facility] notified of discharge Family to transport  [place] signing off.  Isaias Cowman [facility] [phone]   Final next level of care: Home w Home Health Services Barriers to Discharge: Barriers Resolved   Patient Goals and CMS Choice        Discharge Placement                       Discharge Plan and Services   Discharge Planning Services: CM Consult                      HH Arranged: RN, PT [facility] Agency: [place] Date [facility] Agency Contacted: 01/02/21   Representative spoke with at [place]: [place] Determinants of Health (SDOH) Interventions     Readmission Risk Interventions Readmission Risk Prevention Plan 01/02/2021 01/01/2021 12/24/2020  Transportation Screening Complete Complete Complete  Medication Review [employer] photographer) Complete Complete Complete  PCP or Specialist appointment within 3-5 days of discharge Complete - Complete  HRI or Home Care Consult Complete Complete Complete  SW Recovery Care/Counseling Consult Complete Complete Complete  Palliative Care Screening Not Applicable - Not [place] Not Applicable - Not Applicable  Some recent data might be hidden

## 2021-01-02 NOTE — Discharge Summary (Addendum)
Kristin [name] KXF:[id number] DOB: [birth year] DOA: 12/30/2020  PCP: [facility]  Admit date: 12/30/2020 Discharge date: 01/02/2021  Time spent: 35 minutes  Recommendations for Outpatient Follow-up:  Cardiology f/u Check of kidney function at f/u, consider nephrology referral if gfr persistently below 30     Discharge Diagnoses:  Active Problems:   Hypotension   Chronic respiratory failure with hypoxia (HCC)   Aortic stenosis-moderate to severe 03/2020   PAF (paroxysmal atrial fibrillation) (HCC)   Junctional rhythm   Acute on chronic renal failure (HCC)   Tachycardia-bradycardia syndrome ([place])   Discharge Condition: stable  Diet recommendation: low sodium heart healthy  Filed Weights   12/30/20 1608 01/01/21 0500 01/02/21 0500  Weight: 103.9 kg 107.3 kg 107.6 kg    History of present illness:  Kristin [name] is a 70 y.o. female with medical history significant for asthma, CHF, COPD, type diabetes mellitus, GERD, hypothyroidism and peripheral neuropathy as well as peripheral vascular disease and sleep apnea, who presented to the ER with acute onset of generalized weakness with associated dizziness and lightheadedness and mild headache today.  She was seen in the gastroenterology clinic today when she was found to be hypotensive and referred to the ER.  She is not sure how much beta-blocker dose with Coreg she is taken today.  She denies any worsening orthopnea or dyspnea on exertion.  She admitted to lower extremity edema without worsening.  She admitted to chills without measured fever.  No dysuria, oliguria or hematuria or flank pain.  She denied any cough or wheezing or dyspnea or palpitations or chest pain.  She had 2 recent intermittent weight gain.  Hospital Course:  # Generalized weakness # Possible sick sinus syndrome Presented from  GI clinic for acute onset weakness and hypotension. Here initially hypotensive, HR in 50s. Hypotension resolved, patient feels as though  she is back to baseline. She does not think she mis-took meds. BB and amiodarone may be contributory. Plan for ziopatch at discharge but pt reports reaction to adhesive. Cardiology evaluated, will defer ziopatch for now. - carvedilol and amiodarone held - close outpt cardiology f/u   # HFpEF # Severe aortic stenosis Appears compensated. 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 Renal u/s unremarkable - torsemide and irbesartan dose reduction - bmp at f/u   # COPD # Chronic hypoxic respiratory failure Without exacerbation, stable on home O2   # Lung cancer Followed by oncology in [place]. Is s/p radiation - close outpt f/u  Procedures: none   Consultations: cardiology  Discharge Exam: Vitals:   01/02/21 0809 01/02/21 0833  BP: (!) 130/41   Pulse: (!) 40 (!) 53  Resp: 20 20  Temp: 98 F (36.7 C)   SpO2: 100% 97%    General exam: Appears calm and comfortable  Respiratory system: scattered exp wheeze faint Cardiovascular system: S1 & S2 heard, RR, harsh systolic murmur Gastrointestinal system: Abdomen is obese, soft and nontender. No organomegaly or masses felt. Normal bowel sounds heard. Central nervous system: Alert and oriented. No focal neurological deficits. Extremities: Symmetric 5 x 5 power. Skin: No rashes, lesions or ulcers Psychiatry: Judgement and insight appear normal. Mood & affect appropriate.  Discharge Instructions   Discharge Instructions     Diet - low sodium heart healthy   Complete by: As directed    Increase activity slowly   Complete by: As directed       Allergies as of 01/02/2021       Reactions   Altace [ramipril] Swelling        Medication List      STOP taking these medications    amiodarone 200 MG tablet Commonly known as: PACERONE   carvedilol 3.125 MG tablet Commonly known as: COREG   predniSONE 10 MG tablet Commonly known as: DELTASONE       TAKE these medications    albuterol 108 (90 Base) MCG/ACT inhaler Commonly known as: VENTOLIN HFA Inhale 2 puffs into the lungs every 4 (four) hours as needed for wheezing or shortness of breath.   apixaban 5 MG Tabs tablet Commonly known as: ELIQUIS Take 1 tablet (5 mg total) by mouth 2 (two) times daily.   atorvastatin 80 MG tablet Commonly known as: LIPITOR Take 1 tablet (80 mg total) by mouth at bedtime.   citalopram 40 MG tablet Commonly known as: CELEXA Take 40 mg by mouth daily.   docusate sodium 100 MG capsule Commonly known as: COLACE Take 100 mg by mouth at bedtime as needed for mild constipation or moderate constipation.   famotidine 40 MG tablet Commonly known as: PEPCID Take 40 mg by mouth at bedtime.   fenofibrate 160 MG tablet Take 160 mg by mouth daily.   ferrous sulfate 325 (65 FE) MG tablet Take 325 mg by mouth daily.   gabapentin 600 MG tablet Commonly known as: NEURONTIN Take 600 mg by mouth 2 (two) times daily.   hydrOXYzine 50 MG capsule Commonly known as: VISTARIL Take 50 mg by mouth every 6 (six) hours as needed for anxiety.   ipratropium-albuterol 0.5-2.5 (3) MG/3ML Soln Commonly known as: DUONEB Take 3 mLs by nebulization 4 (four) times daily.   irbesartan 75 MG tablet Commonly known as: Avapro Take 1 tablet (75 mg total) by mouth daily. What changed:  medication strength how much to take   levothyroxine 75 MCG tablet Commonly known as: SYNTHROID Take 75 mcg by mouth daily before breakfast.   lidocaine 5 % Commonly known as: LIDODERM Place 3 patches onto the skin daily. (Remove after 12 hours and keep off for 12 hours)   Melatonin 10 MG Tabs Take 10 mg by mouth at bedtime as needed (sleep).   metFORMIN 500 MG  tablet Commonly known as: GLUCOPHAGE Take 500 mg by mouth 2 (two) times daily.   nicotine 21 mg/24hr patch Commonly known as: NICODERM CQ - dosed in mg/24 hours Place 1 patch (21 mg total) onto the skin daily.   oxyCODONE-acetaminophen 10-325 MG tablet Commonly known as: PERCOCET Take 1 tablet by mouth 4 (four) times daily as needed for pain.   pantoprazole 40 MG tablet Commonly known as: PROTONIX Take 40 mg by mouth 2 (two) times daily.   Tiotropium Bromide Monohydrate 2.5 MCG/ACT Aers Inhale 2 puffs into the lungs daily.   torsemide 20 MG tablet Commonly known as: DEMADEX Take 1 tablet (20 mg total) by mouth daily. Alternating with 20 mg by mouth every other day What changed:  medication strength how much to take   Vitamin D (Ergocalciferol) 1.25 MG (50000 UNIT) Caps capsule Commonly known as: DRISDOL Take 50,000 Units by mouth once a week.       Allergies  Allergen Reactions  Altace [Ramipril] Swelling    Follow-up Information     Gollan, Kathlene November, MD. Schedule an appointment as soon as possible for a visit.   Specialty: Cardiology Contact information: [address] [place] [address] [phone]                  The results of significant diagnostics from this hospitalization (including imaging, microbiology, ancillary and laboratory) are listed below for reference.    Significant Diagnostic Studies: US RENAL  Result Date: 01/01/2021 CLINICAL DATA:  Acute kidney injury EXAM: RENAL / URINARY TRACT ULTRASOUND COMPLETE COMPARISON:  CT 04/08/2020 FINDINGS: Right Kidney: Renal measurements: 9.8 x 5 x 5.2 cm = volume: 134 mL. Echogenicity within normal limits. No mass or hydronephrosis visualized. Left Kidney: Renal measurements: 10.8 x 5.9 x 4.8 cm = volume: 159 mL. Echogenicity within normal limits. No hydronephrosis. Upper pole cyst measuring 4.6 x 3.8 x 3.7 cm. Bladder: Appears normal for degree of bladder distention. Other: None.  IMPRESSION: 1. Negative for hydronephrosis. 2. Simple cyst in the left kidney. Electronically Signed   By: Donavan Foil M.D.   On: 01/01/2021 21:49   DG Chest Port 1 View  Result Date: 12/30/2020 CLINICAL DATA:  Hypotension fatigue EXAM: PORTABLE CHEST 1 VIEW COMPARISON:  12/22/2020, CT 04/08/2020, radiograph 05/29/2020, 09/25/2020 FINDINGS: Cardiomegaly with vascular congestion. Diffuse increased interstitial opacity some of which is felt chronic. No pleural effusion or pneumothorax. Aortic atherosclerosis IMPRESSION: 1. Cardiomegaly with vascular congestion. 2. Diffuse increased interstitial opacity likely due to chronic interstitial changes. No acute superimposed confluent airspace disease Electronically Signed   By: Donavan Foil M.D.   On: 12/30/2020 18:25   DG Chest Portable 1 View  Result Date: 12/22/2020 CLINICAL DATA:  Respiratory distress, COPD EXAM: PORTABLE CHEST 1 VIEW COMPARISON:  10/29/2020 FINDINGS: Chronic interstitial changes with suspected superimposed edema. No significant pleural effusion. Stable cardiomediastinal contours. No pneumothorax. IMPRESSION: Chronic interstitial changes with suspected superimposed edema. Electronically Signed   By: Macy Mis M.D.   On: 12/22/2020 10:43    Microbiology: Recent Results (from the past 240 hour(s))  Resp Panel by RT-PCR (Flu A&B, Covid) Nasopharyngeal Swab     Status: None   Collection Time: 12/30/20  5:28 PM   Specimen: Nasopharyngeal Swab; Nasopharyngeal(NP) swabs in vial transport medium  Result Value Ref Range Status   SARS Coronavirus 2 by RT PCR NEGATIVE NEGATIVE Final    Comment: (NOTE) SARS-CoV-2 target nucleic acids are NOT DETECTED.  The SARS-CoV-2 RNA is generally detectable in upper respiratory specimens during the acute phase of infection. The lowest concentration of SARS-CoV-2 viral copies this assay can detect is 138 copies/mL. A negative result does not preclude SARS-Cov-2 infection and should not be used as  the sole basis for treatment or other patient management decisions. A negative result may occur with  improper specimen collection/handling, submission of specimen other than nasopharyngeal swab, presence of viral mutation(s) within the areas targeted by this assay, and inadequate number of viral copies(<138 copies/mL). A negative result must be combined with clinical observations, patient history, and epidemiological information. The expected result is Negative.  Fact Sheet for Patients:  EntrepreneurPulse.com.au  Fact Sheet for Healthcare Providers:  IncredibleEmployment.be  This test is no t yet approved or cleared by the [place] FDA and  has been authorized for detection and/or diagnosis of SARS-CoV-2 by FDA under an Emergency Use Authorization (EUA). This EUA will remain  in effect (meaning this test can be used) for the duration  of the COVID-19 declaration under Section 564(b)(1) of the Act, 21 U.S.C.section 360bbb-3(b)(1), unless the authorization is terminated  or revoked sooner.       Influenza A by PCR NEGATIVE NEGATIVE Final   Influenza B by PCR NEGATIVE NEGATIVE Final    Comment: (NOTE) The Xpert Xpress SARS-CoV-2/FLU/RSV plus assay is intended as an aid in the diagnosis of influenza from Nasopharyngeal swab specimens and should not be used as a sole basis for treatment. Nasal washings and aspirates are unacceptable for Xpert Xpress SARS-CoV-2/FLU/RSV testing.  Fact Sheet for Patients: EntrepreneurPulse.com.au  Fact Sheet for Healthcare Providers: IncredibleEmployment.be  This test is not yet approved or cleared by the [place] FDA and has been authorized for detection and/or diagnosis of SARS-CoV-2 by FDA under an Emergency Use Authorization (EUA). This EUA will remain in effect (meaning this test can be used) for the duration of the COVID-19 declaration under Section 564(b)(1) of  the Act, 21 U.S.C. section 360bbb-3(b)(1), unless the authorization is terminated or revoked.  Performed at [facility], [address] [place], [place] [address]      Labs: Basic Metabolic Panel: Recent Labs  Lab 12/30/20 1634 12/31/20 0657 01/01/21 0645 01/02/21 0504  NA 137 141 139 140  K 4.8 4.4 4.2 4.4  CL 94* 96* 95* 99  CO2 31 33* 34* 33*  GLUCOSE 140* 100* 115* 102*  BUN 67* 56* 59* 56*  CREATININE 2.47* 2.22* 2.49* 1.85*  CALCIUM 8.6* 8.8* 8.3* 8.4*   Liver Function Tests: No results for input(s): AST, ALT, ALKPHOS, BILITOT, PROT, ALBUMIN in the last 168 hours. No results for input(s): LIPASE, AMYLASE in the last 168 hours. No results for input(s): AMMONIA in the last 168 hours. CBC: Recent Labs  Lab 12/30/20 1634 12/31/20 0657  WBC 11.2* 9.0  HGB 10.6* 9.9*  HCT 33.9* 31.7*  MCV 92.4 94.6  PLT 248 226   Cardiac Enzymes: No results for input(s): CKTOTAL, CKMB, CKMBINDEX, TROPONINI in the last 168 hours. BNP: BNP (last 3 results) Recent Labs    10/29/20 1334 12/22/20 1016 12/30/20 2017  BNP 326.3* 647.6* 204.5*    ProBNP (last 3 results) No results for input(s): PROBNP in the last 8760 hours.  CBG: Recent Labs  Lab 01/01/21 0800 01/01/21 1132 01/01/21 1601  GLUCAP 111* 189* 157*       Signed:  Desma Maxim MD.  [facility] 01/02/2021, 10:35 AM

## 2021-01-02 NOTE — Progress Notes (Signed)
    Notified by resp tech that pt reported prior severe skin rxn to zio patch.  Discussed with Dr. Rockey Situ.  As other event monitoring devices are likely to result in a similar skin reaction, and with stable rhythm, will forego event monitoring at this time.  Murray Hodgkins, NP

## 2021-01-02 NOTE — Telephone Encounter (Signed)
CALLED PATIENT'S DAUGHTER - JUDY MASHBURN TO INFORM THAT I RECEIVED HER MESSAGE AND BOTH APPTS. HAVE BEEN CANCELLED AND I AM WAITING ON A RETURN CALL AS TO WHEN IT CAN BE RESCHEDULED

## 2021-01-02 NOTE — Care Management Important Message (Signed)
Important Message  Patient Details  Name: Kristin [name] MRN: [id number] Date of Birth: 03-31-1951   Medicare Important Message Given:  Yes     Dannette Barbara 01/02/2021, 11:59 AM

## 2021-01-04 NOTE — Progress Notes (Deleted)
Patient ID: Kristin [name], female    DOB: December 13, 1950, 70 y.o.   MRN: [id number]  HPI  Kristin [name] is a 70 y/o female with a history of asthma, DM, thyroid disease, COPD, obstructive sleep apnea, GERD, anxiety, PVD, current tobacco use and chronic heart failure.   Echo report from 10/24/20 reviewed and showed an EF of 55-60% along with moderate LVH, mildly elevated PA pressure and moderate aortic thickening. Echo report from 07/05/2018 reviewed and showed an EF of 60-65% along with moderate AS.   RHC/LHC done 03/31/20 and showed: Prox RCA lesion is 30% stenosed. Mid RCA lesion is 40% stenosed. RPDA lesion is 30% stenosed. Prox Cx lesion is 60% stenosed. Mid LAD lesion is 20% stenosed. Mid Cx to Dist Cx lesion is 20% stenosed.  1.  Mild to moderate nonobstructive coronary artery disease.  Worst stenosis is 60% in the proximal left circumflex.  No evidence of obstructive disease.  Coronary arteries are overall moderately calcified. 2.  Right heart catheterization showed moderately elevated left-sided filling pressures, moderate pulmonary hypertension and moderately reduced cardiac output. 3.  Severe aortic stenosis with mean gradient of 31 mmHg and calculated valve area of 0.7 cm.  Admitted 12/30/20 due to generalized weakness and dizziness and found to be hypotensive. Cardiology and nephrology consults obtained. Medications adjusted. Discharged after 3 days. Admitted 12/22/20 due to acute on chronic heart failure. Initially placed on bipap and given IV lasix. Given IV steroids and bronchodilators. Elevated troponin thought to be due to demand ischemia. Weaned off bipap to her 3L. Discharged after 3 days.   She presents today for a follow-up visit although hasn't been seen since 2020. She presents today with a chief complaint of  Past Medical History:  Diagnosis Date   Anxiety    Asthma    CHF (congestive heart failure) ([place])    2018   COPD (chronic obstructive pulmonary disease) ([place])     Diabetes mellitus without complication (HCC)    Dyspnea    GERD (gastroesophageal reflux disease)    History of hiatal hernia    History of orthopnea    History of radiation therapy 07/29/20-08/08/20   Right Lung, SBRT Dr. Gery Pray   Hypothyroidism    Neuropathy    Oxygen deficiency    3L/HS   Pain    BACK/DDD   Peripheral vascular disease (HCC)    RLS (restless legs syndrome)    Sleep apnea    Wheezing    Past Surgical History:  Procedure Laterality Date   BREAST SURGERY     CATARACT EXTRACTION W/PHACO Right 03/02/2018   Procedure: CATARACT EXTRACTION PHACO AND INTRAOCULAR LENS PLACEMENT ([place]);  Surgeon: Marchia Meiers, MD;  Location: [facility] ORS;  Service: Ophthalmology;  Laterality: Right;  [place] 01:15 CDE 16.46 Fluid pack lot # [id number]   CATARACT EXTRACTION W/PHACO Left 04/27/2018   Procedure: CATARACT EXTRACTION PHACO AND INTRAOCULAR LENS PLACEMENT ([place])- LEFT DIABETIC;  Surgeon: Marchia Meiers, MD;  Location: [facility] ORS;  Service: Ophthalmology;  Laterality: Left;  Lot # [id number] H [place]: 00:46.3 CDE: 8.07    CYST EXCISION     FOREHEAD   FOOT SURGERY     CYST   RIGHT/LEFT HEART CATH AND CORONARY ANGIOGRAPHY N/A 03/31/2020   Procedure: RIGHT/LEFT HEART CATH AND CORONARY ANGIOGRAPHY;  Surgeon: Wellington Hampshire, MD;  Location: [facility];  Service: Cardiovascular;  Laterality: N/A;   TUBAL LIGATION     Family History  Problem Relation Age of Onset   Heart disease  Mother    Cancer Father    Social History   Tobacco Use   Smoking status: Former    Packs/day: 1.00    Years: 50.00    Pack years: 50.00    Types: Cigarettes   Smokeless tobacco: Never  Substance Use Topics   Alcohol use: Not Currently   Allergies  Allergen Reactions   Altace [Ramipril] Swelling     Review of Systems  Constitutional:  Positive for fatigue (with minimal exertion). Negative for appetite change.  HENT:  Negative for congestion, rhinorrhea and sore throat.   Eyes: Negative.    Respiratory:  Positive for shortness of breath (with minimal exertion). Negative for chest tightness.   Cardiovascular:  Negative for chest pain, palpitations and leg swelling.  Gastrointestinal:  Negative for abdominal distention and abdominal pain.  Endocrine: Negative.   Genitourinary: Negative.   Musculoskeletal:  Positive for back pain. Negative for neck pain.  Skin: Negative.   Allergic/Immunologic: Negative.   Neurological:  Positive for light-headedness. Negative for dizziness.  Hematological:  Negative for adenopathy. Does not bruise/bleed easily.  Psychiatric/Behavioral:  Positive for sleep disturbance (trouble falling asleep). Negative for dysphoric mood. The patient is not nervous/anxious.      Physical Exam Vitals and nursing note reviewed.  Constitutional:      Appearance: Normal appearance.  HENT:     Head: Normocephalic and atraumatic.  Cardiovascular:     Rate and Rhythm: Normal rate and regular rhythm.  Pulmonary:     Effort: Pulmonary effort is normal.     Breath sounds: Rhonchi present. No wheezing or rales.  Abdominal:     General: There is no distension.     Palpations: Abdomen is soft.  Musculoskeletal:        General: No tenderness.     Cervical back: Normal range of motion and neck supple.     Right lower leg: No edema.     Left lower leg: No edema.  Skin:    General: Skin is warm and dry.  Neurological:     General: No focal deficit present.     Mental Status: She is alert and oriented to person, place, and time.  Psychiatric:        Mood and Affect: Mood normal.        Behavior: Behavior normal.    Assessment & Plan:  1: Chronic heart failure with preserved ejection fraction with structural changes (LVH)- - NYHA class III - euvolemic today - weighing daily and she was reminded to call for an overnight weight gain of >2 pounds or a weekly weight gain of >5 pounds - she is not adding salt and is using Mrs Deliah Boston for seasoning. Daughter does the  cooking and she has been reading food labels. Reviewed the importance of closely following a 2000mg  sodium diet  - saw cardiology Rockey Situ) 11/03/20 - BNP 12/30/20 was 204.5  2: Aortic valve stenosis - saw surgery Julianne Handler) 12/11/20  3: DM- - saw PCP at [facility] on  - BMP 01/02/21 reviewed and showed sodium 140, potassium 4.4, creatinine 1.85 and GFR 29 - A1c 12/23/20 was 5.4%  4: COPD- - wearing oxygen at 2L around the clock - encouraged her to speak with PCP about pulmonology referral  5: Tobacco use- - smoking 1/2 ppd of cigarettes daily - cessation discussed for 3 minutes with her    Medication list was reviewed.

## 2021-01-05 ENCOUNTER — Ambulatory Visit: Payer: Self-pay | Admitting: Radiation Oncology

## 2021-01-05 ENCOUNTER — Telehealth: Payer: Self-pay | Admitting: *Deleted

## 2021-01-05 NOTE — Telephone Encounter (Signed)
Returned patient's daughter's phone call, spoke with patient's daughter- Kristin [name]

## 2021-01-05 NOTE — Telephone Encounter (Signed)
CALLED PATIENT'S DAUGHTER- JUDY MASHBURN TO INFORM OF CT FOR 01-14-21- ARRIVAL TIME- 10:45 AM @ [facility], NO RESTRICTIONS TO TEST, PATIENT TO RECEIVE RESULTS FROM DR. KINARD ON 01-22-21 @ 9:15 AM, SPOKE WITH PATIENT'S DAUGHTER- JUDY MASHBURN AND SHE IS AWARE OF THESE APPTS.

## 2021-01-06 ENCOUNTER — Telehealth: Payer: Self-pay | Admitting: Family

## 2021-01-06 ENCOUNTER — Ambulatory Visit: Payer: 59 | Admitting: Family

## 2021-01-06 NOTE — Telephone Encounter (Signed)
Patient did not show for her Heart Failure Clinic appointment on 01/06/21. Will attempt to reschedule.

## 2021-01-07 ENCOUNTER — Ambulatory Visit (INDEPENDENT_AMBULATORY_CARE_PROVIDER_SITE_OTHER): Payer: 59

## 2021-01-07 ENCOUNTER — Encounter: Payer: Self-pay | Admitting: Nurse Practitioner

## 2021-01-07 ENCOUNTER — Ambulatory Visit (INDEPENDENT_AMBULATORY_CARE_PROVIDER_SITE_OTHER): Payer: 59 | Admitting: Nurse Practitioner

## 2021-01-07 ENCOUNTER — Other Ambulatory Visit: Payer: Self-pay

## 2021-01-07 VITALS — BP 120/50 | HR 58 | Ht 62.0 in | Wt 234.0 lb

## 2021-01-07 DIAGNOSIS — R001 Bradycardia, unspecified: Secondary | ICD-10-CM

## 2021-01-07 DIAGNOSIS — E782 Mixed hyperlipidemia: Secondary | ICD-10-CM

## 2021-01-07 DIAGNOSIS — I480 Paroxysmal atrial fibrillation: Secondary | ICD-10-CM | POA: Diagnosis not present

## 2021-01-07 DIAGNOSIS — I952 Hypotension due to drugs: Secondary | ICD-10-CM | POA: Diagnosis not present

## 2021-01-07 DIAGNOSIS — R55 Syncope and collapse: Secondary | ICD-10-CM | POA: Diagnosis not present

## 2021-01-07 DIAGNOSIS — I350 Nonrheumatic aortic (valve) stenosis: Secondary | ICD-10-CM

## 2021-01-07 DIAGNOSIS — I5032 Chronic diastolic (congestive) heart failure: Secondary | ICD-10-CM

## 2021-01-07 DIAGNOSIS — I10 Essential (primary) hypertension: Secondary | ICD-10-CM

## 2021-01-07 DIAGNOSIS — Z720 Tobacco use: Secondary | ICD-10-CM

## 2021-01-07 NOTE — Patient Instructions (Addendum)
Medication Instructions:  - Your physician recommends that you continue on your current medications as directed. Please refer to the Current Medication list given to you today.  *If you need a refill on your cardiac medications before your next appointment, please call your pharmacy*   Lab Work: - none ordered  If you have labs (blood work) drawn today and your tests are completely normal, you will receive your results only by: [place] (if you have MyChart) OR A paper copy in the mail If you have any lab test that is abnormal or we need to change your treatment, we will call you to review the results.   Testing/Procedures:  1) 14 day ZIO AT monitor (placed in office today)   Dates of wear: 01/07/21-01/21/21  - Your physician has recommended that you wear a Zio monitor.   This monitor is a medical device that records the heart's electrical activity. Doctors most often use these monitors to diagnose arrhythmias. Arrhythmias are problems with the speed or rhythm of the heartbeat. The monitor is a small device applied to your chest. You can wear one while you do your normal daily activities. While wearing this monitor if you have any symptoms to push the button and record what you felt. Once you have worn this monitor for the period of time provider prescribed (Usually 14 days), you will return the monitor device in the postage paid box. Once it is returned they will download the data collected and provide [place] with a report which the provider will then review and we will call you with those results. Important tips:  Avoid showering during the first 24 hours of wearing the monitor. Avoid excessive sweating to help maximize wear time. Do not submerge the device, no hot tubs, and no swimming pools. Keep any lotions or oils away from the patch. After 24 hours you may shower with the patch on. Take brief showers with your back facing the shower head.  Do not remove patch once it has been  placed because that will interrupt data and decrease adhesive wear time. Push the button when you have any symptoms and write down what you were feeling. Once you have completed wearing your monitor, remove and place into box which has postage paid and place in your outgoing mailbox.  If for some reason you have misplaced your box then call our office and we can provide another box and/or mail it off for you.      Follow-Up: At [facility], you and your health needs are our priority.  As part of our continuing mission to provide you with exceptional heart care, we have created designated Provider Care Teams.  These Care Teams include your primary Cardiologist (physician) and Advanced Practice Providers (APPs -  Physician Assistants and Nurse Practitioners) who all work together to provide you with the care you need, when you need it.  We recommend signing up for the patient portal called "MyChart".  Sign up information is provided on this After Visit Summary.  MyChart is used to connect with patients for Virtual Visits (Telemedicine).  Patients are able to view lab/test results, encounter notes, upcoming appointments, etc.  Non-urgent messages can be sent to your provider as well.   To learn more about what you can do with MyChart, go to [email or web address].    Your next appointment:   2 month(s)  The format for your next appointment:   In Person  Provider:   You may see Ida Rogue, MD or  one of the following Advanced Practice Providers on your designated Care Team:   Murray Hodgkins, NP    Other Instructions N/a

## 2021-01-07 NOTE — Progress Notes (Signed)
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  Past Medical History    Past Medical History:  Diagnosis Date   (HFpEF) heart failure with preserved ejection fraction ([place])    a. 09/2020 Echo: EF >55%; b. 10/2020 Echo: EF 55-60%. RVSP 23mmHg.   Anxiety    Asthma    CAD (coronary artery disease)    a. 03/2020 Cath: LM nl, LAD 20, LCX 60p, 21m/d, RCA 30p, 67m, RPDA 30-->Med Rx.   COPD (chronic obstructive pulmonary disease) (HCC)    Diabetes mellitus without complication (HCC)    Dyspnea    GERD (gastroesophageal reflux disease)    History of hiatal hernia    History of orthopnea    History of radiation therapy 07/29/20-08/08/20   Right Lung, SBRT Dr. Gery Pray   Hypothyroidism    Moderate aortic stenosis    a. 03/2020 Echo: Mod-sev AS. AVA 1.11cm^2. Mean grad 72mmHg; b. 10/2020 Echo: EF 55-60%, no rwma, nl RV fxn, RVSP 1mmHg, mild BAE, mod-sev mitral annular Ca2+ w/o MS/MR. Mod-sev AS. AVA 0.91cm^2 (VTI), mean grad 33mmHg.   Neuropathy    Oxygen deficiency    3L/HS   PAF (paroxysmal atrial fibrillation) ([place])    a. Post-termination pauses. Prev on amio.  CHA2DS2VASc 6-->eliqis.   Pain    BACK/DDD   Peripheral vascular disease (HCC)    RLS (restless legs syndrome)    Sleep apnea    Wheezing    Past Surgical History:  Procedure Laterality Date   BREAST SURGERY     CATARACT EXTRACTION W/PHACO  Right 03/02/2018   Procedure: CATARACT EXTRACTION PHACO AND INTRAOCULAR LENS PLACEMENT ([place]);  Surgeon: Marchia Meiers, MD;  Location: [facility] ORS;  Service: Ophthalmology;  Laterality: Right;  [place] 01:15 CDE 16.46 Fluid pack lot # [id number]   CATARACT EXTRACTION W/PHACO Left 04/27/2018   Procedure: CATARACT EXTRACTION PHACO AND INTRAOCULAR LENS PLACEMENT ([place])- LEFT DIABETIC;  Surgeon: Marchia Meiers, MD;  Location: [facility] ORS;  Service: Ophthalmology;  Laterality: Left;  Lot # [id number] [place]: 00:46.3 CDE: 8.07    CYST EXCISION     FOREHEAD   FOOT SURGERY     CYST   RIGHT/LEFT HEART CATH AND CORONARY ANGIOGRAPHY N/A 03/31/2020   Procedure: RIGHT/LEFT HEART CATH AND CORONARY ANGIOGRAPHY;  Surgeon: Wellington Hampshire, MD;  Location: [place] [facility];  Service: Cardiovascular;  Laterality: N/A;   TUBAL LIGATION      Allergies  Allergies  Allergen Reactions   Altace [Ramipril] Swelling    History of Present Illness    70 year old female with the above complex past medical history including HFpEF, moderate to severe aortic stenosis, paroxysmal atrial fibrillation with posttermination pauses, hypertension, chronic respiratory failure on home O2, tobacco abuse, COPD, sleep apnea, hypothyroidism, type 2 diabetes mellitus, anxiety, GERD, and 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  She was admitted to [facility] November 2021 with chest pain and mild troponin elevation.  Echocardiogram showed normal LV function but moderate to severe aortic  stenosis with a mean gradient of 31 mmHg.  Diagnostic catheterization revealed moderate, diffuse, nonobstructive CAD and medical therapy was recommended.  She was referred to structural heart clinic and underwent further work-up for TAVR, which showed anatomy suitable for 26 mm Edwards SAPIEN 3 valve.  CT of the abdomen incidentally showed a right lower lobe mass with biopsy showing squamous cell carcinoma.  TAVR work-up was delayed.  She underwent  radiation therapy.  In January of this year, she was admitted with atrial flutter and rapid ventricular response.  Subsequent monitoring showed short runs of atrial fibrillation.  She was placed on Eliquis and amiodarone.  Follow-up echocardiogram in June of this year showed an EF of 55 to 60%, moderate LVH, and moderate to severe aortic stenosis with a mean gradient of 29 mmHg, peak gradient of 48 mmHg, and an aortic valve area of 0.85 cm (Vmax).  [name] has continued to smoke and has had admissions for COPD.  She was seen in structural heart clinic in July and was felt to be overall stable.  She is not felt to be a surgical candidate for aortic stenosis given comorbidities but is felt to be a TAVR candidate pending progression of disease.  Plan at that time was for follow-up echo in 6 months.  Unfortunately, [name] was admitted to [facility] last week after presenting with hypotension and pressures in the 70s.  In the ED, EKG showed sinus bradycardia at 42 bpm.  Beta-blocker and amiodarone were held and both heart rates and blood pressures improved.  Diuretic therapy was also held in the setting of acute kidney injury with a creatinine up to 2.49.  She was noted to have short runs of atrial fibrillation with heart rates in the 80s during admission.  No further significant bradycardia was noted and she was discharged home off of amiodarone and beta-blocker.  Plan was for a 14-day Zio monitor at discharge however, this was unable to be placed secondary to reported prior intolerance to the patch/adhesive.  Since discharge, she has felt well.  She is chronic dyspnea on exertion and wears 3 L of oxygen 24 hours a day.  She continues to smoke (removes oxygen for this).  She denies chest pain, dyspnea, palpitations, PND, orthopnea, dizziness, syncope, edema, or early satiety.  In discussing the Zio monitor today, she says that she actually tolerates the Zio patch its just that she did not tolerate the tape  that they put over it last time.  I confirmed with our staff, that there is no need to put tape over it and so she is willing to try it again.  Home Medications    Current Outpatient Medications  Medication Sig Dispense Refill   albuterol (VENTOLIN HFA) 108 (90 Base) MCG/ACT inhaler Inhale 2 puffs into the lungs every 4 (four) hours as needed for wheezing or shortness of breath.      apixaban (ELIQUIS) 5 MG TABS tablet Take 1 tablet (5 mg total) by mouth 2 (two) times daily. 60 tablet 11   atorvastatin (LIPITOR) 80 MG tablet Take 1 tablet (80 mg total) by mouth at bedtime. 90 tablet 3   citalopram (CELEXA) 40 MG tablet Take 40 mg by mouth daily.     docusate sodium (COLACE) 100 MG capsule Take 100 mg by mouth at bedtime as needed for mild constipation or moderate constipation.     famotidine (PEPCID) 40 MG tablet Take 40 mg by mouth at bedtime.     fenofibrate 160  MG tablet Take 160 mg by mouth daily.     ferrous sulfate 325 (65 FE) MG tablet Take 325 mg by mouth daily.     gabapentin (NEURONTIN) 600 MG tablet Take 600 mg by mouth 2 (two) times daily.      hydrOXYzine (VISTARIL) 50 MG capsule Take 50 mg by mouth every 6 (six) hours as needed for anxiety.     ipratropium-albuterol (DUONEB) 0.5-2.5 (3) MG/3ML SOLN Take 3 mLs by nebulization 4 (four) times daily.     irbesartan (AVAPRO) 75 MG tablet Take 1 tablet (75 mg total) by mouth daily. 90 tablet 3   levothyroxine (SYNTHROID) 75 MCG tablet Take 75 mcg by mouth daily before breakfast.     lidocaine (LIDODERM) 5 % Place 3 patches onto the skin daily. (Remove after 12 hours and keep off for 12 hours)     Melatonin 10 MG TABS Take 10 mg by mouth at bedtime as needed (sleep).     metFORMIN (GLUCOPHAGE) 500 MG tablet Take 500 mg by mouth 2 (two) times daily.     nicotine (NICODERM CQ - DOSED IN MG/24 HOURS) 21 mg/24hr patch Place 1 patch (21 mg total) onto the skin daily. 28 patch 5   oxyCODONE-acetaminophen (PERCOCET) 10-325 MG tablet Take 1  tablet by mouth 4 (four) times daily as needed for pain.     pantoprazole (PROTONIX) 40 MG tablet Take 40 mg by mouth 2 (two) times daily.     Tiotropium Bromide Monohydrate 2.5 MCG/ACT AERS Inhale 2 puffs into the lungs daily.      torsemide (DEMADEX) 20 MG tablet Take 1 tablet (20 mg total) by mouth daily. Alternating with 20 mg by mouth every other day 90 tablet 1   Vitamin D, Ergocalciferol, (DRISDOL) 1.25 MG (50000 UNIT) CAPS capsule Take 50,000 Units by mouth once a week.     No current facility-administered medications for this visit.     Review of Systems    Chronic dyspnea on exertion which has been stable.  She denies chest pain, palpitations, PND, orthopnea, dizziness, syncope, edema, or early satiety.  All other systems reviewed and are otherwise negative except as noted above.  Physical Exam    VS:  BP (!) 120/50 (BP Location: Right Arm, Patient Position: Sitting, Cuff Size: Normal)   Pulse (!) 58   Ht 5\' 2"  (1.575 m)   Wt 234 lb (106.1 kg)   LMP  (LMP Unknown)   SpO2 93%   BMI 42.80 kg/m  , BMI Body mass index is 42.8 kg/m.     GEN: Obese, in no acute distress. HEENT: normal. Neck: Supple, no JVD, carotid bruits, or masses. Cardiac: RRR, 3/6 systolic ejection murmur loudest at the upper sternal borders, no rubs, or gallops. No clubbing, cyanosis, edema.  Radials 2+/PT 1+ and equal bilaterally.  Respiratory:  Respirations regular and unlabored, coarse breath sounds bilaterally. GI: Obese, soft, nontender, nondistended, BS + x 4. MS: no deformity or atrophy. Skin: warm and dry, no rash. Neuro:  Strength and sensation are intact. Psych: Normal affect.  Accessory Clinical Findings    ECG personally reviewed by me today -sinus bradycardia/sinus rhythm, 58, PACs - no acute changes.  Lab Results  Component Value Date   WBC 9.0 12/31/2020   HGB 9.9 (L) 12/31/2020   HCT 31.7 (L) 12/31/2020   MCV 94.6 12/31/2020   PLT 226 12/31/2020   Lab Results  Component Value  Date   CREATININE 1.85 (H) 01/02/2021   BUN 56 (  H) 01/02/2021   NA 140 01/02/2021   K 4.4 01/02/2021   CL 99 01/02/2021   CO2 33 (H) 01/02/2021   Lab Results  Component Value Date   ALT 17 12/22/2020   AST 24 12/22/2020   ALKPHOS 36 (L) 12/22/2020   BILITOT 0.6 12/22/2020   Lab Results  Component Value Date   CHOL 165 06/27/2020   HDL 61 06/27/2020   LDLCALC 91 06/27/2020   TRIG 64 06/27/2020   CHOLHDL 2.7 06/27/2020    Lab Results  Component Value Date   HGBA1C 5.4 12/23/2020    Assessment & Plan    1.  Presyncope, bradycardia, and hypotension: Patient recently admitted with hypotension, and bradycardia with associated presyncope.  Beta-blocker and amiodarone therapy were discontinued.  She was noted to have brief episodes of atrial fibrillation on monitoring but no sustained pauses or profound bradycardia.  She was discharged home with plan for outpatient monitoring though this did not occur because she initially reported an intolerance to the adhesive.  In discussing it today, she actually tolerated adhesive in the past just not the tape that was placed over the device.  I discussed this with our nursing staff.  We do not need to place any tape over the device and therefore patient is willing to have Zio placed today.  We will continue to look for any prolonged pauses or profound bradycardia as well as recurrences of atrial fibrillation that might necessitate resumption of antiarrhythmic therapy.  2.  Moderate to severe aortic stenosis: This was stable on echo in June.  She is followed in structural heart clinic with plan for repeat echo in December/January.  3.  Chronic heart failure with preserved ejection fraction: She is euvolemic on examination today.  Hemodynamically stable.  She is back on torsemide.  4.  Essential hypertension: Stable on irbesartan and torsemide therapy.  5.  Paroxysmal atrial fibrillation: Maintaining sinus rhythm off of beta-blocker and amiodarone.   Zio monitoring as above to assess for recurrent A. fib and/or posttermination pauses.  She is anticoagulated with Eliquis.  6.  Hyperlipidemia: Remains on statin therapy with an LDL of 91 in February 2022.  7.  COPD/tobacco abuse: She is on supplemental oxygen around-the-clock with the exception of times that she takes it off to smoke cigarettes.  She has coarse breath sounds on exam.  Complete smoking cessation advised.  Inhaler therapy per medicine.  8.  Disposition: Follow-up Zio monitoring as outlined above.  Follow-up in clinic in 6 to 8 weeks or sooner if necessary.  Murray Hodgkins, NP 01/07/2021, 5:26 PM

## 2021-01-12 ENCOUNTER — Ambulatory Visit: Payer: 59 | Admitting: Radiation Oncology

## 2021-01-14 ENCOUNTER — Other Ambulatory Visit: Payer: Self-pay

## 2021-01-14 ENCOUNTER — Ambulatory Visit (HOSPITAL_COMMUNITY)
Admission: RE | Admit: 2021-01-14 | Discharge: 2021-01-14 | Disposition: A | Discharge status: Home / Self Care | Payer: 59 | Source: Ambulatory Visit | Attending: Radiation Oncology | Admitting: Radiation Oncology

## 2021-01-14 DIAGNOSIS — C3491 Malignant neoplasm of unspecified part of right bronchus or lung: Secondary | ICD-10-CM

## 2021-01-17 ENCOUNTER — Inpatient Hospital Stay
Admission: EM | Admit: 2021-01-17 | Discharge: 2021-01-21 | DRG: 189 | Disposition: A | Discharge status: Home Health | Payer: Medicare Other | Attending: Internal Medicine | Admitting: Internal Medicine

## 2021-01-17 ENCOUNTER — Emergency Department: Payer: Medicare Other

## 2021-01-17 ENCOUNTER — Other Ambulatory Visit: Payer: Self-pay

## 2021-01-17 DIAGNOSIS — G4733 Obstructive sleep apnea (adult) (pediatric): Secondary | ICD-10-CM | POA: Diagnosis present

## 2021-01-17 DIAGNOSIS — J9601 Acute respiratory failure with hypoxia: Secondary | ICD-10-CM | POA: Diagnosis not present

## 2021-01-17 DIAGNOSIS — Z6841 Body Mass Index (BMI) 40.0 and over, adult: Secondary | ICD-10-CM | POA: Diagnosis not present

## 2021-01-17 DIAGNOSIS — E119 Type 2 diabetes mellitus without complications: Secondary | ICD-10-CM | POA: Diagnosis not present

## 2021-01-17 DIAGNOSIS — C3411 Malignant neoplasm of upper lobe, right bronchus or lung: Secondary | ICD-10-CM | POA: Diagnosis present

## 2021-01-17 DIAGNOSIS — J441 Chronic obstructive pulmonary disease with (acute) exacerbation: Secondary | ICD-10-CM | POA: Diagnosis not present

## 2021-01-17 DIAGNOSIS — J9621 Acute and chronic respiratory failure with hypoxia: Principal | ICD-10-CM | POA: Diagnosis present

## 2021-01-17 DIAGNOSIS — I350 Nonrheumatic aortic (valve) stenosis: Secondary | ICD-10-CM | POA: Diagnosis present

## 2021-01-17 DIAGNOSIS — I10 Essential (primary) hypertension: Secondary | ICD-10-CM | POA: Diagnosis not present

## 2021-01-17 DIAGNOSIS — E1122 Type 2 diabetes mellitus with diabetic chronic kidney disease: Secondary | ICD-10-CM | POA: Diagnosis present

## 2021-01-17 DIAGNOSIS — E039 Hypothyroidism, unspecified: Secondary | ICD-10-CM | POA: Diagnosis present

## 2021-01-17 DIAGNOSIS — Z7984 Long term (current) use of oral hypoglycemic drugs: Secondary | ICD-10-CM

## 2021-01-17 DIAGNOSIS — Z79890 Hormone replacement therapy (postmenopausal): Secondary | ICD-10-CM | POA: Diagnosis not present

## 2021-01-17 DIAGNOSIS — J9620 Acute and chronic respiratory failure, unspecified whether with hypoxia or hypercapnia: Secondary | ICD-10-CM | POA: Diagnosis present

## 2021-01-17 DIAGNOSIS — Z20822 Contact with and (suspected) exposure to covid-19: Secondary | ICD-10-CM | POA: Diagnosis present

## 2021-01-17 DIAGNOSIS — J9622 Acute and chronic respiratory failure with hypercapnia: Secondary | ICD-10-CM

## 2021-01-17 DIAGNOSIS — C3490 Malignant neoplasm of unspecified part of unspecified bronchus or lung: Secondary | ICD-10-CM | POA: Diagnosis present

## 2021-01-17 DIAGNOSIS — I2510 Atherosclerotic heart disease of native coronary artery without angina pectoris: Secondary | ICD-10-CM | POA: Diagnosis present

## 2021-01-17 DIAGNOSIS — E1140 Type 2 diabetes mellitus with diabetic neuropathy, unspecified: Secondary | ICD-10-CM | POA: Diagnosis present

## 2021-01-17 DIAGNOSIS — R0602 Shortness of breath: Secondary | ICD-10-CM | POA: Diagnosis present

## 2021-01-17 DIAGNOSIS — K219 Gastro-esophageal reflux disease without esophagitis: Secondary | ICD-10-CM | POA: Diagnosis present

## 2021-01-17 DIAGNOSIS — Z888 Allergy status to other drugs, medicaments and biological substances status: Secondary | ICD-10-CM

## 2021-01-17 DIAGNOSIS — E1151 Type 2 diabetes mellitus with diabetic peripheral angiopathy without gangrene: Secondary | ICD-10-CM | POA: Diagnosis present

## 2021-01-17 DIAGNOSIS — Z9851 Tubal ligation status: Secondary | ICD-10-CM | POA: Diagnosis not present

## 2021-01-17 DIAGNOSIS — I5033 Acute on chronic diastolic (congestive) heart failure: Secondary | ICD-10-CM | POA: Diagnosis not present

## 2021-01-17 DIAGNOSIS — N1832 Chronic kidney disease, stage 3b: Secondary | ICD-10-CM | POA: Diagnosis present

## 2021-01-17 DIAGNOSIS — J9602 Acute respiratory failure with hypercapnia: Secondary | ICD-10-CM | POA: Diagnosis not present

## 2021-01-17 DIAGNOSIS — G2581 Restless legs syndrome: Secondary | ICD-10-CM | POA: Diagnosis present

## 2021-01-17 DIAGNOSIS — I480 Paroxysmal atrial fibrillation: Secondary | ICD-10-CM | POA: Diagnosis present

## 2021-01-17 DIAGNOSIS — I130 Hypertensive heart and chronic kidney disease with heart failure and stage 1 through stage 4 chronic kidney disease, or unspecified chronic kidney disease: Secondary | ICD-10-CM | POA: Diagnosis present

## 2021-01-17 DIAGNOSIS — Z79899 Other long term (current) drug therapy: Secondary | ICD-10-CM

## 2021-01-17 DIAGNOSIS — Z7901 Long term (current) use of anticoagulants: Secondary | ICD-10-CM

## 2021-01-17 DIAGNOSIS — Z923 Personal history of irradiation: Secondary | ICD-10-CM

## 2021-01-17 DIAGNOSIS — Z8249 Family history of ischemic heart disease and other diseases of the circulatory system: Secondary | ICD-10-CM

## 2021-01-17 LAB — BLOOD GAS, ARTERIAL
Acid-Base Excess: 6.2 mmol/L — ABNORMAL HIGH (ref 0.0–2.0)
Acid-Base Excess: 5.5 mmol/L — ABNORMAL HIGH (ref 0.0–2.0)
Bicarbonate: 37.1 mmol/L — ABNORMAL HIGH (ref 20.0–28.0)
Bicarbonate: 33.5 mmol/L — ABNORMAL HIGH (ref 20.0–28.0)
Delivery systems: POSITIVE
Delivery systems: POSITIVE
Expiratory PAP: 10
Expiratory PAP: 10
FIO2: 0.1
FIO2: 0.3
Inspiratory PAP: 20
Inspiratory PAP: 20
O2 Saturation: 99.9 %
O2 Saturation: 89.2 %
Patient temperature: 37
Patient temperature: 37
RATE: 12 resp/min
pCO2 arterial: 95 mmHg (ref 32.0–48.0)
pCO2 arterial: 68 mmHg (ref 32.0–48.0)
pH, Arterial: 7.2 — ABNORMAL LOW (ref 7.350–7.450)
pH, Arterial: 7.3 — ABNORMAL LOW (ref 7.350–7.450)
pO2, Arterial: 337 mmHg — ABNORMAL HIGH (ref 83.0–108.0)
pO2, Arterial: 63 mmHg — ABNORMAL LOW (ref 83.0–108.0)

## 2021-01-17 LAB — CBC WITH DIFFERENTIAL/PLATELET
Abs Immature Granulocytes: 0.08 10*3/uL — ABNORMAL HIGH (ref 0.00–0.07)
Basophils Absolute: 0.1 10*3/uL (ref 0.0–0.1)
Basophils Relative: 1 %
Eosinophils Absolute: 0.7 10*3/uL — ABNORMAL HIGH (ref 0.0–0.5)
Eosinophils Relative: 6 %
HCT: 39.8 % (ref 36.0–46.0)
Hemoglobin: 12 g/dL (ref 12.0–15.0)
Immature Granulocytes: 1 %
Lymphocytes Relative: 23 %
Lymphs Abs: 2.6 10*3/uL (ref 0.7–4.0)
MCH: 29.1 pg (ref 26.0–34.0)
MCHC: 30.2 g/dL (ref 30.0–36.0)
MCV: 96.6 fL (ref 80.0–100.0)
Monocytes Absolute: 1 10*3/uL (ref 0.1–1.0)
Monocytes Relative: 9 %
Neutro Abs: 7 10*3/uL (ref 1.7–7.7)
Neutrophils Relative %: 60 %
Platelets: 342 10*3/uL (ref 150–400)
RBC: 4.12 MIL/uL (ref 3.87–5.11)
RDW: 15 % (ref 11.5–15.5)
WBC: 11.4 10*3/uL — ABNORMAL HIGH (ref 4.0–10.5)
nRBC: 0 % (ref 0.0–0.2)

## 2021-01-17 LAB — COMPREHENSIVE METABOLIC PANEL
ALT: 15 U/L (ref 0–44)
AST: 20 U/L (ref 15–41)
Albumin: 3.7 g/dL (ref 3.5–5.0)
Alkaline Phosphatase: 40 U/L (ref 38–126)
Anion gap: 8 (ref 5–15)
BUN: 21 mg/dL (ref 8–23)
CO2: 34 mmol/L — ABNORMAL HIGH (ref 22–32)
Calcium: 9.2 mg/dL (ref 8.9–10.3)
Chloride: 101 mmol/L (ref 98–111)
Creatinine, Ser: 1.38 mg/dL — ABNORMAL HIGH (ref 0.44–1.00)
GFR, Estimated: 42 mL/min — ABNORMAL LOW (ref >60)
Glucose, Bld: 213 mg/dL — ABNORMAL HIGH (ref 70–99)
Potassium: 4 mmol/L (ref 3.5–5.1)
Sodium: 143 mmol/L (ref 135–145)
Total Bilirubin: 0.6 mg/dL (ref 0.3–1.2)
Total Protein: 7.7 g/dL (ref 6.5–8.1)

## 2021-01-17 LAB — MAGNESIUM: Magnesium: 3.3 mg/dL — ABNORMAL HIGH (ref 1.7–2.4)

## 2021-01-17 LAB — LACTIC ACID, PLASMA: Lactic Acid, Venous: 1.2 mmol/L (ref 0.5–1.9)

## 2021-01-17 LAB — BRAIN NATRIURETIC PEPTIDE: B Natriuretic Peptide: 475.6 pg/mL — ABNORMAL HIGH (ref 0.0–100.0)

## 2021-01-17 LAB — TROPONIN I (HIGH SENSITIVITY)
Troponin I (High Sensitivity): 14 ng/L (ref <18)
Troponin I (High Sensitivity): 25 ng/L — ABNORMAL HIGH (ref <18)

## 2021-01-17 LAB — PROCALCITONIN: Procalcitonin: <0.1 ng/mL

## 2021-01-17 LAB — RESP PANEL BY RT-PCR (FLU A&B, COVID) ARPGX2
Influenza A by PCR: NEGATIVE
Influenza B by PCR: NEGATIVE
SARS Coronavirus 2 by RT PCR: NEGATIVE

## 2021-01-17 LAB — CBG MONITORING, ED: Glucose-Capillary: 210 mg/dL — ABNORMAL HIGH (ref 70–99)

## 2021-01-17 LAB — TSH: TSH: 0.802 u[IU]/mL (ref 0.350–4.500)

## 2021-01-17 MED ORDER — PREDNISONE 20 MG PO TABS: 40.0000 mg | ORAL_TABLET | Freq: Every day | ORAL | Status: DC

## 2021-01-17 MED ORDER — ATORVASTATIN CALCIUM 80 MG PO TABS
80.0000 mg | ORAL_TABLET | Freq: Every day | ORAL | Status: DC
  Administered 2021-01-18 – 2021-01-20 (×4): 80 mg via ORAL
  Filled 2021-01-17 (×4): qty 1

## 2021-01-17 MED ORDER — OXYCODONE-ACETAMINOPHEN 7.5-325 MG PO TABS
1.0000 | ORAL_TABLET | Freq: Four times a day (QID) | ORAL | Status: DC | PRN
  Administered 2021-01-19 – 2021-01-20 (×6): 1 via ORAL
  Filled 2021-01-17 (×6): qty 1

## 2021-01-17 MED ORDER — TIOTROPIUM BROMIDE MONOHYDRATE 18 MCG IN CAPS
1.0000 | ORAL_CAPSULE | Freq: Every day | RESPIRATORY_TRACT | Status: DC
  Administered 2021-01-17 – 2021-01-21 (×5): 18 ug via RESPIRATORY_TRACT
  Filled 2021-01-17: qty 5

## 2021-01-17 MED ORDER — CITALOPRAM HYDROBROMIDE 20 MG PO TABS
40.0000 mg | ORAL_TABLET | Freq: Every day | ORAL | Status: DC
  Administered 2021-01-17 – 2021-01-21 (×5): 40 mg via ORAL
  Filled 2021-01-17 (×5): qty 2

## 2021-01-17 MED ORDER — ALBUTEROL SULFATE HFA 108 (90 BASE) MCG/ACT IN AERS: 2.0000 | INHALATION_SPRAY | RESPIRATORY_TRACT | Status: DC | PRN

## 2021-01-17 MED ORDER — PANTOPRAZOLE SODIUM 40 MG PO TBEC
40.0000 mg | DELAYED_RELEASE_TABLET | Freq: Two times a day (BID) | ORAL | Status: DC
  Administered 2021-01-17 – 2021-01-21 (×8): 40 mg via ORAL
  Filled 2021-01-17 (×8): qty 1

## 2021-01-17 MED ORDER — MELATONIN 5 MG PO TABS
10.0000 mg | ORAL_TABLET | Freq: Every evening | ORAL | Status: DC | PRN
  Administered 2021-01-18 – 2021-01-20 (×3): 10 mg via ORAL
  Filled 2021-01-17 (×3): qty 2

## 2021-01-17 MED ORDER — FUROSEMIDE 10 MG/ML IJ SOLN
40.0000 mg | Freq: Four times a day (QID) | INTRAMUSCULAR | Status: DC
  Administered 2021-01-17 – 2021-01-18 (×3): 40 mg via INTRAVENOUS
  Filled 2021-01-17 (×3): qty 4

## 2021-01-17 MED ORDER — NICOTINE 21 MG/24HR TD PT24
21.0000 mg | MEDICATED_PATCH | Freq: Every day | TRANSDERMAL | Status: DC
  Administered 2021-01-17 – 2021-01-21 (×5): 21 mg via TRANSDERMAL
  Filled 2021-01-17 (×5): qty 1

## 2021-01-17 MED ORDER — SODIUM CHLORIDE 0.9% FLUSH: 3.0000 mL | INTRAVENOUS | Status: DC | PRN

## 2021-01-17 MED ORDER — FENOFIBRATE 160 MG PO TABS
160.0000 mg | ORAL_TABLET | Freq: Every day | ORAL | Status: DC
  Administered 2021-01-17 – 2021-01-21 (×5): 160 mg via ORAL
  Filled 2021-01-17 (×6): qty 1

## 2021-01-17 MED ORDER — FAMOTIDINE 20 MG PO TABS
40.0000 mg | ORAL_TABLET | Freq: Every day | ORAL | Status: DC
  Administered 2021-01-18 – 2021-01-20 (×4): 40 mg via ORAL
  Filled 2021-01-17 (×3): qty 2

## 2021-01-17 MED ORDER — ACETAMINOPHEN 325 MG PO TABS: 650.0000 mg | ORAL_TABLET | Freq: Four times a day (QID) | ORAL | Status: DC | PRN

## 2021-01-17 MED ORDER — DOCUSATE SODIUM 100 MG PO CAPS: 100.0000 mg | ORAL_CAPSULE | Freq: Every evening | ORAL | Status: DC | PRN

## 2021-01-17 MED ORDER — ACETAMINOPHEN 650 MG RE SUPP: 650.0000 mg | Freq: Four times a day (QID) | RECTAL | Status: DC | PRN

## 2021-01-17 MED ORDER — IPRATROPIUM-ALBUTEROL 0.5-2.5 (3) MG/3ML IN SOLN
3.0000 mL | Freq: Four times a day (QID) | RESPIRATORY_TRACT | Status: DC
  Administered 2021-01-17 – 2021-01-18 (×2): 3 mL via RESPIRATORY_TRACT
  Filled 2021-01-17: qty 3

## 2021-01-17 MED ORDER — METHYLPREDNISOLONE SODIUM SUCC 40 MG IJ SOLR: 40.0000 mg | Freq: Three times a day (TID) | INTRAMUSCULAR | Status: DC

## 2021-01-17 MED ORDER — GABAPENTIN 600 MG PO TABS
600.0000 mg | ORAL_TABLET | Freq: Two times a day (BID) | ORAL | Status: DC
  Administered 2021-01-17 – 2021-01-21 (×8): 600 mg via ORAL
  Filled 2021-01-17 (×8): qty 1

## 2021-01-17 MED ORDER — SENNA 8.6 MG PO TABS
1.0000 | ORAL_TABLET | Freq: Two times a day (BID) | ORAL | Status: DC
  Administered 2021-01-17 – 2021-01-21 (×8): 8.6 mg via ORAL
  Filled 2021-01-17 (×8): qty 1

## 2021-01-17 MED ORDER — IPRATROPIUM-ALBUTEROL 0.5-2.5 (3) MG/3ML IN SOLN: 6.0000 mL | Freq: Once | RESPIRATORY_TRACT | Status: AC

## 2021-01-17 MED ORDER — LEVOTHYROXINE SODIUM 50 MCG PO TABS
75.0000 ug | ORAL_TABLET | Freq: Every day | ORAL | Status: DC
  Administered 2021-01-18 – 2021-01-21 (×4): 75 ug via ORAL
  Filled 2021-01-17 (×4): qty 1

## 2021-01-17 MED ORDER — HYDROXYZINE HCL 50 MG PO TABS
50.0000 mg | ORAL_TABLET | Freq: Four times a day (QID) | ORAL | Status: DC | PRN
  Administered 2021-01-18 – 2021-01-20 (×2): 50 mg via ORAL
  Filled 2021-01-17 (×4): qty 1

## 2021-01-17 MED ORDER — FUROSEMIDE 10 MG/ML IJ SOLN
60.0000 mg | Freq: Once | INTRAMUSCULAR | Status: AC
  Administered 2021-01-17: 60 mg via INTRAVENOUS
  Filled 2021-01-17: qty 8

## 2021-01-17 MED ORDER — ALBUTEROL SULFATE (2.5 MG/3ML) 0.083% IN NEBU: 2.5000 mg | INHALATION_SOLUTION | RESPIRATORY_TRACT | Status: DC | PRN

## 2021-01-17 MED ORDER — FERROUS SULFATE 325 (65 FE) MG PO TABS
325.0000 mg | ORAL_TABLET | Freq: Every day | ORAL | Status: DC
  Administered 2021-01-17 – 2021-01-18 (×2): 325 mg via ORAL
  Filled 2021-01-17 (×2): qty 1

## 2021-01-17 MED ORDER — METFORMIN HCL 500 MG PO TABS
500.0000 mg | ORAL_TABLET | Freq: Two times a day (BID) | ORAL | Status: DC
  Administered 2021-01-17 – 2021-01-21 (×8): 500 mg via ORAL
  Filled 2021-01-17 (×8): qty 1

## 2021-01-17 MED ORDER — SODIUM CHLORIDE 0.9% FLUSH
3.0000 mL | Freq: Two times a day (BID) | INTRAVENOUS | Status: DC
  Administered 2021-01-17 – 2021-01-21 (×7): 3 mL via INTRAVENOUS

## 2021-01-17 MED ORDER — METHYLPREDNISOLONE SODIUM SUCC 40 MG IJ SOLR
40.0000 mg | Freq: Three times a day (TID) | INTRAMUSCULAR | Status: AC
  Administered 2021-01-17 – 2021-01-21 (×11): 40 mg via INTRAVENOUS
  Filled 2021-01-17 (×11): qty 1

## 2021-01-17 MED ORDER — IRBESARTAN 150 MG PO TABS
75.0000 mg | ORAL_TABLET | Freq: Every day | ORAL | Status: DC
  Administered 2021-01-17 – 2021-01-21 (×5): 75 mg via ORAL
  Filled 2021-01-17 (×7): qty 1

## 2021-01-17 MED ORDER — APIXABAN 5 MG PO TABS
5.0000 mg | ORAL_TABLET | Freq: Two times a day (BID) | ORAL | Status: DC
  Administered 2021-01-17 – 2021-01-21 (×8): 5 mg via ORAL
  Filled 2021-01-17 (×8): qty 1

## 2021-01-17 MED ORDER — SODIUM CHLORIDE 0.9 % IV SOLN: 250.0000 mL | INTRAVENOUS | Status: DC | PRN

## 2021-01-17 MED ORDER — IPRATROPIUM-ALBUTEROL 0.5-2.5 (3) MG/3ML IN SOLN
RESPIRATORY_TRACT | Status: AC
  Administered 2021-01-17: 6 mL via RESPIRATORY_TRACT
  Filled 2021-01-17: qty 9

## 2021-01-17 NOTE — ED Provider Notes (Signed)
[facility] Emergency Department Provider Note ____________________________________________   Event Date/Time   First MD Initiated Contact with Patient 01/17/21 1023     (approximate)  I have reviewed the triage vital signs and the nursing notes.  HISTORY  Chief Complaint Respiratory Distress   HPI Altheria A Dekker is a 70 y.o. femalewho presents to the ED for evaluation of respiratory distress.   Chart review indicates hx diastolic dysfunction and CAD.  Morbid obesity, COPD and paroxysmal A. fib.  Severe aortic stenosis.  Anticoagulated on Eliquis.  She presents to the ED via EMS from home for evaluation of respiratory distress and shortness of breath.  She was found at home, confused, with sats in the 60s on room air.  EMS placed on CPAP, provided Solu-Medrol, magnesium, duo nebs and 0.15 mg IM epinephrine.   Patient presents to the ED obtunded on EMS CPAP, briefly arousing to sternal rub, and history is limited due to her mental status and respiratory status. She has saturations in the mid 60s on the monitor with a good waveform when we transition her to our BiPAP machine on arrival.  Quickly improves with this positive pressure.  Past Medical History:  Diagnosis Date   (HFpEF) heart failure with preserved ejection fraction ([place])    a. 09/2020 Echo: EF >55%; b. 10/2020 Echo: EF 55-60%. RVSP 46mmHg.   Anxiety    Asthma    CAD (coronary artery disease)    a. 03/2020 Cath: LM nl, LAD 20, LCX 60p, 85m/d, RCA 30p, 21m, RPDA 30-->Med Rx.   COPD (chronic obstructive pulmonary disease) (HCC)    Diabetes mellitus without complication (HCC)    Dyspnea    GERD (gastroesophageal reflux disease)    History of hiatal hernia    History of orthopnea    History of radiation therapy 07/29/20-08/08/20   Right Lung, SBRT Dr. Gery Pray   Hypothyroidism    Moderate aortic stenosis    a. 03/2020 Echo: Mod-sev AS. AVA 1.11cm^2. Mean grad 11mmHg; b. 10/2020 Echo: EF 55-60%,  no rwma, nl RV fxn, RVSP 68mmHg, mild BAE, mod-sev mitral annular Ca2+ w/o MS/MR. Mod-sev AS. AVA 0.91cm^2 (VTI), mean grad 54mmHg.   Neuropathy    Oxygen deficiency    3L/HS   PAF (paroxysmal atrial fibrillation) ([place])    a. Post-termination pauses. Prev on amio.  CHA2DS2VASc 6-->eliqis.   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   Weakness    Acute hypoxemic respiratory failure ([place]) 12/23/2020   Acute exacerbation of chronic obstructive pulmonary disease (COPD) ([place]) 12/22/2020   Aspiration into airway    PAF (paroxysmal atrial fibrillation) (HCC)    CAD (coronary artery disease) 09/26/2020   Hyperkalemia 09/26/2020   CHF exacerbation ([place]) 09/26/2020   Acute on chronic respiratory failure with hypoxia and hypercapnia ([place]) 09/25/2020   Bradycardia 09/25/2020   Lung cancer ([place]) 05/26/2020   Atrial flutter with rapid ventricular response ([place]) 05/26/2020   Demand ischemia (HCC)    Acute on chronic heart failure with preserved ejection fraction (HFpEF) ([place])    Aortic stenosis-moderate to severe 03/2020    Obesity, Class III, BMI 40-49.9 (morbid obesity) ([place]) 03/29/2020   Chronic respiratory failure with  hypoxia ([place]) 03/29/2020   OSA (obstructive sleep apnea) 03/29/2020   Essential hypertension 03/29/2020   Hypothyroidism 03/29/2020   Chest pain 03/29/2020   Elevated troponin 03/29/2020   Chronic diastolic (congestive) heart failure ([place]) 07/12/2018   Type 2 diabetes mellitus without complication ([place]) 39/76/7341   COPD exacerbation ([place]) 07/12/2018   Tobacco use 07/12/2018   Hypotension 07/12/2018   Respiratory failure ([place]) 07/04/2018   Morbid obesity due to excess calories ([place]) 02/08/2016    Past Surgical  History:  Procedure Laterality Date   BREAST SURGERY     CATARACT EXTRACTION W/PHACO Right 03/02/2018   Procedure: CATARACT EXTRACTION PHACO AND INTRAOCULAR LENS PLACEMENT ([place]);  Surgeon: Marchia Meiers, MD;  Location: [facility] ORS;  Service: Ophthalmology;  Laterality: Right;  [place] 01:15 CDE 16.46 Fluid pack lot # [id number]   CATARACT EXTRACTION W/PHACO Left 04/27/2018   Procedure: CATARACT EXTRACTION PHACO AND INTRAOCULAR LENS PLACEMENT ([place])- LEFT DIABETIC;  Surgeon: Marchia Meiers, MD;  Location: [facility] ORS;  Service: Ophthalmology;  Laterality: Left;  Lot # [id number] H [place]: 00:46.3 CDE: 8.07    CYST EXCISION     FOREHEAD   FOOT SURGERY     CYST   RIGHT/LEFT HEART CATH AND CORONARY ANGIOGRAPHY N/A 03/31/2020   Procedure: RIGHT/LEFT HEART CATH AND CORONARY ANGIOGRAPHY;  Surgeon: Wellington Hampshire, MD;  Location: [facility];  Service: Cardiovascular;  Laterality: N/A;   TUBAL LIGATION      Prior to Admission medications   Medication Sig Start Date End Date Taking? Authorizing Provider  albuterol (VENTOLIN HFA) 108 (90 Base) MCG/ACT inhaler Inhale 2 puffs into the lungs every 4 (four) hours as needed for wheezing or shortness of breath.     [provider]  apixaban (ELIQUIS) 5 MG TABS tablet Take 1 tablet (5 mg total) by mouth 2 (two) times daily. 11/03/20   Minna Merritts, MD  atorvastatin (LIPITOR) 80 MG tablet Take 1 tablet (80 mg total) by mouth at bedtime. 11/03/20   Minna Merritts, MD  citalopram (CELEXA) 40 MG tablet Take 40 mg by mouth daily. 06/14/20   [provider]  docusate sodium (COLACE) 100 MG capsule Take 100 mg by mouth at bedtime as needed for mild constipation or moderate constipation.    [provider]  famotidine (PEPCID) 40 MG tablet Take 40 mg by mouth at bedtime.    [provider]  fenofibrate 160 MG tablet Take 160 mg by mouth daily.    [provider]  ferrous sulfate 325 (65 FE) MG tablet Take 325 mg by mouth  daily. 06/14/20   [provider]  gabapentin (NEURONTIN) 600 MG tablet Take 600 mg by mouth 2 (two) times daily.  11/20/19   [provider]  hydrOXYzine (VISTARIL) 50 MG capsule Take 50 mg by mouth every 6 (six) hours as needed for anxiety. 03/14/20   [provider]  ipratropium-albuterol (DUONEB) 0.5-2.5 (3) MG/3ML SOLN Take 3 mLs by nebulization 4 (four) times daily.    [provider]  irbesartan (AVAPRO) 75 MG tablet Take 1 tablet (75 mg total) by mouth daily. 01/02/21 12/28/21  Wouk, Ailene Rud, MD  levothyroxine (SYNTHROID) 75 MCG tablet Take 75 mcg by mouth daily before breakfast.    [provider]  lidocaine (LIDODERM) 5 % Place 3 patches onto the skin daily. (Remove after 12 hours and keep off for 12 hours)    [provider]  Melatonin 10 MG TABS Take 10 mg by mouth at bedtime as  needed (sleep).    [provider]  metFORMIN (GLUCOPHAGE) 500 MG tablet Take 500 mg by mouth 2 (two) times daily. 09/19/19   [provider]  nicotine (NICODERM CQ - DOSED IN MG/24 HOURS) 21 mg/24hr patch Place 1 patch (21 mg total) onto the skin daily. 08/15/20   Marrianne Mood D, PA-C  oxyCODONE-acetaminophen (PERCOCET) 10-325 MG tablet Take 1 tablet by mouth 4 (four) times daily as needed for pain.    [provider]  pantoprazole (PROTONIX) 40 MG tablet Take 40 mg by mouth 2 (two) times daily. 11/11/20   [provider]  Tiotropium Bromide Monohydrate 2.5 MCG/ACT AERS Inhale 2 puffs into the lungs daily.     [provider]  torsemide (DEMADEX) 20 MG tablet Take 1 tablet (20 mg total) by mouth daily. Alternating with 20 mg by mouth every other day 01/02/21   Wouk, Ailene Rud, MD  Vitamin D, Ergocalciferol, (DRISDOL) 1.25 MG (50000 UNIT) CAPS capsule Take 50,000 Units by mouth once a week.    [provider]    Allergies Altace [ramipril]  Family History  Problem Relation Age of Onset   Heart  disease Mother    Cancer Father     Social History Social History   Tobacco Use   Smoking status: Former    Packs/day: 1.00    Years: 50.00    Pack years: 50.00    Types: Cigarettes   Smokeless tobacco: Never  Vaping Use   Vaping Use: Never used  Substance Use Topics   Alcohol use: Not Currently   Drug use: Yes    Comment: prescribed oxy   Review of Systems  Unable to be accurately assessed due to patient's respiratory status and mental status on arrival. ____________________________________________   PHYSICAL EXAM:  VITAL SIGNS: Vitals:   01/17/21 1115 01/17/21 1145  BP: (!) 117/50 (!) 100/54  Pulse: 96 92  Resp: 20 19  Temp:    SpO2: 98% 92%    Constitutional: Obtunded on BiPAP.  Briefly arouses to sternal rub.  On reassessments, she arouses to knocking on the door, keeping her eyes open and follows simple commands in all 4 extremities. Eyes: Conjunctivae are normal. PERRL. EOMI. Head: Atraumatic. Nose: No congestion/rhinnorhea. Mouth/Throat: Mucous membranes are moist.  Oropharynx non-erythematous. Neck: No stridor. No cervical spine tenderness to palpation. Cardiovascular: Tachycardic and irregular rhythm. Grossly normal heart sounds.  Strong and bounding DP pulses bilaterally.  Respiratory: Normal respiratory effort.  No retractions. Lungs CTAB. Gastrointestinal: Soft , nondistended, nontender to palpation. No CVA tenderness. Musculoskeletal: No lower extremity tenderness .  No joint effusions. No signs of acute trauma. Trace pitting edema to bilateral lower extremities symmetrically. Neurologic:  No gross focal neurologic deficits are appreciated.  On my reassessments, she was noted to follow commands in all 4 extremities. Skin:  Skin is warm, dry and intact. No rash noted. Psychiatric: Mood and affect are difficult to assess ____________________________________________   LABS (all labs ordered are listed, but only abnormal results are displayed)  Labs  Reviewed  BLOOD GAS, ARTERIAL - Abnormal; Notable for the following components:      Result Value   pH, Arterial 7.20 (*)    pCO2 arterial 95 (*)    pO2, Arterial 337 (*)    Bicarbonate 37.1 (*)    Acid-Base Excess 6.2 (*)    All other components within normal limits  COMPREHENSIVE METABOLIC PANEL - Abnormal; Notable for the following components:   CO2 34 (*)  Glucose, Bld 213 (*)    Creatinine, Ser 1.38 (*)    GFR, Estimated 42 (*)    All other components within normal limits  CBC WITH DIFFERENTIAL/PLATELET - Abnormal; Notable for the following components:   WBC 11.4 (*)    Eosinophils Absolute 0.7 (*)    Abs Immature Granulocytes 0.08 (*)    All other components within normal limits  BRAIN NATRIURETIC PEPTIDE - Abnormal; Notable for the following components:   B Natriuretic Peptide 475.6 (*)    All other components within normal limits  MAGNESIUM - Abnormal; Notable for the following components:   Magnesium 3.3 (*)    All other components within normal limits  BLOOD GAS, ARTERIAL - Abnormal; Notable for the following components:   pH, Arterial 7.30 (*)    pCO2 arterial 68 (*)    pO2, Arterial 63 (*)    Bicarbonate 33.5 (*)    Acid-Base Excess 5.5 (*)    All other components within normal limits  CBG MONITORING, ED - Abnormal; Notable for the following components:   Glucose-Capillary 210 (*)    All other components within normal limits  RESP PANEL BY RT-PCR (FLU A&B, COVID) ARPGX2  CULTURE, BLOOD (SINGLE)  LACTIC ACID, PLASMA  PROCALCITONIN  LACTIC ACID, PLASMA  TROPONIN I (HIGH SENSITIVITY)  TROPONIN I (HIGH SENSITIVITY)   ____________________________________________  12 Lead EKG  A. fib with RVR, rate of 140 bpm.  Normal axis.  Prolonged QTC of 529.  No STEMI. ____________________________________________  RADIOLOGY  ED MD interpretation: 1 view CXR reviewed by me with pulmonary vascular congestion without clear lobar infiltration.  Official radiology  report(s): DG Chest Portable 1 View  Result Date: 01/17/2021 CLINICAL DATA:  Shortness of breath. EXAM: PORTABLE CHEST 1 VIEW COMPARISON:  Chest radiograph 12/30/2020. FINDINGS: Monitoring leads overlie the patient. Stable cardiomegaly. Aortic atherosclerosis. Diffuse bilateral interstitial pulmonary opacities. No pleural effusion or pneumothorax. Thoracic spine degenerative changes. IMPRESSION: Findings most compatible with cardiomegaly and interstitial edema. Some of the background of interstitial changes are likely chronic in etiology. Electronically Signed   By: Lovey Newcomer M.D.   On: 01/17/2021 10:46    ____________________________________________   PROCEDURES and INTERVENTIONS  Procedure(s) performed (including Critical Care):  .1-3 Lead EKG Interpretation  Date/Time: 01/17/2021 12:18 PM Performed by: Vladimir Crofts, MD Authorized by: Vladimir Crofts, MD     Interpretation: abnormal     ECG rate:  142   ECG rate assessment: tachycardic     Rhythm: atrial fibrillation     Ectopy: none     Conduction: normal   .Critical Care  Date/Time: 01/17/2021 12:19 PM Performed by: Vladimir Crofts, MD Authorized by: Vladimir Crofts, MD   Critical care provider statement:    Critical care time (minutes):  45   Critical care was necessary to treat or prevent imminent or life-threatening deterioration of the following conditions:  Respiratory failure   Critical care was time spent personally by me on the following activities:  Discussions with consultants, evaluation of patient's response to treatment, examination of patient, ordering and performing treatments and interventions, ordering and review of laboratory studies, ordering and review of radiographic studies, pulse oximetry, re-evaluation of patient's condition, obtaining history from patient or surrogate and review of old charts  Medications  ipratropium-albuterol (DUONEB) 0.5-2.5 (3) MG/3ML nebulizer solution 6 mL (6 mLs Nebulization Given 01/17/21  1025)  furosemide (LASIX) injection 60 mg (60 mg Intravenous Given 01/17/21 1203)    ____________________________________________   MDM / ED COURSE   70 year old  woman presents to the ED with evidence of COPD and CHF exacerbations, requiring BiPAP and medical admission.  She looks quite unwell on presentation, obtunded and in respiratory distress with very poor airflow on auscultation.  Quickly placed her on BiPAP which ultimately improves her respiratory status.  ABG confirms respiratory acidosis.  She also has signs of volume overload and likely CHF component, but seems primarily to be retention and COPD.  No evidence of sepsis or infectious pathology.  No evidence of ACS.  We will admit to medicine for further work-up and management.  Clinical Course as of 01/17/21 1213  Sat Jan 17, 2021  1045 Reassessed.  Looks better.  More alert and heart rate is slowing.  Respiratory rate is slowing as well. [DS]  2633 Reassessed. Continues to look better than presentation. Will get repeat abg [DS]  1210 I discuss with Dr. Linda Hedges, who agrees to admit [DS]    Clinical Course User Index [DS] Vladimir Crofts, MD    ____________________________________________   FINAL CLINICAL IMPRESSION(S) / ED DIAGNOSES  Final diagnoses:  COPD exacerbation ([place])  Shortness of breath  Acute respiratory failure with hypoxia and hypercapnia [facility])     ED Discharge Orders     None        Yordin Rhoda   Note:  This document was prepared using Dragon voice recognition software and may include unintentional dictation errors.    Vladimir Crofts, MD 01/17/21 1230

## 2021-01-17 NOTE — H&P (Signed)
History and Physical    [name] KWI:[id number] DOB: 29-May-1950 DOA: 01/17/2021  PCP: [place] (Confirm with patient/family/NH records and if not entered, this has to be entered at [facility] point of entry) Patient coming from: home  I have personally briefly reviewed patient's old medical records in [place]  Chief Complaint: acute respiratory distress  HPI: Kristin [name] is a 70 y.o. female with medical history significant of CAD, hypothyroidism, morbid obesity, Emphysema, non-small cell lung cancer, PAF/flutter, DM2, HFpEF. She presents to the ED via EMS from home for evaluation of respiratory distress and shortness of breath.  She was found at home, confused, with sats in the 60s on room air.  EMS placed on CPAP, provided Solu-Medrol, magnesium, duo nebs and 0.15 mg IM epinephrine.    Patient presents to the ED obtunded on EMS CPAP, briefly arousing to sternal rub, and history is limited due to her mental status and respiratory status. She has saturations in the mid 60s on the monitor with a good waveform when we transition her to our BiPAP machine on arrival.  Quickly improves with this positive pressure.  ED Course: 97.4 100/54 HRlow of 57, high of 143, settled at 92. BMI 40. ABG #1 7.2/95/337, #2 7.3/66/64. EDP exam non-focal. Lab: Cmet nl except Glucose 213, WBC 11.4 with 60/23/9/6 Eos. Lactic acid 1.2, Troponoin #1 14, BNP 475.6, procalcitonin <0.10. CXR with cardiomegaly and interstial edema. Patient stabilized on BiPAP with improved mentation and decreasing CO2. She was administered IV lasix 60 mg. TRH called to admit for continued management of acute on chronic respiratory failure with hypercarbia and acute on chronic HFpEF.  Review of Systems: As per HPI otherwise 10 point review of systems negative.    Past Medical History:  Diagnosis Date   (HFpEF) heart failure with preserved ejection fraction ([place])    a. 09/2020 Echo: EF >55%; b. 10/2020 Echo: EF 55-60%. RVSP  28mmHg.   Anxiety    Asthma    CAD (coronary artery disease)    a. 03/2020 Cath: LM nl, LAD 20, LCX 60p, 43m/d, RCA 30p, 4m, RPDA 30-->Med Rx.   COPD (chronic obstructive pulmonary disease) (HCC)    Diabetes mellitus without complication (HCC)    Dyspnea    GERD (gastroesophageal reflux disease)    History of hiatal hernia    History of orthopnea    History of radiation therapy 07/29/20-08/08/20   Right Lung, SBRT Dr. Gery Pray   Hypothyroidism    Moderate aortic stenosis    a. 03/2020 Echo: Mod-sev AS. AVA 1.11cm^2. Mean grad 81mmHg; b. 10/2020 Echo: EF 55-60%, no rwma, nl RV fxn, RVSP 75mmHg, mild BAE, mod-sev mitral annular Ca2+ w/o MS/MR. Mod-sev AS. AVA 0.91cm^2 (VTI), mean grad 14mmHg.   Neuropathy    Oxygen deficiency    3L/HS   PAF (paroxysmal atrial fibrillation) ([place])    a. Post-termination pauses. Prev on amio.  CHA2DS2VASc 6-->eliqis.   Pain    BACK/DDD   Peripheral vascular disease (HCC)    RLS (restless legs syndrome)    Sleep apnea    Wheezing     Past Surgical History:  Procedure Laterality Date   BREAST SURGERY     CATARACT EXTRACTION W/PHACO Right 03/02/2018   Procedure: CATARACT EXTRACTION PHACO AND INTRAOCULAR LENS PLACEMENT ([place]);  Surgeon: Marchia Meiers, MD;  Location: [facility] ORS;  Service: Ophthalmology;  Laterality: Right;  [place] 01:15 CDE 16.46 Fluid pack lot # [id number]   CATARACT EXTRACTION W/PHACO Left 04/27/2018  Procedure: CATARACT EXTRACTION PHACO AND INTRAOCULAR LENS PLACEMENT ([place])- LEFT DIABETIC;  Surgeon: Marchia Meiers, MD;  Location: [facility] ORS;  Service: Ophthalmology;  Laterality: Left;  Lot # [id number] H [place]: 00:46.3 CDE: 8.07    CYST EXCISION     FOREHEAD   FOOT SURGERY     CYST   RIGHT/LEFT HEART CATH AND CORONARY ANGIOGRAPHY N/A 03/31/2020   Procedure: RIGHT/LEFT HEART CATH AND CORONARY ANGIOGRAPHY;  Surgeon: Wellington Hampshire, MD;  Location: [place] [facility];  Service: Cardiovascular;  Laterality: N/A;   TUBAL LIGATION      Soc Hx  - widowed after a long marriage. Two daughters, two sons - one deceased. Many grandchildren. Worked in [employer]. Has supportive family close at hand.    reports that she has quit smoking. Her smoking use included cigarettes. She has a 50.00 pack-year smoking history. She has never used smokeless tobacco. She reports that she does not currently use alcohol. She reports current drug use.  Allergies  Allergen Reactions   Altace [Ramipril] Swelling    Family History  Problem Relation Age of Onset   Heart disease Mother    Cancer Father      Prior to Admission medications   Medication Sig Start Date End Date Taking? Authorizing Provider  albuterol (VENTOLIN HFA) 108 (90 Base) MCG/ACT inhaler Inhale 2 puffs into the lungs every 4 (four) hours as needed for wheezing or shortness of breath.     [provider]  apixaban (ELIQUIS) 5 MG TABS tablet Take 1 tablet (5 mg total) by mouth 2 (two) times daily. 11/03/20   Minna Merritts, MD  atorvastatin (LIPITOR) 80 MG tablet Take 1 tablet (80 mg total) by mouth at bedtime. 11/03/20   Minna Merritts, MD  citalopram (CELEXA) 40 MG tablet Take 40 mg by mouth daily. 06/14/20   [provider]  docusate sodium (COLACE) 100 MG capsule Take 100 mg by mouth at bedtime as needed for mild constipation or moderate constipation.    [provider]  famotidine (PEPCID) 40 MG tablet Take 40 mg by mouth at bedtime.    [provider]  fenofibrate 160 MG tablet Take 160 mg by mouth daily.    [provider]  ferrous sulfate 325 (65 FE) MG tablet Take 325 mg by mouth daily. 06/14/20   [provider]  gabapentin (NEURONTIN) 600 MG tablet Take 600 mg by mouth 2 (two) times daily.  11/20/19   [provider]  hydrOXYzine (VISTARIL) 50 MG capsule Take 50 mg by mouth every 6 (six) hours as needed for anxiety. 03/14/20   [provider]  ipratropium-albuterol (DUONEB) 0.5-2.5 (3) MG/3ML SOLN Take 3  mLs by nebulization 4 (four) times daily.    [provider]  irbesartan (AVAPRO) 75 MG tablet Take 1 tablet (75 mg total) by mouth daily. 01/02/21 12/28/21  Wouk, Ailene Rud, MD  levothyroxine (SYNTHROID) 75 MCG tablet Take 75 mcg by mouth daily before breakfast.    [provider]  lidocaine (LIDODERM) 5 % Place 3 patches onto the skin daily. (Remove after 12 hours and keep off for 12 hours)    [provider]  Melatonin 10 MG TABS Take 10 mg by mouth at bedtime as needed (sleep).    [provider]  metFORMIN (GLUCOPHAGE) 500 MG tablet Take 500 mg by mouth 2 (two) times daily. 09/19/19   [provider]  nicotine (NICODERM CQ - DOSED IN MG/24 HOURS) 21 mg/24hr patch Place 1  patch (21 mg total) onto the skin daily. 08/15/20   Marrianne Mood D, PA-C  oxyCODONE-acetaminophen (PERCOCET) 10-325 MG tablet Take 1 tablet by mouth 4 (four) times daily as needed for pain.    [provider]  pantoprazole (PROTONIX) 40 MG tablet Take 40 mg by mouth 2 (two) times daily. 11/11/20   [provider]  Tiotropium Bromide Monohydrate 2.5 MCG/ACT AERS Inhale 2 puffs into the lungs daily.     [provider]  torsemide (DEMADEX) 20 MG tablet Take 1 tablet (20 mg total) by mouth daily. Alternating with 20 mg by mouth every other day 01/02/21   Wouk, Ailene Rud, MD  Vitamin D, Ergocalciferol, (DRISDOL) 1.25 MG (50000 UNIT) CAPS capsule Take 50,000 Units by mouth once a week.    [provider]    Physical Exam: Vitals:   01/17/21 1045 01/17/21 1100 01/17/21 1115 01/17/21 1145  BP: (!) 92/45  (!) 117/50 (!) 100/54  Pulse: (!) 57 96 96 92  Resp: (!) 21 20 20 19   Temp:      TempSrc:      SpO2: 100% 99% 98% 92%  Weight:      Height:        Vitals:   01/17/21 1045 01/17/21 1100 01/17/21 1115 01/17/21 1145  BP: (!) 92/45  (!) 117/50 (!) 100/54  Pulse: (!) 57 96 96 92  Resp: (!) 21 20 20 19   Temp:      TempSrc:      SpO2: 100%  99% 98% 92%  Weight:      Height:       General - obese woman on bipap in no distress Eyes: PERRL, lids and conjunctivae normal ENMT: Mucous membranes are moist. Posterior pharynx clear of any exudate or lesions.  Neck: obese, normal, supple, no masses, no thyromegaly Respiratory: On BiPAP and seems comfortable. Oxygenating well. Diffuse inspiratory and expiratory wheezing with prolonged expiratory phase, no rales but may be obscured by wheezing. No neck retractions. No use of abdominal muscles.  Cardiovascular: Regular rate and rhythm with PVCs, auscultation obscured by breath sounds.  No extremity edema. 2+ pedal pulses. No carotid bruits.  Abdomen: Obese, no tenderness, no masses palpated. Bowel sounds positive.  Musculoskeletal: no clubbing / cyanosis. No joint deformity upper and lower extremities. Good ROM, no contractures. Normal muscle tone.  Skin: no rashes, lesions, ulcers. No induration Neurologic: CN 2-12 grossly intact. Sensation intact, DTR normal. Strength 5/5 in all 4.  Psychiatric: Normal judgment and insight. Alert and oriented x 3. Normal mood.     Labs on Admission: I have personally reviewed following labs and imaging studies  CBC: Recent Labs  Lab 01/17/21 1018  WBC 11.4*  NEUTROABS 7.0  HGB 12.0  HCT 39.8  MCV 96.6  PLT 510   Basic Metabolic Panel: Recent Labs  Lab 01/17/21 1018  NA 143  K 4.0  CL 101  CO2 34*  GLUCOSE 213*  BUN 21  CREATININE 1.38*  CALCIUM 9.2  MG 3.3*   GFR: Estimated Creatinine Clearance: 43.1 mL/min (A) (by C-G formula based on SCr of 1.38 mg/dL (H)). Liver Function Tests: Recent Labs  Lab 01/17/21 1018  AST 20  ALT 15  ALKPHOS 40  BILITOT 0.6  PROT 7.7  ALBUMIN 3.7   No results for input(s): LIPASE, AMYLASE in the last 168 hours. No results for input(s): AMMONIA in the last 168 hours. Coagulation Profile: No results for input(s): INR, PROTIME in the last 168 hours. Cardiac Enzymes:  No results for input(s):  CKTOTAL, CKMB, CKMBINDEX, TROPONINI in the last 168 hours. BNP (last 3 results) No results for input(s): PROBNP in the last 8760 hours. HbA1C: No results for input(s): HGBA1C in the last 72 hours. CBG: Recent Labs  Lab 01/17/21 1023  GLUCAP 210*   Lipid Profile: No results for input(s): CHOL, HDL, LDLCALC, TRIG, CHOLHDL, LDLDIRECT in the last 72 hours. Thyroid Function Tests: No results for input(s): TSH, T4TOTAL, FREET4, T3FREE, THYROIDAB in the last 72 hours. Anemia Panel: No results for input(s): VITAMINB12, FOLATE, FERRITIN, TIBC, IRON, RETICCTPCT in the last 72 hours. Urine analysis:    Component Value Date/Time   COLORURINE STRAW (A) 12/30/2020 2017   APPEARANCEUR CLEAR (A) 12/30/2020 2017   LABSPEC 1.009 12/30/2020 2017   PHURINE 6.0 12/30/2020 2017   GLUCOSEU NEGATIVE 12/30/2020 2017   HGBUR NEGATIVE 12/30/2020 2017   BILIRUBINUR NEGATIVE 12/30/2020 2017   KETONESUR NEGATIVE 12/30/2020 2017   PROTEINUR NEGATIVE 12/30/2020 2017   NITRITE NEGATIVE 12/30/2020 2017   LEUKOCYTESUR NEGATIVE 12/30/2020 2017    Radiological Exams on Admission: DG Chest Portable 1 View  Result Date: 01/17/2021 CLINICAL DATA:  Shortness of breath. EXAM: PORTABLE CHEST 1 VIEW COMPARISON:  Chest radiograph 12/30/2020. FINDINGS: Monitoring leads overlie the patient. Stable cardiomegaly. Aortic atherosclerosis. Diffuse bilateral interstitial pulmonary opacities. No pleural effusion or pneumothorax. Thoracic spine degenerative changes. IMPRESSION: Findings most compatible with cardiomegaly and interstitial edema. Some of the background of interstitial changes are likely chronic in etiology. Electronically Signed   By: Lovey Newcomer M.D.   On: 01/17/2021 10:46    EKG: Independently reviewed. A fib with prolonged QT, rapid rate (on tele NSR)  Assessment/Plan Active Problems:   COPD exacerbation (HCC)   Acute on chronic heart failure with preserved ejection fraction (HFpEF) (HCC)   Acute on chronic  respiratory failure with hypoxia and hypercapnia (HCC)   Type 2 diabetes mellitus without complication (HCC)   OSA (obstructive sleep apnea)   Essential hypertension   Lung cancer (HCC)   Hypothyroidism    Pulmonary - acute on chronic respiratory failure with hypercarbia. Improving on BiPAP Plan Continue BiPAP - wean to 3 L [place] o2 as hypercarbia resolves. RT consulted  IV solumedrol 40 mg q8 x 4 doses and transition to prednisone  Continue home inhalational therapy   2. Acute on chronic HFpEF - patient with mild elevation in BNP with interstial edema on CXR. Plan IV lasix 40 mg q 6 x 4 doses then resume demedex  F/u BNP in AM  3. PAF - known problem. Patient on Eliquis. Suspect exacerbation related to #1 & #2 Plan Continue home cardiac meds  Cardiac progressive care admit  4. DM2 - last A1C 5.4% 12/23/20 Plan` Low carb diet  Continue metformin  5. HTN- stable continue home meds  6. Hypothyroidism - last TSH 03/29/20 Plan Add TSH to lab  7. Lung cancer - patient had 5 XRT treatments. Recent CT 01/14/21 wth RUL nodule smaller, new mediastinal nodes, new small node at RUL fissure Plan Keep f/u oncology appt.    DVT prophylaxis: Eliquis  Code Status: full code  Family Communication: daughter present during interview and exam. Answered all questions.   Disposition Plan: home when medically stable ( Consults called: none  Admission status: inpatient to cardiac progressive bed    Adella Hare MD [facility] Pager [phone]  If 7PM-7AM, please contact night-coverage [email or web address] Password [email or web address]  01/17/2021, 1:03 PM

## 2021-01-17 NOTE — ED Notes (Signed)
Request made for transport to the floor ?

## 2021-01-17 NOTE — ED Notes (Signed)
Per MD, Lower pt Fio2 to 25%. RN notified resp.

## 2021-01-17 NOTE — ED Notes (Signed)
RN attempted to call pt daughter [name] as Science writer of daughter calling. Daughter did not answer phone call.
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
## 2021-01-17 NOTE — ED Notes (Signed)
Per MD, Change pt Fio2 to 30% since pt o2 sat is 100%. RN notified resp.

## 2021-01-18 ENCOUNTER — Other Ambulatory Visit: Payer: Self-pay

## 2021-01-18 ENCOUNTER — Inpatient Hospital Stay: Payer: Medicare Other

## 2021-01-18 ENCOUNTER — Encounter: Payer: Self-pay | Admitting: Internal Medicine

## 2021-01-18 DIAGNOSIS — J9601 Acute respiratory failure with hypoxia: Secondary | ICD-10-CM

## 2021-01-18 DIAGNOSIS — J441 Chronic obstructive pulmonary disease with (acute) exacerbation: Secondary | ICD-10-CM

## 2021-01-18 DIAGNOSIS — J9602 Acute respiratory failure with hypercapnia: Secondary | ICD-10-CM

## 2021-01-18 LAB — PROCALCITONIN: Procalcitonin: 0.53 ng/mL

## 2021-01-18 LAB — BRAIN NATRIURETIC PEPTIDE: B Natriuretic Peptide: 986.7 pg/mL — ABNORMAL HIGH (ref 0.0–100.0)

## 2021-01-18 MED ORDER — FUROSEMIDE 10 MG/ML IJ SOLN
80.0000 mg | Freq: Two times a day (BID) | INTRAMUSCULAR | Status: DC
  Administered 2021-01-18 – 2021-01-19 (×2): 80 mg via INTRAVENOUS
  Filled 2021-01-18 (×2): qty 8

## 2021-01-18 MED ORDER — FLUCONAZOLE 100 MG PO TABS: 100.0000 mg | ORAL_TABLET | Freq: Every day | ORAL | Status: DC

## 2021-01-18 MED ORDER — IPRATROPIUM-ALBUTEROL 0.5-2.5 (3) MG/3ML IN SOLN
3.0000 mL | Freq: Four times a day (QID) | RESPIRATORY_TRACT | Status: DC
  Administered 2021-01-18 – 2021-01-21 (×13): 3 mL via RESPIRATORY_TRACT
  Filled 2021-01-18 (×13): qty 3

## 2021-01-18 MED ORDER — NYSTATIN 100000 UNIT/GM EX POWD
Freq: Three times a day (TID) | CUTANEOUS | Status: DC
  Filled 2021-01-18 (×2): qty 15

## 2021-01-18 NOTE — Plan of Care (Signed)

## 2021-01-18 NOTE — Progress Notes (Signed)
Pt was taken off Bipap & transported to floor by ED staff and is currently on [place] at 2 lpm.   RT was not notified of the weaning or transport. The Bipap had been left in the ED hallway outside pts room.

## 2021-01-18 NOTE — Progress Notes (Signed)
Kristin [name]  SPQ:[id number] DOB: [birth year] DOA: 01/17/2021 PCP: [facility]    Brief Narrative:  70 year old with a history of CAD, hypothyroidism, morbid obesity, COPD, non-small cell lung cancer, atrial fibrillation/flutter, DM2, and diastolic CHF who presented to the ED via EMS with severe shortness of breath.  She was found at home confused with sats in the 60s on room air.  She was placed on CPAP and transported to the ED.  At the time of her arrival she remained significantly lethargic.  With transitioning to BiPAP she rapidly improved and became responsive.  Consultants:  None  Code Status: FULL CODE  Antimicrobials:  None  DVT prophylaxis: Eliquis  Subjective: Since admission the patient has been weaned from BiPAP.  She is alert and oriented at the time of my exam.  She does not remember the events leading to her admission clearly.  She denies current chest pain nausea or vomiting or abdominal pain.  She states that she is still not back to her baseline breathing status, and feels "a little tight in my chest when I breathe".  Assessment & Plan:  Acute on chronic hypoxic and hypercarbic respiratory failure Responded favorably to BiPAP -appears to be related to an exacerbation of COPD with active wheezing -continue steroids and inhaled medications -wean oxygen as able  Acute exacerbation of chronic diastolic CHF Diurese and follow closely -evidence of volume overload on physical exam  Paroxysmal atrial fibrillation Heart rate presently controlled -continue usual Eliquis dosing  DM2 Monitor CBG with SSI  HTN Blood pressure reasonably controlled at this time  Hypothyroidism TSH at goal  Lung cancer Status post 5 XRT treatments -CT 8/31 noted right upper lobe nodule decreased in size but did note new mediastinal lymphadenopathy and new small node at right upper lobe fissure -follow-up as outpatient with oncology  Obesity - Body mass index is 43.31  kg/m.    Family Communication: No family present at time of exam Status is: Inpatient  Remains inpatient appropriate because:Inpatient level of care appropriate due to severity of illness  Dispo: The patient is from: Home              Anticipated d/c is to:  Unclear              Patient currently is not medically stable to d/c.   Difficult to place patient No     Objective: Blood pressure (!) 133/50, pulse 63, temperature 98.3 F (36.8 C), resp. rate 16, height 5\' 2"  (1.575 m), weight 107.4 kg, SpO2 97 %.  Intake/Output Summary (Last 24 hours) at 01/18/2021 1014 Last data filed at 01/17/2021 1847 Gross per 24 hour  Intake --  Output 1000 ml  Net -1000 ml   Filed Weights   01/17/21 1024 01/18/21 0441  Weight: 99.8 kg 107.4 kg    Examination: General: No acute respiratory distress Lungs: Distant breath sounds throughout with no focal crackles but blunting of breath sounds in bilateral bases and mild expiratory wheezing diffusely Cardiovascular: Regular rate without murmur gallop or rub normal S1 and S2 Abdomen: Nontender, nondistended, soft, bowel sounds positive, no rebound, no ascites, no appreciable mass Extremities: 2+ bilateral lower extremity edema  CBC: Recent Labs  Lab 01/17/21 1018  WBC 11.4*  NEUTROABS 7.0  HGB 12.0  HCT 39.8  MCV 96.6  PLT 333   Basic Metabolic Panel: Recent Labs  Lab 01/17/21 1018  NA 143  K 4.0  CL 101  CO2 34*  GLUCOSE 213*  BUN 21  CREATININE 1.38*  CALCIUM 9.2  MG 3.3*   GFR: Estimated Creatinine Clearance: 45 mL/min (A) (by C-G formula based on SCr of 1.38 mg/dL (H)).  Liver Function Tests: Recent Labs  Lab 01/17/21 1018  AST 20  ALT 15  ALKPHOS 40  BILITOT 0.6  PROT 7.7  ALBUMIN 3.7    HbA1C: Hgb A1c MFr Bld  Date/Time Value Ref Range Status  12/23/2020 05:09 AM 5.4 4.8 - 5.6 % Final    Comment:    (NOTE) Pre diabetes:          5.7%-6.4%  Diabetes:              >6.4%  Glycemic control for    <7.0% adults with diabetes   09/26/2020 01:26 AM 6.2 (H) 4.8 - 5.6 % Final    Comment:    (NOTE) Pre diabetes:          5.7%-6.4%  Diabetes:              >6.4%  Glycemic control for   <7.0% adults with diabetes     CBG: Recent Labs  Lab 01/17/21 1023  GLUCAP 210*    Recent Results (from the past 240 hour(s))  Resp Panel by RT-PCR (Flu A&B, Covid) Nasopharyngeal Swab     Status: None   Collection Time: 01/17/21 10:25 AM   Specimen: Nasopharyngeal Swab; Nasopharyngeal(NP) swabs in vial transport medium  Result Value Ref Range Status   SARS Coronavirus 2 by RT PCR NEGATIVE NEGATIVE Final    Comment: (NOTE) SARS-CoV-2 target nucleic acids are NOT DETECTED.  The SARS-CoV-2 RNA is generally detectable in upper respiratory specimens during the acute phase of infection. The lowest concentration of SARS-CoV-2 viral copies this assay can detect is 138 copies/mL. A negative result does not preclude SARS-Cov-2 infection and should not be used as the sole basis for treatment or other patient management decisions. A negative result may occur with  improper specimen collection/handling, submission of specimen other than nasopharyngeal swab, presence of viral mutation(s) within the areas targeted by this assay, and inadequate number of viral copies(<138 copies/mL). A negative result must be combined with clinical observations, patient history, and epidemiological information. The expected result is Negative.  Fact Sheet for Patients:  EntrepreneurPulse.com.au  Fact Sheet for Healthcare Providers:  IncredibleEmployment.be  This test is no t yet approved or cleared by the [place] FDA and  has been authorized for detection and/or diagnosis of SARS-CoV-2 by FDA under an Emergency Use Authorization (EUA). This EUA will remain  in effect (meaning this test can be used) for the duration of the COVID-19 declaration under Section 564(b)(1) of the  Act, 21 U.S.C.section 360bbb-3(b)(1), unless the authorization is terminated  or revoked sooner.       Influenza A by PCR NEGATIVE NEGATIVE Final   Influenza B by PCR NEGATIVE NEGATIVE Final    Comment: (NOTE) The Xpert Xpress SARS-CoV-2/FLU/RSV plus assay is intended as an aid in the diagnosis of influenza from Nasopharyngeal swab specimens and should not be used as a sole basis for treatment. Nasal washings and aspirates are unacceptable for Xpert Xpress SARS-CoV-2/FLU/RSV testing.  Fact Sheet for Patients: EntrepreneurPulse.com.au  Fact Sheet for Healthcare Providers: IncredibleEmployment.be  This test is not yet approved or cleared by the [place] FDA and has been authorized for detection and/or diagnosis of SARS-CoV-2 by FDA under an Emergency Use Authorization (EUA). This EUA will remain in effect (meaning this test can be used) for the duration  of the COVID-19 declaration under Section 564(b)(1) of the Act, 21 U.S.C. section 360bbb-3(b)(1), unless the authorization is terminated or revoked.  Performed at [facility], [address] [place], [place] [address]      Scheduled Meds:  apixaban  5 mg Oral BID   atorvastatin  80 mg Oral QHS   citalopram  40 mg Oral Daily   famotidine  40 mg Oral QHS   fenofibrate  160 mg Oral Daily   ferrous sulfate  325 mg Oral Daily   furosemide  40 mg Intravenous Q6H   gabapentin  600 mg Oral BID   ipratropium-albuterol  3 mL Nebulization QID   irbesartan  75 mg Oral Daily   levothyroxine  75 mcg Oral QAC breakfast   metFORMIN  500 mg Oral BID WC   methylPREDNISolone (SOLU-MEDROL) injection  40 mg Intravenous Q8H   Followed by   Derrill Memo ON 01/21/2021] predniSONE  40 mg Oral Q breakfast   nicotine  21 mg Transdermal Daily   pantoprazole  40 mg Oral BID   senna  1 tablet Oral BID   sodium chloride flush  3 mL Intravenous Q12H   tiotropium  1 capsule Inhalation Daily   Continuous  Infusions:  sodium chloride       LOS: 1 day   Cherene Altes, MD [facility] Office  [phone] Pager - Text Page per Amion  If 7PM-7AM, please contact night-coverage per Amion 01/18/2021, 10:14 AM

## 2021-01-19 LAB — COMPREHENSIVE METABOLIC PANEL
ALT: 14 U/L (ref 0–44)
AST: 15 U/L (ref 15–41)
Albumin: 3.4 g/dL — ABNORMAL LOW (ref 3.5–5.0)
Alkaline Phosphatase: 32 U/L — ABNORMAL LOW (ref 38–126)
Anion gap: 8 (ref 5–15)
BUN: 42 mg/dL — ABNORMAL HIGH (ref 8–23)
CO2: 34 mmol/L — ABNORMAL HIGH (ref 22–32)
Calcium: 9 mg/dL (ref 8.9–10.3)
Chloride: 97 mmol/L — ABNORMAL LOW (ref 98–111)
Creatinine, Ser: 1.67 mg/dL — ABNORMAL HIGH (ref 0.44–1.00)
GFR, Estimated: 33 mL/min — ABNORMAL LOW (ref >60)
Glucose, Bld: 160 mg/dL — ABNORMAL HIGH (ref 70–99)
Potassium: 4.4 mmol/L (ref 3.5–5.1)
Sodium: 139 mmol/L (ref 135–145)
Total Bilirubin: 0.6 mg/dL (ref 0.3–1.2)
Total Protein: 6.6 g/dL (ref 6.5–8.1)

## 2021-01-19 LAB — CBC
HCT: 32.8 % — ABNORMAL LOW (ref 36.0–46.0)
Hemoglobin: 10.2 g/dL — ABNORMAL LOW (ref 12.0–15.0)
MCH: 28.4 pg (ref 26.0–34.0)
MCHC: 31.1 g/dL (ref 30.0–36.0)
MCV: 91.4 fL (ref 80.0–100.0)
Platelets: 295 10*3/uL (ref 150–400)
RBC: 3.59 MIL/uL — ABNORMAL LOW (ref 3.87–5.11)
RDW: 14.7 % (ref 11.5–15.5)
WBC: 10.2 10*3/uL (ref 4.0–10.5)
nRBC: 0 % (ref 0.0–0.2)

## 2021-01-19 LAB — GLUCOSE, CAPILLARY
Glucose-Capillary: 193 mg/dL — ABNORMAL HIGH (ref 70–99)
Glucose-Capillary: 167 mg/dL — ABNORMAL HIGH (ref 70–99)

## 2021-01-19 LAB — MAGNESIUM: Magnesium: 2.1 mg/dL (ref 1.7–2.4)

## 2021-01-19 MED ORDER — INSULIN ASPART 100 UNIT/ML IJ SOLN
0.0000 [IU] | Freq: Three times a day (TID) | INTRAMUSCULAR | Status: DC
  Administered 2021-01-19: 2 [IU] via SUBCUTANEOUS
  Administered 2021-01-20: 3 [IU] via SUBCUTANEOUS
  Administered 2021-01-20 – 2021-01-21 (×3): 2 [IU] via SUBCUTANEOUS
  Filled 2021-01-19 (×5): qty 1

## 2021-01-19 MED ORDER — ROPINIROLE HCL 0.25 MG PO TABS
0.2500 mg | ORAL_TABLET | Freq: Every day | ORAL | Status: DC
  Administered 2021-01-19 – 2021-01-20 (×2): 0.25 mg via ORAL
  Filled 2021-01-19 (×3): qty 1

## 2021-01-19 MED ORDER — INSULIN ASPART 100 UNIT/ML IJ SOLN: 0.0000 [IU] | Freq: Every day | INTRAMUSCULAR | Status: DC

## 2021-01-19 NOTE — Progress Notes (Signed)
Kristin [name]  YBO:[id number] DOB: 05-20-1950 DOA: 01/17/2021 PCP: [facility]    Brief Narrative:  70 year old with a history of CAD, hypothyroidism, morbid obesity, COPD, non-small cell lung cancer, atrial fibrillation/flutter, DM2, and diastolic CHF who presented to the ED via EMS with severe shortness of breath.  She was found at home confused with sats in the 60s on room air.  She was placed on CPAP and transported to the ED.  At the time of her arrival she remained significantly lethargic.  With transitioning to BiPAP she rapidly improved and became responsive.  Consultants:  None  Code Status: FULL CODE  Antimicrobials:  None  DVT prophylaxis: Eliquis  Subjective: No acute events recorded overnight.  Afebrile.  Vital signs stable.  Developed an acute exacerbation of dyspnea this afternoon requiring return to use of BiPAP.  Tolerates BiPAP very well.  And able to converse with me while on BiPAP.  Denies chest pain nausea vomiting or abdominal pain.  Assessment & Plan:  Acute on chronic hypoxic and hypercarbic respiratory failure Responded favorably to BiPAP -appears to be related to an exacerbation of COPD with active wheezing -continue steroids and inhaled medications -wean oxygen as able  Acute exacerbation of chronic diastolic CHF Diurese as able and follow closely -climbing BUN and creatinine limiting further diuresis  Filed Weights   01/17/21 1024 01/18/21 0441 01/19/21 0500  Weight: 99.8 kg 107.4 kg 107.3 kg     Paroxysmal atrial fibrillation Heart rate presently controlled -continue usual Eliquis dosing  DM2 Monitor CBG with SSI  HTN Blood pressure reasonably controlled at this time  Hypothyroidism TSH at goal  Lung cancer Status post 5 XRT treatments -CT 8/31 noted right upper lobe nodule decreased in size but did note new mediastinal lymphadenopathy and new small node at right upper lobe fissure - follow-up as outpatient with oncology  Obesity -  Body mass index is 43.27 kg/m.    Family Communication: Spoke with daughter at bedside Status is: Inpatient  Remains inpatient appropriate because:Inpatient level of care appropriate due to severity of illness  Dispo: The patient is from: Home              Anticipated d/c is to:  Unclear              Patient currently is not medically stable to d/c.   Difficult to place patient No     Objective: Blood pressure (!) 137/56, pulse 81, temperature 98.4 F (36.9 C), resp. rate 19, height 5\' 2"  (1.575 m), weight 107.3 kg, SpO2 92 %.  Intake/Output Summary (Last 24 hours) at 01/19/2021 1142 Last data filed at 01/19/2021 0950 Gross per 24 hour  Intake 480 ml  Output 2600 ml  Net -2120 ml    Filed Weights   01/17/21 1024 01/18/21 0441 01/19/21 0500  Weight: 99.8 kg 107.4 kg 107.3 kg    Examination: General: Tolerating BiPAP well at time of my visit -not in extremis Lungs: Distant breath sounds throughout - no focal crackles - blunting of breath sounds in bilateral bases and mild expiratory wheezing diffusely Cardiovascular: Regular rate without murmur  Abdomen: Nontender, nondistended, soft, bowel sounds positive, no rebound, obese Extremities: 1+ bilateral lower extremity edema  CBC: Recent Labs  Lab 01/17/21 1018 01/19/21 0529  WBC 11.4* 10.2  NEUTROABS 7.0  --   HGB 12.0 10.2*  HCT 39.8 32.8*  MCV 96.6 91.4  PLT 342 277    Basic Metabolic Panel: Recent Labs  Lab 01/17/21  1018 01/19/21 0529  NA 143 139  K 4.0 4.4  CL 101 97*  CO2 34* 34*  GLUCOSE 213* 160*  BUN 21 42*  CREATININE 1.38* 1.67*  CALCIUM 9.2 9.0  MG 3.3* 2.1    GFR: Estimated Creatinine Clearance: 37.2 mL/min (A) (by C-G formula based on SCr of 1.67 mg/dL (H)).  Liver Function Tests: Recent Labs  Lab 01/17/21 1018 01/19/21 0529  AST 20 15  ALT 15 14  ALKPHOS 40 32*  BILITOT 0.6 0.6  PROT 7.7 6.6  ALBUMIN 3.7 3.4*     HbA1C: Hgb A1c MFr Bld  Date/Time Value Ref Range Status   12/23/2020 05:09 AM 5.4 4.8 - 5.6 % Final    Comment:    (NOTE) Pre diabetes:          5.7%-6.4%  Diabetes:              >6.4%  Glycemic control for   <7.0% adults with diabetes   09/26/2020 01:26 AM 6.2 (H) 4.8 - 5.6 % Final    Comment:    (NOTE) Pre diabetes:          5.7%-6.4%  Diabetes:              >6.4%  Glycemic control for   <7.0% adults with diabetes     CBG: Recent Labs  Lab 01/17/21 1023  GLUCAP 210*     Recent Results (from the past 240 hour(s))  Blood culture (single)     Status: None (Preliminary result)   Collection Time: 01/17/21 10:18 AM   Specimen: BLOOD  Result Value Ref Range Status   Specimen Description BLOOD LEFT ANTECUBITAL  Final   Special Requests   Final    BOTTLES DRAWN AEROBIC AND ANAEROBIC Blood Culture results may not be optimal due to an excessive volume of blood received in culture bottles   Culture   Final    NO GROWTH 2 DAYS Performed at [facility], [address] [place], [place] [address]    Report Status PENDING  Incomplete  Resp Panel by RT-PCR (Flu A&B, Covid) Nasopharyngeal Swab     Status: None   Collection Time: 01/17/21 10:25 AM   Specimen: Nasopharyngeal Swab; Nasopharyngeal(NP) swabs in vial transport medium  Result Value Ref Range Status   SARS Coronavirus 2 by RT PCR NEGATIVE NEGATIVE Final    Comment: (NOTE) SARS-CoV-2 target nucleic acids are NOT DETECTED.  The SARS-CoV-2 RNA is generally detectable in upper respiratory specimens during the acute phase of infection. The lowest concentration of SARS-CoV-2 viral copies this assay can detect is 138 copies/mL. A negative result does not preclude SARS-Cov-2 infection and should not be used as the sole basis for treatment or other patient management decisions. A negative result may occur with  improper specimen collection/handling, submission of specimen other than nasopharyngeal swab, presence of viral mutation(s) within the areas targeted by this  assay, and inadequate number of viral copies(<138 copies/mL). A negative result must be combined with clinical observations, patient history, and epidemiological information. The expected result is Negative.  Fact Sheet for Patients:  EntrepreneurPulse.com.au  Fact Sheet for Healthcare Providers:  IncredibleEmployment.be  This test is no t yet approved or cleared by the [place] FDA and  has been authorized for detection and/or diagnosis of SARS-CoV-2 by FDA under an Emergency Use Authorization (EUA). This EUA will remain  in effect (meaning this test can be used) for the duration of the COVID-19 declaration under Section 564(b)(1) of  the Act, 21 U.S.C.section 360bbb-3(b)(1), unless the authorization is terminated  or revoked sooner.       Influenza A by PCR NEGATIVE NEGATIVE Final   Influenza B by PCR NEGATIVE NEGATIVE Final    Comment: (NOTE) The Xpert Xpress SARS-CoV-2/FLU/RSV plus assay is intended as an aid in the diagnosis of influenza from Nasopharyngeal swab specimens and should not be used as a sole basis for treatment. Nasal washings and aspirates are unacceptable for Xpert Xpress SARS-CoV-2/FLU/RSV testing.  Fact Sheet for Patients: EntrepreneurPulse.com.au  Fact Sheet for Healthcare Providers: IncredibleEmployment.be  This test is not yet approved or cleared by the [place] FDA and has been authorized for detection and/or diagnosis of SARS-CoV-2 by FDA under an Emergency Use Authorization (EUA). This EUA will remain in effect (meaning this test can be used) for the duration of the COVID-19 declaration under Section 564(b)(1) of the Act, 21 U.S.C. section 360bbb-3(b)(1), unless the authorization is terminated or revoked.  Performed at [facility], [address] [place], [place] [address]       Scheduled Meds:  apixaban  5 mg Oral BID   atorvastatin  80 mg Oral  QHS   citalopram  40 mg Oral Daily   famotidine  40 mg Oral QHS   fenofibrate  160 mg Oral Daily   furosemide  80 mg Intravenous Q12H   gabapentin  600 mg Oral BID   ipratropium-albuterol  3 mL Nebulization Q6H   irbesartan  75 mg Oral Daily   levothyroxine  75 mcg Oral QAC breakfast   metFORMIN  500 mg Oral BID WC   methylPREDNISolone (SOLU-MEDROL) injection  40 mg Intravenous Q8H   Followed by   Derrill Memo ON 01/21/2021] predniSONE  40 mg Oral Q breakfast   nicotine  21 mg Transdermal Daily   nystatin   Topical TID   pantoprazole  40 mg Oral BID   senna  1 tablet Oral BID   sodium chloride flush  3 mL Intravenous Q12H   tiotropium  1 capsule Inhalation Daily   Continuous Infusions:  sodium chloride       LOS: 2 days   Cherene Altes, MD [facility] Office  [phone] Pager - Text Page per Shea Evans  If 7PM-7AM, please contact night-coverage per Amion 01/19/2021, 11:42 AM

## 2021-01-19 NOTE — TOC Initial Note (Addendum)
Transition of Care [facility]) - Initial/Assessment Note    Patient Details  Name: Kristin [name] MRN: [id number] Date of Birth: May 24, 1950  [facility] CM/SW Contact:    Alberteen Sam, LCSW Phone Number: 01/19/2021, 12:37 PM  Clinical Narrative:                  CSW met with patient and her daughter [name] at bedside to complete readmission risk assessment.   The report patient lives at home with her daughter [name] who assists in caring for her, as she was previously a CNA. Reports they are active with [facility] as well for home therapy services.   PCP is through [facility], reports [facility] [place].   Patient reports she has O2 and nebulizer at home set up through [facility].   Reports she would like to get a 3in1 and walker delivered to her house prior to discharge. CSW has requested orders from MD. CSW spoke with [facility] with [employer] and made order for 3in1 and walker to be delivered to home, accepted and being processed.   No other discharge needs identified at this time.     Expected Discharge Plan: [place] Barriers to Discharge: Continued Medical Work up   Patient Goals and CMS Choice Patient states their goals for this hospitalization and ongoing recovery are:: to go home CMS Medicare.gov Compare Post Acute Care list provided to:: Patient Choice offered to / list presented to : Patient  Expected Discharge Plan and Services Expected Discharge Plan: [place] Choice: [place] arrangements for the past 2 months: [place]                 DME Arranged: 3-N-1, [place] Rile DME Agency: [employer] Date DME Agency Contacted: 01/19/21 Time DME Agency Contacted: [phone] Representative spoke with at DME Agency: [place]: PT, OT, RN [place] Agency: [place]        Prior Living Arrangements/Services Living arrangements for  the past 2 months: [place] Lives with:: Adult Children Patient language and need for interpreter reviewed:: Yes Do you feel safe going back to the place where you live?: Yes      Need for Family Participation in Patient Care: Yes (Comment) Care giver support system in place?: Yes (comment)   Criminal Activity/Legal Involvement Pertinent to Current Situation/Hospitalization: No - Comment as needed  Activities of Daily Living Home Assistive Devices/Equipment: CPAP, Cane (specify quad or straight), Walker (specify type) ADL Screening (condition at time of admission) Patient's cognitive ability adequate to safely complete daily activities?: Yes Is the patient deaf or have difficulty hearing?: No Does the patient have difficulty seeing, even when wearing glasses/contacts?: No Does the patient have difficulty concentrating, remembering, or making decisions?: No Patient able to express need for assistance with ADLs?: Yes Does the patient have difficulty dressing or bathing?: Yes Independently performs ADLs?: No Does the patient have difficulty walking or climbing stairs?: Yes Weakness of Legs: Both Weakness of Arms/Hands: None  Permission Sought/Granted Permission sought to share information with : Case Manager, Customer service manager, Family Supports Permission granted to share information with : Yes, Verbal Permission Granted     Permission granted to share info w AGENCY: HH/DME        Emotional Assessment Appearance:: Appears stated age Attitude/Demeanor/Rapport: Gracious Affect (typically observed): Calm Orientation: : Oriented to  Self, Oriented to Place, Oriented to  Time, Oriented to Situation Alcohol / Substance Use: Not Applicable Psych Involvement: No (comment)  Admission diagnosis:  Shortness of breath [R06.02] Acute on chronic diastolic heart failure (HCC) [I50.33] COPD exacerbation (HCC) [J44.1] Acute on chronic respiratory failure (HCC)  [J96.20] Acute respiratory failure with hypoxia and hypercapnia (HCC) [J96.01, J96.02] Patient Active Problem List   Diagnosis Date Noted   Acute on chronic respiratory failure ([place]) 01/17/2021   Junctional rhythm 01/02/2021   Acute on chronic renal failure ([place]) 01/02/2021   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   Weakness    Acute hypoxemic respiratory failure ([place]) 12/23/2020   Acute exacerbation of chronic obstructive pulmonary disease (COPD) ([place]) 12/22/2020   Aspiration into airway    PAF (paroxysmal atrial fibrillation) (HCC)    CAD (coronary artery disease) 09/26/2020   Hyperkalemia 09/26/2020   CHF exacerbation (HCC) 09/26/2020   Acute on chronic respiratory failure with hypoxia and hypercapnia (HCC) 09/25/2020   Bradycardia 09/25/2020   Lung cancer ([place]) 05/26/2020   Atrial flutter with rapid ventricular response ([place]) 05/26/2020   Demand ischemia (HCC)    Acute on chronic heart failure with preserved ejection fraction (HFpEF) ([place])    Aortic stenosis-moderate to severe 03/2020    Obesity, Class III, BMI 40-49.9 (morbid obesity) ([place]) 03/29/2020   Chronic respiratory failure with hypoxia (HCC) 03/29/2020   OSA (obstructive sleep apnea) 03/29/2020   Essential hypertension 03/29/2020   Hypothyroidism 03/29/2020   Chest pain 03/29/2020   Elevated troponin 03/29/2020   Chronic diastolic (congestive) heart failure ([place]) 07/12/2018   Type 2 diabetes mellitus without complication ([place]) 81/44/8185   COPD exacerbation ([place]) 07/12/2018   Tobacco use 07/12/2018   Hypotension 07/12/2018   Respiratory failure ([place]) 07/04/2018   Morbid obesity due to excess calories ([place]) 02/08/2016   PCP:  [place] [facility]   [id number] [place] [id number]: [phone]: [phone]    Social Determinants of Health (SDOH) Interventions    Readmission Risk  Interventions Readmission Risk Prevention Plan 01/02/2021 01/01/2021 12/24/2020  Transportation Screening Complete Complete Complete  Medication Review (Press photographer Complete Complete Complete  PCP or Specialist appointment within 3-5 days of discharge Complete - Complete  HRI or Home Care Consult Complete Complete Complete  SW Recovery Care/Counseling Consult Complete Complete Complete  Palliative Care Screening Not Applicable - Not Applicable  Skilled Nursing Facility Not Applicable - Not Applicable  Some recent data might be hidden

## 2021-01-19 NOTE — Progress Notes (Signed)
TOC consulted for heart failure screen, heart failure RN Jimsey notified.   [place], [place]

## 2021-01-20 LAB — CBC
HCT: 30.8 % — ABNORMAL LOW (ref 36.0–46.0)
Hemoglobin: 9.9 g/dL — ABNORMAL LOW (ref 12.0–15.0)
MCH: 29.6 pg (ref 26.0–34.0)
MCHC: 32.1 g/dL (ref 30.0–36.0)
MCV: 92.2 fL (ref 80.0–100.0)
Platelets: 284 10*3/uL (ref 150–400)
RBC: 3.34 MIL/uL — ABNORMAL LOW (ref 3.87–5.11)
RDW: 14.6 % (ref 11.5–15.5)
WBC: 8.9 10*3/uL (ref 4.0–10.5)
nRBC: 0 % (ref 0.0–0.2)

## 2021-01-20 LAB — GLUCOSE, CAPILLARY
Glucose-Capillary: 170 mg/dL — ABNORMAL HIGH (ref 70–99)
Glucose-Capillary: 168 mg/dL — ABNORMAL HIGH (ref 70–99)
Glucose-Capillary: 241 mg/dL — ABNORMAL HIGH (ref 70–99)
Glucose-Capillary: 169 mg/dL — ABNORMAL HIGH (ref 70–99)
Glucose-Capillary: 165 mg/dL — ABNORMAL HIGH (ref 70–99)

## 2021-01-20 LAB — BASIC METABOLIC PANEL
Anion gap: 9 (ref 5–15)
BUN: 53 mg/dL — ABNORMAL HIGH (ref 8–23)
CO2: 35 mmol/L — ABNORMAL HIGH (ref 22–32)
Calcium: 8.8 mg/dL — ABNORMAL LOW (ref 8.9–10.3)
Chloride: 94 mmol/L — ABNORMAL LOW (ref 98–111)
Creatinine, Ser: 1.68 mg/dL — ABNORMAL HIGH (ref 0.44–1.00)
GFR, Estimated: 33 mL/min — ABNORMAL LOW (ref >60)
Glucose, Bld: 164 mg/dL — ABNORMAL HIGH (ref 70–99)
Potassium: 4.4 mmol/L (ref 3.5–5.1)
Sodium: 138 mmol/L (ref 135–145)

## 2021-01-20 LAB — MAGNESIUM: Magnesium: 2 mg/dL (ref 1.7–2.4)

## 2021-01-20 MED ORDER — CHLORHEXIDINE GLUCONATE 0.12 % MT SOLN
15.0000 mL | Freq: Two times a day (BID) | OROMUCOSAL | Status: DC
  Administered 2021-01-20 – 2021-01-21 (×2): 15 mL via OROMUCOSAL
  Filled 2021-01-20: qty 15

## 2021-01-20 MED ORDER — ORAL CARE MOUTH RINSE
15.0000 mL | Freq: Two times a day (BID) | OROMUCOSAL | Status: DC
  Administered 2021-01-21: 15 mL via OROMUCOSAL

## 2021-01-20 NOTE — Consult Note (Addendum)
   Heart Failure Nurse Navigator Note  HFpEF 45to 50%.     Normal right ventricular systolic function, mild bi atrial enlargement.  She states at home she had felt shortness of breath and chest tightness.  Comorbidities:  Asthma Ongoing tobacco abuse COPD with chronic use of O2 Obstructive sleep apnea with use of CPAP Hypertension Hyperlipidemia Type 2 diabetes Iron deficiency anemia Chronic anxiety/depression Paroxysmal atrial fibrillation on anticoagulation  Labs:  Sodium 141, potassium 3.9, chloride 91, CO2 42, BUN 24, creatinine 1.06, BNP 1805 Weight 143.1 down from 152.2 kg Intake 120 mL Output 3050 mL   Met with patient today she is awake and alert lying in bed.  She had last been discharged on August 11.  She states since being home just prior to admission she had noticed a gradual 2 pound weight gain.  Also the day before admission she ate a chicken sandwich from [employer] which when we looked it up on the Internet contains 819 mg of sodium.   she states that she had also earlier in that day had broccoli soup that was from a plastic container, and was unaware of the sodium content in the soup.  Discussed limiting intake to 2000 mg of sodium a day.  Discussed reading labels.  She states that she does not drink over the 64 ounces allotted in the daytime.  She was given the dietary handout heart healthy-reduced sodium nutrition therapy, discussed the various sections.  He was also given the heart failure and salt how to eat a low sodium diet handout.  She had no further questions.  She states that she is being discharged home tomorrow, told her that I would touch base with her and if she had any questions or write them down and we would discuss them. She has an appointment to see Darylene Price in the outpatient heart failure clinic on September 9 at 2 PM.  Pricilla Riffle RN [facility]

## 2021-01-20 NOTE — Progress Notes (Addendum)
Progress Note    [name]  LTJ:[id number] DOB: 13-Dec-1950  DOA: 01/17/2021 PCP: [facility]      Brief Narrative:    Medical records reviewed and are as summarized below:  Kristin [name] is a 70 y.o. female with a history of CAD, hypothyroidism, morbid obesity, COPD, non-small cell lung cancer, atrial fibrillation/flutter, DM2, CKD stage 3b, chronic diastolic CHF, who presented to the ED via EMS with severe shortness of breath.  She was found at home confused with sats in the 60s on room air.  She was placed on CPAP and transported to the ED.  At the time of her arrival she remained significantly lethargic.  With transitioning to BiPAP she rapidly improved and became responsive.      Assessment/Plan:   Active Problems:   Type 2 diabetes mellitus without complication (HCC)   COPD exacerbation (HCC)   OSA (obstructive sleep apnea)   Essential hypertension   Hypothyroidism   Acute on chronic heart failure with preserved ejection fraction (HFpEF) (HCC)   Lung cancer (HCC)   Acute on chronic respiratory failure with hypoxia and hypercapnia (HCC)   Acute on chronic respiratory failure (HCC)    Body mass index is 43.15 kg/m.  (Morbid obesity)   Acute on chronic hypoxic and hypercarbic respiratory failure: Improved.  Continue to this plan monitor oxygen via nasal cannula and BiPAP at night.  Acute exacerbation of chronic diastolic CHF: Resume torsemide.  Monitor BMP closely.  Paroxysmal atrial fibrillation: Continue Eliquis  Type II DM: NovoLog as needed for hyperglycemia.  Monitor glucose levels closely.  CKD stage IIIb: It appears that her creatinine is at baseline.  Repeat BMP tomorrow.  History of lung cancer s/p 5 radiation treatments: New mediastinal lymphadenopathy and new small Right upper lobe fissure noted on CT chest on 01/14/2021.  Outpatient follow-up with oncologist recommended.        Diet Order             Diet heart healthy/carb  modified Room service appropriate? Yes; Fluid consistency: Thin  Diet effective now                      Consultants: None  Procedures: None    Medications:    apixaban  5 mg Oral BID   atorvastatin  80 mg Oral QHS   citalopram  40 mg Oral Daily   famotidine  40 mg Oral QHS   fenofibrate  160 mg Oral Daily   gabapentin  600 mg Oral BID   insulin aspart  0-5 Units Subcutaneous QHS   insulin aspart  0-9 Units Subcutaneous TID WC   ipratropium-albuterol  3 mL Nebulization Q6H   irbesartan  75 mg Oral Daily   levothyroxine  75 mcg Oral QAC breakfast   metFORMIN  500 mg Oral BID WC   methylPREDNISolone (SOLU-MEDROL) injection  40 mg Intravenous Q8H   Followed by   Derrill Memo ON 01/21/2021] predniSONE  40 mg Oral Q breakfast   nicotine  21 mg Transdermal Daily   nystatin   Topical TID   pantoprazole  40 mg Oral BID   rOPINIRole  0.25 mg Oral QHS   senna  1 tablet Oral BID   sodium chloride flush  3 mL Intravenous Q12H   tiotropium  1 capsule Inhalation Daily   Continuous Infusions:  sodium chloride       Anti-infectives (From admission, onward)    Start  Dose/Rate Route Frequency Ordered Stop   01/18/21 1400  fluconazole (DIFLUCAN) tablet 100 mg  Status:  Discontinued        100 mg Oral Daily 01/18/21 1310 01/18/21 1325              Family Communication/Anticipated D/C date and plan/Code Status   DVT prophylaxis:  apixaban (ELIQUIS) tablet 5 mg     Code Status: Full Code  Family Communication: None Disposition Plan:    Status is: Inpatient  Remains inpatient appropriate because:Inpatient level of care appropriate due to severity of illness  Dispo: The patient is from: Home              Anticipated d/c is to: Home              Patient currently is not medically stable to d/c.   Difficult to place patient No           Subjective:   Interval events noted.  C/o SOB with exertion she said she does not feel back to her baseline.  She  wants to stay in the hospital for another day.  Objective:    Vitals:   01/20/21 0000 01/20/21 0400 01/20/21 0500 01/20/21 0818  BP: (!) 120/56 (!) 104/50  (!) 154/60  Pulse: 60 60  73  Resp:  13  20  Temp: 98.2 F (36.8 C) 98.5 F (36.9 C)  97.9 F (36.6 C)  TempSrc:  Oral  Oral  SpO2: 99% 96%  97%  Weight:   107 kg   Height:       No data found.   Intake/Output Summary (Last 24 hours) at 01/20/2021 1004 Last data filed at 01/19/2021 1830 Gross per 24 hour  Intake 490 ml  Output 1000 ml  Net -510 ml   Filed Weights   01/18/21 0441 01/19/21 0500 01/20/21 0500  Weight: 107.4 kg 107.3 kg 107 kg    Exam:  GEN: NAD SKIN: No rash EYES: EOMI ENT: MMM CV: RRR PULM: CTA B ABD: soft, obese, NT, +BS CNS: AAO x 3, non focal EXT: No edema or tenderness        Data Reviewed:   I have personally reviewed following labs and imaging studies:  Labs: Labs show the following:   Basic Metabolic Panel: Recent Labs  Lab 01/17/21 1018 01/19/21 0529 01/20/21 0548  NA 143 139 138  K 4.0 4.4 4.4  CL 101 97* 94*  CO2 34* 34* 35*  GLUCOSE 213* 160* 164*  BUN 21 42* 53*  CREATININE 1.38* 1.67* 1.68*  CALCIUM 9.2 9.0 8.8*  MG 3.3* 2.1 2.0   GFR Estimated Creatinine Clearance: 36.9 mL/min (A) (by C-G formula based on SCr of 1.68 mg/dL (H)). Liver Function Tests: Recent Labs  Lab 01/17/21 1018 01/19/21 0529  AST 20 15  ALT 15 14  ALKPHOS 40 32*  BILITOT 0.6 0.6  PROT 7.7 6.6  ALBUMIN 3.7 3.4*   No results for input(s): LIPASE, AMYLASE in the last 168 hours. No results for input(s): AMMONIA in the last 168 hours. Coagulation profile No results for input(s): INR, PROTIME in the last 168 hours.  CBC: Recent Labs  Lab 01/17/21 1018 01/19/21 0529 01/20/21 0548  WBC 11.4* 10.2 8.9  NEUTROABS 7.0  --   --   HGB 12.0 10.2* 9.9*  HCT 39.8 32.8* 30.8*  MCV 96.6 91.4 92.2  PLT 342 295 284   Cardiac Enzymes: No results for input(s): CKTOTAL, CKMB, CKMBINDEX,  TROPONINI in the  last 168 hours. BNP (last 3 results) No results for input(s): PROBNP in the last 8760 hours. CBG: Recent Labs  Lab 01/17/21 1023 01/19/21 1601 01/19/21 2202 01/20/21 0819  GLUCAP 210* 193* 167* 170*   D-Dimer: No results for input(s): DDIMER in the last 72 hours. Hgb A1c: No results for input(s): HGBA1C in the last 72 hours. Lipid Profile: No results for input(s): CHOL, HDL, LDLCALC, TRIG, CHOLHDL, LDLDIRECT in the last 72 hours. Thyroid function studies: Recent Labs    01/17/21 1156  TSH 0.802   Anemia work up: No results for input(s): VITAMINB12, FOLATE, FERRITIN, TIBC, IRON, RETICCTPCT in the last 72 hours. Sepsis Labs: Recent Labs  Lab 01/17/21 1018 01/18/21 0605 01/19/21 0529 01/20/21 0548  PROCALCITON <0.10 0.53  --   --   WBC 11.4*  --  10.2 8.9  LATICACIDVEN 1.2  --   --   --     Microbiology Recent Results (from the past 240 hour(s))  Blood culture (single)     Status: None (Preliminary result)   Collection Time: 01/17/21 10:18 AM   Specimen: BLOOD  Result Value Ref Range Status   Specimen Description BLOOD LEFT ANTECUBITAL  Final   Special Requests   Final    BOTTLES DRAWN AEROBIC AND ANAEROBIC Blood Culture results may not be optimal due to an excessive volume of blood received in culture bottles   Culture   Final    NO GROWTH 3 DAYS Performed at [facility], [address], [place],  [address]    Report Status PENDING  Incomplete  Resp Panel by RT-PCR (Flu A&B, Covid) Nasopharyngeal Swab     Status: None   Collection Time: 01/17/21 10:25 AM   Specimen: Nasopharyngeal Swab; Nasopharyngeal(NP) swabs in vial transport medium  Result Value Ref Range Status   SARS Coronavirus 2 by RT PCR NEGATIVE NEGATIVE Final    Comment: (NOTE) SARS-CoV-2 target nucleic acids are NOT DETECTED.  The SARS-CoV-2 RNA is generally detectable in upper respiratory specimens during the acute phase of infection. The lowest concentration  of SARS-CoV-2 viral copies this assay can detect is 138 copies/mL. A negative result does not preclude SARS-Cov-2 infection and should not be used as the sole basis for treatment or other patient management decisions. A negative result may occur with  improper specimen collection/handling, submission of specimen other than nasopharyngeal swab, presence of viral mutation(s) within the areas targeted by this assay, and inadequate number of viral copies(<138 copies/mL). A negative result must be combined with clinical observations, patient history, and epidemiological information. The expected result is Negative.  Fact Sheet for Patients:  EntrepreneurPulse.com.au  Fact Sheet for Healthcare Providers:  IncredibleEmployment.be  This test is no t yet approved or cleared by the [place] FDA and  has been authorized for detection and/or diagnosis of SARS-CoV-2 by FDA under an Emergency Use Authorization (EUA). This EUA will remain  in effect (meaning this test can be used) for the duration of the COVID-19 declaration under Section 564(b)(1) of the Act, 21 U.S.C.section 360bbb-3(b)(1), unless the authorization is terminated  or revoked sooner.       Influenza A by PCR NEGATIVE NEGATIVE Final   Influenza B by PCR NEGATIVE NEGATIVE Final    Comment: (NOTE) The Xpert Xpress SARS-CoV-2/FLU/RSV plus assay is intended as an aid in the diagnosis of influenza from Nasopharyngeal swab specimens and should not be used as a sole basis for treatment. Nasal washings and aspirates are unacceptable for Xpert Xpress SARS-CoV-2/FLU/RSV testing.  Fact Sheet for Patients: EntrepreneurPulse.com.au  Fact Sheet for Healthcare Providers: IncredibleEmployment.be  This test is not yet approved or cleared by the [place] FDA and has been authorized for detection and/or diagnosis of SARS-CoV-2 by FDA under an Emergency Use  Authorization (EUA). This EUA will remain in effect (meaning this test can be used) for the duration of the COVID-19 declaration under Section 564(b)(1) of the Act, 21 U.S.C. section 360bbb-3(b)(1), unless the authorization is terminated or revoked.  Performed at [facility], [address] [place], [place] [address]     Procedures and diagnostic studies:  No results found.             LOS: 3 days   Katelee Schupp  [facility]   Pager on [email or web address]. If 7PM-7AM, please contact night-coverage at [email or web address]     01/20/2021, 10:04 AM

## 2021-01-21 DIAGNOSIS — E039 Hypothyroidism, unspecified: Secondary | ICD-10-CM

## 2021-01-21 DIAGNOSIS — G4733 Obstructive sleep apnea (adult) (pediatric): Secondary | ICD-10-CM

## 2021-01-21 LAB — GLUCOSE, CAPILLARY
Glucose-Capillary: 151 mg/dL — ABNORMAL HIGH (ref 70–99)
Glucose-Capillary: 139 mg/dL — ABNORMAL HIGH (ref 70–99)

## 2021-01-21 MED ORDER — NYSTATIN 100000 UNIT/GM EX POWD: Freq: Three times a day (TID) | CUTANEOUS | 0 refills | Status: AC

## 2021-01-21 MED ORDER — PREDNISONE 20 MG PO TABS: 40.0000 mg | ORAL_TABLET | Freq: Every day | ORAL | 0 refills | Status: DC

## 2021-01-21 NOTE — Care Management Important Message (Signed)
Important Message  Patient Details  Name: Kristin [name] MRN: [id number] Date of Birth: Jun 01, 1950   Medicare Important Message Given:  Yes     Dannette Barbara 01/21/2021, 11:31 AM

## 2021-01-21 NOTE — Progress Notes (Signed)
Pt. Asked if she could take the BIPAP mask home. I told her the mask was incompatible with her home CPAP unit. After discharge I found the mask had been taken off the bipap  machine.

## 2021-01-21 NOTE — TOC Transition Note (Signed)
Transition of Care [facility]) - CM/SW Discharge Note   Patient Details  Name: Erisa Mehlman Mccleod MRN: [id number] Date of Birth: 01/31/1951  [facility] CM/SW Contact:  Alberteen Sam, LCSW Phone Number: 01/21/2021, 1:14 PM   Clinical Narrative:     Patient to discharge today home with home health services, was already active with [facility]. CSW lvm with Adriana with [facility] to inform of patient's dc today. Patient has all Oxygen equipment at home through [facility], and all her DME was delivered to her home via [employer].   No other discharge needs at this time.    Final next level of care: [place] Barriers to Discharge: No Barriers Identified   Patient Goals and CMS Choice Patient states their goals for this hospitalization and ongoing recovery are:: to go home CMS Medicare.gov Compare Post Acute Care list provided to:: Patient Choice offered to / list presented to : Patient  Discharge Placement                    Patient and family notified of of transfer: 01/21/21  Discharge Plan and Services     Post Acute Care Choice: Home Health          DME Arranged: 3-N-1, [place]ford Rile DME Agency: [employer] Date DME Agency Contacted: 01/19/21 Time DME Agency Contacted: 33 Representative spoke with at DME Agency: [place]: PT, OT, RN [place] Agency: [place]        Social Determinants of Health ([place]) Interventions     Readmission Risk Interventions Readmission Risk Prevention Plan 01/19/2021 01/02/2021 01/01/2021  Transportation Screening Complete Complete Complete  Medication Review Press photographer) Complete Complete Complete  PCP or Specialist appointment within 3-5 days of discharge Complete Complete -  [place] or Home Care Consult Complete Complete Complete  SW Recovery Care/Counseling Consult Complete Complete Complete  Palliative Care Screening Not Applicable Not Applicable -  [place] Not  Applicable Not Applicable -  Some recent data might be hidden

## 2021-01-21 NOTE — Discharge Summary (Signed)
Physician Discharge Summary  Kristin [name] IWL:[id number] DOB: August 21, 1950 DOA: 01/17/2021  PCP: [facility]  Admit date: 01/17/2021 Discharge date: 01/21/2021  Admitted From: Home Disposition: Home  Recommendations for Outpatient Follow-up:  Follow up with PCP in 1-2 weeks Follow-up with oncology. Please obtain BMP/CBC in one week Please follow up on the following pending results: None  Home Health: Yes Equipment/Devices: Home oxygen, BiPAP at night Discharge Condition: Stable CODE STATUS: Full Diet recommendation: Heart Healthy / Carb Modified   Brief/Interim Summary: Kristin [name] is a 70 y.o. female with a history of CAD, hypothyroidism, morbid obesity, COPD, non-small cell lung cancer, atrial fibrillation/flutter, DM2, CKD stage 3b, chronic diastolic CHF, who presented to the ED via EMS with severe shortness of breath.  She was found at home confused with sats in the 60s on room air.  She was placed on CPAP and transported to the ED.  At the time of her arrival she remained significantly lethargic.  With transitioning to BiPAP she rapidly improved and became responsive. Admitted for acute on chronic hypoxic and hypercapnic respiratory failure.  Currently stable at her baseline oxygen requirement of 3 L.  She was advised to continue using BiPAP at night. She was also given steroid for COPD exacerbation, will continue for another 4 days. Patient has an history of obstructive sleep apnea. She also has stage III obesity with BMI of 42.89. Patient has an history of lung cancer s/p radiation.  There was new mediastinal lymphadenopathy and a new small right upper lobe lesion noted on CT done on 01/14/2021.  Patient will follow-up with her oncologist for further recommendations.  She will continue the rest of her home medications and follow-up with her providers.  Discharge Diagnoses:  Active Problems:   Type 2 diabetes mellitus without complication (HCC)   COPD exacerbation (HCC)    OSA (obstructive sleep apnea)   Essential hypertension   Hypothyroidism   Acute on chronic heart failure with preserved ejection fraction (HFpEF) (HCC)   Lung cancer (HCC)   Acute on chronic respiratory failure with hypoxia and hypercapnia (HCC)   Acute on chronic respiratory failure [facility])   Discharge Instructions  Discharge Instructions     Diet - low sodium heart healthy   Complete by: As directed    Discharge instructions   Complete by: As directed    It was pleasure taking care of you. Continue using your oxygen during the day and BiPAP at night. Follow-up with your oncologist regarding further plan of treating your cancer.   Increase activity slowly   Complete by: As directed       Allergies as of 01/21/2021       Reactions   Altace [ramipril] Swelling        Medication List     TAKE these medications    albuterol 108 (90 Base) MCG/ACT inhaler Commonly known as: VENTOLIN HFA Inhale 2 puffs into the lungs every 4 (four) hours as needed for wheezing or shortness of breath.   apixaban 5 MG Tabs tablet Commonly known as: ELIQUIS Take 1 tablet (5 mg total) by mouth 2 (two) times daily.   atorvastatin 80 MG tablet Commonly known as: LIPITOR Take 1 tablet (80 mg total) by mouth at bedtime.   citalopram 40 MG tablet Commonly known as: CELEXA Take 40 mg by mouth daily.   docusate sodium 100 MG capsule Commonly known as: COLACE Take 100 mg by mouth at bedtime as needed for mild constipation or moderate constipation.  famotidine 40 MG tablet Commonly known as: PEPCID Take 40 mg by mouth at bedtime.   fenofibrate 160 MG tablet Take 160 mg by mouth daily.   ferrous sulfate 325 (65 FE) MG tablet Take 325 mg by mouth daily.   gabapentin 600 MG tablet Commonly known as: NEURONTIN Take 600 mg by mouth 2 (two) times daily.   hydrOXYzine 50 MG capsule Commonly known as: VISTARIL Take 50 mg by mouth every 6 (six) hours as needed for anxiety.    ipratropium-albuterol 0.5-2.5 (3) MG/3ML Soln Commonly known as: DUONEB Take 3 mLs by nebulization 4 (four) times daily.   irbesartan 75 MG tablet Commonly known as: Avapro Take 1 tablet (75 mg total) by mouth daily.   levothyroxine 75 MCG tablet Commonly known as: SYNTHROID Take 75 mcg by mouth daily before breakfast.   lidocaine 5 % Commonly known as: LIDODERM Place 3 patches onto the skin daily. (Remove after 12 hours and keep off for 12 hours)   Melatonin 10 MG Tabs Take 10 mg by mouth at bedtime as needed (sleep).   metFORMIN 500 MG tablet Commonly known as: GLUCOPHAGE Take 500 mg by mouth 2 (two) times daily.   nicotine 21 mg/24hr patch Commonly known as: NICODERM CQ - dosed in mg/24 hours Place 1 patch (21 mg total) onto the skin daily.   nystatin powder Commonly known as: MYCOSTATIN/NYSTOP Apply topically 3 (three) times daily.   oxyCODONE-acetaminophen 10-325 MG tablet Commonly known as: PERCOCET Take 1 tablet by mouth 4 (four) times daily as needed for pain.   pantoprazole 40 MG tablet Commonly known as: PROTONIX Take 40 mg by mouth 2 (two) times daily.   predniSONE 20 MG tablet Commonly known as: DELTASONE Take 2 tablets (40 mg total) by mouth daily with breakfast for 4 days.   Tiotropium Bromide Monohydrate 2.5 MCG/ACT Aers Inhale 2 puffs into the lungs daily.   torsemide 20 MG tablet Commonly known as: DEMADEX Take 1 tablet (20 mg total) by mouth daily. Alternating with 20 mg by mouth every other day What changed: additional instructions   Vitamin D (Ergocalciferol) 1.25 MG (50000 UNIT) Caps capsule Commonly known as: DRISDOL Take 50,000 Units by mouth once a week. Mondays               Durable Medical Equipment  (From admission, onward)           Start     Ordered   01/19/21 1246  For home use only DME 3 n 1  Once        01/19/21 1246   01/19/21 1246  For home use only DME Walker rolling  Once       Question Answer Comment   Walker: With 5 Inch Wheels   Patient needs a walker to treat with the following condition Physical deconditioning      01/19/21 1246            [place] Schedule an appointment as soon as possible for a visit in 1 week(s).   Contact information: [place] [address] [phone]         Minna Merritts, MD .   Specialty: Cardiology Contact information: [place] [address] [phone]                Allergies  Allergen Reactions   Altace [Ramipril] Swelling    Consultations: None  Procedures/Studies: CT CHEST WO  CONTRAST  Result Date: 01/15/2021 CLINICAL DATA:  70 year old female with non-small cell lung cancer, post radiotherapy for a RIGHT lower lobe nodule. EXAM: CT CHEST WITHOUT CONTRAST TECHNIQUE: Multidetector CT imaging of the chest was performed following the standard protocol without IV contrast. COMPARISON:  Comparison made with November twenty-third 2021 cardiac CT. FINDINGS: Cardiovascular: Calcified aortic atherosclerosis. Three-vessel coronary artery disease. Mitral annular calcification. Heart size stable without substantial pericardial fluid. Central pulmonary vasculature normal caliber. Limited assessment of cardiovascular structures given lack of intravenous contrast. Mediastinum/Nodes: Bulky adenopathy has developed since the previous study along the RIGHT paratracheal chain (image 46/2) 2.1 cm RIGHT paratracheal lymph node scattered small lymph nodes were present in this location previously, potentially as large as 14 mm. This is contiguous with lymph nodes tracking towards the RIGHT hilum. (Image 58/2) 15 mm subcarinal lymph node previously approximately a cm. Esophagus grossly normal. No axillary lymphadenopathy. No gross hilar adenopathy. Lungs/Pleura: 15 x 11 mm pulmonary nodule in the superior segment of the RIGHT lower lobe with irregular margins previously 18 x  14 mm with surrounding post treatment changes. Small RIGHT upper lobe pulmonary nodule along the fissure measuring 2 mm (image 43/5) Signs of pulmonary emphysema as before. Airways are patent. No effusion. Upper Abdomen: Cholelithiasis. Liver is incompletely imaged. Imaged portions of pancreas, spleen, adrenal glands and gastrointestinal tract unremarkable. 3.7 cm LEFT upper pole intermediate density well-circumscribed renal lesion is incompletely evaluated. More likely a hemorrhagic or proteinaceous cyst based on comparison with prior imaging. Musculoskeletal: Spinal degenerative changes. No acute or destructive bone process. IMPRESSION: Slight interval decrease in size of RIGHT upper lobe pulmonary nodule with surrounding post treatment changes. However, new mediastinal nodal enlargement suspicious for nodal involvement. PET scan may be helpful for further evaluation as warranted. Small 2 mm RIGHT upper lobe pulmonary nodule along the fissure. Not definitely seen previously, suggest attention on follow-up. 3.7 cm LEFT upper pole intermediate density well-circumscribed renal lesion is incompletely evaluated. More likely a hemorrhagic or proteinaceous cyst based on comparison with prior imaging. Consider dedicated renal MRI or renal protocol CT as warranted. This has been present since 2017 and is enlarged since that time. Attention on upcoming imaging if PET is acquired may also help to guide further management. Three-vessel coronary artery disease. Cholelithiasis. Emphysema and aortic atherosclerosis. Electronically Signed   By: Zetta Bills M.D.   On: 01/15/2021 16:02   US RENAL  Result Date: 01/01/2021 CLINICAL DATA:  Acute kidney injury EXAM: RENAL / URINARY TRACT ULTRASOUND COMPLETE COMPARISON:  CT 04/08/2020 FINDINGS: Right Kidney: Renal measurements: 9.8 x 5 x 5.2 cm = volume: 134 mL. Echogenicity within normal limits. No mass or hydronephrosis visualized. Left Kidney: Renal measurements: 10.8 x 5.9 x  4.8 cm = volume: 159 mL. Echogenicity within normal limits. No hydronephrosis. Upper pole cyst measuring 4.6 x 3.8 x 3.7 cm. Bladder: Appears normal for degree of bladder distention. Other: None. IMPRESSION: 1. Negative for hydronephrosis. 2. Simple cyst in the left kidney. Electronically Signed   By: Donavan Foil M.D.   On: 01/01/2021 21:49   DG Chest Port 1 View  Result Date: 01/18/2021 CLINICAL DATA:  Chronic diastolic heart failure, respiratory failure, COPD, asthma EXAM: PORTABLE CHEST 1 VIEW COMPARISON:  Chest radiograph from one day prior. FINDINGS: Stable cardiomediastinal silhouette with mild cardiomegaly. No pneumothorax. No pleural effusion. Borderline mild pulmonary edema, slightly improved. No acute consolidative airspace disease. IMPRESSION: Borderline mild congestive heart failure, slightly improved. Electronically Signed   By: Janina Mayo.D.  On: 01/18/2021 07:11   DG Chest Portable 1 View  Result Date: 01/17/2021 CLINICAL DATA:  Shortness of breath. EXAM: PORTABLE CHEST 1 VIEW COMPARISON:  Chest radiograph 12/30/2020. FINDINGS: Monitoring leads overlie the patient. Stable cardiomegaly. Aortic atherosclerosis. Diffuse bilateral interstitial pulmonary opacities. No pleural effusion or pneumothorax. Thoracic spine degenerative changes. IMPRESSION: Findings most compatible with cardiomegaly and interstitial edema. Some of the background of interstitial changes are likely chronic in etiology. Electronically Signed   By: Lovey Newcomer M.D.   On: 01/17/2021 10:46   DG Chest Port 1 View  Result Date: 12/30/2020 CLINICAL DATA:  Hypotension fatigue EXAM: PORTABLE CHEST 1 VIEW COMPARISON:  12/22/2020, CT 04/08/2020, radiograph 05/29/2020, 09/25/2020 FINDINGS: Cardiomegaly with vascular congestion. Diffuse increased interstitial opacity some of which is felt chronic. No pleural effusion or pneumothorax. Aortic atherosclerosis IMPRESSION: 1. Cardiomegaly with vascular congestion. 2. Diffuse  increased interstitial opacity likely due to chronic interstitial changes. No acute superimposed confluent airspace disease Electronically Signed   By: Donavan Foil M.D.   On: 12/30/2020 18:25    Subjective: Patient was seen and examined today.  No new complaints.  Continue to have some productive cough.  She thinks that she is at her baseline and wants to go home.  Had her oncology appointment tomorrow morning.  Discharge Exam: Vitals:   01/21/21 0823 01/21/21 1216  BP: 138/79 (!) 169/57  Pulse: 62 67  Resp: 18 18  Temp: 97.7 F (36.5 C) 97.9 F (36.6 C)  SpO2: 98% 96%   Vitals:   01/21/21 0400 01/21/21 0823 01/21/21 1013 01/21/21 1216  BP: (!) 106/48 138/79  (!) 169/57  Pulse: (!) 55 62  67  Resp: 13 18  18   Temp: (!) 97.3 F (36.3 C) 97.7 F (36.5 C)  97.9 F (36.6 C)  TempSrc: Axillary     SpO2: 97% 98%  96%  Weight:   106.4 kg   Height:        General: Pt is alert, awake, not in acute distress Cardiovascular: RRR, S1/S2 +, no rubs, no gallops Respiratory: CTA bilaterally, no wheezing, no rhonchi Abdominal: Soft, NT, ND, bowel sounds + Extremities: no edema, no cyanosis   The results of significant diagnostics from this hospitalization (including imaging, microbiology, ancillary and laboratory) are listed below for reference.    Microbiology: Recent Results (from the past 240 hour(s))  Blood culture (single)     Status: None (Preliminary result)   Collection Time: 01/17/21 10:18 AM   Specimen: BLOOD  Result Value Ref Range Status   Specimen Description BLOOD LEFT ANTECUBITAL  Final   Special Requests   Final    BOTTLES DRAWN AEROBIC AND ANAEROBIC Blood Culture results may not be optimal due to an excessive volume of blood received in culture bottles   Culture   Final    NO GROWTH 4 DAYS Performed at [facility], [address], [place], [place] [address]    Report Status PENDING  Incomplete  Resp Panel by RT-PCR (Flu A&B, Covid) Nasopharyngeal  Swab     Status: None   Collection Time: 01/17/21 10:25 AM   Specimen: Nasopharyngeal Swab; Nasopharyngeal(NP) swabs in vial transport medium  Result Value Ref Range Status   SARS Coronavirus 2 by RT PCR NEGATIVE NEGATIVE Final    Comment: (NOTE) SARS-CoV-2 target nucleic acids are NOT DETECTED.  The SARS-CoV-2 RNA is generally detectable in upper respiratory specimens during the acute phase of infection. The lowest concentration of SARS-CoV-2 viral copies this assay can detect is  138 copies/mL. A negative result does not preclude SARS-Cov-2 infection and should not be used as the sole basis for treatment or other patient management decisions. A negative result may occur with  improper specimen collection/handling, submission of specimen other than nasopharyngeal swab, presence of viral mutation(s) within the areas targeted by this assay, and inadequate number of viral copies(<138 copies/mL). A negative result must be combined with clinical observations, patient history, and epidemiological information. The expected result is Negative.  Fact Sheet for Patients:  EntrepreneurPulse.com.au  Fact Sheet for Healthcare Providers:  IncredibleEmployment.be  This test is no t yet approved or cleared by the [place] FDA and  has been authorized for detection and/or diagnosis of SARS-CoV-2 by FDA under an Emergency Use Authorization (EUA). This EUA will remain  in effect (meaning this test can be used) for the duration of the COVID-19 declaration under Section 564(b)(1) of the Act, 21 U.S.C.section 360bbb-3(b)(1), unless the authorization is terminated  or revoked sooner.       Influenza A by PCR NEGATIVE NEGATIVE Final   Influenza B by PCR NEGATIVE NEGATIVE Final    Comment: (NOTE) The Xpert Xpress SARS-CoV-2/FLU/RSV plus assay is intended as an aid in the diagnosis of influenza from Nasopharyngeal swab specimens and should not be used as a sole  basis for treatment. Nasal washings and aspirates are unacceptable for Xpert Xpress SARS-CoV-2/FLU/RSV testing.  Fact Sheet for Patients: EntrepreneurPulse.com.au  Fact Sheet for Healthcare Providers: IncredibleEmployment.be  This test is not yet approved or cleared by the [place] FDA and has been authorized for detection and/or diagnosis of SARS-CoV-2 by FDA under an Emergency Use Authorization (EUA). This EUA will remain in effect (meaning this test can be used) for the duration of the COVID-19 declaration under Section 564(b)(1) of the Act, 21 U.S.C. section 360bbb-3(b)(1), unless the authorization is terminated or revoked.  Performed at [facility], [address] [place], [place] [address]      Labs: BNP (last 3 results) Recent Labs    12/30/20 2017 01/17/21 1018 01/18/21 0605  BNP 204.5* 475.6* 242.6*   Basic Metabolic Panel: Recent Labs  Lab 01/17/21 1018 01/19/21 0529 01/20/21 0548  NA 143 139 138  K 4.0 4.4 4.4  CL 101 97* 94*  CO2 34* 34* 35*  GLUCOSE 213* 160* 164*  BUN 21 42* 53*  CREATININE 1.38* 1.67* 1.68*  CALCIUM 9.2 9.0 8.8*  MG 3.3* 2.1 2.0   Liver Function Tests: Recent Labs  Lab 01/17/21 1018 01/19/21 0529  AST 20 15  ALT 15 14  ALKPHOS 40 32*  BILITOT 0.6 0.6  PROT 7.7 6.6  ALBUMIN 3.7 3.4*   No results for input(s): LIPASE, AMYLASE in the last 168 hours. No results for input(s): AMMONIA in the last 168 hours. CBC: Recent Labs  Lab 01/17/21 1018 01/19/21 0529 01/20/21 0548  WBC 11.4* 10.2 8.9  NEUTROABS 7.0  --   --   HGB 12.0 10.2* 9.9*  HCT 39.8 32.8* 30.8*  MCV 96.6 91.4 92.2  PLT 342 295 284   Cardiac Enzymes: No results for input(s): CKTOTAL, CKMB, CKMBINDEX, TROPONINI in the last 168 hours. BNP: Invalid input(s): POCBNP CBG: Recent Labs  Lab 01/20/21 1622 01/20/21 1930 01/20/21 2127 01/21/21 0821 01/21/21 1153  GLUCAP 241* 169* 165* 151* 139*    D-Dimer No results for input(s): DDIMER in the last 72 hours. Hgb A1c No results for input(s): HGBA1C in the last 72 hours. Lipid Profile No results for input(s): CHOL, HDL, LDLCALC,  TRIG, CHOLHDL, LDLDIRECT in the last 72 hours. Thyroid function studies No results for input(s): TSH, T4TOTAL, T3FREE, THYROIDAB in the last 72 hours.  Invalid input(s): FREET3 Anemia work up No results for input(s): VITAMINB12, FOLATE, FERRITIN, TIBC, IRON, RETICCTPCT in the last 72 hours. Urinalysis    Component Value Date/Time   COLORURINE STRAW (A) 12/30/2020 2017   APPEARANCEUR CLEAR (A) 12/30/2020 2017   LABSPEC 1.009 12/30/2020 2017   PHURINE 6.0 12/30/2020 2017   GLUCOSEU NEGATIVE 12/30/2020 2017   HGBUR NEGATIVE 12/30/2020 2017   BILIRUBINUR NEGATIVE 12/30/2020 2017   KETONESUR NEGATIVE 12/30/2020 2017   PROTEINUR NEGATIVE 12/30/2020 2017   NITRITE NEGATIVE 12/30/2020 2017   LEUKOCYTESUR NEGATIVE 12/30/2020 2017   Sepsis Labs Invalid input(s): PROCALCITONIN,  WBC,  LACTICIDVEN Microbiology Recent Results (from the past 240 hour(s))  Blood culture (single)     Status: None (Preliminary result)   Collection Time: 01/17/21 10:18 AM   Specimen: BLOOD  Result Value Ref Range Status   Specimen Description BLOOD LEFT ANTECUBITAL  Final   Special Requests   Final    BOTTLES DRAWN AEROBIC AND ANAEROBIC Blood Culture results may not be optimal due to an excessive volume of blood received in culture bottles   Culture   Final    NO GROWTH 4 DAYS Performed at [facility], [address], [place], [place] [address]    Report Status PENDING  Incomplete  Resp Panel by RT-PCR (Flu A&B, Covid) Nasopharyngeal Swab     Status: None   Collection Time: 01/17/21 10:25 AM   Specimen: Nasopharyngeal Swab; Nasopharyngeal(NP) swabs in vial transport medium  Result Value Ref Range Status   SARS Coronavirus 2 by RT PCR NEGATIVE NEGATIVE Final    Comment: (NOTE) SARS-CoV-2 target nucleic acids  are NOT DETECTED.  The SARS-CoV-2 RNA is generally detectable in upper respiratory specimens during the acute phase of infection. The lowest concentration of SARS-CoV-2 viral copies this assay can detect is 138 copies/mL. A negative result does not preclude SARS-Cov-2 infection and should not be used as the sole basis for treatment or other patient management decisions. A negative result may occur with  improper specimen collection/handling, submission of specimen other than nasopharyngeal swab, presence of viral mutation(s) within the areas targeted by this assay, and inadequate number of viral copies(<138 copies/mL). A negative result must be combined with clinical observations, patient history, and epidemiological information. The expected result is Negative.  Fact Sheet for Patients:  EntrepreneurPulse.com.au  Fact Sheet for Healthcare Providers:  IncredibleEmployment.be  This test is no t yet approved or cleared by the [place] FDA and  has been authorized for detection and/or diagnosis of SARS-CoV-2 by FDA under an Emergency Use Authorization (EUA). This EUA will remain  in effect (meaning this test can be used) for the duration of the COVID-19 declaration under Section 564(b)(1) of the Act, 21 U.S.C.section 360bbb-3(b)(1), unless the authorization is terminated  or revoked sooner.       Influenza A by PCR NEGATIVE NEGATIVE Final   Influenza B by PCR NEGATIVE NEGATIVE Final    Comment: (NOTE) The Xpert Xpress SARS-CoV-2/FLU/RSV plus assay is intended as an aid in the diagnosis of influenza from Nasopharyngeal swab specimens and should not be used as a sole basis for treatment. Nasal washings and aspirates are unacceptable for Xpert Xpress SARS-CoV-2/FLU/RSV testing.  Fact Sheet for Patients: EntrepreneurPulse.com.au  Fact Sheet for Healthcare Providers: IncredibleEmployment.be  This test is  not yet approved or cleared by the [place]  FDA and has been authorized for detection and/or diagnosis of SARS-CoV-2 by FDA under an Emergency Use Authorization (EUA). This EUA will remain in effect (meaning this test can be used) for the duration of the COVID-19 declaration under Section 564(b)(1) of the Act, 21 U.S.C. section 360bbb-3(b)(1), unless the authorization is terminated or revoked.  Performed at [facility], [address] [place], [place] [address]     Time coordinating discharge: Over 30 minutes  SIGNED:  Lorella Nimrod, MD  [facility] 01/21/2021, 12:47 PM  If 7PM-7AM, please contact night-coverage [email or web address]  This record has been created using Systems analyst. Errors have been sought and corrected,but may not always be located. Such creation errors do not reflect on the standard of care.

## 2021-01-21 NOTE — Progress Notes (Signed)
Radiation Oncology         [phone] ________________________________  Name: Kristin [name] MRN: [id number]  Date: 01/22/2021  DOB: 1951-02-10  Follow-Up Visit Note  CC: [facility]  Rexene Alberts, MD    ICD-10-CM   1. 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 Since that time, the patient has had multiple hospital admission/ED visits, and is currently inpatient at [facility] due to acute respiratory failure. The patient's recent hospital encounter history is as follows:  -- ED to hospital admissions on 09/25/20 presenting with respiratory distress, she was noted to be tachypneic and treated with BiPAP briefly. She was admitted to the ICU, chest x-ray taken was concerning for pulmonary vascular congestion. Discharge diagnosis was acute on chronic hypoxic resp. failure; secondary to acute on chronic diastolic congestive heart failure with severe aortic stenosis; end-stage COPD with exacerbation; suspected aspiration pneumonia. She was discharged on 10/03/20 -- ED encounter on 10/29/20 presenting with acute onset SOB( waxing and waning) with associated chest tightness. Differential diagnoses included COPD exacerbation, CHF exacerbation, pneumonia, pleural effusion, pneumothorax, viral illness. Patient was accordingly given Solu-Medrol and bronchodilators. Chest-x-ray taken revealed cardiac enlargement and vascular congestion. Improvement was seen in regards to her  bilateral airspace disease.         -- ED to hospital admissions on 12/22/20 presenting with acute respiratory distress.She was accordingly placed on bi-pap and given bronchodilator, IV steroids, and IV lasix. She was discharged home on 12/25/20.  --  ED to hospital admissions on 12/30/20 presenting with acute onset of generalized weakness with associated dizziness and lightheadedness and mild headache. Prior to presenting to the ED, the patient was seen at gastroenterology clinic and was found to be hypotensive and referred to the ER. She was treated accordingly for hypotension and discharged home on 08/19.  --  ED to hospital admissions on 01/17/21 presenting via EMS with respiratory distress and shortness of breath. When EMS arrived to the patients home, she was found to be confused, with sats in the 60s on room air. She was administered IV lasix 60 mg and admitted for further managements of acute on chronic respiratory failure with hypercarbia and acute on chronic HFpEF. Patient has remained inpatient since.           Pertinent imaging since the patient was last seen includes a chest CT on 01/14/21 which demonstrated a slight interval decrease in the size of the right upper lobe pulmonary nodule, with surrounding post treatment changes. However, a new mediastinal nodal enlargement was seen and is suspicious for nodal evolvement. Also seen was a small 2 mm right upper lobe pulmonary nodule along the fissure; not definitively seen previously. A 3.7 cm left upper pole intermediate density renal lesion was noted as incompletely visualized as well; noted as mostly likely a hemorrhagic or proteinaceous cyst based on comparison with prior imaging.        The patient was discharged from [facility] yesterday.  Overall she is breathing better.  She denies any pain within the chest area.  She reports occasional blood-tinged sputum.  She reports some mild intermittent swallowing problems.  Allergies:   is allergic to altace [ramipril].  Meds: Current Outpatient Medications  Medication Sig Dispense Refill   albuterol (VENTOLIN HFA) 108 (90 Base) MCG/ACT inhaler Inhale 2 puffs into the lungs every 4 (four) hours as needed for wheezing or shortness of breath.      apixaban (ELIQUIS) 5 MG TABS tablet Take 1 tablet (5 mg total) by mouth 2 (two) times daily. 60 tablet 11   atorvastatin (LIPITOR) 80 MG tablet Take 1 tablet (80 mg total) by mouth at bedtime. 90 tablet 3   citalopram (CELEXA) 40 MG tablet Take 40 mg by mouth daily.     docusate sodium (COLACE) 100 MG capsule Take 100 mg by mouth at bedtime as needed for mild constipation or moderate constipation.     famotidine (PEPCID) 40 MG tablet Take 40 mg by mouth at bedtime.     fenofibrate 160 MG tablet Take 160 mg by mouth daily.     ferrous sulfate 325 (65 FE) MG tablet Take 325 mg by mouth daily.     gabapentin (NEURONTIN) 600 MG tablet Take 600 mg by mouth 2 (two) times daily.      hydrOXYzine (VISTARIL) 50 MG capsule Take 50 mg by mouth every 6 (six) hours as needed for anxiety.     ipratropium-albuterol (DUONEB) 0.5-2.5 (3) MG/3ML SOLN Take 3 mLs by nebulization 4 (four) times daily.     irbesartan (AVAPRO) 75 MG tablet Take 1 tablet (75 mg total) by mouth daily. 90 tablet 3   levothyroxine (SYNTHROID) 75 MCG tablet Take 75 mcg by mouth daily before breakfast.     lidocaine (LIDODERM) 5 % Place 3 patches onto the skin daily. (Remove after 12 hours and keep off for 12 hours)     Melatonin 10 MG TABS Take 10 mg by mouth at bedtime as needed (sleep).     metFORMIN (GLUCOPHAGE) 500 MG tablet Take 500 mg by mouth 2 (two) times daily.     nicotine (NICODERM CQ - DOSED IN MG/24 HOURS) 21 mg/24hr patch Place 1 patch (21 mg total) onto the skin daily. 28 patch 5   nystatin (MYCOSTATIN/NYSTOP) powder Apply topically 3 (three) times daily. 15 g 0   oxyCODONE-acetaminophen (PERCOCET) 10-325 MG tablet Take 1 tablet by mouth 4 (four) times daily as  needed for pain.     pantoprazole (PROTONIX) 40 MG tablet Take 40 mg by mouth 2 (two) times daily.     predniSONE (DELTASONE) 20 MG tablet Take 2 tablets (40 mg total) by mouth daily with breakfast for 4 days. 8 tablet 0   Tiotropium Bromide Monohydrate 2.5 MCG/ACT AERS Inhale 2 puffs into the lungs daily.      torsemide (DEMADEX) 20 MG tablet Take 1 tablet (20 mg total) by mouth daily. Alternating with 20 mg by mouth every other day (Patient taking differently: Take 20 mg by mouth daily.) 90 tablet 1   Vitamin D, Ergocalciferol, (DRISDOL) 1.25 MG (50000 UNIT) CAPS capsule Take 50,000 Units by mouth once a week. Mondays     No current facility-administered medications for this encounter.    Physical Findings: The patient is in no acute distress. Patient is alert and oriented.  Sitting comfortably in wheelchair  height is 5\' 2"  (1.575 m) and weight is 233 lb 2 oz (105.7 kg). Her temporal temperature is 97  F (36.1 C) (abnormal). Her blood pressure is 110/70 and her pulse is 123 (abnormal). Her respiration is 22 (abnormal) and oxygen saturation is 95%. .  No significant changes. Lungs are clear to auscultation bilaterally. Heart has regular rate and rhythm. No palpable cervical, supraclavicular, or axillary adenopathy. Abdomen soft, non-tender, normal bowel sounds.  Supplemental oxygen in place.   Lab Findings: Lab Results  Component Value Date   WBC 8.9 01/20/2021   HGB 9.9 (L) 01/20/2021   HCT 30.8 (L) 01/20/2021   MCV 92.2 01/20/2021   PLT 284 01/20/2021    Radiographic Findings: CT CHEST WO CONTRAST  Result Date: 01/15/2021 CLINICAL DATA:  70 year old female with non-small cell lung cancer, post radiotherapy for a RIGHT lower lobe nodule. EXAM: CT CHEST WITHOUT CONTRAST TECHNIQUE: Multidetector CT imaging of the chest was performed following the standard protocol without IV contrast. COMPARISON:  Comparison made with November twenty-third 2021 cardiac CT. FINDINGS: Cardiovascular:  Calcified aortic atherosclerosis. Three-vessel coronary artery disease. Mitral annular calcification. Heart size stable without substantial pericardial fluid. Central pulmonary vasculature normal caliber. Limited assessment of cardiovascular structures given lack of intravenous contrast. Mediastinum/Nodes: Bulky adenopathy has developed since the previous study along the RIGHT paratracheal chain (image 46/2) 2.1 cm RIGHT paratracheal lymph node scattered small lymph nodes were present in this location previously, potentially as large as 14 mm. This is contiguous with lymph nodes tracking towards the RIGHT hilum. (Image 58/2) 15 mm subcarinal lymph node previously approximately a cm. Esophagus grossly normal. No axillary lymphadenopathy. No gross hilar adenopathy. Lungs/Pleura: 15 x 11 mm pulmonary nodule in the superior segment of the RIGHT lower lobe with irregular margins previously 18 x 14 mm with surrounding post treatment changes. Small RIGHT upper lobe pulmonary nodule along the fissure measuring 2 mm (image 43/5) Signs of pulmonary emphysema as before. Airways are patent. No effusion. Upper Abdomen: Cholelithiasis. Liver is incompletely imaged. Imaged portions of pancreas, spleen, adrenal glands and gastrointestinal tract unremarkable. 3.7 cm LEFT upper pole intermediate density well-circumscribed renal lesion is incompletely evaluated. More likely a hemorrhagic or proteinaceous cyst based on comparison with prior imaging. Musculoskeletal: Spinal degenerative changes. No acute or destructive bone process. IMPRESSION: Slight interval decrease in size of RIGHT upper lobe pulmonary nodule with surrounding post treatment changes. However, new mediastinal nodal enlargement suspicious for nodal involvement. PET scan may be helpful for further evaluation as warranted. Small 2 mm RIGHT upper lobe pulmonary nodule along the fissure. Not definitely seen previously, suggest attention on follow-up. 3.7 cm LEFT upper pole  intermediate density well-circumscribed renal lesion is incompletely evaluated. More likely a hemorrhagic or proteinaceous cyst based on comparison with prior imaging. Consider dedicated renal MRI or renal protocol CT as warranted. This has been present since 2017 and is enlarged since that time. Attention on upcoming imaging if PET is acquired may also help to guide further management. Three-vessel coronary artery disease. Cholelithiasis. Emphysema and aortic atherosclerosis. Electronically Signed   By: Zetta Bills M.D.   On: 01/15/2021 16:02   US RENAL  Result Date: 01/01/2021 CLINICAL DATA:  Acute kidney injury EXAM: RENAL / URINARY TRACT ULTRASOUND COMPLETE COMPARISON:  CT 04/08/2020 FINDINGS: Right Kidney: Renal measurements: 9.8 x 5 x 5.2 cm = volume: 134 mL. Echogenicity within normal limits. No mass or hydronephrosis visualized. Left Kidney: Renal measurements: 10.8 x 5.9 x 4.8 cm = volume: 159 mL. Echogenicity within normal limits. No hydronephrosis. Upper pole cyst measuring 4.6 x 3.8 x 3.7 cm. Bladder: Appears normal for degree of bladder  distention. Other: None. IMPRESSION: 1. Negative for hydronephrosis. 2. Simple cyst in the left kidney. Electronically Signed   By: Donavan Foil M.D.   On: 01/01/2021 21:49   DG Chest Port 1 View  Result Date: 01/18/2021 CLINICAL DATA:  Chronic diastolic heart failure, respiratory failure, COPD, asthma EXAM: PORTABLE CHEST 1 VIEW COMPARISON:  Chest radiograph from one day prior. FINDINGS: Stable cardiomediastinal silhouette with mild cardiomegaly. No pneumothorax. No pleural effusion. Borderline mild pulmonary edema, slightly improved. No acute consolidative airspace disease. IMPRESSION: Borderline mild congestive heart failure, slightly improved. Electronically Signed   By: Ilona Sorrel M.D.   On: 01/18/2021 07:11   DG Chest Portable 1 View  Result Date: 01/17/2021 CLINICAL DATA:  Shortness of breath. EXAM: PORTABLE CHEST 1 VIEW COMPARISON:  Chest  radiograph 12/30/2020. FINDINGS: Monitoring leads overlie the patient. Stable cardiomegaly. Aortic atherosclerosis. Diffuse bilateral interstitial pulmonary opacities. No pleural effusion or pneumothorax. Thoracic spine degenerative changes. IMPRESSION: Findings most compatible with cardiomegaly and interstitial edema. Some of the background of interstitial changes are likely chronic in etiology. Electronically Signed   By: Lovey Newcomer M.D.   On: 01/17/2021 10:46   DG Chest Port 1 View  Result Date: 12/30/2020 CLINICAL DATA:  Hypotension fatigue EXAM: PORTABLE CHEST 1 VIEW COMPARISON:  12/22/2020, CT 04/08/2020, radiograph 05/29/2020, 09/25/2020 FINDINGS: Cardiomegaly with vascular congestion. Diffuse increased interstitial opacity some of which is felt chronic. No pleural effusion or pneumothorax. Aortic atherosclerosis IMPRESSION: 1. Cardiomegaly with vascular congestion. 2. Diffuse increased interstitial opacity likely due to chronic interstitial changes. No acute superimposed confluent airspace disease Electronically Signed   By: Donavan Foil M.D.   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  She has had shrinkage of the tumor treated in the right posterior lung area.  However on recent chest CT scan there is concern for new development of adenopathy in the mediastinum.  I discussed these findings with patient and daughter.  I would recommend a PET scan for further evaluation.  Plan: PET scan ordered for the near future.  This will be performed at [facility] closer to where she lives.  Patient will follow-up with me to review results on this PET scan.  She will follow-up with her primary care provider as recommended from recent hospital admission.   22 minutes of total time was spent for this patient encounter, including  preparation, face-to-face counseling with the patient and coordination of care, physical exam, and documentation of the encounter. ____________________________________  Blair Promise, PhD, MD   This document serves as a record of services personally performed by Gery Pray, MD. It was created on his behalf by Roney Mans, a trained medical scribe. The creation of this record is based on the scribe's personal observations and the provider's statements to them. This document has been checked and approved by the attending provider.

## 2021-01-21 NOTE — Progress Notes (Signed)
Patient alert and oriented x 4. No complaints of pain. No change from previous assessment. She is being discharged home with her baseline oxygen of 3 liters. Her daughter is at bedside and will be transporting her home.

## 2021-01-22 ENCOUNTER — Other Ambulatory Visit: Payer: Self-pay

## 2021-01-22 ENCOUNTER — Ambulatory Visit
Admission: RE | Admit: 2021-01-22 | Discharge: 2021-01-22 | Disposition: A | Discharge status: Home / Self Care | Payer: Medicare Other | Source: Ambulatory Visit | Attending: Radiation Oncology | Admitting: Radiation Oncology

## 2021-01-22 ENCOUNTER — Encounter: Payer: Self-pay | Admitting: Radiation Oncology

## 2021-01-22 ENCOUNTER — Telehealth: Payer: Self-pay | Admitting: *Deleted

## 2021-01-22 DIAGNOSIS — K8020 Calculus of gallbladder without cholecystitis without obstruction: Secondary | ICD-10-CM | POA: Diagnosis not present

## 2021-01-22 DIAGNOSIS — R519 Headache, unspecified: Secondary | ICD-10-CM | POA: Insufficient documentation

## 2021-01-22 DIAGNOSIS — I5033 Acute on chronic diastolic (congestive) heart failure: Secondary | ICD-10-CM | POA: Insufficient documentation

## 2021-01-22 DIAGNOSIS — I700 Atherosclerosis of aorta: Secondary | ICD-10-CM | POA: Diagnosis not present

## 2021-01-22 DIAGNOSIS — Z923 Personal history of irradiation: Secondary | ICD-10-CM | POA: Diagnosis not present

## 2021-01-22 DIAGNOSIS — I959 Hypotension, unspecified: Secondary | ICD-10-CM | POA: Diagnosis not present

## 2021-01-22 DIAGNOSIS — J439 Emphysema, unspecified: Secondary | ICD-10-CM | POA: Insufficient documentation

## 2021-01-22 DIAGNOSIS — Z7901 Long term (current) use of anticoagulants: Secondary | ICD-10-CM | POA: Diagnosis not present

## 2021-01-22 DIAGNOSIS — R42 Dizziness and giddiness: Secondary | ICD-10-CM | POA: Diagnosis not present

## 2021-01-22 DIAGNOSIS — C3411 Malignant neoplasm of upper lobe, right bronchus or lung: Secondary | ICD-10-CM

## 2021-01-22 DIAGNOSIS — C3431 Malignant neoplasm of lower lobe, right bronchus or lung: Secondary | ICD-10-CM | POA: Diagnosis present

## 2021-01-22 DIAGNOSIS — I2510 Atherosclerotic heart disease of native coronary artery without angina pectoris: Secondary | ICD-10-CM | POA: Diagnosis not present

## 2021-01-22 DIAGNOSIS — Z7952 Long term (current) use of systemic steroids: Secondary | ICD-10-CM | POA: Insufficient documentation

## 2021-01-22 DIAGNOSIS — Z79899 Other long term (current) drug therapy: Secondary | ICD-10-CM | POA: Insufficient documentation

## 2021-01-22 DIAGNOSIS — R042 Hemoptysis: Secondary | ICD-10-CM | POA: Diagnosis not present

## 2021-01-22 LAB — CULTURE, BLOOD (SINGLE): Culture: NO GROWTH

## 2021-01-22 NOTE — Progress Notes (Signed)
Kristin [name] is here today for follow up post radiation to the lung.  Lung Side: Right  Does the patient complain of any of the following: Pain:Patient reports having back pain, rating 6/10 and chest discomfort rating 1/10.  Patient taking Oxycodone for pain which is effective.  Shortness of breath w/wo exertion: yes, Patient on oxygen at 3 L via nasal  cannula.  Cough: productive  Hemoptysis: no Pain with swallowing: no Swallowing/choking concerns:Patient reports feeling like she has a lump in her chest.  Appetite: good Energy Level: mild fatigue Post radiation skin Changes: Skin intact.     Additional comments if applicable:   Noted patient heart rate to be 123 at start of visit. Patient reports having A-fib. Heart rated rechecked at end of visit noted to be 91 bpm.   Vitals:   01/22/21 0910  BP: 110/70  Pulse: (!) 123  Resp: (!) 22  Temp: (!) 97 F (36.1 C)  TempSrc: Temporal  SpO2: 95%  Weight: 233 lb 2 oz (105.7 kg)  Height: 5\' 2"  (1.575 m)

## 2021-01-22 NOTE — Telephone Encounter (Signed)
CALLED PATIENT'S DAUGHTER- JUDY MASHBURN TO INFORM OF PET SCAN FOR 02-03-21- ARRIVAL TIME - 8 A,M @ [facility] @ [place]- PATIENT TO BE NPO- 6 HRS. PRIOR TO TEST, PATIENT MAY HAVE WATER, LVM FOR  A RETURN CALL

## 2021-01-23 ENCOUNTER — Encounter: Payer: Self-pay | Admitting: Internal Medicine

## 2021-01-23 ENCOUNTER — Ambulatory Visit (INDEPENDENT_AMBULATORY_CARE_PROVIDER_SITE_OTHER): Payer: Medicare Other | Admitting: Internal Medicine

## 2021-01-23 DIAGNOSIS — J449 Chronic obstructive pulmonary disease, unspecified: Secondary | ICD-10-CM

## 2021-01-23 DIAGNOSIS — F17210 Nicotine dependence, cigarettes, uncomplicated: Secondary | ICD-10-CM

## 2021-01-23 DIAGNOSIS — J9611 Chronic respiratory failure with hypoxia: Secondary | ICD-10-CM

## 2021-01-23 DIAGNOSIS — J9612 Chronic respiratory failure with hypercapnia: Secondary | ICD-10-CM

## 2021-01-23 MED ORDER — STIOLTO RESPIMAT 2.5-2.5 MCG/ACT IN AERS: 2.0000 | INHALATION_SPRAY | Freq: Every day | RESPIRATORY_TRACT | 11 refills | Status: AC

## 2021-01-23 MED ORDER — STIOLTO RESPIMAT 2.5-2.5 MCG/ACT IN AERS: 2.0000 | INHALATION_SPRAY | Freq: Every day | RESPIRATORY_TRACT | 0 refills | Status: DC

## 2021-01-23 MED ORDER — ALBUTEROL SULFATE (2.5 MG/3ML) 0.083% IN NEBU: 2.5000 mg | INHALATION_SOLUTION | RESPIRATORY_TRACT | 12 refills | Status: AC | PRN

## 2021-01-23 NOTE — Assessment & Plan Note (Signed)
HC03  01/20/21 = 35   Advised:  Make sure you check your oxygen saturation  at your highest level of activity  to be sure it stays around  90% and adjust  02 flow upward to maintain this level if needed but remember to turn it back to previous settings when you stop (to conserve your supply).

## 2021-01-23 NOTE — Assessment & Plan Note (Signed)
Counseled re importance of smoking cessation but did not meet time criteria for separate billing           Each maintenance medication was reviewed in detail including emphasizing most importantly the difference between maintenance and prns and under what circumstances the prns are to be triggered using an action plan format where appropriate.  Total time for H and P, chart review, counseling, reviewing hfa/02 device(s) and generating customized AVS unique to this office visit / same day charting = 50 min

## 2021-01-23 NOTE — Assessment & Plan Note (Signed)
Body mass index is 42.07 kg/m.   Lab Results  Component Value Date   TSH 0.802 01/17/2021      Contributing to doe and risk of GERD >>>   reviewed the need and the process to achieve and maintain neg calorie balance > defer f/u primary care including intermittently monitoring thyroid status

## 2021-01-23 NOTE — Progress Notes (Signed)
Kristin [name], female    DOB: November 27, 1950,   MRN: [id number]   Brief patient profile:  72 yowf active smoker referred to pulmonary clinic in [facility]  01/23/2021  p admit    Admit date: 01/17/2021 Discharge date: 01/21/2021       Home Health: Yes Equipment/Devices: Home oxygen, BiPAP at night Discharge Condition: Stable CODE STATUS: Full Diet recommendation: Heart Healthy / Carb Modified    Brief/Interim Summary: Kristin [name] is a 70 y.o. female with a history of CAD, hypothyroidism, morbid obesity, COPD, non-small cell lung cancer, atrial fibrillation/flutter, DM2, CKD stage 3b, chronic diastolic CHF, who presented to the ED via EMS with severe shortness of breath.  She was found at home confused with sats in the 60s on room air.  She was placed on CPAP and transported to the ED.  At the time of her arrival she remained significantly lethargic.  With transitioning to BiPAP she rapidly improved and became responsive. Admitted for acute on chronic hypoxic and hypercapnic respiratory failure.  Currently stable at her baseline oxygen requirement of 3 L.  She was advised to continue using BiPAP at night. She was also given steroid for COPD exacerbation, will continue for another 4 days. Patient has an history of obstructive sleep apnea. She also has stage III obesity with BMI of 42.89. Patient has an history of lung cancer s/p radiation.  There was new mediastinal lymphadenopathy and a new small right upper lobe lesion noted on CT done on 01/14/2021.  Patient will follow-up with her oncologist for further recommendations.   She will continue the rest of her home medications and follow-up with her providers.   Discharge Diagnoses:  Active Problems:   Type 2 diabetes mellitus without complication (HCC)   COPD exacerbation (HCC)   OSA (obstructive sleep apnea)   Essential hypertension   Hypothyroidism   Acute on chronic heart failure with preserved ejection fraction (HFpEF) (HCC)   Lung  cancer (HCC)   Acute on chronic respiratory failure with hypoxia and hypercapnia (HCC)   Acute on chronic respiratory failure ([place])      History of Present Illness  01/23/2021  Pulmonary/ 1st office eval/ Kristin [name] / [employer] on last day of Prednisone 20 mg  Chief Complaint  Patient presents with   Consult    COPD- hospital follow up.   Dyspnea:  walking hallway at home on 3lpm slow pace = MMRC3 = can't walk 100 yards even at a slow pace at a flat grade s stopping due to sob with overt HB  Cough: resolved  Sleep: 30 degrees with pillows and cpap and 3lpm x years the same lately> Dr ziglar  SABA use: neb qid   No obvious day to day or daytime variability or assoc excess/ purulent sputum or mucus plugs or hemoptysis or cp or chest tightness, subjective wheeze or overt sinus  symptoms.   Sleeping as above  without nocturnal  or early am exacerbation  of respiratory  c/o's or need for noct saba. Also denies any obvious fluctuation of symptoms with weather or environmental changes or other aggravating or alleviating factors except as outlined above   No unusual exposure hx or h/o childhood pna/ asthma or knowledge of premature birth.  Current Allergies, Complete Past Medical History, Past Surgical History, Family History, and Social History were reviewed in [employer] record.  ROS  The following are not active complaints unless bolded Hoarseness, sore throat, dysphagia, dental problems, itching, sneezing,  nasal congestion or discharge of excess mucus or purulent secretions, ear ache,   fever, chills, sweats, unintended wt loss or wt gain, classically pleuritic or exertional cp,  orthopnea pnd or arm/hand swelling  or leg swelling, presyncope, palpitations, abdominal pain, anorexia, nausea, vomiting, diarrhea  or change in bowel habits or change in bladder habits, change in stools or change in urine, dysuria, hematuria,  rash, arthralgias, visual complaints, headache,  numbness, weakness or ataxia or problems with walking or coordination,  change in mood or  memory.           Past Medical History:  Diagnosis Date   (HFpEF) heart failure with preserved ejection fraction ([place])    a. 09/2020 Echo: EF >55%; b. 10/2020 Echo: EF 55-60%. RVSP 39mmHg.   Anxiety    Asthma    CAD (coronary artery disease)    a. 03/2020 Cath: LM nl, LAD 20, LCX 60p, 65m/d, RCA 30p, 38m, RPDA 30-->Med Rx.   COPD (chronic obstructive pulmonary disease) (HCC)    Diabetes mellitus without complication (HCC)    Dyspnea    GERD (gastroesophageal reflux disease)    History of hiatal hernia    History of orthopnea    History of radiation therapy 07/29/20-08/08/20   Right Lung, SBRT Dr. Gery Pray   Hypothyroidism    Moderate aortic stenosis    a. 03/2020 Echo: Mod-sev AS. AVA 1.11cm^2. Mean grad 60mmHg; b. 10/2020 Echo: EF 55-60%, no rwma, nl RV fxn, RVSP 43mmHg, mild BAE, mod-sev mitral annular Ca2+ w/o MS/MR. Mod-sev AS. AVA 0.91cm^2 (VTI), mean grad 35mmHg.   Neuropathy    Oxygen deficiency    3L/HS   PAF (paroxysmal atrial fibrillation) ([place])    a. Post-termination pauses. Prev on amio.  CHA2DS2VASc 6-->eliqis.   Pain    BACK/DDD   Peripheral vascular disease (HCC)    RLS (restless legs syndrome)    Sleep apnea    Wheezing     Outpatient Medications Prior to Visit  Medication Sig Dispense Refill   albuterol (VENTOLIN HFA) 108 (90 Base) MCG/ACT inhaler Inhale 2 puffs into the lungs every 4 (four) hours as needed for wheezing or shortness of breath.      apixaban (ELIQUIS) 5 MG TABS tablet Take 1 tablet (5 mg total) by mouth 2 (two) times daily. 60 tablet 11   atorvastatin (LIPITOR) 80 MG tablet Take 1 tablet (80 mg total) by mouth at bedtime. 90 tablet 3   citalopram (CELEXA) 40 MG tablet Take 40 mg by mouth daily.     docusate sodium (COLACE) 100 MG capsule Take 100 mg by mouth at bedtime as needed for mild constipation or moderate constipation.     famotidine (PEPCID) 40  MG tablet Take 40 mg by mouth at bedtime.     fenofibrate 160 MG tablet Take 160 mg by mouth daily.     ferrous sulfate 325 (65 FE) MG tablet Take 325 mg by mouth daily.     gabapentin (NEURONTIN) 600 MG tablet Take 600 mg by mouth 2 (two) times daily.      hydrOXYzine (VISTARIL) 50 MG capsule Take 50 mg by mouth every 6 (six) hours as needed for anxiety.     ipratropium-albuterol (DUONEB) 0.5-2.5 (3) MG/3ML SOLN Take 3 mLs by nebulization 4 (four) times daily.     irbesartan (AVAPRO) 75 MG tablet Take 1 tablet (75 mg total) by mouth daily. 90 tablet 3   levothyroxine (SYNTHROID) 75 MCG tablet Take 75 mcg by mouth daily before breakfast.  lidocaine (LIDODERM) 5 % Place 3 patches onto the skin daily. (Remove after 12 hours and keep off for 12 hours)     Melatonin 10 MG TABS Take 10 mg by mouth at bedtime as needed (sleep).     metFORMIN (GLUCOPHAGE) 500 MG tablet Take 500 mg by mouth 2 (two) times daily.     nicotine (NICODERM CQ - DOSED IN MG/24 HOURS) 21 mg/24hr patch Place 1 patch (21 mg total) onto the skin daily. 28 patch 5   nystatin (MYCOSTATIN/NYSTOP) powder Apply topically 3 (three) times daily. 15 g 0   oxyCODONE-acetaminophen (PERCOCET) 10-325 MG tablet Take 1 tablet by mouth 4 (four) times daily as needed for pain.     pantoprazole (PROTONIX) 40 MG tablet Take 40 mg by mouth 2 (two) times daily.     predniSONE (DELTASONE) 20 MG tablet Take 2 tablets (40 mg total) by mouth daily with breakfast for 4 days. 8 tablet 0   Tiotropium Bromide Monohydrate 2.5 MCG/ACT AERS Inhale 2 puffs into the lungs daily.      torsemide (DEMADEX) 20 MG tablet Take 1 tablet (20 mg total) by mouth daily. Alternating with 20 mg by mouth every other day (Patient taking differently: Take 20 mg by mouth daily.) 90 tablet 1   Vitamin D, Ergocalciferol, (DRISDOL) 1.25 MG (50000 UNIT) CAPS capsule Take 50,000 Units by mouth once a week. Mondays     No facility-administered medications prior to visit.      Objective:     BP (!) 140/50 (BP Location: Right Arm, Patient Position: Sitting, Cuff Size: Normal)   Pulse 72   Temp 99.2 F (37.3 C) (Oral)   Ht 5\' 2"  (1.575 m)   Wt 230 lb (104.3 kg)   LMP  (LMP Unknown)   SpO2 96%   BMI 42.07 kg/m   SpO2: 96 % O2 Type: Continuous O2 O2 Flow Rate (L/min): 3 L/min  Wc bound chronically ill congested cough   HEENT : pt wearing mask not removed for exam due to covid -19 concerns.    NECK :  without JVD/Nodes/TM/ nl carotid upstrokes bilaterally   LUNGS: no acc muscle use,  Mod barrel  contour chest wall with bilateral  Distant bs s audible wheeze and  without cough on insp or exp maneuvers and mod  Hyperresonant  to  percussion bilaterally     CV:  RRR  no s3 or murmur or increase in P2, and no edema   ABD:  obese soft and nontender with pos mid insp Hoover's  in the supine position. No bruits or organomegaly appreciated, bowel sounds nl  MS:     ext warm without deformities, calf tenderness, cyanosis or clubbing No obvious joint restrictions   SKIN: warm and dry without lesions    NEURO:  alert, approp, nl sensorium with  no motor or cerebellar deficits apparent.        I personally reviewed images and agree with radiology impression as follows:  CXR:   portable 01/18/21 Borderline mild congestive heart failure, slightly improved.      Assessment   No problem-specific Assessment & Plan notes found for this encounter.     Christinia Gully, MD 01/23/2021

## 2021-01-23 NOTE — Patient Instructions (Addendum)
Stop all smoking and spiriva and duoneb and all smoking   Take pantoprazole Take 30- 60 min before your first and last meals of the day and pepcid 40 mg at bedtime    Plan A = Automatic = Always=    Stiolto 2 pffs each am   Plan B = Backup (to supplement plan A, not to replace it) Only use your albuterol inhaler as a rescue medication to be used if you can't catch your breath by resting or doing a relaxed purse lip breathing pattern.  - The less you use it, the better it will work when you need it. - Ok to use the inhaler up to 2 puffs  every 4 hours if you must but call for appointment if use goes up over your usual need - Don't leave home without it !!  (think of it like the spare tire for your car)   Plan C = Crisis (instead of Plan B but only if Plan B stops working) - only use your albuterol nebulizer if you first try Plan B and it fails to help > ok to use the nebulizer up to every 4 hours but if start needing it regularly call for immediate appointment   Ok to stop prednisone for now   Make sure you check your oxygen saturation  at your highest level of activity  to be sure it stays over 90% and adjust  02 flow upward to maintain this level if needed but remember to turn it back to previous settings when you stop (to conserve your supply).   Please schedule a follow up office visit in 6 weeks, call sooner if needed

## 2021-01-23 NOTE — Assessment & Plan Note (Signed)
Active smoker - 01/23/2021  After extensive coaching inhaler device,  effectiveness =    75% with SMI > trial of stiolto and max gerd rx  DDX of  difficult airways management almost all start with A and  include Adherence, Ace Inhibitors, Acid Reflux, Active Sinus Disease, Alpha 1 Antitripsin deficiency, Anxiety masquerading as Airways dz,  ABPA,  Allergy(esp in young), Aspiration (esp in elderly), Adverse effects of meds,  Active smoking or vaping, A bunch of PE's (a small clot burden can't cause this syndrome unless there is already severe underlying pulm or vascular dz with poor reserve) plus two Bs  = Bronchiectasis and Beta blocker use..and one C= CHF   Adherence is always the initial "prime suspect" and is a multilayered concern that requires a "trust but verify" approach in every patient - starting with knowing how to use medications, especially inhalers, correctly, keeping up with refills and understanding the fundamental difference between maintenance and prns vs those medications only taken for a very short course and then stopped and not refilled.  - see smi coaching above - return  with all meds in hand using a trust but verify approach to confirm accurate Medication  Reconciliation The principal here is that until we are certain that the  patients are doing what we've asked, it makes no sense to ask them to do more.   Active smoker at the top of the list also  (see separate a/p)   ? Acid (or non-acid) GERD > always difficult to exclude as up to 75% of pts in some series report no assoc GI/ Heartburn symptoms> rec max (24h)  acid suppression and diet restrictions/ reviewed and instructions given in writing.   ? Allergy > doubt it, just needs rx for copd for now and see if flares off prednisone - overuse of saba noted: I spent extra time with pt today reviewing appropriate use of albuterol for prn use on exertion with the following points: 1) saba is for relief of sob that does not improve by  walking a slower pace or resting but rather if the pt does not improve after trying this first. 2) If the pt is convinced, as many are, that saba helps recover from activity faster then it's easy to tell if this is the case by re-challenging : ie stop, take the inhaler, then p 5 minutes try the exact same activity (intensity of workload) that just caused the symptoms and see if they are substantially diminished or not after saba 3) if there is an activity that reproducibly causes the symptoms, try the saba 15 min before the activity on alternate days   If in fact the saba really does help, then fine to continue to use it prn but advised may need to look closer at the maintenance regimen being used to achieve better control of airways disease with exertion.   ? Chf/ cardiac asthma > note bnp well above 500 last flare   >>> no change rx x to use lama/laba per guidelines for copd and see if flares off prednisone in which case change to triple rx like trelegy or Judithann Sauger would be appropriate

## 2021-01-25 NOTE — Progress Notes (Deleted)
Patient ID: Kristin [name], female    DOB: [birth year], 70 y.o.   MRN: [id number]  HPI  Ms Gehres is a 70 y/o female with a history of asthma, DM, thyroid disease, COPD, obstructive sleep apnea, GERD, anxiety, PVD, current tobacco use and chronic heart failure.   Echo report from 10/24/20 reviewed and showed an EF of 55-60% along with moderate LVH, mildly elevated PA pressure and moderate aortic thickening. Echo report from 07/05/2018 reviewed and showed an EF of 60-65% along with moderate AS.   RHC/LHC done 03/31/20 and showed: Prox RCA lesion is 30% stenosed. Mid RCA lesion is 40% stenosed. RPDA lesion is 30% stenosed. Prox Cx lesion is 60% stenosed. Mid LAD lesion is 20% stenosed. Mid Cx to Dist Cx lesion is 20% stenosed.  1.  Mild to moderate nonobstructive coronary artery disease.  Worst stenosis is 60% in the proximal left circumflex.  No evidence of obstructive disease.  Coronary arteries are overall moderately calcified. 2.  Right heart catheterization showed moderately elevated left-sided filling pressures, moderate pulmonary hypertension and moderately reduced cardiac output. 3.  Severe aortic stenosis with mean gradient of 31 mmHg and calculated valve area of 0.7 cm.  Admitted 01/17/21 due to shortness of breath and confusion due to sats in the 60's. Placed on CPAP wit transition to bipap with improvement of mental status. Treated with steroids for COPD exacerbation. Discharged after 4 days. Admitted 12/30/20 due to generalized weakness and dizziness and found to be hypotensive. Cardiology and nephrology consults obtained. Medications adjusted. Discharged after 3 days. Admitted 12/22/20 due to acute on chronic heart failure. Initially placed on bipap and given IV lasix. Given IV steroids and bronchodilators. Elevated troponin thought to be due to demand ischemia. Weaned off bipap to her 3L. Discharged after 3 days.   She presents today for a follow-up visit although hasn't been seen since  2020. She presents today with a chief complaint of  Past Medical History:  Diagnosis Date   (HFpEF) heart failure with preserved ejection fraction ([place])    a. 09/2020 Echo: EF >55%; b. 10/2020 Echo: EF 55-60%. RVSP 12mmHg.   Anxiety    Asthma    CAD (coronary artery disease)    a. 03/2020 Cath: LM nl, LAD 20, LCX 60p, 60m/d, RCA 30p, 69m, RPDA 30-->Med Rx.   COPD (chronic obstructive pulmonary disease) (HCC)    Diabetes mellitus without complication (HCC)    Dyspnea    GERD (gastroesophageal reflux disease)    History of hiatal hernia    History of orthopnea    History of radiation therapy 07/29/20-08/08/20   Right Lung, SBRT Dr. Gery Pray   Hypothyroidism    Moderate aortic stenosis    a. 03/2020 Echo: Mod-sev AS. AVA 1.11cm^2. Mean grad 75mmHg; b. 10/2020 Echo: EF 55-60%, no rwma, nl RV fxn, RVSP 43mmHg, mild BAE, mod-sev mitral annular Ca2+ w/o MS/MR. Mod-sev AS. AVA 0.91cm^2 (VTI), mean grad 18mmHg.   Neuropathy    Oxygen deficiency    3L/HS   PAF (paroxysmal atrial fibrillation) ([place])    a. Post-termination pauses. Prev on amio.  CHA2DS2VASc 6-->eliqis.   Pain    BACK/DDD   Peripheral vascular disease (HCC)    RLS (restless legs syndrome)    Sleep apnea    Wheezing    Past Surgical History:  Procedure Laterality Date   BREAST SURGERY     CATARACT EXTRACTION W/PHACO Right 03/02/2018   Procedure: CATARACT EXTRACTION PHACO AND INTRAOCULAR LENS PLACEMENT ([place]);  Surgeon: Neville Route,  Aaron Edelman, MD;  Location: [facility];  Service: Ophthalmology;  Laterality: Right;  [place] 01:15 CDE 16.46 Fluid pack lot # [id number]   CATARACT EXTRACTION W/PHACO Left 04/27/2018   Procedure: CATARACT EXTRACTION PHACO AND INTRAOCULAR LENS PLACEMENT ([place])- LEFT DIABETIC;  Surgeon: Marchia Meiers, MD;  Location: [facility];  Service: Ophthalmology;  Laterality: Left;  Lot # [id number] H [place]: 00:46.3 CDE: 8.07    CYST EXCISION     FOREHEAD   FOOT SURGERY     CYST   RIGHT/LEFT HEART CATH AND CORONARY ANGIOGRAPHY N/A  03/31/2020   Procedure: RIGHT/LEFT HEART CATH AND CORONARY ANGIOGRAPHY;  Surgeon: Wellington Hampshire, MD;  Location: [facility];  Service: Cardiovascular;  Laterality: N/A;   TUBAL LIGATION     Family History  Problem Relation Age of Onset   Heart disease Mother    Cancer Father    Social History   Tobacco Use   Smoking status: Every Day    Packs/day: 1.00    Years: 50.00    Pack years: 50.00    Types: Cigarettes   Smokeless tobacco: Never   Tobacco comments:    4-5 cigs per day 01/23/2021  Substance Use Topics   Alcohol use: Not Currently   Allergies  Allergen Reactions   Altace [Ramipril] Swelling     Review of Systems  Constitutional:  Positive for fatigue (with minimal exertion). Negative for appetite change.  HENT:  Negative for congestion, rhinorrhea and sore throat.   Eyes: Negative.   Respiratory:  Positive for shortness of breath (with minimal exertion). Negative for chest tightness.   Cardiovascular:  Negative for chest pain, palpitations and leg swelling.  Gastrointestinal:  Negative for abdominal distention and abdominal pain.  Endocrine: Negative.   Genitourinary: Negative.   Musculoskeletal:  Positive for back pain. Negative for neck pain.  Skin: Negative.   Allergic/Immunologic: Negative.   Neurological:  Positive for light-headedness. Negative for dizziness.  Hematological:  Negative for adenopathy. Does not bruise/bleed easily.  Psychiatric/Behavioral:  Positive for sleep disturbance (trouble falling asleep). Negative for dysphoric mood. The patient is not nervous/anxious.      Physical Exam Vitals and nursing note reviewed.  Constitutional:      Appearance: Normal appearance.  HENT:     Head: Normocephalic and atraumatic.  Cardiovascular:     Rate and Rhythm: Normal rate and regular rhythm.  Pulmonary:     Effort: Pulmonary effort is normal.     Breath sounds: Rhonchi present. No wheezing or rales.  Abdominal:     General: There is  no distension.     Palpations: Abdomen is soft.  Musculoskeletal:        General: No tenderness.     Cervical back: Normal range of motion and neck supple.     Right lower leg: No edema.     Left lower leg: No edema.  Skin:    General: Skin is warm and dry.  Neurological:     General: No focal deficit present.     Mental Status: She is alert and oriented to person, place, and time.  Psychiatric:        Mood and Affect: Mood normal.        Behavior: Behavior normal.    Assessment & Plan:  1: Chronic heart failure with preserved ejection fraction with structural changes (LVH)- - NYHA class III - euvolemic today - weighing daily and she was reminded to call for an overnight weight gain of >2 pounds or a weekly weight  gain of >5 pounds - she is not adding salt and is using Mrs Deliah Boston for seasoning. Daughter does the cooking and she has been reading food labels. Reviewed the importance of closely following a 2000mg  sodium diet  - saw cardiology Sharolyn Douglas) 01/07/21 - BNP 01/18/21 was 986.7  2: Aortic valve stenosis - saw surgery Julianne Handler) 12/11/20  3: DM- - saw PCP at [place] on  - BMP 01/20/21 reviewed and showed sodium 138, potassium 4.4, creatinine 1.68 and GFR 33 - A1c 12/23/20 was 5.4%  4: COPD- - wearing oxygen at 2L around the clock - saw pulmonology Melvyn Novas) 01/23/21  5: Tobacco use- - smoking 1/2 ppd of cigarettes daily - cessation discussed for 3 minutes with her    Medication list was reviewed.

## 2021-01-26 ENCOUNTER — Ambulatory Visit: Payer: Medicare Other | Admitting: Family

## 2021-01-29 ENCOUNTER — Ambulatory Visit: Payer: Medicare Other | Admitting: Family

## 2021-02-03 ENCOUNTER — Ambulatory Visit
Admission: RE | Admit: 2021-02-03 | Discharge: 2021-02-03 | Disposition: A | Discharge status: Home / Self Care | Payer: Medicare Other | Source: Ambulatory Visit | Attending: Radiation Oncology | Admitting: Radiation Oncology

## 2021-02-03 ENCOUNTER — Other Ambulatory Visit: Payer: Self-pay

## 2021-02-03 DIAGNOSIS — C3411 Malignant neoplasm of upper lobe, right bronchus or lung: Secondary | ICD-10-CM | POA: Diagnosis present

## 2021-02-03 DIAGNOSIS — I700 Atherosclerosis of aorta: Secondary | ICD-10-CM | POA: Diagnosis not present

## 2021-02-03 DIAGNOSIS — Z79899 Other long term (current) drug therapy: Secondary | ICD-10-CM | POA: Diagnosis not present

## 2021-02-03 DIAGNOSIS — J439 Emphysema, unspecified: Secondary | ICD-10-CM | POA: Insufficient documentation

## 2021-02-03 LAB — GLUCOSE, CAPILLARY: Glucose-Capillary: 98 mg/dL (ref 70–99)

## 2021-02-03 MED ORDER — FLUDEOXYGLUCOSE F - 18 (FDG) INJECTION
11.9900 | Freq: Once | INTRAVENOUS | Status: AC | PRN
  Administered 2021-02-03: 11.99 via INTRAVENOUS

## 2021-02-04 NOTE — Progress Notes (Signed)
Radiation Oncology         [phone] ________________________________  Name: Kristin [name] MRN: [id number]  Date: 02/05/2021  DOB: 04-Mar-1951  Follow-Up Visit Note  CC: [facility]  Rexene Alberts, MD    ICD-10-CM   1. 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 To review, chest CT performed on 01/14/21 demonstrated a slight interval decrease in the size of the right upper lobe pulmonary nodule, with surrounding post treatment changes. However, a new mediastinal nodal enlargement was seen and is suspicious for nodal evolvement. Also seen was a small 2 mm right upper lobe pulmonary nodule along the fissure; not definitively seen previously. A 3.7 cm left upper pole intermediate density renal lesion was noted as incompletely visualized as well; noted as mostly likely a hemorrhagic or proteinaceous cyst based on comparison with prior imaging.                PET performed on 02/03/21 demonstrated signs of metastatic disease involving mediastinal and right hilar lymph nodes. Subcarinal lymph nodes appreciated appeared without increased metabolic activity at time of imaging. Post treatment changes in the right chest area were also visualized, as well as a renal lesion without FDG accumulation; noted as compatible with benign or indolent process and non-specific in the setting of renal mass.        Patient reports being in the  hospital on 3 separate occasions for breathing issues.  She continues to be on supplemental oxygen at 3 L continuously.  She presents today in a wheelchair as usual.  She denies any pain within the chest area significant cough or hemoptysis.                 Allergies:  is allergic to altace [ramipril].  Meds: Current Outpatient Medications  Medication Sig Dispense Refill   albuterol (PROVENTIL) (2.5 MG/3ML) 0.083% nebulizer solution Take 3 mLs (2.5 mg total) by nebulization every 4 (four) hours as needed for wheezing or shortness of breath. 75 mL 12   albuterol (VENTOLIN HFA) 108 (90 Base) MCG/ACT inhaler Inhale 2 puffs into the lungs every 4 (four) hours as needed for wheezing or shortness of breath.      apixaban (ELIQUIS) 5 MG TABS tablet Take 1 tablet (5 mg total) by mouth 2 (two) times daily. 60 tablet 11   atorvastatin (LIPITOR) 80 MG tablet Take 1 tablet (80 mg total) by mouth at bedtime. 90 tablet 3   citalopram (CELEXA) 40 MG tablet Take 40 mg by mouth daily.     docusate sodium (COLACE) 100 MG capsule Take 100 mg by mouth at bedtime as needed for mild constipation or moderate constipation.     famotidine (PEPCID) 40 MG tablet Take 40 mg by mouth at bedtime.     ferrous sulfate 325 (65 FE) MG tablet Take 325 mg by mouth daily.     gabapentin (NEURONTIN) 600 MG  tablet Take 600 mg by mouth 2 (two) times daily.      hydrOXYzine (VISTARIL) 50 MG capsule Take 50 mg by mouth every 6 (six) hours as needed for anxiety.     irbesartan (AVAPRO) 75 MG tablet Take 1 tablet (75 mg total) by mouth daily. 90 tablet 3   levothyroxine (SYNTHROID) 75 MCG tablet Take 75 mcg by mouth daily before breakfast.     lidocaine (LIDODERM) 5 % Place 3 patches onto the skin daily. (Remove after 12 hours and keep off for 12 hours)     Melatonin 10 MG TABS Take 10 mg by mouth at bedtime as needed (sleep).     nystatin (MYCOSTATIN/NYSTOP) powder Apply topically 3 (three) times daily. 15 g 0    oxyCODONE-acetaminophen (PERCOCET) 10-325 MG tablet Take 1 tablet by mouth 4 (four) times daily as needed for pain.     pantoprazole (PROTONIX) 40 MG tablet Take 40 mg by mouth 2 (two) times daily.     Tiotropium Bromide-Olodaterol (STIOLTO RESPIMAT) 2.5-2.5 MCG/ACT AERS Inhale 2 puffs into the lungs daily. 1 each 11   Tiotropium Bromide-Olodaterol (STIOLTO RESPIMAT) 2.5-2.5 MCG/ACT AERS Inhale 2 puffs into the lungs daily. 4 g 0   torsemide (DEMADEX) 20 MG tablet Take 1 tablet (20 mg total) by mouth daily. Alternating with 20 mg by mouth every other day (Patient taking differently: Take 20 mg by mouth daily.) 90 tablet 1   Vitamin D, Ergocalciferol, (DRISDOL) 1.25 MG (50000 UNIT) CAPS capsule Take 50,000 Units by mouth once a week. Mondays     metFORMIN (GLUCOPHAGE) 500 MG tablet Take 500 mg by mouth 2 (two) times daily. (Patient not taking: Reported on 02/05/2021)     nicotine (NICODERM CQ - DOSED IN MG/24 HOURS) 21 mg/24hr patch Place 1 patch (21 mg total) onto the skin daily. (Patient not taking: Reported on 02/05/2021) 28 patch 5   No current facility-administered medications for this encounter.    Physical Findings: The patient is in no acute distress. Patient is alert and oriented.  height is 5\' 2"  (1.575 m) and weight is 233 lb 9.6 oz (106 kg). Her temperature is 98.5 F (36.9 C). Her blood pressure is 115/43 (abnormal) and her pulse is 73. Her respiration is 24 (abnormal) and oxygen saturation is 99%. .   Lungs are clear to auscultation bilaterally. Heart has regular rate and rhythm. No palpable cervical, supraclavicular, or axillary adenopathy. Abdomen soft, non-tender, normal bowel sounds.    Lab Findings: Lab Results  Component Value Date   WBC 8.9 01/20/2021   HGB 9.9 (L) 01/20/2021   HCT 30.8 (L) 01/20/2021   MCV 92.2 01/20/2021   PLT 284 01/20/2021    Radiographic Findings: CT CHEST WO CONTRAST  Result Date: 01/15/2021 CLINICAL DATA:  70 year old female with non-small cell  lung cancer, post radiotherapy for a RIGHT lower lobe nodule. EXAM: CT CHEST WITHOUT CONTRAST TECHNIQUE: Multidetector CT imaging of the chest was performed following the standard protocol without IV contrast. COMPARISON:  Comparison made with November twenty-third 2021 cardiac CT. FINDINGS: Cardiovascular: Calcified aortic atherosclerosis. Three-vessel coronary artery disease. Mitral annular calcification. Heart size stable without substantial pericardial fluid. Central pulmonary vasculature normal caliber. Limited assessment of cardiovascular structures given lack of intravenous contrast. Mediastinum/Nodes: Bulky adenopathy has developed since the previous study along the RIGHT paratracheal chain (image 46/2) 2.1 cm RIGHT paratracheal lymph node scattered small lymph nodes were present in this location previously, potentially as large as 14 mm. This is contiguous with lymph nodes  tracking towards the RIGHT hilum. (Image 58/2) 15 mm subcarinal lymph node previously approximately a cm. Esophagus grossly normal. No axillary lymphadenopathy. No gross hilar adenopathy. Lungs/Pleura: 15 x 11 mm pulmonary nodule in the superior segment of the RIGHT lower lobe with irregular margins previously 18 x 14 mm with surrounding post treatment changes. Small RIGHT upper lobe pulmonary nodule along the fissure measuring 2 mm (image 43/5) Signs of pulmonary emphysema as before. Airways are patent. No effusion. Upper Abdomen: Cholelithiasis. Liver is incompletely imaged. Imaged portions of pancreas, spleen, adrenal glands and gastrointestinal tract unremarkable. 3.7 cm LEFT upper pole intermediate density well-circumscribed renal lesion is incompletely evaluated. More likely a hemorrhagic or proteinaceous cyst based on comparison with prior imaging. Musculoskeletal: Spinal degenerative changes. No acute or destructive bone process. IMPRESSION: Slight interval decrease in size of RIGHT upper lobe pulmonary nodule with surrounding  post treatment changes. However, new mediastinal nodal enlargement suspicious for nodal involvement. PET scan may be helpful for further evaluation as warranted. Small 2 mm RIGHT upper lobe pulmonary nodule along the fissure. Not definitely seen previously, suggest attention on follow-up. 3.7 cm LEFT upper pole intermediate density well-circumscribed renal lesion is incompletely evaluated. More likely a hemorrhagic or proteinaceous cyst based on comparison with prior imaging. Consider dedicated renal MRI or renal protocol CT as warranted. This has been present since 2017 and is enlarged since that time. Attention on upcoming imaging if PET is acquired may also help to guide further management. Three-vessel coronary artery disease. Cholelithiasis. Emphysema and aortic atherosclerosis. Electronically Signed   By: Zetta Bills M.D.   On: 01/15/2021 16:02   NM PET Image Restag (PS) Skull Base To Thigh  Result Date: 02/04/2021 CLINICAL DATA:  Subsequent treatment strategy for non-small cell lung cancer in a 70 year old female, assess treatment response. EXAM: NUCLEAR MEDICINE PET SKULL BASE TO THIGH TECHNIQUE: 11.99 mCi F-18 FDG was injected intravenously. Full-ring PET imaging was performed from the skull base to thigh after the radiotracer. CT data was obtained and used for attenuation correction and anatomic localization. Fasting blood glucose: 98 mg/dl COMPARISON:  PET exam from December of 2021 and most recent CT of the chest from January 14, 2021. FINDINGS: Mediastinal blood pool activity: SUV max 3.37 Liver activity: SUV max NA NECK: No hypermetabolic lymph nodes in the neck. Incidental CT findings: none CHEST: Adenopathy seen on recent chest imaging in the mediastinum and RIGHT hilum shows marked increased metabolic activity. (Image 78/3) 2.5 cm RIGHT paratracheal lymph node may have enlarged slightly since the previous imaging study showing a maximum SUV of 11.57 RIGHT hilar nodal tissue (image 82/3)  maximum SUV of 9.39 measuring approximately 2.0 cm is roughly stable compared to the previous CT. Difficult to separate from surrounding hilar structures however. RIGHT paratracheal lymph node (image 70/3) 12 mm with a maximum SUV of approximately 6.4 no effusion. Post treatment changes about the lesion in the RIGHT lung base without increased metabolic activity in the area of the previous primary tumor Incidental CT findings: Calcified atheromatous plaque of the thoracic aorta. Mitral annular calcifications and three-vessel coronary artery disease with cardiac enlargement as before. Central pulmonary vasculature mildly prominent. Limited assessment of cardiovascular structures given lack of intravenous contrast. Signs of pulmonary emphysema. No lobar consolidation or effusion. Tiny nodule seen on the recent chest CT in the RIGHT upper lobe not seen on current PET due to respiratory motion. Background pulmonary emphysema. ABDOMEN/PELVIS: No abnormal hypermetabolic activity within the liver, pancreas, adrenal glands, or spleen. No hypermetabolic lymph  nodes in the abdomen or pelvis. Incidental CT findings: No acute findings relative to the liver. Cholelithiasis without adjacent stranding. Pancreas, spleen, adrenal glands and kidneys without acute findings. Dense homogeneous 3.7 x 2.9 cm lesion arising from the upper pole the LEFT kidney shows no increased metabolic activity and is stable compared to the prior PET exam from December of 2021. Fifty-three Hounsfield unit density remains nonspecific however. No acute gastrointestinal process. Appendix is normal. Aortic atherosclerosis in the abdominal aorta without aneurysm. No adnexal mass. SKELETON: No focal hypermetabolic activity to suggest skeletal metastasis. Incidental CT findings: Spinal degenerative changes. Incompletely united impacted and angulated RIGHT proximal humeral fracture. Initially seen on the exam of April of 2022. IMPRESSION: Signs of metastatic  disease involving mediastinal and RIGHT hilar lymph nodes. Subcarinal lymph nodes without increased metabolic activity currently. Post treatment changes in the RIGHT chest as described. Renal lesion without FDG accumulation compatible with benign or indolent process. Nonspecific in the setting of renal mass. More likely hemorrhagic cyst with enlargement though would suggest follow-up renal protocol CT in 6 months to exclude the possibility of indolent renal neoplasm. Incompletely united impacted RIGHT humeral fracture. Correlate with symptoms. Minimal bony bridging seen on limited images, correlate with symptoms and with follow-up as warranted. Aortic Atherosclerosis (ICD10-I70.0) and Emphysema (ICD10-J43.9). Electronically Signed   By: Zetta Bills M.D.   On: 02/04/2021 09:16   DG Chest Port 1 View  Result Date: 01/18/2021 CLINICAL DATA:  Chronic diastolic heart failure, respiratory failure, COPD, asthma EXAM: PORTABLE CHEST 1 VIEW COMPARISON:  Chest radiograph from one day prior. FINDINGS: Stable cardiomediastinal silhouette with mild cardiomegaly. No pneumothorax. No pleural effusion. Borderline mild pulmonary edema, slightly improved. No acute consolidative airspace disease. IMPRESSION: Borderline mild congestive heart failure, slightly improved. Electronically Signed   By: Ilona Sorrel M.D.   On: 01/18/2021 07:11   DG Chest Portable 1 View  Result Date: 01/17/2021 CLINICAL DATA:  Shortness of breath. EXAM: PORTABLE CHEST 1 VIEW COMPARISON:  Chest radiograph 12/30/2020. FINDINGS: Monitoring leads overlie the patient. Stable cardiomegaly. Aortic atherosclerosis. Diffuse bilateral interstitial pulmonary opacities. No pleural effusion or pneumothorax. Thoracic spine degenerative changes. IMPRESSION: Findings most compatible with cardiomegaly and interstitial edema. Some of the background of interstitial changes are likely chronic in etiology. Electronically Signed   By: Lovey Newcomer M.D.   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  Unfortunately on recent PET scan she has progression of disease in the right hilar and mediastinal area.  In light of the patient's severe respiratory issues I do not feel she would be able to tolerate 6 weeks of radiation therapy to the area of recurrence.  If she completed treatment she would likely be a pulmonary cripple with significant worsening of her breathing situation.  I discussed these issues in detail with patient and her daughter and we reviewed the PET scan images in detail.  I recommended she consult with medical oncology, Dr. Julien Nordmann to see if she would be a candidate for other therapies such as chemotherapy or immunotherapy.  Plan: Referral placed for medical oncology consultation.  In addition the patient reports some memory issues and some headaches.  In light of this she will undergo MRI of the brain for further evaluation of this issue.   25 minutes  of total time was spent for this patient encounter, including preparation, face-to-face counseling with the patient and coordination of care, physical exam, and documentation of the encounter and ordering of consultation appointments and brain MRI. ____________________________________  Blair Promise, PhD, MD   This document serves as a record of services personally performed by Gery Pray, MD. It was created on his behalf by Roney Mans, a trained medical scribe. The creation of this record is based on the scribe's personal observations and the provider's statements to them. This document has been checked and approved by the attending provider.

## 2021-02-05 ENCOUNTER — Encounter: Payer: Self-pay | Admitting: Radiation Oncology

## 2021-02-05 ENCOUNTER — Other Ambulatory Visit: Payer: Self-pay

## 2021-02-05 ENCOUNTER — Ambulatory Visit
Admission: RE | Admit: 2021-02-05 | Discharge: 2021-02-05 | Disposition: A | Discharge status: Home / Self Care | Payer: Medicare Other | Source: Ambulatory Visit | Attending: Radiation Oncology | Admitting: Radiation Oncology

## 2021-02-05 DIAGNOSIS — Z7984 Long term (current) use of oral hypoglycemic drugs: Secondary | ICD-10-CM | POA: Diagnosis not present

## 2021-02-05 DIAGNOSIS — I2510 Atherosclerotic heart disease of native coronary artery without angina pectoris: Secondary | ICD-10-CM | POA: Insufficient documentation

## 2021-02-05 DIAGNOSIS — K8020 Calculus of gallbladder without cholecystitis without obstruction: Secondary | ICD-10-CM | POA: Insufficient documentation

## 2021-02-05 DIAGNOSIS — Z79899 Other long term (current) drug therapy: Secondary | ICD-10-CM | POA: Insufficient documentation

## 2021-02-05 DIAGNOSIS — Z7901 Long term (current) use of anticoagulants: Secondary | ICD-10-CM | POA: Diagnosis not present

## 2021-02-05 DIAGNOSIS — I700 Atherosclerosis of aorta: Secondary | ICD-10-CM | POA: Diagnosis not present

## 2021-02-05 DIAGNOSIS — C3431 Malignant neoplasm of lower lobe, right bronchus or lung: Secondary | ICD-10-CM | POA: Diagnosis present

## 2021-02-05 DIAGNOSIS — N289 Disorder of kidney and ureter, unspecified: Secondary | ICD-10-CM | POA: Insufficient documentation

## 2021-02-05 DIAGNOSIS — Z923 Personal history of irradiation: Secondary | ICD-10-CM | POA: Diagnosis not present

## 2021-02-05 DIAGNOSIS — R590 Localized enlarged lymph nodes: Secondary | ICD-10-CM | POA: Insufficient documentation

## 2021-02-05 DIAGNOSIS — J439 Emphysema, unspecified: Secondary | ICD-10-CM | POA: Insufficient documentation

## 2021-02-05 DIAGNOSIS — C3411 Malignant neoplasm of upper lobe, right bronchus or lung: Secondary | ICD-10-CM

## 2021-02-05 DIAGNOSIS — I5032 Chronic diastolic (congestive) heart failure: Secondary | ICD-10-CM | POA: Diagnosis not present

## 2021-02-05 DIAGNOSIS — M47814 Spondylosis without myelopathy or radiculopathy, thoracic region: Secondary | ICD-10-CM | POA: Insufficient documentation

## 2021-02-05 NOTE — Progress Notes (Signed)
Kristin [name] is here today for follow up post radiation to the lung.  Lung Side: Right, completed radiation treatment on 08/08/20.  Does the patient complain of any of the following: Pain:Patient denies pain.  Shortness of breath w/wo exertion: Reports shortness of breath on exertion, currently on 3 Liters of 02.  Cough: reports having a productive cough. Reports sputum to be thick and white.  Hemoptysis: no Pain with swallowing: no Swallowing/choking concerns: Reports having indigestion.  Appetite: Good  Energy Level: Reports increase in energy level.  Post radiation skin Changes: Skin remains intact.     Additional comments if applicable:  Vitals:   85/88/50 0915  BP: (!) 115/43  Pulse: 73  Resp: (!) 24  Temp: 98.5 F (36.9 C)  SpO2: 99%  Weight: 233 lb 9.6 oz (106 kg)  Height: 5\' 2"  (1.575 m)  PF: (!) 3 L/min

## 2021-02-06 ENCOUNTER — Ambulatory Visit (INDEPENDENT_AMBULATORY_CARE_PROVIDER_SITE_OTHER): Payer: Medicare Other | Admitting: Nurse Practitioner

## 2021-02-06 ENCOUNTER — Encounter: Payer: Self-pay | Admitting: Nurse Practitioner

## 2021-02-06 VITALS — BP 122/58 | HR 71 | Ht 62.0 in | Wt 233.4 lb

## 2021-02-06 DIAGNOSIS — I495 Sick sinus syndrome: Secondary | ICD-10-CM | POA: Diagnosis not present

## 2021-02-06 DIAGNOSIS — I480 Paroxysmal atrial fibrillation: Secondary | ICD-10-CM | POA: Diagnosis not present

## 2021-02-06 DIAGNOSIS — Z720 Tobacco use: Secondary | ICD-10-CM

## 2021-02-06 DIAGNOSIS — J449 Chronic obstructive pulmonary disease, unspecified: Secondary | ICD-10-CM

## 2021-02-06 DIAGNOSIS — I350 Nonrheumatic aortic (valve) stenosis: Secondary | ICD-10-CM

## 2021-02-06 DIAGNOSIS — I2510 Atherosclerotic heart disease of native coronary artery without angina pectoris: Secondary | ICD-10-CM | POA: Diagnosis not present

## 2021-02-06 DIAGNOSIS — I10 Essential (primary) hypertension: Secondary | ICD-10-CM

## 2021-02-06 DIAGNOSIS — E782 Mixed hyperlipidemia: Secondary | ICD-10-CM

## 2021-02-06 DIAGNOSIS — I5032 Chronic diastolic (congestive) heart failure: Secondary | ICD-10-CM

## 2021-02-06 NOTE — Patient Instructions (Signed)
Medication Instructions:  No changes today  *If you need a refill on your cardiac medications before your next appointment, please call your pharmacy*   Lab Work: No changes at this time.   If you have labs (blood work) drawn today and your tests are completely normal, you will receive your results only by: [place] (if you have MyChart) OR A paper copy in the mail If you have any lab test that is abnormal or we need to change your treatment, we will call you to review the results.   Testing/Procedures: No changes at this time  Referral placed for electrical physiologist (EP) with Dr. Quentin Ore here in our office.   Follow-Up: At [facility], you and your health needs are our priority.  As part of our continuing mission to provide you with exceptional heart care, we have created designated Provider Care Teams.  These Care Teams include your primary Cardiologist (physician) and Advanced Practice Providers (APPs -  Physician Assistants and Nurse Practitioners) who all work together to provide you with the care you need, when you need it.   Your next appointment:   Referral placed above   The format for your next appointment:   In Person  Provider:   Dr. Quentin Ore

## 2021-02-06 NOTE — Progress Notes (Signed)
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  Past Medical History    Past Medical History:  Diagnosis Date   (HFpEF) heart failure with preserved ejection fraction ([place])    a. 09/2020 Echo: EF >55%; b. 10/2020 Echo: EF 55-60%. RVSP 76mmHg.   Anxiety    Asthma    CAD (coronary artery disease)    a. 03/2020 Cath: LM nl, LAD 20, LCX 60p, 39m/d, RCA 30p, 23m, RPDA 30-->Med Rx.   COPD (chronic obstructive pulmonary disease) (HCC)    Diabetes mellitus without complication (HCC)    Dyspnea    GERD (gastroesophageal reflux disease)    History of hiatal hernia    History of orthopnea    History of radiation therapy 07/29/20-08/08/20   Right Lung, SBRT Dr. Gery [name]   Hypothyroidism    Moderate aortic stenosis    a. 03/2020 Echo: Mod-sev AS. AVA 1.11cm^2. Mean grad 60mmHg; b. 10/2020 Echo: EF 55-60%, no rwma, nl RV fxn, RVSP 27mmHg, mild BAE, mod-sev mitral annular Ca2+ w/o MS/MR. Mod-sev AS. AVA 0.91cm^2 (VTI), mean grad 59mmHg.   Neuropathy    Oxygen deficiency    3L/HS   PAF (paroxysmal atrial fibrillation) ([place])    a. Post-termination pauses. Prev on amio.  CHA2DS2VASc 6-->eliqis; b. 01/2021 Zio: Predominantly RSR w/ avg HR 81 (41-194). 2 runs NSVT vs AF w/ aberrancy (longest 12 beats @ 185). 531 SVT runs (longest 11 beats; fastest 194). 10% Afib burden (80-185 bpm, avg 118). Longest Afib 45m49s. 19 pauses - longest 4.1secs - jxnl  rhythm present. Isolated PACs (1.6%).   Pain    BACK/DDD   Peripheral vascular disease (HCC)    RLS (restless legs syndrome)    Sleep apnea    Wheezing    Past Surgical History:  Procedure Laterality Date   BREAST SURGERY     CATARACT EXTRACTION W/PHACO Right 03/02/2018   Procedure: CATARACT EXTRACTION PHACO AND INTRAOCULAR LENS PLACEMENT ([place]);  Surgeon: Kristin Meiers, MD;  Location: [facility] ORS;  Service: Ophthalmology;  Laterality: Right;  [place] 01:15 CDE 16.46 Fluid pack lot # [id number]   CATARACT EXTRACTION W/PHACO Left 04/27/2018   Procedure: CATARACT EXTRACTION PHACO AND INTRAOCULAR LENS PLACEMENT ([place])- LEFT DIABETIC;  Surgeon: Kristin Meiers, MD;  Location: [facility] ORS;  Service: Ophthalmology;  Laterality: Left;  Lot # [id number] [place]: 00:46.3 CDE: 8.07    CYST EXCISION     FOREHEAD   FOOT SURGERY     CYST   RIGHT/LEFT HEART CATH AND CORONARY ANGIOGRAPHY N/A 03/31/2020   Procedure: RIGHT/LEFT HEART CATH AND CORONARY ANGIOGRAPHY;  Surgeon: Kristin Hampshire, MD;  Location: [place] [facility];  Service: Cardiovascular;  Laterality: N/A;   TUBAL LIGATION      Allergies  Allergies  Allergen Reactions   Altace [Ramipril] Swelling    History of Present Illness    70 year old female with the above complex past medical history including HFpEF, monitor severe aortic stenosis, paroxysmal atrial fibrillation with posttermination pauses, hypertension, chronic respiratory failure on home O2, tobacco  abuse, COPD, sleep apnea, hypothyroidism, type 2 diabetes mellitus, anxiety, GERD, and 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  She was admitted to [facility] in November 2021 with chest pain and mild troponin elevation.  Echocardiogram showed normal LV function but moderate to severe aortic stenosis with a mean gradient of 31 mmHg.  Diagnostic catheterization revealed moderate, diffuse, nonobstructive CAD, and medical therapy was recommended.  She was referred to structural  heart clinic and underwent further work-up for TAVR, which showed anatomy suitable for a 26 mm Edwards SAPIEN 3 valve.  CT of the abdomen incidentally showed a right lower lobe mass with biopsy showing squamous cell carcinoma.  TAVR work-up was delayed.  She underwent radiation therapy.  In January of this year, she was admitted with atrial flutter and rapid ventricular response.  Subsequent monitoring showed short runs of atrial fibrillation.  She was placed on Eliquis and amiodarone.  Follow-up echo in June 2022 showed an EF of 55-60%, moderate LVH, and moderate to severe arctic stenosis with a mean gradient of 29 mmHg, and peak gradient of 48 mmHg.  Aortic valve area was measured at 0.85 cm (Vmax).  Kristin [name] was seen in structural heart clinic in July himself to be overall stable.  She is not felt to be a surgical candidate for aortic stenosis given comorbidities but is felt to be a TAVR candidate pending progression of disease.  Plan at that time was for follow-up echo in 6 months.  Unfortunately, she was admitted to [facility] in August 2022 with hypotension and blood pressures in the 70s.  In the emergency department, EKG showed sinus bradycardia at 42 bpm.  Beta-blocker and amiodarone were held and both heart rates and blood pressures improved.  Diuretic therapy was also held in the setting of acute kidney injury with a creatinine up to 2.49.  She was noted to have short runs of atrial fibrillation with heart rates in the 80s during admission.  No further significant bradycardia was noted and she was discharged home off of amiodarone and beta-blocker.  Kristin [name] was seen in cardiology clinic on August 24, at which time she was hemodynamically stable and euvolemic on examination.  A Zio monitor was placed, as this was planned following discharge.  Follow-up CT of the chest on August 31 showed new mediastinal lymphadenopathy and a new small right upper lobe nodule.  Unfortunately, she was readmitted  September 3 to September 7 secondary to severe dyspnea and hypoxia with saturations in the 60s on room air.  In that setting, she had altered mental status.  In the ED, she was transitioned to BiPAP with rapid improvement.  She was treated with steroids for COPD exacerbation.  Hospital notes indicate that heart rates were stable during admission.  Post hospitalization, she returned her Zio monitor, which showed a 10% A. fib burden.  She was also noted to have 2 runs of nonsustained VT versus A. fib with aberrancy, 531 brief runs of SVT, frequent PACs, and 19 pauses, the longest of which was 4.1 seconds with junctional rhythm present.  Despite multiple findings on monitoring, patient notes that she was asymptomatic throughout the monitoring period.  Overall, she feels as though breathing is stable.  She is very sedentary and more or less bed/chair bound.  She wears oxygen 24 hours a day but will take it off to smoke a cigarette several times a day.  She denies chest pain, palpitations, PND, orthopnea, dizziness, syncope, edema, or early satiety.  Home Medications    Current Outpatient Medications  Medication Sig Dispense Refill   albuterol (PROVENTIL) (2.5 MG/3ML) 0.083% nebulizer solution Take 3 mLs (2.5 mg total) by nebulization every 4 (four) hours as needed for wheezing or shortness of breath. 75 mL 12   albuterol (VENTOLIN HFA) 108 (90 Base) MCG/ACT inhaler Inhale 2 puffs into the lungs every 4 (four) hours as needed for wheezing or shortness of breath.      apixaban (ELIQUIS) 5 MG TABS tablet Take 1 tablet (5 mg total) by mouth 2 (two) times daily. 60 tablet 11   atorvastatin (LIPITOR) 80 MG tablet Take 1 tablet (80 mg total) by mouth at bedtime. 90 tablet 3   citalopram (CELEXA) 40 MG tablet Take 40 mg by mouth daily.     docusate sodium (COLACE) 100 MG capsule Take 100 mg by mouth at bedtime as needed for mild constipation or moderate constipation.     famotidine (PEPCID) 40 MG tablet Take 40 mg  by mouth at bedtime.     ferrous sulfate 325 (65 FE) MG tablet Take 325 mg by mouth daily.     gabapentin (NEURONTIN) 600 MG tablet Take 600 mg by mouth 2 (two) times daily.      hydrOXYzine (VISTARIL) 50 MG capsule Take 50 mg by mouth every 6 (six) hours as needed for anxiety.     irbesartan (AVAPRO) 75 MG tablet Take 1 tablet (75 mg total) by mouth daily. 90 tablet 3   levothyroxine (SYNTHROID) 75 MCG tablet Take 75 mcg by mouth daily before breakfast.     lidocaine (LIDODERM) 5 % Place 3 patches onto the skin daily. (Remove after 12 hours and keep off for 12 hours)     Melatonin 10 MG TABS Take 10 mg by mouth at bedtime as needed (sleep).     metFORMIN (GLUCOPHAGE) 500 MG tablet Take 500 mg by mouth 2 (two) times daily.     nicotine (NICODERM CQ - DOSED IN MG/24 HOURS) 21 mg/24hr patch Place 1 patch (21 mg total) onto the skin daily. 28 patch 5   nystatin (MYCOSTATIN/NYSTOP) powder Apply topically 3 (three) times daily. 15 g 0   oxyCODONE-acetaminophen (PERCOCET) 10-325 MG tablet Take 1 tablet by mouth 4 (four) times daily as needed for pain.     pantoprazole (PROTONIX) 40 MG tablet Take 40 mg by mouth 2 (two) times daily.     Tiotropium Bromide-Olodaterol (STIOLTO RESPIMAT) 2.5-2.5 MCG/ACT AERS Inhale 2 puffs into the lungs daily. 1 each 11   Tiotropium Bromide-Olodaterol (STIOLTO RESPIMAT) 2.5-2.5 MCG/ACT AERS Inhale 2 puffs into the lungs daily. 4 g 0   torsemide (DEMADEX) 20 MG tablet Take 1 tablet (20 mg total) by mouth daily. Alternating with 20 mg by mouth every other day 90 tablet 1   Vitamin D, Ergocalciferol, (DRISDOL) 1.25 MG (50000 UNIT) CAPS capsule Take 50,000 Units by mouth once a week. Mondays     No current facility-administered medications for this visit.     Review of Systems    Chronic dyspnea, which is stable.  She denies chest pain, palpitations, PND, orthopnea, dizziness, syncope, edema, or early satiety.  All other systems reviewed and are otherwise negative except  as noted above.  Physical Exam    VS:  BP (!) 122/58   Pulse 71   Ht 5\' 2"  (1.575 m)   Wt 233 lb 6.4 oz (105.9 kg)   LMP  (LMP Unknown)   SpO2 97%   BMI 42.69 kg/m  ,  BMI Body mass index is 42.69 kg/m.     GEN: Well nourished, well developed, in no acute distress. HEENT: normal. Neck: Supple, no JVD, carotid bruits, or masses. Cardiac: RRR, 3/6 systolic ejection murmur loudest at the upper sternal borders, no rubs, or gallops. No clubbing, cyanosis, edema.  Radials 2+/PT 1+ and equal bilaterally.  Respiratory:  Respirations regular and unlabored, clear to auscultation bilaterally. GI: Soft, nontender, nondistended, BS + x 4. MS: no deformity or atrophy. Skin: warm and dry, no rash. Neuro:  Strength and sensation are intact. Psych: Normal affect.  Accessory Clinical Findings    ECG personally reviewed by me today -regular sinus rhythm, 71- no acute changes.  Lab Results  Component Value Date   WBC 8.9 01/20/2021   HGB 9.9 (L) 01/20/2021   HCT 30.8 (L) 01/20/2021   MCV 92.2 01/20/2021   PLT 284 01/20/2021   Lab Results  Component Value Date   CREATININE 1.68 (H) 01/20/2021   BUN 53 (H) 01/20/2021   NA 138 01/20/2021   K 4.4 01/20/2021   CL 94 (L) 01/20/2021   CO2 35 (H) 01/20/2021   Lab Results  Component Value Date   ALT 14 01/19/2021   AST 15 01/19/2021   ALKPHOS 32 (L) 01/19/2021   BILITOT 0.6 01/19/2021   Lab Results  Component Value Date   CHOL 165 06/27/2020   HDL 61 06/27/2020   LDLCALC 91 06/27/2020   TRIG 64 06/27/2020   CHOLHDL 2.7 06/27/2020    Lab Results  Component Value Date   HGBA1C 5.4 12/23/2020    Assessment & Plan    1.  Paroxysmal atrial fibrillation: Recent Zio monitor showed a 10% A. fib burden with rates between 80 and 185 bpm (average 118).  Patient also had 19 pauses up to 4.1 seconds.  She was previously on amiodarone and beta-blocker therapy but these were discontinued in the setting of presyncope, bradycardia, and  hypotension back in August.  Patient was asymptomatic throughout the [place] monitoring.  I discussed her monitor with Dr. Rockey Situ today.  We collectively agreed that given multiple arrhythmias and pauses, patient would benefit most from electrophysiology evaluation.  She remains on Eliquis therapy.  2.  Moderate to severe aortic stenosis: Stable by echocardiogram in June with plan for follow-up with structural heart clinic and repeat echo in December/January timeframe.  3.  Chronic heart failure with preserved ejection fraction: Euvolemic on examination and hemodynamically stable.  Continue current torsemide dose.  4.  Sinus pauses with history of syncope/SSS: Recent monitoring with 19 pauses, the longest of which was up to 4.1 seconds.  Not all of these occurred during periods of presumed sleep.  Patient was asymptomatic throughout the monitoring period.  She remains off of amiodarone and beta-blocker therapy.  In the setting of a 10% A. fib burden with sometimes rapid rates, will defer any decisions regarding antiarrhythmics to EP.  5.  COPD/tobacco abuse/lung cancer: Following last visit, she was admitted secondary to hypoxia and altered mental status in the setting of an acute exacerbation of COPD.  She is also had progression of lung cancer by CT and was seen by radiation oncology yesterday.  In the setting of severe lung disease, it was felt that she would not likely tolerate 6 additional weeks of radiation.  She will be referred to medical oncology for additional therapy such as chemotherapy or immunotherapy.  She remains on 24-hour oxygen supplementation though will remove this to smoke.  Discussed the importance of smoking  cessation.  6.  Hyperlipidemia: LDL of 91 on statin therapy.  7.  Type 2 diabetes mellitus: A1c 5.4 in August.  She is on metformin.  8.  Essential hypertension: Stable.  9.  CAD:  nonobs dzs.  No chest pain.  Cont statin.  10.  Disposition: Given multiple arrhythmias on Zio  monitoring with history of pauses, bradycardia, and syncope in the setting of amiodarone and metoprolol, will refer to electrophysiology.  Murray Hodgkins, NP 02/06/2021, 12:44 PM

## 2021-02-10 ENCOUNTER — Encounter: Payer: Self-pay | Admitting: Family

## 2021-02-10 ENCOUNTER — Ambulatory Visit: Payer: Medicare Other | Attending: Family | Admitting: Family

## 2021-02-10 ENCOUNTER — Other Ambulatory Visit: Payer: Self-pay

## 2021-02-10 VITALS — BP 107/38 | HR 105 | Resp 18 | Ht 62.0 in | Wt 236.0 lb

## 2021-02-10 DIAGNOSIS — E1122 Type 2 diabetes mellitus with diabetic chronic kidney disease: Secondary | ICD-10-CM

## 2021-02-10 DIAGNOSIS — I480 Paroxysmal atrial fibrillation: Secondary | ICD-10-CM

## 2021-02-10 DIAGNOSIS — J449 Chronic obstructive pulmonary disease, unspecified: Secondary | ICD-10-CM | POA: Insufficient documentation

## 2021-02-10 DIAGNOSIS — E1151 Type 2 diabetes mellitus with diabetic peripheral angiopathy without gangrene: Secondary | ICD-10-CM | POA: Diagnosis not present

## 2021-02-10 DIAGNOSIS — Z8249 Family history of ischemic heart disease and other diseases of the circulatory system: Secondary | ICD-10-CM | POA: Diagnosis not present

## 2021-02-10 DIAGNOSIS — F17210 Nicotine dependence, cigarettes, uncomplicated: Secondary | ICD-10-CM | POA: Diagnosis not present

## 2021-02-10 DIAGNOSIS — I5032 Chronic diastolic (congestive) heart failure: Secondary | ICD-10-CM | POA: Diagnosis present

## 2021-02-10 DIAGNOSIS — Z7984 Long term (current) use of oral hypoglycemic drugs: Secondary | ICD-10-CM | POA: Diagnosis not present

## 2021-02-10 DIAGNOSIS — I350 Nonrheumatic aortic (valve) stenosis: Secondary | ICD-10-CM | POA: Insufficient documentation

## 2021-02-10 DIAGNOSIS — N1832 Chronic kidney disease, stage 3b: Secondary | ICD-10-CM

## 2021-02-10 NOTE — Progress Notes (Signed)
Patient ID: Drucilla Chalet Krupinski, female    DOB: [birth year], 70 y.o.   MRN: [id number]   Ms Vine is a 70 y/o female with a history of asthma, DM, thyroid disease, COPD, obstructive sleep apnea, GERD, anxiety, PVD, current tobacco use and chronic heart failure.   Echo report from 10/24/20 reviewed and showed an EF of 55-60% along with moderate LVH, mildly elevated PA pressure and moderate aortic thickening. Echo report from 07/05/2018 reviewed and showed an EF of 60-65% along with moderate AS.   RHC/LHC done 03/31/20 and showed: Prox RCA lesion is 30% stenosed. Mid RCA lesion is 40% stenosed. RPDA lesion is 30% stenosed. Prox Cx lesion is 60% stenosed. Mid LAD lesion is 20% stenosed. Mid Cx to Dist Cx lesion is 20% stenosed.  1.  Mild to moderate nonobstructive coronary artery disease.  Worst stenosis is 60% in the proximal left circumflex.  No evidence of obstructive disease.  Coronary arteries are overall moderately calcified. 2.  Right heart catheterization showed moderately elevated left-sided filling pressures, moderate pulmonary hypertension and moderately reduced cardiac output. 3.  Severe aortic stenosis with mean gradient of 31 mmHg and calculated valve area of 0.7 cm.  Admitted 01/17/21 due to shortness of breath and confusion due to sats in the 60's. Placed on CPAP wit transition to bipap with improvement of mental status. Treated with steroids for COPD exacerbation. Discharged after 4 days. Admitted 12/30/20 due to generalized weakness and dizziness and found to be hypotensive. Cardiology and nephrology consults obtained. Medications adjusted. Discharged after 3 days. Admitted 12/22/20 due to acute on chronic heart failure. Initially placed on bipap and given IV lasix. Given IV steroids and bronchodilators. Elevated troponin thought to be due to demand ischemia. Weaned off bipap to her 3L. Discharged after 3 days.   She presents today for a follow-up visit although hasn't been seen since 2020.  She presents today with a chief complaint of moderate fatigue with little exertion. She describes this as chronic in nature having been present for several years. She has associated cough, shortness of breath, pedal edema, chronic back pain and difficulty sleeping along with this. She denies any dizziness, abdominal distention, palpitations, chest pain or weight gain.   Wearing her CPAP nightly and oxygen at 3L around the clock. Has eaten some saltier foods recently (pizza at [employer])  Past Medical History:  Diagnosis Date   (HFpEF) heart failure with preserved ejection fraction ([place])    a. 09/2020 Echo: EF >55%; b. 10/2020 Echo: EF 55-60%. RVSP 23mmHg.   Anxiety    Asthma    CAD (coronary artery disease)    a. 03/2020 Cath: LM nl, LAD 20, LCX 60p, 20m/d, RCA 30p, 37m, RPDA 30-->Med Rx.   COPD (chronic obstructive pulmonary disease) (HCC)    Diabetes mellitus without complication (HCC)    Dyspnea    GERD (gastroesophageal reflux disease)    History of hiatal hernia    History of orthopnea    History of radiation therapy 07/29/20-08/08/20   Right Lung, SBRT Dr. Gery Pray   Hypothyroidism    Moderate aortic stenosis    a. 03/2020 Echo: Mod-sev AS. AVA 1.11cm^2. Mean grad 78mmHg; b. 10/2020 Echo: EF 55-60%, no rwma, nl RV fxn, RVSP 78mmHg, mild BAE, mod-sev mitral annular Ca2+ w/o MS/MR. Mod-sev AS. AVA 0.91cm^2 (VTI), mean grad 28mmHg.   Neuropathy    Oxygen deficiency    3L/HS   PAF (paroxysmal atrial fibrillation) ([place])    a. Post-termination pauses. Prev on  amio.  CHA2DS2VASc 6-->eliqis; b. 01/2021 Zio: Predominantly RSR w/ avg HR 81 (41-194). 2 runs NSVT vs AF w/ aberrancy (longest 12 beats @ 185). 531 SVT runs (longest 11 beats; fastest 194). 10% Afib burden (80-185 bpm, avg 118). Longest Afib 50m49s. 19 pauses - longest 4.1secs - jxnl rhythm present. Isolated PACs (1.6%).   Pain    BACK/DDD   Peripheral vascular disease (HCC)    RLS (restless legs syndrome)    Sleep apnea     Wheezing    Past Surgical History:  Procedure Laterality Date   BREAST SURGERY     CATARACT EXTRACTION W/PHACO Right 03/02/2018   Procedure: CATARACT EXTRACTION PHACO AND INTRAOCULAR LENS PLACEMENT ([place]);  Surgeon: Marchia Meiers, MD;  Location: [facility];  Service: Ophthalmology;  Laterality: Right;  [place] 01:15 CDE 16.46 Fluid pack lot # [id number]   CATARACT EXTRACTION W/PHACO Left 04/27/2018   Procedure: CATARACT EXTRACTION PHACO AND INTRAOCULAR LENS PLACEMENT ([place])- LEFT DIABETIC;  Surgeon: Marchia Meiers, MD;  Location: [facility];  Service: Ophthalmology;  Laterality: Left;  Lot # [id number] [place]: 00:46.3 CDE: 8.07    CYST EXCISION     FOREHEAD   FOOT SURGERY     CYST   RIGHT/LEFT HEART CATH AND CORONARY ANGIOGRAPHY N/A 03/31/2020   Procedure: RIGHT/LEFT HEART CATH AND CORONARY ANGIOGRAPHY;  Surgeon: Wellington Hampshire, MD;  Location: [facility];  Service: Cardiovascular;  Laterality: N/A;   TUBAL LIGATION     Family History  Problem Relation Age of Onset   Heart disease Mother    Cancer Father    Social History   Tobacco Use   Smoking status: Every Day    Packs/day: 1.00    Years: 50.00    Pack years: 50.00    Types: Cigarettes   Smokeless tobacco: Never   Tobacco comments:    4-5 cigs per day 01/23/2021  Substance Use Topics   Alcohol use: Not Currently   Allergies  Allergen Reactions   Altace [Ramipril] Swelling   Prior to Admission medications   Medication Sig Start Date End Date Taking? Authorizing Provider  albuterol (PROVENTIL) (2.5 MG/3ML) 0.083% nebulizer solution Take 3 mLs (2.5 mg total) by nebulization every 4 (four) hours as needed for wheezing or shortness of breath. 01/23/21  Yes Tanda Rockers, MD  albuterol (VENTOLIN HFA) 108 (90 Base) MCG/ACT inhaler Inhale 2 puffs into the lungs every 4 (four) hours as needed for wheezing or shortness of breath.    Yes [provider]  apixaban (ELIQUIS) 5 MG TABS tablet Take 1 tablet (5 mg total) by mouth 2  (two) times daily. 11/03/20  Yes Minna Merritts, MD  atorvastatin (LIPITOR) 80 MG tablet Take 1 tablet (80 mg total) by mouth at bedtime. 11/03/20  Yes Minna Merritts, MD  citalopram (CELEXA) 40 MG tablet Take 40 mg by mouth daily. 06/14/20  Yes [provider]  docusate sodium (COLACE) 100 MG capsule Take 100 mg by mouth at bedtime as needed for mild constipation or moderate constipation.   Yes [provider]  famotidine (PEPCID) 40 MG tablet Take 40 mg by mouth at bedtime.   Yes [provider]  fenofibrate 160 MG tablet Take 160 mg by mouth daily.   Yes [provider]  ferrous sulfate 325 (65 FE) MG tablet Take 325 mg by mouth daily. 06/14/20  Yes [provider]  gabapentin (NEURONTIN) 600 MG tablet Take 600 mg by mouth 2 (two) times daily.  11/20/19  Yes [provider]  hydrOXYzine (VISTARIL) 50 MG capsule Take 50 mg by mouth every 6 (six) hours as needed for anxiety. 03/14/20  Yes [provider]  irbesartan (AVAPRO) 75 MG tablet Take 1 tablet (75 mg total) by mouth daily. 01/02/21 12/28/21 Yes Wouk, Ailene Rud, MD  levothyroxine (SYNTHROID) 75 MCG tablet Take 75 mcg by mouth daily before breakfast.   Yes [provider]  lidocaine (LIDODERM) 5 % Place 3 patches onto the skin daily. (Remove after 12 hours and keep off for 12 hours)   Yes [provider]  Melatonin 10 MG TABS Take 10 mg by mouth at bedtime as needed (sleep).   Yes [provider]  nystatin (MYCOSTATIN/NYSTOP) powder Apply topically 3 (three) times daily. 01/21/21  Yes Lorella Nimrod, MD  oxyCODONE-acetaminophen (PERCOCET) 10-325 MG tablet Take 1 tablet by mouth 4 (four) times daily as needed for pain.   Yes [provider]  pantoprazole (PROTONIX) 40 MG tablet Take 40 mg by mouth 2 (two) times daily. 11/11/20  Yes [provider]  sitaGLIPtin (JANUVIA) 100 MG tablet Take 100 mg by mouth daily.   Yes [provider]   Tiotropium Bromide-Olodaterol (STIOLTO RESPIMAT) 2.5-2.5 MCG/ACT AERS Inhale 2 puffs into the lungs daily. 01/23/21  Yes Tanda Rockers, MD  Tiotropium Bromide-Olodaterol (STIOLTO RESPIMAT) 2.5-2.5 MCG/ACT AERS Inhale 2 puffs into the lungs daily. 01/23/21  Yes Tanda Rockers, MD  torsemide (DEMADEX) 20 MG tablet Take 1 tablet (20 mg total) by mouth daily. Alternating with 20 mg by mouth every other day 01/02/21  Yes Wouk, Ailene Rud, MD  Vitamin D, Ergocalciferol, (DRISDOL) 1.25 MG (50000 UNIT) CAPS capsule Take 50,000 Units by mouth once a week. Mondays   Yes [provider]  metFORMIN (GLUCOPHAGE) 500 MG tablet Take 500 mg by mouth 2 (two) times daily. Patient not taking: Reported on 02/10/2021 09/19/19   [provider]  nicotine (NICODERM CQ - DOSED IN MG/24 HOURS) 21 mg/24hr patch Place 1 patch (21 mg total) onto the skin daily. Patient not taking: Reported on 02/10/2021 08/15/20   Marrianne Mood D, PA-C   Review of Systems  Constitutional:  Positive for fatigue (with minimal exertion). Negative for appetite change.  HENT:  Negative for congestion, rhinorrhea and sore throat.   Eyes: Negative.   Respiratory:  Positive for cough (productive) and shortness of breath. Negative for chest tightness.   Cardiovascular:  Positive for leg swelling. Negative for chest pain and palpitations.  Gastrointestinal:  Negative for abdominal distention and abdominal pain.  Endocrine: Negative.   Genitourinary: Negative.   Musculoskeletal:  Positive for back pain. Negative for neck pain.  Skin: Negative.   Allergic/Immunologic: Negative.   Neurological:  Negative for dizziness and light-headedness.  Hematological:  Negative for adenopathy. Does not bruise/bleed easily.  Psychiatric/Behavioral:  Positive for sleep disturbance (wearing CPAP at night; sleeping on 2-3 pillows). Negative for dysphoric mood. The patient is not nervous/anxious.    Vitals:   02/10/21 1106 02/10/21 1111  BP: (!)  114/31 (!) 107/38  Pulse: (!) 105   Resp: 18   SpO2: 96%   Weight: 236 lb (107 kg)   Height: 5\' 2"  (1.575 m)    Wt Readings from Last 3 Encounters:  02/10/21 236 lb (107 kg)  02/06/21 233 lb 6.4 oz (105.9 kg)  02/05/21 233 lb 9.6 oz (106 kg)   Lab Results  Component Value Date   CREATININE 1.68 (H) 01/20/2021   CREATININE 1.67 (H) 01/19/2021  CREATININE 1.38 (H) 01/17/2021   Physical Exam Vitals and nursing note reviewed. Exam conducted with a chaperone present (daughter).  Constitutional:      Appearance: Normal appearance.  HENT:     Head: Normocephalic and atraumatic.  Cardiovascular:     Rate and Rhythm: Normal rate and regular rhythm.  Pulmonary:     Effort: Pulmonary effort is normal.     Breath sounds: Rhonchi (throughout all lung fields) present. No wheezing or rales.  Abdominal:     General: There is no distension.     Palpations: Abdomen is soft.  Musculoskeletal:        General: No tenderness.     Cervical back: Normal range of motion and neck supple.     Right lower leg: Edema (trace pitting) present.     Left lower leg: Edema (trace pitting) present.  Skin:    General: Skin is warm and dry.  Neurological:     General: No focal deficit present.     Mental Status: She is alert and oriented to person, place, and time.  Psychiatric:        Mood and Affect: Mood normal.        Behavior: Behavior normal.    Assessment & Plan:  1: Chronic heart failure with preserved ejection fraction with structural changes (LVH)- - NYHA class III - euvolemic today - weighing daily and she was reminded to call for an overnight weight gain of >2 pounds or a weekly weight gain of >5 pounds - she is not adding salt and is using Mrs Deliah Boston for seasoning. Daughter does the cooking and she has been reading food labels.  - cautioned about eating out and if she can plan ahead, be careful how much sodium she eats before and after the eating out meal; did recently eat pizza from [employer]; explained that with her low BP, we will be limited in her how much diuretic we can use - saw cardiology Sharolyn Douglas) 02/06/21 - BNP 01/18/21 was 986.7 - reports receiving her flu vaccine for the season - wearing CPAP nightly  2: Aortic valve stenosis - saw surgery Julianne Handler) 12/11/20  3: DM- - sees PCP at [facility] 01/20/21 reviewed and showed sodium 138, potassium 4.4, creatinine 1.68 and GFR 33 - A1c 12/23/20 was 5.4%  4: COPD- - wearing oxygen at 3L around the clock - saw pulmonology Melvyn Novas) 01/23/21 - smoking 1/2 ppd of cigarettes daily - cessation discussed for 3 minutes with her  5: Paroxysmal atrial fibrillation- - recently wore zio monitor which showed 100% a fib burden with pauses - previously had been on amiodarone and beta-blocker therapy which had be be stopped due to bradycardia, hypotension and presyncope in August 2022 - has EP appointment Quentin Ore) on 02/25/21   Medication bottles reviewed.   Return in 3 months or sooner for any questions/problems before then.

## 2021-02-10 NOTE — Patient Instructions (Signed)
Continue weighing daily and call for an overnight weight gain of > 2 pounds or a weekly weight gain of >5 pounds. 

## 2021-02-13 ENCOUNTER — Inpatient Hospital Stay: Payer: Medicare Other

## 2021-02-13 ENCOUNTER — Inpatient Hospital Stay: Payer: Medicare Other | Admitting: Internal Medicine

## 2021-02-19 ENCOUNTER — Ambulatory Visit
Admission: RE | Admit: 2021-02-19 | Discharge: 2021-02-19 | Disposition: A | Discharge status: Home / Self Care | Payer: Medicare Other | Source: Ambulatory Visit | Attending: Radiation Oncology | Admitting: Radiation Oncology

## 2021-02-19 DIAGNOSIS — C3411 Malignant neoplasm of upper lobe, right bronchus or lung: Secondary | ICD-10-CM | POA: Diagnosis not present

## 2021-02-19 MED ORDER — GADOBUTROL 1 MMOL/ML IV SOLN
10.0000 mL | Freq: Once | INTRAVENOUS | Status: AC | PRN
  Administered 2021-02-19: 10 mL via INTRAVENOUS

## 2021-02-20 ENCOUNTER — Inpatient Hospital Stay: Payer: Medicare Other | Attending: Internal Medicine | Admitting: Internal Medicine

## 2021-02-20 ENCOUNTER — Other Ambulatory Visit: Payer: Self-pay

## 2021-02-20 ENCOUNTER — Encounter: Payer: Self-pay | Admitting: *Deleted

## 2021-02-20 ENCOUNTER — Inpatient Hospital Stay: Payer: Medicare Other

## 2021-02-20 ENCOUNTER — Encounter: Payer: Self-pay | Admitting: Internal Medicine

## 2021-02-20 DIAGNOSIS — N2889 Other specified disorders of kidney and ureter: Secondary | ICD-10-CM | POA: Insufficient documentation

## 2021-02-20 DIAGNOSIS — M549 Dorsalgia, unspecified: Secondary | ICD-10-CM | POA: Insufficient documentation

## 2021-02-20 DIAGNOSIS — I480 Paroxysmal atrial fibrillation: Secondary | ICD-10-CM | POA: Diagnosis not present

## 2021-02-20 DIAGNOSIS — K8020 Calculus of gallbladder without cholecystitis without obstruction: Secondary | ICD-10-CM | POA: Diagnosis not present

## 2021-02-20 DIAGNOSIS — N183 Chronic kidney disease, stage 3 unspecified: Secondary | ICD-10-CM | POA: Diagnosis not present

## 2021-02-20 DIAGNOSIS — Z7901 Long term (current) use of anticoagulants: Secondary | ICD-10-CM | POA: Diagnosis not present

## 2021-02-20 DIAGNOSIS — R519 Headache, unspecified: Secondary | ICD-10-CM | POA: Diagnosis not present

## 2021-02-20 DIAGNOSIS — E119 Type 2 diabetes mellitus without complications: Secondary | ICD-10-CM | POA: Diagnosis not present

## 2021-02-20 DIAGNOSIS — Z8249 Family history of ischemic heart disease and other diseases of the circulatory system: Secondary | ICD-10-CM | POA: Insufficient documentation

## 2021-02-20 DIAGNOSIS — G4730 Sleep apnea, unspecified: Secondary | ICD-10-CM | POA: Diagnosis not present

## 2021-02-20 DIAGNOSIS — G629 Polyneuropathy, unspecified: Secondary | ICD-10-CM | POA: Insufficient documentation

## 2021-02-20 DIAGNOSIS — E039 Hypothyroidism, unspecified: Secondary | ICD-10-CM | POA: Diagnosis not present

## 2021-02-20 DIAGNOSIS — I700 Atherosclerosis of aorta: Secondary | ICD-10-CM | POA: Diagnosis not present

## 2021-02-20 DIAGNOSIS — Z8739 Personal history of other diseases of the musculoskeletal system and connective tissue: Secondary | ICD-10-CM | POA: Insufficient documentation

## 2021-02-20 DIAGNOSIS — F17210 Nicotine dependence, cigarettes, uncomplicated: Secondary | ICD-10-CM | POA: Insufficient documentation

## 2021-02-20 DIAGNOSIS — R059 Cough, unspecified: Secondary | ICD-10-CM | POA: Insufficient documentation

## 2021-02-20 DIAGNOSIS — I2510 Atherosclerotic heart disease of native coronary artery without angina pectoris: Secondary | ICD-10-CM | POA: Diagnosis not present

## 2021-02-20 DIAGNOSIS — J449 Chronic obstructive pulmonary disease, unspecified: Secondary | ICD-10-CM | POA: Insufficient documentation

## 2021-02-20 DIAGNOSIS — R42 Dizziness and giddiness: Secondary | ICD-10-CM | POA: Insufficient documentation

## 2021-02-20 DIAGNOSIS — M2550 Pain in unspecified joint: Secondary | ICD-10-CM | POA: Insufficient documentation

## 2021-02-20 DIAGNOSIS — R590 Localized enlarged lymph nodes: Secondary | ICD-10-CM | POA: Insufficient documentation

## 2021-02-20 DIAGNOSIS — R110 Nausea: Secondary | ICD-10-CM | POA: Insufficient documentation

## 2021-02-20 DIAGNOSIS — C3431 Malignant neoplasm of lower lobe, right bronchus or lung: Secondary | ICD-10-CM

## 2021-02-20 DIAGNOSIS — Z923 Personal history of irradiation: Secondary | ICD-10-CM | POA: Insufficient documentation

## 2021-02-20 DIAGNOSIS — R5383 Other fatigue: Secondary | ICD-10-CM | POA: Diagnosis not present

## 2021-02-20 DIAGNOSIS — I471 Supraventricular tachycardia: Secondary | ICD-10-CM | POA: Insufficient documentation

## 2021-02-20 DIAGNOSIS — R0602 Shortness of breath: Secondary | ICD-10-CM | POA: Diagnosis not present

## 2021-02-20 DIAGNOSIS — I5032 Chronic diastolic (congestive) heart failure: Secondary | ICD-10-CM | POA: Insufficient documentation

## 2021-02-20 DIAGNOSIS — Z8719 Personal history of other diseases of the digestive system: Secondary | ICD-10-CM | POA: Insufficient documentation

## 2021-02-20 DIAGNOSIS — Z809 Family history of malignant neoplasm, unspecified: Secondary | ICD-10-CM | POA: Insufficient documentation

## 2021-02-20 DIAGNOSIS — Z79899 Other long term (current) drug therapy: Secondary | ICD-10-CM

## 2021-02-20 LAB — COMPREHENSIVE METABOLIC PANEL
ALT: 12 U/L (ref 0–44)
AST: 17 U/L (ref 15–41)
Albumin: 3.3 g/dL — ABNORMAL LOW (ref 3.5–5.0)
Alkaline Phosphatase: 34 U/L — ABNORMAL LOW (ref 38–126)
Anion gap: 6 (ref 5–15)
BUN: 32 mg/dL — ABNORMAL HIGH (ref 8–23)
CO2: 35 mmol/L — ABNORMAL HIGH (ref 22–32)
Calcium: 8.6 mg/dL — ABNORMAL LOW (ref 8.9–10.3)
Chloride: 96 mmol/L — ABNORMAL LOW (ref 98–111)
Creatinine, Ser: 1.74 mg/dL — ABNORMAL HIGH (ref 0.44–1.00)
GFR, Estimated: 32 mL/min — ABNORMAL LOW (ref >60)
Glucose, Bld: 114 mg/dL — ABNORMAL HIGH (ref 70–99)
Potassium: 4.5 mmol/L (ref 3.5–5.1)
Sodium: 137 mmol/L (ref 135–145)
Total Bilirubin: 0.4 mg/dL (ref 0.3–1.2)
Total Protein: 6.7 g/dL (ref 6.5–8.1)

## 2021-02-20 LAB — CBC WITH DIFFERENTIAL/PLATELET
Abs Immature Granulocytes: 0.05 10*3/uL (ref 0.00–0.07)
Basophils Absolute: 0.1 10*3/uL (ref 0.0–0.1)
Basophils Relative: 1 %
Eosinophils Absolute: 0.2 10*3/uL (ref 0.0–0.5)
Eosinophils Relative: 3 %
HCT: 30.6 % — ABNORMAL LOW (ref 36.0–46.0)
Hemoglobin: 9.3 g/dL — ABNORMAL LOW (ref 12.0–15.0)
Immature Granulocytes: 1 %
Lymphocytes Relative: 12 %
Lymphs Abs: 1 10*3/uL (ref 0.7–4.0)
MCH: 27.8 pg (ref 26.0–34.0)
MCHC: 30.4 g/dL (ref 30.0–36.0)
MCV: 91.3 fL (ref 80.0–100.0)
Monocytes Absolute: 0.7 10*3/uL (ref 0.1–1.0)
Monocytes Relative: 9 %
Neutro Abs: 6.2 10*3/uL (ref 1.7–7.7)
Neutrophils Relative %: 74 %
Platelets: 224 10*3/uL (ref 150–400)
RBC: 3.35 MIL/uL — ABNORMAL LOW (ref 3.87–5.11)
RDW: 14.9 % (ref 11.5–15.5)
WBC: 8.2 10*3/uL (ref 4.0–10.5)
nRBC: 0 % (ref 0.0–0.2)

## 2021-02-20 NOTE — Progress Notes (Signed)
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

## 2021-02-20 NOTE — Progress Notes (Signed)
Met with patient and her daughter during initial consult with Dr. Rogue Bussing. All questions answered during visit. Reviewed upcoming appts. Contact info given and instructed to call with any questions or needs. Foundation one ordered on tissue sample from original biopsy in Jan 2022.

## 2021-02-20 NOTE — Progress Notes (Signed)
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  COPD 3 lits/O2; active smoker- 4-5 cig/day; CAD; A.fib- on eliquis [Dr.Gollan[place]]   Primary cancer of right lower lobe of lung ([place])  02/20/2021 Initial Diagnosis   Primary cancer of right lower lobe of lung ([place])   02/20/2021 -  Chemotherapy   Patient is on Treatment Plan : LUNG NSCLC flat dose Pembrolizumab Q21D        HISTORY OF PRESENTING ILLNESS: Currently in the wheelchair.  Uses a cane or walker at home.  Accompanied by daughter.  Kristin [name] 70 y.o.  female history of smoking is here for further evaluation and recommendations for clinical concern for recurrent lung cancer.  Patient treated with SBRT for stage I lung cancer in January 2022.  The patient most recently was noted to have recurrent disease-right hilar/right mediastinal adenopathy present on PET scan.  She was evaluated by radiation oncology at [facility] and found to be poor candidate for daily radiation for 6 weeks.  Referred to medical oncology for further opinion.  Patient continues to have chronic shortness of breath chronic cough.  No hemoptysis.  No nausea no vomiting.  Given the concerns for headache patient had MRI this morning pending results.   Review of Systems  Constitutional:  Positive for malaise/fatigue. Negative for chills, diaphoresis, fever and weight loss.  HENT:  Negative for nosebleeds and sore throat.   Eyes:  Negative  for double vision.  Respiratory:  Positive for cough, sputum production and shortness of breath. Negative for hemoptysis and wheezing.   Cardiovascular:  Negative for chest pain, palpitations, orthopnea and leg swelling.  Gastrointestinal:  Positive for nausea. Negative for abdominal pain, blood in stool, constipation, diarrhea, heartburn, melena and vomiting.  Genitourinary:  Negative for dysuria, frequency and urgency.  Musculoskeletal:  Positive for back pain and joint pain.  Skin: Negative.  Negative for itching and rash.  Neurological:  Positive for dizziness, tingling and headaches. Negative for focal weakness and weakness.  Endo/Heme/Allergies:  Does not bruise/bleed easily.  Psychiatric/Behavioral:  Negative for depression. The patient is not nervous/anxious and does not have insomnia.     MEDICAL HISTORY:  Past Medical History:  Diagnosis Date   (HFpEF) heart failure with preserved ejection fraction ([place])    a. 09/2020 Echo: EF >55%; b. 10/2020 Echo: EF 55-60%. RVSP 79mmHg.   Anxiety    Asthma    CAD (coronary artery disease)    a. 03/2020 Cath: LM nl, LAD 20, LCX 60p, 67m/d, RCA 30p, 27m, RPDA 30-->Med Rx.   COPD (chronic obstructive pulmonary disease) (HCC)    Diabetes mellitus without complication (HCC)    Dyspnea    GERD (gastroesophageal reflux disease)    History of hiatal hernia    History of orthopnea    History of radiation therapy 07/29/20-08/08/20   Right Lung, SBRT Dr. Gery Pray   Hypothyroidism    Moderate aortic stenosis    a. 03/2020 Echo: Mod-sev AS. AVA 1.11cm^2. Mean grad 65mmHg; b. 10/2020 Echo: EF 55-60%, no rwma, nl RV fxn, RVSP  63mmHg, mild BAE, mod-sev mitral annular Ca2+ w/o MS/MR. Mod-sev AS. AVA 0.91cm^2 (VTI), mean grad 1mmHg.   Neuropathy    Oxygen deficiency    3L/HS   PAF (paroxysmal atrial fibrillation) ([place])    a. Post-termination pauses. Prev on amio.  CHA2DS2VASc 6-->eliqis; b. 01/2021 Zio: Predominantly RSR w/ avg HR 81 (41-194). 2 runs  NSVT vs AF w/ aberrancy (longest 12 beats @ 185). 531 SVT runs (longest 11 beats; fastest 194). 10% Afib burden (80-185 bpm, avg 118). Longest Afib 54m49s. 19 pauses - longest 4.1secs - jxnl rhythm present. Isolated PACs (1.6%).   Pain    BACK/DDD   Peripheral vascular disease (HCC)    RLS (restless legs syndrome)    Sleep apnea    Wheezing     SURGICAL HISTORY: Past Surgical History:  Procedure Laterality Date   BREAST SURGERY     CATARACT EXTRACTION W/PHACO Right 03/02/2018   Procedure: CATARACT EXTRACTION PHACO AND INTRAOCULAR LENS PLACEMENT ([place]);  Surgeon: Marchia Meiers, MD;  Location: [facility] ORS;  Service: Ophthalmology;  Laterality: Right;  [place] 01:15 CDE 16.46 Fluid pack lot # [id number]   CATARACT EXTRACTION W/PHACO Left 04/27/2018   Procedure: CATARACT EXTRACTION PHACO AND INTRAOCULAR LENS PLACEMENT ([place])- LEFT DIABETIC;  Surgeon: Marchia Meiers, MD;  Location: [facility] ORS;  Service: Ophthalmology;  Laterality: Left;  Lot # [id number] H [place]: 00:46.3 CDE: 8.07    CYST EXCISION     FOREHEAD   FOOT SURGERY     CYST   RIGHT/LEFT HEART CATH AND CORONARY ANGIOGRAPHY N/A 03/31/2020   Procedure: RIGHT/LEFT HEART CATH AND CORONARY ANGIOGRAPHY;  Surgeon: Wellington Hampshire, MD;  Location: [facility];  Service: Cardiovascular;  Laterality: N/A;   TUBAL LIGATION      SOCIAL HISTORY: Social History   Socioeconomic History   Marital status: Widowed    Spouse name: Not on file   Number of children: 4   Years of education: Not on file   Highest education level: Not on file  Occupational History   Occupation: Health and safety inspector  Tobacco Use   Smoking status: Every Day    Packs/day: 1.00    Years: 50.00    Pack years: 50.00    Types: Cigarettes   Smokeless tobacco: Never   Tobacco comments:    4-5 cigs per day 01/23/2021  Vaping Use   Vaping Use: Never used  Substance and Sexual Activity   Alcohol use: Not Currently   Drug use: Never   Sexual activity: Never    Birth  control/protection: None  Other Topics Concern   Not on file  Social History Narrative   [place]; Kristin [name]- smokes 4-5 cig/day; no alcohol. Used to work in [employer]. 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    Social Determinants of Health   Financial Resource Strain: Not on file  Food Insecurity: Not on file  Transportation Needs: Not on file  Physical Activity: Not on file  Stress: Not on file  Social Connections: Not on file  Intimate Partner Violence: Not on file    FAMILY HISTORY: Family History  Problem Relation Age of Onset   Heart disease Mother    Cancer Father     ALLERGIES:  is allergic to altace [ramipril].  MEDICATIONS:  Current Outpatient Medications  Medication Sig Dispense Refill   albuterol (PROVENTIL) (2.5 MG/3ML) 0.083% nebulizer solution Take 3 mLs (2.5 mg total) by nebulization every 4 (four) hours as needed for wheezing or shortness of breath. 75 mL 12  albuterol (VENTOLIN HFA) 108 (90 Base) MCG/ACT inhaler Inhale 2 puffs into the lungs every 4 (four) hours as needed for wheezing or shortness of breath.      apixaban (ELIQUIS) 5 MG TABS tablet Take 1 tablet (5 mg total) by mouth 2 (two) times daily. 60 tablet 11   atorvastatin (LIPITOR) 80 MG tablet Take 1 tablet (80 mg total) by mouth at bedtime. 90 tablet 3   docusate sodium (COLACE) 100 MG capsule Take 100 mg by mouth at bedtime as needed for mild constipation or moderate constipation.     famotidine (PEPCID) 40 MG tablet Take 40 mg by mouth at bedtime.     fenofibrate 160 MG tablet Take 160 mg by mouth daily.     ferrous sulfate 325 (65 FE) MG tablet Take 325 mg by mouth daily.     gabapentin (NEURONTIN) 600 MG tablet Take 600 mg by mouth 2 (two) times daily.      hydrOXYzine (VISTARIL) 50 MG capsule Take 50 mg by mouth every 6 (six) hours as needed for anxiety.     irbesartan (AVAPRO) 75 MG tablet Take 1 tablet (75 mg total) by mouth daily. 90 tablet 3   levothyroxine (SYNTHROID) 75 MCG tablet Take  75 mcg by mouth daily before breakfast.     lidocaine (LIDODERM) 5 % Place 3 patches onto the skin daily. (Remove after 12 hours and keep off for 12 hours)     Melatonin 10 MG TABS Take 10 mg by mouth at bedtime as needed (sleep).     nicotine (NICODERM CQ - DOSED IN MG/24 HOURS) 21 mg/24hr patch Place 1 patch (21 mg total) onto the skin daily. 28 patch 5   nystatin (MYCOSTATIN/NYSTOP) powder Apply topically 3 (three) times daily. 15 g 0   oxyCODONE-acetaminophen (PERCOCET) 10-325 MG tablet Take 1 tablet by mouth 4 (four) times daily as needed for pain.     pantoprazole (PROTONIX) 40 MG tablet Take 40 mg by mouth 2 (two) times daily.     sitaGLIPtin (JANUVIA) 100 MG tablet Take 100 mg by mouth daily.     Tiotropium Bromide-Olodaterol (STIOLTO RESPIMAT) 2.5-2.5 MCG/ACT AERS Inhale 2 puffs into the lungs daily. 1 each 11   Tiotropium Bromide-Olodaterol (STIOLTO RESPIMAT) 2.5-2.5 MCG/ACT AERS Inhale 2 puffs into the lungs daily. 4 g 0   torsemide (DEMADEX) 20 MG tablet Take 1 tablet (20 mg total) by mouth daily. Alternating with 20 mg by mouth every other day 90 tablet 1   Vitamin D, Ergocalciferol, (DRISDOL) 1.25 MG (50000 UNIT) CAPS capsule Take 50,000 Units by mouth once a week. Mondays     citalopram (CELEXA) 40 MG tablet Take 40 mg by mouth daily.     metFORMIN (GLUCOPHAGE) 500 MG tablet Take 500 mg by mouth 2 (two) times daily. (Patient not taking: Reported on 02/20/2021)     No current facility-administered medications for this visit.      Marland Kitchen  PHYSICAL EXAMINATION: ECOG PERFORMANCE STATUS: 2 - Symptomatic, <50% confined to bed  Vitals:   02/20/21 1402  BP: (!) 100/43  Pulse: 85  Temp: 97.8 F (36.6 C)  SpO2: 95%   Filed Weights   02/20/21 1402  Weight: 237 lb (107.5 kg)    Physical Exam Vitals and nursing note reviewed.  HENT:     Head: Normocephalic and atraumatic.     Mouth/Throat:     Pharynx: Oropharynx is clear.  Eyes:     Extraocular Movements: Extraocular  movements intact.  Pupils: Pupils are equal, round, and reactive to light.  Cardiovascular:     Rate and Rhythm: Normal rate and regular rhythm.  Pulmonary:     Comments: Decreased breath sounds bilaterally.  Abdominal:     Palpations: Abdomen is soft.  Musculoskeletal:        General: Normal range of motion.     Cervical back: Normal range of motion.  Skin:    General: Skin is warm.  Neurological:     General: No focal deficit present.     Mental Status: She is alert and oriented to person, place, and time.  Psychiatric:        Behavior: Behavior normal.        Judgment: Judgment normal.     LABORATORY DATA:  I have reviewed the data as listed Lab Results  Component Value Date   WBC 8.2 02/20/2021   HGB 9.3 (L) 02/20/2021   HCT 30.6 (L) 02/20/2021   MCV 91.3 02/20/2021   PLT 224 02/20/2021   Recent Labs    06/17/20 1238 06/27/20 0909 07/04/20 1242 01/17/21 1018 01/19/21 0529 01/20/21 0548 02/20/21 1506  NA 143 142   < > 143 139 138 137  K 3.6 4.3   < > 4.0 4.4 4.4 4.5  CL 100 96*   < > 101 97* 94* 96*  CO2 31 33*   < > 34* 34* 35* 35*  GLUCOSE 189* 125*   < > 213* 160* 164* 114*  BUN 18 34*   < > 21 42* 53* 32*  CREATININE 1.16* 1.76*   < > 1.38* 1.67* 1.68* 1.74*  CALCIUM 8.8* 8.6*   < > 9.2 9.0 8.8* 8.6*  GFRNONAA 51* 31*   < > 42* 33* 33* 32*  PROT 6.6 6.6   < > 7.7 6.6  --  6.7  ALBUMIN 3.6 3.6   < > 3.7 3.4*  --  3.3*  AST 26 18   < > 20 15  --  17  ALT 20 18   < > 15 14  --  12  ALKPHOS 33* 28*   < > 40 32*  --  34*  BILITOT 0.5 0.3   < > 0.6 0.6  --  0.4  BILIDIR <0.1 <0.1  --   --   --   --   --   IBILI NOT CALCULATED NOT CALCULATED  --   --   --   --   --    < > = values in this interval not displayed.    RADIOGRAPHIC STUDIES: I have personally reviewed the radiological images as listed and agreed with the findings in the report. NM PET Image Restag (PS) Skull Base To Thigh  Result Date: 02/04/2021 CLINICAL DATA:  Subsequent treatment  strategy for non-small cell lung cancer in a 70 year old female, assess treatment response. EXAM: NUCLEAR MEDICINE PET SKULL BASE TO THIGH TECHNIQUE: 11.99 mCi F-18 FDG was injected intravenously. Full-ring PET imaging was performed from the skull base to thigh after the radiotracer. CT data was obtained and used for attenuation correction and anatomic localization. Fasting blood glucose: 98 mg/dl COMPARISON:  PET exam from December of 2021 and most recent CT of the chest from January 14, 2021. FINDINGS: Mediastinal blood pool activity: SUV max 3.37 Liver activity: SUV max NA NECK: No hypermetabolic lymph nodes in the neck. Incidental CT findings: none CHEST: Adenopathy seen on recent chest imaging in the mediastinum and RIGHT hilum shows marked increased metabolic activity. (  Image 78/3) 2.5 cm RIGHT paratracheal lymph node may have enlarged slightly since the previous imaging study showing a maximum SUV of 11.57 RIGHT hilar nodal tissue (image 82/3) maximum SUV of 9.39 measuring approximately 2.0 cm is roughly stable compared to the previous CT. Difficult to separate from surrounding hilar structures however. RIGHT paratracheal lymph node (image 70/3) 12 mm with a maximum SUV of approximately 6.4 no effusion. Post treatment changes about the lesion in the RIGHT lung base without increased metabolic activity in the area of the previous primary tumor Incidental CT findings: Calcified atheromatous plaque of the thoracic aorta. Mitral annular calcifications and three-vessel coronary artery disease with cardiac enlargement as before. Central pulmonary vasculature mildly prominent. Limited assessment of cardiovascular structures given lack of intravenous contrast. Signs of pulmonary emphysema. No lobar consolidation or effusion. Tiny nodule seen on the recent chest CT in the RIGHT upper lobe not seen on current PET due to respiratory motion. Background pulmonary emphysema. ABDOMEN/PELVIS: No abnormal hypermetabolic  activity within the liver, pancreas, adrenal glands, or spleen. No hypermetabolic lymph nodes in the abdomen or pelvis. Incidental CT findings: No acute findings relative to the liver. Cholelithiasis without adjacent stranding. Pancreas, spleen, adrenal glands and kidneys without acute findings. Dense homogeneous 3.7 x 2.9 cm lesion arising from the upper pole the LEFT kidney shows no increased metabolic activity and is stable compared to the prior PET exam from December of 2021. Fifty-three Hounsfield unit density remains nonspecific however. No acute gastrointestinal process. Appendix is normal. Aortic atherosclerosis in the abdominal aorta without aneurysm. No adnexal mass. SKELETON: No focal hypermetabolic activity to suggest skeletal metastasis. Incidental CT findings: Spinal degenerative changes. Incompletely united impacted and angulated RIGHT proximal humeral fracture. Initially seen on the exam of April of 2022. IMPRESSION: Signs of metastatic disease involving mediastinal and RIGHT hilar lymph nodes. Subcarinal lymph nodes without increased metabolic activity currently. Post treatment changes in the RIGHT chest as described. Renal lesion without FDG accumulation compatible with benign or indolent process. Nonspecific in the setting of renal mass. More likely hemorrhagic cyst with enlargement though would suggest follow-up renal protocol CT in 6 months to exclude the possibility of indolent renal neoplasm. Incompletely united impacted RIGHT humeral fracture. Correlate with symptoms. Minimal bony bridging seen on limited images, correlate with symptoms and with follow-up as warranted. Aortic Atherosclerosis (ICD10-I70.0) and Emphysema (ICD10-J43.9). Electronically Signed   By: Zetta Bills M.D.   On: 02/04/2021 09:16   LONG TERM MONITOR-LIVE TELEMETRY (3-14 DAYS)  Result Date: 02/14/2021 Event Monitor Patch Wear Time:  9 days and 2 hours (2022-08-24T15:14:12-398 to 2022-09-02T17:28:39-0400) Patient  had a min HR of 41 bpm, max HR of 194 bpm, and avg HR of 81 bpm. Predominant underlying rhythm was Sinus Rhythm. 2 Ventricular Tachycardia runs occurred, the run with the fastest interval lasting 12 beats with a max rate of 185 bpm (avg 173 bpm);  the run with the fastest interval was also the longest. Episodes of Ventricular Tachycardia may be possible Atrial Fibrillation with aberrancy. 531 Supraventricular Tachycardia runs occurred, the run with the fastest interval lasting 4 beats with a max rate of 194 bpm, the longest lasting 11 beats with an avg rate of 130 bpm.  Atrial Fibrillation/Flutter occurred (10% burden), ranging from 80-185 bpm (avg of 118 bpm), the longest lasting 58 mins 49 secs with an avg rate of 122 bpm. 19 Pauses occurred, the longest lasting 4.1 secs (15 bpm). Junctional Rhythm was present. Isolated SVEs were occasional (1.6%, 16996), SVE Couplets were rare (<  1.0%, 3383), and SVE Triplets were rare (<1.0%, 978). Isolated VEs were occasional (2.3%, [id number]), VE Couplets were rare (<1.0%, 2609), and no VE Triplets were present. Signed, Esmond Plants, MD, Ph.D [facility]    ASSESSMENT & PLAN:   Primary cancer of right lower lobe of lung ([place]) #Squamous cell carcinoma right lower lobe [December 2021]; status post SBRT -CT scan PET scan September 2022  imaging findings highly concerning for recurrent malignancy-hold off biopsy given patient's tenuous respiratory status.  Check NGS.  Patient is brain MRI pending today-radiation oncology [place].  #Had a long discussion with patient and daughter regarding-patient's treatment options are limited.  Unfortunately treatment options are palliative and not curative.  Surgery is not an option; radiation therapy is also not an option given patient's tenuous respiratory status.  Reviewed the notes from Dr. Sondra Come.  #Systemic therapy options include chemotherapy/immunotherapy.  Patient is a poor candidate for systemic chemotherapy given multiple  comorbidities peripheral neuropathy, poorly controlled diabetes/borderline performance status cardiorespiratory illness.  # I discussed the mechanism of action; The goal of therapy is palliative; and length of treatments are likely ongoing/based upon the results of the scans.  Recommend Keytruda IV infusion every 3 weeks.  Discussed the potential side effects of immunotherapy including but not limited to diarrhea; skin rash; elevated LFTs/endocrine abnormalities etc.  #Also discussed option of hospice if patient chooses not to proceed with any systemic therapy; patient's life expectancy would depend upon response to therapy/and also tolerance to therapy.  At this time patient is interested in proceeding with therapy.  # Smoking: Active smoker/not interested in smoking cessation.  # COPD-on 2 L of oxygen/CHF/A. fib on Eliquis-stable.  #Poorly controlled diabetes/peripheral neuropathy.; CKD-Stage III- STABLE.  Thank you Dr.Kinard for allowing me to participate in the care of your pleasant patient. Please do not hesitate to contact me with questions or concerns in the interim.  Discussed with [facility].  Also establish care with palliative care.  # DISPOSITION: # labs today # chemo education- Beryle Flock # follow up in week of 17th- MD; labs- cbc/cmp-Keytrda [new]- Dr.B  # I reviewed the blood work- with the patient in detail; also reviewed the imaging independently [as summarized above]; and with the patient in detail.     All questions were answered. The patient knows to call the clinic with any problems, questions or concerns.       Cammie Sickle, MD 02/20/2021 4:54 PM

## 2021-02-20 NOTE — Assessment & Plan Note (Addendum)
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  Check NGS.  Patient is brain MRI pending today-radiation oncology [place].  #Had a long discussion with patient and daughter regarding-patient's treatment options are limited.  Unfortunately treatment options are palliative and not curative.  Surgery is not an option; radiation therapy is also not an option given patient's tenuous respiratory status.  Reviewed the notes from Dr. Sondra Come.  #Systemic therapy options include chemotherapy/immunotherapy.  Patient is a poor candidate for systemic chemotherapy given multiple comorbidities peripheral neuropathy, poorly controlled diabetes/borderline performance status cardiorespiratory illness.  # I discussed the mechanism of action; The goal of therapy is palliative; and length of treatments are likely ongoing/based upon the results of the scans.  Recommend Keytruda IV infusion every 3 weeks.  Discussed the potential side effects of immunotherapy including but not limited to diarrhea; skin rash; elevated LFTs/endocrine abnormalities etc.  #Also discussed option of hospice if patient chooses not to proceed with any systemic therapy; patient's life expectancy would depend upon response to therapy/and also tolerance to therapy.  At this time patient is interested in proceeding with therapy.  # Smoking: Active smoker/not interested in smoking cessation.  # COPD-on 2 L of oxygen/CHF/A. fib on Eliquis-stable.  #Poorly controlled diabetes/peripheral neuropathy.; CKD-Stage III- STABLE.  Thank you Dr.Kinard for allowing me to participate in the care of your pleasant patient. Please do not hesitate to contact me with questions or concerns in the interim.  Discussed with [facility].  Also establish care with palliative care.  #  DISPOSITION: # labs today # chemo education- Beryle Flock # follow up in week of 17th- MD; labs- cbc/cmp-Keytrda [new]- Dr.B  # I reviewed the blood work- with the patient in detail; also reviewed the imaging independently [as summarized above]; and with the patient in detail.   Addendum: 10/27-NGS/foundation 1 reviewed-TPS 1%. Negative for any targetable/driver mutations.  patients with TPS-1% or greater-FDA approved Keytruda. GB

## 2021-02-21 LAB — CEA: CEA: 5.7 ng/mL — ABNORMAL HIGH (ref 0.0–4.7)

## 2021-02-23 NOTE — Progress Notes (Signed)
Pharmacist Chemotherapy Monitoring - Initial Assessment    Anticipated start date: 03/02/21   The following has been reviewed per standard work regarding the patient's treatment regimen: The patient's diagnosis, treatment plan and drug doses, and organ/hematologic function Lab orders and baseline tests specific to treatment regimen  The treatment plan start date, drug sequencing, and pre-medications Prior authorization status  Patient's documented medication list, including drug-drug interaction screen and prescriptions for anti-emetics and supportive care specific to the treatment regimen The drug concentrations, fluid compatibility, administration routes, and timing of the medications to be used The patient's access for treatment and lifetime cumulative dose history, if applicable  The patient's medication allergies and previous infusion related reactions, if applicable   Changes made to treatment plan:  treatment plan date  Follow up needed:  Pending authorization for treatment  and signing treatment plan   Adelina Mings, [place], 02/23/2021  10:07 AM

## 2021-02-24 NOTE — Progress Notes (Deleted)
Electrophysiology Office Note:    Date:  02/24/2021   ID:  Drucilla Chalet Dyas, DOB 1950-10-17, MRN [id number]  PCP:  [place]  [facility] Cardiologist:  Ida Rogue, MD  [place] Electrophysiologist:  None   Referring MD: Wilder Glade*   Chief Complaint: Atrial fibrillation  History of Present Illness:    Kristin [name] is a 70 y.o. female who presents for an evaluation of atrial fibrillation at the request of Jorja Loa, NP. Their medical history includes chronic diastolic heart failure, asthma, coronary artery disease, COPD, diabetes, moderate aortic stenosis, paroxysmal atrial fibrillation and sleep apnea.  The patient was last seen by Jorja Loa on February 06, 2021.  She is on home oxygen for her COPD.  She is worn heart monitors in the past which show 100% atrial fibrillation burden.  She has bradycardic episodes with pauses lasting greater than 4 seconds.  She also has tachycardic episodes.  For her aortic valve stenosis, she is felt to be a TAVR candidate but not a surgical repair candidate.  Past Medical History:  Diagnosis Date   (HFpEF) heart failure with preserved ejection fraction ([place])    a. 09/2020 Echo: EF >55%; b. 10/2020 Echo: EF 55-60%. RVSP 75mHg.   Anxiety    Asthma    CAD (coronary artery disease)    a. 03/2020 Cath: LM nl, LAD 20, LCX 60p, 280m, RCA 30p, 4045mPDA 30-->Med Rx.   COPD (chronic obstructive pulmonary disease) (HCC)    Diabetes mellitus without complication (HCC)    Dyspnea    GERD (gastroesophageal reflux disease)    History of hiatal hernia    History of orthopnea    History of radiation therapy 07/29/20-08/08/20   Right Lung, SBRT Dr. JamGery PrayHypothyroidism    Moderate aortic stenosis    a. 03/2020 Echo: Mod-sev AS. AVA 1.11cm^2. Mean grad 33m34m b. 10/2020 Echo: EF 55-60%, no rwma, nl RV fxn, RVSP 30mm58mmild BAE, mod-sev mitral annular Ca2+ w/o MS/MR. Mod-sev AS. AVA 0.91cm^2 (VTI), mean grad 29mmH83m  Neuropathy    Oxygen deficiency    3L/HS   PAF (paroxysmal atrial fibrillation) (HCC)  [place]. Post-termination pauses. Prev on amio.  CHA2DS2VASc 6-->eliqis; b. 01/2021 Zio: Predominantly RSR w/ avg HR 81 (41-194). 2 runs NSVT vs AF w/ aberrancy (longest 12 beats @ 185). 531 SVT runs (longest 11 beats; fastest 194). 10% Afib burden (80-185 bpm, avg 118). Longest Afib 58m49s84m pauses - longest 4.1secs - jxnl rhythm present. Isolated PACs (1.6%).   Pain    BACK/DDD   Peripheral vascular disease (HCC)    RLS (restless legs syndrome)    Sleep apnea    Wheezing     Past Surgical History:  Procedure Laterality Date   BREAST SURGERY     CATARACT EXTRACTION W/PHACO Right 03/02/2018   Procedure: CATARACT EXTRACTION PHACO AND INTRAOCULAR LENS PLACEMENT (IOC);  [place]: Harrow,Marchia Meiers[facility];  Service: Ophthalmology;  Laterality: Right;  US 01:1[place]oreaCDE 16.46 Fluid pack lot # [id number] EXTRACTION W/PHACO Left 04/27/2018   Procedure: CATARACT EXTRACTION PHACO AND INTRAOCULAR LENS PLACEMENT (IOC)- [place] DI[facility];  Surgeon: Harrow,Marchia MeiersLocation: [facility];  Service: Ophthalmology;  Laterality: Left;  Lot # [id number]:[place].3 CDE: 8.07    CYST EXCISION     FOREHEAD   FOOT SURGERY     CYST   RIGHT/LEFT HEART CATH AND CORONARY ANGIOGRAPHY N/A 03/31/2020  Procedure: RIGHT/LEFT HEART CATH AND CORONARY ANGIOGRAPHY;  Surgeon: Wellington Hampshire, MD;  Location: [facility];  Service: Cardiovascular;  Laterality: N/A;   TUBAL LIGATION      Current Medications: No outpatient medications have been marked as taking for the 02/25/21 encounter (Appointment) with Vickie Epley, MD.     Allergies:   Altace [ramipril]   Social History   Socioeconomic History   Marital status: Widowed    Spouse name: Not on file   Number of children: 4   Years of education: Not on file   Highest education level: Not on file  Occupational History   Occupation:  Health and safety inspector  Tobacco Use   Smoking status: Every Day    Packs/day: 1.00    Years: 50.00    Pack years: 50.00    Types: Cigarettes   Smokeless tobacco: Never   Tobacco comments:    4-5 cigs per day 01/23/2021  Vaping Use   Vaping Use: Never used  Substance and Sexual Activity   Alcohol use: Not Currently   Drug use: Never   Sexual activity: Never    Birth control/protection: None  Other Topics Concern   Not on file  Social History Narrative   [place]; [name]- smokes 4-5 cig/day; no alcohol. Used to work in [employer]. 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    Social Determinants of Health   Financial Resource Strain: Not on file  Food Insecurity: Not on file  Transportation Needs: Not on file  Physical Activity: Not on file  Stress: Not on file  Social Connections: Not on file     Family History: The patient's family history includes Cancer in her father; Heart disease in her mother.  ROS:   Please see the history of present illness.    All other systems reviewed and are negative.  EKGs/Labs/Other Studies Reviewed:    The following studies were reviewed today:  February 14, 2021 ZIO monitor personally reviewed Heart rate 41-194, average 81 10% burden of atrial fibrillation flutter with an average rate of 118 bpm 19 pauses, longest lasting 4.1 seconds Junctional rhythm was present  October 24, 2020 echo Left ventricular function normal, 55% Right ventricular function normal Mildly dilated left and right atria Moderate to severe aortic stenosis Moderate to severe MAC       EKG:  The ekg ordered today demonstrates ***  Recent Labs: 01/17/2021: TSH 0.802 01/18/2021: B Natriuretic Peptide 986.7 01/20/2021: Magnesium 2.0 02/20/2021: ALT 12; BUN 32; Creatinine, Ser 1.74; Hemoglobin 9.3; Platelets 224; Potassium 4.5; Sodium 137  Recent Lipid Panel    Component Value Date/Time   CHOL 165 06/27/2020 0909   TRIG 64 06/27/2020 0909   HDL 61 06/27/2020 0909    CHOLHDL 2.7 06/27/2020 0909   VLDL 13 06/27/2020 0909   LDLCALC 91 06/27/2020 0909    Physical Exam:    VS:  LMP  (LMP Unknown)     Wt Readings from Last 3 Encounters:  02/20/21 237 lb (107.5 kg)  02/10/21 236 lb (107 kg)  02/06/21 233 lb 6.4 oz (105.9 kg)     GEN: *** Well nourished, well developed in no acute distress HEENT: Normal NECK: No JVD; No carotid bruits LYMPHATICS: No lymphadenopathy CARDIAC: ***RRR, no murmurs, rubs, gallops RESPIRATORY:  Clear to auscultation without rales, wheezing or rhonchi  ABDOMEN: Soft, non-tender, non-distended MUSCULOSKELETAL:  No edema; No deformity  SKIN: Warm and dry NEUROLOGIC:  Alert and oriented x 3 PSYCHIATRIC:  Normal affect  ASSESSMENT:    No diagnosis found. PLAN:    In order of problems listed above:  Nothing? Pace and ablate? Not an ablation candidate Not a good long-term amiodarone candidate because of oxygen use and COPD     Total time spent with patient today *** minutes. This includes reviewing records, evaluating the patient and coordinating care.  Medication Adjustments/Labs and Tests Ordered: Current medicines are reviewed at length with the patient today.  Concerns regarding medicines are outlined above.  No orders of the defined types were placed in this encounter.  No orders of the defined types were placed in this encounter.    Signed, Hilton Cork. Quentin Ore, MD, [facility], [facility] 02/24/2021 9:50 PM    Electrophysiology  [facility]

## 2021-02-25 ENCOUNTER — Institutional Professional Consult (permissible substitution): Payer: Medicare Other | Admitting: Cardiology

## 2021-02-26 ENCOUNTER — Other Ambulatory Visit: Payer: Self-pay

## 2021-02-26 ENCOUNTER — Inpatient Hospital Stay: Payer: Medicare Other

## 2021-03-02 ENCOUNTER — Other Ambulatory Visit: Payer: Medicare Other

## 2021-03-02 ENCOUNTER — Ambulatory Visit: Payer: Medicare Other | Admitting: Internal Medicine

## 2021-03-02 ENCOUNTER — Ambulatory Visit: Payer: Medicare Other

## 2021-03-05 ENCOUNTER — Encounter: Payer: Self-pay | Admitting: Internal Medicine

## 2021-03-09 ENCOUNTER — Encounter: Payer: Self-pay | Admitting: Internal Medicine

## 2021-03-09 ENCOUNTER — Ambulatory Visit (INDEPENDENT_AMBULATORY_CARE_PROVIDER_SITE_OTHER): Payer: Medicare Other | Admitting: Cardiovascular Disease

## 2021-03-09 ENCOUNTER — Encounter: Payer: Self-pay | Admitting: Cardiovascular Disease

## 2021-03-09 ENCOUNTER — Other Ambulatory Visit: Payer: Self-pay

## 2021-03-09 VITALS — BP 110/52 | HR 84 | Ht 62.0 in | Wt 234.0 lb

## 2021-03-09 DIAGNOSIS — E1122 Type 2 diabetes mellitus with diabetic chronic kidney disease: Secondary | ICD-10-CM

## 2021-03-09 DIAGNOSIS — I480 Paroxysmal atrial fibrillation: Secondary | ICD-10-CM

## 2021-03-09 DIAGNOSIS — I5032 Chronic diastolic (congestive) heart failure: Secondary | ICD-10-CM

## 2021-03-09 DIAGNOSIS — Z720 Tobacco use: Secondary | ICD-10-CM

## 2021-03-09 DIAGNOSIS — I495 Sick sinus syndrome: Secondary | ICD-10-CM

## 2021-03-09 DIAGNOSIS — J449 Chronic obstructive pulmonary disease, unspecified: Secondary | ICD-10-CM

## 2021-03-09 DIAGNOSIS — I10 Essential (primary) hypertension: Secondary | ICD-10-CM

## 2021-03-09 DIAGNOSIS — N1832 Chronic kidney disease, stage 3b: Secondary | ICD-10-CM

## 2021-03-09 DIAGNOSIS — I350 Nonrheumatic aortic (valve) stenosis: Secondary | ICD-10-CM

## 2021-03-09 DIAGNOSIS — I2510 Atherosclerotic heart disease of native coronary artery without angina pectoris: Secondary | ICD-10-CM

## 2021-03-09 MED ORDER — TORSEMIDE 20 MG PO TABS: 20.0000 mg | ORAL_TABLET | Freq: Every day | ORAL | 3 refills | Status: AC

## 2021-03-09 MED ORDER — DILTIAZEM HCL 30 MG PO TABS
30.0000 mg | ORAL_TABLET | Freq: Three times a day (TID) | ORAL | 3 refills | Status: AC | PRN
Start: 2021-03-09 — End: ?

## 2021-03-09 NOTE — Progress Notes (Signed)
Cardiology Office Note  Date:  03/09/2021   ID:  Kristin [name], DOB [birth year], MRN [id number]  PCP:  [place], [place]   Chief Complaint  Patient presents with   2 month follow up     Patient c/o shortness of breath. Medications reviewed by the patient's daughter [name]) verbally.     HPI:  70 y.o. 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,  diastolic CHF,  severe aortic valve stenosis and current TAVR/CTS evaluation,  PAF with post-termination pauses and recommendation to avoid AV nodal blocking agents on amiodarone and OAC,  hypertension,  chronic respiratory failure on home O2 at 3 L nasal cannula oxygen (5L as of 4/2),  tobacco use, COPD, OSA,  hypothyroidism,  DM2, Lung cancer, completed radiation, scheduled for immunotherapy Presents for aortic valve stenosis, diastolic CHF  LOV with myself 10/2020 Seen by one of our providers 01/2021  readmitted September 3 to January 21 2021 secondary to severe dyspnea and hypoxia with saturations in the 60s on room air.  In that setting, she had altered mental status.  In the ED, she was transitioned to BiPAP with rapid improvement.  She was treated with steroids for COPD exacerbation.     echo in June 2022 showed an EF of 55-60%, moderate LVH, and moderate to severe arctic stenosis with a mean gradient of 29 mmHg, and peak gradient of 48 mmHg.  Aortic valve area was measured at 0.85 cm (Vmax).  Zio monitor, which showed a 10% A. fib burden.  She was also noted to have 2 runs of nonsustained VT versus A. fib with aberrancy, 531 brief runs of SVT, frequent PACs, and 19 pauses, the longest of which was 4.1 seconds with junctional rhythm present.  Pet scan Signs of metastatic disease involving mediastinal and RIGHT hilar lymph nodes.  Fatigue, sleepy, chronic cough Torsemide 20 QOD and extra as needed BMp stable   amiodarone and beta-blocker therapy but these were discontinued in the setting of presyncope, bradycardia,  and hypotension back in August. 2022  Still smoking, <1 ppd  Palpitations, has to lay down, thinks itr is atrial fib Eventuially goes away  EKG personally reviewed by myself on todays visit Normal sinus rhythm rate 84 bpm no significant ST-T wave changes  Of past medical history reviewed admission 07/04/2018 acute COPD CHF, successful IV diuresis.  03/2020 in [facility] ED for NSTEMI.   Echo showed nl EF, mild LVH, G1DD, mild LAE, and moderate to severe AS.   R/LHC. moderate nonobstructive CAD with stenosis being 60% of pLAD.  Right heart cath moderate PHTN, moderately reduced CO.  Severe aortic stenosis noted with mean gradient 56mmHg and calcified valve 0.7cm2.   TAVR workup with Cardiac CTA noted a 1.9x1.4x1.6cm mass in super segmet of RLL suspicious for lung CA.   Subsequent PET scan showed 2.1x1.4cm RLL nodule that was hypermetabolic and compatible with malignancy with no appreciated nodal or mets.    outpatient MRI of the brain to ensure no METs to the brain from her lung cancer.   in the MRI machine developed CP, abdominal pain, and emesis.  noted to be in atrial flutter then converted to a junctional rhythm with 4.1 second termination pauses. Coreg was discontinued 2/2 termination pauses  ambulatory monitoring  recurrent Afib noted  Eliquis 5mg  BID start. on amiodarone.    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  MRI of the brain was fortunately without intracranial mets.  CTS recommendation was  for workup/tx of SCC per oncology.    still smoking  1.5 packs/day to 1 pack/day.     PMH:   has a past medical history of (HFpEF) heart failure with preserved ejection fraction (HCC), Anxiety, Asthma, CAD (coronary artery disease), COPD (chronic obstructive pulmonary disease) ([place]), Diabetes mellitus without complication ([place]), Dyspnea, GERD (gastroesophageal reflux disease), History of hiatal hernia, History of orthopnea, History of radiation therapy (07/29/20-08/08/20),  Hypothyroidism, Moderate aortic stenosis, Neuropathy, Oxygen deficiency, PAF (paroxysmal atrial fibrillation) ([place]), Pain, Peripheral vascular disease ([place]), RLS (restless legs syndrome), Sleep apnea, and Wheezing.  PSH:    Past Surgical History:  Procedure Laterality Date   BREAST SURGERY     CATARACT EXTRACTION W/PHACO Right 03/02/2018   Procedure: CATARACT EXTRACTION PHACO AND INTRAOCULAR LENS PLACEMENT ([place]);  Surgeon: Marchia Meiers, MD;  Location: [facility];  Service: Ophthalmology;  Laterality: Right;  [place] 01:15 CDE 16.46 Fluid pack lot # [id number]   CATARACT EXTRACTION W/PHACO Left 04/27/2018   Procedure: CATARACT EXTRACTION PHACO AND INTRAOCULAR LENS PLACEMENT ([place])- LEFT DIABETIC;  Surgeon: Marchia Meiers, MD;  Location: [facility];  Service: Ophthalmology;  Laterality: Left;  Lot # [id number] H [place]: 00:46.3 CDE: 8.07    CYST EXCISION     FOREHEAD   FOOT SURGERY     CYST   RIGHT/LEFT HEART CATH AND CORONARY ANGIOGRAPHY N/A 03/31/2020   Procedure: RIGHT/LEFT HEART CATH AND CORONARY ANGIOGRAPHY;  Surgeon: Wellington Hampshire, MD;  Location: [place] [facility];  Service: Cardiovascular;  Laterality: N/A;   TUBAL LIGATION      Current Outpatient Medications  Medication Sig Dispense Refill   albuterol (PROVENTIL) (2.5 MG/3ML) 0.083% nebulizer solution Take 3 mLs (2.5 mg total) by nebulization every 4 (four) hours as needed for wheezing or shortness of breath. 75 mL 12   albuterol (VENTOLIN HFA) 108 (90 Base) MCG/ACT inhaler Inhale 2 puffs into the lungs every 4 (four) hours as needed for wheezing or shortness of breath.      apixaban (ELIQUIS) 5 MG TABS tablet Take 1 tablet (5 mg total) by mouth 2 (two) times daily. 60 tablet 11   atorvastatin (LIPITOR) 80 MG tablet Take 1 tablet (80 mg total) by mouth at bedtime. 90 tablet 3   citalopram (CELEXA) 40 MG tablet Take 40 mg by mouth daily.     diltiazem (CARDIZEM) 30 MG tablet Take 1 tablet (30 mg total) by mouth 3 (three) times daily as needed.  For A-fib breakthrough 90 tablet 3   docusate sodium (COLACE) 100 MG capsule Take 100 mg by mouth at bedtime as needed for mild constipation or moderate constipation.     famotidine (PEPCID) 40 MG tablet Take 40 mg by mouth at bedtime.     fenofibrate 160 MG tablet Take 160 mg by mouth daily.     ferrous sulfate 325 (65 FE) MG tablet Take 325 mg by mouth daily.     gabapentin (NEURONTIN) 600 MG tablet Take 600 mg by mouth 2 (two) times daily.      hydrOXYzine (VISTARIL) 50 MG capsule Take 50 mg by mouth every 6 (six) hours as needed for anxiety.     irbesartan (AVAPRO) 75 MG tablet Take 1 tablet (75 mg total) by mouth daily. 90 tablet 3   levothyroxine (SYNTHROID) 75 MCG tablet Take 75 mcg by mouth daily before breakfast.     lidocaine (LIDODERM) 5 % Place 3 patches onto the skin daily. (Remove after 12 hours and keep off for 12 hours)  Melatonin 10 MG TABS Take 10 mg by mouth at bedtime as needed (sleep).     metFORMIN (GLUCOPHAGE) 500 MG tablet Take 500 mg by mouth 2 (two) times daily.     nicotine (NICODERM CQ - DOSED IN MG/24 HOURS) 21 mg/24hr patch Place 1 patch (21 mg total) onto the skin daily. 28 patch 5   nystatin (MYCOSTATIN/NYSTOP) powder Apply topically 3 (three) times daily. 15 g 0   oxyCODONE-acetaminophen (PERCOCET) 10-325 MG tablet Take 1 tablet by mouth 5 (five) times daily as needed for pain.     pantoprazole (PROTONIX) 40 MG tablet Take 40 mg by mouth 2 (two) times daily.     sitaGLIPtin (JANUVIA) 100 MG tablet Take 100 mg by mouth daily.     Tiotropium Bromide-Olodaterol (STIOLTO RESPIMAT) 2.5-2.5 MCG/ACT AERS Inhale 2 puffs into the lungs daily. 1 each 11   Tiotropium Bromide-Olodaterol (STIOLTO RESPIMAT) 2.5-2.5 MCG/ACT AERS Inhale 2 puffs into the lungs daily. 4 g 0   Vitamin D, Ergocalciferol, (DRISDOL) 1.25 MG (50000 UNIT) CAPS capsule Take 50,000 Units by mouth once a week. Mondays     torsemide (DEMADEX) 20 MG tablet Take 1 tablet (20 mg total) by mouth daily.  Alternating with 20 mg by mouth every other day 90 tablet 3   No current facility-administered medications for this visit.     Allergies:   Altace [ramipril]   Social History:  The patient  reports that she has been smoking cigarettes. She has a 50.00 pack-year smoking history. She has never used smokeless tobacco. She reports that she does not currently use alcohol. She reports that she does not use drugs.   Family History:   family history includes Cancer in her father; Heart disease in her mother.    Review of Systems: Review of Systems  Constitutional: Negative.   Respiratory:  Positive for shortness of breath and wheezing.   Cardiovascular: Negative.   Gastrointestinal: Negative.   Musculoskeletal: Negative.   Neurological: Negative.   Psychiatric/Behavioral: Negative.    All other systems reviewed and are negative.   PHYSICAL EXAM: VS:  BP (!) 110/52 (BP Location: Left Arm, Patient Position: Sitting, Cuff Size: Normal)   Pulse 84   Ht 5\' 2"  (1.575 m)   Wt 234 lb (106.1 kg)   LMP  (LMP Unknown)   SpO2 95% Comment: on oxygen 3 Liters  BMI 42.80 kg/m  , BMI Body mass index is 42.8 kg/m. Constitutional:  oriented to person, place, and time. No distress.  Obese, wheezing, sitting in wheelchair HENT:  Head: Grossly normal Eyes:  no discharge. No scleral icterus.  Neck: No JVD, no carotid bruits  Cardiovascular: Regular rate and rhythm, 2-1/3 systolic ejection murmur appreciated right sternal border Pulmonary/Chest: Clear to auscultation bilaterally, no wheezes or rails Abdominal: Soft.  no distension.  no tenderness.  Musculoskeletal: Normal range of motion Neurological:  normal muscle tone. Coordination normal. No atrophy Skin: Skin warm and dry Psychiatric: normal affect, pleasant   Recent Labs: 01/17/2021: TSH 0.802 01/18/2021: B Natriuretic Peptide 986.7 01/20/2021: Magnesium 2.0 02/20/2021: ALT 12; BUN 32; Creatinine, Ser 1.74; Hemoglobin 9.3; Platelets 224;  Potassium 4.5; Sodium 137    Lipid Panel Lab Results  Component Value Date   CHOL 165 06/27/2020   HDL 61 06/27/2020   LDLCALC 91 06/27/2020   TRIG 64 06/27/2020      Wt Readings from Last 3 Encounters:  03/09/21 234 lb (106.1 kg)  02/20/21 237 lb (107.5 kg)  02/10/21 236 lb (107 kg)  ASSESSMENT AND PLAN:  Problem List Items Addressed This Visit       Cardiology Problems   Essential hypertension   Relevant Medications   torsemide (DEMADEX) 20 MG tablet   diltiazem (CARDIZEM) 30 MG tablet   Other Relevant Orders   EKG 12-Lead   Aortic stenosis-moderate to severe 03/2020   Relevant Medications   torsemide (DEMADEX) 20 MG tablet   diltiazem (CARDIZEM) 30 MG tablet   PAF (paroxysmal atrial fibrillation) (HCC)   Relevant Medications   torsemide (DEMADEX) 20 MG tablet   diltiazem (CARDIZEM) 30 MG tablet   CAD (coronary artery disease)   Relevant Medications   torsemide (DEMADEX) 20 MG tablet   diltiazem (CARDIZEM) 30 MG tablet     Other   Tobacco use   Other Visit Diagnoses     Chronic heart failure with preserved ejection fraction (HFpEF) (HCC)    -  Primary   Relevant Medications   torsemide (DEMADEX) 20 MG tablet   diltiazem (CARDIZEM) 30 MG tablet   Other Relevant Orders   EKG 12-Lead   Type 2 diabetes mellitus with stage 3b chronic kidney disease, without long-term current use of insulin (HCC)       Chronic obstructive pulmonary disease, unspecified COPD type (HCC)       Sick sinus syndrome (HCC)       Relevant Medications   torsemide (DEMADEX) 20 MG tablet   diltiazem (CARDIZEM) 30 MG tablet      COPD exacerbation Strongly recommend she stop smoking On oxygen Recommend she continue her nebulizers, significant wheezing on exam though she reports this is her baseline  Lung cancer Completed radiation Scheduled for immunotherapy to start 2 weeks  Aortic valve stenosis Very sedentary, limited secondary to COPD Monitored by [facility] clinic [facility] Aortic valve area, by VTI measures 0.91 cm. Aortic valve mean gradient measures  29.0 mmHg.  Very inactive, does not appear to have symptoms at this time  Diastolic CHF Recommend torsemide 20 every other day, extra torsemide as needed for ankle swelling Ankles today with minimal edema  Atrial fibrillation, paroxysmal On Eliquis  High risk of recurrent arrhythmia, 10% A. fib noted on monitor  On the put on rate controlling agents on a regular basis given bradycardia, junctional rhythm Has been previously referred to EP, did not follow through Reports she is asymptomatic, no near syncope or syncope Recommend she take diltiazem 30 mg as needed for breakthrough tachypalpitations consistent with atrial fibrillation Currently just lays on the bed until symptoms go away   Total encounter time more than 35 minutes  Greater than 50% was spent in counseling and coordination of care with the patient    Signed, Esmond Plants, M.D., Ph.D. [place], [place]

## 2021-03-09 NOTE — Patient Instructions (Addendum)
Medication Instructions:  Take diltiazem 30 mg  As needed for atrial fib spells May take up to 3 times a day as needed  If you need a refill on your cardiac medications before your next appointment, please call your pharmacy.   Lab work: No new labs needed  Testing/Procedures: No new testing needed  Follow-Up: At [facility], you and your health needs are our priority.  As part of our continuing mission to provide you with exceptional heart care, we have created designated Provider Care Teams.  These Care Teams include your primary Cardiologist (physician) and Advanced Practice Providers (APPs -  Physician Assistants and Nurse Practitioners) who all work together to provide you with the care you need, when you need it.  You will need a follow up appointment in 6 months  Providers on your designated Care Team:   Murray Hodgkins, NP Christell Faith, PA-C Marrianne Mood, PA-C Cadence [place], [place]  COVID-19 Vaccine Information can be found at: ShippingScam.co.uk For questions related to vaccine distribution or appointments, please email vaccine@[email or web address] .[email or web address] or call [phone].

## 2021-03-13 ENCOUNTER — Telehealth: Payer: Self-pay | Admitting: Internal Medicine

## 2021-03-13 NOTE — Telephone Encounter (Signed)
FYI

## 2021-03-13 NOTE — Telephone Encounter (Signed)
Son called in to cancel appt for 10-31. Pt has covid. Please call to reschedule at [phone]

## 2021-03-13 NOTE — Telephone Encounter (Signed)
Spoke to patient's daughter.  The patient as well as her daughter and granddaughter tested positive for COVID on Tuesday.  They have started medication for COVID on Wednesday and slowly improving.  Dr. B would like to reschedule the 10/31 appointments for 2 weeks.    Daughter is aware that she will receive a call with new appointment information on Monday.

## 2021-03-16 ENCOUNTER — Inpatient Hospital Stay: Payer: Medicare Other | Admitting: Internal Medicine

## 2021-03-16 ENCOUNTER — Inpatient Hospital Stay: Payer: Medicare Other

## 2021-03-17 NOTE — Progress Notes (Signed)
Electrophysiology Office Note:    Date:  03/18/2021   ID:  Kristin [name], DOB [birth year], MRN [id number]  PCP:  [place] Cardiologist:  Ida Rogue, MD  [facility] Electrophysiologist:  Vickie Epley, MD   Referring MD: Wilder Glade*   Chief Complaint: AF  History of Present Illness:    Kristin [name] is a 70 y.o. female who presents for an evaluation of AF at the request of Dr Rockey Situ. Their medical history includes SCC of RLL, diastolic HF, severe AS undergoing TAVR evaluation, pAF, chronic respiratory failure on home oxygen, COPD, OSA, DM. She last saw Dr Rockey Situ 03/09/2021. At that appointment she was very inactive by history. Minimal symptoms related to her AF. Previously on amiodarone but this has been discontinued due to pauses and bradycardia.  She is with her daughter today in clinic.  They both had COVID last week but received therapy and have since tested negative.  They are both feeling better.  She tells me she really cannot appreciate significant symptoms when she is in atrial fibrillation.  Sometimes she will sense her heart beating more strongly than usual.  No presyncope or syncope.  Her oxygen requirements have been stable.  She takes Eliquis without bleeding issues.     Past Medical History:  Diagnosis Date   (HFpEF) heart failure with preserved ejection fraction ([place])    a. 09/2020 Echo: EF >55%; b. 10/2020 Echo: EF 55-60%. RVSP 64mHg.   Anxiety    Asthma    CAD (coronary artery disease)    a. 03/2020 Cath: LM nl, LAD 20, LCX 60p, 275m, RCA 30p, 4041mPDA 30-->Med Rx.   COPD (chronic obstructive pulmonary disease) (HCC)    Diabetes mellitus without complication (HCC)    Dyspnea    GERD (gastroesophageal reflux disease)    History of hiatal hernia    History of orthopnea    History of radiation therapy 07/29/20-08/08/20   Right Lung, SBRT Dr. JamGery PrayHypothyroidism    Moderate aortic stenosis    a. 03/2020  Echo: Mod-sev AS. AVA 1.11cm^2. Mean grad 23m76m b. 10/2020 Echo: EF 55-60%, no rwma, nl RV fxn, RVSP 30mm12mmild BAE, mod-sev mitral annular Ca2+ w/o MS/MR. Mod-sev AS. AVA 0.91cm^2 (VTI), mean grad 29mmH56m Neuropathy    Oxygen deficiency    3L/HS   PAF (paroxysmal atrial fibrillation) (HCC)  [place]. Post-termination pauses. Prev on amio.  CHA2DS2VASc 6-->eliqis; b. 01/2021 Zio: Predominantly RSR w/ avg HR 81 (41-194). 2 runs NSVT vs AF w/ aberrancy (longest 12 beats @ 185). 531 SVT runs (longest 11 beats; fastest 194). 10% Afib burden (80-185 bpm, avg 118). Longest Afib 58m49s38m pauses - longest 4.1secs - jxnl rhythm present. Isolated PACs (1.6%).   Pain    BACK/DDD   Peripheral vascular disease (HCC)    RLS (restless legs syndrome)    Sleep apnea    Wheezing     Past Surgical History:  Procedure Laterality Date   BREAST SURGERY     CATARACT EXTRACTION W/PHACO Right 03/02/2018   Procedure: CATARACT EXTRACTION PHACO AND INTRAOCULAR LENS PLACEMENT (IOC);  [place]: Harrow,Marchia MeiersLocation[facility];  Service: Ophthalmology;  Laterality: Right;  US 01:1[place]DE 16.46 Fluid pack lot # [id number] EXTRACTION W/PHACO Left 04/27/2018   Procedure: CATARACT EXTRACTION PHACO AND INTRAOCULAR LENS PLACEMENT ([facility])- [place] [facility];  Surgeon: Harrow,Marchia MeiersLocation: [facility];  Service: Ophthalmology;  Laterality:  Left;  Lot # [id number] H [place]: 00:46.3 CDE: 8.07    CYST EXCISION     FOREHEAD   FOOT SURGERY     CYST   RIGHT/LEFT HEART CATH AND CORONARY ANGIOGRAPHY N/A 03/31/2020   Procedure: RIGHT/LEFT HEART CATH AND CORONARY ANGIOGRAPHY;  Surgeon: Wellington Hampshire, MD;  Location: [facility];  Service: Cardiovascular;  Laterality: N/A;   TUBAL LIGATION      Current Medications: Current Meds  Medication Sig   albuterol (PROVENTIL) (2.5 MG/3ML) 0.083% nebulizer solution Take 3 mLs (2.5 mg total) by nebulization every 4 (four) hours as needed for wheezing or shortness of  breath.   albuterol (VENTOLIN HFA) 108 (90 Base) MCG/ACT inhaler Inhale 2 puffs into the lungs every 4 (four) hours as needed for wheezing or shortness of breath.    amiodarone (PACERONE) 200 MG tablet Take 200 mg by mouth daily.   apixaban (ELIQUIS) 5 MG TABS tablet Take 1 tablet (5 mg total) by mouth 2 (two) times daily.   atorvastatin (LIPITOR) 80 MG tablet Take 1 tablet (80 mg total) by mouth at bedtime.   citalopram (CELEXA) 40 MG tablet Take 40 mg by mouth daily.   diltiazem (CARDIZEM) 30 MG tablet Take 1 tablet (30 mg total) by mouth 3 (three) times daily as needed. For A-fib breakthrough   docusate sodium (COLACE) 100 MG capsule Take 100 mg by mouth at bedtime as needed for mild constipation or moderate constipation.   ezetimibe (ZETIA) 10 MG tablet Take 10 mg by mouth daily.   famotidine (PEPCID) 40 MG tablet Take 40 mg by mouth at bedtime.   fenofibrate 160 MG tablet Take 160 mg by mouth daily.   ferrous sulfate 325 (65 FE) MG tablet Take 325 mg by mouth daily.   gabapentin (NEURONTIN) 600 MG tablet Take 600 mg by mouth 2 (two) times daily.    hydrOXYzine (VISTARIL) 50 MG capsule Take 50 mg by mouth every 6 (six) hours as needed for anxiety.   irbesartan (AVAPRO) 75 MG tablet Take 1 tablet (75 mg total) by mouth daily.   LAGEVRIO 200 MG CAPS capsule SMARTSIG:4 Capsule(s) By Mouth Every 12 Hours   levothyroxine (SYNTHROID) 75 MCG tablet Take 75 mcg by mouth daily before breakfast.   lidocaine (LIDODERM) 5 % Place 3 patches onto the skin daily. (Remove after 12 hours and keep off for 12 hours)   Melatonin 10 MG TABS Take 10 mg by mouth at bedtime as needed (sleep).   metFORMIN (GLUCOPHAGE) 500 MG tablet Take 500 mg by mouth 2 (two) times daily.   nicotine (NICODERM CQ - DOSED IN MG/24 HOURS) 21 mg/24hr patch Place 1 patch (21 mg total) onto the skin daily.   nystatin (MYCOSTATIN/NYSTOP) powder Apply topically 3 (three) times daily.   oxyCODONE-acetaminophen (PERCOCET) 10-325 MG tablet  Take 1 tablet by mouth 5 (five) times daily as needed for pain.   pantoprazole (PROTONIX) 40 MG tablet Take 40 mg by mouth 2 (two) times daily.   sitaGLIPtin (JANUVIA) 100 MG tablet Take 100 mg by mouth daily.   Tiotropium Bromide-Olodaterol (STIOLTO RESPIMAT) 2.5-2.5 MCG/ACT AERS Inhale 2 puffs into the lungs daily.   torsemide (DEMADEX) 20 MG tablet Take 1 tablet (20 mg total) by mouth daily. Alternating with 20 mg by mouth every other day   Vitamin D, Ergocalciferol, (DRISDOL) 1.25 MG (50000 UNIT) CAPS capsule Take 50,000 Units by mouth once a week. Mondays     Allergies:   Altace [ramipril]   Social History  Socioeconomic History   Marital status: Widowed    Spouse name: Not on file   Number of children: 4   Years of education: Not on file   Highest education level: Not on file  Occupational History   Occupation: Health and safety inspector  Tobacco Use   Smoking status: Every Day    Packs/day: 1.00    Years: 50.00    Pack years: 50.00    Types: Cigarettes   Smokeless tobacco: Never   Tobacco comments:    4-5 cigs per day 01/23/2021  Vaping Use   Vaping Use: Never used  Substance and Sexual Activity   Alcohol use: Not Currently   Drug use: Never   Sexual activity: Never    Birth control/protection: None  Other Topics Concern   Not on file  Social History Narrative   [place]; [name]- smokes 4-5 cig/day; no alcohol. Used to work in [employer]. 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    Social Determinants of Health   Financial Resource Strain: Not on file  Food Insecurity: Not on file  Transportation Needs: Not on file  Physical Activity: Not on file  Stress: Not on file  Social Connections: Not on file     Family History: The patient's family history includes Cancer in her father; Heart disease in her mother.  ROS:   Please see the history of present illness.    All other systems reviewed and are negative.  EKGs/Labs/Other Studies Reviewed:    The following  studies were reviewed today:  February 03, 2021 ZIO personally reviewed 41-1 94, average 81 bpm 2 nonsustained VT, possible A. fib with aberrancy 10% burden of A. fib/flutter with average rate of 118 bpm.  Longest episode lasting 58 minutes. 19 pauses, longest 4.1 seconds Junctional rhythm was present Occasional supraventricular ectopy, occasional ventricular ectopy, 2.3%  October 24, 2020 echo LV function normal, 55% Moderate LVH Dilated left and right atria Moderate to severe MAC Moderate to severe aortic stenosis  EKG:  The ekg ordered today demonstrates sinus bradycardia with a ventricular rate of 53 bpm.  Low amplitude QRS   Recent Labs: 01/17/2021: TSH 0.802 01/18/2021: B Natriuretic Peptide 986.7 01/20/2021: Magnesium 2.0 02/20/2021: ALT 12; BUN 32; Creatinine, Ser 1.74; Hemoglobin 9.3; Platelets 224; Potassium 4.5; Sodium 137  Recent Lipid Panel    Component Value Date/Time   CHOL 165 06/27/2020 0909   TRIG 64 06/27/2020 0909   HDL 61 06/27/2020 0909   CHOLHDL 2.7 06/27/2020 0909   VLDL 13 06/27/2020 0909   LDLCALC 91 06/27/2020 0909    Physical Exam:    VS:  BP (!) 110/40 (BP Location: Right Arm, Patient Position: Sitting, Cuff Size: Large)   Pulse (!) 53   Ht _0  (1.575 m)   Wt 234 lb (106.1 kg)   LMP  (LMP Unknown)   SpO2 95% Comment: 3 Liters of Oxygen  BMI 42.80 kg/m     Wt Readings from Last 3 Encounters:  03/18/21 234 lb (106.1 kg)  03/09/21 234 lb (106.1 kg)  02/20/21 237 lb (107.5 kg)     GEN:  Well nourished, well developed in no acute distress.  Morbidly obese.  In wheelchair.  On supplemental oxygen. HEENT: Normal NECK: No JVD; No carotid bruits LYMPHATICS: No lymphadenopathy CARDIAC: RRR, 3 out of 6 crescendo decrescendo systolic murmur at the upper sternal borders. RESPIRATORY:  Clear to auscultation without rales, wheezing or rhonchi  ABDOMEN: Soft, non-tender, non-distended MUSCULOSKELETAL:  No edema; No deformity  SKIN: Warm and  dry NEUROLOGIC:  Alert and oriented x 3 PSYCHIATRIC:  Normal affect       ASSESSMENT:    1. Chronic heart failure with preserved ejection fraction (HFpEF) ([place])   2. PAF (paroxysmal atrial fibrillation) ([place])   3. Coronary artery disease involving native coronary artery of native heart without angina pectoris   4. Atrial flutter with rapid ventricular response ([place])   5. Severe aortic stenosis    PLAN:    In order of problems listed above:  #Chronic diastolic heart failure Warm and relatively euvolemic on exam.  Continue current medical regimen.  #Paroxysmal atrial fibrillation On Eliquis for stroke prophylaxis.  Given her multiple medical comorbidities, I would continue with a conservative medical approach at this time.  She is not a candidate for catheter ablation.  #Coronary artery disease No ischemic symptoms today  #Moderate to severe aortic stenosis Followed by the structural clinic.  Poor candidate for invasive procedures.  Follow-up with EP as needed.     Medication Adjustments/Labs and Tests Ordered: Current medicines are reviewed at length with the patient today.  Concerns regarding medicines are outlined above.  No orders of the defined types were placed in this encounter.  No orders of the defined types were placed in this encounter.    Signed, Hilton Cork. Quentin Ore, MD, [facility], [facility] 03/18/2021 10:10 AM    Electrophysiology  [facility]

## 2021-03-18 ENCOUNTER — Ambulatory Visit (INDEPENDENT_AMBULATORY_CARE_PROVIDER_SITE_OTHER): Payer: Medicare Other | Admitting: Cardiology

## 2021-03-18 ENCOUNTER — Encounter: Payer: Self-pay | Admitting: Cardiology

## 2021-03-18 ENCOUNTER — Other Ambulatory Visit: Payer: Self-pay

## 2021-03-18 VITALS — BP 110/40 | HR 53 | Ht 62.0 in | Wt 234.0 lb

## 2021-03-18 DIAGNOSIS — I2510 Atherosclerotic heart disease of native coronary artery without angina pectoris: Secondary | ICD-10-CM

## 2021-03-18 DIAGNOSIS — I5032 Chronic diastolic (congestive) heart failure: Secondary | ICD-10-CM | POA: Diagnosis not present

## 2021-03-18 DIAGNOSIS — I480 Paroxysmal atrial fibrillation: Secondary | ICD-10-CM | POA: Diagnosis not present

## 2021-03-18 DIAGNOSIS — I4892 Unspecified atrial flutter: Secondary | ICD-10-CM | POA: Diagnosis not present

## 2021-03-18 DIAGNOSIS — I350 Nonrheumatic aortic (valve) stenosis: Secondary | ICD-10-CM | POA: Diagnosis not present

## 2021-03-18 NOTE — Patient Instructions (Addendum)

## 2021-03-23 ENCOUNTER — Other Ambulatory Visit: Payer: Medicare Other

## 2021-03-23 ENCOUNTER — Ambulatory Visit: Payer: Medicare Other

## 2021-03-23 ENCOUNTER — Ambulatory Visit: Payer: Medicare Other | Admitting: Internal Medicine

## 2021-03-23 NOTE — Progress Notes (Signed)
Pharmacist Chemotherapy Monitoring - Initial Assessment    Anticipated start date: 03/30/21   The following has been reviewed per standard work regarding the patient's treatment regimen: The patient's diagnosis, treatment plan and drug doses, and organ/hematologic function Lab orders and baseline tests specific to treatment regimen  The treatment plan start date, drug sequencing, and pre-medications Prior authorization status  Patient's documented medication list, including drug-drug interaction screen and prescriptions for anti-emetics and supportive care specific to the treatment regimen The drug concentrations, fluid compatibility, administration routes, and timing of the medications to be used The patient's access for treatment and lifetime cumulative dose history, if applicable  The patient's medication allergies and previous infusion related reactions, if applicable   Changes made to treatment plan:  treatment plan date  Follow up needed:  signing treatment plan   Adelina Mings, [place], 03/23/2021  1:49 PM

## 2021-03-25 ENCOUNTER — Other Ambulatory Visit: Payer: Self-pay | Admitting: *Deleted

## 2021-03-25 DIAGNOSIS — C3431 Malignant neoplasm of lower lobe, right bronchus or lung: Secondary | ICD-10-CM

## 2021-03-30 ENCOUNTER — Inpatient Hospital Stay: Payer: Medicare Other

## 2021-03-30 ENCOUNTER — Encounter: Payer: Self-pay | Admitting: *Deleted

## 2021-03-30 ENCOUNTER — Inpatient Hospital Stay: Payer: Medicare Other | Attending: Internal Medicine

## 2021-03-30 ENCOUNTER — Encounter: Payer: Self-pay | Admitting: Internal Medicine

## 2021-03-30 ENCOUNTER — Inpatient Hospital Stay (HOSPITAL_BASED_OUTPATIENT_CLINIC_OR_DEPARTMENT_OTHER): Payer: Medicare Other | Admitting: Internal Medicine

## 2021-03-30 ENCOUNTER — Other Ambulatory Visit: Payer: Self-pay

## 2021-03-30 DIAGNOSIS — R0602 Shortness of breath: Secondary | ICD-10-CM | POA: Diagnosis not present

## 2021-03-30 DIAGNOSIS — M2550 Pain in unspecified joint: Secondary | ICD-10-CM | POA: Diagnosis not present

## 2021-03-30 DIAGNOSIS — M5489 Other dorsalgia: Secondary | ICD-10-CM | POA: Insufficient documentation

## 2021-03-30 DIAGNOSIS — U071 COVID-19: Secondary | ICD-10-CM | POA: Diagnosis not present

## 2021-03-30 DIAGNOSIS — J449 Chronic obstructive pulmonary disease, unspecified: Secondary | ICD-10-CM | POA: Insufficient documentation

## 2021-03-30 DIAGNOSIS — Z809 Family history of malignant neoplasm, unspecified: Secondary | ICD-10-CM | POA: Diagnosis not present

## 2021-03-30 DIAGNOSIS — Z9981 Dependence on supplemental oxygen: Secondary | ICD-10-CM | POA: Insufficient documentation

## 2021-03-30 DIAGNOSIS — F17210 Nicotine dependence, cigarettes, uncomplicated: Secondary | ICD-10-CM | POA: Diagnosis not present

## 2021-03-30 DIAGNOSIS — R059 Cough, unspecified: Secondary | ICD-10-CM | POA: Diagnosis not present

## 2021-03-30 DIAGNOSIS — Z923 Personal history of irradiation: Secondary | ICD-10-CM | POA: Insufficient documentation

## 2021-03-30 DIAGNOSIS — I2510 Atherosclerotic heart disease of native coronary artery without angina pectoris: Secondary | ICD-10-CM | POA: Insufficient documentation

## 2021-03-30 DIAGNOSIS — Z5112 Encounter for antineoplastic immunotherapy: Secondary | ICD-10-CM | POA: Insufficient documentation

## 2021-03-30 DIAGNOSIS — I5032 Chronic diastolic (congestive) heart failure: Secondary | ICD-10-CM | POA: Insufficient documentation

## 2021-03-30 DIAGNOSIS — R5383 Other fatigue: Secondary | ICD-10-CM | POA: Diagnosis not present

## 2021-03-30 DIAGNOSIS — N183 Chronic kidney disease, stage 3 unspecified: Secondary | ICD-10-CM | POA: Insufficient documentation

## 2021-03-30 DIAGNOSIS — Z8249 Family history of ischemic heart disease and other diseases of the circulatory system: Secondary | ICD-10-CM | POA: Insufficient documentation

## 2021-03-30 DIAGNOSIS — E1122 Type 2 diabetes mellitus with diabetic chronic kidney disease: Secondary | ICD-10-CM | POA: Diagnosis not present

## 2021-03-30 DIAGNOSIS — I480 Paroxysmal atrial fibrillation: Secondary | ICD-10-CM | POA: Insufficient documentation

## 2021-03-30 DIAGNOSIS — C3431 Malignant neoplasm of lower lobe, right bronchus or lung: Secondary | ICD-10-CM | POA: Diagnosis present

## 2021-03-30 DIAGNOSIS — Z7901 Long term (current) use of anticoagulants: Secondary | ICD-10-CM | POA: Diagnosis not present

## 2021-03-30 DIAGNOSIS — C3490 Malignant neoplasm of unspecified part of unspecified bronchus or lung: Secondary | ICD-10-CM

## 2021-03-30 DIAGNOSIS — E039 Hypothyroidism, unspecified: Secondary | ICD-10-CM | POA: Insufficient documentation

## 2021-03-30 DIAGNOSIS — Z79899 Other long term (current) drug therapy: Secondary | ICD-10-CM | POA: Diagnosis not present

## 2021-03-30 DIAGNOSIS — I129 Hypertensive chronic kidney disease with stage 1 through stage 4 chronic kidney disease, or unspecified chronic kidney disease: Secondary | ICD-10-CM | POA: Diagnosis not present

## 2021-03-30 DIAGNOSIS — G4730 Sleep apnea, unspecified: Secondary | ICD-10-CM | POA: Diagnosis not present

## 2021-03-30 LAB — COMPREHENSIVE METABOLIC PANEL
ALT: 10 U/L (ref 0–44)
AST: 16 U/L (ref 15–41)
Albumin: 3.4 g/dL — ABNORMAL LOW (ref 3.5–5.0)
Alkaline Phosphatase: 37 U/L — ABNORMAL LOW (ref 38–126)
Anion gap: 7 (ref 5–15)
BUN: 28 mg/dL — ABNORMAL HIGH (ref 8–23)
CO2: 38 mmol/L — ABNORMAL HIGH (ref 22–32)
Calcium: 8.9 mg/dL (ref 8.9–10.3)
Chloride: 95 mmol/L — ABNORMAL LOW (ref 98–111)
Creatinine, Ser: 1.62 mg/dL — ABNORMAL HIGH (ref 0.44–1.00)
GFR, Estimated: 34 mL/min — ABNORMAL LOW (ref >60)
Glucose, Bld: 113 mg/dL — ABNORMAL HIGH (ref 70–99)
Potassium: 4.3 mmol/L (ref 3.5–5.1)
Sodium: 140 mmol/L (ref 135–145)
Total Bilirubin: 0.3 mg/dL (ref 0.3–1.2)
Total Protein: 7.3 g/dL (ref 6.5–8.1)

## 2021-03-30 LAB — CBC WITH DIFFERENTIAL/PLATELET
Abs Immature Granulocytes: 0.02 10*3/uL (ref 0.00–0.07)
Basophils Absolute: 0.1 10*3/uL (ref 0.0–0.1)
Basophils Relative: 1 %
Eosinophils Absolute: 0.5 10*3/uL (ref 0.0–0.5)
Eosinophils Relative: 7 %
HCT: 32 % — ABNORMAL LOW (ref 36.0–46.0)
Hemoglobin: 9.8 g/dL — ABNORMAL LOW (ref 12.0–15.0)
Immature Granulocytes: 0 %
Lymphocytes Relative: 11 %
Lymphs Abs: 0.8 10*3/uL (ref 0.7–4.0)
MCH: 28 pg (ref 26.0–34.0)
MCHC: 30.6 g/dL (ref 30.0–36.0)
MCV: 91.4 fL (ref 80.0–100.0)
Monocytes Absolute: 0.6 10*3/uL (ref 0.1–1.0)
Monocytes Relative: 8 %
Neutro Abs: 5.3 10*3/uL (ref 1.7–7.7)
Neutrophils Relative %: 73 %
Platelets: 198 10*3/uL (ref 150–400)
RBC: 3.5 MIL/uL — ABNORMAL LOW (ref 3.87–5.11)
RDW: 14.9 % (ref 11.5–15.5)
WBC: 7.2 10*3/uL (ref 4.0–10.5)
nRBC: 0 % (ref 0.0–0.2)

## 2021-03-30 LAB — TSH: TSH: 1.978 u[IU]/mL (ref 0.350–4.500)

## 2021-03-30 MED ORDER — SODIUM CHLORIDE 0.9 % IV SOLN
200.0000 mg | Freq: Once | INTRAVENOUS | Status: AC
  Administered 2021-03-30: 200 mg via INTRAVENOUS
  Filled 2021-03-30: qty 8

## 2021-03-30 MED ORDER — SODIUM CHLORIDE 0.9 % IV SOLN
Freq: Once | INTRAVENOUS | Status: AC
  Filled 2021-03-30: qty 250

## 2021-03-30 NOTE — Assessment & Plan Note (Addendum)
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  PTS =1%. OCT 2022- MRI-NEG.  # Proceed with [place]. Labs today reviewed;  acceptable for treatment today.  I discussed the mechanism of action; The goal of therapy is palliative; and length of treatments are likely ongoing/based upon the results of the scans.   Discussed the potential side effects of immunotherapy including but not limited to diarrhea; skin rash; elevated LFTs/endocrine abnormalities etc.  # Smoking: Active smoker/not interested in smoking cessation.  # COPD-on 2 L of oxygen/CHF/A. fib on Eliquis-stable.  #Poorly controlled diabetes/peripheral neuropathy.; CKD-Stage III- STABLE.  #Poor IV access: Recommend port placement to make a referral.  # DISPOSITION: # Kristin [name] today # refer to IR for port placement.  # follow up in 3 week  MD; labs- cbc/cmp-KeytrdaDr.B

## 2021-03-30 NOTE — Progress Notes (Signed)
Met with patient and her daughter during follow up visit today to start [place] treatments. All questions answered during visit. Informed that will be given follow up appts prior to leaving today. Instructed to call with any questions or needs. Will assist in scheduling for port placement prior to second treatment. Pt's daughter made aware that she will be called with that appt once scheduled. Pt and daughter verbalized understanding. Nothing further needed at this time.

## 2021-03-30 NOTE — Progress Notes (Signed)
Pt scheduled for port placement on 12/2 at 10am with 9am arrival. Message left with pt's daughter, [name], with appt info and to expect call from IR nurse 1-2 days before procedure. Instructed to call back with any questions or needs.

## 2021-03-30 NOTE — Progress Notes (Signed)
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  COPD 3 lits/O2; active smoker- 4-5 cig/day; CAD; A.fib- on eliquis [Dr.Gollan-[place]]   Primary cancer of right lower lobe of lung ([place])  02/20/2021 Initial Diagnosis   Primary cancer of right lower lobe of lung ([place])   03/30/2021 -  Chemotherapy   Patient is on Treatment Plan : LUNG NSCLC flat dose Pembrolizumab Q21D        HISTORY OF PRESENTING ILLNESS: Currently in the wheelchair- 2-3 lits of O2 at home.  Uses a cane or walker at home.  Accompanied by daughter.  Kristin [name] 70 y.o.  female history of smoking/chronic respiratory failure on home O2 ; with recurrent lung cancer right hilar/mediastinal adenopathy is here for follow-up.  Patient's treatment has been delayed because of recent diagnosis of COVID.  Patient continues to have chronic shortness of breath chronic cough.  No hemoptysis.  No nausea no vomiting.  Review of Systems  Constitutional:  Positive for malaise/fatigue. Negative for chills, diaphoresis, fever and weight loss.  HENT:  Negative for nosebleeds and sore throat.   Eyes:  Negative for double vision.  Respiratory:  Positive for cough, sputum production and shortness of breath. Negative for hemoptysis and wheezing.   Cardiovascular:  Negative for chest pain, palpitations, orthopnea and leg swelling.  Gastrointestinal:  Negative  for abdominal pain, blood in stool, constipation, diarrhea, heartburn, melena and vomiting.  Genitourinary:  Negative for dysuria, frequency and urgency.  Musculoskeletal:  Positive for back pain and joint pain.  Skin: Negative.  Negative for itching and rash.  Neurological:  Negative for focal weakness and weakness.  Endo/Heme/Allergies:  Does not bruise/bleed easily.  Psychiatric/Behavioral:  Negative for depression. The patient is not nervous/anxious and does not have insomnia.     MEDICAL HISTORY:  Past Medical History:  Diagnosis Date   (HFpEF) heart failure with preserved ejection fraction ([place])    a. 09/2020 Echo: EF >55%; b. 10/2020 Echo: EF 55-60%. RVSP 77mmHg.   Anxiety    Asthma    CAD (coronary artery disease)    a. 03/2020 Cath: LM nl, LAD 20, LCX 60p, 86m/d, RCA 30p, 59m, RPDA 30-->Med Rx.   COPD (chronic obstructive pulmonary disease) (HCC)    Diabetes mellitus without complication (HCC)    Dyspnea    GERD (gastroesophageal reflux disease)    History of hiatal hernia    History of orthopnea    History of radiation therapy 07/29/20-08/08/20   Right Lung, SBRT Dr. Gery [name]   Hypothyroidism    Moderate aortic stenosis    a. 03/2020 Echo: Mod-sev AS. AVA 1.11cm^2. Mean grad 36mmHg; b. 10/2020 Echo: EF 55-60%, no rwma, nl RV fxn, RVSP 86mmHg, mild BAE, mod-sev mitral annular Ca2+ w/o MS/MR. Mod-sev AS. AVA 0.91cm^2 (VTI), mean grad 28mmHg.   Neuropathy    Oxygen deficiency    3L/HS   PAF (paroxysmal atrial fibrillation) ([name])    a. Post-termination pauses. Prev on amio.  CHA2DS2VASc 6-->eliqis; b. 01/2021 Zio: Predominantly RSR  w/ avg HR 81 (41-194). 2 runs NSVT vs AF w/ aberrancy (longest 12 beats @ 185). 531 SVT runs (longest 11 beats; fastest 194). 10% Afib burden (80-185 bpm, avg 118). Longest Afib 25m49s. 19 pauses - longest 4.1secs - jxnl rhythm present. Isolated PACs (1.6%).   Pain    BACK/DDD   Peripheral vascular disease (HCC)    RLS (restless legs syndrome)     Sleep apnea    Wheezing     SURGICAL HISTORY: Past Surgical History:  Procedure Laterality Date   BREAST SURGERY     CATARACT EXTRACTION W/PHACO Right 03/02/2018   Procedure: CATARACT EXTRACTION PHACO AND INTRAOCULAR LENS PLACEMENT (Kristin [name]);  Surgeon: Kristin Meiers, MD;  Location: [name];  Service: Ophthalmology;  Laterality: Right;  [place] 01:15 CDE 16.46 Fluid pack lot # [id number]   CATARACT EXTRACTION W/PHACO Left 04/27/2018   Procedure: CATARACT EXTRACTION PHACO AND INTRAOCULAR LENS PLACEMENT (Trainer)- LEFT DIABETIC;  Surgeon: Kristin Meiers, MD;  Location: [name];  Service: Ophthalmology;  Laterality: Left;  Lot # [id number] [place]: 00:46.3 CDE: 8.07    CYST EXCISION     FOREHEAD   FOOT SURGERY     CYST   RIGHT/LEFT HEART CATH AND CORONARY ANGIOGRAPHY N/A 03/31/2020   Procedure: RIGHT/LEFT HEART CATH AND CORONARY ANGIOGRAPHY;  Surgeon: Kristin Hampshire, MD;  Location: [name];  Service: Cardiovascular;  Laterality: N/A;   TUBAL LIGATION      SOCIAL HISTORY: Social History   Socioeconomic History   Marital status: Widowed    Spouse name: Not on file   Number of children: 4   Years of education: Not on file   Highest education level: Not on file  Occupational History   Occupation: Health and safety inspector  Tobacco Use   Smoking status: Every Day    Packs/day: 1.00    Years: 50.00    Pack years: 50.00    Types: Cigarettes   Smokeless tobacco: Never   Tobacco comments:    4-5 cigs per day 01/23/2021  Vaping Use   Vaping Use: Never used  Substance and Sexual Activity   Alcohol use: Not Currently   Drug use: Never   Sexual activity: Never    Birth control/protection: None  Other Topics Concern   Not on file  Social History Narrative   [place]; Kristin [name]- smokes 4-5 cig/day; no alcohol. Used to work in [employer]. 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    Social Determinants of Health   Financial Resource Strain: Not on file  Food Insecurity: Not on file   Transportation Needs: Not on file  Physical Activity: Not on file  Stress: Not on file  Social Connections: Not on file  Intimate Partner Violence: Not on file    FAMILY HISTORY: Family History  Problem Relation Age of Onset   Heart disease Mother    Cancer Father     ALLERGIES:  is allergic to altace [ramipril].  MEDICATIONS:  Current Outpatient Medications  Medication Sig Dispense Refill   albuterol (PROVENTIL) (2.5 MG/3ML) 0.083% nebulizer solution Take 3 mLs (2.5 mg total) by nebulization every 4 (four) hours as needed for wheezing or shortness of breath. 75 mL 12   albuterol (VENTOLIN HFA) 108 (90 Base) MCG/ACT inhaler Inhale 2 puffs into the lungs every 4 (four) hours as needed for wheezing or shortness of breath.      amiodarone (PACERONE) 200 MG tablet Take 200 mg by mouth daily.     apixaban (ELIQUIS) 5 MG TABS tablet  Take 1 tablet (5 mg total) by mouth 2 (two) times daily. 60 tablet 11   atorvastatin (LIPITOR) 80 MG tablet Take 1 tablet (80 mg total) by mouth at bedtime. 90 tablet 3   citalopram (CELEXA) 40 MG tablet Take 40 mg by mouth daily.     diltiazem (CARDIZEM) 30 MG tablet Take 1 tablet (30 mg total) by mouth 3 (three) times daily as needed. For A-fib breakthrough 90 tablet 3   docusate sodium (COLACE) 100 MG capsule Take 100 mg by mouth at bedtime as needed for mild constipation or moderate constipation.     ezetimibe (ZETIA) 10 MG tablet Take 10 mg by mouth daily.     famotidine (PEPCID) 40 MG tablet Take 40 mg by mouth at bedtime.     fenofibrate 160 MG tablet Take 160 mg by mouth daily.     ferrous sulfate 325 (65 FE) MG tablet Take 325 mg by mouth daily.     gabapentin (NEURONTIN) 600 MG tablet Take 600 mg by mouth 2 (two) times daily.      hydrOXYzine (VISTARIL) 50 MG capsule Take 50 mg by mouth every 6 (six) hours as needed for anxiety.     irbesartan (AVAPRO) 75 MG tablet Take 1 tablet (75 mg total) by mouth daily. 90 tablet 3   levothyroxine (SYNTHROID)  75 MCG tablet Take 75 mcg by mouth daily before breakfast.     lidocaine (LIDODERM) 5 % Place 3 patches onto the skin daily. (Remove after 12 hours and keep off for 12 hours)     Melatonin 10 MG TABS Take 10 mg by mouth at bedtime as needed (sleep).     nicotine (NICODERM CQ - DOSED IN MG/24 HOURS) 21 mg/24hr patch Place 1 patch (21 mg total) onto the skin daily. 28 patch 5   nystatin (MYCOSTATIN/NYSTOP) powder Apply topically 3 (three) times daily. 15 g 0   oxyCODONE-acetaminophen (PERCOCET) 10-325 MG tablet Take 1 tablet by mouth 5 (five) times daily as needed for pain.     pantoprazole (PROTONIX) 40 MG tablet Take 40 mg by mouth 2 (two) times daily.     sitaGLIPtin (JANUVIA) 100 MG tablet Take 100 mg by mouth daily.     Tiotropium Bromide-Olodaterol (STIOLTO RESPIMAT) 2.5-2.5 MCG/ACT AERS Inhale 2 puffs into the lungs daily. 1 each 11   torsemide (DEMADEX) 20 MG tablet Take 1 tablet (20 mg total) by mouth daily. Alternating with 20 mg by mouth every other day 90 tablet 3   Vitamin D, Ergocalciferol, (DRISDOL) 1.25 MG (50000 UNIT) CAPS capsule Take 50,000 Units by mouth once a week. Mondays     LAGEVRIO 200 MG CAPS capsule SMARTSIG:4 Capsule(s) By Mouth Every 12 Hours     metFORMIN (GLUCOPHAGE) 500 MG tablet Take 500 mg by mouth 2 (two) times daily. (Patient not taking: Reported on 03/30/2021)     No current facility-administered medications for this visit.      Marland Kitchen  PHYSICAL EXAMINATION: ECOG PERFORMANCE STATUS: 2 - Symptomatic, <50% confined to bed  Vitals:   03/30/21 1013  BP: 127/78  Pulse: 72  Resp: 18  Temp: 98.7 F (37.1 C)  SpO2: 93%   Filed Weights   03/30/21 1013  Weight: 233 lb (105.7 kg)    Physical Exam Vitals and nursing note reviewed.  HENT:     Head: Normocephalic and atraumatic.     Mouth/Throat:     Pharynx: Oropharynx is clear.  Eyes:     Extraocular Movements: Extraocular movements intact.  Pupils: Pupils are equal, round, and reactive to light.   Cardiovascular:     Rate and Rhythm: Normal rate and regular rhythm.  Pulmonary:     Comments: Decreased breath sounds bilaterally.  Abdominal:     Palpations: Abdomen is soft.  Musculoskeletal:        General: Normal range of motion.     Cervical back: Normal range of motion.  Skin:    General: Skin is warm.  Neurological:     General: No focal deficit present.     Mental Status: She is alert and oriented to person, place, and time.  Psychiatric:        Behavior: Behavior normal.        Judgment: Judgment normal.     LABORATORY DATA:  I have reviewed the data as listed [name] Results  Component Value Date   WBC 7.2 03/30/2021   HGB 9.8 (L) 03/30/2021   HCT 32.0 (L) 03/30/2021   MCV 91.4 03/30/2021   PLT 198 03/30/2021   Recent Labs    06/17/20 1238 06/27/20 0909 07/04/20 1242 01/19/21 0529 01/20/21 0548 02/20/21 1506 03/30/21 0943  NA 143 142   < > 139 138 137 140  K 3.6 4.3   < > 4.4 4.4 4.5 4.3  CL 100 96*   < > 97* 94* 96* 95*  CO2 31 33*   < > 34* 35* 35* 38*  GLUCOSE 189* 125*   < > 160* 164* 114* 113*  BUN 18 34*   < > 42* 53* 32* 28*  CREATININE 1.16* 1.76*   < > 1.67* 1.68* 1.74* 1.62*  CALCIUM 8.8* 8.6*   < > 9.0 8.8* 8.6* 8.9  GFRNONAA 51* 31*   < > 33* 33* 32* 34*  PROT 6.6 6.6   < > 6.6  --  6.7 7.3  ALBUMIN 3.6 3.6   < > 3.4*  --  3.3* 3.4*  AST 26 18   < > 15  --  17 16  ALT 20 18   < > 14  --  12 10  ALKPHOS 33* 28*   < > 32*  --  34* 37*  BILITOT 0.5 0.3   < > 0.6  --  0.4 0.3  BILIDIR <0.1 <0.1  --   --   --   --   --   IBILI NOT CALCULATED NOT CALCULATED  --   --   --   --   --    < > = values in this interval not displayed.    RADIOGRAPHIC STUDIES: I have personally reviewed the radiological images as listed and agreed with the findings in the report. No results found.  ASSESSMENT & PLAN:   Primary cancer of right lower lobe of lung ([place]) #Squamous cell carcinoma right lower lobe [December 2021]; status post SBRT -CT scan PET scan  September 2022  imaging findings highly concerning for recurrent malignancy-hold off biopsy given patient's tenuous respiratory status.  PTS =1%. OCT 2022- MRI-NEG.  # Proceed with [place]. Labs today reviewed;  acceptable for treatment today.    # I discussed the mechanism of action; The goal of therapy is palliative; and length of treatments are likely ongoing/based upon the results of the scans.  Recommend Keytruda IV infusion every 3 weeks.  Discussed the potential side effects of immunotherapy including but not limited to diarrhea; skin rash; elevated LFTs/endocrine abnormalities etc.   # Smoking: Active smoker/not interested in smoking cessation.  # COPD-on  2 L of oxygen/CHF/A. fib on Eliquis-stable.  #Poorly controlled diabetes/peripheral neuropathy.; CKD-Stage III- STABLE.   # DISPOSITION: # keytruda today # refer to IR for port placement.  # follow up in 3 week  MD; labs- cbc/[email or web address]     All questions were answered. The patient knows to call the clinic with any problems, questions or concerns.       Cammie Sickle, MD 03/30/2021 12:02 PM

## 2021-03-30 NOTE — Progress Notes (Signed)
Patient here for oncology follow-up appointment, concerns of SOB and chest discomfort

## 2021-03-30 NOTE — Patient Instructions (Signed)
[facility] ONCOLOGY   Discharge Instructions: Thank you for choosing [facility] to provide your oncology and hematology care.  If you have a lab appointment with the [facility], please go directly to the [facility] and check in at the registration area.  Wear comfortable clothing and clothing appropriate for easy access to any Portacath or PICC line.   We strive to give you quality time with your provider. You may need to reschedule your appointment if you arrive late (15 or more minutes).  Arriving late affects you and other patients whose appointments are after yours.  Also, if you miss three or more appointments without notifying the office, you may be dismissed from the clinic at the provider's discretion.      For prescription refill requests, have your pharmacy contact our office and allow 72 hours for refills to be completed.    Today you received the following chemotherapy and/or immunotherapy agents: Keytruda.      To help prevent nausea and vomiting after your treatment, we encourage you to take your nausea medication as directed.  BELOW ARE SYMPTOMS THAT SHOULD BE REPORTED IMMEDIATELY: *FEVER GREATER THAN 100.4 F (38 C) OR HIGHER *CHILLS OR SWEATING *NAUSEA AND VOMITING THAT IS NOT CONTROLLED WITH YOUR NAUSEA MEDICATION *UNUSUAL SHORTNESS OF BREATH *UNUSUAL BRUISING OR BLEEDING *URINARY PROBLEMS (pain or burning when urinating, or frequent urination) *BOWEL PROBLEMS (unusual diarrhea, constipation, pain near the anus) TENDERNESS IN MOUTH AND THROAT WITH OR WITHOUT PRESENCE OF ULCERS (sore throat, sores in mouth, or a toothache) UNUSUAL RASH, SWELLING OR PAIN  UNUSUAL VAGINAL DISCHARGE OR ITCHING   Items with * indicate a potential emergency and should be followed up as soon as possible or go to the Emergency Department if any problems should occur.  Please show the CHEMOTHERAPY ALERT CARD or IMMUNOTHERAPY ALERT CARD at  check-in to the Emergency Department and triage nurse.  Should you have questions after your visit or need to cancel or reschedule your appointment, please contact [facility]  [phone] and follow the prompts.  Office hours are 8:00 a.m. to 4:30 p.m. Monday - Friday. Please note that voicemails left after 4:00 p.m. may not be returned until the following business day.  We are closed weekends and major holidays. You have access to a nurse at all times for urgent questions. Please call the main number to the clinic [phone] and follow the prompts.  For any non-urgent questions, you may also contact your provider using MyChart. We now offer e-Visits for anyone 104 and older to request care online for non-urgent symptoms. For details visit [email or web address].   Also download the MyChart app! Go to the app store, search "MyChart", open the app, select [facility], and log in with your MyChart username and password.  Due to Covid, a mask is required upon entering the hospital/clinic. If you do not have a mask, one will be given to you upon arrival. For doctor visits, patients may have 1 support person aged 81 or older with them. For treatment visits, patients cannot have anyone with them due to current Covid guidelines and our immunocompromised population.

## 2021-03-30 NOTE — Progress Notes (Signed)
Creatinine: 1.62. MD, Dr. Rogue Bussing, notified and aware. Per MD order: proceed with scheduled new Keytruda treatment today.

## 2021-03-31 ENCOUNTER — Telehealth: Payer: Self-pay

## 2021-03-31 NOTE — Telephone Encounter (Signed)
Telephone call to patient for follow up after receiving first infusion.   Patient states infusion went great.  States eating good and drinking plenty of fluids.   Denies any nausea or vomiting.  Encouraged patient to call for any concerns or questions. 

## 2021-04-14 NOTE — Progress Notes (Signed)
Patient on schedule for port placement 04/17/2021, called and spoke with patient and daughter on phone with pre procedure instructions given. Made aware to be here @ 0900, NPO after MN prior to procedure as well as driver post procedure/recovery/discharge. Stated understanding.

## 2021-04-16 ENCOUNTER — Other Ambulatory Visit: Payer: Self-pay

## 2021-04-16 ENCOUNTER — Ambulatory Visit (INDEPENDENT_AMBULATORY_CARE_PROVIDER_SITE_OTHER): Payer: Medicare Other

## 2021-04-16 DIAGNOSIS — I350 Nonrheumatic aortic (valve) stenosis: Secondary | ICD-10-CM

## 2021-04-16 LAB — ECHOCARDIOGRAM COMPLETE
AR max vel: 0.82 cm2
AV Area VTI: 0.83 cm2
AV Area mean vel: 0.78 cm2
AV Mean grad: 37 mmHg
AV Peak grad: 56.9 mmHg
Ao pk vel: 3.77 m/s
Area-P 1/2: 3.12 cm2
S' Lateral: 3.6 cm

## 2021-04-16 MED ORDER — PERFLUTREN LIPID MICROSPHERE
1.0000 mL | INTRAVENOUS | Status: AC | PRN
  Administered 2021-04-16: 2 mL via INTRAVENOUS

## 2021-04-16 NOTE — H&P (Incomplete)
Chief Complaint: Patient was seen in consultation today for port-a-catheter placement.   Referring Physician(s): Cammie Sickle  Supervising Physician: {Supervising Physician:[id number]}  Patient Status: [facility] - Out-pt  History of Present Illness: Kristin [name] is a 69 y.o. female with a medical history significant for HF, aortic stenosis, paroxysmal atrial fibrillation, PVD, COPD on home O2, DM and right lung cancer. 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 Current imaging shows findings highly concerning for recurrent malignancy and the Oncology team is preparing the patient for immunotherapy.   Interventional Radiology has been asked to evaluate this patient for an image-guided port-a-catheter placement to facilitate her treatment plans.   Past Medical History:  Diagnosis Date   (HFpEF) heart failure with preserved ejection fraction ([place])    a. 09/2020 Echo: EF >55%; b. 10/2020 Echo: EF 55-60%. RVSP 42mmHg.   Anxiety    Asthma    CAD (coronary artery disease)    a. 03/2020 Cath: LM nl, LAD 20, LCX 60p, 64m/d, RCA 30p, 25m, RPDA 30-->Med Rx.   COPD (chronic obstructive pulmonary disease) (HCC)    Diabetes mellitus without complication (HCC)    Dyspnea    GERD (gastroesophageal reflux disease)    History of hiatal hernia    History of orthopnea    History of radiation therapy 07/29/20-08/08/20   Right Lung, SBRT Dr. Gery Pray   Hypothyroidism    Moderate aortic stenosis    a. 03/2020 Echo: Mod-sev AS. AVA 1.11cm^2. Mean grad 28mmHg; b. 10/2020 Echo: EF 55-60%, no rwma, nl RV fxn, RVSP 13mmHg, mild BAE, mod-sev mitral annular Ca2+ w/o MS/MR. Mod-sev AS. AVA 0.91cm^2 (VTI), mean grad 64mmHg.   Neuropathy    Oxygen deficiency    3L/HS   PAF (paroxysmal atrial fibrillation) ([place])    a. Post-termination pauses. Prev on amio.  CHA2DS2VASc 6-->eliqis; b. 01/2021 Zio: Predominantly RSR w/ avg HR 81 (41-194). 2 runs NSVT  vs AF w/ aberrancy (longest 12 beats @ 185). 531 SVT runs (longest 11 beats; fastest 194). 10% Afib burden (80-185 bpm, avg 118). Longest Afib 28m49s. 19 pauses - longest 4.1secs - jxnl rhythm present. Isolated PACs (1.6%).   Pain    BACK/DDD   Peripheral vascular disease (HCC)    RLS (restless legs syndrome)    Sleep apnea    Wheezing     Past Surgical History:  Procedure Laterality Date   BREAST SURGERY     CATARACT EXTRACTION W/PHACO Right 03/02/2018   Procedure: CATARACT EXTRACTION PHACO AND INTRAOCULAR LENS PLACEMENT ([place]);  Surgeon: Marchia Meiers, MD;  Location: [facility] ORS;  Service: Ophthalmology;  Laterality: Right;  [place] 01:15 CDE 16.46 Fluid pack lot # [id number]   CATARACT EXTRACTION W/PHACO Left 04/27/2018   Procedure: CATARACT EXTRACTION PHACO AND INTRAOCULAR LENS PLACEMENT ([place])- LEFT DIABETIC;  Surgeon: Marchia Meiers, MD;  Location: [facility] ORS;  Service: Ophthalmology;  Laterality: Left;  Lot # [id number] H [place]: 00:46.3 CDE: 8.07    CYST EXCISION     FOREHEAD   FOOT SURGERY     CYST   RIGHT/LEFT HEART CATH AND CORONARY ANGIOGRAPHY N/A 03/31/2020   Procedure: RIGHT/LEFT HEART CATH AND CORONARY ANGIOGRAPHY;  Surgeon: Wellington Hampshire, MD;  Location: [place] [facility];  Service: Cardiovascular;  Laterality: N/A;   TUBAL LIGATION      Allergies: Altace [ramipril]  Medications: Prior to Admission medications   Medication Sig Start Date End Date Taking? Authorizing Provider  albuterol (PROVENTIL) (2.5 MG/3ML)  0.083% nebulizer solution Take 3 mLs (2.5 mg total) by nebulization every 4 (four) hours as needed for wheezing or shortness of breath. 01/23/21   Tanda Rockers, MD  albuterol (VENTOLIN HFA) 108 (90 Base) MCG/ACT inhaler Inhale 2 puffs into the lungs every 4 (four) hours as needed for wheezing or shortness of breath.     [provider]  amiodarone (PACERONE) 200 MG tablet Take 200 mg by mouth daily. 02/18/21   [provider]  apixaban (ELIQUIS) 5 MG TABS  tablet Take 1 tablet (5 mg total) by mouth 2 (two) times daily. 11/03/20   Minna Merritts, MD  atorvastatin (LIPITOR) 80 MG tablet Take 1 tablet (80 mg total) by mouth at bedtime. 11/03/20   Minna Merritts, MD  citalopram (CELEXA) 40 MG tablet Take 40 mg by mouth daily. 06/14/20   [provider]  diltiazem (CARDIZEM) 30 MG tablet Take 1 tablet (30 mg total) by mouth 3 (three) times daily as needed. For A-fib breakthrough 03/09/21   Minna Merritts, MD  docusate sodium (COLACE) 100 MG capsule Take 100 mg by mouth at bedtime as needed for mild constipation or moderate constipation.    [provider]  ezetimibe (ZETIA) 10 MG tablet Take 10 mg by mouth daily. 02/18/21   [provider]  famotidine (PEPCID) 40 MG tablet Take 40 mg by mouth at bedtime.    [provider]  fenofibrate 160 MG tablet Take 160 mg by mouth daily.    [provider]  ferrous sulfate 325 (65 FE) MG tablet Take 325 mg by mouth daily. 06/14/20   [provider]  gabapentin (NEURONTIN) 600 MG tablet Take 600 mg by mouth 2 (two) times daily.  11/20/19   [provider]  hydrOXYzine (VISTARIL) 50 MG capsule Take 50 mg by mouth every 6 (six) hours as needed for anxiety. 03/14/20   [provider]  irbesartan (AVAPRO) 75 MG tablet Take 1 tablet (75 mg total) by mouth daily. 01/02/21 12/28/21  Wouk, Ailene Rud, MD  LAGEVRIO 200 MG CAPS capsule SMARTSIG:4 Capsule(s) By Mouth Every 12 Hours 03/11/21   [provider]  levothyroxine (SYNTHROID) 75 MCG tablet Take 75 mcg by mouth daily before breakfast.    [provider]  lidocaine (LIDODERM) 5 % Place 3 patches onto the skin daily. (Remove after 12 hours and keep off for 12 hours)    [provider]  Melatonin 10 MG TABS Take 10 mg by mouth at bedtime as needed (sleep).    [provider]  metFORMIN (GLUCOPHAGE) 500 MG tablet Take 500 mg by mouth 2 (two) times daily. Patient not  taking: Reported on 03/30/2021 09/19/19   [provider]  nicotine (NICODERM CQ - DOSED IN MG/24 HOURS) 21 mg/24hr patch Place 1 patch (21 mg total) onto the skin daily. 08/15/20   Marrianne Mood D, PA-C  nystatin (MYCOSTATIN/NYSTOP) powder Apply topically 3 (three) times daily. 01/21/21   Lorella Nimrod, MD  oxyCODONE-acetaminophen (PERCOCET) 10-325 MG tablet Take 1 tablet by mouth 5 (five) times daily as needed for pain.    [provider]  pantoprazole (PROTONIX) 40 MG tablet Take 40 mg by mouth 2 (two) times daily. 11/11/20   [provider]  sitaGLIPtin (JANUVIA) 100 MG tablet Take 100 mg by mouth daily.    [provider]  Tiotropium Bromide-Olodaterol (STIOLTO RESPIMAT) 2.5-2.5 MCG/ACT AERS Inhale 2 puffs into the lungs daily. 01/23/21   Tanda Rockers, MD  torsemide (  DEMADEX) 20 MG tablet Take 1 tablet (20 mg total) by mouth daily. Alternating with 20 mg by mouth every other day 03/09/21   Minna Merritts, MD  Vitamin D, Ergocalciferol, (DRISDOL) 1.25 MG (50000 UNIT) CAPS capsule Take 50,000 Units by mouth once a week. Mondays    [provider]     Family History  Problem Relation Age of Onset   Heart disease Mother    Cancer Father     Social History   Socioeconomic History   Marital status: Widowed    Spouse name: Not on file   Number of children: 4   Years of education: Not on file   Highest education level: Not on file  Occupational History   Occupation: Health and safety inspector  Tobacco Use   Smoking status: Every Day    Packs/day: 1.00    Years: 50.00    Pack years: 50.00    Types: Cigarettes   Smokeless tobacco: Never   Tobacco comments:    4-5 cigs per day 01/23/2021  Vaping Use   Vaping Use: Never used  Substance and Sexual Activity   Alcohol use: Not Currently   Drug use: Never   Sexual activity: Never    Birth control/protection: None  Other Topics Concern   Not on file  Social History Narrative   [place]  with daughter; [name]- smokes 4-5 cig/day; no alcohol. Used to work in [employer]. 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    Social Determinants of Health   Financial Resource Strain: Not on file  Food Insecurity: Not on file  Transportation Needs: Not on file  Physical Activity: Not on file  Stress: Not on file  Social Connections: Not on file    Review of Systems: A 12 point ROS discussed and pertinent positives are indicated in the HPI above.  All other systems are negative.  Review of Systems  Vital Signs: LMP  (LMP Unknown)   Physical Exam  Imaging: No results found.  Labs:  CBC: Recent Labs    01/19/21 0529 01/20/21 0548 02/20/21 1506 03/30/21 0943  WBC 10.2 8.9 8.2 7.2  HGB 10.2* 9.9* 9.3* 9.8*  HCT 32.8* 30.8* 30.6* 32.0*  PLT 295 284 224 198    COAGS: Recent Labs    05/29/20 0859  INR 1.0    BMP: Recent Labs    01/19/21 0529 01/20/21 0548 02/20/21 1506 03/30/21 0943  NA 139 138 137 140  K 4.4 4.4 4.5 4.3  CL 97* 94* 96* 95*  CO2 34* 35* 35* 38*  GLUCOSE 160* 164* 114* 113*  BUN 42* 53* 32* 28*  CALCIUM 9.0 8.8* 8.6* 8.9  CREATININE 1.67* 1.68* 1.74* 1.62*  GFRNONAA 33* 33* 32* 34*    LIVER FUNCTION TESTS: Recent Labs    01/17/21 1018 01/19/21 0529 02/20/21 1506 03/30/21 0943  BILITOT 0.6 0.6 0.4 0.3  AST 20 15 17 16   ALT 15 14 12 10   ALKPHOS 40 32* 34* 37*  PROT 7.7 6.6 6.7 7.3  ALBUMIN 3.7 3.4* 3.3* 3.4*    TUMOR MARKERS: No results for input(s): AFPTM, CEA, CA199, CHROMGRNA in the last 8760 hours.  Assessment and Plan:  Right lung cancer; pending immunotherapy treatment plans: Kristin [name], 70 year old female, presents today to the [facility] Interventional Radiology department for an image-guided port-a-catheter placement.  Risks and benefits of image-guided Port-a-catheter placement were discussed with the patient including, but not limited to bleeding, infection, pneumothorax, or fibrin sheath development and need  for additional procedures.  All of the patient's questions were answered, patient is agreeable to proceed.  Consent signed and in chart.  Thank you for this interesting consult.  I greatly enjoyed meeting Kristin [name] and look forward to participating in their care.  A copy of this report was sent to the requesting provider on this date.  Electronically Signed: Soyla Dryer, AGACNP-BC [phone] 04/16/2021, 2:50 PM   I spent a total of  30 Minutes   in face to face in clinical consultation, greater than 50% of which was counseling/coordinating care for port-a-catheter placement

## 2021-04-17 ENCOUNTER — Ambulatory Visit
Admission: RE | Admit: 2021-04-17 | Discharge: 2021-04-17 | Disposition: A | Discharge status: Home / Self Care | Payer: Medicare Other | Source: Ambulatory Visit | Attending: Internal Medicine | Admitting: Internal Medicine

## 2021-04-19 ENCOUNTER — Inpatient Hospital Stay: Payer: Medicare Other

## 2021-04-19 ENCOUNTER — Emergency Department: Payer: Medicare Other

## 2021-04-19 ENCOUNTER — Inpatient Hospital Stay
Admission: EM | Admit: 2021-04-19 | Discharge: 2021-05-17 | DRG: 207 | Disposition: E | Payer: Medicare Other | Attending: Internal Medicine | Admitting: Internal Medicine

## 2021-04-19 ENCOUNTER — Encounter: Payer: Self-pay | Admitting: Internal Medicine

## 2021-04-19 ENCOUNTER — Other Ambulatory Visit: Payer: Self-pay

## 2021-04-19 DIAGNOSIS — J9600 Acute respiratory failure, unspecified whether with hypoxia or hypercapnia: Secondary | ICD-10-CM | POA: Diagnosis present

## 2021-04-19 DIAGNOSIS — N183 Chronic kidney disease, stage 3 unspecified: Secondary | ICD-10-CM | POA: Diagnosis present

## 2021-04-19 DIAGNOSIS — F17210 Nicotine dependence, cigarettes, uncomplicated: Secondary | ICD-10-CM | POA: Diagnosis present

## 2021-04-19 DIAGNOSIS — I4819 Other persistent atrial fibrillation: Secondary | ICD-10-CM | POA: Diagnosis present

## 2021-04-19 DIAGNOSIS — Z79890 Hormone replacement therapy (postmenopausal): Secondary | ICD-10-CM

## 2021-04-19 DIAGNOSIS — I5021 Acute systolic (congestive) heart failure: Secondary | ICD-10-CM

## 2021-04-19 DIAGNOSIS — A419 Sepsis, unspecified organism: Secondary | ICD-10-CM | POA: Diagnosis not present

## 2021-04-19 DIAGNOSIS — Z66 Do not resuscitate: Secondary | ICD-10-CM | POA: Diagnosis not present

## 2021-04-19 DIAGNOSIS — E8729 Other acidosis: Secondary | ICD-10-CM | POA: Diagnosis present

## 2021-04-19 DIAGNOSIS — Z20822 Contact with and (suspected) exposure to covid-19: Secondary | ICD-10-CM | POA: Diagnosis present

## 2021-04-19 DIAGNOSIS — J9690 Respiratory failure, unspecified, unspecified whether with hypoxia or hypercapnia: Secondary | ICD-10-CM

## 2021-04-19 DIAGNOSIS — J9622 Acute and chronic respiratory failure with hypercapnia: Secondary | ICD-10-CM | POA: Diagnosis present

## 2021-04-19 DIAGNOSIS — E1122 Type 2 diabetes mellitus with diabetic chronic kidney disease: Secondary | ICD-10-CM | POA: Diagnosis present

## 2021-04-19 DIAGNOSIS — I080 Rheumatic disorders of both mitral and aortic valves: Secondary | ICD-10-CM | POA: Diagnosis present

## 2021-04-19 DIAGNOSIS — J440 Chronic obstructive pulmonary disease with acute lower respiratory infection: Secondary | ICD-10-CM | POA: Diagnosis present

## 2021-04-19 DIAGNOSIS — I5033 Acute on chronic diastolic (congestive) heart failure: Secondary | ICD-10-CM | POA: Diagnosis present

## 2021-04-19 DIAGNOSIS — I130 Hypertensive heart and chronic kidney disease with heart failure and stage 1 through stage 4 chronic kidney disease, or unspecified chronic kidney disease: Secondary | ICD-10-CM | POA: Diagnosis present

## 2021-04-19 DIAGNOSIS — Z978 Presence of other specified devices: Secondary | ICD-10-CM | POA: Diagnosis not present

## 2021-04-19 DIAGNOSIS — R0902 Hypoxemia: Secondary | ICD-10-CM

## 2021-04-19 DIAGNOSIS — E1142 Type 2 diabetes mellitus with diabetic polyneuropathy: Secondary | ICD-10-CM | POA: Diagnosis present

## 2021-04-19 DIAGNOSIS — Z6841 Body Mass Index (BMI) 40.0 and over, adult: Secondary | ICD-10-CM | POA: Diagnosis not present

## 2021-04-19 DIAGNOSIS — G928 Other toxic encephalopathy: Secondary | ICD-10-CM | POA: Diagnosis present

## 2021-04-19 DIAGNOSIS — R6521 Severe sepsis with septic shock: Secondary | ICD-10-CM | POA: Diagnosis not present

## 2021-04-19 DIAGNOSIS — E039 Hypothyroidism, unspecified: Secondary | ICD-10-CM | POA: Diagnosis present

## 2021-04-19 DIAGNOSIS — N179 Acute kidney failure, unspecified: Secondary | ICD-10-CM | POA: Diagnosis present

## 2021-04-19 DIAGNOSIS — J9621 Acute and chronic respiratory failure with hypoxia: Secondary | ICD-10-CM | POA: Diagnosis not present

## 2021-04-19 DIAGNOSIS — E1165 Type 2 diabetes mellitus with hyperglycemia: Secondary | ICD-10-CM | POA: Diagnosis present

## 2021-04-19 DIAGNOSIS — G4733 Obstructive sleep apnea (adult) (pediatric): Secondary | ICD-10-CM | POA: Diagnosis present

## 2021-04-19 DIAGNOSIS — I272 Pulmonary hypertension, unspecified: Secondary | ICD-10-CM | POA: Diagnosis present

## 2021-04-19 DIAGNOSIS — C3490 Malignant neoplasm of unspecified part of unspecified bronchus or lung: Secondary | ICD-10-CM | POA: Diagnosis not present

## 2021-04-19 DIAGNOSIS — J9601 Acute respiratory failure with hypoxia: Secondary | ICD-10-CM | POA: Diagnosis not present

## 2021-04-19 DIAGNOSIS — D631 Anemia in chronic kidney disease: Secondary | ICD-10-CM | POA: Diagnosis present

## 2021-04-19 DIAGNOSIS — Z7901 Long term (current) use of anticoagulants: Secondary | ICD-10-CM

## 2021-04-19 DIAGNOSIS — J189 Pneumonia, unspecified organism: Secondary | ICD-10-CM | POA: Diagnosis present

## 2021-04-19 DIAGNOSIS — Z9289 Personal history of other medical treatment: Secondary | ICD-10-CM

## 2021-04-19 DIAGNOSIS — Z7189 Other specified counseling: Secondary | ICD-10-CM | POA: Diagnosis not present

## 2021-04-19 DIAGNOSIS — C3431 Malignant neoplasm of lower lobe, right bronchus or lung: Secondary | ICD-10-CM | POA: Diagnosis present

## 2021-04-19 DIAGNOSIS — I2510 Atherosclerotic heart disease of native coronary artery without angina pectoris: Secondary | ICD-10-CM | POA: Diagnosis present

## 2021-04-19 DIAGNOSIS — Z9981 Dependence on supplemental oxygen: Secondary | ICD-10-CM

## 2021-04-19 DIAGNOSIS — R410 Disorientation, unspecified: Secondary | ICD-10-CM | POA: Diagnosis not present

## 2021-04-19 DIAGNOSIS — R570 Cardiogenic shock: Secondary | ICD-10-CM | POA: Diagnosis present

## 2021-04-19 DIAGNOSIS — G9341 Metabolic encephalopathy: Secondary | ICD-10-CM | POA: Diagnosis not present

## 2021-04-19 DIAGNOSIS — Z8249 Family history of ischemic heart disease and other diseases of the circulatory system: Secondary | ICD-10-CM

## 2021-04-19 DIAGNOSIS — I480 Paroxysmal atrial fibrillation: Secondary | ICD-10-CM | POA: Diagnosis not present

## 2021-04-19 DIAGNOSIS — Z515 Encounter for palliative care: Secondary | ICD-10-CM

## 2021-04-19 DIAGNOSIS — I4892 Unspecified atrial flutter: Secondary | ICD-10-CM | POA: Diagnosis present

## 2021-04-19 DIAGNOSIS — I471 Supraventricular tachycardia: Secondary | ICD-10-CM | POA: Diagnosis present

## 2021-04-19 DIAGNOSIS — R579 Shock, unspecified: Secondary | ICD-10-CM | POA: Diagnosis not present

## 2021-04-19 DIAGNOSIS — I5031 Acute diastolic (congestive) heart failure: Secondary | ICD-10-CM | POA: Diagnosis not present

## 2021-04-19 DIAGNOSIS — F419 Anxiety disorder, unspecified: Secondary | ICD-10-CM | POA: Diagnosis present

## 2021-04-19 DIAGNOSIS — K219 Gastro-esophageal reflux disease without esophagitis: Secondary | ICD-10-CM | POA: Diagnosis present

## 2021-04-19 DIAGNOSIS — I495 Sick sinus syndrome: Secondary | ICD-10-CM | POA: Diagnosis present

## 2021-04-19 DIAGNOSIS — Z79899 Other long term (current) drug therapy: Secondary | ICD-10-CM

## 2021-04-19 DIAGNOSIS — E1151 Type 2 diabetes mellitus with diabetic peripheral angiopathy without gangrene: Secondary | ICD-10-CM | POA: Diagnosis present

## 2021-04-19 DIAGNOSIS — J441 Chronic obstructive pulmonary disease with (acute) exacerbation: Secondary | ICD-10-CM | POA: Diagnosis present

## 2021-04-19 DIAGNOSIS — E785 Hyperlipidemia, unspecified: Secondary | ICD-10-CM | POA: Diagnosis present

## 2021-04-19 DIAGNOSIS — Z7984 Long term (current) use of oral hypoglycemic drugs: Secondary | ICD-10-CM

## 2021-04-19 DIAGNOSIS — Z923 Personal history of irradiation: Secondary | ICD-10-CM

## 2021-04-19 DIAGNOSIS — R54 Age-related physical debility: Secondary | ICD-10-CM | POA: Diagnosis present

## 2021-04-19 DIAGNOSIS — I350 Nonrheumatic aortic (valve) stenosis: Secondary | ICD-10-CM

## 2021-04-19 DIAGNOSIS — Z4659 Encounter for fitting and adjustment of other gastrointestinal appliance and device: Secondary | ICD-10-CM

## 2021-04-19 DIAGNOSIS — D6489 Other specified anemias: Secondary | ICD-10-CM | POA: Diagnosis not present

## 2021-04-19 DIAGNOSIS — Z809 Family history of malignant neoplasm, unspecified: Secondary | ICD-10-CM

## 2021-04-19 DIAGNOSIS — Z0189 Encounter for other specified special examinations: Secondary | ICD-10-CM

## 2021-04-19 DIAGNOSIS — J9602 Acute respiratory failure with hypercapnia: Secondary | ICD-10-CM | POA: Diagnosis not present

## 2021-04-19 DIAGNOSIS — I472 Ventricular tachycardia, unspecified: Secondary | ICD-10-CM | POA: Diagnosis present

## 2021-04-19 DIAGNOSIS — Z888 Allergy status to other drugs, medicaments and biological substances status: Secondary | ICD-10-CM

## 2021-04-19 HISTORY — DX: Malignant (primary) neoplasm, unspecified: C80.1

## 2021-04-19 LAB — CBC WITH DIFFERENTIAL/PLATELET
Abs Immature Granulocytes: 0.14 10*3/uL — ABNORMAL HIGH (ref 0.00–0.07)
Basophils Absolute: 0.1 10*3/uL (ref 0.0–0.1)
Basophils Relative: 1 %
Eosinophils Absolute: 1 10*3/uL — ABNORMAL HIGH (ref 0.0–0.5)
Eosinophils Relative: 6 %
HCT: 36.3 % (ref 36.0–46.0)
Hemoglobin: 10.9 g/dL — ABNORMAL LOW (ref 12.0–15.0)
Immature Granulocytes: 1 %
Lymphocytes Relative: 14 %
Lymphs Abs: 2.1 10*3/uL (ref 0.7–4.0)
MCH: 27.6 pg (ref 26.0–34.0)
MCHC: 30 g/dL (ref 30.0–36.0)
MCV: 91.9 fL (ref 80.0–100.0)
Monocytes Absolute: 1.1 10*3/uL — ABNORMAL HIGH (ref 0.1–1.0)
Monocytes Relative: 7 %
Neutro Abs: 10.9 10*3/uL — ABNORMAL HIGH (ref 1.7–7.7)
Neutrophils Relative %: 71 %
Platelets: 305 10*3/uL (ref 150–400)
RBC: 3.95 MIL/uL (ref 3.87–5.11)
RDW: 13.7 % (ref 11.5–15.5)
WBC: 15.4 10*3/uL — ABNORMAL HIGH (ref 4.0–10.5)
nRBC: 0 % (ref 0.0–0.2)

## 2021-04-19 LAB — STREP PNEUMONIAE URINARY ANTIGEN: Strep Pneumo Urinary Antigen: NEGATIVE

## 2021-04-19 LAB — GLUCOSE, CAPILLARY
Glucose-Capillary: 157 mg/dL — ABNORMAL HIGH (ref 70–99)
Glucose-Capillary: 162 mg/dL — ABNORMAL HIGH (ref 70–99)
Glucose-Capillary: 170 mg/dL — ABNORMAL HIGH (ref 70–99)
Glucose-Capillary: 199 mg/dL — ABNORMAL HIGH (ref 70–99)
Glucose-Capillary: 290 mg/dL — ABNORMAL HIGH (ref 70–99)

## 2021-04-19 LAB — COMPREHENSIVE METABOLIC PANEL
ALT: 11 U/L (ref 0–44)
AST: 15 U/L (ref 15–41)
Albumin: 3.4 g/dL — ABNORMAL LOW (ref 3.5–5.0)
Alkaline Phosphatase: 44 U/L (ref 38–126)
Anion gap: 9 (ref 5–15)
BUN: 25 mg/dL — ABNORMAL HIGH (ref 8–23)
CO2: 35 mmol/L — ABNORMAL HIGH (ref 22–32)
Calcium: 9 mg/dL (ref 8.9–10.3)
Chloride: 94 mmol/L — ABNORMAL LOW (ref 98–111)
Creatinine, Ser: 1.67 mg/dL — ABNORMAL HIGH (ref 0.44–1.00)
GFR, Estimated: 33 mL/min — ABNORMAL LOW (ref >60)
Glucose, Bld: 264 mg/dL — ABNORMAL HIGH (ref 70–99)
Potassium: 4 mmol/L (ref 3.5–5.1)
Sodium: 138 mmol/L (ref 135–145)
Total Bilirubin: 0.6 mg/dL (ref 0.3–1.2)
Total Protein: 7.5 g/dL (ref 6.5–8.1)

## 2021-04-19 LAB — BLOOD GAS, ARTERIAL
Acid-Base Excess: 7.1 mmol/L — ABNORMAL HIGH (ref 0.0–2.0)
Bicarbonate: 34.5 mmol/L — ABNORMAL HIGH (ref 20.0–28.0)
FIO2: 0.4
Mode: 600
O2 Saturation: 98.8 %
PEEP: 5 cmH2O
Patient temperature: 37
RATE: 18 resp/min
pCO2 arterial: 67 mmHg (ref 32.0–48.0)
pH, Arterial: 7.32 — ABNORMAL LOW (ref 7.350–7.450)
pO2, Arterial: 131 mmHg — ABNORMAL HIGH (ref 83.0–108.0)

## 2021-04-19 LAB — RESP PANEL BY RT-PCR (FLU A&B, COVID) ARPGX2
Influenza A by PCR: NEGATIVE
Influenza B by PCR: NEGATIVE
SARS Coronavirus 2 by RT PCR: NEGATIVE

## 2021-04-19 LAB — BRAIN NATRIURETIC PEPTIDE: B Natriuretic Peptide: 245.2 pg/mL — ABNORMAL HIGH (ref 0.0–100.0)

## 2021-04-19 LAB — MRSA NEXT GEN BY PCR, NASAL: MRSA by PCR Next Gen: NOT DETECTED

## 2021-04-19 LAB — TROPONIN I (HIGH SENSITIVITY)
Troponin I (High Sensitivity): 12 ng/L (ref <18)
Troponin I (High Sensitivity): 17 ng/L (ref <18)

## 2021-04-19 LAB — PROCALCITONIN: Procalcitonin: <0.1 ng/mL

## 2021-04-19 LAB — LACTIC ACID, PLASMA: Lactic Acid, Venous: 1.9 mmol/L (ref 0.5–1.9)

## 2021-04-19 LAB — TSH: TSH: 0.985 u[IU]/mL (ref 0.350–4.500)

## 2021-04-19 MED ORDER — HYDROXYZINE HCL 50 MG PO TABS: 50.0000 mg | ORAL_TABLET | Freq: Four times a day (QID) | ORAL | Status: DC | PRN

## 2021-04-19 MED ORDER — POLYETHYLENE GLYCOL 3350 17 G PO PACK: 17.0000 g | PACK | Freq: Every day | ORAL | Status: DC | PRN

## 2021-04-19 MED ORDER — FENTANYL CITRATE PF 50 MCG/ML IJ SOSY
25.0000 ug | PREFILLED_SYRINGE | INTRAMUSCULAR | Status: DC | PRN
  Administered 2021-04-20: 50 ug via INTRAVENOUS
  Filled 2021-04-19: qty 1

## 2021-04-19 MED ORDER — EZETIMIBE 10 MG PO TABS
10.0000 mg | ORAL_TABLET | Freq: Every day | ORAL | Status: DC
  Administered 2021-04-20: 10 mg
  Filled 2021-04-19: qty 1

## 2021-04-19 MED ORDER — BUDESONIDE 0.25 MG/2ML IN SUSP
0.2500 mg | Freq: Two times a day (BID) | RESPIRATORY_TRACT | Status: DC
  Administered 2021-04-19 – 2021-04-29 (×21): 0.25 mg via RESPIRATORY_TRACT
  Filled 2021-04-19 (×21): qty 2

## 2021-04-19 MED ORDER — POLYETHYLENE GLYCOL 3350 17 G PO PACK
17.0000 g | PACK | Freq: Every day | ORAL | Status: DC
  Administered 2021-04-19 – 2021-04-20 (×2): 17 g
  Filled 2021-04-19 (×2): qty 1

## 2021-04-19 MED ORDER — MIDAZOLAM HCL 2 MG/2ML IJ SOLN: 1.0000 mg | INTRAMUSCULAR | Status: DC | PRN

## 2021-04-19 MED ORDER — APIXABAN 5 MG PO TABS
5.0000 mg | ORAL_TABLET | Freq: Two times a day (BID) | ORAL | Status: DC
  Administered 2021-04-19 – 2021-04-20 (×2): 5 mg
  Filled 2021-04-19 (×2): qty 1

## 2021-04-19 MED ORDER — FENTANYL CITRATE PF 50 MCG/ML IJ SOSY
25.0000 ug | PREFILLED_SYRINGE | INTRAMUSCULAR | Status: DC | PRN
  Administered 2021-04-20: 25 ug via INTRAVENOUS
  Filled 2021-04-19: qty 1

## 2021-04-19 MED ORDER — PROPOFOL 1000 MG/100ML IV EMUL
5.0000 ug/kg/min | INTRAVENOUS | Status: DC
  Administered 2021-04-19: 08:00:00 10 ug/kg/min via INTRAVENOUS
  Administered 2021-04-19: 16:00:00 20 ug/kg/min via INTRAVENOUS
  Administered 2021-04-20: 15 ug/kg/min via INTRAVENOUS
  Filled 2021-04-19 (×2): qty 100

## 2021-04-19 MED ORDER — IPRATROPIUM-ALBUTEROL 0.5-2.5 (3) MG/3ML IN SOLN: 3.0000 mL | Freq: Four times a day (QID) | RESPIRATORY_TRACT | Status: DC | PRN

## 2021-04-19 MED ORDER — PROPOFOL 1000 MG/100ML IV EMUL
INTRAVENOUS | Status: AC
  Filled 2021-04-19: qty 100

## 2021-04-19 MED ORDER — ROCURONIUM BROMIDE 10 MG/ML (PF) SYRINGE
PREFILLED_SYRINGE | INTRAVENOUS | Status: AC
  Administered 2021-04-19: 06:00:00 100 mg via INTRAVENOUS
  Filled 2021-04-19: qty 20

## 2021-04-19 MED ORDER — HEPARIN SODIUM (PORCINE) 5000 UNIT/ML IJ SOLN
5000.0000 [IU] | Freq: Three times a day (TID) | INTRAMUSCULAR | Status: DC
  Administered 2021-04-19 (×2): 5000 [IU] via SUBCUTANEOUS
  Filled 2021-04-19 (×2): qty 1

## 2021-04-19 MED ORDER — PROPOFOL 1000 MG/100ML IV EMUL
5.0000 ug/kg/min | INTRAVENOUS | Status: DC
  Administered 2021-04-19: 06:00:00 5 ug/kg/min via INTRAVENOUS
  Filled 2021-04-19: qty 100

## 2021-04-19 MED ORDER — SODIUM CHLORIDE 0.9 % IV SOLN
2.0000 g | INTRAVENOUS | Status: DC
  Administered 2021-04-19 – 2021-04-21 (×3): 2 g via INTRAVENOUS
  Filled 2021-04-19: qty 2
  Filled 2021-04-19: qty 20
  Filled 2021-04-19: qty 2
  Filled 2021-04-19: qty 20

## 2021-04-19 MED ORDER — DOCUSATE SODIUM 50 MG/5ML PO LIQD
100.0000 mg | Freq: Two times a day (BID) | ORAL | Status: DC | PRN
  Filled 2021-04-19: qty 10

## 2021-04-19 MED ORDER — DOCUSATE SODIUM 50 MG/5ML PO LIQD
100.0000 mg | Freq: Two times a day (BID) | ORAL | Status: DC
  Administered 2021-04-19 – 2021-04-20 (×3): 100 mg
  Filled 2021-04-19 (×4): qty 10

## 2021-04-19 MED ORDER — PROPOFOL 10 MG/ML IV BOLUS
INTRAVENOUS | Status: AC
  Filled 2021-04-19: qty 20

## 2021-04-19 MED ORDER — ATORVASTATIN CALCIUM 20 MG PO TABS
80.0000 mg | ORAL_TABLET | Freq: Every day | ORAL | Status: DC
  Administered 2021-04-19: 22:00:00 80 mg
  Filled 2021-04-19: qty 4

## 2021-04-19 MED ORDER — ORAL CARE MOUTH RINSE
15.0000 mL | OROMUCOSAL | Status: DC
  Administered 2021-04-19 – 2021-04-29 (×95): 15 mL via OROMUCOSAL

## 2021-04-19 MED ORDER — HYDROXYZINE HCL 50 MG PO TABS
50.0000 mg | ORAL_TABLET | Freq: Four times a day (QID) | ORAL | Status: DC | PRN
  Filled 2021-04-19: qty 1

## 2021-04-19 MED ORDER — METHYLPREDNISOLONE SODIUM SUCC 40 MG IJ SOLR
40.0000 mg | Freq: Two times a day (BID) | INTRAMUSCULAR | Status: DC
  Administered 2021-04-19 (×2): 40 mg via INTRAVENOUS
  Filled 2021-04-19 (×2): qty 1

## 2021-04-19 MED ORDER — IPRATROPIUM-ALBUTEROL 0.5-2.5 (3) MG/3ML IN SOLN
3.0000 mL | Freq: Four times a day (QID) | RESPIRATORY_TRACT | Status: DC
Start: 2021-04-19 — End: 2021-04-21
  Administered 2021-04-19 – 2021-04-20 (×7): 3 mL via RESPIRATORY_TRACT
  Filled 2021-04-19 (×8): qty 3

## 2021-04-19 MED ORDER — FENTANYL 2500MCG IN NS 250ML (10MCG/ML) PREMIX INFUSION
0.0000 ug/h | INTRAVENOUS | Status: DC
  Administered 2021-04-19: 25 ug/h via INTRAVENOUS
  Filled 2021-04-19: qty 250

## 2021-04-19 MED ORDER — CHLORHEXIDINE GLUCONATE CLOTH 2 % EX PADS
6.0000 | MEDICATED_PAD | Freq: Every day | CUTANEOUS | Status: DC
  Administered 2021-04-19 – 2021-04-29 (×7): 6 via TOPICAL

## 2021-04-19 MED ORDER — KETAMINE HCL 10 MG/ML IJ SOLN
INTRAMUSCULAR | Status: AC
  Filled 2021-04-19: qty 1

## 2021-04-19 MED ORDER — INSULIN ASPART 100 UNIT/ML IJ SOLN
0.0000 [IU] | INTRAMUSCULAR | Status: DC
  Administered 2021-04-19 (×2): 3 [IU] via SUBCUTANEOUS
  Administered 2021-04-19: 8 [IU] via SUBCUTANEOUS
  Administered 2021-04-19 (×2): 3 [IU] via SUBCUTANEOUS
  Administered 2021-04-20 – 2021-04-21 (×6): 2 [IU] via SUBCUTANEOUS
  Administered 2021-04-21 (×2): 3 [IU] via SUBCUTANEOUS
  Administered 2021-04-21: 2 [IU] via SUBCUTANEOUS
  Administered 2021-04-22: 3 [IU] via SUBCUTANEOUS
  Administered 2021-04-22: 5 [IU] via SUBCUTANEOUS
  Administered 2021-04-22: 2 [IU] via SUBCUTANEOUS
  Administered 2021-04-22 – 2021-04-23 (×5): 3 [IU] via SUBCUTANEOUS
  Administered 2021-04-23: 2 [IU] via SUBCUTANEOUS
  Administered 2021-04-23: 3 [IU] via SUBCUTANEOUS
  Administered 2021-04-23: 5 [IU] via SUBCUTANEOUS
  Administered 2021-04-24 (×2): 2 [IU] via SUBCUTANEOUS
  Administered 2021-04-24: 5 [IU] via SUBCUTANEOUS
  Administered 2021-04-24 (×2): 3 [IU] via SUBCUTANEOUS
  Administered 2021-04-24 – 2021-04-26 (×3): 2 [IU] via SUBCUTANEOUS
  Administered 2021-04-26 – 2021-04-27 (×7): 3 [IU] via SUBCUTANEOUS
  Administered 2021-04-27: 2 [IU] via SUBCUTANEOUS
  Administered 2021-04-27: 3 [IU] via SUBCUTANEOUS
  Administered 2021-04-27: 2 [IU] via SUBCUTANEOUS
  Administered 2021-04-28: 3 [IU] via SUBCUTANEOUS
  Administered 2021-04-28: 2 [IU] via SUBCUTANEOUS
  Administered 2021-04-28 (×3): 3 [IU] via SUBCUTANEOUS
  Administered 2021-04-28: 2 [IU] via SUBCUTANEOUS
  Administered 2021-04-29 (×2): 3 [IU] via SUBCUTANEOUS
  Filled 2021-04-19 (×51): qty 1

## 2021-04-19 MED ORDER — GABAPENTIN 600 MG PO TABS
600.0000 mg | ORAL_TABLET | Freq: Two times a day (BID) | ORAL | Status: DC
  Administered 2021-04-19 – 2021-04-20 (×2): 600 mg
  Filled 2021-04-19 (×3): qty 1

## 2021-04-19 MED ORDER — LEVOTHYROXINE SODIUM 50 MCG PO TABS
75.0000 ug | ORAL_TABLET | Freq: Every day | ORAL | Status: DC
  Administered 2021-04-20: 75 ug
  Filled 2021-04-19: qty 2

## 2021-04-19 MED ORDER — ROCURONIUM BROMIDE 50 MG/5ML IV SOLN
100.0000 mg | Freq: Once | INTRAVENOUS | Status: AC
  Filled 2021-04-19: qty 10

## 2021-04-19 MED ORDER — ROCURONIUM BROMIDE 50 MG/5ML IV SOLN: 120.0000 mg | Freq: Once | INTRAVENOUS | Status: DC

## 2021-04-19 MED ORDER — KETAMINE HCL 50 MG/5ML IJ SOSY
200.0000 mg | PREFILLED_SYRINGE | Freq: Once | INTRAMUSCULAR | Status: AC
  Administered 2021-04-19: 06:00:00 200 mg via INTRAVENOUS

## 2021-04-19 MED ORDER — FAMOTIDINE IN NACL 20-0.9 MG/50ML-% IV SOLN
20.0000 mg | Freq: Two times a day (BID) | INTRAVENOUS | Status: DC
  Administered 2021-04-19 – 2021-04-22 (×7): 20 mg via INTRAVENOUS
  Filled 2021-04-19 (×7): qty 50

## 2021-04-19 MED ORDER — OXYCODONE-ACETAMINOPHEN 10-325 MG PO TABS: 1.0000 | ORAL_TABLET | Freq: Four times a day (QID) | ORAL | Status: DC | PRN

## 2021-04-19 MED ORDER — CHLORHEXIDINE GLUCONATE 0.12% ORAL RINSE (MEDLINE KIT)
15.0000 mL | Freq: Two times a day (BID) | OROMUCOSAL | Status: DC
  Administered 2021-04-19 – 2021-04-29 (×20): 15 mL via OROMUCOSAL

## 2021-04-19 MED ORDER — SODIUM CHLORIDE 0.9 % IV SOLN
500.0000 mg | INTRAVENOUS | Status: DC
  Administered 2021-04-19 – 2021-04-21 (×3): 500 mg via INTRAVENOUS
  Filled 2021-04-19 (×4): qty 500

## 2021-04-19 NOTE — Progress Notes (Signed)
Patient transported from ER on transport vent with RN to ICU @0750 . Patient tolerated well.

## 2021-04-19 NOTE — Progress Notes (Addendum)
Initial Nutrition Assessment  DOCUMENTATION CODES:   Morbid obesity  INTERVENTION:   Once tube feeds initiated, recommend:  Vital HP @ 40ml/hr continuous   Propofol: 9.49 ml/hr- provides 250kcal/day  Free water flushes 45ml q4 hours to maintain tube patency   Regimen provides 1450kcal/day, 105g/day protein and 1155ml/day free water   Liquid MVI daily via tube   NUTRITION DIAGNOSIS:   Inadequate oral intake related to inability to eat (pt sedated and ventilated) as evidenced by NPO status.  GOAL:   Provide needs based on ASPEN/SCCM guidelines  MONITOR:   Vent status, Labs, Weight trends, Skin, I & O's  REASON FOR ASSESSMENT:   Ventilator    ASSESSMENT:   70 y.o. 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 fib on Eliquis who presented to [facility] ED from home due to acute respiratory distress.  RD working remotely.  Pt sedated and ventilated. OGT in place but needs advancement. Pt is known to this RD from recent previous admits. Pt with good appetite and oral intake at baseline. Per chart, pt appears weight stable pta.   Medications reviewed and include: colace, heparin, insulin, solu-medrol, miralax, azithromycin, ceftriaxone, pepcid, propofol   Labs reviewed: K 4.0 wnl, BUN 25(H), creat 1.67(H) Wbc- 15.4(H) Cbgs- 157, 170, 199 x 24 hrs  AIC 5.4- 8/9  Patient is currently intubated on ventilator support MV: 8.0 L/min Temp (24hrs), Avg:97.7 F (36.5 C), Min:97.7 F (36.5 C), Max:97.8 F (36.6 C)  Propofol: 9.49 ml/hr- provides 250kcal/day  MAP- >61mmHg   UOP- 37ml   NUTRITION - FOCUSED  PHYSICAL EXAM: Unable to perform at this time   Diet Order:   Diet Order             Diet NPO time specified  Diet effective now                  EDUCATION NEEDS:   No education needs have been identified at this time  Skin:  Skin Assessment: Reviewed RN Assessment  Last BM:  12/3- type 6  Height:   Ht Readings from Last 1 Encounters:  05/13/2021 5\' 2"  (1.575 m)    Weight:   Wt Readings from Last 1 Encounters:  04/16/2021 105.4 kg    Ideal Body Weight:  50 kg  BMI:  Body mass index is 42.5 kg/m.  Estimated Nutritional Needs:   Kcal:  1155-1470kcal/day  Protein:  >100g/day  Fluid:  1.3-1.5L/day  Koleen Distance MS, RD, LDN Please refer to [facility] for RD and/or RD on-call/weekend/after hours pager

## 2021-04-19 NOTE — Plan of Care (Signed)
  Problem: Education: Goal: Knowledge of General Education information will improve Description: Including pain rating scale, medication(s)/side effects and non-pharmacologic comfort measures Outcome: Not Progressing   Problem: Health Behavior/Discharge Planning: Goal: Ability to manage health-related needs will improve Outcome: Not Progressing   Problem: Clinical Measurements: Goal: Ability to maintain clinical measurements within normal limits will improve Outcome: Not Progressing Goal: Will remain free from infection Outcome: Not Progressing Goal: Diagnostic test results will improve Outcome: Not Progressing Goal: Respiratory complications will improve Outcome: Not Progressing Goal: Cardiovascular complication will be avoided Outcome: Not Progressing   Problem: Activity: Goal: Risk for activity intolerance will decrease Outcome: Not Progressing   Problem: Nutrition: Goal: Adequate nutrition will be maintained Outcome: Not Progressing   Problem: Coping: Goal: Level of anxiety will decrease Outcome: Not Progressing   Problem: Elimination: Goal: Will not experience complications related to bowel motility Outcome: Not Progressing Goal: Will not experience complications related to urinary retention Outcome: Not Progressing   Problem: Pain Managment: Goal: General experience of comfort will improve Outcome: Not Progressing   Problem: Safety: Goal: Ability to remain free from injury will improve Outcome: Not Progressing   Problem: Skin Integrity: Goal: Risk for impaired skin integrity will decrease Outcome: Not Progressing  Patient total care unable to wean intubated for resp distress

## 2021-04-19 NOTE — Progress Notes (Signed)
Pharmacy Electrolyte Monitoring Consult:  Pharmacy consulted to assist in monitoring and replacing electrolytes in this 70 y.o. female admitted on 04/25/2021 with Respiratory Distress   Labs:  Sodium (mmol/L)  Date Value  04/25/2021 138   Potassium (mmol/L)  Date Value  05/01/2021 4.0   Magnesium (mg/dL)  Date Value  01/20/2021 2.0   Phosphorus (mg/dL)  Date Value  12/23/2020 4.0   Calcium (mg/dL)  Date Value  05/10/2021 9.0   Albumin (g/dL)  Date Value  04/23/2021 3.4 (L)    Pt takes Torsemide PTA-   Assessment/Plan: No electrolyte replacement at this time F/u electrolytes in am  Cordarrell Sane A 04/25/2021 7:35 AM

## 2021-04-19 NOTE — ED Provider Notes (Signed)
[facility] Emergency Department Provider Note  ____________________________________________   Event Date/Time   First MD Initiated Contact with Patient 05/16/2021 [phone]     (approximate)  I have reviewed the triage vital signs and the nursing notes.   HISTORY  Chief Complaint Respiratory Distress  Level 5 caveat:  history/ROS limited by acute/critical illness  HPI Kristin [name] is a 70 y.o. female with medical history as listed below with very little history provided by family and paramedics.  The paramedic team was called out for respiratory difficulties.  They were told that her breathing has been getting worse over the last couple of days.  They also said that she had a history of CHF and COPD.  They found the patient with an oxygen saturation somewhere between the 50s and 70s, obviously tired but responsive.  However in spite of starting her on CPAP and giving her Solu-Medrol and 1 DuoNeb breathing treatment, she became less responsive during transit.  Upon arrival to the emergency department she was clearly obtunded and responsive only to very loud voice and painful stimuli.  When she wakes up she is able to deny pain but confirms severe shortness of breath.  Otherwise she can only speak in single words at a time.  She nodded her head slightly and said yes when I asked her if she has been feeling worse over the last couple of days.     Past Medical History:  Diagnosis Date   (HFpEF) heart failure with preserved ejection fraction ([place])    a. 09/2020 Echo: EF >55%; b. 10/2020 Echo: EF 55-60%. RVSP 49mmHg.   Anxiety    Asthma    CAD (coronary artery disease)    a. 03/2020 Cath: LM nl, LAD 20, LCX 60p, 2m/d, RCA 30p, 34m, RPDA 30-->Med Rx.   [facility])    COPD (chronic obstructive pulmonary disease) (HCC)    Diabetes mellitus without complication (HCC)    Dyspnea    GERD (gastroesophageal reflux disease)    History of hiatal hernia    History of  orthopnea    History of radiation therapy 07/29/20-08/08/20   Right Lung, SBRT Dr. Gery Pray   Hypothyroidism    Moderate aortic stenosis    a. 03/2020 Echo: Mod-sev AS. AVA 1.11cm^2. Mean grad 5mmHg; b. 10/2020 Echo: EF 55-60%, no rwma, nl RV fxn, RVSP 40mmHg, mild BAE, mod-sev mitral annular Ca2+ w/o MS/MR. Mod-sev AS. AVA 0.91cm^2 (VTI), mean grad 22mmHg.   Neuropathy    Oxygen deficiency    3L/HS   PAF (paroxysmal atrial fibrillation) ([place])    a. Post-termination pauses. Prev on amio.  CHA2DS2VASc 6-->eliqis; b. 01/2021 Zio: Predominantly RSR w/ avg HR 81 (41-194). 2 runs NSVT vs AF w/ aberrancy (longest 12 beats @ 185). 531 SVT runs (longest 11 beats; fastest 194). 10% Afib burden (80-185 bpm, avg 118). Longest Afib 86m49s. 19 pauses - longest 4.1secs - jxnl rhythm present. Isolated PACs (1.6%).   Pain    BACK/DDD   Peripheral vascular disease (HCC)    RLS (restless legs syndrome)    Sleep apnea    Wheezing     Patient Active Problem List   Diagnosis Date Noted   Acute respiratory failure ([place]) 04/18/2021   Primary cancer of right lower lobe of lung ([place]) 02/20/2021   COPD GOLD ? / active smoker 01/23/2021   Cigarette smoker 01/23/2021   Acute on chronic respiratory failure ([place]) 01/17/2021   Junctional rhythm 01/02/2021   Acute on chronic  renal failure ([place]) 01/02/2021   Tachycardia-bradycardia syndrome ([place]) 01/02/2021   Weakness    Acute hypoxemic respiratory failure ([place]) 12/23/2020   Acute exacerbation of chronic obstructive pulmonary disease (COPD) ([place]) 12/22/2020   Aspiration into airway    PAF (paroxysmal atrial fibrillation) (HCC)    CAD (coronary artery disease) 09/26/2020   Hyperkalemia 09/26/2020   CHF exacerbation ([place]) 09/26/2020   Acute on chronic respiratory failure with hypoxia and hypercapnia ([place]) 09/25/2020   Bradycardia 09/25/2020   Lung cancer ([place]) 05/26/2020   Atrial flutter with rapid ventricular response ([place]) 05/26/2020   Demand ischemia ([place])     Acute on chronic heart failure with preserved ejection fraction (HFpEF) ([place])    Aortic stenosis-moderate to severe 03/2020    Obesity, Class III, BMI 40-49.9 (morbid obesity) ([place]) 03/29/2020   Chronic respiratory failure with hypoxia ([place]) 03/29/2020   OSA (obstructive sleep apnea) 03/29/2020   Essential hypertension 03/29/2020   Hypothyroidism 03/29/2020   Chest pain 03/29/2020   Elevated troponin 03/29/2020   Chronic diastolic (congestive) heart failure ([place]) 07/12/2018   Type 2 diabetes mellitus without complication ([place]) 84/66/5993   COPD exacerbation ([place]) 07/12/2018   Tobacco use 07/12/2018   Hypotension 07/12/2018   Chronic respiratory failure with hypoxia and hypercapnia ([place]) 07/04/2018   Morbid obesity due to excess calories ([place]) 02/08/2016    Past Surgical History:  Procedure Laterality Date   BREAST SURGERY     CATARACT EXTRACTION W/PHACO Right 03/02/2018   Procedure: CATARACT EXTRACTION PHACO AND INTRAOCULAR LENS PLACEMENT ([place]);  Surgeon: Marchia Meiers, MD;  Location: [facility] ORS;  Service: Ophthalmology;  Laterality: Right;  [place] 01:15 CDE 16.46 Fluid pack lot # [id number]   CATARACT EXTRACTION W/PHACO Left 04/27/2018   Procedure: CATARACT EXTRACTION PHACO AND INTRAOCULAR LENS PLACEMENT ([place])- LEFT DIABETIC;  Surgeon: Marchia Meiers, MD;  Location: [facility] ORS;  Service: Ophthalmology;  Laterality: Left;  Lot # [id number] [place]: 00:46.3 CDE: 8.07    CYST EXCISION     FOREHEAD   FOOT SURGERY     CYST   RIGHT/LEFT HEART CATH AND CORONARY ANGIOGRAPHY N/A 03/31/2020   Procedure: RIGHT/LEFT HEART CATH AND CORONARY ANGIOGRAPHY;  Surgeon: Wellington Hampshire, MD;  Location: [place] [facility] LAB;  Service: Cardiovascular;  Laterality: N/A;   TUBAL LIGATION      Prior to Admission medications   Medication Sig Start Date End Date Taking? Authorizing Provider  albuterol (PROVENTIL) (2.5 MG/3ML) 0.083% nebulizer solution Take 3 mLs (2.5 mg total) by nebulization every 4 (four) hours  as needed for wheezing or shortness of breath. 01/23/21  Yes Tanda Rockers, MD  albuterol (VENTOLIN HFA) 108 (90 Base) MCG/ACT inhaler Inhale 2 puffs into the lungs every 4 (four) hours as needed for wheezing or shortness of breath.    Yes [provider]  amiodarone (PACERONE) 200 MG tablet Take 200 mg by mouth daily. 02/18/21  Yes [provider]  apixaban (ELIQUIS) 5 MG TABS tablet Take 1 tablet (5 mg total) by mouth 2 (two) times daily. 11/03/20  Yes Minna Merritts, MD  atorvastatin (LIPITOR) 80 MG tablet Take 1 tablet (80 mg total) by mouth at bedtime. 11/03/20  Yes Minna Merritts, MD  citalopram (CELEXA) 40 MG tablet Take 40 mg by mouth daily. 06/14/20  Yes [provider]  diltiazem (CARDIZEM) 30 MG tablet Take 1 tablet (30 mg total) by mouth 3 (three) times daily as needed. For A-fib breakthrough 03/09/21  Yes Gollan, Kathlene November, MD  docusate sodium (COLACE) 100  MG capsule Take 100 mg by mouth at bedtime as needed for mild constipation or moderate constipation.   Yes [provider]  ezetimibe (ZETIA) 10 MG tablet Take 10 mg by mouth daily. 02/18/21  Yes [provider]  famotidine (PEPCID) 40 MG tablet Take 40 mg by mouth at bedtime.   Yes [provider]  fenofibrate 160 MG tablet Take 160 mg by mouth daily.   Yes [provider]  ferrous sulfate 325 (65 FE) MG tablet Take 325 mg by mouth daily. 06/14/20  Yes [provider]  gabapentin (NEURONTIN) 600 MG tablet Take 600 mg by mouth 2 (two) times daily.  11/20/19  Yes [provider]  hydrOXYzine (VISTARIL) 50 MG capsule Take 50 mg by mouth every 6 (six) hours as needed for anxiety. 03/14/20  Yes [provider]  irbesartan (AVAPRO) 75 MG tablet Take 1 tablet (75 mg total) by mouth daily. 01/02/21 12/28/21 Yes Wouk, Ailene Rud, MD  LAGEVRIO 200 MG CAPS capsule SMARTSIG:4 Capsule(s) By Mouth Every 12 Hours 03/11/21  Yes [provider]   levothyroxine (SYNTHROID) 75 MCG tablet Take 75 mcg by mouth daily before breakfast.   Yes [provider]  lidocaine (LIDODERM) 5 % Place 3 patches onto the skin daily. (Remove after 12 hours and keep off for 12 hours)   Yes [provider]  Melatonin 10 MG TABS Take 10 mg by mouth at bedtime as needed (sleep).   Yes [provider]  metFORMIN (GLUCOPHAGE) 500 MG tablet Take 500 mg by mouth 2 (two) times daily. 09/19/19  Yes [provider]  nystatin (MYCOSTATIN/NYSTOP) powder Apply topically 3 (three) times daily. 01/21/21  Yes Lorella Nimrod, MD  oxyCODONE-acetaminophen (PERCOCET) 10-325 MG tablet Take 1 tablet by mouth 5 (five) times daily as needed for pain.   Yes [provider]  pantoprazole (PROTONIX) 40 MG tablet Take 40 mg by mouth 2 (two) times daily. 11/11/20  Yes [provider]  sitaGLIPtin (JANUVIA) 100 MG tablet Take 100 mg by mouth daily.   Yes [provider]  Tiotropium Bromide-Olodaterol (STIOLTO RESPIMAT) 2.5-2.5 MCG/ACT AERS Inhale 2 puffs into the lungs daily. 01/23/21  Yes Tanda Rockers, MD  torsemide (DEMADEX) 20 MG tablet Take 1 tablet (20 mg total) by mouth daily. Alternating with 20 mg by mouth every other day Patient taking differently: Take 20-40 mg by mouth daily. Takes 20 mg qod and 40 mg other days 03/09/21  Yes Gollan, Kathlene November, MD  Vitamin D, Ergocalciferol, (DRISDOL) 1.25 MG (50000 UNIT) CAPS capsule Take 50,000 Units by mouth once a week. Mondays   Yes [provider]  MOVANTIK 25 MG TABS tablet Take 25 mg by mouth daily. Patient not taking: Reported on 05/14/2021 04/03/21   [provider]  nicotine (NICODERM CQ - DOSED IN MG/24 HOURS) 21 mg/24hr patch Place 1 patch (21 mg total) onto the skin daily. Patient not taking: Reported on 04/18/2021 08/15/20   Marrianne Mood D, PA-C    Allergies Altace [ramipril]  Family History  Problem Relation Age of Onset   Heart disease Mother     Cancer Father     Social History Social History   Tobacco Use   Smoking status: Every Day    Packs/day: 1.00    Years: 50.00    Pack years: 50.00    Types: Cigarettes   Smokeless tobacco: Never   Tobacco comments:    4-5 cigs per day 01/23/2021  Vaping Use   Vaping  Use: Never used  Substance Use Topics   Alcohol use: Not Currently   Drug use: Never    Review of Systems Level 5 caveat:  history/ROS limited by acute/critical illness  ____________________________________________   PHYSICAL EXAM:  VITAL SIGNS: ED Triage Vitals  Enc Vitals Group     BP 05/01/2021 0553 (!) 106/56     Pulse Rate 04/20/2021 0551 70     Resp 04/26/2021 0551 10     Temp 04/20/2021 0609 97.8 F (36.6 C)     Temp Source 04/26/2021 0609 Axillary     SpO2 05/04/2021 0551 93 %     Weight 05/09/2021 0550 114 kg (251 lb 5.2 oz)     Height --      Head Circumference --      Peak Flow --      Pain Score --      Pain Loc --      Pain Edu? --      Excl. in [place]? --     Constitutional: Obtunded, responsive only to painful or loud stimuli. Eyes: Conjunctivae are normal.  Head: Atraumatic. Nose: No congestion/rhinnorhea. Mouth/Throat: Patient is wearing a mask. Neck: No stridor.  No meningeal signs.   Cardiovascular: Normal rate, regular rhythm.  Respiratory: Increased respiratory effort with coarse breath sounds throughout but equal bilaterally. Gastrointestinal: Obese.  Soft and nontender. No distention.  Musculoskeletal: No lower extremity tenderness nor edema. No gross deformities of extremities. Neurologic:  No gross focal neurologic deficits are appreciated.  Skin:  Skin is cool, diaphoretic, mottled.   ____________________________________________   LABS (all labs ordered are listed, but only abnormal results are displayed)  Labs Reviewed  CBC WITH DIFFERENTIAL/PLATELET - Abnormal; Notable for the following components:      Result Value   WBC 15.4 (*)    Hemoglobin 10.9 (*)    Neutro Abs 10.9  (*)    Monocytes Absolute 1.1 (*)    Eosinophils Absolute 1.0 (*)    Abs Immature Granulocytes 0.14 (*)    All other components within normal limits  COMPREHENSIVE METABOLIC PANEL - Abnormal; Notable for the following components:   Chloride 94 (*)    CO2 35 (*)    Glucose, Bld 264 (*)    BUN 25 (*)    Creatinine, Ser 1.67 (*)    Albumin 3.4 (*)    GFR, Estimated 33 (*)    All other components within normal limits  BLOOD GAS, ARTERIAL - Abnormal; Notable for the following components:   pH, Arterial 7.32 (*)    pCO2 arterial 67 (*)    pO2, Arterial 131 (*)    Bicarbonate 34.5 (*)    Acid-Base Excess 7.1 (*)    All other components within normal limits  BRAIN NATRIURETIC PEPTIDE - Abnormal; Notable for the following components:   B Natriuretic Peptide 245.2 (*)    All other components within normal limits  RESP PANEL BY RT-PCR (FLU A&B, COVID) ARPGX2  CULTURE, BLOOD (ROUTINE X 2)  CULTURE, BLOOD (ROUTINE X 2)  LACTIC ACID, PLASMA  PROCALCITONIN  HEMOGLOBIN A1C  TROPONIN I (HIGH SENSITIVITY)  TROPONIN I (HIGH SENSITIVITY)   ____________________________________________  EKG  ED ECG REPORT I, Hinda Kehr, the attending physician, personally viewed and interpreted this ECG.  Date: 05/02/2021 EKG Time: 5:51 AM Rate: 66 Rhythm: normal sinus rhythm QRS Axis: normal Intervals: normal ST/T Wave abnormalities: Non-specific ST segment / T-wave changes, but no clear evidence of acute ischemia. Narrative Interpretation: no definitive evidence of acute  ischemia; does not meet STEMI criteria.  ____________________________________________  RADIOLOGY I, Hinda Kehr, personally viewed and evaluated these images (plain radiographs) as part of my medical decision making, as well as reviewing the written report by the radiologist.    ED MD interpretation: Chest x-ray demonstrates endotracheal tube in appropriate position with evidence of pulmonary edema. Based on abdominal x-ray,  enteric tube needs to be advanced about 5 cm.  Official radiology report(s): DG Chest 1 View  Result Date: 05/04/2021 CLINICAL DATA:  Status post intubation. EXAM: CHEST  1 VIEW COMPARISON:  01/18/2021 FINDINGS: 0615 hours. Endotracheal tube tip is 4.1 cm above the base of the carina. The NG tube passes into the stomach although the distal tip position is not included on the film. The cardio pericardial silhouette is enlarged. Diffuse interstitial opacity suggest pulmonary edema. The visualized bony structures of the thorax show no acute abnormality. Telemetry leads overlie the chest. IMPRESSION: No substantial interval change. Cardiomegaly with interstitial pulmonary edema pattern. Electronically Signed   By: Misty Stanley M.D.   On: 04/23/2021 06:31   DG Abdomen 1 View  Result Date: 05/16/2021 CLINICAL DATA:  70 year old female OG tube placement. EXAM: ABDOMEN - 1 VIEW COMPARISON:  09/27/2020. FINDINGS: Portable AP view at 0639 hours. Enteric tube courses in the midline to the diaphragm. Tip and side hole projects just below the diaphragm, but are likely at or near the GEJ. Resolved gaseous distension of the stomach seen in May. Negative visible bowel gas pattern, lung bases. No acute osseous abnormality identified. IMPRESSION: Enteric tube tip and side hole at or near the GEJ. Recommend advancing 5 more centimeters to ensure side hole placement within the stomach. Electronically Signed   By: Genevie Ann M.D.   On: 05/12/2021 07:12    ____________________________________________   PROCEDURES   Procedure(s) performed (including Critical Care):  .Critical Care Performed by: Hinda Kehr, MD Authorized by: Hinda Kehr, MD   Critical care provider statement:    Critical care time (minutes):  45   Critical care time was exclusive of:  Separately billable procedures and treating other patients   Critical care was necessary to treat or prevent imminent or life-threatening deterioration of the  following conditions:  Respiratory failure and CNS failure or compromise   Critical care was time spent personally by me on the following activities:  Development of treatment plan with patient or surrogate, evaluation of patient's response to treatment, examination of patient, obtaining history from patient or surrogate, ordering and performing treatments and interventions, ordering and review of laboratory studies, ordering and review of radiographic studies, pulse oximetry, re-evaluation of patient's condition and review of old charts Procedure Name: Intubation Date/Time: 05/01/2021 6:15 AM Performed by: Hinda Kehr, MD Pre-anesthesia Checklist: Patient identified, Emergency Drugs available, Suction available and Patient being monitored Oxygen Delivery Method: Non-rebreather mask Preoxygenation: Pre-oxygenation with 100% oxygen Induction Type: IV induction and Rapid sequence Laryngoscope Size: Glidescope and 3 Tube size: 7.5 mm Number of attempts: 1 Placement Confirmation: ETT inserted through vocal cords under direct vision, CO2 detector and Breath sounds checked- equal and bilateral Secured at: 22 cm Tube secured with: ETT holder Dental Injury: Teeth and Oropharynx as per pre-operative assessment     .1-3 Lead EKG Interpretation Performed by: Hinda Kehr, MD Authorized by: Hinda Kehr, MD     Interpretation: normal     ECG rate:  70   Rhythm: sinus rhythm     Ectopy: none     Conduction: normal     ____________________________________________  INITIAL IMPRESSION / MDM / ASSESSMENT AND PLAN / ED COURSE  As part of my medical decision making, I reviewed the following data within the [place] notes reviewed and incorporated, Labs reviewed , EKG interpreted , Old chart reviewed, Radiograph reviewed , Discussed with admitting physician Domingo Pulse Rust[place], ICU NP), and reviewed Notes from prior ED visits   Differential diagnosis includes,  but is not limited to, CHF exacerbation, COPD exacerbation, PE, ACS, sepsis, intracranial hemorrhage or CVA.  The patient is on the cardiac monitor to evaluate for evidence of arrhythmia and/or significant heart rate changes.  Symptoms are most consistent with CHF versus COPD exacerbation but most likely CHF because the patient has coarse breath sounds but no wheezing or tightness.  She was obtunded and clearly tired from a respiratory perspective upon arrival.  She would respond but was able to do so only minimally.  When I asked her if she wanted me to put a tube down her throat and put her on a breathing machine she nodded her head yes.  Intubation successful with no complications.  Her blood pressure was about 622 systolic initially.  I held off on IV fluids but had 1 L of fluid available.  Induction was with ketamine 200 mg and paralytic was rocuronium 100 mg.  I started a low-dose propofol drip for sedation afterwards and unfortunately this dropped her blood pressure substantially so we turned off sedation and started 1 L fluid bolus.  We will continue to monitor.     Clinical Course as of 04/29/2021 2979  Nancy Fetter Apr 19, 2021  0623 WBC(!): 15.4 [CF]  8921 DG Chest 1 View I personally reviewed the patient's imaging and agree with the radiologist's interpretation that the endotracheal tube is in appropriate position but there is evidence of pulmonary edema. [CF]  0646 BP up to 90/54, MAP 66.  Initially it dropped substantially when we began the propofol infusion so she is currently not on any sedation and is getting 1 L of crystalloid by pressure bag.  I discussed the case by phone with Domingo Pulse [place], ICU NP.  She will put in admission orders and Dr. Mortimer Fries will see the patient.  She had the opportunity to look of the patient's record and see that she has severe aortic stenosis diagnosed by echocardiogram just about 3 days ago.  She agrees with the current plan. [CF]  [phone] Comprehensive  metabolic panel(!) Stable comprehensive metabolic panel with stable chronic kidney disease. [CF]  7408 CBC with Differential(!) CBC with a leukocytosis of 15.4, could be reactive or indicative of infection. [CF]  1448 Lactic Acid, Venous: 1.9 Normal lactic acid [CF]  0648 Troponin I (High Sensitivity): 12 Reassuring high-sensitivity troponin [CF]    Clinical Course User Index [CF] Hinda Kehr, MD     ____________________________________________  FINAL CLINICAL IMPRESSION(S) / ED DIAGNOSES  Final diagnoses:  Acute respiratory failure with hypoxia ([place])     MEDICATIONS GIVEN DURING THIS VISIT:  Medications  ketamine (KETALAR) 10 MG/ML injection (  Not Given 05/04/2021 0653)  docusate (COLACE) 50 MG/5ML liquid 100 mg (has no administration in time range)  polyethylene glycol (MIRALAX / GLYCOLAX) packet 17 g (has no administration in time range)  heparin injection 5,000 Units (has no administration in time range)  famotidine (PEPCID) IVPB 20 mg premix (has no administration in time range)  docusate (COLACE) 50 MG/5ML liquid 100 mg (has no administration in time range)  polyethylene glycol (MIRALAX / GLYCOLAX) packet 17  g (has no administration in time range)  fentaNYL (SUBLIMAZE) injection 25 mcg (has no administration in time range)  fentaNYL (SUBLIMAZE) injection 25-100 mcg (has no administration in time range)  midazolam (VERSED) injection 1 mg (has no administration in time range)  midazolam (VERSED) injection 1 mg (has no administration in time range)  insulin aspart (novoLOG) injection 0-15 Units (has no administration in time range)  ipratropium-albuterol (DUONEB) 0.5-2.5 (3) MG/3ML nebulizer solution 3 mL (has no administration in time range)  ketamine 50 mg in normal saline 5 mL (10 mg/mL) syringe (200 mg Intravenous Given 05/14/2021 0601)  rocuronium (ZEMURON) injection 100 mg (100 mg Intravenous Given 05/03/2021 0603)     ED Discharge Orders     None        Note:   This document was prepared using Dragon voice recognition software and may include unintentional dictation errors.   Hinda Kehr, MD 04/17/2021 [phone]

## 2021-04-19 NOTE — ED Triage Notes (Signed)
Pt arrives on cpap, diaphoretic, in resp distress. Pt with pox 32s. Md at bedside. Pt with arousal to painful stimuli.

## 2021-04-19 NOTE — H&P (Addendum)
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  Per ED notes and EMS run sheet, patient was found with an oxygen saturation somewhere between the 50s and 70s, obviously tired but responsive. Pt placed on CPAP en route to the ER by EMS and treated with Duonebs and IV solumedrol. However in spite of starting her on CPAP and giving her Solu-Medrol and 1 DuoNeb breathing treatment, she became less responsive during transit.   ED Course: On arrival to the ED, she was afebrile with blood pressure 106/56 mm Hg and pulse rate 70 beats/min., RR 10 with sats 93% on CPAP.  Per ED notes, she was obtunded and responsive only to very loud voice and painful stimuli. Patient was intubated for airway protection. Pertinent Labs in Red/Diagnostics Findings: Na+/ K+: 138/4.0 Glucose: 264 BUN/Cr.: 25/1.67 Calcium: 8.3   WBC/ TMAX: 15.4/ afebrile Hgb/Hct: 10.9/36.3 Plts: 305 PCT: negative <0.10 Lactic acid: 1.9 COVID PCR: Negative   Troponin: 12 BNP: 245.2 Arterial Blood Gas result:  pO2 131; pCO2 67; pH 7.32;  HCO3 34.5, %O2 Sat 98.8. EKG: normal EKG, normal sinus rhythm, normal sinus rhythm, Non-specific ST segment / T-wave changes, but no clear evidence of acute ischemia. CXR: No substantial interval change. Cardiomegaly with interstitial pulmonary edema pattern  Patient was started a low-dose propofol drip for sedation afterwards  and unfortunately this dropped her blood pressure substantially so was turned off sedation and started 1 L fluid bolus. PCCM consulted for admission and further management.  Past Medical History    Acute respiratory failure ([place]) 04/17/2021   Primary cancer of right lower lobe of lung ([place]) 02/20/2021   COPD GOLD ? 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/02/2021   Weakness     Acute hypoxemic respiratory failure ([place]) 12/23/2020   Acute exacerbation of chronic obstructive pulmonary disease (COPD) ([place]) 12/22/2020   Aspiration into airway     PAF (paroxysmal atrial fibrillation) (HCC)     CAD (coronary artery disease) 09/26/2020   Hyperkalemia 09/26/2020   CHF exacerbation ([place]) 09/26/2020   Acute on chronic respiratory failure with hypoxia and hypercapnia ([place]) 09/25/2020   Bradycardia 09/25/2020   Lung cancer ([place]) 05/26/2020   Atrial flutter with rapid ventricular response ([place]) 05/26/2020   Demand ischemia ([place])     Acute on chronic heart failure with preserved ejection fraction (HFpEF) ([place])     Aortic stenosis-moderate to severe 03/2020     Obesity, Class III, BMI 40-49.9 (morbid obesity) ([place]) 03/29/2020   Chronic respiratory failure with hypoxia ([place]) 03/29/2020   OSA (obstructive sleep apnea) 03/29/2020   Essential hypertension 03/29/2020   Hypothyroidism 03/29/2020   Chest pain 03/29/2020   Elevated troponin 03/29/2020   Chronic diastolic (congestive) heart failure ([place]) 07/12/2018   Type 2 diabetes mellitus without complication ([place]) 48/54/6270   COPD exacerbation (  [place]) 07/12/2018   Tobacco use 07/12/2018   Hypotension 07/12/2018   Chronic respiratory failure with hypoxia and hypercapnia ([place]) 07/04/2018   Morbid obesity due to excess calories ([place]) 02/08/2016     Significant Hospital Events   12/4: Admitted to the ICU with acute on chronic hypoxic hypercapnic respiratory failure.  Consults:  PCCM  Procedures:  12/4> Intubation  Significant Diagnostic Tests:  12/4: Chest Xray>No substantial interval change. Cardiomegaly with interstitial pulmonary edema pattern 12/4: Abdominal xray>Enteric tube tip and side hole at or near the GEJ. Recommend advancing 5 more centimeters to ensure side hole placement within the stomach  Micro Data:  12/4: SARS-CoV-2 PCR> negative 12/4: Influenza PCR> negative 12/4: Blood culture x2> 12/4: Urine Culture> 12/4: MRSA PCR>>  12/4: Strep pneumo urinary antigen> 12/4: Legionella urinary antigen>  Antimicrobials:  None  OBJECTIVE  Blood pressure (!) 141/52, pulse 71, temperature 97.7 F (36.5 C), temperature source Oral, resp. rate 16, weight 105.4 kg, SpO2 100 %.    Vent Mode: AC FiO2 (%):  [40 %-100 %] 40 % PEEP:  [5 cmH20] 5 cmH20  No intake or output data in the 24 hours ending 05/12/2021 0832 Filed Weights   05/08/2021 0550 05/12/2021 0750  Weight: 114 kg 105.4 kg     Physical Examination  GENERAL: 70 year-old critically ill patient lying in the bed intubated and sedated EYES: Pupils equal, round, reactive to light and accommodation. No scleral icterus. Extraocular muscles intact.  HEENT: Head atraumatic, normocephalic. Oropharynx and nasopharynx clear.  NECK:  Supple, no jugular venous distention. No thyroid enlargement, no tenderness.  LUNGS: Decreased breath sounds bilaterally, no wheezing, rales,rhonchi or crepitation. No use of accessory muscles of respiration.  CARDIOVASCULAR: S1, S2 normal. No murmurs, rubs, or gallops.  ABDOMEN: Soft, nontender, nondistended. Bowel sounds present. No organomegaly or mass.  EXTREMITIES: No pedal edema, cyanosis, or clubbing.  NEUROLOGIC: Cranial nerves II through XII are intact.  Muscle strength not assesed. Sensation intact. Gait not checked.  PSYCHIATRIC: The  patient is intubated and sedated SKIN: No obvious rash, lesion, or ulcer.   Labs/imaging that I havepersonally reviewed  (right click and "Reselect all SmartList Selections" daily)     Labs   CBC: Recent Labs  Lab 05/11/2021 0608  WBC 15.4*  NEUTROABS 10.9*  HGB 10.9*  HCT 36.3  MCV 91.9  PLT 767    Basic Metabolic Panel: Recent Labs  Lab 04/18/2021 0608  NA 138  K 4.0  CL 94*  CO2 35*  GLUCOSE 264*  BUN 25*  CREATININE 1.67*  CALCIUM 9.0   GFR: Estimated Creatinine Clearance: 36.7 mL/min (A) (by C-G formula based on SCr of 1.67 mg/dL (H)). Recent Labs  Lab 05/09/2021 0608  WBC 15.4*  LATICACIDVEN 1.9    Liver Function Tests: Recent Labs  Lab 04/23/2021 0608  AST 15  ALT 11  ALKPHOS 44  BILITOT 0.6  PROT 7.5  ALBUMIN 3.4*   No results for input(s): LIPASE, AMYLASE in the last 168 hours. No results for input(s): AMMONIA in the last 168 hours.  ABG    Component Value Date/Time   PHART 7.32 (L) 05/15/2021 0608   PCO2ART 67 (HH) 04/18/2021 0608   PO2ART 131 (H) 05/05/2021 0608   HCO3 34.5 (H) 04/29/2021 0608   O2SAT 98.8 04/16/2021 0608     Coagulation Profile: No results for input(s): INR, PROTIME in the last 168 hours.  Cardiac Enzymes: No results for input(s): CKTOTAL, CKMB, CKMBINDEX, TROPONINI in the last 168 hours.  HbA1C:  Hgb A1c MFr Bld  Date/Time Value Ref Range Status  12/23/2020 05:09 AM 5.4 4.8 - 5.6 % Final    Comment:    (NOTE) Pre diabetes:          5.7%-6.4%  Diabetes:              >6.4%  Glycemic control for   <7.0% adults with diabetes   09/26/2020 01:26 AM 6.2 (H) 4.8 - 5.6 % Final    Comment:    (NOTE) Pre diabetes:          5.7%-6.4%  Diabetes:              >6.4%  Glycemic control for   <7.0% adults with diabetes     CBG: Recent Labs  Lab 04/26/2021 0754  GLUCAP 199*    Review of Systems:   Unable to obtain due to patient currently intubated and sedated  Past Medical History  She,  has a past medical  history of (HFpEF) heart failure with preserved ejection fraction ([place]), Anxiety, Asthma, CAD (coronary artery disease), Cancer ([place]), COPD (chronic obstructive pulmonary disease) ([place]), Diabetes mellitus without complication ([place]), Dyspnea, GERD (gastroesophageal reflux disease), History of hiatal hernia, History of orthopnea, History of radiation therapy (07/29/20-08/08/20), Hypothyroidism, Moderate aortic stenosis, Neuropathy, Oxygen deficiency, PAF (paroxysmal atrial fibrillation) ([place]), Pain, Peripheral vascular disease ([place]), RLS (restless legs syndrome), Sleep apnea, and Wheezing.   Surgical History    Past Surgical History:  Procedure Laterality Date   BREAST SURGERY     CATARACT EXTRACTION W/PHACO Right 03/02/2018   Procedure: CATARACT EXTRACTION PHACO AND INTRAOCULAR LENS PLACEMENT ([place]);  Surgeon: Marchia Meiers, MD;  Location: [facility] ORS;  Service: Ophthalmology;  Laterality: Right;  [place] 01:15 CDE 16.46 Fluid pack lot # [id number]   CATARACT EXTRACTION W/PHACO Left 04/27/2018   Procedure: CATARACT EXTRACTION PHACO AND INTRAOCULAR LENS PLACEMENT ([place])- LEFT DIABETIC;  Surgeon: Marchia Meiers, MD;  Location: [facility] ORS;  Service: Ophthalmology;  Laterality: Left;  Lot # [id number] H [place]: 00:46.3 CDE: 8.07    CYST EXCISION     FOREHEAD   FOOT SURGERY     CYST   RIGHT/LEFT HEART CATH AND CORONARY ANGIOGRAPHY N/A 03/31/2020   Procedure: RIGHT/LEFT HEART CATH AND CORONARY ANGIOGRAPHY;  Surgeon: Wellington Hampshire, MD;  Location: [facility];  Service: Cardiovascular;  Laterality: N/A;   TUBAL LIGATION       Social History   reports that she has been smoking cigarettes. She has a 50.00 pack-year smoking history. She has never used smokeless tobacco. She reports that she does not currently use alcohol. She reports that she does not use drugs.   Family History   Her family history includes Cancer in her father; Heart disease in her mother.   Allergies Allergies  Allergen Reactions    Altace [Ramipril] Swelling     Home Medications  Prior to Admission medications   Medication Sig Start Date End Date Taking? Authorizing Provider  albuterol (PROVENTIL) (2.5 MG/3ML) 0.083% nebulizer solution Take 3 mLs (2.5 mg total) by nebulization every 4 (four) hours as needed for wheezing or shortness of breath. 01/23/21  Yes Tanda Rockers, MD  albuterol (VENTOLIN HFA) 108 (90 Base) MCG/ACT inhaler Inhale 2 puffs into the lungs every 4 (four) hours as needed for wheezing or shortness of breath.    Yes [provider]  amiodarone (PACERONE) 200 MG tablet Take 200 mg by mouth daily. 02/18/21  Yes [provider]  apixaban (ELIQUIS) 5 MG TABS tablet  Take 1 tablet (5 mg total) by mouth 2 (two) times daily. 11/03/20  Yes Minna Merritts, MD  atorvastatin (LIPITOR) 80 MG tablet Take 1 tablet (80 mg total) by mouth at bedtime. 11/03/20  Yes Minna Merritts, MD  citalopram (CELEXA) 40 MG tablet Take 40 mg by mouth daily. 06/14/20  Yes [provider]  diltiazem (CARDIZEM) 30 MG tablet Take 1 tablet (30 mg total) by mouth 3 (three) times daily as needed. For A-fib breakthrough 03/09/21  Yes Gollan, Kathlene November, MD  docusate sodium (COLACE) 100 MG capsule Take 100 mg by mouth at bedtime as needed for mild constipation or moderate constipation.   Yes [provider]  ezetimibe (ZETIA) 10 MG tablet Take 10 mg by mouth daily. 02/18/21  Yes [provider]  famotidine (PEPCID) 40 MG tablet Take 40 mg by mouth at bedtime.   Yes [provider]  fenofibrate 160 MG tablet Take 160 mg by mouth daily.   Yes [provider]  ferrous sulfate 325 (65 FE) MG tablet Take 325 mg by mouth daily. 06/14/20  Yes [provider]  gabapentin (NEURONTIN) 600 MG tablet Take 600 mg by mouth 2 (two) times daily.  11/20/19  Yes [provider]  hydrOXYzine (VISTARIL) 50 MG capsule Take 50 mg by mouth every 6 (six) hours as needed for anxiety. 03/14/20   Yes [provider]  irbesartan (AVAPRO) 75 MG tablet Take 1 tablet (75 mg total) by mouth daily. 01/02/21 12/28/21 Yes Wouk, Ailene Rud, MD  LAGEVRIO 200 MG CAPS capsule SMARTSIG:4 Capsule(s) By Mouth Every 12 Hours 03/11/21  Yes [provider]  levothyroxine (SYNTHROID) 75 MCG tablet Take 75 mcg by mouth daily before breakfast.   Yes [provider]  lidocaine (LIDODERM) 5 % Place 3 patches onto the skin daily. (Remove after 12 hours and keep off for 12 hours)   Yes [provider]  Melatonin 10 MG TABS Take 10 mg by mouth at bedtime as needed (sleep).   Yes [provider]  metFORMIN (GLUCOPHAGE) 500 MG tablet Take 500 mg by mouth 2 (two) times daily. 09/19/19  Yes [provider]  nystatin (MYCOSTATIN/NYSTOP) powder Apply topically 3 (three) times daily. 01/21/21  Yes Lorella Nimrod, MD  oxyCODONE-acetaminophen (PERCOCET) 10-325 MG tablet Take 1 tablet by mouth 5 (five) times daily as needed for pain.   Yes [provider]  pantoprazole (PROTONIX) 40 MG tablet Take 40 mg by mouth 2 (two) times daily. 11/11/20  Yes [provider]  sitaGLIPtin (JANUVIA) 100 MG tablet Take 100 mg by mouth daily.   Yes [provider]  Tiotropium Bromide-Olodaterol (STIOLTO RESPIMAT) 2.5-2.5 MCG/ACT AERS Inhale 2 puffs into the lungs daily. 01/23/21  Yes Tanda Rockers, MD  torsemide (DEMADEX) 20 MG tablet Take 1 tablet (20 mg total) by mouth daily. Alternating with 20 mg by mouth every other day Patient taking differently: Take 20-40 mg by mouth daily. Takes 20 mg qod and 40 mg other days 03/09/21  Yes Gollan, Kathlene November, MD  Vitamin D, Ergocalciferol, (DRISDOL) 1.25 MG (50000 UNIT) CAPS capsule Take 50,000 Units by mouth once a week. Mondays   Yes [provider]  MOVANTIK 25 MG TABS tablet Take 25 mg by mouth daily. Patient not taking: Reported on 05/13/2021 04/03/21   [provider]  nicotine (NICODERM CQ - DOSED IN  MG/24 HOURS) 21 mg/24hr patch Place 1 patch (21 mg total) onto the skin daily. Patient not taking: Reported on  05/10/2021 08/15/20   Marrianne Mood D, PA-C  Scheduled Meds:  propofol       chlorhexidine gluconate (MEDLINE KIT)  15 mL Mouth Rinse BID   Chlorhexidine Gluconate Cloth  6 each Topical Q0600   docusate  100 mg Per Tube BID   heparin  5,000 Units Subcutaneous Q8H   insulin aspart  0-15 Units Subcutaneous Q4H   ipratropium-albuterol  3 mL Nebulization Q6H   ketamine       mouth rinse  15 mL Mouth Rinse 10 times per day   polyethylene glycol  17 g Per Tube Daily   Continuous Infusions:  propofol Stopped (05/08/2021 0826)   famotidine (PEPCID) IV     propofol (DIPRIVAN) infusion 10 mcg/kg/min (04/18/2021 0825)   PRN Meds:.docusate, fentaNYL (SUBLIMAZE) injection, fentaNYL (SUBLIMAZE) injection, midazolam, midazolam, polyethylene glycol  Assessment & Plan:  Acute on chronic hypercapnic hypoxic respiratory failure secondary to acute CHF exacerbation and AECOPD complicated by OSA and Possible CAP Hx: Malignant neoplasm of right lung, asthma, and current everyday smoker  -continue ventilator support & lung protective strategies -Wean PEEP & FiO2 as tolerated, maintain SpO2 > 90% -Head of bed elevated 30 degrees, VAP protocol in place -Plateau pressures less than 30 cm H20  -Intermittent chest x-ray & ABG PRN -Follow cultures, strep pneumo, Legionella, trachael aspirate, Trend procal/Lactate -Start Ceftriaxone and Azithromycin pending cultures -Daily WUA with SBT per protocol -Ensure adequate pulmonary hygiene  -Budesonide inhaler nebs BID, bronchodilators PRNBronchodilators PRN -PAD protocol in place; wean sedation/analgesia for RASS goal 0  Paroxysmal atrial fibrillation previously on amiodarone and beta-blocker but these were d/c in the setting of presyncope, bradycardia, and hypotension on 8/22 -Continue Eliquis once able to take po -Start Heparin -Hold betablockers  and  Amiodarone in the setting of bradycardia -Cardiology consult   Chronic Diastolic HFpEF~(EF 50 to 40% via Echo 04/2021)  PMHx: CAD, Tachybradycardia syndrome, sinus pauses with history of syncope/SSS, Severe Aortic stenosis (followed by structural clinic not candidate for invasive procedures) -Continuous cardiac monitoring -Maintain MAP greater than 65 -Continue atorvastatin -Hold Irbesartan the setting of AKI -IV Lasix as blood pressure and renal function permits; currently on Torsemide -Cardiology consult   AKI on CKD-Stage III -Monitor I&O's / urinary output -Follow BMP -Ensure adequate renal perfusion -Avoid nephrotoxic agents as able -Replace electrolytes as indicated   Poorly Diabetes mellitus Diabetic Peripheral Neuropathy -CBGs -Sliding scale insulin -Follow ICU hyper/hypoglycemia protocol -Hold home Metformin & Januvia -Continue gabapentin for peripheral neuropathy -Diabetic coordinator consult  Squamous cell carcinoma right lower lobe (04/2020) status post SBRT -CT scan PET scan September 2022  imaging findings highly concerning for recurrent malignancy -Per Oncology last visit holding off biopsy given patient's tenuous respiratory status.   -Keytruda IV infusion every 3 weeks  -Follows with Dr. Rogue Bussing, will place consult for inpatient follow up  Hypothyroidism -Continue Synthroid   Best practice:  Diet:  NPO Pain/Anxiety/Delirium protocol (if indicated): Yes (RASS goal 0) VAP protocol (if indicated): Yes DVT prophylaxis: Systemic AC GI prophylaxis: PPI Glucose control:  SSI Yes Central venous access:  N/A Arterial line:  N/A Foley:  Yes, and it is still needed Mobility:  bed rest  PT consulted: N/A Last date of multidisciplinary goals of care discussion [12/4] Code Status:  full code Disposition: ICU   = Goals of Care = Code Status Order: '@CODE' @   Primary Emergency Contact: MASHBURN,JUDY A, Home Phone: [phone] Wishes to pursue full  aggressive treatment and intervention options, including CPR and intubation, but goals of  care will be addressed on going with family if that should become necessary.  Critical care time: 45 minutes     Rufina Falco, DNP, CCRN, FNP-C, AGACNP-BC Acute Care Nurse Practitioner  [facility] Pager: [phone] [place] [facility]  .

## 2021-04-20 ENCOUNTER — Inpatient Hospital Stay: Payer: Medicare Other | Attending: Internal Medicine

## 2021-04-20 ENCOUNTER — Inpatient Hospital Stay: Payer: Medicare Other | Admitting: Internal Medicine

## 2021-04-20 ENCOUNTER — Inpatient Hospital Stay: Payer: Medicare Other

## 2021-04-20 DIAGNOSIS — N179 Acute kidney failure, unspecified: Secondary | ICD-10-CM | POA: Diagnosis not present

## 2021-04-20 DIAGNOSIS — J9621 Acute and chronic respiratory failure with hypoxia: Secondary | ICD-10-CM | POA: Diagnosis not present

## 2021-04-20 DIAGNOSIS — I350 Nonrheumatic aortic (valve) stenosis: Secondary | ICD-10-CM

## 2021-04-20 DIAGNOSIS — J9602 Acute respiratory failure with hypercapnia: Secondary | ICD-10-CM | POA: Diagnosis not present

## 2021-04-20 DIAGNOSIS — J9601 Acute respiratory failure with hypoxia: Secondary | ICD-10-CM | POA: Diagnosis not present

## 2021-04-20 DIAGNOSIS — I5031 Acute diastolic (congestive) heart failure: Secondary | ICD-10-CM

## 2021-04-20 DIAGNOSIS — I480 Paroxysmal atrial fibrillation: Secondary | ICD-10-CM

## 2021-04-20 DIAGNOSIS — D6489 Other specified anemias: Secondary | ICD-10-CM

## 2021-04-20 LAB — CBC
HCT: 28.3 % — ABNORMAL LOW (ref 36.0–46.0)
Hemoglobin: 8.6 g/dL — ABNORMAL LOW (ref 12.0–15.0)
MCH: 27 pg (ref 26.0–34.0)
MCHC: 30.4 g/dL (ref 30.0–36.0)
MCV: 88.7 fL (ref 80.0–100.0)
Platelets: 251 10*3/uL (ref 150–400)
RBC: 3.19 MIL/uL — ABNORMAL LOW (ref 3.87–5.11)
RDW: 14 % (ref 11.5–15.5)
WBC: 8.1 10*3/uL (ref 4.0–10.5)
nRBC: 0 % (ref 0.0–0.2)

## 2021-04-20 LAB — HEMOGLOBIN A1C
Hgb A1c MFr Bld: 6 % — ABNORMAL HIGH (ref 4.8–5.6)
Mean Plasma Glucose: 126 mg/dL

## 2021-04-20 LAB — PROCALCITONIN: Procalcitonin: 0.53 ng/mL

## 2021-04-20 LAB — BASIC METABOLIC PANEL
Anion gap: 7 (ref 5–15)
BUN: 35 mg/dL — ABNORMAL HIGH (ref 8–23)
CO2: 33 mmol/L — ABNORMAL HIGH (ref 22–32)
Calcium: 8.9 mg/dL (ref 8.9–10.3)
Chloride: 103 mmol/L (ref 98–111)
Creatinine, Ser: 1.78 mg/dL — ABNORMAL HIGH (ref 0.44–1.00)
GFR, Estimated: 31 mL/min — ABNORMAL LOW (ref >60)
Glucose, Bld: 146 mg/dL — ABNORMAL HIGH (ref 70–99)
Potassium: 4.7 mmol/L (ref 3.5–5.1)
Sodium: 143 mmol/L (ref 135–145)

## 2021-04-20 LAB — TRIGLYCERIDES: Triglycerides: 145 mg/dL (ref <150)

## 2021-04-20 LAB — MAGNESIUM: Magnesium: 2.3 mg/dL (ref 1.7–2.4)

## 2021-04-20 LAB — GLUCOSE, CAPILLARY
Glucose-Capillary: 138 mg/dL — ABNORMAL HIGH (ref 70–99)
Glucose-Capillary: 143 mg/dL — ABNORMAL HIGH (ref 70–99)
Glucose-Capillary: 125 mg/dL — ABNORMAL HIGH (ref 70–99)
Glucose-Capillary: 120 mg/dL — ABNORMAL HIGH (ref 70–99)
Glucose-Capillary: 125 mg/dL — ABNORMAL HIGH (ref 70–99)

## 2021-04-20 LAB — PHOSPHORUS: Phosphorus: 4.3 mg/dL (ref 2.5–4.6)

## 2021-04-20 MED ORDER — GABAPENTIN 600 MG PO TABS
600.0000 mg | ORAL_TABLET | Freq: Two times a day (BID) | ORAL | Status: DC
  Filled 2021-04-20 (×2): qty 1

## 2021-04-20 MED ORDER — OXYCODONE-ACETAMINOPHEN 5-325 MG PO TABS: 1.0000 | ORAL_TABLET | Freq: Four times a day (QID) | ORAL | Status: DC | PRN

## 2021-04-20 MED ORDER — IPRATROPIUM-ALBUTEROL 0.5-2.5 (3) MG/3ML IN SOLN
3.0000 mL | RESPIRATORY_TRACT | Status: DC | PRN
  Administered 2021-04-20 (×2): 3 mL via RESPIRATORY_TRACT
  Filled 2021-04-20 (×2): qty 3

## 2021-04-20 MED ORDER — DEXMEDETOMIDINE HCL IN NACL 400 MCG/100ML IV SOLN
0.4000 ug/kg/h | INTRAVENOUS | Status: DC
  Administered 2021-04-20: 0.4 ug/kg/h via INTRAVENOUS
  Administered 2021-04-20: 0.5 ug/kg/h via INTRAVENOUS
  Administered 2021-04-20: 0.7 ug/kg/h via INTRAVENOUS
  Filled 2021-04-20 (×3): qty 100

## 2021-04-20 MED ORDER — AMIODARONE HCL 200 MG PO TABS
200.0000 mg | ORAL_TABLET | Freq: Every day | ORAL | Status: DC
  Administered 2021-04-20: 200 mg
  Filled 2021-04-20: qty 1

## 2021-04-20 MED ORDER — METHYLPREDNISOLONE SODIUM SUCC 40 MG IJ SOLR
40.0000 mg | INTRAMUSCULAR | Status: DC
  Administered 2021-04-21: 40 mg via INTRAVENOUS
  Filled 2021-04-20: qty 1

## 2021-04-20 MED ORDER — METHYLPREDNISOLONE SODIUM SUCC 40 MG IJ SOLR
40.0000 mg | Freq: Once | INTRAMUSCULAR | Status: AC
  Administered 2021-04-20: 40 mg via INTRAVENOUS
  Filled 2021-04-20: qty 1

## 2021-04-20 MED ORDER — ATORVASTATIN CALCIUM 20 MG PO TABS: 80.0000 mg | ORAL_TABLET | Freq: Every day | ORAL | Status: DC

## 2021-04-20 MED ORDER — AMIODARONE HCL 200 MG PO TABS: 200.0000 mg | ORAL_TABLET | Freq: Every day | ORAL | Status: DC

## 2021-04-20 MED ORDER — OXYCODONE HCL 5 MG PO TABS: 5.0000 mg | ORAL_TABLET | Freq: Four times a day (QID) | ORAL | Status: DC | PRN

## 2021-04-20 MED ORDER — EZETIMIBE 10 MG PO TABS
10.0000 mg | ORAL_TABLET | Freq: Every day | ORAL | Status: DC
  Filled 2021-04-20: qty 1

## 2021-04-20 MED ORDER — POLYETHYLENE GLYCOL 3350 17 G PO PACK: 17.0000 g | PACK | Freq: Every day | ORAL | Status: DC | PRN

## 2021-04-20 MED ORDER — DOCUSATE SODIUM 100 MG PO CAPS: 100.0000 mg | ORAL_CAPSULE | Freq: Two times a day (BID) | ORAL | Status: DC | PRN

## 2021-04-20 MED ORDER — APIXABAN 5 MG PO TABS: 5.0000 mg | ORAL_TABLET | Freq: Two times a day (BID) | ORAL | Status: DC

## 2021-04-20 MED ORDER — POLYETHYLENE GLYCOL 3350 17 G PO PACK: 17.0000 g | PACK | Freq: Every day | ORAL | Status: DC

## 2021-04-20 MED ORDER — LEVOTHYROXINE SODIUM 50 MCG PO TABS: 75.0000 ug | ORAL_TABLET | Freq: Every day | ORAL | Status: DC

## 2021-04-20 MED ORDER — DOCUSATE SODIUM 100 MG PO CAPS: 100.0000 mg | ORAL_CAPSULE | Freq: Two times a day (BID) | ORAL | Status: DC

## 2021-04-20 MED ORDER — HYDROXYZINE HCL 50 MG PO TABS
50.0000 mg | ORAL_TABLET | Freq: Four times a day (QID) | ORAL | Status: DC | PRN
  Filled 2021-04-20: qty 1

## 2021-04-20 MED ORDER — FUROSEMIDE 10 MG/ML IJ SOLN
60.0000 mg | Freq: Once | INTRAMUSCULAR | Status: AC
  Administered 2021-04-20: 60 mg via INTRAVENOUS
  Filled 2021-04-20: qty 6

## 2021-04-20 MED ORDER — FUROSEMIDE 10 MG/ML IJ SOLN
40.0000 mg | Freq: Once | INTRAMUSCULAR | Status: AC
  Administered 2021-04-20: 40 mg via INTRAVENOUS
  Filled 2021-04-20: qty 4

## 2021-04-20 NOTE — Progress Notes (Signed)
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  Per ED notes and EMS run sheet, patient was found with an oxygen saturation somewhere between the 50s and 70s, obviously tired but responsive. Pt placed on CPAP en route to the ER by EMS and treated with Duonebs and IV solumedrol. However in spite of starting her on CPAP and giving her Solu-Medrol and 1 DuoNeb breathing treatment, she became less responsive during transit.  Patient was intubated in the emergency department, placed on mechanical ventilation, PCCM was consulted for evaluation and help with management   Past Medical History    Acute respiratory failure ([place]) 05/11/2021   Primary cancer of right lower lobe of lung ([place]) 02/20/2021   COPD GOLD ? / active smoker 01/23/2021   Cigarette smoker 01/23/2021   Acute on chronic respiratory failure ([place]) 01/17/2021   Junctional rhythm 01/02/2021   Acute on chronic renal failure ([place]) 01/02/2021   Tachycardia-bradycardia syndrome ([place]) 01/02/2021   Weakness     Acute hypoxemic respiratory failure ([place]) 12/23/2020   Acute exacerbation of chronic obstructive pulmonary disease (COPD) ([place]) 12/22/2020   Aspiration into airway     PAF (paroxysmal atrial fibrillation) (HCC)     CAD (coronary artery disease) 09/26/2020   Hyperkalemia 09/26/2020   CHF exacerbation ([place]) 09/26/2020   Acute on chronic respiratory failure  with hypoxia and hypercapnia ([place]) 09/25/2020   Bradycardia 09/25/2020   Lung cancer ([place]) 05/26/2020   Atrial flutter with rapid ventricular response ([place]) 05/26/2020   Demand ischemia ([place])     Acute on chronic heart failure with preserved ejection fraction (HFpEF) ([place])     Aortic stenosis-moderate to severe 03/2020     Obesity, Class III, BMI 40-49.9 (morbid obesity) ([place]) 03/29/2020   Chronic respiratory failure with hypoxia ([place]) 03/29/2020   OSA (obstructive sleep apnea) 03/29/2020   Essential hypertension 03/29/2020   Hypothyroidism 03/29/2020   Chest pain 03/29/2020   Elevated troponin 03/29/2020   Chronic diastolic (congestive) heart failure ([place]) 07/12/2018   Type 2 diabetes mellitus without complication ([place]) 93/73/4287   COPD exacerbation ([place]) 07/12/2018   Tobacco use 07/12/2018   Hypotension 07/12/2018   Chronic respiratory failure with hypoxia and hypercapnia ([place]) 07/04/2018   Morbid obesity due to excess calories ([place]) 02/08/2016    Significant Hospital Events   12/4: Admitted to the ICU with acute on chronic hypoxic hypercapnic respiratory failure.  Consults:  PCCM  Procedures:  12/4> Intubation  Micro Data:  12/4: SARS-CoV-2 PCR> negative 12/4: Influenza PCR> negative 12/4: Blood culture x2> 12/4: Urine Culture> 12/4: MRSA PCR>>  12/4: Strep pneumo urinary antigen> 12/4: Legionella urinary antigen>  OBJECTIVE  Blood pressure (!) 126/59, pulse 76, temperature 97.9 F (36.6 C), temperature source Axillary, resp. rate 16, height 5\' 2"  (1.575 m), weight 110.1 kg, SpO2 96 %.    Vent Mode: PRVC FiO2 (%):  [28 %-40 %] 40 % Set Rate:  [16 bmp] 16 bmp Vt Set:  [500 mL] 500 mL PEEP:  [5 cmH20]  [place] Pressure:  [23 cmH20-25 cmH20] 23 cmH20   Intake/Output Summary (Last 24 hours) at 04/20/2021 6720 Last data filed at 04/20/2021 0800 Gross per 24 hour  Intake 1042.11 ml  Output 645 ml  Net 397.11 ml   Filed Weights   04/18/2021 0550 04/17/2021  0750 04/20/21 0500  Weight: 114 kg 105.4 kg 110.1 kg     Physical Examination    Physical exam: General: Crtitically ill-appearing morbidly obese female, orally intubated HEENT: [place]/AT, eyes anicteric.  ETT and OGT in place Neuro: Awake, following commands, moving all 4 extremities Chest: Bilateral expiratory wheezes, crackles at the bases bilaterally.  No rhonchi Heart: Regular rate and rhythm, no murmurs or gallops Abdomen: Soft, nontender, nondistended, bowel sounds present Skin: No rash  Labs   CBC: Recent Labs  Lab 04/18/2021 0608 04/20/21 0515  WBC 15.4* 8.1  NEUTROABS 10.9*  --   HGB 10.9* 8.6*  HCT 36.3 28.3*  MCV 91.9 88.7  PLT 305 947    Basic Metabolic Panel: Recent Labs  Lab 05/13/2021 0608 04/20/21 0515  NA 138 143  K 4.0 4.7  CL 94* 103  CO2 35* 33*  GLUCOSE 264* 146*  BUN 25* 35*  CREATININE 1.67* 1.78*  CALCIUM 9.0 8.9  MG  --  2.3  PHOS  --  4.3   GFR: Estimated Creatinine Clearance: 35.4 mL/min (A) (by C-G formula based on SCr of 1.78 mg/dL (H)). Recent Labs  Lab 04/21/2021 0608 04/20/21 0515  PROCALCITON <0.10 0.53  WBC 15.4* 8.1  LATICACIDVEN 1.9  --     Liver Function Tests: Recent Labs  Lab 05/02/2021 0608  AST 15  ALT 11  ALKPHOS 44  BILITOT 0.6  PROT 7.5  ALBUMIN 3.4*   No results for input(s): LIPASE, AMYLASE in the last 168 hours. No results for input(s): AMMONIA in the last 168 hours.  ABG    Component Value Date/Time   PHART 7.32 (L) 05/16/2021 0608   PCO2ART 67 (HH) 04/28/2021 0608   PO2ART 131 (H) 05/07/2021 0608   HCO3 34.5 (H) 04/26/2021 0608   O2SAT 98.8 04/18/2021 0608     Coagulation Profile: No results for input(s): INR, PROTIME in the last 168 hours.  Cardiac Enzymes: No results for input(s): CKTOTAL, CKMB, CKMBINDEX, TROPONINI in the last 168 hours.  HbA1C: Hgb A1c MFr Bld  Date/Time Value Ref Range Status  12/23/2020 05:09 AM 5.4 4.8 - 5.6 % Final    Comment:    (NOTE) Pre diabetes:           5.7%-6.4%  Diabetes:              >6.4%  Glycemic control for   <7.0% adults with diabetes   09/26/2020 01:26 AM 6.2 (H) 4.8 - 5.6 % Final    Comment:    (NOTE) Pre diabetes:          5.7%-6.4%  Diabetes:              >6.4%  Glycemic control for   <7.0% adults with diabetes     CBG: Recent Labs  Lab 05/14/2021 1552 05/10/2021 1953 04/20/2021 2340 04/20/21 0320 04/20/21 0736  GLUCAP 157* 162* 290* 138* 143*     Assessment & Plan:  Acute on chronic hypercapnic hypoxic respiratory failure  Acute on chronic diastolic CHF exacerbation  Acute COPD exacerbation  OSA  Community-acquired pneumonia with gram-positive cocci  Tobacco dependence Malignant neoplasm of right lung  Continue lung protective ventilation Wean PEEP &  FiO2 as tolerated, maintain SpO2 > 90% Peak and plateau pressures are at goal Vent setting was adjusted to clear hypercapnia Patient is tolerating pressure support trial Watch for respiratory distress She does have thin frothy secretions which is consistent with acute CHF exacerbation and pulmonary edema X-ray chest confirmed bilateral interstitial infiltrates We will give her Lasix 40 mg x 1, reassess volume status She may need more diuretics later in the day today Respiratory culture is growing gram-positive cocci Continue IV ceftriaxone and azithromycin Smoking cessation counseling when able to Outpatient follow-up with oncology Continue budesonide inhaler nebs BID, bronchodilators PRNBronchodilators PRN  Paroxysmal atrial fibrillation Patient remained in sinus rhythm Previously was on amiodarone and beta-blocker but these were d/c in the setting of presyncope, bradycardia, and hypotension on 8/22 Continue Eliquis for stroke prophylaxis   Severe Aortic stenosis (followed by structural clinic not candidate for invasive procedures) Continuous cardiac monitoring Maintain MAP greater than 65 Continue atorvastatin Hold Irbesartan the setting of  AKI Cardiology consulted   AKI on CKD-Stage IIIa Likely due to cardiorenal syndrome Monitor I&O's / urinary output Avoid nephrotoxic agents as able Replace electrolytes as indicated   Diabetes type 2, complicated with diabetic Peripheral Neuropathy Monitor fingerstick with goal 140-180 Continue sliding scale insulin Hold home Metformin & Januvia Continue gabapentin for peripheral neuropathy  Squamous cell carcinoma right lower lobe (04/2020) status post SBRT CT scan PET scan September 2022  imaging findings highly concerning for recurrent malignancy Per Oncology last visit holding off biopsy given patient's tenuous respiratory status.   On Keytruda IV infusion every 3 weeks  Outpatient follow-up with oncology  Hypothyroidism Continue Synthroid  Anemia of chronic disease Monitor H&H and transfuse if less than 8  Morbid obesity Dietitian follow-up   Best practice:  Diet:  NPO Pain/Anxiety/Delirium protocol (if indicated): Yes (RASS goal 0) VAP protocol (if indicated): Yes DVT prophylaxis: Systemic AC GI prophylaxis: PPI Glucose control:  SSI Yes Central venous access:  N/A Arterial line:  N/A Foley:  Yes, and it is still needed Mobility:  bed rest  PT consulted: N/A Last date of multidisciplinary goals of care discussion [12/4] Code Status:  full code Disposition: ICU    Primary Emergency Contact: MASHBURN,JUDY A, Home Phone: [phone] Wishes to pursue full aggressive treatment and intervention options, including CPR and intubation, but goals of care will be addressed on going with family if that should become necessary.  Critical care time:     Total critical care time: 46 minutes  Performed by: [place] care time was exclusive of separately billable procedures and treating other patients.   Critical care was necessary to treat or prevent imminent or life-threatening deterioration.   Critical care was time spent personally by me on the  following activities: development of treatment plan with patient and/or surrogate as well as nursing, discussions with consultants, evaluation of patient's response to treatment, examination of patient, obtaining history from patient or surrogate, ordering and performing treatments and interventions, ordering and review of laboratory studies, ordering and review of radiographic studies, pulse oximetry and re-evaluation of patient's condition.   Jacky Kindle MD [facility] See Amion for pager If no response to pager, please call [phone] until 7pm After 7pm, Please call E-link [phone]   .

## 2021-04-20 NOTE — Consult Note (Signed)
PHARMACY CONSULT NOTE  Pharmacy Consult for Electrolyte Monitoring and Replacement   Recent Labs: Potassium (mmol/L)  Date Value  04/20/2021 4.7   Magnesium (mg/dL)  Date Value  04/20/2021 2.3   Calcium (mg/dL)  Date Value  04/20/2021 8.9   Albumin (g/dL)  Date Value  05/05/2021 3.4 (L)   Phosphorus (mg/dL)  Date Value  04/20/2021 4.3   Sodium (mmol/L)  Date Value  04/20/2021 143   Assessment: Patient is a 70 y/o F with medical history including HFpEF, AS, Afib, HTN, chronic respiratory failure, tobacco abuse, COPD, OSA, hypothyroidism, diabetes, anxiety, GERD, lung cancer who is admitted with acute on chronic respiratory failure requiring intubation and mechanical ventilation. Pharmacy consulted to assist with electrolyte replacement as indicated.   Goal of Therapy:  Electrolytes within normal limits  Plan:  --No electrolyte replacement indicated at this time --Follow-up electrolytes with AM labs tomorrow  Benita Gutter 04/20/2021 8:24 AM

## 2021-04-20 NOTE — Progress Notes (Signed)
Pt extubated without complications, no stridor noted, placed on 6lpm [place], sats 95%.

## 2021-04-20 NOTE — Progress Notes (Signed)
Patient was successfully extubated, she was doing well on 2 L oxygen via nasal cannula, 30 minutes postextubation she started feeling difficulty breathing, started getting tired became hypoxic to low 80s.  She was placed on BiPAP with improvement in work of breathing, currently on FiO2 of 60% with IPAP of 12 and EPAP 6  She went into A. fib with RVR, now rate is getting controlled once her respiratory status is settled  She remains high risk of endotracheal intubation. Patient family was updated at bedside    Additional critical care time: 36 minutes  Performed by: Jacky Kindle   Critical care time was exclusive of separately billable procedures and treating other patients.   Critical care was necessary to treat or prevent imminent or life-threatening deterioration.   Critical care was time spent personally by me on the following activities: development of treatment plan with patient and/or surrogate as well as nursing, discussions with consultants, evaluation of patient's response to treatment, examination of patient, obtaining history from patient or surrogate, ordering and performing treatments and interventions, ordering and review of laboratory studies, ordering and review of radiographic studies, pulse oximetry and re-evaluation of patient's condition.   Jacky Kindle MD [facility] See Amion for pager If no response to pager, please call [phone] until 7pm After 7pm, Please call E-link [phone]

## 2021-04-20 NOTE — Consult Note (Signed)
Cardiology Consultation:   Patient ID: Kristin [name]; [id number]; 03-20-1951   Admit date: 05/16/2021 Date of Consult: 04/20/2021  Primary Care Provider: [facility] Primary Cardiologist: Rockey Situ Primary Electrophysiologist:  None   Patient Profile:   Kristin [name] is a 70 y.o. female with a hx of nonobstructive CAD, SCC of the RLL, severe aortic stenosis undergoing TAVR evaluation, atrial fib/flutter diagnosed in 05/2020, HFpEF, pSVT, Covid in 02/2021, chronic hypoxic respiratory failure on home oxygen, COPD, anemia of chronic disease, DM2, hypothyroidism, sleep apnea, and GERD  who is being seen today for the evaluation of severe aortic stenosis and history of atrial fib/flutter at the request of Ms. Rust-Chester, NP.  History of Present Illness:   Kristin [name] underwent prior echo in 06/2018 showed an EF of 60 to 65%, normal LV diastolic function parameters, normal RV systolic function and ventricular cavity size, mildly dilated left atrium, aortic valve with indeterminate number of cusps with severe calcification and moderate stenosis with a mean gradient of 26 mmHg and a valve area 1.39 cm.   She was admitted to the hospital in 03/2020 with chest pain and mild troponin elevation.  Echo showed an EF of 55 to 60%, no regional wall motion normalities, mild LVH, grade 1 diastolic dysfunction, normal RV systolic function and ventricular cavity size, mildly dilated left atrium, moderate to severe aortic stenosis with a valve area 1.11 cm and a mean gradient of 31 mmHg.  Diagnostic R/LHC on 03/31/2020 showed mild to moderate nonobstructive CAD with your stenosis being 60% of the proximal LCx.  Coronary arteries were overall moderately calcified.  RHC showed mildly elevated left sided filling pressures, moderate pulmonary hypertension, and moderately reduced cardiac output.  There was severe aortic stenosis with a mean gradient of 31 mmHg and calculated valve area 0.7 cm.  She was evaluated  by the structural heart team on 04/04/2020 and elected to undergo TAVR.  As part of this work-up she underwent CTA of the chest/aorta,/abdomen/pelvis/coronary arteries on 04/14/2020.  On this imaging, incidentally noted was a 1.9 x 1.4 x 1.6 cm mass in the superior segment of the right lower lobe suspicious for primary lung cancer.  Subsequent PET scan on 05/13/2020 showed a 2.1 x 1.4 cm right lower lobe pulmonary nodule that was hypermetabolic and compatible with malignancy with no appreciable nodal or metastatic spread.   She was admitted in 05/2020 with new onset atrial flutter with RVR.  She received 5 mg IV diltiazem and was placed on Cardizem and amiodarone drips.  She subsequently converted to a junctional rhythm with a 4.1-second posttermination pause.  Subsequent outpatient cardiac monitoring demonstrated a 1% Afib burden with the longest episode lasting 1 hour and 10 minutes along with 9 episodes of SVT.  Echo in 09/2020 showed an EF > 55%, no RWMA, mild LVH, normal RVSF with mildly enlarged cavity size, and a poorly visualized aortic valve.  Repeat echo in 10/2020 showed an EF of 55-60%, no RWMA, moderate LVH, mildly elevated PASP, mild biatrial enlargement, moderate to severe mitral annular calcification and severe aortic stenosis with a mean gradient of 29 mmHg and a valve area of 0.91 by VTI.  Repeat outpatient cardiac monitoring in 12/2020 showed 2 runs of NSVT with the longest episode lasting 12 beats, 531 episodes of SVT with the longest episode lasting 11 beats, 10% Afib burden, 19 pauses with the longest lasting 4.1 seconds, and junctional rhythm was noted. In this setting, she was evaluated by EP with  recommendation for conservative approach given her multiple comorbid conditions.   She has been evaluated by the structural heart team with most recent echo from 04/16/2021 demonstrating an EF of 50-55%, no RWMA, Gr2DD, moderately dilated left atrium, moderate mitral annular calcification, severe  aortic stenosis with a mean gradient of 40 mmHg, peak gradient 61 mmHg, and a valve area of 0.7 cm2. Plan is for her to follow up with the structural heart team.   She was admitted to [facility] on 12/4 with acute on chronic hypoxic respiratory failure secondary to acute on chronic hypoxic respiratory failure in the setting of acute COPD exacerbation, possible PNA, and HFpEF requiring mechanical ventilation. High sensitivity troponin negative x 2, BNP 245, HGB down to 8.6 this morning from 10.9 at admission yesterday with baseline around 12. Covid and influenza negative. ABG with pCO2 67. CXR showed cardiomegaly with pulmonary edema.  She has been started on azithromycin, Rocephin, steroids, nebs, and IV Lasix. She remains intubated, and is alert. No chest pain, palpitations, or worsening of baseline lower extremity swelling. She denies missing any doses of Lasix or Eliquis in the outpatient setting.  No hematochezia or melena.   Past Medical History:  Diagnosis Date   (HFpEF) heart failure with preserved ejection fraction ([place])    a. 09/2020 Echo: EF >55%; b. 10/2020 Echo: EF 55-60%. RVSP 68mHg.   Anxiety    Asthma    CAD (coronary artery disease)    a. 03/2020 Cath: LM nl, LAD 20, LCX 60p, 217m, RCA 30p, 4034mPDA 30-->Med Rx.   [facility]  COPD (chronic obstructive pulmonary disease) (HCC)    Diabetes mellitus without complication (HCC)    Dyspnea    GERD (gastroesophageal reflux disease)    History of hiatal hernia    History of orthopnea    History of radiation therapy 07/29/20-08/08/20   Right Lung, SBRT Dr. JamGery PrayHypothyroidism    Moderate aortic stenosis    a. 03/2020 Echo: Mod-sev AS. AVA 1.11cm^2. Mean grad 6m51m b. 10/2020 Echo: EF 55-60%, no rwma, nl RV fxn, RVSP 30mm34mmild BAE, mod-sev mitral annular Ca2+ w/o MS/MR. Mod-sev AS. AVA 0.91cm^2 (VTI), mean grad 29mmH36m Neuropathy    Oxygen deficiency    3L/HS   PAF (paroxysmal atrial fibrillation) (HCC)  [place].  Post-termination pauses. Prev on amio.  CHA2DS2VASc 6-->eliqis; b. 01/2021 Zio: Predominantly RSR w/ avg HR 81 (41-194). 2 runs NSVT vs AF w/ aberrancy (longest 12 beats @ 185). 531 SVT runs (longest 11 beats; fastest 194). 10% Afib burden (80-185 bpm, avg 118). Longest Afib 58m49s97m pauses - longest 4.1secs - jxnl rhythm present. Isolated PACs (1.6%).   Pain    BACK/DDD   Peripheral vascular disease (HCC)    RLS (restless legs syndrome)    Sleep apnea    Wheezing     Past Surgical History:  Procedure Laterality Date   BREAST SURGERY     CATARACT EXTRACTION W/PHACO Right 03/02/2018   Procedure: CATARACT EXTRACTION PHACO AND INTRAOCULAR LENS PLACEMENT (IOC);  [place]: Harrow,Marchia MeiersLocation[facility];  Service: Ophthalmology;  Laterality: Right;  US 01:1[place]oreaCDE 16.46 Fluid pack lot # [id number] EXTRACTION W/PHACO Left 04/27/2018   Procedure: CATARACT EXTRACTION PHACO AND INTRAOCULAR LENS PLACEMENT (IOC)- [facility];  Surgeon: Harrow,Marchia MeiersLocation: [facility];  Service: Ophthalmology;  Laterality: Left;  Lot # [id number]:[place].3 CDE: 8.07    CYST EXCISION  FOREHEAD   FOOT SURGERY     CYST   RIGHT/LEFT HEART CATH AND CORONARY ANGIOGRAPHY N/A 03/31/2020   Procedure: RIGHT/LEFT HEART CATH AND CORONARY ANGIOGRAPHY;  Surgeon: Wellington Hampshire, MD;  Location: [place] [facility];  Service: Cardiovascular;  Laterality: N/A;   TUBAL LIGATION       Home Meds: Prior to Admission medications   Medication Sig Start Date End Date Taking? Authorizing Provider  albuterol (PROVENTIL) (2.5 MG/3ML) 0.083% nebulizer solution Take 3 mLs (2.5 mg total) by nebulization every 4 (four) hours as needed for wheezing or shortness of breath. 01/23/21  Yes Tanda Rockers, MD  albuterol (VENTOLIN HFA) 108 (90 Base) MCG/ACT inhaler Inhale 2 puffs into the lungs every 4 (four) hours as needed for wheezing or shortness of breath.    Yes [provider]  apixaban (ELIQUIS) 5 MG  TABS tablet Take 1 tablet (5 mg total) by mouth 2 (two) times daily. 11/03/20  Yes Minna Merritts, MD  atorvastatin (LIPITOR) 80 MG tablet Take 1 tablet (80 mg total) by mouth at bedtime. 11/03/20  Yes Minna Merritts, MD  citalopram (CELEXA) 40 MG tablet Take 40 mg by mouth daily. 06/14/20  Yes [provider]  diltiazem (CARDIZEM) 30 MG tablet Take 1 tablet (30 mg total) by mouth 3 (three) times daily as needed. For A-fib breakthrough 03/09/21  Yes Gollan, Kathlene November, MD  docusate sodium (COLACE) 100 MG capsule Take 100 mg by mouth at bedtime as needed for mild constipation or moderate constipation.   Yes [provider]  ezetimibe (ZETIA) 10 MG tablet Take 10 mg by mouth daily. 02/18/21  Yes [provider]  famotidine (PEPCID) 40 MG tablet Take 40 mg by mouth at bedtime.   Yes [provider]  fenofibrate 160 MG tablet Take 160 mg by mouth daily.   Yes [provider]  ferrous sulfate 325 (65 FE) MG tablet Take 325 mg by mouth daily. 06/14/20  Yes [provider]  gabapentin (NEURONTIN) 600 MG tablet Take 600 mg by mouth 2 (two) times daily.  11/20/19  Yes [provider]  hydrOXYzine (VISTARIL) 50 MG capsule Take 50 mg by mouth every 6 (six) hours as needed for anxiety. 03/14/20  Yes [provider]  irbesartan (AVAPRO) 75 MG tablet Take 1 tablet (75 mg total) by mouth daily. 01/02/21 12/28/21 Yes Wouk, Ailene Rud, MD  LAGEVRIO 200 MG CAPS capsule SMARTSIG:4 Capsule(s) By Mouth Every 12 Hours 03/11/21  Yes [provider]  levothyroxine (SYNTHROID) 75 MCG tablet Take 75 mcg by mouth daily before breakfast.   Yes [provider]  lidocaine (LIDODERM) 5 % Place 3 patches onto the skin daily. (Remove after 12 hours and keep off for 12 hours)   Yes [provider]  Melatonin 10 MG TABS Take 10 mg by mouth at bedtime as needed (sleep).   Yes [provider]  metFORMIN (GLUCOPHAGE) 500 MG tablet  Take 500 mg by mouth 2 (two) times daily. 09/19/19  Yes [provider]  nystatin (MYCOSTATIN/NYSTOP) powder Apply topically 3 (three) times daily. 01/21/21  Yes Lorella Nimrod, MD  oxyCODONE-acetaminophen (PERCOCET) 10-325 MG tablet Take 1 tablet by mouth 5 (five) times daily as needed for pain.   Yes [provider]  pantoprazole (PROTONIX) 40 MG tablet Take 40 mg by mouth 2 (two) times daily. 11/11/20  Yes [provider]  sitaGLIPtin (JANUVIA) 100 MG tablet Take 100 mg by mouth daily.   Yes [provider]  Tiotropium Bromide-Olodaterol (STIOLTO RESPIMAT) 2.5-2.5 MCG/ACT AERS Inhale 2 puffs into the lungs daily. 01/23/21  Yes Tanda Rockers, MD  torsemide (DEMADEX) 20 MG tablet Take 1 tablet (20 mg total) by mouth daily. Alternating with 20 mg by mouth every other day Patient taking differently: Take 20-40 mg by mouth daily. Takes 20 mg qod and 40 mg other days 03/09/21  Yes Gollan, Kathlene November, MD  Vitamin D, Ergocalciferol, (DRISDOL) 1.25 MG (50000 UNIT) CAPS capsule Take 50,000 Units by mouth once a week. Mondays   Yes [provider]  MOVANTIK 25 MG TABS tablet Take 25 mg by mouth daily. Patient not taking: Reported on 05/02/2021 04/03/21   [provider]  nicotine (NICODERM CQ - DOSED IN MG/24 HOURS) 21 mg/24hr patch Place 1 patch (21 mg total) onto the skin daily. Patient not taking: Reported on 04/25/2021 08/15/20   Arvil Chaco, PA-C    Inpatient Medications: Scheduled Meds:  apixaban  5 mg Per Tube BID   atorvastatin  80 mg Per Tube QHS   budesonide (PULMICORT) nebulizer solution  0.25 mg Nebulization BID   chlorhexidine gluconate (MEDLINE KIT)  15 mL Mouth Rinse BID   Chlorhexidine Gluconate Cloth  6 each Topical Q0600   docusate  100 mg Per Tube BID   ezetimibe  10 mg Per Tube Daily   gabapentin  600 mg Per Tube BID   insulin aspart  0-15 Units Subcutaneous Q4H   ipratropium-albuterol  3 mL Nebulization Q6H   levothyroxine  75  mcg Per Tube QAC breakfast   mouth rinse  15 mL Mouth Rinse 10 times per day   methylPREDNISolone (SOLU-MEDROL) injection  40 mg Intravenous Q12H   polyethylene glycol  17 g Per Tube Daily   Continuous Infusions:  azithromycin Stopped (04/20/2021 1416)   cefTRIAXone (ROCEPHIN)  IV Stopped (05/06/2021 1245)   famotidine (PEPCID) IV Stopped (05/12/2021 2209)   fentaNYL infusion INTRAVENOUS 100 mcg/hr (04/20/21 0110)   propofol (DIPRIVAN) infusion 15 mcg/kg/min (04/20/21 0316)   PRN Meds: docusate, fentaNYL (SUBLIMAZE) injection, fentaNYL (SUBLIMAZE) injection, hydrOXYzine, ipratropium-albuterol, midazolam, midazolam, oxyCODONE-acetaminophen **AND** oxyCODONE, polyethylene glycol  Allergies:   Allergies  Allergen Reactions   Altace [Ramipril] Swelling    Social History:   Social History   Socioeconomic History   Marital status: Widowed    Spouse name: Not on file   Number of children: 4   Years of education: Not on file   Highest education level: Not on file  Occupational History   Occupation: Health and safety inspector  Tobacco Use   Smoking status: Every Day    Packs/day: 1.00    Years: 50.00    Pack years: 50.00    Types: Cigarettes   Smokeless tobacco: Never   Tobacco comments:    4-5 cigs per day 01/23/2021  Vaping Use   Vaping Use: Never used  Substance and Sexual Activity   Alcohol use: Not Currently   Drug use: Never   Sexual activity: Never    Birth control/protection: None  Other Topics Concern   Not on file  Social History Narrative   [place]; [name]- smokes 4-5 cig/day; no alcohol. Used to work in [employer]. 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    Social Determinants of Health   Financial Resource Strain: Not on file  Food Insecurity: Not on file  Transportation Needs: Not on file  Physical Activity: Not on file  Stress: Not on file  Social Connections: Not on file  Intimate Partner Violence: Not on file     Family History:   Family History  Problem  Relation Age of Onset   Heart disease Mother    Cancer Father     ROS:  Review of Systems  Constitutional:  Positive for malaise/fatigue. Negative for chills, diaphoresis, fever and weight loss.  HENT:  Negative for congestion.   Eyes:  Negative for discharge and redness.  Respiratory:  Positive for cough, shortness of breath and wheezing. Negative for sputum production.   Cardiovascular:  Positive for leg swelling. Negative for chest pain, palpitations, orthopnea, claudication and PND.  Gastrointestinal:  Negative for abdominal pain, blood in stool, heartburn, melena, nausea and vomiting.  Musculoskeletal:  Negative for falls and myalgias.  Skin:  Negative for rash.  Neurological:  Positive for weakness. Negative for dizziness, tingling, tremors, sensory change, speech change, focal weakness and loss of consciousness.  Endo/Heme/Allergies:  Does not bruise/bleed easily.  Psychiatric/Behavioral:  Negative for substance abuse. The patient is not nervous/anxious.   All other systems reviewed and are negative.    Physical Exam/Data:   Vitals:   04/20/21 0300 04/20/21 0400 04/20/21 0500 04/20/21 0600  BP: (!) 114/57 (!) 107/47 (!) 114/49 (!) 105/43  Pulse: 64 (!) 59 (!) 57 (!) 57  Resp: '15 16 16 16  ' Temp:  98.7 F (37.1 C)    TempSrc:  Oral    SpO2: 96% 96% 97% 96%  Weight:   110.1 kg   Height:        Intake/Output Summary (Last 24 hours) at 04/20/2021 0751 Last data filed at 04/20/2021 0600 Gross per 24 hour  Intake 909.79 ml  Output 645 ml  Net 264.79 ml   Filed Weights   04/23/2021 0550 05/16/2021 0750 04/20/21 0500  Weight: 114 kg 105.4 kg 110.1 kg   Body mass index is 44.4 kg/m.   Physical Exam: General: Well developed, well nourished, chronically ill appearing, in no acute distress. Head: Normocephalic, atraumatic, sclera non-icteric, no xanthomas, nares without discharge.  Neck: Negative for carotid bruits. JVD difficult to assess secondary to body habitus and  respiratory support apparatus.  Lungs: Vented breath sounds bilaterally. Breathing is unlabored. Heart: RRR with S1 S2. III/VI systolic murmur RUSB, no rubs, or gallops appreciated. Abdomen: Soft, non-tender, non-distended with normoactive bowel sounds. No hepatomegaly. No rebound/guarding. No obvious abdominal masses. Msk:  Strength and tone appear normal for age. Extremities: No clubbing or cyanosis. No edema. Distal pedal pulses are 2+ and equal bilaterally. Neuro: Intubated, mittens in place. Psych:  Intubated with normal affect.   EKG:  The EKG was personally reviewed and demonstrates: NSR, wit rare PVCs in a pattern of ventricular bigeminy, baseline wandering and artifact, tracing overall poor quality, low voltage along the precordial leads, nonspecific st/t changes Telemetry:  Telemetry was personally reviewed and demonstrates: SR with PACs and PVCs, no evidence of atrial flutter   Weights: Filed Weights   05/03/2021 0550 05/03/2021 0750 04/20/21 0500  Weight: 114 kg 105.4 kg 110.1 kg    Relevant CV Studies:  2D echo 04/16/2021: 1. Left ventricular ejection fraction, by estimation, is 50 to 55%. The  left ventricle has low normal function. The left ventricle has no regional  wall motion abnormalities. Left ventricular diastolic parameters are  consistent with Grade II diastolic  dysfunction (pseudonormalization).   2. Right ventricular systolic function is low normal. The right  ventricular size is not well visualized.   3. Left atrial size was moderately dilated.   4. The  mitral valve is degenerative. No evidence of mitral valve  regurgitation. Moderate mitral annular calcification.   5. Aortic valve mean gradient = 86mHg, peak gradient 63mg, Vmax  3.30m1030m DVI= 0.23, AVA 0.7cm2. . The aortic valve is calcified. Aortic  valve regurgitation is not visualized. Severe aortic valve stenosis.   6. The inferior vena cava is normal in size with greater than 50%  respiratory  variability, suggesting right atrial pressure of 3 mmHg.   Conclusion(s)/Recommendation(s): Findings consistent with severe valvular  heart disease.  __________  [facility]   Left Main  Vessel is angiographically normal.  Left Anterior Descending  Mid LAD lesion is 20% stenosed. The lesion is mildly calcified.  Left Circumflex  Prox Cx lesion is 60% stenosed. The lesion is moderately calcified.  Mid Cx to Dist Cx lesion is 20% stenosed.  Second Obtuse Marginal Branch  Vessel is angiographically normal.  Third Obtuse Marginal Branch  Vessel is angiographically normal.  Right Coronary Artery  Prox RCA lesion is 30% stenosed. The lesion is mildly calcified.  Mid RCA lesion is 40% stenosed. The lesion is mildly calcified.  Right Posterior Descending Artery  RPDA lesion is 30% stenosed.    Laboratory Data:  Chemistry Recent Labs  Lab 04/20/2021 0608 04/20/21 0515  NA 138 143  K 4.0 4.7  CL 94* 103  CO2 35* 33*  GLUCOSE 264* 146*  BUN 25* 35*  CREATININE 1.67* 1.78*  CALCIUM 9.0 8.9  GFRNONAA 33* 31*  ANIONGAP 9 7    Recent Labs  Lab 05/02/2021 0608  PROT 7.5  ALBUMIN 3.4*  AST 15  ALT 11  ALKPHOS 44  BILITOT 0.6   Hematology Recent Labs  Lab 04/29/2021 0608 04/20/21 0515  WBC 15.4* 8.1  RBC 3.95 3.19*  HGB 10.9* 8.6*  HCT 36.3 28.3*  MCV 91.9 88.7  MCH 27.6 27.0  MCHC 30.0 30.4  RDW 13.7 14.0  PLT 305 251   Cardiac EnzymesNo results for input(s): TROPONINI in the last 168 hours. No results for input(s): TROPIPOC in the last 168 hours.  BNP Recent Labs  Lab 04/26/2021 0608  BNP 245.2*    DDimer No results for input(s): DDIMER in the last 168 hours.  Radiology/Studies:  DG Chest 1 View  Result Date: 04/22/2021 IMPRESSION: No substantial interval change. Cardiomegaly with interstitial pulmonary edema pattern. Electronically Signed   By: EriMisty StanleyD.   On: 05/15/2021 06:31   DG Abd 1 View  Result Date: 05/01/2021 IMPRESSION: Slight  withdrawal of the gastric catheter although the tip projects in the expected region of the proximal duodenum. Electronically Signed   By: MarInez CatalinaD.   On: 05/14/2021 23:11   DG Abd 1 View  Result Date: 05/10/2021 IMPRESSION: Orogastric tube side port in the duodenum. Electronically Signed   By: KevUlyses JarredD.   On: 05/05/2021 21:01   DG Abdomen 1 View  Result Date: 05/10/2021 IMPRESSION: Enteric tube tip and side hole at or near the GEJ. Recommend advancing 5 more centimeters to ensure side hole placement within the stomach. Electronically Signed   By: H  Genevie AnnD.   On: 05/07/2021 07:12   Assessment and Plan:   1. Acute on chronic hypoxic respiratory failure: -Appears to be in the setting oc acute COPD exacberbation with possible PNA and HFpEF with morbid obesity OSA and possible OHS along with worsening anemia -Tracheal aspirate with GPC on Gram stain  -Mechanical ventilation per CCM -Supportive care  2. Severe aortic stenosis: -Recent  echo on 04/16/2021 as outlined above -Follow up with the structural heart team after she improves from her acute illness  3. PAF/flutter/pSVT: -Maintaining sinus rhythm -High risk for recurrent atrial arrhythmia in the context of her acute respiratory illness -Previously evaluated by EP for PAF/SVT in the context with pauses of up to 4.1 seconds -PTA amiodarone and prn diltiazem can be continued  -CHADS2VASc at least 6 (CHF, HTN, age x 1, DM, vascular disease, sex category) -PTA Eliquis, may need to hold given anemia, will discuss with MD -Tele  4. Nonobstructive CAD: -High sensitivity troponin negative x 2 -Eliquis in place of ASA to minimize bleeding risk -PTA Lipitor and Zetia  5. HFpEF: -Volume status is difficult on exam secondary to body habitus  -IV Lasix 40 mg daily, may need to titrate pending UOP -Strict I/O -Recent echo 04/16/2021, no indication to repeat at this time -Differ addition of MRA/SGLT2i at this time given  underlying CKD and acute illness   6. SCC of the RLL with ongoing tobacco use: -Per primary service and oncology   7. CKD stage III: -Renal function appears consistent with prior readings -Monitor with IV Lasix  8. Anemia: -Possibly of chronic disease -HGB has trended from 12 in 01/2021 down to 8.6 currently and is down from 10.9 at admission yesterday -May need to hold Eliquis if HGB continues down trend -Workup per primary service       For questions or updates, please contact [facility] Please consult [email or web address] for contact info under Cardiology/STEMI.   Signed, Christell Faith, PA-C [place] Pager: [phone] 04/20/2021, 7:51 AM

## 2021-04-20 NOTE — Progress Notes (Addendum)
Nutrition Follow-up  DOCUMENTATION CODES:   Morbid obesity  INTERVENTION:   -RD will follow for diet advancement and add supplements as appropriate  NUTRITION DIAGNOSIS:   Inadequate oral intake related to inability to eat (pt sedated and ventilated) as evidenced by NPO status.  Ongoing  GOAL:   Patient will meet greater than or equal to 90% of their needs  Progressing   MONITOR:   PO intake, Supplement acceptance, Diet advancement, Labs, Weight trends, Skin  REASON FOR ASSESSMENT:   Ventilator    ASSESSMENT:   70 y.o. 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 fib on Eliquis who presented to [facility] ED from home due to acute respiratory distress.  12/5- extubated  Reviewed I/O's: +265 ml x 24 hours  UOP: 645 ml x 24 hours   Case discussed with MD, RN, and during ICU rounds.   Pt is currently on bi-pap. RD unable to arouse to obtain history. No family/ supports at bedside. She is currently NPO.   Palliative care consulted for goals of care discussions.   Medications reviewed and include solu-medrol and miralax.   Labs reviewed: CBGS: [id number] (inpatient orders for glycemic control are 0-15 units insulin aspart every 4 hours).    NUTRITION - FOCUSED PHYSICAL EXAM:  Flowsheet Row Most Recent Value  Orbital Region No depletion  Upper Arm Region No depletion  Thoracic and Lumbar Region No depletion  Buccal Region No depletion  Temple Region No depletion  Clavicle and Acromion Bone Region No depletion  Scapular Bone Region No depletion  Dorsal Hand No depletion  Patellar Region No depletion   Anterior Thigh Region No depletion  Posterior Calf Region No depletion  Edema (RD Assessment) Mild  Hair Reviewed  Eyes Reviewed  Mouth Reviewed  Skin Reviewed  Nails Reviewed       Diet Order:   Diet Order             Diet NPO time specified  Diet effective now                   EDUCATION NEEDS:   No education needs have been identified at this time  Skin:  Skin Assessment: Reviewed RN Assessment  Last BM:  04/18/21  Height:   Ht Readings from Last 1 Encounters:  04/26/2021 5\' 2"  (1.575 m)    Weight:   Wt Readings from Last 1 Encounters:  04/20/21 110.1 kg    Ideal Body Weight:  50 kg  BMI:  Body mass index is 44.4 kg/m.  Estimated Nutritional Needs:   Kcal:  8366-2947  Protein:  100-115 grams  Fluid:  > 1.7 L    Loistine Chance, RD, LDN, [place] Registered Dietitian II Certified Diabetes Care and Education Specialist Please refer to [facility] for RD and/or RD on-call/weekend/after hours pager

## 2021-04-20 NOTE — Progress Notes (Signed)
0917 patient extubated to [place] patient states she is having trouble breathing HR flipped back to AFIB Resp, HR and BP all increased BIPAP orders placed and BIPAP placed on patient family at bedside

## 2021-04-21 ENCOUNTER — Inpatient Hospital Stay: Payer: Medicare Other

## 2021-04-21 ENCOUNTER — Encounter: Payer: Self-pay | Admitting: Internal Medicine

## 2021-04-21 DIAGNOSIS — J9601 Acute respiratory failure with hypoxia: Secondary | ICD-10-CM | POA: Diagnosis not present

## 2021-04-21 DIAGNOSIS — Z515 Encounter for palliative care: Secondary | ICD-10-CM

## 2021-04-21 DIAGNOSIS — N179 Acute kidney failure, unspecified: Secondary | ICD-10-CM | POA: Diagnosis not present

## 2021-04-21 DIAGNOSIS — I5031 Acute diastolic (congestive) heart failure: Secondary | ICD-10-CM | POA: Diagnosis not present

## 2021-04-21 DIAGNOSIS — G9341 Metabolic encephalopathy: Secondary | ICD-10-CM | POA: Diagnosis not present

## 2021-04-21 DIAGNOSIS — Z7189 Other specified counseling: Secondary | ICD-10-CM

## 2021-04-21 DIAGNOSIS — J9602 Acute respiratory failure with hypercapnia: Secondary | ICD-10-CM | POA: Diagnosis not present

## 2021-04-21 LAB — BLOOD GAS, ARTERIAL
Acid-Base Excess: 10.1 mmol/L — ABNORMAL HIGH (ref 0.0–2.0)
Acid-Base Excess: 6.9 mmol/L — ABNORMAL HIGH (ref 0.0–2.0)
Acid-Base Excess: 10.3 mmol/L — ABNORMAL HIGH (ref 0.0–2.0)
Bicarbonate: 37.3 mmol/L — ABNORMAL HIGH (ref 20.0–28.0)
Bicarbonate: 35.7 mmol/L — ABNORMAL HIGH (ref 20.0–28.0)
Bicarbonate: 37.9 mmol/L — ABNORMAL HIGH (ref 20.0–28.0)
Delivery systems: POSITIVE
Expiratory PAP: 5
FIO2: 30
FIO2: 45
FIO2: 0.6
Inspiratory PAP: 10
MECHVT: 500 mL
MECHVT: 400 mL
Mechanical Rate: 24
O2 Saturation: 93.8 %
O2 Saturation: 96.7 %
O2 Saturation: 99.7 %
PEEP: 5 cmH2O
PEEP: 10 cmH2O
Patient temperature: 37
Patient temperature: 37
Patient temperature: 37
RATE: 16 resp/min
RATE: 24 resp/min
pCO2 arterial: 66 mmHg (ref 32.0–48.0)
pCO2 arterial: 76 mmHg (ref 32.0–48.0)
pCO2 arterial: 67 mmHg (ref 32.0–48.0)
pH, Arterial: 7.36 (ref 7.350–7.450)
pH, Arterial: 7.28 — ABNORMAL LOW (ref 7.350–7.450)
pH, Arterial: 7.36 (ref 7.350–7.450)
pO2, Arterial: 73 mmHg — ABNORMAL LOW (ref 83.0–108.0)
pO2, Arterial: 98 mmHg (ref 83.0–108.0)
pO2, Arterial: 196 mmHg — ABNORMAL HIGH (ref 83.0–108.0)

## 2021-04-21 LAB — GLUCOSE, CAPILLARY
Glucose-Capillary: 114 mg/dL — ABNORMAL HIGH (ref 70–99)
Glucose-Capillary: 128 mg/dL — ABNORMAL HIGH (ref 70–99)
Glucose-Capillary: 142 mg/dL — ABNORMAL HIGH (ref 70–99)
Glucose-Capillary: 148 mg/dL — ABNORMAL HIGH (ref 70–99)
Glucose-Capillary: 150 mg/dL — ABNORMAL HIGH (ref 70–99)
Glucose-Capillary: 165 mg/dL — ABNORMAL HIGH (ref 70–99)
Glucose-Capillary: 171 mg/dL — ABNORMAL HIGH (ref 70–99)

## 2021-04-21 LAB — BASIC METABOLIC PANEL
Anion gap: 10 (ref 5–15)
BUN: 53 mg/dL — ABNORMAL HIGH (ref 8–23)
CO2: 32 mmol/L (ref 22–32)
Calcium: 8.9 mg/dL (ref 8.9–10.3)
Chloride: 100 mmol/L (ref 98–111)
Creatinine, Ser: 2.19 mg/dL — ABNORMAL HIGH (ref 0.44–1.00)
GFR, Estimated: 24 mL/min — ABNORMAL LOW (ref >60)
Glucose, Bld: 157 mg/dL — ABNORMAL HIGH (ref 70–99)
Potassium: 4.7 mmol/L (ref 3.5–5.1)
Sodium: 142 mmol/L (ref 135–145)

## 2021-04-21 LAB — LEGIONELLA PNEUMOPHILA SEROGP 1 UR AG: L. pneumophila Serogp 1 Ur Ag: NEGATIVE

## 2021-04-21 LAB — MAGNESIUM: Magnesium: 2.4 mg/dL (ref 1.7–2.4)

## 2021-04-21 LAB — PROCALCITONIN: Procalcitonin: 0.43 ng/mL

## 2021-04-21 LAB — PHOSPHORUS: Phosphorus: 6 mg/dL — ABNORMAL HIGH (ref 2.5–4.6)

## 2021-04-21 MED ORDER — LORAZEPAM 2 MG/ML IJ SOLN
2.0000 mg | Freq: Once | INTRAMUSCULAR | Status: AC
Start: 2021-04-21 — End: 2021-04-21
  Administered 2021-04-21: 2 mg via INTRAVENOUS
  Filled 2021-04-21: qty 1

## 2021-04-21 MED ORDER — DOCUSATE SODIUM 50 MG/5ML PO LIQD: 100.0000 mg | Freq: Two times a day (BID) | ORAL | Status: DC | PRN

## 2021-04-21 MED ORDER — DEXMEDETOMIDINE HCL IN NACL 400 MCG/100ML IV SOLN
0.4000 ug/kg/h | INTRAVENOUS | Status: DC
  Administered 2021-04-21: 1 ug/kg/h via INTRAVENOUS
  Administered 2021-04-21: 1.2 ug/kg/h via INTRAVENOUS
  Administered 2021-04-21: 0.6 ug/kg/h via INTRAVENOUS
  Administered 2021-04-21 (×2): 1 ug/kg/h via INTRAVENOUS
  Administered 2021-04-21 (×2): 1.2 ug/kg/h via INTRAVENOUS
  Administered 2021-04-21: 0.5 ug/kg/h via INTRAVENOUS
  Administered 2021-04-22 (×6): 1.2 ug/kg/h via INTRAVENOUS
  Administered 2021-04-22: 1 ug/kg/h via INTRAVENOUS
  Filled 2021-04-21 (×14): qty 100

## 2021-04-21 MED ORDER — GABAPENTIN 600 MG PO TABS
300.0000 mg | ORAL_TABLET | Freq: Two times a day (BID) | ORAL | Status: DC
  Administered 2021-04-21 – 2021-04-25 (×9): 300 mg
  Filled 2021-04-21 (×9): qty 0.5

## 2021-04-21 MED ORDER — ROCURONIUM BROMIDE 10 MG/ML (PF) SYRINGE
PREFILLED_SYRINGE | INTRAVENOUS | Status: AC
  Administered 2021-04-21: 50 mg
  Filled 2021-04-21: qty 10

## 2021-04-21 MED ORDER — POLYETHYLENE GLYCOL 3350 17 G PO PACK: 17.0000 g | PACK | Freq: Every day | ORAL | Status: DC | PRN

## 2021-04-21 MED ORDER — AMIODARONE HCL 200 MG PO TABS
200.0000 mg | ORAL_TABLET | Freq: Every day | ORAL | Status: DC
Start: 2021-04-21 — End: 2021-04-24
  Administered 2021-04-21 – 2021-04-23 (×3): 200 mg
  Filled 2021-04-21 (×3): qty 1

## 2021-04-21 MED ORDER — ADULT MULTIVITAMIN LIQUID CH
15.0000 mL | Freq: Every day | ORAL | Status: DC
  Administered 2021-04-22 – 2021-04-27 (×5): 15 mL
  Filled 2021-04-21 (×7): qty 15

## 2021-04-21 MED ORDER — POLYETHYLENE GLYCOL 3350 17 G PO PACK
17.0000 g | PACK | Freq: Every day | ORAL | Status: DC
  Administered 2021-04-21 – 2021-04-29 (×8): 17 g
  Filled 2021-04-21 (×8): qty 1

## 2021-04-21 MED ORDER — DOCUSATE SODIUM 50 MG/5ML PO LIQD
100.0000 mg | Freq: Two times a day (BID) | ORAL | Status: DC
  Filled 2021-04-21: qty 10

## 2021-04-21 MED ORDER — FREE WATER
30.0000 mL | Status: DC
  Administered 2021-04-21 – 2021-04-25 (×22): 30 mL

## 2021-04-21 MED ORDER — REVEFENACIN 175 MCG/3ML IN SOLN
175.0000 ug | Freq: Every day | RESPIRATORY_TRACT | Status: DC
Start: 2021-04-21 — End: 2021-04-29
  Administered 2021-04-22 – 2021-04-29 (×8): 175 ug via RESPIRATORY_TRACT
  Filled 2021-04-21 (×9): qty 3

## 2021-04-21 MED ORDER — ARFORMOTEROL TARTRATE 15 MCG/2ML IN NEBU
15.0000 ug | INHALATION_SOLUTION | Freq: Two times a day (BID) | RESPIRATORY_TRACT | Status: DC
  Administered 2021-04-21 – 2021-04-29 (×16): 15 ug via RESPIRATORY_TRACT
  Filled 2021-04-21 (×18): qty 2

## 2021-04-21 MED ORDER — FENTANYL CITRATE PF 50 MCG/ML IJ SOSY
PREFILLED_SYRINGE | INTRAMUSCULAR | Status: AC
  Administered 2021-04-21: 50 ug
  Filled 2021-04-21: qty 2

## 2021-04-21 MED ORDER — LEVOTHYROXINE SODIUM 50 MCG PO TABS
75.0000 ug | ORAL_TABLET | Freq: Every day | ORAL | Status: DC
Start: 2021-04-21 — End: 2021-04-29
  Administered 2021-04-21 – 2021-04-29 (×9): 75 ug
  Filled 2021-04-21 (×9): qty 2

## 2021-04-21 MED ORDER — VITAL HIGH PROTEIN PO LIQD
1000.0000 mL | ORAL | Status: DC
  Administered 2021-04-21 – 2021-04-22 (×2): 1000 mL

## 2021-04-21 MED ORDER — ATORVASTATIN CALCIUM 20 MG PO TABS
80.0000 mg | ORAL_TABLET | Freq: Every day | ORAL | Status: DC
Start: 2021-04-21 — End: 2021-04-29
  Administered 2021-04-21 – 2021-04-28 (×8): 80 mg
  Filled 2021-04-21 (×8): qty 4

## 2021-04-21 MED ORDER — ALBUTEROL (5 MG/ML) CONTINUOUS INHALATION SOLN
10.0000 mg | INHALATION_SOLUTION | Freq: Once | RESPIRATORY_TRACT | Status: DC
  Filled 2021-04-21: qty 20

## 2021-04-21 MED ORDER — EZETIMIBE 10 MG PO TABS
10.0000 mg | ORAL_TABLET | Freq: Every day | ORAL | Status: DC
Start: 2021-04-21 — End: 2021-04-29
  Administered 2021-04-21 – 2021-04-29 (×9): 10 mg
  Filled 2021-04-21 (×9): qty 1

## 2021-04-21 MED ORDER — GABAPENTIN 600 MG PO TABS
300.0000 mg | ORAL_TABLET | Freq: Two times a day (BID) | ORAL | Status: DC
  Filled 2021-04-21: qty 0.5

## 2021-04-21 MED ORDER — LORAZEPAM 2 MG/ML IJ SOLN
INTRAMUSCULAR | Status: AC
  Filled 2021-04-21: qty 1

## 2021-04-21 MED ORDER — FENTANYL BOLUS VIA INFUSION
25.0000 ug | INTRAVENOUS | Status: DC | PRN
  Administered 2021-04-23: 100 ug via INTRAVENOUS
  Administered 2021-04-24 – 2021-04-25 (×6): 50 ug via INTRAVENOUS
  Administered 2021-04-26 (×2): 100 ug via INTRAVENOUS
  Administered 2021-04-26: 75 ug via INTRAVENOUS
  Administered 2021-04-26 – 2021-04-27 (×3): 50 ug via INTRAVENOUS
  Administered 2021-04-28: 100 ug via INTRAVENOUS
  Filled 2021-04-21: qty 100

## 2021-04-21 MED ORDER — IPRATROPIUM-ALBUTEROL 0.5-2.5 (3) MG/3ML IN SOLN
3.0000 mL | RESPIRATORY_TRACT | Status: DC
  Administered 2021-04-21 – 2021-04-22 (×10): 3 mL via RESPIRATORY_TRACT
  Filled 2021-04-21 (×10): qty 3

## 2021-04-21 MED ORDER — FENTANYL CITRATE (PF) 100 MCG/2ML IJ SOLN
INTRAMUSCULAR | Status: AC
  Filled 2021-04-21: qty 2

## 2021-04-21 MED ORDER — LORAZEPAM 2 MG/ML IJ SOLN
2.0000 mg | INTRAMUSCULAR | Status: DC | PRN
  Administered 2021-04-21 – 2021-04-22 (×6): 2 mg via INTRAVENOUS
  Filled 2021-04-21 (×5): qty 1

## 2021-04-21 MED ORDER — FUROSEMIDE 10 MG/ML IJ SOLN
60.0000 mg | Freq: Once | INTRAMUSCULAR | Status: AC
  Administered 2021-04-21: 60 mg via INTRAVENOUS
  Filled 2021-04-21: qty 6

## 2021-04-21 MED ORDER — METHYLPREDNISOLONE SODIUM SUCC 40 MG IJ SOLR
40.0000 mg | Freq: Two times a day (BID) | INTRAMUSCULAR | Status: DC
  Administered 2021-04-21 – 2021-04-24 (×6): 40 mg via INTRAVENOUS
  Filled 2021-04-21 (×6): qty 1

## 2021-04-21 MED ORDER — FUROSEMIDE 10 MG/ML IJ SOLN
40.0000 mg | Freq: Two times a day (BID) | INTRAMUSCULAR | Status: DC
Start: 2021-04-21 — End: 2021-04-21
  Filled 2021-04-21: qty 4

## 2021-04-21 MED ORDER — DEXMEDETOMIDINE HCL IN NACL 400 MCG/100ML IV SOLN
INTRAVENOUS | Status: AC
  Filled 2021-04-21: qty 100

## 2021-04-21 MED ORDER — FENTANYL CITRATE PF 50 MCG/ML IJ SOSY
25.0000 ug | PREFILLED_SYRINGE | Freq: Once | INTRAMUSCULAR | Status: AC
  Administered 2021-04-21: 25 ug via INTRAVENOUS
  Filled 2021-04-21: qty 1

## 2021-04-21 MED ORDER — FENTANYL 2500MCG IN NS 250ML (10MCG/ML) PREMIX INFUSION
25.0000 ug/h | INTRAVENOUS | Status: DC
  Administered 2021-04-21: 75 ug/h via INTRAVENOUS
  Administered 2021-04-21 – 2021-04-23 (×5): 200 ug/h via INTRAVENOUS
  Administered 2021-04-24: 100 ug/h via INTRAVENOUS
  Administered 2021-04-24 – 2021-04-25 (×2): 150 ug/h via INTRAVENOUS
  Administered 2021-04-26: 175 ug/h via INTRAVENOUS
  Administered 2021-04-26 – 2021-04-28 (×4): 200 ug/h via INTRAVENOUS
  Filled 2021-04-21 (×14): qty 250

## 2021-04-21 MED ORDER — SUCCINYLCHOLINE CHLORIDE 200 MG/10ML IV SOSY
PREFILLED_SYRINGE | INTRAVENOUS | Status: AC
  Filled 2021-04-21: qty 10

## 2021-04-21 MED ORDER — KETAMINE HCL 50 MG/5ML IJ SOSY
PREFILLED_SYRINGE | INTRAMUSCULAR | Status: AC
  Filled 2021-04-21: qty 5

## 2021-04-21 MED ORDER — DOCUSATE SODIUM 50 MG/5ML PO LIQD
100.0000 mg | Freq: Two times a day (BID) | ORAL | Status: DC
  Administered 2021-04-21 – 2021-04-29 (×13): 100 mg
  Filled 2021-04-21 (×14): qty 10

## 2021-04-21 MED ORDER — APIXABAN 5 MG PO TABS
5.0000 mg | ORAL_TABLET | Freq: Two times a day (BID) | ORAL | Status: DC
Start: 2021-04-21 — End: 2021-04-29
  Administered 2021-04-21 – 2021-04-29 (×17): 5 mg
  Filled 2021-04-21 (×17): qty 1

## 2021-04-21 MED ORDER — ETOMIDATE 2 MG/ML IV SOLN
INTRAVENOUS | Status: AC
  Administered 2021-04-21: 20 mg
  Filled 2021-04-21: qty 20

## 2021-04-21 MED ORDER — MIDAZOLAM HCL 2 MG/2ML IJ SOLN
INTRAMUSCULAR | Status: AC
  Filled 2021-04-21: qty 2

## 2021-04-21 NOTE — Progress Notes (Signed)
Started ripping off mask. Delirious. Proceed with reintubation (after discussion with RT/RN/family) and increase steroids, diuretics (ordered).  Hold on continuous neb for now.  Intubation uneventful except that she desaturated nearly immediately with loss of PEEP.  Will update daytime MD.  Erskine Emery MD PCCM

## 2021-04-21 NOTE — Progress Notes (Addendum)
04/21/2021  Called to bedside for increased WOB  Patient RR in high 20s, more wheezy through night  Will wake up and follow commands  Breath sounds with obvious bronchospasm worse on R On precedex  - Hold precedex - CXR - 10mg  albuterol neb x 1 - Low threshold to reintubate but given comorbidities would like to hold off if possible  Discussed with RN/RT Family updated at bedside  Erskine Emery MD PCCM

## 2021-04-21 NOTE — Procedures (Signed)
Intubation Procedure Note  Kristin [name]  [id number]  [birth year]  Date:04/21/21  Time:1:38 AM   Provider Performing:Loney Domingo C Tamala Julian    Procedure: Intubation (61607)  Indication(s) Respiratory Failure  Consent Unable to obtain consent due to emergent nature of procedure.   Anesthesia Etomidate, Versed, Fentanyl, and Rocuronium   Time Out Verified patient identification, verified procedure, site/side was marked, verified correct patient position, special equipment/implants available, medications/allergies/relevant history reviewed, required imaging and test results available.   Sterile Technique Usual hand hygeine, masks, and gloves were used   Procedure Description Patient positioned in bed supine.  Sedation given as noted above.  Patient was intubated with endotracheal tube using Glidescope.  View was Grade 1 full glottis .  Number of attempts was 1.  Colorimetric CO2 detector was consistent with tracheal placement.   Complications/Tolerance None; patient tolerated the procedure well. Chest X-ray is ordered to verify placement.   EBL Minimal   Specimen(s) None

## 2021-04-21 NOTE — Consult Note (Signed)
Consultation Note Date: 04/21/2021   Patient Name: Kristin [name]  DOB: 09/19/1950  MRN: [id number]  Age / Sex: 70 y.o., female  PCP: [place] Referring Physician: Jacky Kindle, MD  Reason for Consultation: Establishing goals of care and Psychosocial/spiritual support  HPI/Patient Profile: 70 y.o. 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 fib with pauses, HTN/HLD, chronic respiratory failure on home O2, CAD OPD with continued smoking 4 to 5 cigarettes/day, obesity, sleep apnea, DM 2, hypothyroid, GERD, anxiety, admitted on 04/28/2021 with acute on chronic hypercapnic and hypoxic respiratory failure with respiratory acidosis and acute on chronic diastolic heart failure exacerbation.  Admitted to the ICU on 12/4 with acute on chronic hypoxic/hypercapnic respiratory failure/intubated.  Extubated 12/5 but failed due to increased work of breathing and was reintubated 12/6 early a.m.  Clinical Assessment and Goals of Care: I have reviewed medical records including EPIC notes, labs and imaging, received report from RN, assessed the patient.  Kristin [name] is lying quietly in bed.  She is intubated/ventilated and sedated.  She appears reasonably comfortable.  Her daughter/HC POA, Waynetta Sandy, is at bedside.    [name] and I met at the bedside to discuss diagnosis prognosis, GOC, EOL wishes, disposition and options.  I introduced Palliative Medicine as specialized medical care for people living with serious illness. It focuses on providing relief from the symptoms and stress of a serious illness. The goal is to improve quality of life for both the patient and the family.  Kristin [name] was seen by the palliative team in May of this year.  We focused on their current illness.  We talked about acute respiratory failure,  intubation/ventilation, weaning.  [name] shares that, at this point, her mother would not want any and all measures to continue with life.  She shares her concern about Kristin [name]'s ability to recover.  The natural disease trajectory and expectations at EOL were discussed.  Advanced directives, concepts specific to code status, were considered and discussed.  I share that during the palliative meeting in May, Kristin [name] stated that she would not want tracheostomy.  Daughter [name] agrees.  We talked about some timelines.  I share that if Kristin [name] is not showing meaningful improvements in the next few days, [name] may be called to make difficult choices.  I attempted to elicit values and goals of care important to the patient.  The difference between aggressive medical intervention and comfort care was considered in light of the patient's goals of care.   Discussed the importance of continued conversation with family and the medical providers regarding overall plan of care and treatment options, ensuring decisions are within the context of the patient's values and GOCs.  Questions and concerns were addressed.  The family was encouraged to call with questions or concerns.  PMT will continue to support holistically.  Conference with attending, bedside nursing staff, transition of care team related to patient condition, needs, goals of care,  disposition.   HCPOA   NEXT OF KIN -daughter, Waynetta Sandy    SUMMARY OF RECOMMENDATIONS   At this point continue to treat the treatable. Time for outcomes. No trach   Code Status/Advance Care Planning: Full code  Symptom Management:  Per hospitalist/CCM, no additional needs at this time.  Palliative Prophylaxis:  Oral Care and Turn Reposition  Additional Recommendations (Limitations, Scope, Preferences): Full Scope Treatment  Psycho-social/Spiritual:  Desire for further Chaplaincy support:no Additional Recommendations: Caregiving  Support/Resources and  ICU Family Guide  Prognosis:  Unable to determine, based on outcomes.  Guarded at this point.  Discharge Planning:  To be determined, based on outcomes.  Guarded at this point.       Primary Diagnoses: Present on Admission:  Acute respiratory failure ([place])   I have reviewed the medical record, interviewed the patient and family, and examined the patient. The following aspects are pertinent.  Past Medical History:  Diagnosis Date   (HFpEF) heart failure with preserved ejection fraction ([place])    a. 09/2020 Echo: EF >55%; b. 10/2020 Echo: EF 55-60%. RVSP 71mHg.   Anxiety    Asthma    CAD (coronary artery disease)    a. 03/2020 Cath: LM nl, LAD 20, LCX 60p, 24m, RCA 30p, 4091mPDA 30-->Med Rx.   [facility]  COPD (chronic obstructive pulmonary disease) (HCC)    Diabetes mellitus without complication (HCC)    Dyspnea    GERD (gastroesophageal reflux disease)    History of hiatal hernia    History of orthopnea    History of radiation therapy 07/29/20-08/08/20   Right Lung, SBRT Dr. JamGery PrayHypothyroidism    Moderate aortic stenosis    a. 03/2020 Echo: Mod-sev AS. AVA 1.11cm^2. Mean grad 56m44m b. 10/2020 Echo: EF 55-60%, no rwma, nl RV fxn, RVSP 30mm60mmild BAE, mod-sev mitral annular Ca2+ w/o MS/MR. Mod-sev AS. AVA 0.91cm^2 (VTI), mean grad 29mmH63m Neuropathy    Oxygen deficiency    3L/HS   PAF (paroxysmal atrial fibrillation) (HCC)  [place]. Post-termination pauses. Prev on amio.  CHA2DS2VASc 6-->eliqis; b. 01/2021 Zio: Predominantly RSR w/ avg HR 81 (41-194). 2 runs NSVT vs AF w/ aberrancy (longest 12 beats @ 185). 531 SVT runs (longest 11 beats; fastest 194). 10% Afib burden (80-185 bpm, avg 118). Longest Afib 58m49s61m pauses - longest 4.1secs - jxnl rhythm present. Isolated PACs (1.6%).   Pain    BACK/DDD   Peripheral vascular disease (HCC)    RLS (restless legs syndrome)    Sleep apnea    Wheezing    Social History   Socioeconomic History   Marital status:  Widowed    Spouse name: Not on file   Number of children: 4   Years of education: Not on file   Highest education level: Not on file  Occupational History   Occupation: RetiredHealth and safety inspectorco Use   Smoking status: Every Day    Packs/day: 1.00    Years: 50.00    Pack years: 50.00    Types: Cigarettes   Smokeless tobacco: Never   Tobacco comments:    4-5 cigs per day 01/23/2021  Vaping Use   Vaping Use: Never used  Substance and Sexual Activity   Alcohol use: Not Currently   Drug use: Never   Sexual activity: Never    Birth control/protection: None  Other Topics Concern   Not on file  Social History Narrative   [place];  judy- smokes 4-5 cig/day; no alcohol. Used to work in [employer]. 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    Social Determinants of Health   Financial Resource Strain: Not on file  Food Insecurity: Not on file  Transportation Needs: Not on file  Physical Activity: Not on file  Stress: Not on file  Social Connections: Not on file   Family History  Problem Relation Age of Onset   Heart disease Mother    Cancer Father    Scheduled Meds:  amiodarone  200 mg Per Tube Daily   apixaban  5 mg Per Tube BID   arformoterol  15 mcg Nebulization BID   atorvastatin  80 mg Per Tube QHS   budesonide (PULMICORT) nebulizer solution  0.25 mg Nebulization BID   chlorhexidine gluconate (MEDLINE KIT)  15 mL Mouth Rinse BID   Chlorhexidine Gluconate Cloth  6 each Topical Q0600   docusate  100 mg Per Tube BID   ezetimibe  10 mg Per Tube Daily   gabapentin  300 mg Per Tube BID   insulin aspart  0-15 Units Subcutaneous Q4H   ipratropium-albuterol  3 mL Nebulization Q4H   levothyroxine  75 mcg Per Tube Q0600   LORazepam       mouth rinse  15 mL Mouth Rinse 10 times per day   methylPREDNISolone (SOLU-MEDROL) injection  40 mg Intravenous Q12H   polyethylene glycol  17 g Per Tube Daily   revefenacin  175 mcg Nebulization Daily   Continuous Infusions:   azithromycin Stopped (04/20/21 1416)   cefTRIAXone (ROCEPHIN)  IV Stopped (04/20/21 1221)   dexmedetomidine (PRECEDEX) IV infusion 1 mcg/kg/hr (04/21/21 0600)   dexmedetomidine     famotidine (PEPCID) IV 20 mg (04/21/21 0916)   fentaNYL infusion INTRAVENOUS 200 mcg/hr (04/21/21 0900)   PRN Meds:.docusate, fentaNYL, hydrOXYzine, LORazepam, polyethylene glycol Medications Prior to Admission:  Prior to Admission medications   Medication Sig Start Date End Date Taking? Authorizing Provider  albuterol (PROVENTIL) (2.5 MG/3ML) 0.083% nebulizer solution Take 3 mLs (2.5 mg total) by nebulization every 4 (four) hours as needed for wheezing or shortness of breath. 01/23/21  Yes Tanda Rockers, MD  albuterol (VENTOLIN HFA) 108 (90 Base) MCG/ACT inhaler Inhale 2 puffs into the lungs every 4 (four) hours as needed for wheezing or shortness of breath.    Yes [provider]  apixaban (ELIQUIS) 5 MG TABS tablet Take 1 tablet (5 mg total) by mouth 2 (two) times daily. 11/03/20  Yes Minna Merritts, MD  atorvastatin (LIPITOR) 80 MG tablet Take 1 tablet (80 mg total) by mouth at bedtime. 11/03/20  Yes Minna Merritts, MD  citalopram (CELEXA) 40 MG tablet Take 40 mg by mouth daily. 06/14/20  Yes [provider]  diltiazem (CARDIZEM) 30 MG tablet Take 1 tablet (30 mg total) by mouth 3 (three) times daily as needed. For A-fib breakthrough 03/09/21  Yes Gollan, Kathlene November, MD  docusate sodium (COLACE) 100 MG capsule Take 100 mg by mouth at bedtime as needed for mild constipation or moderate constipation.   Yes [provider]  ezetimibe (ZETIA) 10 MG tablet Take 10 mg by mouth daily. 02/18/21  Yes [provider]  famotidine (PEPCID) 40 MG tablet Take 40 mg by mouth at bedtime.   Yes [provider]  fenofibrate 160 MG tablet Take 160 mg by mouth daily.   Yes [provider]  ferrous sulfate 325 (65 FE) MG tablet Take 325 mg by mouth daily. 06/14/20  Yes [provider]  gabapentin (NEURONTIN) 600 MG tablet Take 600 mg by mouth 2 (two) times daily.  11/20/19  Yes [provider]  hydrOXYzine (VISTARIL) 50 MG capsule Take 50 mg by mouth every 6 (six) hours as needed for anxiety. 03/14/20  Yes [provider]  irbesartan (AVAPRO) 75 MG tablet Take 1 tablet (75 mg total) by mouth daily. 01/02/21 12/28/21 Yes Wouk, Ailene Rud, MD  LAGEVRIO 200 MG CAPS capsule SMARTSIG:4 Capsule(s) By Mouth Every 12 Hours 03/11/21  Yes [provider]  levothyroxine (SYNTHROID) 75 MCG tablet Take 75 mcg by mouth daily before breakfast.   Yes [provider]  lidocaine (LIDODERM) 5 % Place 3 patches onto the skin daily. (Remove after 12 hours and keep off for 12 hours)   Yes [provider]  Melatonin 10 MG TABS Take 10 mg by mouth at bedtime as needed (sleep).   Yes [provider]  metFORMIN (GLUCOPHAGE) 500 MG tablet Take 500 mg by mouth 2 (two) times daily. 09/19/19  Yes [provider]  nystatin (MYCOSTATIN/NYSTOP) powder Apply topically 3 (three) times daily. 01/21/21  Yes Lorella Nimrod, MD  oxyCODONE-acetaminophen (PERCOCET) 10-325 MG tablet Take 1 tablet by mouth 5 (five) times daily as needed for pain.   Yes [provider]  pantoprazole (PROTONIX) 40 MG tablet Take 40 mg by mouth 2 (two) times daily. 11/11/20  Yes [provider]  sitaGLIPtin (JANUVIA) 100 MG tablet Take 100 mg by mouth daily.   Yes [provider]  Tiotropium Bromide-Olodaterol (STIOLTO RESPIMAT) 2.5-2.5 MCG/ACT AERS Inhale 2 puffs into the lungs daily. 01/23/21  Yes Tanda Rockers, MD  torsemide (DEMADEX) 20 MG tablet Take 1 tablet (20 mg total) by mouth daily. Alternating with 20 mg by mouth every other day Patient taking differently: Take 20-40 mg by mouth daily. Takes 20 mg qod and 40 mg other days 03/09/21  Yes Gollan, Kathlene November, MD  Vitamin D, Ergocalciferol, (DRISDOL) 1.25 MG (50000 UNIT) CAPS capsule Take  50,000 Units by mouth once a week. Mondays   Yes [provider]  MOVANTIK 25 MG TABS tablet Take 25 mg by mouth daily. Patient not taking: Reported on 04/16/2021 04/03/21   [provider]  nicotine (NICODERM CQ - DOSED IN MG/24 HOURS) 21 mg/24hr patch Place 1 patch (21 mg total) onto the skin daily. Patient not taking: Reported on 05/08/2021 08/15/20   Marrianne Mood D, PA-C   Allergies  Allergen Reactions   Altace [Ramipril] Swelling   Review of Systems  Unable to perform ROS: Intubated   Physical Exam Vitals and nursing note reviewed.  Constitutional:      General: She is not in acute distress.    Appearance: She is obese. She is ill-appearing.  Cardiovascular:     Rate and Rhythm: Normal rate.  Pulmonary:     Comments: Intubated/ventilated Abdominal:     Palpations: Abdomen is soft.  Musculoskeletal:        General: No swelling.  Skin:    General: Skin is warm and dry.     Coloration: Skin is pale.  Neurological:     Comments: Intubated/ventilated    Vital Signs: BP (!) 147/61   Pulse 71   Temp 98.1 F (36.7 C) (Axillary)   Resp 17   Ht '5\' 2"'  (1.575 m)   Wt 103.5 kg   LMP  (LMP Unknown)   SpO2 98%   BMI 41.73 kg/m  Pain Scale: 0-10   Pain Score: Asleep  SpO2: SpO2: 98 % O2 Device:SpO2: 98 % O2 Flow Rate: .   IO: Intake/output summary:  Intake/Output Summary (Last 24 hours) at 04/21/2021 1148 Last data filed at 04/21/2021 0900 Gross per 24 hour  Intake 931.45 ml  Output 1995 ml  Net -1063.55 ml    LBM: Last BM Date: 04/20/21 Baseline Weight: Weight: 114 kg Most recent weight: Weight: 103.5 kg     Palliative Assessment/Data:   Flowsheet Rows    Flowsheet Row Most Recent Value  Intake Tab   Referral Department Hospitalist  Unit at Time of Referral ICU  Palliative Care Primary Diagnosis Pulmonary  Date Notified 05/09/2021  Palliative Care Type Return patient Palliative Care  Reason for referral Clarify Goals of Care  Date of  Admission 05/13/2021  Date first seen by Palliative Care 04/21/21  # of days Palliative referral response time 2 Day(s)  # of days IP prior to Palliative referral 0  Clinical Assessment   Palliative Performance Scale Score 20%  Pain Max last 24 hours Not able to report  Pain Min Last 24 hours Not able to report  Dyspnea Max Last 24 Hours Not able to report  Dyspnea Min Last 24 hours Not able to report  Psychosocial & Spiritual Assessment   Palliative Care Outcomes        Time In: 0930 Time Out: 1020 Time Total: 50 minutes Greater than 50%  of this time was spent counseling and coordinating care related to the above assessment and plan.  Signed by: Drue Novel, NP   Please contact Palliative Medicine Team phone at [phone] for questions and concerns.  For individual provider: See Shea Evans

## 2021-04-21 NOTE — Progress Notes (Signed)
Nutrition Follow Up Note   DOCUMENTATION CODES:   Morbid obesity  INTERVENTION:   Initiate Vital HP @ 73m/hr continuous   Free water flushes 362mq4 hours to maintain tube patency   Regimen provides 1320kcal/day, 116g/day protein and 128426may free water   Liquid MVI daily via tube   NUTRITION DIAGNOSIS:   Inadequate oral intake related to inability to eat (pt sedated and ventilated) as evidenced by NPO status.  GOAL:   Provide needs based on ASPEN/SCCM guidelines -not met   MONITOR:   Vent status, Labs, Weight trends, TF tolerance, Skin, I & O's  ASSESSMENT:   69 5o. 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 fib on Eliquis [facility].  Pt extubated 12/5 and re-intubated 12/6. OGT in place but needs advancement; RN and MD notified. Will plan to initiate tube feeds today. Per chart, pt is down ~5lbs from her UBW.   Medications reviewed and include: colace, insulin, synthroid, solu-medrol, miralax, azithromycin, ceftriaxone, precedex, pepcid  Labs reviewed: K 4.7 wnl, BUN 53(H), creat 2.19(H), P 6.0(H), Mg 2.4 wnl Hgb 8.6(L), Hct 28.3(L) Cbgs- 142, 165, 148, 114 x 24 hrs   Patient is currently intubated on ventilator support MV: 9.6 L/min Temp (24hrs), Avg:98.5 F (36.9 C), Min:97.8 F (36.6 C), Max:99.3 F (37.4 C)  Propofol: none   MAP- >73m16m  UOP- 1755ml31miet Order:   Diet Order             Diet NPO time specified  Diet effective now                  EDUCATION NEEDS:   No education needs have been identified at  this time  Skin:  Skin Assessment: Reviewed RN Assessment  Last BM:  12/5- type 7  Height:   Ht Readings from Last 1 Encounters:  05/06/2021 _0  (1.575 m)    Weight:   Wt Readings from Last 1 Encounters:  04/21/21 103.5 kg    Ideal Body Weight:  50 kg  BMI:  Body mass index is 41.73 kg/m.  Estimated Nutritional Needs:   Kcal:  1139-1449kcal/day  Protein:  >100g/day  Fluid:  1.3-1.5L/day  CaseyKoleen DistanceRD, LDN Please refer to [facility] and/or RD on-call/weekend/after hours pager

## 2021-04-21 NOTE — Progress Notes (Signed)
[facility] Progress Note Patient Name: Kristin [name] DOB: 10/17/1950 MRN: [id number]   Date of Service  04/21/2021  HPI/Events of Note  Patient with increased work of breathing on BIPAP.  eICU Interventions  IPAP increased to 10 and stat ABG ordered.        Kristin [name] 04/21/2021, 1:01 AM

## 2021-04-21 NOTE — Consult Note (Signed)
PHARMACY CONSULT NOTE  Pharmacy Consult for Electrolyte Monitoring and Replacement   Recent Labs: Potassium (mmol/L)  Date Value  04/21/2021 4.7   Magnesium (mg/dL)  Date Value  04/21/2021 2.4   Calcium (mg/dL)  Date Value  04/21/2021 8.9   Albumin (g/dL)  Date Value  04/20/2021 3.4 (L)   Phosphorus (mg/dL)  Date Value  04/21/2021 6.0 (H)   Sodium (mmol/L)  Date Value  04/21/2021 142   Assessment: Patient is a 70 y/o F with medical history including HFpEF, AS, Afib, HTN, chronic respiratory failure, tobacco abuse, COPD, OSA, hypothyroidism, diabetes, anxiety, GERD, lung cancer who is admitted with acute on chronic respiratory failure requiring intubation and mechanical ventilation. Pharmacy consulted to assist with electrolyte replacement as indicated.   Patient extubated 12/5. Failed BiPAP. Ultimately re-intubated 12/6  Goal of Therapy:  Electrolytes within normal limits  Plan:  --No electrolyte replacement indicated at this time. Noted worsening SCr --Follow-up electrolytes with AM labs tomorrow  Benita Gutter 04/21/2021 8:52 AM

## 2021-04-21 NOTE — Progress Notes (Signed)
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  Per ED notes and EMS run sheet, patient was found with an oxygen saturation somewhere between the 50s and 70s, obviously tired but responsive. Pt placed on CPAP en route to the ER by EMS and treated with Duonebs and IV solumedrol. However in spite of starting her on CPAP and giving her Solu-Medrol and 1 DuoNeb breathing treatment, she became less responsive during transit.  Patient was intubated in the emergency department, placed on mechanical ventilation, PCCM was consulted for evaluation and help with management   Past Medical History    Acute respiratory failure ([place]) 04/25/2021   Primary cancer of right lower lobe of lung ([place]) 02/20/2021   COPD GOLD ? / active smoker 01/23/2021   Cigarette smoker 01/23/2021   Acute on chronic respiratory failure ([place]) 01/17/2021   Junctional rhythm 01/02/2021   Acute on chronic renal failure ([place]) 01/02/2021   Tachycardia-bradycardia syndrome ([place]) 01/02/2021   Weakness     Acute hypoxemic respiratory failure ([place]) 12/23/2020   Acute exacerbation of chronic obstructive pulmonary disease (COPD) ([place]) 12/22/2020   Aspiration into airway     PAF (paroxysmal atrial fibrillation) (HCC)     CAD (coronary artery disease) 09/26/2020   Hyperkalemia 09/26/2020   CHF exacerbation ([place]) 09/26/2020   Acute on chronic respiratory failure  with hypoxia and hypercapnia ([place]) 09/25/2020   Bradycardia 09/25/2020   Lung cancer ([place]) 05/26/2020   Atrial flutter with rapid ventricular response ([place]) 05/26/2020   Demand ischemia ([place])     Acute on chronic heart failure with preserved ejection fraction (HFpEF) ([place])     Aortic stenosis-moderate to severe 03/2020     Obesity, Class III, BMI 40-49.9 (morbid obesity) ([place]) 03/29/2020   Chronic respiratory failure with hypoxia ([place]) 03/29/2020   OSA (obstructive sleep apnea) 03/29/2020   Essential hypertension 03/29/2020   Hypothyroidism 03/29/2020   Chest pain 03/29/2020   Elevated troponin 03/29/2020   Chronic diastolic (congestive) heart failure ([place]) 07/12/2018   Type 2 diabetes mellitus without complication ([place]) 34/74/2595   COPD exacerbation ([place]) 07/12/2018   Tobacco use 07/12/2018   Hypotension 07/12/2018   Chronic respiratory failure with hypoxia and hypercapnia ([place]) 07/04/2018   Morbid obesity due to excess calories ([place]) 02/08/2016    Significant Hospital Events   12/4: Admitted to the ICU with acute on chronic hypoxic hypercapnic respiratory failure. 12/5: Patient was extubated but she failed due to increased work of breathing, hypoxia and CO2 retention  Consults:  PCCM  Procedures:  12/4> Intubation >> 12/5 extubation 12/5 reintubation  OBJECTIVE  Blood pressure (!) 162/67, pulse 66, temperature 98.1 F (36.7 C), temperature source Axillary, resp. rate (!) 22, height 5\' 2"  (1.575 m), weight 103.5 kg, SpO2 95 %.    Vent Mode: PRVC FiO2 (%):  [30 %-45 %] 45 % Set Rate:  [16 bmp-22 bmp] 22 bmp Vt Set:  [500 mL] 500 mL PEEP:  [5 cmH20] 5 cmH20 Pressure Support:  [8 cmH20] 8  cmH20 Plateau Pressure:  [21 cmH20] 21 cmH20   Intake/Output Summary (Last 24 hours) at 04/21/2021 0814 Last data filed at 04/21/2021 0800 Gross per 24 hour  Intake 942.47 ml  Output 2175 ml  Net -1232.53 ml   Filed Weights   04/22/2021 0750 04/20/21 0500 04/21/21 0338  Weight: 105.4  kg 110.1 kg 103.5 kg     Physical Examination    Physical exam: General: Crtitically ill-appearing morbidly obese female, orally intubated HEENT: [place]/AT, eyes anicteric.  ETT and OGT in place Neuro: Sedated, not following commands, eyes closed. Chest: Bilateral expiratory wheezes, No rhonchi Heart: Regular rate and rhythm, no murmurs or gallops Abdomen: Soft, nontender, nondistended, bowel sounds present Skin: No rash  Labs   CBC: Recent Labs  Lab 05/10/2021 0608 04/20/21 0515  WBC 15.4* 8.1  NEUTROABS 10.9*  --   HGB 10.9* 8.6*  HCT 36.3 28.3*  MCV 91.9 88.7  PLT 305 045    Basic Metabolic Panel: Recent Labs  Lab 05/10/2021 0608 04/20/21 0515 04/21/21 0450  NA 138 143 142  K 4.0 4.7 4.7  CL 94* 103 100  CO2 35* 33* 32  GLUCOSE 264* 146* 157*  BUN 25* 35* 53*  CREATININE 1.67* 1.78* 2.19*  CALCIUM 9.0 8.9 8.9  MG  --  2.3 2.4  PHOS  --  4.3 6.0*   GFR: Estimated Creatinine Clearance: 27.8 mL/min (A) (by C-G formula based on SCr of 2.19 mg/dL (H)). Recent Labs  Lab 05/10/2021 0608 04/20/21 0515 04/21/21 0450  PROCALCITON <0.10 0.53 0.43  WBC 15.4* 8.1  --   LATICACIDVEN 1.9  --   --     Liver Function Tests: Recent Labs  Lab 05/03/2021 0608  AST 15  ALT 11  ALKPHOS 44  BILITOT 0.6  PROT 7.5  ALBUMIN 3.4*   No results for input(s): LIPASE, AMYLASE in the last 168 hours. No results for input(s): AMMONIA in the last 168 hours.  ABG    Component Value Date/Time   PHART 7.28 (L) 04/21/2021 0358   PCO2ART 76 (HH) 04/21/2021 0358   PO2ART 98 04/21/2021 0358   HCO3 35.7 (H) 04/21/2021 0358   O2SAT 96.7 04/21/2021 0358     Coagulation Profile: No results for input(s): INR, PROTIME in the last 168 hours.  Cardiac Enzymes: No results for input(s): CKTOTAL, CKMB, CKMBINDEX, TROPONINI in the last 168 hours.  HbA1C: Hgb A1c MFr Bld  Date/Time Value Ref Range Status  04/22/2021 06:08 AM 6.0 (H) 4.8 - 5.6 % Final    Comment:    (NOTE)          Prediabetes: 5.7 - 6.4         Diabetes: >6.4         Glycemic control for adults with diabetes: <7.0   12/23/2020 05:09 AM 5.4 4.8 - 5.6 % Final    Comment:    (NOTE) Pre diabetes:          5.7%-6.4%  Diabetes:              >6.4%  Glycemic control for   <7.0% adults with diabetes     CBG: Recent Labs  Lab 04/20/21 1516 04/20/21 1932 04/21/21 0019 04/21/21 0406 04/21/21 0724  GLUCAP 120* 125* 114* 148* 165*     Assessment & Plan:  Acute on chronic hypercapnic hypoxic respiratory failure  Acute respiratory acidosis Acute on chronic diastolic CHF exacerbation  Acute COPD exacerbation  OSA  Community-acquired pneumonia with gram-positive cocci  Tobacco dependence  Malignant neoplasm of right lung  Continue lung protective ventilation Patient failed extubation trial yesterday due to tachypnea, increased work of breathing Wean PEEP & FiO2 as tolerated, maintain SpO2 > 90% Peak and plateau pressures are at goal Vent setting was adjusted to clear hypercapnia Repeat ABGs She does have thin frothy secretions which is consistent with acute CHF exacerbation and pulmonary edema X-ray chest confirmed bilateral interstitial infiltrates She received multiple doses of IV Lasix yesterday which led to increase in serum creatinine Continue Lasix 40 mg twice daily Respiratory culture is growing gram-positive cocci Continue IV ceftriaxone and azithromycin Smoking cessation counseling when able to Outpatient follow-up with oncology Continue budesonide inhaler nebs BID, bronchodilators PRNBronchodilators PRN. Continue IV Solu-Medrol  Paroxysmal atrial fibrillation Patient was in and out of A. fib yesterday Now she is in sinus rhythm with heart rate in 60s Restarted back on amiodarone Continue Eliquis for stroke prophylaxis   Severe Aortic stenosis (followed by structural clinic not candidate for invasive procedures) Continuous cardiac monitoring Maintain MAP greater than  65 Continue atorvastatin Hold Irbesartan the setting of AKI Cardiology consulted   AKI on CKD-Stage IIIa Hyperphosphatemia Likely due to cardiorenal syndrome Patient serum creatinine continue to trend up with aggressive diuresis Monitor I&O's / urinary output Avoid nephrotoxic agents as able Replace electrolytes as indicated and closely monitor   Diabetes type 2, complicated with diabetic Peripheral Neuropathy Monitor fingerstick with goal 140-180 Continue sliding scale insulin Hold home Metformin & Januvia Decrease gabapentin to 300 mg twice daily due to AKI  Squamous cell carcinoma right lower lobe (04/2020) status post SBRT CT scan PET scan September 2022  imaging findings highly concerning for recurrent malignancy Per Oncology last visit holding off biopsy given patient's tenuous respiratory status.   On Keytruda IV infusion every 3 weeks  Outpatient follow-up with oncology  Hypothyroidism Continue Synthroid  Anemia of chronic disease Monitor H&H and transfuse if less than 8  Morbid obesity Dietitian follow-up   Best practice:  Diet:  NPO, start tube feeds VAP protocol (if indicated): Yes DVT prophylaxis: Systemic AC GI prophylaxis: PPI Glucose control:  SSI Yes Foley:  Yes, and it is still needed Mobility:  bed rest  PT consulted: N/A Last date of multidisciplinary goals of care discussion [12/5] Code Status:  full code Disposition: ICU    Primary Emergency Contact: MASHBURN,JUDY A, Home Phone: [phone] Wishes to pursue full aggressive treatment and intervention options, including CPR, intubation even tracheostomy if needed  Critical care time:     Total critical care time: 65 minutes  Performed by: Jacky Kindle   Critical care time was exclusive of separately billable procedures and treating other patients.   Critical care was necessary to treat or prevent imminent or life-threatening deterioration.   Critical care was time spent personally by  me on the following activities: development of treatment plan with patient and/or surrogate as well as nursing, discussions with consultants, evaluation of patient's response to treatment, examination of patient, obtaining history from patient or surrogate, ordering and performing treatments and interventions, ordering and review of laboratory studies, ordering and review of radiographic studies, pulse oximetry and re-evaluation of patient's condition.   Jacky Kindle MD [facility] See Amion for pager If no response to pager, please call [phone] until 7pm After 7pm, Please call E-link [phone]   .

## 2021-04-21 NOTE — Progress Notes (Signed)
Increased work of breathing on Bi pap Lung sound [place]  ABG done.  Intubated by MD. Pt tolerated the procedure wellFamily at bed side. Chest x-ray done.  Fent. Drip started. Resting comfortably in bed.

## 2021-04-21 NOTE — Progress Notes (Signed)
Failed bipap after extubation 12/5, increased wob noted, abg unremarkable. Intubation done with no complications noted. No further changes at this time.

## 2021-04-21 NOTE — Progress Notes (Signed)
Progress Note  Patient Name: Kristin [name] Date of Encounter: 04/21/2021  Primary Cardiologist: Rockey Situ  Subjective   Extubated yesterday, though required reintubation early this morning secondary to increased work of breathing.  Maintaining sinus rhythm.  Tracheal aspirate growing GPC's.  BNP lower than prior readings.  Inpatient Medications    Scheduled Meds:  amiodarone  200 mg Per Tube Daily   apixaban  5 mg Per Tube BID   arformoterol  15 mcg Nebulization BID   atorvastatin  80 mg Per Tube QHS   budesonide (PULMICORT) nebulizer solution  0.25 mg Nebulization BID   chlorhexidine gluconate (MEDLINE KIT)  15 mL Mouth Rinse BID   Chlorhexidine Gluconate Cloth  6 each Topical Q0600   docusate sodium  100 mg Oral BID   ezetimibe  10 mg Per Tube Daily   gabapentin  300 mg Per Tube BID   insulin aspart  0-15 Units Subcutaneous Q4H   ipratropium-albuterol  3 mL Nebulization Q4H   levothyroxine  75 mcg Per Tube Q0600   LORazepam       mouth rinse  15 mL Mouth Rinse 10 times per day   methylPREDNISolone (SOLU-MEDROL) injection  40 mg Intravenous Q12H   polyethylene glycol  17 g Per Tube Daily   revefenacin  175 mcg Nebulization Daily   Continuous Infusions:  azithromycin Stopped (04/20/21 1416)   cefTRIAXone (ROCEPHIN)  IV Stopped (04/20/21 1221)   dexmedetomidine (PRECEDEX) IV infusion 1 mcg/kg/hr (04/21/21 0600)   dexmedetomidine     famotidine (PEPCID) IV 20 mg (04/21/21 0916)   fentaNYL infusion INTRAVENOUS 200 mcg/hr (04/21/21 0900)   PRN Meds: docusate sodium, fentaNYL, hydrOXYzine, LORazepam, polyethylene glycol   Vital Signs    Vitals:   04/21/21 0600 04/21/21 0700 04/21/21 0800 04/21/21 0900  BP: (!) 171/68 (!) 164/84 (!) 162/67 (!) 147/61  Pulse: 66 65 66 71  Resp: (!) 22 (!) 22 (!) 22 17  Temp:   98.1 F (36.7 C)   TempSrc:   Axillary   SpO2: 94% 94% 95% 98%  Weight:      Height:        Intake/Output Summary (Last 24 hours) at 04/21/2021 1025 Last  data filed at 04/21/2021 0900 Gross per 24 hour  Intake 990.03 ml  Output 1995 ml  Net -1004.97 ml   Filed Weights   05/15/2021 0750 04/20/21 0500 04/21/21 0338  Weight: 105.4 kg 110.1 kg 103.5 kg    Telemetry    SR with PVCs - Personally Reviewed  ECG    No new tracings - Personally Reviewed  Physical Exam   GEN: Ill-appearing, no acute distress.   Neck: JVD difficult to assess secondary to body habitus and respiratory support apparatus. Cardiac: RRR, II/VI systolic murmur RUSB, no rubs, or gallops.  Respiratory: Vented and rhonchorous breath sounds bilaterally.  GI: Soft, nontender, non-distended.   MS: No edema; No deformity. Neuro: Intubated and sedated.  Psych: Intubated and sedated.  Labs    Chemistry Recent Labs  Lab 04/28/2021 0608 04/20/21 0515 04/21/21 0450  NA 138 143 142  K 4.0 4.7 4.7  CL 94* 103 100  CO2 35* 33* 32  GLUCOSE 264* 146* 157*  BUN 25* 35* 53*  CREATININE 1.67* 1.78* 2.19*  CALCIUM 9.0 8.9 8.9  PROT 7.5  --   --   ALBUMIN 3.4*  --   --   AST 15  --   --   ALT 11  --   --  ALKPHOS 44  --   --   BILITOT 0.6  --   --   GFRNONAA 33* 31* 24*  ANIONGAP '9 7 10     ' Hematology Recent Labs  Lab 05/16/2021 0608 04/20/21 0515  WBC 15.4* 8.1  RBC 3.95 3.19*  HGB 10.9* 8.6*  HCT 36.3 28.3*  MCV 91.9 88.7  MCH 27.6 27.0  MCHC 30.0 30.4  RDW 13.7 14.0  PLT 305 251    Cardiac EnzymesNo results for input(s): TROPONINI in the last 168 hours. No results for input(s): TROPIPOC in the last 168 hours.   BNP Recent Labs  Lab 05/08/2021 0608  BNP 245.2*     DDimer No results for input(s): DDIMER in the last 168 hours.   Radiology    DG Chest 1 View  Result Date: 04/21/2021 IMPRESSION: Support tubes in appropriate position. Mild cardiogenic failure.  Stable cardiomegaly. Preserved pulmonary insufflation. Electronically Signed   By: Fidela Salisbury M.D.   On: 04/21/2021 02:16   DG Chest 1 View  Result Date: 04/21/2021 IMPRESSION:  Interval extubation with preservation of pulmonary insufflation. Electronically Signed   By: Fidela Salisbury M.D.   On: 04/21/2021 01:33   DG Abd 1 View  Result Date: 04/21/2021 IMPRESSION: Withdrawal of the nasogastric tube with its tip now just beyond the gastroesophageal junction. Advancement of the catheter by 10-15 cm would more optimally position the catheter. Electronically Signed   By: Fidela Salisbury M.D.   On: 04/21/2021 02:14   DG Abd 1 View  Result Date: 04/23/2021 IMPRESSION: Slight withdrawal of the gastric catheter although the tip projects in the expected region of the proximal duodenum. Electronically Signed   By: Inez Catalina M.D.   On: 05/07/2021 23:11   DG Abd 1 View  Result Date: 05/16/2021 IMPRESSION: Orogastric tube side port in the duodenum. Electronically Signed   By: Ulyses Jarred M.D.   On: 04/25/2021 21:01    Cardiac Studies   2D echo 04/16/2021: 1. Left ventricular ejection fraction, by estimation, is 50 to 55%. The  left ventricle has low normal function. The left ventricle has no regional  wall motion abnormalities. Left ventricular diastolic parameters are  consistent with Grade II diastolic  dysfunction (pseudonormalization).   2. Right ventricular systolic function is low normal. The right  ventricular size is not well visualized.   3. Left atrial size was moderately dilated.   4. The mitral valve is degenerative. No evidence of mitral valve  regurgitation. Moderate mitral annular calcification.   5. Aortic valve mean gradient = 64mHg, peak gradient 674mg, Vmax  3.70m2m DVI= 0.23, AVA 0.7cm2. . The aortic valve is calcified. Aortic  valve regurgitation is not visualized. Severe aortic valve stenosis.   6. The inferior vena cava is normal in size with greater than 50%  respiratory variability, suggesting right atrial pressure of 3 mmHg.   Conclusion(s)/Recommendation(s): Findings consistent with severe valvular  heart disease.  __________   [facility]:   Left Main  Vessel is angiographically normal.  Left Anterior Descending  Mid LAD lesion is 20% stenosed. The lesion is mildly calcified.  Left Circumflex  Prox Cx lesion is 60% stenosed. The lesion is moderately calcified.  Mid Cx to Dist Cx lesion is 20% stenosed.  Second Obtuse Marginal Branch  Vessel is angiographically normal.  Third Obtuse Marginal Branch  Vessel is angiographically normal.  Right Coronary Artery  Prox RCA lesion is 30% stenosed. The lesion is mildly calcified.  Mid RCA lesion is  40% stenosed. The lesion is mildly calcified.  Right Posterior Descending Artery  RPDA lesion is 30% stenosed.    Patient Profile     70 y.o. female with history of nonobstructive CAD, SCC of the RLL, severe aortic stenosis undergoing TAVR evaluation, atrial fib/flutter diagnosed in 05/2020, HFpEF, pSVT, Covid in 02/2021, chronic hypoxic respiratory failure on home oxygen, COPD, anemia of chronic disease, DM2, hypothyroidism, sleep apnea, and GERD  who is being seen today for the evaluation of severe aortic stenosis and history of atrial fib/flutter at the request of Ms. Rust-Chester, NP.  Assessment & Plan    1. Acute on chronic hypoxic respiratory failure: -Suspect this is largely a pulmonary driven process in the setting of acute COPD exacerbation with possible pneumonia and less likely a CHF exacerbation given worsening renal function with IV Lasix, and in the context of her BNP improved from prior readings.  Symptoms are also likely exacerbated by morbid obesity with OSA, possible OHS, and underlying anemia -Tracheal aspirate with GPC on Gram stain  -Extubated 12/5 with reintubation 12/6 due to increased work of breathing -Known to severe aortic stenosis does not appear to be responsible for her current presentation -Mechanical ventilation per CCM -Supportive care   2. Severe aortic stenosis: -Recent echo on 04/16/2021 as outlined above -Follow up with the structural  heart team after she improves from her acute illness   3. PAF/flutter/pSVT: -Maintaining sinus rhythm -High risk for recurrent atrial arrhythmia in the context of her acute respiratory illness -Previously evaluated by EP for PAF/SVT in the context with pauses of up to 4.1 seconds -PTA amiodarone and prn diltiazem can be continued  -CHADS2VASc at least 6 (CHF, HTN, age x 1, DM, vascular disease, sex category) -PTA Eliquis, may need to be held if hemoglobin goes below 8 -Continue tele   4. Nonobstructive CAD: -High sensitivity troponin negative x 2 -Eliquis in place of ASA to minimize bleeding risk -PTA Lipitor and Zetia   5. HFpEF: -Volume status is difficult on exam secondary to body habitus  -BNP improved from prior readings -We will hold IV Lasix given worsening renal function -Strict I/O -Recent echo 04/16/2021, no indication to repeat at this time -Differ addition of MRA/SGLT2i at this time given underlying CKD and acute illness    6. SCC of the RLL with ongoing tobacco use: -Per primary service and oncology    7.  Acute on CKD stage III: -Hold IV Lasix -Monitor    8. Anemia: -Possibly of chronic disease -HGB has trended from 12 in 01/2021 down to 8.6 on last check and is down from 10.9 at admission  -CBC pending at this morning -May need to hold Eliquis if HGB continues down trend to less than 8 -Workup per primary service    May benefit from palliative care consult for goals of discussion with daughter, who is at bedside and understands guarded prognosis       For questions or updates, please contact [facility] Please consult [email or web address] for contact info under Cardiology/STEMI.    Signed, Christell Faith, PA-C [place] Pager: [phone] 04/21/2021, 10:25 AM

## 2021-04-22 ENCOUNTER — Inpatient Hospital Stay: Payer: Medicare Other

## 2021-04-22 DIAGNOSIS — I350 Nonrheumatic aortic (valve) stenosis: Secondary | ICD-10-CM

## 2021-04-22 DIAGNOSIS — I5031 Acute diastolic (congestive) heart failure: Secondary | ICD-10-CM | POA: Diagnosis not present

## 2021-04-22 DIAGNOSIS — I480 Paroxysmal atrial fibrillation: Secondary | ICD-10-CM | POA: Diagnosis not present

## 2021-04-22 DIAGNOSIS — J9601 Acute respiratory failure with hypoxia: Secondary | ICD-10-CM | POA: Diagnosis not present

## 2021-04-22 DIAGNOSIS — J9602 Acute respiratory failure with hypercapnia: Secondary | ICD-10-CM | POA: Diagnosis not present

## 2021-04-22 DIAGNOSIS — N179 Acute kidney failure, unspecified: Secondary | ICD-10-CM | POA: Diagnosis not present

## 2021-04-22 LAB — COMPREHENSIVE METABOLIC PANEL
ALT: 12 U/L (ref 0–44)
AST: 15 U/L (ref 15–41)
Albumin: 3.3 g/dL — ABNORMAL LOW (ref 3.5–5.0)
Alkaline Phosphatase: 33 U/L — ABNORMAL LOW (ref 38–126)
Anion gap: 7 (ref 5–15)
BUN: 53 mg/dL — ABNORMAL HIGH (ref 8–23)
CO2: 33 mmol/L — ABNORMAL HIGH (ref 22–32)
Calcium: 8.8 mg/dL — ABNORMAL LOW (ref 8.9–10.3)
Chloride: 105 mmol/L (ref 98–111)
Creatinine, Ser: 1.87 mg/dL — ABNORMAL HIGH (ref 0.44–1.00)
GFR, Estimated: 29 mL/min — ABNORMAL LOW (ref >60)
Glucose, Bld: 198 mg/dL — ABNORMAL HIGH (ref 70–99)
Potassium: 4.8 mmol/L (ref 3.5–5.1)
Sodium: 145 mmol/L (ref 135–145)
Total Bilirubin: 0.6 mg/dL (ref 0.3–1.2)
Total Protein: 6.9 g/dL (ref 6.5–8.1)

## 2021-04-22 LAB — CBC
HCT: 31.4 % — ABNORMAL LOW (ref 36.0–46.0)
Hemoglobin: 9.4 g/dL — ABNORMAL LOW (ref 12.0–15.0)
MCH: 27.2 pg (ref 26.0–34.0)
MCHC: 29.9 g/dL — ABNORMAL LOW (ref 30.0–36.0)
MCV: 90.8 fL (ref 80.0–100.0)
Platelets: 254 10*3/uL (ref 150–400)
RBC: 3.46 MIL/uL — ABNORMAL LOW (ref 3.87–5.11)
RDW: 14.2 % (ref 11.5–15.5)
WBC: 7.8 10*3/uL (ref 4.0–10.5)
nRBC: 0 % (ref 0.0–0.2)

## 2021-04-22 LAB — BLOOD GAS, ARTERIAL
Acid-Base Excess: 10.2 mmol/L — ABNORMAL HIGH (ref 0.0–2.0)
Bicarbonate: 37.9 mmol/L — ABNORMAL HIGH (ref 20.0–28.0)
FIO2: 0.45
MECHVT: 400 mL
Mechanical Rate: 24
O2 Saturation: 96.6 %
PEEP: 10 cmH2O
Patient temperature: 37
RATE: 24 resp/min
pCO2 arterial: 67 mmHg (ref 32.0–48.0)
pH, Arterial: 7.36 (ref 7.350–7.450)
pO2, Arterial: 90 mmHg (ref 83.0–108.0)

## 2021-04-22 LAB — GLUCOSE, CAPILLARY
Glucose-Capillary: 174 mg/dL — ABNORMAL HIGH (ref 70–99)
Glucose-Capillary: 183 mg/dL — ABNORMAL HIGH (ref 70–99)
Glucose-Capillary: 191 mg/dL — ABNORMAL HIGH (ref 70–99)
Glucose-Capillary: 192 mg/dL — ABNORMAL HIGH (ref 70–99)

## 2021-04-22 LAB — CULTURE, RESPIRATORY W GRAM STAIN: Culture: NORMAL

## 2021-04-22 LAB — MRSA NEXT GEN BY PCR, NASAL: MRSA by PCR Next Gen: NOT DETECTED

## 2021-04-22 LAB — MAGNESIUM: Magnesium: 2.4 mg/dL (ref 1.7–2.4)

## 2021-04-22 LAB — PHOSPHORUS: Phosphorus: 3.9 mg/dL (ref 2.5–4.6)

## 2021-04-22 MED ORDER — PIPERACILLIN-TAZOBACTAM 3.375 G IVPB
3.3750 g | Freq: Three times a day (TID) | INTRAVENOUS | Status: DC
  Administered 2021-04-22 – 2021-04-27 (×15): 3.375 g via INTRAVENOUS
  Filled 2021-04-22 (×15): qty 50

## 2021-04-22 MED ORDER — IPRATROPIUM-ALBUTEROL 0.5-2.5 (3) MG/3ML IN SOLN: 3.0000 mL | Freq: Four times a day (QID) | RESPIRATORY_TRACT | Status: DC | PRN

## 2021-04-22 MED ORDER — FAMOTIDINE 20 MG PO TABS
20.0000 mg | ORAL_TABLET | Freq: Two times a day (BID) | ORAL | Status: DC
  Administered 2021-04-22 – 2021-04-29 (×14): 20 mg
  Filled 2021-04-22 (×14): qty 1

## 2021-04-22 MED ORDER — IPRATROPIUM-ALBUTEROL 0.5-2.5 (3) MG/3ML IN SOLN: 3.0000 mL | Freq: Four times a day (QID) | RESPIRATORY_TRACT | Status: DC

## 2021-04-22 MED ORDER — ACETAMINOPHEN 325 MG PO TABS
650.0000 mg | ORAL_TABLET | Freq: Four times a day (QID) | ORAL | Status: DC | PRN
  Administered 2021-04-22 – 2021-04-29 (×5): 650 mg
  Filled 2021-04-22 (×5): qty 2

## 2021-04-22 NOTE — Progress Notes (Signed)
Patient hypertensive, see flowsheets.  Dr. Tacy Learn notified.  No new orders obtained.

## 2021-04-22 NOTE — Progress Notes (Addendum)
Oral temperature 102.4.  Dr. Tacy Learn notified and gave verbal order to apply ice packs. Ice packs applied to armpits and groin.

## 2021-04-22 NOTE — Progress Notes (Signed)
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  Per ED notes and EMS run sheet, patient was found with an oxygen saturation somewhere between the 50s and 70s, obviously tired but responsive. Pt placed on CPAP en route to the ER by EMS and treated with Duonebs and IV solumedrol. However in spite of starting her on CPAP and giving her Solu-Medrol and 1 DuoNeb breathing treatment, she became less responsive during transit.  Patient was intubated in the emergency department, placed on mechanical ventilation, PCCM was consulted for evaluation and help with management   Past Medical History    Acute respiratory failure ([place]) 05/13/2021   Primary cancer of right lower lobe of lung ([place]) 02/20/2021   COPD GOLD ? 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   Weakness     Acute hypoxemic respiratory failure ([place]) 12/23/2020   Acute exacerbation of chronic obstructive pulmonary disease (COPD) ([place]) 12/22/2020   Aspiration into airway     PAF (paroxysmal atrial fibrillation) (HCC)     CAD (coronary artery disease) 09/26/2020   Hyperkalemia 09/26/2020   CHF exacerbation ([place]) 09/26/2020   Acute on chronic respiratory failure  with hypoxia and hypercapnia ([place]) 09/25/2020   Bradycardia 09/25/2020   Lung cancer ([place]) 05/26/2020   Atrial flutter with rapid ventricular response ([place]) 05/26/2020   Demand ischemia ([place])     Acute on chronic heart failure with preserved ejection fraction (HFpEF) ([place])     Aortic stenosis-moderate to severe 03/2020     Obesity, Class III, BMI 40-49.9 (morbid obesity) ([place]) 03/29/2020   Chronic respiratory failure with hypoxia ([place]) 03/29/2020   OSA (obstructive sleep apnea) 03/29/2020   Essential hypertension 03/29/2020   Hypothyroidism 03/29/2020   Chest pain 03/29/2020   Elevated troponin 03/29/2020   Chronic diastolic (congestive) heart failure ([place]) 07/12/2018   Type 2 diabetes mellitus without complication ([place]) 48/54/6270   COPD exacerbation ([place]) 07/12/2018   Tobacco use 07/12/2018   Hypotension 07/12/2018   Chronic respiratory failure with hypoxia and hypercapnia ([place]) 07/04/2018   Morbid obesity due to excess calories ([place]) 02/08/2016    Significant Hospital Events   12/4: Admitted to the ICU with acute on chronic hypoxic hypercapnic respiratory failure. 12/5: Patient was extubated but she failed due to increased work of breathing, hypoxia and CO2 retention  Consults:  PCCM  Procedures:  12/4> Intubation >> 12/5 extubation 12/5 reintubation 12/6 titrating down FiO2   Subjective/interval history  Patient started spiking fever, T-max 101.4 FiO2 titrated down to 50%, PEEP remain at 10 Serum creatinine improved after stopping diuretic therapy  OBJECTIVE  Blood pressure (!) 148/54, pulse 72, temperature (!) 101.4 F (38.6 C), temperature source Oral, resp. rate 19, height 5\' 2"  (1.575 m), weight 105.5 kg, SpO2 93 %.    Vent Mode: PRVC FiO2 (%):  [50 %-  55 %] 55 % Set Rate:  [24 bmp] 24 bmp Vt Set:  [400 mL] 400 mL PEEP:  [10 cmH20] 10 cmH20 Plateau Pressure:  [22 cmH20] 22 cmH20   Intake/Output Summary (Last 24 hours) at 04/22/2021 0948 Last data filed at  04/22/2021 0900 Gross per 24 hour  Intake 2073.18 ml  Output 1000 ml  Net 1073.18 ml   Filed Weights   04/20/21 0500 04/21/21 0338 04/22/21 0414  Weight: 110.1 kg 103.5 kg 105.5 kg     Physical Examination    Physical exam: General: Crtitically ill-appearing morbidly obese female, orally intubated HEENT: [place]/AT, eyes anicteric.  ETT and OGT in place Neuro: Sedated, not following commands, eyes closed. Chest: Bilateral inspiratory and expiratory wheezes, No rhonchi or crackles Heart: Regular rate and rhythm, no murmurs or gallops Abdomen: Soft, obese, nondistended, bowel sounds present Skin: No rash  Labs   CBC: Recent Labs  Lab 05/04/2021 0608 04/20/21 0515 04/22/21 0419  WBC 15.4* 8.1 7.8  NEUTROABS 10.9*  --   --   HGB 10.9* 8.6* 9.4*  HCT 36.3 28.3* 31.4*  MCV 91.9 88.7 90.8  PLT 305 251 361    Basic Metabolic Panel: Recent Labs  Lab 04/25/2021 0608 04/20/21 0515 04/21/21 0450 04/22/21 0419  NA 138 143 142 145  K 4.0 4.7 4.7 4.8  CL 94* 103 100 105  CO2 35* 33* 32 33*  GLUCOSE 264* 146* 157* 198*  BUN 25* 35* 53* 53*  CREATININE 1.67* 1.78* 2.19* 1.87*  CALCIUM 9.0 8.9 8.9 8.8*  MG  --  2.3 2.4 2.4  PHOS  --  4.3 6.0* 3.9   GFR: Estimated Creatinine Clearance: 32.9 mL/min (A) (by C-G formula based on SCr of 1.87 mg/dL (H)). Recent Labs  Lab 04/26/2021 0608 04/20/21 0515 04/21/21 0450 04/22/21 0419  PROCALCITON <0.10 0.53 0.43  --   WBC 15.4* 8.1  --  7.8  LATICACIDVEN 1.9  --   --   --     Liver Function Tests: Recent Labs  Lab 05/10/2021 0608 04/22/21 0419  AST 15 15  ALT 11 12  ALKPHOS 44 33*  BILITOT 0.6 0.6  PROT 7.5 6.9  ALBUMIN 3.4* 3.3*   No results for input(s): LIPASE, AMYLASE in the last 168 hours. No results for input(s): AMMONIA in the last 168 hours.  ABG    Component Value Date/Time   PHART 7.36 04/21/2021 0819   PCO2ART 67 (HH) 04/21/2021 0819   PO2ART 196 (H) 04/21/2021 0819   HCO3 37.9 (H) 04/21/2021 0819   O2SAT  99.7 04/21/2021 0819     Coagulation Profile: No results for input(s): INR, PROTIME in the last 168 hours.  Cardiac Enzymes: No results for input(s): CKTOTAL, CKMB, CKMBINDEX, TROPONINI in the last 168 hours.  HbA1C: Hgb A1c MFr Bld  Date/Time Value Ref Range Status  05/10/2021 06:08 AM 6.0 (H) 4.8 - 5.6 % Final    Comment:    (NOTE)         Prediabetes: 5.7 - 6.4         Diabetes: >6.4         Glycemic control for adults with diabetes: <7.0   12/23/2020 05:09 AM 5.4 4.8 - 5.6 % Final    Comment:    (NOTE) Pre diabetes:          5.7%-6.4%  Diabetes:              >6.4%  Glycemic control for   <7.0% adults with diabetes  CBG: Recent Labs  Lab 04/21/21 1538 04/21/21 1912 04/21/21 2342 04/22/21 0353 04/22/21 0741  GLUCAP 150* 171* 128* 183* 191*     Assessment & Plan:  Acute on chronic hypercapnic hypoxic respiratory failure  Acute respiratory acidosis Acute on chronic diastolic CHF exacerbation  Acute COPD exacerbation  OSA  Community-acquired pneumonia with gram-positive cocci  Tobacco dependence Malignant neoplasm of right lung  Continue lung protective ventilation Titrate PEEP & FiO2 as tolerated, maintain SpO2 88-92% Peak and plateau pressures are at goal Hypercapnia has cleared with adjustment of vent setting Repeat ABGs pending Patient looks more of euvolemic now Lasix was stopped considering rising serum creatinine X-ray chest confirmed bilateral interstitial infiltrates Respiratory culture is growing polymicrobial's Antibiotics were switched to Zosyn to cover for HCAP Smoking cessation counseling when able to Outpatient follow-up with oncology Continue budesonide inhaler nebs BID, bronchodilators PRNBronchodilators PRN. Continue IV Solu-Medrol  Paroxysmal atrial fibrillation Patient was in and out of A. fib yesterday Now she is in sinus rhythm with heart rate in 60- 80 Continue amiodarone Continue Eliquis for stroke prophylaxis   Severe  Aortic stenosis (followed by structural clinic not candidate for invasive procedures) Cardiology is following He may be candidate for TAVR depends on her improvement in respiratory status Continue atorvastatin Hold Irbesartan the setting of AKI   AKI on CKD-Stage IIIa Hyperphosphatemia Likely due to cardiorenal syndrome and then aggressive diuresis Serum creatinine trended down to 1.8 from 2.1 after Lasix was stopped Monitor I&O's / urinary output Avoid nephrotoxic agents as able Replace electrolytes as indicated and closely monitor   Diabetes type 2, complicated with diabetic Peripheral Neuropathy Monitor fingerstick with goal 140-180 Continue sliding scale insulin Hold home Metformin & Januvia Continue gabapentin at lower doses to 300 mg twice daily due to AKI  Squamous cell carcinoma right lower lobe (04/2020) status post SBRT CT scan PET scan September 2022  imaging findings highly concerning for recurrent malignancy Per Oncology last visit holding off biopsy given patient's tenuous respiratory status.   On Keytruda IV infusion every 3 weeks  Outpatient follow-up with oncology  Hypothyroidism Continue Synthroid  Anemia of chronic disease Monitor H&H and transfuse if less than 8  Morbid obesity Dietitian follow-up   Best practice:  Diet:  NPO, start tube feeds VAP protocol (if indicated): Yes DVT prophylaxis: Systemic AC GI prophylaxis: PPI Glucose control:  SSI Yes Foley:  Yes, and it is still needed for close monitoring intake and output Mobility:  bed rest  PT consulted: N/A Last date of multidisciplinary goals of care discussion [12/7] Code Status:  full code Disposition: ICU    Primary Emergency Contact: MASHBURN,JUDY A, Home Phone: [phone] Wishes to pursue full aggressive treatment and intervention options, including CPR and intubation.  After speaking with palliative care team patient's daughter has decided not to proceed with tracheostomy.  If she  fails extubation then they would like to proceed with comfort care, will continue aggressive treatment for couple of days before trial of extubation  Critical care time:     Total critical care time: 39 minutes  Performed by: Jacky Kindle   Critical care time was exclusive of separately billable procedures and treating other patients.   Critical care was necessary to treat or prevent imminent or life-threatening deterioration.   Critical care was time spent personally by me on the following activities: development of treatment plan with patient and/or surrogate as well as nursing, discussions with consultants, evaluation of patient's response to treatment, examination of patient, obtaining history  from patient or surrogate, ordering and performing treatments and interventions, ordering and review of laboratory studies, ordering and review of radiographic studies, pulse oximetry and re-evaluation of patient's condition.   Jacky Kindle MD [facility] See Amion for pager If no response to pager, please call [phone] until 7pm After 7pm, Please call E-link [phone]   .

## 2021-04-22 NOTE — Progress Notes (Signed)
Ananias Pilgrim patient daughter called and asked for update on mother.  Patient does not have password setup in computer.  Waynetta Sandy patients other daughter at bedside and also listed as emergency contact.  Per [name] she does not want sister Tammy involved with care and has stated she and patients sister have health care power of attorney.  Waynetta Sandy verbalized paper work is in computer but she will have family member bring copy to hospital.

## 2021-04-22 NOTE — TOC Initial Note (Signed)
Transition of Care [facility]) - Initial/Assessment Note    Patient Details  Name: Kristin [name] MRN: [id number] Date of Birth: 07/17/1950  Transition of Care [facility]) CM/SW Contact:    Shelbie Hutching, RN Phone Number: 04/22/2021, 6:59 PM  Clinical Narrative:                 Patient admitted to the hospital for acute respiratory failure requiring intubation.  Patient has a history of HF, lung cancer and COPD on chronic O2 at 3L through [place].  Patient lives with daughter Kristin [name].  Kristin [name] and patient's sister, Kristin [name], are at the bedside this afternoon.  The family wants to see how she does over the next couple of days but if not improving they know the patient does not want trach or peg or other means to prolong her life unnaturally.  Patient's [birth year] is [birth year].   TOC will follow.    Expected Discharge Plan:  (possible comfort care or hospice) Barriers to Discharge: Continued Medical Work up   Patient Goals and CMS Choice Patient states their goals for this hospitalization and ongoing recovery are:: Family is considering comfort care they want to abide by patient's wishes to not have a trach or be on the vent long term      Expected Discharge Plan and Services Expected Discharge Plan:  (possible comfort care or hospice)   Discharge Planning Services: CM Consult   Living arrangements for the past 2 months: Single Family Home                 DME Arranged: N/A DME Agency: NA       HH Arranged: NA [place] Agency: NA        Prior Living Arrangements/Services Living arrangements for the past 2 months: Single Family Home Lives with:: Adult Children Patient language and need for interpreter reviewed:: Yes Do you feel safe going back to the place where you live?: Yes      Need for Family Participation in Patient Care: Yes (Comment) Care giver support system in place?: Yes (comment) Current home services: DME (oxygen, walker, NIV, nebulizer) Criminal Activity/Legal Involvement Pertinent to  Current Situation/Hospitalization: No - Comment as needed  Activities of Daily Living Home Assistive Devices/Equipment: CPAP ADL Screening (condition at time of admission) Patient's cognitive ability adequate to safely complete daily activities?: No Is the patient deaf or have difficulty hearing?: No Does the patient have difficulty seeing, even when wearing glasses/contacts?: No Does the patient have difficulty concentrating, remembering, or making decisions?: Yes Patient able to express need for assistance with ADLs?: No Does the patient have difficulty dressing or bathing?: Yes Independently performs ADLs?: No Communication: Dependent Is this a change from baseline?: Change from baseline, expected to last <3 days Dressing (OT): Dependent Is this a change from baseline?: Change from baseline, expected to last <3days Grooming: Dependent Is this a change from baseline?: Change from baseline, expected to last <3 days Feeding: Dependent Is this a change from baseline?: Change from baseline, expected to last <3 days Bathing: Dependent Is this a change from baseline?: Change from baseline, expected to last <3 days Toileting: Dependent Is this a change from baseline?: Change from baseline, expected to last <3 days In/Out Bed: Dependent Is this a change from baseline?: Change from baseline, expected to last <3 days Walks in Home: Independent Does the patient have difficulty walking or climbing stairs?: Yes Weakness of Legs: Both Weakness of Arms/Hands: Both  Permission Sought/Granted Permission sought to share information with :  Case Manager, Family Supports Permission granted to share information with : Yes, Verbal Permission Granted  Share Information with NAME: Waynetta Sandy     Permission granted to share info w Relationship: daughter  Permission granted to share info w Contact Information: [phone]  Emotional Assessment Appearance:: Appears stated  age Attitude/Demeanor/Rapport: Unable to Assess Affect (typically observed): Unable to Assess   Alcohol / Substance Use: Not Applicable Psych Involvement: No (comment)  Admission diagnosis:  Acute respiratory failure (HCC) [J96.00] Acute respiratory failure with hypoxia (HCC) [J96.01] Patient Active Problem List   Diagnosis Date Noted   Severe aortic stenosis    Acute respiratory failure ([place]) 05/10/2021   Primary cancer of right lower lobe of lung ([place]) 02/20/2021   COPD GOLD ? 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   Weakness    Acute hypoxemic respiratory failure ([place]) 12/23/2020   Acute exacerbation of chronic obstructive pulmonary disease (COPD) ([place]) 12/22/2020   Aspiration into airway    Paroxysmal atrial fibrillation (HCC)    CAD (coronary artery disease) 09/26/2020   Hyperkalemia 09/26/2020   CHF exacerbation ([place]) 09/26/2020   Acute on chronic respiratory failure with hypoxia and hypercapnia ([place]) 09/25/2020   Bradycardia 09/25/2020   Lung cancer ([place]) 05/26/2020   Atrial flutter with rapid ventricular response ([place]) 05/26/2020   Demand ischemia ([place])    Acute on chronic heart failure with preserved ejection fraction (HFpEF) ([place])    Aortic stenosis-moderate to severe 03/2020    Obesity, Class III, BMI 40-49.9 (morbid obesity) ([place]) 03/29/2020   Chronic respiratory failure with hypoxia ([place]) 03/29/2020   OSA (obstructive sleep apnea) 03/29/2020   Essential hypertension 03/29/2020   Hypothyroidism 03/29/2020   Chest pain 03/29/2020   Elevated troponin 03/29/2020   Chronic diastolic (congestive) heart failure ([place]) 07/12/2018   Type 2 diabetes mellitus without complication ([place]) 02/72/5366   COPD exacerbation ([place]) 07/12/2018   Tobacco use 07/12/2018    Hypotension 07/12/2018   Chronic respiratory failure with hypoxia and hypercapnia ([place]) 07/04/2018   Morbid obesity due to excess calories ([place]) 02/08/2016   PCP:  [facility] Pharmacy:   [id number] - [place], [address] [address] Phone: [phone] Fax: [phone]     Social Determinants of Health (SDOH) Interventions    Readmission Risk Interventions Readmission Risk Prevention Plan 04/22/2021 01/19/2021 01/02/2021  Transportation Screening Complete Complete Complete  Medication Review Press photographer) Complete Complete Complete  PCP or Specialist appointment within 3-5 days of discharge Complete Complete Complete  HRI or Home Care Consult Complete Complete Complete  SW Recovery Care/Counseling Consult Complete Complete Complete  Palliative Care Screening Complete Not Applicable Not [employer] Not Applicable Not Applicable Not Applicable  Some recent data might be hidden

## 2021-04-22 NOTE — Consult Note (Signed)
Pharmacy Antibiotic Note  Kristin [name] is a 70 y.o. female admitted on 04/24/2021 with  acute on chronic respiratory failure requiring intubation and mechanical ventilation . Patient was initially intubated 12/4 and extubated 12/5. However, she failed BiPAP and was re-intubated 12/6. Patient has been on ceftriaxone + azithromycin for three days, however, new fever to 101.90F on AM of 12/7. Pharmacy has been consulted for Zosyn dosing.  Plan: Zosyn 3.375g IV q8h (4 hour infusion).  Height: 5\' 2"  (157.5 cm) Weight: 105.5 kg (232 lb 9.4 oz) IBW/kg (Calculated) : 50.1  Temp (24hrs), Avg:100.2 F (37.9 C), Min:99.1 F (37.3 C), Max:101.4 F (38.6 C)  Recent Labs  Lab 05/13/2021 0608 04/20/21 0515 04/21/21 0450 04/22/21 0419  WBC 15.4* 8.1  --  7.8  CREATININE 1.67* 1.78* 2.19* 1.87*  LATICACIDVEN 1.9  --   --   --     Estimated Creatinine Clearance: 32.9 mL/min (A) (by C-G formula based on SCr of 1.87 mg/dL (H)).    Allergies  Allergen Reactions   Altace [Ramipril] Swelling    Antimicrobials this admission: Azithromycin 12/4 >> 12/6 Ceftriaxone 12/4 >> 12/6 Zosyn 12/7 >>   Dose adjustments this admission: N/A  Microbiology results: 12/4 BCx: NGTD 12/4 MRSA PCR: (-) 12/4 Sputum: NF 12/6 Sputum: Pending   Thank you for allowing pharmacy to be a part of this patient's care.  Benita Gutter 04/22/2021 9:30 AM

## 2021-04-22 NOTE — Consult Note (Signed)
PHARMACY CONSULT NOTE  Pharmacy Consult for Electrolyte Monitoring and Replacement   Recent Labs: Potassium (mmol/L)  Date Value  04/22/2021 4.8   Magnesium (mg/dL)  Date Value  04/22/2021 2.4   Calcium (mg/dL)  Date Value  04/22/2021 8.8 (L)   Albumin (g/dL)  Date Value  04/22/2021 3.3 (L)   Phosphorus (mg/dL)  Date Value  04/22/2021 3.9   Sodium (mmol/L)  Date Value  04/22/2021 145   Assessment: Patient is a 70 y/o F with medical history including HFpEF, AS, Afib, HTN, chronic respiratory failure, tobacco abuse, COPD, OSA, hypothyroidism, diabetes, anxiety, GERD, lung cancer who is admitted with acute on chronic respiratory failure requiring intubation and mechanical ventilation. Pharmacy consulted to assist with electrolyte replacement as indicated.   Patient extubated 12/5. Failed BiPAP. Ultimately re-intubated 12/6  Goal of Therapy:  Electrolytes within normal limits  Plan:  --No electrolyte replacement indicated at this time. Scr improving with IV Lasix on hold --Follow-up electrolytes with AM labs tomorrow  Kristin [name] 04/22/2021 8:24 AM

## 2021-04-22 NOTE — Progress Notes (Addendum)
Progress Note  Patient Name: Kristin [name] Date of Encounter: 04/22/2021  [facility] Cardiologist: Ida Rogue, MD   Subjective   Intubated, sedated.  Failed extubation yesterday, reintubated due to increased work of breathing.  Inpatient Medications    Scheduled Meds:  amiodarone  200 mg Per Tube Daily   apixaban  5 mg Per Tube BID   arformoterol  15 mcg Nebulization BID   atorvastatin  80 mg Per Tube QHS   budesonide (PULMICORT) nebulizer solution  0.25 mg Nebulization BID   chlorhexidine gluconate (MEDLINE KIT)  15 mL Mouth Rinse BID   Chlorhexidine Gluconate Cloth  6 each Topical Q0600   docusate  100 mg Per Tube BID   ezetimibe  10 mg Per Tube Daily   famotidine  20 mg Per Tube BID   free water  30 mL Per Tube Q4H   gabapentin  300 mg Per Tube BID   insulin aspart  0-15 Units Subcutaneous Q4H   ipratropium-albuterol  3 mL Nebulization Q4H   levothyroxine  75 mcg Per Tube Q0600   mouth rinse  15 mL Mouth Rinse 10 times per day   methylPREDNISolone (SOLU-MEDROL) injection  40 mg Intravenous Q12H   multivitamin  15 mL Per Tube Daily   polyethylene glycol  17 g Per Tube Daily   revefenacin  175 mcg Nebulization Daily   Continuous Infusions:  dexmedetomidine (PRECEDEX) IV infusion 1.2 mcg/kg/hr (04/22/21 1418)   feeding supplement (VITAL HIGH PROTEIN) 1,000 mL (04/22/21 1425)   fentaNYL infusion INTRAVENOUS 200 mcg/hr (04/22/21 1400)   piperacillin-tazobactam (ZOSYN)  IV 12.5 mL/hr at 04/22/21 1400   PRN Meds: acetaminophen, docusate, fentaNYL, hydrOXYzine, LORazepam, polyethylene glycol   Vital Signs    Vitals:   04/22/21 1300 04/22/21 1400 04/22/21 1418 04/22/21 1428  BP: (!) 146/58 (!) 189/69  (!) 166/61  Pulse: 77 76  74  Resp: (!) 24 18  (!) 26  Temp:   (!) 100.8 F (38.2 C)   TempSrc:   Oral   SpO2: 93% 97%  95%  Weight:      Height:        Intake/Output Summary (Last 24 hours) at 04/22/2021 1512 Last data filed at 04/22/2021 1425 Gross per  24 hour  Intake 1991.7 ml  Output 1625 ml  Net 366.7 ml   Last 3 Weights 04/22/2021 04/21/2021 04/20/2021  Weight (lbs) 232 lb 9.4 oz 228 lb 2.8 oz 242 lb 11.6 oz  Weight (kg) 105.5 kg 103.5 kg 110.1 kg      Telemetry     - Personally Reviewed  ECG     - Personally Reviewed  Physical Exam   GEN: Intubated, sedated Neck: No JVD Cardiac: Regular, occasional skipped heartbeats Respiratory: Vented breath sounds GI: Soft, nontender, non-distended  MS: No edema; No deformity. Neuro:  Nonfocal  Psych: Normal affect   Labs    High Sensitivity Troponin:   Recent Labs  Lab 04/25/2021 0608 04/18/2021 1207  TROPONINIHS 12 17     Chemistry Recent Labs  Lab 05/14/2021 0608 04/20/21 0515 04/21/21 0450 04/22/21 0419  NA 138 143 142 145  K 4.0 4.7 4.7 4.8  CL 94* 103 100 105  CO2 35* 33* 32 33*  GLUCOSE 264* 146* 157* 198*  BUN 25* 35* 53* 53*  CREATININE 1.67* 1.78* 2.19* 1.87*  CALCIUM 9.0 8.9 8.9 8.8*  MG  --  2.3 2.4 2.4  PROT 7.5  --   --  6.9  ALBUMIN 3.4*  --   --  3.3*  AST 15  --   --  15  ALT 11  --   --  12  ALKPHOS 44  --   --  33*  BILITOT 0.6  --   --  0.6  GFRNONAA 33* 31* 24* 29*  ANIONGAP '9 7 10 7    ' Lipids  Recent Labs  Lab 04/20/21 0515  TRIG 145    Hematology Recent Labs  Lab 05/12/2021 0608 04/20/21 0515 04/22/21 0419  WBC 15.4* 8.1 7.8  RBC 3.95 3.19* 3.46*  HGB 10.9* 8.6* 9.4*  HCT 36.3 28.3* 31.4*  MCV 91.9 88.7 90.8  MCH 27.6 27.0 27.2  MCHC 30.0 30.4 29.9*  RDW 13.7 14.0 14.2  PLT 305 251 254   Thyroid  Recent Labs  Lab 04/18/2021 1207  TSH 0.985    BNP Recent Labs  Lab 04/23/2021 0608  BNP 245.2*    DDimer No results for input(s): DDIMER in the last 168 hours.   Radiology    DG Chest 1 View  Result Date: 04/21/2021 CLINICAL DATA:  Respiratory failure EXAM: CHEST  1 VIEW COMPARISON:  04/23/2021 FINDINGS: Endotracheal tube seen 3.9 cm above the carina. Nasogastric tube extends into the upper abdomen beyond the margin of  the examination. The lungs are symmetrically well expanded. Cardiac size is mildly enlarged. Central pulmonary vascular enlargement and trace interstitial pulmonary edema persists, in keeping with mild cardiogenic failure. No pneumothorax or pleural effusion. Cardiac size within normal limits. IMPRESSION: Support tubes in appropriate position. Mild cardiogenic failure.  Stable cardiomegaly. Preserved pulmonary insufflation. Electronically Signed   By: Fidela Salisbury M.D.   On: 04/21/2021 02:16   DG Chest 1 View  Result Date: 04/21/2021 CLINICAL DATA:  Respiratory failure EXAM: CHEST  1 VIEW COMPARISON:  04/16/2021 FINDINGS: Interval extubation. Pulmonary insufflation remain stable of the lungs are symmetrically well expanded. Superimposed interstitial thickening is unchanged, likely chronic in nature. No superimposed focal pulmonary infiltrate. No pneumothorax or pleural effusion. Cardiac size is within normal limits. Remote right proximal humeral fracture again noted. IMPRESSION: Interval extubation with preservation of pulmonary insufflation. Electronically Signed   By: Fidela Salisbury M.D.   On: 04/21/2021 01:33   DG Abd 1 View  Result Date: 04/21/2021 CLINICAL DATA:  OG tube EXAM: ABDOMEN - 1 VIEW COMPARISON:  04/21/2021 FINDINGS: Interval advancement of esophageal tube, the tip overlies the 2/3 portion of duodenum. Mild air distension of central bowel. IMPRESSION: Esophageal tube tip overlies the duodenum Electronically Signed   By: Donavan Foil M.D.   On: 04/21/2021 16:21   DG Abd 1 View  Result Date: 04/21/2021 CLINICAL DATA:  OG placement. EXAM: ABDOMEN - 1 VIEW COMPARISON:  None. FINDINGS: Orogastric tube courses below the diaphragm with the tip in the proximal stomach. The side port is located just above the gastroesophageal junction. Lung bases further characterized on same day chest radiographs. Nonobstructive bowel gas pattern on limited visualization/assessment of the abdomen. Degenerative  changes of the spine. IMPRESSION: Orogastric tube courses below the diaphragm with the tip in the proximal stomach. The side port is located just above the gastroesophageal junction. If intragastric location is desired, recommend advancement. Electronically Signed   By: Margaretha Sheffield M.D.   On: 04/21/2021 13:32   DG Abd 1 View  Result Date: 04/21/2021 CLINICAL DATA:  Orogastric tube placement EXAM: ABDOMEN - 1 VIEW COMPARISON:  04/18/2021 FINDINGS: The orogastric tube is been withdrawn and its proximal side hole is now seen at the gastroesophageal junction. Visualized abdominal gas  pattern is nonobstructive. IMPRESSION: Withdrawal of the nasogastric tube with its tip now just beyond the gastroesophageal junction. Advancement of the catheter by 10-15 cm would more optimally position the catheter. Electronically Signed   By: Fidela Salisbury M.D.   On: 04/21/2021 02:14   DG Chest Port 1 View  Result Date: 04/22/2021 CLINICAL DATA:  Respiratory distress, hypoxia EXAM: PORTABLE CHEST 1 VIEW COMPARISON:  04/21/2021 FINDINGS: No significant change in rotated AP portable examination. Support apparatus including endotracheal tube and esophagogastric tube appropriately positioned. Cardiomegaly. Mild, diffuse interstitial opacity, unchanged. IMPRESSION: 1. No significant change in rotated AP portable examination. 2. Support apparatus including endotracheal tube and esophagogastric tube appropriately positioned. 3. Mild, diffuse bilateral interstitial pulmonary opacity, unchanged, and consistent with edema or infection. 4. Cardiomegaly. Electronically Signed   By: Delanna Ahmadi M.D.   On: 04/22/2021 12:36    Cardiac Studies   TTE 04/16/2021 1. Left ventricular ejection fraction, by estimation, is 50 to 55%. The  left ventricle has low normal function. The left ventricle has no regional  wall motion abnormalities. Left ventricular diastolic parameters are  consistent with Grade II diastolic  dysfunction  (pseudonormalization).   2. Right ventricular systolic function is low normal. The right  ventricular size is not well visualized.   3. Left atrial size was moderately dilated.   4. The mitral valve is degenerative. No evidence of mitral valve  regurgitation. Moderate mitral annular calcification.   5. Aortic valve mean gradient = 63mHg, peak gradient 676mg, Vmax  3.85m94m DVI= 0.23, AVA 0.7cm2. . The aortic valve is calcified. Aortic  valve regurgitation is not visualized. Severe aortic valve stenosis.   6. The inferior vena cava is normal in size with greater than 50%  respiratory variability, suggesting right atrial pressure of 3 mmHg.   Patient Profile     [age]o. female with history of COPD, lung cancer s/p radiation therapy, severe AAS, paroxysmal atrial fibrillation, nonobstructive CAD presenting with shortness of breath diagnosed with respiratory failure requiring intubation  Assessment & Plan    Severe AS -TAVR consideration as outpatient after resolution of acute illness -Marginal candidate for TAVR.  2.  Paroxysmal atrial fibrillation -Maintaining sinus rhythm on amiodarone -Continue p.o. amio 200 mg daily -Eliquis 5 mg twice daily  3.  COPD, respiratory failure -Intubation, vent management as per ICU team  Patient's overall prognosis remains guarded.  Continue goals of care discussion with family.  Total encounter time 35 minutes  Greater than 50% was spent in counseling and coordination of care with the patient     Signed, BriKate SableD  04/22/2021, 3:12 PM

## 2021-04-22 NOTE — Progress Notes (Signed)
When attempting to do patient care and give bath patient reaching up for tube and swatting at staff.  Nurse and tech unable to calm patient and complete patient care.  Ativan 2mg  IV given for anxiety.

## 2021-04-22 NOTE — Progress Notes (Signed)
PHARMACIST - PHYSICIAN COMMUNICATION  CONCERNING: IV to Oral Route Change Policy  RECOMMENDATION: This patient is receiving famotidine by the intravenous route.  Based on criteria approved by the Pharmacy and Therapeutics Committee, the intravenous medication(s) is/are being converted to the equivalent oral dose form(s).   DESCRIPTION: These criteria include: The patient is eating (either orally or via tube) and/or has been taking other orally administered medications for a least 24 hours The patient has no evidence of active gastrointestinal bleeding or impaired GI absorption (gastrectomy, short bowel, patient on TNA or NPO).  If you have questions about this conversion, please contact the [place], [facility] 04/22/2021 11:13 AM

## 2021-04-22 NOTE — Progress Notes (Signed)
02 SATs maintaining 85-86%.  Patient with noted sinus arrhythmia with heart rate increasing to 100-120. Patient with noted abnormal breath sounds to left lung.  This nurse gave patient 100% and suctioned patient with no improvement. Dr. Tacy Learn notified and at bedside.  STAT CXR order placed.

## 2021-04-22 NOTE — Progress Notes (Signed)
Patient reaching up for ETT and restless in bed.  Ativan 2mg  IV administered

## 2021-04-22 NOTE — Progress Notes (Signed)
Tylenol 650mg  given via OG tube for oral temperature of 101.

## 2021-04-22 NOTE — Progress Notes (Signed)
Tylenol 650 mg given via OG for oral temperature of 103.2

## 2021-04-23 ENCOUNTER — Inpatient Hospital Stay: Payer: Medicare Other

## 2021-04-23 DIAGNOSIS — R579 Shock, unspecified: Secondary | ICD-10-CM

## 2021-04-23 DIAGNOSIS — Z978 Presence of other specified devices: Secondary | ICD-10-CM

## 2021-04-23 DIAGNOSIS — A419 Sepsis, unspecified organism: Secondary | ICD-10-CM

## 2021-04-23 DIAGNOSIS — R6521 Severe sepsis with septic shock: Secondary | ICD-10-CM

## 2021-04-23 DIAGNOSIS — J9622 Acute and chronic respiratory failure with hypercapnia: Principal | ICD-10-CM

## 2021-04-23 LAB — BASIC METABOLIC PANEL
Anion gap: 7 (ref 5–15)
Anion gap: 5 (ref 5–15)
BUN: 46 mg/dL — ABNORMAL HIGH (ref 8–23)
BUN: 50 mg/dL — ABNORMAL HIGH (ref 8–23)
CO2: 34 mmol/L — ABNORMAL HIGH (ref 22–32)
CO2: 36 mmol/L — ABNORMAL HIGH (ref 22–32)
Calcium: 9 mg/dL (ref 8.9–10.3)
Calcium: 8.9 mg/dL (ref 8.9–10.3)
Chloride: 104 mmol/L (ref 98–111)
Chloride: 104 mmol/L (ref 98–111)
Creatinine, Ser: 1.54 mg/dL — ABNORMAL HIGH (ref 0.44–1.00)
Creatinine, Ser: 1.57 mg/dL — ABNORMAL HIGH (ref 0.44–1.00)
GFR, Estimated: 37 mL/min — ABNORMAL LOW (ref >60)
GFR, Estimated: 36 mL/min — ABNORMAL LOW (ref >60)
Glucose, Bld: 226 mg/dL — ABNORMAL HIGH (ref 70–99)
Glucose, Bld: 182 mg/dL — ABNORMAL HIGH (ref 70–99)
Potassium: 4.9 mmol/L (ref 3.5–5.1)
Potassium: 4.9 mmol/L (ref 3.5–5.1)
Sodium: 145 mmol/L (ref 135–145)
Sodium: 145 mmol/L (ref 135–145)

## 2021-04-23 LAB — POTASSIUM: Potassium: 4.7 mmol/L (ref 3.5–5.1)

## 2021-04-23 LAB — GLUCOSE, CAPILLARY
Glucose-Capillary: 139 mg/dL — ABNORMAL HIGH (ref 70–99)
Glucose-Capillary: 147 mg/dL — ABNORMAL HIGH (ref 70–99)
Glucose-Capillary: 151 mg/dL — ABNORMAL HIGH (ref 70–99)
Glucose-Capillary: 173 mg/dL — ABNORMAL HIGH (ref 70–99)
Glucose-Capillary: 214 mg/dL — ABNORMAL HIGH (ref 70–99)
Glucose-Capillary: 214 mg/dL — ABNORMAL HIGH (ref 70–99)
Glucose-Capillary: 228 mg/dL — ABNORMAL HIGH (ref 70–99)
Glucose-Capillary: 80 mg/dL (ref 70–99)

## 2021-04-23 LAB — MAGNESIUM
Magnesium: 2.2 mg/dL (ref 1.7–2.4)
Magnesium: 2.2 mg/dL (ref 1.7–2.4)

## 2021-04-23 LAB — TROPONIN I (HIGH SENSITIVITY)
Troponin I (High Sensitivity): 22 ng/L — ABNORMAL HIGH (ref <18)
Troponin I (High Sensitivity): 27 ng/L — ABNORMAL HIGH (ref <18)

## 2021-04-23 MED ORDER — PROPOFOL 1000 MG/100ML IV EMUL
0.0000 ug/kg/min | INTRAVENOUS | Status: DC
  Administered 2021-04-23 (×2): 20 ug/kg/min via INTRAVENOUS
  Administered 2021-04-23: 10 ug/kg/min via INTRAVENOUS
  Administered 2021-04-24 – 2021-04-25 (×5): 20 ug/kg/min via INTRAVENOUS
  Filled 2021-04-23 (×7): qty 100

## 2021-04-23 MED ORDER — NOREPINEPHRINE 4 MG/250ML-% IV SOLN
2.0000 ug/min | INTRAVENOUS | Status: DC
  Administered 2021-04-23: 10 ug/min via INTRAVENOUS

## 2021-04-23 MED ORDER — METOPROLOL TARTRATE 5 MG/5ML IV SOLN
5.0000 mg | Freq: Once | INTRAVENOUS | Status: AC
Start: 2021-04-23 — End: 2021-04-23
  Administered 2021-04-23: 5 mg via INTRAVENOUS

## 2021-04-23 MED ORDER — DILTIAZEM HCL-DEXTROSE 125-5 MG/125ML-% IV SOLN (PREMIX)
5.0000 mg/h | INTRAVENOUS | Status: DC
  Administered 2021-04-23: 5 mg/h via INTRAVENOUS
  Filled 2021-04-23: qty 125

## 2021-04-23 MED ORDER — AMIODARONE IV BOLUS ONLY 150 MG/100ML
INTRAVENOUS | Status: AC
  Filled 2021-04-23: qty 100

## 2021-04-23 MED ORDER — ADENOSINE 12 MG/4ML IV SOLN
6.0000 mg | Freq: Once | INTRAVENOUS | Status: AC
Start: 2021-04-23 — End: 2021-04-23
  Administered 2021-04-23: 6 mg via INTRAVENOUS

## 2021-04-23 MED ORDER — METOPROLOL TARTRATE 5 MG/5ML IV SOLN
INTRAVENOUS | Status: AC
  Filled 2021-04-23: qty 5

## 2021-04-23 MED ORDER — SODIUM CHLORIDE 0.9 % IV SOLN
250.0000 mL | INTRAVENOUS | Status: DC
  Administered 2021-04-27: 250 mL via INTRAVENOUS

## 2021-04-23 MED ORDER — ADENOSINE 12 MG/4ML IV SOLN
INTRAVENOUS | Status: AC
  Filled 2021-04-23: qty 4

## 2021-04-23 MED ORDER — AMIODARONE IV BOLUS ONLY 150 MG/100ML
150.0000 mg | Freq: Once | INTRAVENOUS | Status: AC
  Administered 2021-04-23: 150 mg via INTRAVENOUS

## 2021-04-23 MED ORDER — PHENYLEPHRINE HCL-NACL 20-0.9 MG/250ML-% IV SOLN
25.0000 ug/min | INTRAVENOUS | Status: DC
Start: 2021-04-23 — End: 2021-04-25
  Administered 2021-04-23: 25 ug/min via INTRAVENOUS
  Administered 2021-04-24: 45 ug/min via INTRAVENOUS
  Filled 2021-04-23 (×4): qty 250

## 2021-04-23 NOTE — Progress Notes (Signed)
Sedation stopped this AM. Pt opens eyes to voice, intermittently nods appropriately, remained very drowsy.   Near 1115, HR increased to 180s-200s.     Per Dr. Tacy Learn, 6 mg Adenosine given and 5 mg Metoprolol given w/o effect.   150 mg bolus of Amio infused w/ minimal effects.   Pt became increasingly hypotensive. Levo gtt started and central line placed @ bedside. Levo gtt weaned from 9 to 4.  Dilt gtt order placed by Dr. Rockey Situ, however pt converted to SB/SR. Dilt gtt held for now. Dr. Rockey Situ notified via message.   Foley remains in place, putting out adequate urine.    Throughout entire shift, pt having increased arrythmias/pauses. MD aware. Pt back on vent rate.   Daughter @ bedside, multiple family members visiting w/ permission from [place].   SCDs on. Tube feeds resumed this evening.

## 2021-04-23 NOTE — Progress Notes (Addendum)
Patient went into SVT with heart rate ranging in the 190s. She was given 6 mg of IV adenosine without improvement in heart rate, she had a stable blood pressure, she was given 5 mg of IV metoprolol with improvement in heart rate to 140s but not persistently remains elevated without further improvement. She was given 150 mg of IV amiodarone bolus with improvement in heart rate to 110 - 120, she had 1 episode of severe bradycardia heart rate dropping down to 40s far less than 10-second.  We will continue with IV amiodarone bolus as patient is back in A. fib with RVR, patient blood pressure dropped to MAP of 34, started on IV levophed, IO was unavailable, so CVC was placed in L IJ   Additional critical care time: 50 minutes  Performed by: [place] care time was exclusive of separately billable procedures and treating other patients.   Critical care was necessary to treat or prevent imminent or life-threatening deterioration.   Critical care was time spent personally by me on the following activities: development of treatment plan with patient and/or surrogate as well as nursing, discussions with consultants, evaluation of patient's response to treatment, examination of patient, obtaining history from patient or surrogate, ordering and performing treatments and interventions, ordering and review of laboratory studies, ordering and review of radiographic studies, pulse oximetry and re-evaluation of patient's condition.   Jacky Kindle MD [facility] See Amion for pager If no response to pager, please call [phone] until 7pm After 7pm, Please call E-link [phone]

## 2021-04-23 NOTE — Progress Notes (Signed)
Palliative: Kristin [name] is lying quietly in bed.  She is intubated/ventilated and sedated.  There is no family at bedside at this time.  Conference with CCM attending related to patient condition, needs, goals of care.  CCM had discussions with daughter/HC POA, Kristin [name], this morning.  At this point, continue to treat the treatable, time for outcomes. PMT to shadow.  Plan: Continue full scope/full code, time for outcomes.  No charge Kristin Axe, NP Palliative medicine team Team phone [phone] Greater than 50% of this time was spent counseling and coordinating care related to the above assessment and plan.

## 2021-04-23 NOTE — Progress Notes (Addendum)
PCCM Brief Progress Note  S: Notified of ongoing HR 130s on levo 4 mcg/min, dilt 12.5 mg/hr and stable fentanyl/propofol infusions, but now more frequent pauses and hypotension with need for rapid titration of levo.   O: Intubated, RASS -2 Mech breath sounds, equal chest rise Ext lukewarm  A/P: # shock # atrial flutter/RVR # post-conversion pauses # sinus bradycardia  - titrated off levo, dilt - favor neo in setting severe AS will see if higher map will help perfuse conduction system with less irritability than levo - recheck electrolytes - arterial line placed - discussed with daughter if we're heading toward transcutaneous pacing and can't quickly figure out a way to liberate her from it quickly then would recommend transition to comfort measures   Patient critically ill due to shock, unstable bradyarrhythmia Interventions to address this today titration of vasopressors and neg chronotrope Risk of deterioration without these interventions is high  I personally spent 32 minutes providing critical care not including any separately billable procedures   [place]

## 2021-04-23 NOTE — Progress Notes (Signed)
MD at bedside placing CL for pressors.  RN verified; IVT consult cleared.

## 2021-04-23 NOTE — Consult Note (Signed)
PHARMACY CONSULT NOTE  Pharmacy Consult for Electrolyte Monitoring and Replacement   Recent Labs: Potassium (mmol/L)  Date Value  04/23/2021 4.9   Magnesium (mg/dL)  Date Value  04/22/2021 2.4   Calcium (mg/dL)  Date Value  04/23/2021 9.0   Albumin (g/dL)  Date Value  04/22/2021 3.3 (L)   Phosphorus (mg/dL)  Date Value  04/22/2021 3.9   Sodium (mmol/L)  Date Value  04/23/2021 145   Assessment: Patient is a 70 y/o F with medical history including HFpEF, AS, Afib, HTN, chronic respiratory failure, tobacco abuse, COPD, OSA, hypothyroidism, diabetes, anxiety, GERD, lung cancer who is admitted with acute on chronic respiratory failure requiring intubation and mechanical ventilation. Pharmacy consulted to assist with electrolyte replacement as indicated.   Patient extubated 12/5. Failed BiPAP. Ultimately re-intubated 12/6  Goal of Therapy:  Electrolytes within normal limits  Plan:  --No electrolyte replacement indicated at this time. Scr improving with IV Lasix on hold --Follow-up electrolytes with AM labs tomorrow  Benita Gutter 04/23/2021 7:59 AM

## 2021-04-23 NOTE — Progress Notes (Signed)
Progress Note  Patient Name: Kristin [name] Date of Encounter: 04/23/2021  [facility] Cardiologist: Ida Rogue, MD   Subjective   Intubated, sedated Rapid atrial fibrillation this morning Notes reviewed, given adenosine, metoprolol IV push no improvement in rate Given amiodarone 150 mg bolus, temporary improvement in rate, termination pauses to sinus rhythm Normal sinus rhythm did not hold, back to atrial fibrillation with rapid rate Telemetry reviewed, frequent at least 4-5 episodes of termination pauses (impressively long), back to atrial fibrillation with RVR Amiodarone infusion held Currently rate 120 up to 150 bpm  Long discussion with family at the bedside, they are concerned about any regression in her cancer Was scheduled to receive additional cancer treatment this past week, this was delayed given she was in the hospital  Inpatient Medications    Scheduled Meds:  adenosine       amiodarone  200 mg Per Tube Daily   apixaban  5 mg Per Tube BID   arformoterol  15 mcg Nebulization BID   atorvastatin  80 mg Per Tube QHS   budesonide (PULMICORT) nebulizer solution  0.25 mg Nebulization BID   chlorhexidine gluconate (MEDLINE KIT)  15 mL Mouth Rinse BID   Chlorhexidine Gluconate Cloth  6 each Topical Q0600   docusate  100 mg Per Tube BID   ezetimibe  10 mg Per Tube Daily   famotidine  20 mg Per Tube BID   free water  30 mL Per Tube Q4H   gabapentin  300 mg Per Tube BID   insulin aspart  0-15 Units Subcutaneous Q4H   levothyroxine  75 mcg Per Tube Q0600   mouth rinse  15 mL Mouth Rinse 10 times per day   methylPREDNISolone (SOLU-MEDROL) injection  40 mg Intravenous Q12H   metoprolol tartrate       multivitamin  15 mL Per Tube Daily   polyethylene glycol  17 g Per Tube Daily   revefenacin  175 mcg Nebulization Daily   Continuous Infusions:  sodium chloride Stopped (04/23/21 1201)   amiodarone Stopped (04/23/21 1140)   feeding supplement (VITAL HIGH PROTEIN)  1,000 mL (04/22/21 1425)   fentaNYL infusion INTRAVENOUS 100 mcg/hr (04/23/21 1338)   norepinephrine (LEVOPHED) Adult infusion 4 mcg/min (04/23/21 1338)   phenylephrine (NEO-SYNEPHRINE) Adult infusion Stopped (04/23/21 1302)   piperacillin-tazobactam (ZOSYN)  IV Stopped (04/23/21 1317)   propofol (DIPRIVAN) infusion 20 mcg/kg/min (04/23/21 1301)   PRN Meds: acetaminophen, docusate, fentaNYL, hydrOXYzine, ipratropium-albuterol, LORazepam, polyethylene glycol   Vital Signs    Vitals:   04/23/21 1515 04/23/21 1524 04/23/21 1530 04/23/21 1545  BP: 95/67  92/60 (!) 111/57  Pulse: (!) 35 (!) 117 65 (!) 137  Resp: (!) 24 (!) 24 (!) 24 (!) 24  Temp:    (!) 100.4 F (38 C)  TempSrc:    Oral  SpO2: 98% 98% 95% 93%  Weight:      Height:        Intake/Output Summary (Last 24 hours) at 04/23/2021 1630 Last data filed at 04/23/2021 1500 Gross per 24 hour  Intake 1316.34 ml  Output 2180 ml  Net -863.66 ml   Last 3 Weights 04/23/2021 04/22/2021 04/21/2021  Weight (lbs) 242 lb 11.6 oz 232 lb 9.4 oz 228 lb 2.8 oz  Weight (kg) 110.1 kg 105.5 kg 103.5 kg      Telemetry    Atrial fibrillation with RVR rate 1 20-1 50- Personally Reviewed  ECG     - Personally Reviewed  Physical Exam  GEN: Intubated sedated Neck: Unable to estimate JVD Cardiac: Irregularly irregular, rapid no murmurs, rubs, or gallops.  Respiratory: Coarse breath sounds bilaterally GI: Soft, nontender MS: No edema; No deformity. Neuro: Grossly nonfocal , full exam not performed Psych: Unable to assess  Labs    High Sensitivity Troponin:   Recent Labs  Lab 05/07/2021 0608 04/28/2021 1207 04/23/21 1222 04/23/21 1418  TROPONINIHS 12 17 22* 27*     Chemistry Recent Labs  Lab 04/23/2021 0608 04/20/21 0515 04/21/21 0450 04/22/21 0419 04/23/21 0245 04/23/21 1222  NA 138   < > 142 145 145  --   K 4.0   < > 4.7 4.8 4.9 4.7  CL 94*   < > 100 105 104  --   CO2 35*   < > 32 33* 34*  --   GLUCOSE 264*   < > 157*  198* 226*  --   BUN 25*   < > 53* 53* 46*  --   CREATININE 1.67*   < > 2.19* 1.87* 1.54*  --   CALCIUM 9.0   < > 8.9 8.8* 9.0  --   MG  --    < > 2.4 2.4  --  2.2  PROT 7.5  --   --  6.9  --   --   ALBUMIN 3.4*  --   --  3.3*  --   --   AST 15  --   --  15  --   --   ALT 11  --   --  12  --   --   ALKPHOS 44  --   --  33*  --   --   BILITOT 0.6  --   --  0.6  --   --   GFRNONAA 33*   < > 24* 29* 37*  --   ANIONGAP 9   < > '10 7 7  ' --    < > = values in this interval not displayed.    Lipids  Recent Labs  Lab 04/20/21 0515  TRIG 145    Hematology Recent Labs  Lab 05/10/2021 0608 04/20/21 0515 04/22/21 0419  WBC 15.4* 8.1 7.8  RBC 3.95 3.19* 3.46*  HGB 10.9* 8.6* 9.4*  HCT 36.3 28.3* 31.4*  MCV 91.9 88.7 90.8  MCH 27.6 27.0 27.2  MCHC 30.0 30.4 29.9*  RDW 13.7 14.0 14.2  PLT 305 251 254   Thyroid  Recent Labs  Lab 04/29/2021 1207  TSH 0.985    BNP Recent Labs  Lab 05/01/2021 0608  BNP 245.2*    DDimer No results for input(s): DDIMER in the last 168 hours.   Radiology    DG Chest Port 1 View  Result Date: 04/23/2021 CLINICAL DATA:  Acute respiratory distress.  Shock. EXAM: PORTABLE CHEST 1 VIEW COMPARISON:  04/22/2021 FINDINGS: The endotracheal tube tip is 3 cm above the carina. Nasogastric tube enters the stomach. Atherosclerotic calcification of the aortic arch. Left IJ central venous catheter tip: Cavoatrial junction. No appreciable pneumothorax. Thoracic spondylosis. Upper zone pulmonary vascular prominence may be due to supine positioning. No discrete airspace opacity is identified. Mild central airway thickening. Borderline enlargement of the cardiopericardial silhouette. IMPRESSION: 1. Airway thickening is present, suggesting bronchitis or reactive airways disease. 2.  Aortic Atherosclerosis (ICD10-I70.0). 3. A new left IJ central venous catheter is present with tip at the cavoatrial junction. Tubes and lines appear satisfactorily position. Electronically Signed    By: Cindra Eves.D.  On: 04/23/2021 14:31   DG Chest Port 1 View  Result Date: 04/22/2021 CLINICAL DATA:  Respiratory distress, hypoxia EXAM: PORTABLE CHEST 1 VIEW COMPARISON:  04/21/2021 FINDINGS: No significant change in rotated AP portable examination. Support apparatus including endotracheal tube and esophagogastric tube appropriately positioned. Cardiomegaly. Mild, diffuse interstitial opacity, unchanged. IMPRESSION: 1. No significant change in rotated AP portable examination. 2. Support apparatus including endotracheal tube and esophagogastric tube appropriately positioned. 3. Mild, diffuse bilateral interstitial pulmonary opacity, unchanged, and consistent with edema or infection. 4. Cardiomegaly. Electronically Signed   By: Delanna Ahmadi M.D.   On: 04/22/2021 12:36    Cardiac Studies   Echocardiogram Ejection fraction 50 to 49%, grade 2 diastolic dysfunction, severe aortic valve stenosis with mean gradient 40 mmHg, peak gradient 61 mmHg  Patient Profile     70 y.o. female with history of COPD, lung cancer s/p radiation therapy, severe AAS, paroxysmal atrial fibrillation, nonobstructive CAD presenting with shortness of breath diagnosed with respiratory failure requiring intubation  Assessment & Plan    Paroxysmal atrial fibrillation with RVR In the setting of respiratory distress, broke due to atrial fibrillation with RVR this morning, difficult to control given prolonged termination pauses, recurrence of her atrial fibrillation, amiodarone held -Not a good candidate for pacing wire, even permanent pacemaker in the situation. --- For now would elect rate control with diltiazem infusion and use of norepinephrine for blood pressure support -Continue Eliquis 5 twice daily -We will continue amiodarone oral dosing, increase up to 200 twice daily  COPD exacerbation,  Intubated sedated Despite duo nebs, steroids, CPAP, was intubated for worsening respiratory status  lung  cancer Receiving treatment x1 with oncology following Squamous cell carcinoma right lower lung  Critical aortic valve stenosis In her current state, not a good candidate for TAVR Aortic valve stenosis contributing to challenge controlling atrial fibrillation, respiratory distress    Total encounter time more than 45 minutes  Greater than 50% was spent in counseling and coordination of care with the patient  For questions or updates, please contact [facility] Please consult [email or web address] for contact info under        Signed, Ida Rogue, MD  04/23/2021, 4:30 PM

## 2021-04-23 NOTE — Procedures (Signed)
Central Venous Catheter Insertion Procedure Note  Kristin [name]  [id number]  Jan 28, 1951  Date:04/23/21  Time:12:13 PM   Provider Performing:Alila Sotero   Procedure: Insertion of Non-tunneled Central Venous Catheter(36556) with US guidance (03709)   Indication(s) Medication administration  Consent Unable to obtain consent due to emergent nature of procedure.  Anesthesia Topical only with 1% lidocaine   Timeout Verified patient identification, verified procedure, site/side was marked, verified correct patient position, special equipment/implants available, medications/allergies/relevant history reviewed, required imaging and test results available.  Sterile Technique Maximal sterile technique including full sterile barrier drape, hand hygiene, sterile gown, sterile gloves, mask, hair covering, sterile ultrasound probe cover (if used).  Procedure Description Area of catheter insertion was cleaned with chlorhexidine and draped in sterile fashion.  With real-time ultrasound guidance a central venous catheter was placed into the left internal jugular vein. Nonpulsatile blood flow and easy flushing noted in all ports.  The catheter was sutured in place and sterile dressing applied.  Complications/Tolerance None; patient tolerated the procedure well. Chest X-ray is ordered to verify placement for internal jugular or subclavian cannulation.   Chest x-ray is not ordered for femoral cannulation.  EBL Minimal  Specimen(s) None

## 2021-04-23 NOTE — Significant Event (Signed)
Responded to monitor alarm, pt had 2 pauses, both with burst of SVT prior. Precedex stopped, MD to bedside. 12 lead done, labs drawn early. Propofol started.

## 2021-04-23 NOTE — Progress Notes (Signed)
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  Per ED notes and EMS run sheet, patient was found with an oxygen saturation somewhere between the 50s and 70s, obviously tired but responsive. Pt placed on CPAP en route to the ER by EMS and treated with Duonebs and IV solumedrol. However in spite of starting her on CPAP and giving her Solu-Medrol and 1 DuoNeb breathing treatment, she became less responsive during transit.  Patient was intubated in the emergency department, placed on mechanical ventilation, PCCM was consulted for evaluation and help with management   Past Medical History    Acute respiratory failure ([place]) 05/09/2021   Primary cancer of right lower lobe of lung ([place]) 02/20/2021   COPD GOLD ? 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   Weakness     Acute hypoxemic respiratory failure ([place]) 12/23/2020   Acute exacerbation of chronic obstructive pulmonary disease (COPD) ([place]) 12/22/2020   Aspiration into airway     PAF (paroxysmal atrial fibrillation) ([facility])     CAD (coronary artery disease) 09/26/2020   Hyperkalemia 09/26/2020   CHF exacerbation ([place]) 09/26/2020   Acute on chronic respiratory failure  with hypoxia and hypercapnia ([place]) 09/25/2020   Bradycardia 09/25/2020   Lung cancer ([place]) 05/26/2020   Atrial flutter with rapid ventricular response ([place]) 05/26/2020   Demand ischemia ([place])     Acute on chronic heart failure with preserved ejection fraction (HFpEF) ([place])     Aortic stenosis-moderate to severe 03/2020     Obesity, Class III, BMI 40-49.9 (morbid obesity) ([place]) 03/29/2020   Chronic respiratory failure with hypoxia ([place]) 03/29/2020   OSA (obstructive sleep apnea) 03/29/2020   Essential hypertension 03/29/2020   Hypothyroidism 03/29/2020   Chest pain 03/29/2020   Elevated troponin 03/29/2020   Chronic diastolic (congestive) heart failure ([place]) 07/12/2018   Type 2 diabetes mellitus without complication ([place]) 15/40/0867   COPD exacerbation ([place]) 07/12/2018   Tobacco use 07/12/2018   Hypotension 07/12/2018   Chronic respiratory failure with hypoxia and hypercapnia ([place]) 07/04/2018   Morbid obesity due to excess calories ([place]) 02/08/2016    Significant Hospital Events   12/4: Admitted to the ICU with acute on chronic hypoxic hypercapnic respiratory failure. 12/5: Patient was extubated but she failed due to increased work of breathing, hypoxia and CO2 retention  Consults:  PCCM  Procedures:  12/4> Intubation >> 12/5 extubation 12/5 reintubation  Subjective/interval history  Patient continued with fever, T-max of 103 Fever curve is trending down after switching antibiotic to Zosyn X-ray chest showed improvement in bilateral infiltrates Serum creatinine is improving after stopping diuretic therapy  OBJECTIVE  Blood pressure (!) 102/38, pulse 68, temperature 100.2 F (37.9 C), temperature source Axillary, resp. rate (!) 24, height 5' 2.01" (1.575 m), weight 110.1 kg, SpO2 93 %.    Vent Mode:  PRVC FiO2 (%):  [45 %-55 %] 45 % Set Rate:  [24 bmp] 24 bmp Vt Set:  [400 mL] 400 mL PEEP:  [10 cmH20] 10 cmH20 Plateau Pressure:  [23 cmH20] 23 cmH20   Intake/Output  Summary (Last 24 hours) at 04/23/2021 0911 Last data filed at 04/23/2021 0800 Gross per 24 hour  Intake 1703.54 ml  Output 2620 ml  Net -916.46 ml   Filed Weights   04/21/21 0338 04/22/21 0414 04/23/21 0433  Weight: 103.5 kg 105.5 kg 110.1 kg     Physical Examination    Physical exam: General: Crtitically ill-appearing morbidly obese female, orally intubated HEENT: [place]/AT, eyes anicteric.  ETT and OGT in place Neuro: Opens eyes with vocal stimuli, intermittently following few commands, moving all 4 extremities, she does have tremors of right upper extremity Chest: Bilateral inspiratory and expiratory wheezes, No rhonchi or crackles Heart: Regular rate and rhythm, no murmurs or gallops Abdomen: Soft, obese, nondistended, bowel sounds present Skin: No rash  Labs   CBC: Recent Labs  Lab 04/18/2021 0608 04/20/21 0515 04/22/21 0419  WBC 15.4* 8.1 7.8  NEUTROABS 10.9*  --   --   HGB 10.9* 8.6* 9.4*  HCT 36.3 28.3* 31.4*  MCV 91.9 88.7 90.8  PLT 305 251 517    Basic Metabolic Panel: Recent Labs  Lab 04/17/2021 0608 04/20/21 0515 04/21/21 0450 04/22/21 0419 04/23/21 0245  NA 138 143 142 145 145  K 4.0 4.7 4.7 4.8 4.9  CL 94* 103 100 105 104  CO2 35* 33* 32 33* 34*  GLUCOSE 264* 146* 157* 198* 226*  BUN 25* 35* 53* 53* 46*  CREATININE 1.67* 1.78* 2.19* 1.87* 1.54*  CALCIUM 9.0 8.9 8.9 8.8* 9.0  MG  --  2.3 2.4 2.4  --   PHOS  --  4.3 6.0* 3.9  --    GFR: Estimated Creatinine Clearance: 40.9 mL/min (A) (by C-G formula based on SCr of 1.54 mg/dL (H)). Recent Labs  Lab 05/14/2021 0608 04/20/21 0515 04/21/21 0450 04/22/21 0419  PROCALCITON <0.10 0.53 0.43  --   WBC 15.4* 8.1  --  7.8  LATICACIDVEN 1.9  --   --   --     Liver Function Tests: Recent Labs  Lab 05/11/2021 0608 04/22/21 0419  AST 15 15  ALT 11 12  ALKPHOS 44 33*  BILITOT 0.6 0.6  PROT 7.5 6.9  ALBUMIN 3.4* 3.3*   No results for input(s): LIPASE, AMYLASE in the last 168 hours. No results for  input(s): AMMONIA in the last 168 hours.  ABG    Component Value Date/Time   PHART 7.36 04/22/2021 0949   PCO2ART 67 (HH) 04/22/2021 0949   PO2ART 90 04/22/2021 0949   HCO3 37.9 (H) 04/22/2021 0949   O2SAT 96.6 04/22/2021 0949     Coagulation Profile: No results for input(s): INR, PROTIME in the last 168 hours.  Cardiac Enzymes: No results for input(s): CKTOTAL, CKMB, CKMBINDEX, TROPONINI in the last 168 hours.  HbA1C: Hgb A1c MFr Bld  Date/Time Value Ref Range Status  04/17/2021 06:08 AM 6.0 (H) 4.8 - 5.6 % Final    Comment:    (NOTE)         Prediabetes: 5.7 - 6.4         Diabetes: >6.4         Glycemic control for adults with diabetes: <7.0   12/23/2020 05:09 AM 5.4 4.8 - 5.6 % Final    Comment:    (NOTE) Pre diabetes:  5.7%-6.4%  Diabetes:              >6.4%  Glycemic control for   <7.0% adults with diabetes     CBG: Recent Labs  Lab 04/22/21 1530 04/22/21 1912 04/22/21 2351 04/23/21 0415 04/23/21 0733  GLUCAP 214* 228* 192* 214* 151*     Assessment & Plan:  Acute on chronic hypercapnic hypoxic respiratory failure  Acute respiratory acidosis, improved Acute on chronic diastolic CHF exacerbation  Acute COPD exacerbation  OSA  Community-acquired pneumonia with gram-positive cocci  Tobacco dependence Malignant neoplasm of right lung  Continue lung protective ventilation FiO2 was titrated down to 45% and PEEP of 8 Peak and plateau pressures are at goal Hypercapnia has cleared with adjustment of vent setting Patient looks more of euvolemic now Holding Lasix She continues to spike fever, antibiotic switched to Zosyn with improvement in fever curve X-ray chest confirmed bilateral interstitial infiltrates Respiratory culture is growing polymicrobial's Smoking cessation counseling when able to Outpatient follow-up with oncology Continue budesonide inhaler nebs BID, bronchodilators PRNBronchodilators PRN. 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, improving Hyperphosphatemia Likely due to cardiorenal syndrome and then aggressive diuresis Serum creatinine trended down to 1.4 from 2.1 after Lasix was stopped Monitor I&O's / urinary output Avoid nephrotoxic agents as able Replace electrolytes as indicated and closely monitor   Diabetes type 2, complicated with diabetic Peripheral Neuropathy Monitor fingerstick with goal 140-180 Continue sliding scale insulin Hold home Metformin & Januvia Continue gabapentin at lower doses to 300 mg twice daily due to AKI  Squamous cell carcinoma right lower lobe (04/2020) status post SBRT CT scan PET scan September 2022  imaging findings highly concerning for recurrent malignancy Per Oncology last visit holding off biopsy given patient's tenuous respiratory status.   On Keytruda IV infusion every 3 weeks  Outpatient follow-up with oncology  Hypothyroidism Continue Synthroid  Anemia of chronic disease Monitor H&H and transfuse if less than 8  Morbid obesity Dietitian follow-up   Best practice:  Diet:  NPO, tube feeds VAP protocol (if indicated): Yes DVT prophylaxis: Systemic AC GI prophylaxis: PPI Glucose control:  SSI Yes Foley:  Yes, and it is still needed for close monitoring intake and output Mobility:  bed rest  PT consulted: N/A Last date of multidisciplinary goals of care discussion [12/8] Code Status:  full code Disposition: ICU    Primary Emergency Contact: MASHBURN,JUDY A, Home Phone: [phone] Wishes to  pursue full aggressive treatment and intervention options, including CPR and intubation.  After speaking with palliative care team patient's daughter has decided not to proceed with tracheostomy.  If she fails extubation then they would like to proceed with comfort care, will continue aggressive treatment for couple of days before trial of extubation  Critical care time:     Total critical care time: 36 minutes  Performed by: Jacky Kindle   Critical care time was exclusive of separately billable procedures and treating other patients.   Critical care was necessary to treat or prevent imminent or life-threatening deterioration.   Critical care was time spent personally by me on the following activities: development  of treatment plan with patient and/or surrogate as well as nursing, discussions with consultants, evaluation of patient's response to treatment, examination of patient, obtaining history from patient or surrogate, ordering and performing treatments and interventions, ordering and review of laboratory studies, ordering and review of radiographic studies, pulse oximetry and re-evaluation of patient's condition.   Jacky Kindle MD [facility] See Amion for pager If no response to pager, please call [phone] until 7pm After 7pm, Please call E-link [phone]   .

## 2021-04-23 NOTE — Procedures (Signed)
Arterial Catheter Insertion Procedure Note  Kristin [name]  [id number]  1950/12/29  Date:04/23/21  Time:11:36 PM    Provider Performing: Maryjane Hurter    Procedure: Insertion of Arterial Line [phone]) with US guidance ([id number])   [place] image of catheter placement was lost while cleaning machine  Indication(s) Blood pressure monitoring and/or need for frequent ABGs  Consent Verbal, witnessed by bedside RN and obtained from daughter, Kristin [name].  Anesthesia Lidocaine 1%   Time Out Verified patient identification, verified procedure, site/side was marked, verified correct patient position, special equipment/implants available, medications/allergies/relevant history reviewed, required imaging and test results available.   Sterile Technique Maximal sterile technique including full sterile barrier drape, hand hygiene, sterile gown, sterile gloves, mask, hair covering, sterile ultrasound probe cover (if used).   Procedure Description Area of catheter insertion was cleaned with chlorhexidine and draped in sterile fashion. With real-time ultrasound guidance an arterial catheter was placed into the right radial artery.  Appropriate arterial tracings confirmed on monitor.     Complications/Tolerance None; patient tolerated the procedure well.   EBL Minimal   Specimen(s) None

## 2021-04-24 LAB — BASIC METABOLIC PANEL
Anion gap: 4 — ABNORMAL LOW (ref 5–15)
BUN: 50 mg/dL — ABNORMAL HIGH (ref 8–23)
CO2: 36 mmol/L — ABNORMAL HIGH (ref 22–32)
Calcium: 8.8 mg/dL — ABNORMAL LOW (ref 8.9–10.3)
Chloride: 105 mmol/L (ref 98–111)
Creatinine, Ser: 1.58 mg/dL — ABNORMAL HIGH (ref 0.44–1.00)
GFR, Estimated: 35 mL/min — ABNORMAL LOW (ref >60)
Glucose, Bld: 194 mg/dL — ABNORMAL HIGH (ref 70–99)
Potassium: 5.1 mmol/L (ref 3.5–5.1)
Sodium: 145 mmol/L (ref 135–145)

## 2021-04-24 LAB — CULTURE, BLOOD (ROUTINE X 2)
Culture: NO GROWTH
Culture: NO GROWTH
Special Requests: ADEQUATE

## 2021-04-24 LAB — CBC
HCT: 31.4 % — ABNORMAL LOW (ref 36.0–46.0)
Hemoglobin: 9.3 g/dL — ABNORMAL LOW (ref 12.0–15.0)
MCH: 27.6 pg (ref 26.0–34.0)
MCHC: 29.6 g/dL — ABNORMAL LOW (ref 30.0–36.0)
MCV: 93.2 fL (ref 80.0–100.0)
Platelets: 293 10*3/uL (ref 150–400)
RBC: 3.37 MIL/uL — ABNORMAL LOW (ref 3.87–5.11)
RDW: 14.4 % (ref 11.5–15.5)
WBC: 12.9 10*3/uL — ABNORMAL HIGH (ref 4.0–10.5)
nRBC: 0 % (ref 0.0–0.2)

## 2021-04-24 LAB — CULTURE, RESPIRATORY W GRAM STAIN: Culture: NO GROWTH

## 2021-04-24 LAB — TRIGLYCERIDES: Triglycerides: 160 mg/dL — ABNORMAL HIGH (ref <150)

## 2021-04-24 MED ORDER — AMIODARONE HCL IN DEXTROSE 360-4.14 MG/200ML-% IV SOLN: 30.0000 mg/h | INTRAVENOUS | Status: DC

## 2021-04-24 MED ORDER — AMIODARONE HCL IN DEXTROSE 360-4.14 MG/200ML-% IV SOLN: 60.0000 mg/h | INTRAVENOUS | Status: DC

## 2021-04-24 MED ORDER — METHYLPREDNISOLONE SODIUM SUCC 40 MG IJ SOLR
40.0000 mg | Freq: Every day | INTRAMUSCULAR | Status: AC
  Administered 2021-04-24: 40 mg via INTRAVENOUS
  Filled 2021-04-24: qty 1

## 2021-04-24 MED ORDER — METHYLPREDNISOLONE SODIUM SUCC 40 MG IJ SOLR: 40.0000 mg | Freq: Every day | INTRAMUSCULAR | Status: DC

## 2021-04-24 MED ORDER — FUROSEMIDE 10 MG/ML IJ SOLN
80.0000 mg | Freq: Once | INTRAMUSCULAR | Status: AC
  Administered 2021-04-24: 80 mg via INTRAVENOUS
  Filled 2021-04-24: qty 8

## 2021-04-24 MED ORDER — AMIODARONE LOAD VIA INFUSION
150.0000 mg | Freq: Once | INTRAVENOUS | Status: DC
  Filled 2021-04-24: qty 83.34

## 2021-04-24 MED ORDER — AMIODARONE IV BOLUS ONLY 150 MG/100ML
INTRAVENOUS | Status: AC
  Administered 2021-04-24: 150 mg
  Filled 2021-04-24: qty 100

## 2021-04-24 NOTE — Progress Notes (Incomplete)
Progress Note  Patient Name: Kristin [name] Date of Encounter: 04/24/2021  [facility] Cardiologist: Ida Rogue, MD ***  Subjective   ***  Inpatient Medications    Scheduled Meds:  apixaban  5 mg Per Tube BID   arformoterol  15 mcg Nebulization BID   atorvastatin  80 mg Per Tube QHS   budesonide (PULMICORT) nebulizer solution  0.25 mg Nebulization BID   chlorhexidine gluconate (MEDLINE KIT)  15 mL Mouth Rinse BID   Chlorhexidine Gluconate Cloth  6 each Topical Q0600   docusate  100 mg Per Tube BID   ezetimibe  10 mg Per Tube Daily   famotidine  20 mg Per Tube BID   free water  30 mL Per Tube Q4H   gabapentin  300 mg Per Tube BID   insulin aspart  0-15 Units Subcutaneous Q4H   levothyroxine  75 mcg Per Tube Q0600   mouth rinse  15 mL Mouth Rinse 10 times per day   multivitamin  15 mL Per Tube Daily   polyethylene glycol  17 g Per Tube Daily   revefenacin  175 mcg Nebulization Daily   Continuous Infusions:  sodium chloride Stopped (04/23/21 1201)   feeding supplement (VITAL HIGH PROTEIN) 1,000 mL (04/22/21 1425)   fentaNYL infusion INTRAVENOUS 150 mcg/hr (04/24/21 1353)   phenylephrine (NEO-SYNEPHRINE) Adult infusion 25 mcg/min (04/24/21 1353)   piperacillin-tazobactam (ZOSYN)  IV Stopped (04/24/21 1239)   propofol (DIPRIVAN) infusion 20 mcg/kg/min (04/24/21 0906)   PRN Meds: acetaminophen, docusate, fentaNYL, ipratropium-albuterol, polyethylene glycol   Vital Signs    Vitals:   04/24/21 1230 04/24/21 1300 04/24/21 1330 04/24/21 1400  BP: (!) 111/39 (!) 113/36 (!) 123/33   Pulse: (!) 51 (!) 50 (!) 50 (!) 50  Resp: (!) 24 (!) 24 (!) 24 (!) 24  Temp:      TempSrc:      SpO2: 96% 97% 99% 99%  Weight:      Height:        Intake/Output Summary (Last 24 hours) at 04/24/2021 1404 Last data filed at 04/24/2021 1353 Gross per 24 hour  Intake 1310.08 ml  Output 1785 ml  Net -474.92 ml   Last 3 Weights 04/23/2021 04/22/2021 04/21/2021  Weight (lbs) 242 lb 11.6  oz 232 lb 9.4 oz 228 lb 2.8 oz  Weight (kg) 110.1 kg 105.5 kg 103.5 kg      Telemetry    *** - Personally Reviewed  ECG    *** - Personally Reviewed  Physical Exam  *** GEN: No acute distress.   Neck: No JVD Cardiac: RRR, no murmurs, rubs, or gallops.  Respiratory: Clear to auscultation bilaterally. GI: Soft, nontender, non-distended  MS: No edema; No deformity. Neuro:  Nonfocal  Psych: Normal affect   Labs    High Sensitivity Troponin:   Recent Labs  Lab 05/15/2021 0608 05/04/2021 1207 04/23/21 1222 04/23/21 1418  TROPONINIHS 12 17 22* 27*     Chemistry Recent Labs  Lab 04/21/2021 0608 04/20/21 0515 04/22/21 0419 04/23/21 0245 04/23/21 1222 04/23/21 2245 04/24/21 0620  NA 138   < > 145 145  --  145 145  K 4.0   < > 4.8 4.9 4.7 4.9 5.1  CL 94*   < > 105 104  --  104 105  CO2 35*   < > 33* 34*  --  36* 36*  GLUCOSE 264*   < > 198* 226*  --  182* 194*  BUN 25*   < >  53* 46*  --  50* 50*  CREATININE 1.67*   < > 1.87* 1.54*  --  1.57* 1.58*  CALCIUM 9.0   < > 8.8* 9.0  --  8.9 8.8*  MG  --    < > 2.4  --  2.2 2.2  --   PROT 7.5  --  6.9  --   --   --   --   ALBUMIN 3.4*  --  3.3*  --   --   --   --   AST 15  --  15  --   --   --   --   ALT 11  --  12  --   --   --   --   ALKPHOS 44  --  33*  --   --   --   --   BILITOT 0.6  --  0.6  --   --   --   --   GFRNONAA 33*   < > 29* 37*  --  36* 35*  ANIONGAP 9   < > 7 7  --  5 4*   < > = values in this interval not displayed.    Lipids  Recent Labs  Lab 04/24/21 0620  TRIG 160*    Hematology Recent Labs  Lab 04/20/21 0515 04/22/21 0419 04/24/21 0855  WBC 8.1 7.8 12.9*  RBC 3.19* 3.46* 3.37*  HGB 8.6* 9.4* 9.3*  HCT 28.3* 31.4* 31.4*  MCV 88.7 90.8 93.2  MCH 27.0 27.2 27.6  MCHC 30.4 29.9* 29.6*  RDW 14.0 14.2 14.4  PLT 251 254 293   Thyroid  Recent Labs  Lab 05/04/2021 1207  TSH 0.985    BNP Recent Labs  Lab 04/21/2021 0608  BNP 245.2*    DDimer No results for input(s): DDIMER in the last  168 hours.   Radiology    DG Chest Port 1 View  Result Date: 04/23/2021 CLINICAL DATA:  Acute respiratory distress.  Shock. EXAM: PORTABLE CHEST 1 VIEW COMPARISON:  04/22/2021 FINDINGS: The endotracheal tube tip is 3 cm above the carina. Nasogastric tube enters the stomach. Atherosclerotic calcification of the aortic arch. Left IJ central venous catheter tip: Cavoatrial junction. No appreciable pneumothorax. Thoracic spondylosis. Upper zone pulmonary vascular prominence may be due to supine positioning. No discrete airspace opacity is identified. Mild central airway thickening. Borderline enlargement of the cardiopericardial silhouette. IMPRESSION: 1. Airway thickening is present, suggesting bronchitis or reactive airways disease. 2.  Aortic Atherosclerosis (ICD10-I70.0). 3. A new left IJ central venous catheter is present with tip at the cavoatrial junction. Tubes and lines appear satisfactorily position. Electronically Signed   By: Van Clines M.D.   On: 04/23/2021 14:31    Cardiac Studies   Echo 04/2021   1. Left ventricular ejection fraction, by estimation, is 50 to 55%. The  left ventricle has low normal function. The left ventricle has no regional  wall motion abnormalities. Left ventricular diastolic parameters are  consistent with Grade II diastolic  dysfunction (pseudonormalization).   2. Right ventricular systolic function is low normal. The right  ventricular size is not well visualized.   3. Left atrial size was moderately dilated.   4. The mitral valve is degenerative. No evidence of mitral valve  regurgitation. Moderate mitral annular calcification.   5. Aortic valve mean gradient = 14mHg, peak gradient 66mg, Vmax  3.75m16m DVI= 0.23, AVA 0.7cm2. . The aortic valve is calcified. Aortic  valve regurgitation is not visualized. Severe  aortic valve stenosis.   6. The inferior vena cava is normal in size with greater than 50%  respiratory variability, suggesting right  atrial pressure of 3 mmHg.   Conclusion(s)/Recommendation(s): Findings consistent with severe valvular  heart disease.   Heart monitor 02/2021 Event Monitor Patch Wear Time:  9 days and 2 hours (2022-08-24T15:14:12-398 to 2022-09-02T17:28:39-0400)   Patient had a min HR of 41 bpm, max HR of 194 bpm, and avg HR of 81 bpm.  Predominant underlying rhythm was Sinus Rhythm.    2 Ventricular Tachycardia runs occurred, the run with the fastest interval lasting 12 beats with a max rate of 185 bpm (avg 173 bpm);  the run with the fastest interval was also the longest.  Episodes of Ventricular Tachycardia may be possible Atrial Fibrillation with aberrancy.    531 Supraventricular Tachycardia runs occurred, the run with the fastest interval lasting 4 beats with a max rate of 194 bpm, the longest lasting 11 beats with an avg rate of 130 bpm.    Atrial Fibrillation/Flutter occurred (10% burden), ranging from 80-185 bpm (avg of 118 bpm), the longest lasting 58 mins 49 secs with an avg rate of 122 bpm.    19 Pauses occurred, the longest lasting 4.1 secs (15 bpm).    Junctional Rhythm was present.    Isolated SVEs were occasional (1.6%, 16996), SVE Couplets were rare (<1.0%, 3383), and SVE Triplets were rare (<1.0%, 978). Isolated VEs were occasional (2.3%, [id number]), VE Couplets were rare (<1.0%, 2609), and no VE Triplets were present.    Signed, Esmond Plants, MD, Ph.D [facility]  Patient Profile     70 y.o. female with history of COPD, lung cancer s/p radiation therapy, severe AAS, paroxysmal atrial fibrillation, nonobstructive CAD presenting with shortness of breath diagnosed with respiratory failure requiring intubation.  Assessment & Plan    Paroxysmal Afib RVR - In the setting of respiratory distress/COPD exacerbation - Afib RVR converted to SR with post-conversion pauses - titrated off Levo - diltiazem held - Eliquis 19m BID - amiodarone increased to 2051mBID>>Stopped for  COPD  exacerbation/tobacco use Respiratory failure - vent management per CCM - Duonebs, steroids, abx  Lung cancer - treatment per oncology  Critical valve stenosis - not a good TVR candidate  For questions or updates, please contact [facility] Please consult [email or web address] for contact info under        Signed, Oluwanifemi Petitti H Ninfa MeekerPA-C  04/24/2021, 2:04 PM

## 2021-04-24 NOTE — Plan of Care (Signed)
Continuing with plan of care. 

## 2021-04-24 NOTE — Progress Notes (Signed)
Progress Note  Patient Name: Kristin [name] Date of Encounter: 04/24/2021  [facility] Cardiologist: Ida Rogue, MD   Subjective   Intubated, sedated Continues to have labile blood pressures requiring weaning up and down off pressors Labile heart rate sinus bradycardia ranging to rapid atrial fibrillation Difficult for nurses to manage her infusions given labile nature of her cardiac disease Amiodarone held for long termination pauses  Inpatient Medications    Scheduled Meds:  apixaban  5 mg Per Tube BID   arformoterol  15 mcg Nebulization BID   atorvastatin  80 mg Per Tube QHS   budesonide (PULMICORT) nebulizer solution  0.25 mg Nebulization BID   chlorhexidine gluconate (MEDLINE KIT)  15 mL Mouth Rinse BID   Chlorhexidine Gluconate Cloth  6 each Topical Q0600   docusate  100 mg Per Tube BID   ezetimibe  10 mg Per Tube Daily   famotidine  20 mg Per Tube BID   free water  30 mL Per Tube Q4H   gabapentin  300 mg Per Tube BID   insulin aspart  0-15 Units Subcutaneous Q4H   levothyroxine  75 mcg Per Tube Q0600   mouth rinse  15 mL Mouth Rinse 10 times per day   multivitamin  15 mL Per Tube Daily   polyethylene glycol  17 g Per Tube Daily   revefenacin  175 mcg Nebulization Daily   Continuous Infusions:  sodium chloride Stopped (04/23/21 1201)   feeding supplement (VITAL HIGH PROTEIN) 1,000 mL (04/22/21 1425)   fentaNYL infusion INTRAVENOUS 150 mcg/hr (04/24/21 1651)   phenylephrine (NEO-SYNEPHRINE) Adult infusion 15 mcg/min (04/24/21 1651)   piperacillin-tazobactam (ZOSYN)  IV Stopped (04/24/21 1239)   propofol (DIPRIVAN) infusion 20 mcg/kg/min (04/24/21 1715)   PRN Meds: acetaminophen, docusate, fentaNYL, ipratropium-albuterol, polyethylene glycol   Vital Signs    Vitals:   04/24/21 1530 04/24/21 1557 04/24/21 1600 04/24/21 1630  BP: (!) 103/42  (!) 94/29 (!) 93/31  Pulse: 78  (!) 52 (!) 51  Resp: (!) 24  19 (!) 24  Temp:   99.2 F (37.3 C)   TempSrc:    Axillary   SpO2: 99% 92% 96% 97%  Weight:      Height:        Intake/Output Summary (Last 24 hours) at 04/24/2021 1729 Last data filed at 04/24/2021 1651 Gross per 24 hour  Intake 1515.44 ml  Output 1675 ml  Net -159.56 ml   Last 3 Weights 04/23/2021 04/22/2021 04/21/2021  Weight (lbs) 242 lb 11.6 oz 232 lb 9.4 oz 228 lb 2.8 oz  Weight (kg) 110.1 kg 105.5 kg 103.5 kg      Telemetry    Rhythm ranging from sinus bradycardia to rapid atrial fibrillation rate 150 bpm- Personally Reviewed  ECG     - Personally Reviewed  Physical Exam   GEN: Intubated sedated eyes will open Neck: Unable to estimate JVD Cardiac: Regular, bradycardic no murmurs, rubs, or gallops.  Respiratory: Coarse breath sounds bilaterally GI: Soft, nondistended MS: No edema; No deformity. Neuro: Grossly nonfocal , full exam not performed Psych: Unable to assess  Labs    High Sensitivity Troponin:   Recent Labs  Lab 05/12/2021 0608 04/21/2021 1207 04/23/21 1222 04/23/21 1418  TROPONINIHS 12 17 22* 27*     Chemistry Recent Labs  Lab 05/04/2021 0608 04/20/21 0515 04/22/21 0419 04/23/21 0245 04/23/21 1222 04/23/21 2245 04/24/21 0620  NA 138   < > 145 145  --  145 145  K 4.0   < >  4.8 4.9 4.7 4.9 5.1  CL 94*   < > 105 104  --  104 105  CO2 35*   < > 33* 34*  --  36* 36*  GLUCOSE 264*   < > 198* 226*  --  182* 194*  BUN 25*   < > 53* 46*  --  50* 50*  CREATININE 1.67*   < > 1.87* 1.54*  --  1.57* 1.58*  CALCIUM 9.0   < > 8.8* 9.0  --  8.9 8.8*  MG  --    < > 2.4  --  2.2 2.2  --   PROT 7.5  --  6.9  --   --   --   --   ALBUMIN 3.4*  --  3.3*  --   --   --   --   AST 15  --  15  --   --   --   --   ALT 11  --  12  --   --   --   --   ALKPHOS 44  --  33*  --   --   --   --   BILITOT 0.6  --  0.6  --   --   --   --   GFRNONAA 33*   < > 29* 37*  --  36* 35*  ANIONGAP 9   < > 7 7  --  5 4*   < > = values in this interval not displayed.    Lipids  Recent Labs  Lab 04/24/21 0620  TRIG 160*     Hematology Recent Labs  Lab 04/20/21 0515 04/22/21 0419 04/24/21 0855  WBC 8.1 7.8 12.9*  RBC 3.19* 3.46* 3.37*  HGB 8.6* 9.4* 9.3*  HCT 28.3* 31.4* 31.4*  MCV 88.7 90.8 93.2  MCH 27.0 27.2 27.6  MCHC 30.4 29.9* 29.6*  RDW 14.0 14.2 14.4  PLT 251 254 293   Thyroid  Recent Labs  Lab 05/04/2021 1207  TSH 0.985    BNP Recent Labs  Lab 05/09/2021 0608  BNP 245.2*    DDimer No results for input(s): DDIMER in the last 168 hours.   Radiology    DG Chest Port 1 View  Result Date: 04/23/2021 CLINICAL DATA:  Acute respiratory distress.  Shock. EXAM: PORTABLE CHEST 1 VIEW COMPARISON:  04/22/2021 FINDINGS: The endotracheal tube tip is 3 cm above the carina. Nasogastric tube enters the stomach. Atherosclerotic calcification of the aortic arch. Left IJ central venous catheter tip: Cavoatrial junction. No appreciable pneumothorax. Thoracic spondylosis. Upper zone pulmonary vascular prominence may be due to supine positioning. No discrete airspace opacity is identified. Mild central airway thickening. Borderline enlargement of the cardiopericardial silhouette. IMPRESSION: 1. Airway thickening is present, suggesting bronchitis or reactive airways disease. 2.  Aortic Atherosclerosis (ICD10-I70.0). 3. A new left IJ central venous catheter is present with tip at the cavoatrial junction. Tubes and lines appear satisfactorily position. Electronically Signed   By: Van Clines M.D.   On: 04/23/2021 14:31    Cardiac Studies   Echocardiogram Ejection fraction 50 to 62%, grade 2 diastolic dysfunction, severe aortic valve stenosis with mean gradient 40 mmHg, peak gradient 61 mmHg  Patient Profile     70 y.o. female with history of COPD, lung cancer s/p radiation therapy, severe AAS, paroxysmal atrial fibrillation, nonobstructive CAD presenting with shortness of breath diagnosed with respiratory failure requiring intubation  Assessment & Plan    Paroxysmal atrial fibrillation with RVR Very  challenging management, not a good candidate for amiodarone given long termination pauses and sinus bradycardia rate low 50, high 40s ----Would recommend we treat rapid atrial fibrillation with small doses of diltiazem bolus 5-10 IV push, for persistent atrial fibrillation would use diltiazem infusion -Once she has converted back to sinus bradycardia, will need to hold the diltiazem infusion ----In an ideal situation would benefit from pacemaker from tachybradycardia syndrome -She is not a candidate at this time for pacemaker or ablation It appears amiodarone pill held for bradycardia Some of her bradycardia will be exacerbated by sedation  COPD exacerbation,  Intubated sedated Despite duo nebs, steroids, CPAP, was intubated for worsening respiratory status Attempts at intubation unsuccessful, was reintubated -This will be challenging in the setting of underlying lung cancer, morbid obesity, deconditioning, tachyarrhythmia  lung cancer Receiving treatment x1 with oncology following Squamous cell carcinoma right lower lung  Critical aortic valve stenosis In her current state, not a good candidate for TAVR Aortic valve stenosis contributing to challenge controlling atrial fibrillation, respiratory distress  Agree with palliative care discussions  Total encounter time more than 35 minutes  Greater than 50% was spent in counseling and coordination of care with the patient  For questions or updates, please contact [facility] Please consult [email or web address] for contact info under        Signed, Ida Rogue, MD  04/24/2021, 5:29 PM

## 2021-04-24 NOTE — Progress Notes (Signed)
NAME:  Kristin [name], MRN:  [id number], DOB:  1951-02-25, LOS: 10 ADMISSION DATE:  05/14/2021, CONSULTATION DATE:  04/27/2021 REFERRING MD: Hinda Kehr, MD CHIEF COMPLAINT: SOB    HPI  70 y.o. 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  Per ED notes and EMS run sheet, patient was found with an oxygen saturation somewhere between the 50s and 70s, obviously tired but responsive. Pt placed on CPAP en route to the ER by EMS and treated with Duonebs and IV solumedrol. However in spite of starting her on CPAP and giving her Solu-Medrol and 1 DuoNeb breathing treatment, she became less responsive during transit.  Patient was intubated in the emergency department, placed on mechanical ventilation, PCCM was consulted for evaluation and help with management   Past Medical History    Acute respiratory failure ([place]) 05/01/2021   Primary cancer of right lower lobe of lung ([place]) 02/20/2021   COPD GOLD ? 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   Weakness     Acute hypoxemic respiratory failure ([place]) 12/23/2020   Acute exacerbation of chronic obstructive pulmonary disease (COPD) ([place]) 12/22/2020   Aspiration into airway     PAF (paroxysmal atrial fibrillation) (HCC)     CAD (coronary artery disease) 09/26/2020   Hyperkalemia 09/26/2020   CHF exacerbation ([place]) 09/26/2020   Acute on chronic respiratory failure with  hypoxia and hypercapnia ([place]) 09/25/2020   Bradycardia 09/25/2020   Lung cancer ([place]) 05/26/2020   Atrial flutter with rapid ventricular response ([place]) 05/26/2020   Demand ischemia ([place])     Acute on chronic heart failure with preserved ejection fraction (HFpEF) ([place])     Aortic stenosis-moderate to severe 03/2020     Obesity, Class III, BMI 40-49.9 (morbid obesity) ([place]) 03/29/2020   Chronic respiratory failure with hypoxia ([place]) 03/29/2020   OSA (obstructive sleep apnea) 03/29/2020   Essential hypertension 03/29/2020   Hypothyroidism 03/29/2020   Chest pain 03/29/2020   Elevated troponin 03/29/2020   Chronic diastolic (congestive) heart failure ([place]) 07/12/2018   Type 2 diabetes mellitus without complication ([place]) 67/04/4579   COPD exacerbation ([place]) 07/12/2018   Tobacco use 07/12/2018   Hypotension 07/12/2018   Chronic respiratory failure with hypoxia and hypercapnia ([place]) 07/04/2018   Morbid obesity due to excess calories ([place]) 02/08/2016    Significant Hospital Events   12/4: Admitted to the ICU with acute on chronic hypoxic hypercapnic respiratory failure. 12/5: Patient was extubated but she failed due to increased work of breathing, hypoxia and CO2 retention 12/8: Multiple bouts of Aflutter requiring initiation of amiodarone; transitioned from Levophed to Neosynephrine; CVC and arterial line placed  Consults:  PCCM  Procedures:  12/4> Intubation >> 12/5 extubation 12/5 reintubation  Subjective/interval history  Fever curve downtrending. She is now in sinus rhythm after amiodarone bolus overnight. Cardizem infusion was stopped last night as well. On low dose phenylephrine via LIJ CVC (placed yesterday). Fluid balance 1L negative last 24 hours. She remains sedated and mechanically ventilated.  OBJECTIVE  Blood pressure [place] Kitchen)  113/35, pulse (!) 55, temperature 99.5 F (37.5 C), temperature source Oral, resp. rate (!) 24, height 5' 2.01" (1.575 m), weight 110.1 kg, SpO2 94 %.     Vent Mode: PRVC FiO2 (%):  [35 %-45 %] 35 % Set Rate:  [24 bmp] 24 bmp Vt Set:  [400 IO-962952 mL] 400400 mL PEEP:  [10 cmH20] 10 cmH20 Plateau Pressure:  [22 cmH20] 22 cmH20   Intake/Output Summary (Last 24 hours) at 04/24/2021 0803 Last data filed at 04/24/2021 0400 Gross per 24 hour  Intake 340.33 ml  Output 1160 ml  Net -819.67 ml   Filed Weights   04/21/21 0338 04/22/21 0414 04/23/21 0433  Weight: 103.5 kg 105.5 kg 110.1 kg     Physical Examination    Physical exam: General: Crtitically ill-appearing morbidly obese female, orally intubated HEENT: [place]/AT, eyes anicteric.  ETT and OGT in place Neuro: Opens eyes with vocal stimuli, intermittently following few commands, non-focal exam Chest: CTA b/l, no W/C/R Heart: sinus rhythm this AM, no MRG Abdomen: Soft, obese, nondistended, bowel sounds present Skin: No rash  Labs   CBC: Recent Labs  Lab 05/14/2021 0608 04/20/21 0515 04/22/21 0419  WBC 15.4* 8.1 7.8  NEUTROABS 10.9*  --   --   HGB 10.9* 8.6* 9.4*  HCT 36.3 28.3* 31.4*  MCV 91.9 88.7 90.8  PLT 305 251 841    Basic Metabolic Panel: Recent Labs  Lab 04/20/21 0515 04/21/21 0450 04/22/21 0419 04/23/21 0245 04/23/21 1222 04/23/21 2245 04/24/21 0620  NA 143 142 145 145  --  145 145  K 4.7 4.7 4.8 4.9 4.7 4.9 5.1  CL 103 100 105 104  --  104 105  CO2 33* 32 33* 34*  --  36* 36*  GLUCOSE 146* 157* 198* 226*  --  182* 194*  BUN 35* 53* 53* 46*  --  50* 50*  CREATININE 1.78* 2.19* 1.87* 1.54*  --  1.57* 1.58*  CALCIUM 8.9 8.9 8.8* 9.0  --  8.9 8.8*  MG 2.3 2.4 2.4  --  2.2 2.2  --   PHOS 4.3 6.0* 3.9  --   --   --   --    GFR: Estimated Creatinine Clearance: 39.9 mL/min (A) (by C-G formula based on SCr of 1.58 mg/dL (H)). Recent Labs  Lab 05/13/2021 0608 04/20/21 0515 04/21/21 0450 04/22/21 0419  PROCALCITON <0.10 0.53 0.43  --   WBC 15.4* 8.1  --  7.8  LATICACIDVEN 1.9  --   --   --     Liver Function Tests: Recent Labs  Lab 04/24/2021 0608  04/22/21 0419  AST 15 15  ALT 11 12  ALKPHOS 44 33*  BILITOT 0.6 0.6  PROT 7.5 6.9  ALBUMIN 3.4* 3.3*   No results for input(s): LIPASE, AMYLASE in the last 168 hours. No results for input(s): AMMONIA in the last 168 hours.  ABG    Component Value Date/Time   PHART 7.36 04/22/2021 0949   PCO2ART 67 (HH) 04/22/2021 0949   PO2ART 90 04/22/2021 0949   HCO3 37.9 (H) 04/22/2021 0949   O2SAT 96.6 04/22/2021 0949     Coagulation Profile: No results for input(s): INR, PROTIME in the last 168 hours.  Cardiac Enzymes: No results for input(s): CKTOTAL, CKMB, CKMBINDEX, TROPONINI in the last 168 hours.  HbA1C: Hgb A1c MFr Bld  Date/Time Value Ref Range Status  05/13/2021 06:08 AM 6.0 (H) 4.8 - 5.6 % Final    Comment:    (NOTE)  Prediabetes: 5.7 - 6.4         Diabetes: >6.4         Glycemic control for adults with diabetes: <7.0   12/23/2020 05:09 AM 5.4 4.8 - 5.6 % Final    Comment:    (NOTE) Pre diabetes:          5.7%-6.4%  Diabetes:              >6.4%  Glycemic control for   <7.0% adults with diabetes     CBG: Recent Labs  Lab 04/23/21 0733 04/23/21 1114 04/23/21 1535 04/23/21 2000 04/23/21 2350  GLUCAP 151* 80 139* 173* 147*     Assessment & Plan:  Acute on chronic hypercapnic hypoxic respiratory failure  Acute respiratory acidosis, improved Acute on chronic diastolic CHF exacerbation  Acute COPD exacerbation  OSA  Community-acquired pneumonia with gram-positive cocci  Tobacco dependence Malignant neoplasm of right lung  Continue lung protective ventilation Wean FiO2, PEEP as able; goal Pplat <=30 Goal euvolemic Continue Zosyn; trend culture data Smoking cessation counseling when able to Outpatient follow-up with oncology Continue budesonide inhaler nebs BID, bronchodilators PRN Transition Solumedrol to daily for 1 additional day  Paroxysmal atrial fibrillation Patient remained in sinus rhythm Continue amiodarone Continue Eliquis for  stroke prophylaxis   Severe Aortic stenosis (followed by structural clinic not candidate for invasive procedures) Cardiology is following He may be candidate for TAVR as an outpatient, depends on her improvement in respiratory status Continue atorvastatin Hold Irbesartan the setting of AKI   AKI on CKD-Stage IIIa, improving Hyperphosphatemia Likely due to cardiorenal syndrome and then aggressive diuresis Monitor I&O's / urinary output Avoid nephrotoxic agents as able Replace electrolytes as indicated and closely monitor   Diabetes type 2, complicated with diabetic Peripheral Neuropathy Monitor fingerstick with goal 140-180 Continue sliding scale insulin Hold home Metformin & Januvia Continue gabapentin at lower doses to 300 mg twice daily due to AKI  Squamous cell carcinoma right lower lobe (04/2020) status post SBRT with concern for recurrence CT scan PET scan September 2022  imaging findings highly concerning for recurrent malignancy Per Oncology last visit holding off biopsy given patient's tenuous respiratory status.   On Keytruda IV infusion every 3 weeks  Outpatient follow-up with oncology  Hypothyroidism Continue Synthroid  Anemia of chronic disease Monitor H&H and transfuse if less than 8  Morbid obesity Dietitian follow-up   Best practice:  Diet:  NPO, tube feeds VAP protocol (if indicated): Yes DVT prophylaxis: Systemic AC GI prophylaxis: PPI Glucose control:  SSI Yes Foley:  Yes, and it is still needed for close monitoring intake and output Mobility:  bed rest  PT consulted: N/A Last date of multidisciplinary goals of care discussion [12/8] Code Status:  full code Disposition: ICU    Primary Emergency Contact: MASHBURN,JUDY A, Home Phone: [phone] Wishes to pursue full aggressive treatment and intervention options, including CPR and intubation.  After speaking with palliative care team patient's daughter has decided not to proceed with tracheostomy.   If she fails extubation then they would like to proceed with comfort care, will continue aggressive treatment for couple of days before trial of extubation  Critical care time: 36 minutes    Bennie Pierini, MD 04/24/21 8:03 AM   [facility] See Amion for pager After 7pm, Please call E-link [phone]

## 2021-04-24 NOTE — Progress Notes (Signed)
Nutrition Follow Up Note   DOCUMENTATION CODES:   Morbid obesity  INTERVENTION:   Continue Vital HP @ 18m/hr continuous   Propofol: 12.66 ml/hr- provides 334kcal/day   Free water flushes 377mq4 hours to maintain tube patency   Regimen provides 1654kcal/day, 116g/day protein and 128424may free water   Liquid MVI daily via tube   NUTRITION DIAGNOSIS:   Inadequate oral intake related to inability to eat (pt sedated and ventilated) as evidenced by NPO status.  GOAL:   Provide needs based on ASPEN/SCCM guidelines -met with tube feeds  MONITOR:   Vent status, Labs, Weight trends, TF tolerance, Skin, I & O's  ASSESSMENT:   [age]o. 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 fib on Eliquis who presented to [facility] from home due to acute respiratory distress.  Pt tolerating tube feeds well at goal rate. Refeed labs stable. Per chart, pt up ~9lbs from her UBW. Pt -805m55m her I & Os. Palliative care following; family does not want trach or PEG. No BM noted since 12/5.   Medications reviewed and include: colace, pepcid, insulin, MVI, synthroid, miralax, neo-synephrine, zosyn, propofol  Labs reviewed: K 5.1 wnl, BUN 50(H), creat 1.58(H) P 3.9 wnl-12/7, Mg 2.2 wnl- 12/8 Hgb 9.3(L), Hct 31.4(L) Cbgs- 147, 173, 139, 80, 151, 214 x 48 hrs   Patient is currently intubated on ventilator support MV: 9.7 L/min Temp (24hrs), Avg:99.7 F (37.6 C), Min:99 F (37.2 C), Max:100.4 F (38 C)  Propofol: 12.66 ml/hr- provides 334kcal/day   MAP- >65mm47m UOP- 1430ml 18met Order:   Diet Order             Diet  NPO time specified  Diet effective now                  EDUCATION NEEDS:   No education needs have been identified at this time  Skin:  Skin Assessment: Reviewed RN Assessment  Last BM:  12/5- type 7  Height:   Ht Readings from Last 1 Encounters:  04/24/21 5' 2.01" (1.575 m)    Weight:   Wt Readings from Last 1 Encounters:  04/23/21 110.1 kg    Ideal Body Weight:  50 kg  BMI:  Body mass index is 44.38 kg/m.  Estimated Nutritional Needs:   Kcal:  1139-1449kcal/day  Protein:  >100g/day  Fluid:  1.3-1.5L/day  Jake Fuhrmann Koleen DistanceD, LDN Please refer to [facility] and/or RD on-call/weekend/after hours pager

## 2021-04-24 NOTE — Progress Notes (Signed)
Patient has had an eventful day, patient has sustained heart rate in the high 40s and low 50s, continues on vent and requiring suctioning intermittently throughout the day.  Patient continues to respond to voice with opening eyes and presents with gag reflex during suctioning.  Will endorse events of day to oncoming nurse and will continue to monitor patient.

## 2021-04-24 NOTE — Progress Notes (Signed)
Per the request of the POA, the only visitors that are able to visit this patient are the following:  - John Aiana Nordquist - Natale Milch [place] - Waynetta Sandy ([place]) - [place]

## 2021-04-24 NOTE — TOC Progression Note (Signed)
Transition of Care [facility]) - Progression Note    Patient Details  Name: Kristin [name] MRN: [id number] Date of Birth: 02-20-1951  Transition of [facility] CM/SW Contact  Shelbie Hutching, RN Phone Number: 04/24/2021, 1:05 PM  Clinical Narrative:    TOC continues to follow, patient remains intubated and sedated.  Family has been at the bedside today.  Tomorrow is patient's birthday.     Expected Discharge Plan:  (possible comfort care or hospice) Barriers to Discharge: Continued Medical Work up  Expected Discharge Plan and Services Expected Discharge Plan:  (possible comfort care or hospice)   Discharge Planning Services: CM Consult   Living arrangements for the past 2 months: Single Family Home                 DME Arranged: N/A DME Agency: NA       HH Arranged: NA HH Agency: NA         Social Determinants of Health (SDOH) Interventions    Readmission Risk Interventions Readmission Risk Prevention Plan 04/22/2021 01/19/2021 01/02/2021  Transportation Screening Complete Complete Complete  Medication Review Press photographer) Complete Complete Complete  PCP or Specialist appointment within 3-5 days of discharge Complete Complete Complete  HRI or Home Care Consult Complete Complete Complete  SW Recovery Care/Counseling Consult Complete Complete Complete  Palliative Care Screening Complete Not Applicable Not [place] Not Applicable Not Applicable Not Applicable  Some recent data might be hidden

## 2021-04-24 NOTE — Consult Note (Addendum)
Pharmacy Antibiotic Note  Kristin [name] is a 70 y.o. female admitted on 05/03/2021 with  acute on chronic respiratory failure requiring intubation and mechanical ventilation . Patient was initially intubated 12/4 and extubated 12/5. However, she failed BiPAP and was re-intubated 12/6. Patient has been on ceftriaxone + azithromycin for three days, however, new fever to 101.55F on AM of 12/7. Pharmacy has been consulted for Zosyn dosing.  Yesterday, 04/23/21 --Failed SBT / WUA. Became hemodynamically unstable with arrhythmias  Today, 04/24/21 --Remains intubated, sedated, on MV. Oxygen requirements stable on 35% FiO2 --Day # 6 antibiotics (day # 3 Zosyn, completed 3 days ceftriaxone + azithromycin) --Still having low grade fevers over last 24h. Mild leukocytosis on differential today --Repeat tracheal aspirate finalized with no growth --Remains hemodynamically unstable on low dose phenylephrine  Plan: Zosyn 3.375g IV q8h (4 hour infusion).  Un-clear why patient continues to have low grade fevers despite broadening of antimicrobial therapy. Cultures are un-revealing. Consider non-infectious etiology, drug resistant organism, non-respiratory source / lack of source control, fungal organism  Height: 5' 2.01" (157.5 cm) Weight: 110.1 kg (242 lb 11.6 oz) IBW/kg (Calculated) : 50.12  Temp (24hrs), Avg:99.7 F (37.6 C), Min:99 F (37.2 C), Max:100.4 F (38 C)  Recent Labs  Lab 04/26/2021 0608 04/20/21 0515 04/21/21 0450 04/22/21 0419 04/23/21 0245 04/23/21 2245 04/24/21 0620 04/24/21 0855  WBC 15.4* 8.1  --  7.8  --   --   --  12.9*  CREATININE 1.67* 1.78* 2.19* 1.87* 1.54* 1.57* 1.58*  --   LATICACIDVEN 1.9  --   --   --   --   --   --   --      Estimated Creatinine Clearance: 39.9 mL/min (A) (by C-G formula based on SCr of 1.58 mg/dL (H)).    Allergies  Allergen Reactions   Altace [Ramipril] Swelling    Antimicrobials this admission: Azithromycin 12/4 >> 12/6 Ceftriaxone 12/4  >> 12/6 Zosyn 12/7 >>   Dose adjustments this admission: N/A  Microbiology results: 12/4 BCx: NG 12/4 MRSA PCR: (-) 12/4 Sputum: NF 12/6 Sputum: NG 12/7 MRSA PCR: (-)  Thank you for allowing pharmacy to be a part of this patient's care.  Benita Gutter 04/24/2021 11:53 AM

## 2021-04-24 NOTE — Consult Note (Signed)
PHARMACY CONSULT NOTE  Pharmacy Consult for Electrolyte Monitoring and Replacement   Recent Labs: Potassium (mmol/L)  Date Value  04/24/2021 5.1   Magnesium (mg/dL)  Date Value  04/23/2021 2.2   Calcium (mg/dL)  Date Value  04/24/2021 8.8 (L)   Albumin (g/dL)  Date Value  04/22/2021 3.3 (L)   Phosphorus (mg/dL)  Date Value  04/22/2021 3.9   Sodium (mmol/L)  Date Value  04/24/2021 145   Assessment: Patient is a 70 y/o F with medical history including HFpEF, AS, Afib, HTN, chronic respiratory failure, tobacco abuse, COPD, OSA, hypothyroidism, diabetes, anxiety, GERD, lung cancer who is admitted with acute on chronic respiratory failure requiring intubation and mechanical ventilation. Pharmacy consulted to assist with electrolyte replacement as indicated.   Patient extubated 12/5. Failed BiPAP. Ultimately re-intubated 12/6  Goal of Therapy:  Electrolytes within normal limits  Plan:  --No electrolyte replacement indicated at this time. Potassium at ULN. Scr stable --Follow-up electrolytes with AM labs tomorrow  Benita Gutter 04/24/2021 7:57 AM

## 2021-04-24 NOTE — Progress Notes (Signed)
Palliative: Kristin [name] is lying quietly in bed.  She is intubated/ventilated and sedated.  She is unable to make her basic needs known.  Her daughter/HC POA, Kristin [name], is at bedside.  We talked about weaning yesterday and heart arrhythmia.  We talked about Kristin [name]'s acute health concerns.  I encouraged Kristin [name] to consider CODE STATUS, continuing to treat the treatable, but if she worsens and her heart stops chest compressions and/or defibrillation may only cause suffering.  Kristin [name] shares that her mother had always wanted attempted resuscitation, but acknowledges she would not want Kristin [name]'s ribs broken (she states this on her own).  At that point, shared that some of the realities of CPR and defibrillation.  I share my worries that if she were to be so sick to need chest compressions she may suffer.   I encouraged Kristin [name] to consider choices.  I reassure Kristin [name] that no one is pressing her to make a choice right now.  Conference with CCM NP related to patient condition, needs, goals of care. PMT to continue to follow.  Plan: Continue full scope/full code.  Time for outcomes.  Patient had stated previously that she would not want tracheostomy.  52 minutes Kristin Axe, NP Palliative medicine team Team phone [phone] Greater than 50% of this time was spent counseling and coordinating care related to the above assessment and plan.

## 2021-04-25 ENCOUNTER — Inpatient Hospital Stay: Payer: Medicare Other

## 2021-04-25 LAB — CBC
HCT: 30.8 % — ABNORMAL LOW (ref 36.0–46.0)
Hemoglobin: 9.2 g/dL — ABNORMAL LOW (ref 12.0–15.0)
MCH: 27.8 pg (ref 26.0–34.0)
MCHC: 29.9 g/dL — ABNORMAL LOW (ref 30.0–36.0)
MCV: 93.1 fL (ref 80.0–100.0)
Platelets: 248 10*3/uL (ref 150–400)
RBC: 3.31 MIL/uL — ABNORMAL LOW (ref 3.87–5.11)
RDW: 14.3 % (ref 11.5–15.5)
WBC: 13.3 10*3/uL — ABNORMAL HIGH (ref 4.0–10.5)
nRBC: 0 % (ref 0.0–0.2)

## 2021-04-25 LAB — LACTIC ACID, PLASMA: Lactic Acid, Venous: 0.7 mmol/L (ref 0.5–1.9)

## 2021-04-25 LAB — BASIC METABOLIC PANEL
Anion gap: 6 (ref 5–15)
BUN: 64 mg/dL — ABNORMAL HIGH (ref 8–23)
CO2: 39 mmol/L — ABNORMAL HIGH (ref 22–32)
Calcium: 9.3 mg/dL (ref 8.9–10.3)
Chloride: 104 mmol/L (ref 98–111)
Creatinine, Ser: 1.63 mg/dL — ABNORMAL HIGH (ref 0.44–1.00)
GFR, Estimated: 34 mL/min — ABNORMAL LOW (ref >60)
Glucose, Bld: 129 mg/dL — ABNORMAL HIGH (ref 70–99)
Potassium: 4.7 mmol/L (ref 3.5–5.1)
Sodium: 149 mmol/L — ABNORMAL HIGH (ref 135–145)

## 2021-04-25 LAB — MAGNESIUM: Magnesium: 2.3 mg/dL (ref 1.7–2.4)

## 2021-04-25 LAB — PHOSPHORUS: Phosphorus: 5.4 mg/dL — ABNORMAL HIGH (ref 2.5–4.6)

## 2021-04-25 MED ORDER — FREE WATER
150.0000 mL | Status: DC
  Administered 2021-04-25 – 2021-04-29 (×23): 150 mL

## 2021-04-25 MED ORDER — FUROSEMIDE 10 MG/ML IJ SOLN
80.0000 mg | Freq: Once | INTRAMUSCULAR | Status: AC
  Administered 2021-04-25: 80 mg via INTRAVENOUS
  Filled 2021-04-25: qty 8

## 2021-04-25 MED ORDER — DILTIAZEM HCL 30 MG PO TABS
30.0000 mg | ORAL_TABLET | Freq: Four times a day (QID) | ORAL | Status: DC
  Administered 2021-04-25 – 2021-04-29 (×14): 30 mg via ORAL
  Filled 2021-04-25 (×14): qty 1

## 2021-04-25 MED ORDER — MIDAZOLAM HCL 2 MG/2ML IJ SOLN
INTRAMUSCULAR | Status: AC
  Filled 2021-04-25: qty 2

## 2021-04-25 MED ORDER — MIDAZOLAM HCL 2 MG/2ML IJ SOLN
2.0000 mg | Freq: Once | INTRAMUSCULAR | Status: AC
  Administered 2021-04-25: 2 mg via INTRAVENOUS

## 2021-04-25 MED ORDER — MIDODRINE HCL 5 MG PO TABS
5.0000 mg | ORAL_TABLET | Freq: Three times a day (TID) | ORAL | Status: DC
  Administered 2021-04-25 – 2021-04-26 (×3): 5 mg
  Filled 2021-04-25 (×3): qty 1

## 2021-04-25 MED ORDER — DEXMEDETOMIDINE HCL IN NACL 400 MCG/100ML IV SOLN
0.0000 ug/kg/h | INTRAVENOUS | Status: DC
  Filled 2021-04-25: qty 100

## 2021-04-25 MED ORDER — DILTIAZEM HCL-DEXTROSE 125-5 MG/125ML-% IV SOLN (PREMIX): 5.0000 mg/h | INTRAVENOUS | Status: DC

## 2021-04-25 MED ORDER — PHENYLEPHRINE CONCENTRATED 100MG/250ML (0.4 MG/ML) INFUSION SIMPLE
0.0000 ug/min | INTRAVENOUS | Status: DC
  Administered 2021-04-25: 25 ug/min via INTRAVENOUS
  Administered 2021-04-27: 35 ug/min via INTRAVENOUS
  Filled 2021-04-25 (×3): qty 250

## 2021-04-25 MED ORDER — GABAPENTIN 600 MG PO TABS: 300.0000 mg | ORAL_TABLET | Freq: Every day | ORAL | Status: DC

## 2021-04-25 MED ORDER — DIGOXIN 0.25 MG/ML IJ SOLN
0.2500 mg | Freq: Once | INTRAMUSCULAR | Status: AC
Start: 2021-04-25 — End: 2021-04-25
  Administered 2021-04-25: 0.25 mg via INTRAVENOUS
  Filled 2021-04-25 (×2): qty 2

## 2021-04-25 MED ORDER — GABAPENTIN 600 MG PO TABS
300.0000 mg | ORAL_TABLET | Freq: Every day | ORAL | Status: DC
Start: 2021-04-25 — End: 2021-04-29
  Administered 2021-04-25 – 2021-04-28 (×4): 300 mg
  Filled 2021-04-25 (×5): qty 0.5

## 2021-04-25 MED ORDER — DILTIAZEM HCL-DEXTROSE 125-5 MG/125ML-% IV SOLN (PREMIX)
0.0000 mg/h | INTRAVENOUS | Status: DC
  Administered 2021-04-25: 5 mg/h via INTRAVENOUS
  Filled 2021-04-25: qty 125

## 2021-04-25 MED ORDER — DILTIAZEM HCL 25 MG/5ML IV SOLN
10.0000 mg | Freq: Once | INTRAVENOUS | Status: AC
  Administered 2021-04-25: 10 mg via INTRAVENOUS

## 2021-04-25 MED ORDER — DILTIAZEM HCL 25 MG/5ML IV SOLN
INTRAVENOUS | Status: AC
  Filled 2021-04-25: qty 5

## 2021-04-25 NOTE — Progress Notes (Signed)
Visit made to this patient room to speak with Judy-daughter and POA related to a non patient care concern. During this visit [name] requested to change the password on the chart. Information communicated to primary RN Raquel Sarna. She changed password on the chart.

## 2021-04-25 NOTE — Progress Notes (Signed)
NAME:  Kristin [name], MRN:  [id number], DOB:  1950/09/30, LOS: 23 ADMISSION DATE:  05/03/2021, CONSULTATION DATE:  05/07/2021 REFERRING MD: Hinda Kehr, MD CHIEF COMPLAINT: SOB    HPI  70 y.o. 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  Per ED notes and EMS run sheet, patient was found with an oxygen saturation somewhere between the 50s and 70s, obviously tired but responsive. Pt placed on CPAP en route to the ER by EMS and treated with Duonebs and IV solumedrol. However in spite of starting her on CPAP and giving her Solu-Medrol and 1 DuoNeb breathing treatment, she became less responsive during transit.  Patient was intubated in the emergency department, placed on mechanical ventilation, PCCM was consulted for evaluation and help with management   Past Medical History    Acute respiratory failure ([place]) 05/11/2021   Primary cancer of right lower lobe of lung ([place]) 02/20/2021   COPD GOLD ? / active smoker 01/23/2021   Cigarette smoker 01/23/2021   Acute on chronic respiratory failure ([place]) 01/17/2021   Junctional rhythm 01/02/2021   Acute on chronic renal failure ([place]) 01/02/2021   Tachycardia-bradycardia syndrome ([place]) 01/02/2021   Weakness     Acute hypoxemic respiratory failure ([place]) 12/23/2020   Acute exacerbation of chronic obstructive pulmonary disease (COPD) ([place]) 12/22/2020   Aspiration into airway     PAF (paroxysmal atrial fibrillation) (HCC)     CAD (coronary artery disease) 09/26/2020   Hyperkalemia 09/26/2020   CHF exacerbation ([place]) 09/26/2020   Acute on chronic respiratory failure with  hypoxia and hypercapnia ([place]) 09/25/2020   Bradycardia 09/25/2020   Lung cancer ([place]) 05/26/2020   Atrial flutter with rapid ventricular response ([place]) 05/26/2020   Demand ischemia ([place])     Acute on chronic heart failure with preserved ejection fraction (HFpEF) ([place])     Aortic stenosis-moderate to severe 03/2020     Obesity, Class III, BMI 40-49.9 (morbid obesity) ([place]) 03/29/2020   Chronic respiratory failure with hypoxia ([place]) 03/29/2020   OSA (obstructive sleep apnea) 03/29/2020   Essential hypertension 03/29/2020   Hypothyroidism 03/29/2020   Chest pain 03/29/2020   Elevated troponin 03/29/2020   Chronic diastolic (congestive) heart failure ([place]) 07/12/2018   Type 2 diabetes mellitus without complication ([place]) 92/42/6834   COPD exacerbation ([place]) 07/12/2018   Tobacco use 07/12/2018   Hypotension 07/12/2018   Chronic respiratory failure with hypoxia and hypercapnia ([place]) 07/04/2018   Morbid obesity due to excess calories ([place]) 02/08/2016    Significant Hospital Events   12/4: Admitted to the ICU with acute on chronic hypoxic hypercapnic respiratory failure. 12/5: Patient was extubated but she failed due to increased work of breathing, hypoxia and CO2 retention 12/8: Multiple bouts of Aflutter requiring initiation of amiodarone; transitioned from Levophed to Neosynephrine; CVC and arterial line placed  Consults:  PCCM  Procedures:  12/4> Intubation >> 12/5 extubation 12/5 reintubation 12/8 difficult to control Afib with RVR overnight  Subjective/interval history  Has remained in sinus rhythm to sinus brady. Fever curve downtrending on Zosyn day 4. Remains on low-dose peripheral Neo with wide pulse pressure. Not following commands but spontaneously opens eyes and moves extremities. Negative fluid balance last 24 with Lasix  IV; slight increase in BUN/CRT on AM labs.   OBJECTIVE  Blood pressure (!) 110/34, pulse 74, temperature 98.8 F (37.1 C), temperature source Axillary,  resp. rate (!) 24, height 5' 2.01" (1.575 m), weight 111 kg, SpO2 (!) 88 %.    Vent Mode: PRVC FiO2 (%):  [34 %-35 %] 35 % Set Rate:  [24 bmp] 24 bmp Vt Set:  [400 mL] 400 mL PEEP:  [10 cmH20] 10 cmH20 Plateau Pressure:  [16 cmH20-22 cmH20] 16 cmH20   Intake/Output Summary (Last 24 hours) at 04/25/2021 8756 Last data filed at 04/25/2021 4332 Gross per 24 hour  Intake 1937.11 ml  Output 2635 ml  Net -697.89 ml   Filed Weights   04/22/21 0414 04/23/21 0433 04/25/21 0500  Weight: 105.5 kg 110.1 kg 111 kg     Physical Examination    Physical exam: General: Crtitically ill-appearing obese female, orally intubated HEENT: [place]/AT, eyes anicteric.  ETT and OGT in place Neuro: Opens eyes with vocal stimuli, moving all extremities, not following commands Chest: CTA b/l, no W/C/R Heart: sinus rhythm this AM, no MRG Abdomen: Soft, obese, nondistended, bowel sounds present Skin: No rash  Labs   CBC: Recent Labs  Lab 04/24/2021 0608 04/20/21 0515 04/22/21 0419 04/24/21 0855 04/25/21 0452  WBC 15.4* 8.1 7.8 12.9* 13.3*  NEUTROABS 10.9*  --   --   --   --   HGB 10.9* 8.6* 9.4* 9.3* 9.2*  HCT 36.3 28.3* 31.4* 31.4* 30.8*  MCV 91.9 88.7 90.8 93.2 93.1  PLT 305 251 254 293 951    Basic Metabolic Panel: Recent Labs  Lab 04/20/21 0515 04/21/21 0450 04/22/21 0419 04/23/21 0245 04/23/21 1222 04/23/21 2245 04/24/21 0620 04/25/21 0452  NA 143 142 145 145  --  145 145 149*  K 4.7 4.7 4.8 4.9 4.7 4.9 5.1 4.7  CL 103 100 105 104  --  104 105 104  CO2 33* 32 33* 34*  --  36* 36* 39*  GLUCOSE 146* 157* 198* 226*  --  182* 194* 129*  BUN 35* 53* 53* 46*  --  50* 50* 64*  CREATININE 1.78* 2.19* 1.87* 1.54*  --  1.57* 1.58* 1.63*  CALCIUM 8.9 8.9 8.8* 9.0  --  8.9 8.8* 9.3  MG 2.3 2.4 2.4  --  2.2 2.2  --  2.3  PHOS 4.3 6.0* 3.9  --   --   --   --  5.4*   GFR: Estimated Creatinine Clearance: 38.3 mL/min (A) (by C-G formula based on SCr of 1.63 mg/dL (H)). Recent Labs  Lab  05/09/2021 0608 04/20/21 0515 04/21/21 0450 04/22/21 0419 04/24/21 0855 04/25/21 0452  PROCALCITON <0.10 0.53 0.43  --   --   --   WBC 15.4* 8.1  --  7.8 12.9* 13.3*  LATICACIDVEN 1.9  --   --   --   --   --     Liver Function Tests: Recent Labs  Lab 04/29/2021 0608 04/22/21 0419  AST 15 15  ALT 11 12  ALKPHOS 44 33*  BILITOT 0.6 0.6  PROT 7.5 6.9  ALBUMIN 3.4* 3.3*   No results for input(s): LIPASE, AMYLASE in the last 168 hours. No results for input(s): AMMONIA in the last 168 hours.  ABG    Component Value Date/Time   PHART 7.36 04/22/2021 0949   PCO2ART 67 (HH) 04/22/2021 0949   PO2ART 90 04/22/2021 0949   HCO3 37.9 (H) 04/22/2021 0949   O2SAT 96.6 04/22/2021 0949  Coagulation Profile: No results for input(s): INR, PROTIME in the last 168 hours.  Cardiac Enzymes: No results for input(s): CKTOTAL, CKMB, CKMBINDEX, TROPONINI in the last 168 hours.  HbA1C: Hgb A1c MFr Bld  Date/Time Value Ref Range Status  05/16/2021 06:08 AM 6.0 (H) 4.8 - 5.6 % Final    Comment:    (NOTE)         Prediabetes: 5.7 - 6.4         Diabetes: >6.4         Glycemic control for adults with diabetes: <7.0   12/23/2020 05:09 AM 5.4 4.8 - 5.6 % Final    Comment:    (NOTE) Pre diabetes:          5.7%-6.4%  Diabetes:              >6.4%  Glycemic control for   <7.0% adults with diabetes     CBG: Recent Labs  Lab 04/23/21 0733 04/23/21 1114 04/23/21 1535 04/23/21 2000 04/23/21 2350  GLUCAP 151* 80 139* 173* 147*     Assessment & Plan:  Acute on chronic hypercapnic hypoxic respiratory failure  Acute on chronic diastolic CHF exacerbation  Acute COPD exacerbation  OSA  Community-acquired pneumonia Tobacco dependence Malignant neoplasm of right lung   Continue lung protective ventilation Wean FiO2, PEEP as able; goal Pplat <=30 Goal euvolemic Continue Zosyn for 7 days; trend culture data Brovana/Pulmicort/Yupelri nebs Completed course of steroids for  COPD Chest PT Smoking cessation counseling when able to  Multifactorial shock in the setting of sedation, heart failure Severe aortic stenosis Paroxysmal atrial fibrillation Cardiology is following Not candidate for TAVR as an outpatient given active malignancy Wean Neo gtt as able; start midodrine Goal euvolemia to slightly negative fluid balance Continue Eliquis for stroke prophylaxis Continue Lopressor; avoid amiodarone given sinus pauses Continue atorvastatin   AKI on CKD-Stage IIIa Electrolyte abnormalities Likely due to cardiorenal syndrome and then aggressive diuresis Monitor I&O's / urinary output Avoid nephrotoxic agents as able Replace electrolytes as indicated and closely monitor   Diabetes type 2, complicated with diabetic Peripheral Neuropathy Monitor fingerstick with goal 140-180 Continue sliding scale insulin Hold home Metformin & Januvia Renally dose medications  Squamous cell carcinoma right lower lobe (04/2020) status post SBRT with concern for recurrence CT scan PET scan September 2022  imaging findings highly concerning for recurrent malignancy Per Oncology last visit holding off biopsy given patient's tenuous respiratory status.   On Keytruda IV infusion every 3 weeks  Outpatient follow-up with Oncology  Hypothyroidism Continue Synthroid  Anemia of chronic disease Monitor H&H and transfuse if less than 8  Morbid obesity Dietitian follow-up   Best practice:  Diet:  NPO, tube feeds VAP protocol (if indicated): Yes DVT prophylaxis: Systemic AC GI prophylaxis: PPI Glucose control:  SSI Yes Foley:  Yes, and it is still needed for close monitoring intake and output Mobility:  bed rest  PT consulted: N/A Last date of multidisciplinary goals of care discussion [12/9] Code Status:  full code Disposition: ICU    Primary Emergency Contact: MASHBURN,JUDY A, Home Phone: [phone] Wishes to pursue full aggressive treatment and intervention options,  including CPR and intubation.  After speaking with palliative care team patient's daughter has decided not to proceed with tracheostomy.  If she fails extubation then they would like to proceed with comfort care, will continue aggressive treatment for couple of days before trial of extubation  Critical care time: 36 minutes    Bennie Pierini, MD 04/25/21  7:12 AM   [facility] See Amion for pager After 7pm, Please call E-link [phone]

## 2021-04-25 NOTE — Progress Notes (Signed)
Patient had an eventful day, earlier in the shift the patient was taken off of propofol and tolerated well.  Patient was following commands and making eye contact.  Later in the shift the patient had a coughing spell and heart rate became sustained in the 140s to 150s.  Dr. Jonnie Finner at bedside and RT, patient was suctioned by RT and Dr. Jonnie Finner ordered for the patient to have one time dose of IV valium, digoxin, and diltiazem; Dr. Jonnie Finner also ordered for EKG, CXR.  Patient's tube feeding was stopped before RT suctioning and CXR.  Patients heart rate remained sustained in the 150s and Dr. Jonnie Finner ordered cardizem gtt.  Upon restarting patient's feed via tube, OG was noted to be halfway out, replaced OG and x-ray ordered by Dr. Jonnie Finner to verify placement, per x-ray OG tube needed to be advanced and at that time it was decided that OG would be removed and a new one placed once patient's B/P, HR, and O2 sats were stabilized.  Patient still had some agitation Dr. Jonnie Finner ordered a dose of valium to be given if agitation continued.  Patient settled down and is resting comfortably in the bed at this time.  Dr. Jonnie Finner also said to start precidex if needed, otherwise keep precedex off.  Will continue to monitor patient and will endorse to oncoming shift.

## 2021-04-25 NOTE — Progress Notes (Signed)
Progress Note  Patient Name: Kristin [name] Date of Encounter: 04/25/2021  [facility] Cardiologist: Ida Rogue, MD   Subjective   Intubated, sedated Labile blood pressure, the more stable on pressors -Labile heart rate paroxysmal atrial fibrillation, challenging rate control -Avoid amiodarone secondary to long termination pauses and excessive bradycardia rates in the 40s -Appears to tolerate diltiazem IV bolus Family at the bedside  Inpatient Medications    Scheduled Meds:  apixaban  5 mg Per Tube BID   arformoterol  15 mcg Nebulization BID   atorvastatin  80 mg Per Tube QHS   budesonide (PULMICORT) nebulizer solution  0.25 mg Nebulization BID   chlorhexidine gluconate (MEDLINE KIT)  15 mL Mouth Rinse BID   Chlorhexidine Gluconate Cloth  6 each Topical Q0600   docusate  100 mg Per Tube BID   ezetimibe  10 mg Per Tube Daily   famotidine  20 mg Per Tube BID   free water  150 mL Per Tube Q4H   gabapentin  300 mg Per Tube QHS   insulin aspart  0-15 Units Subcutaneous Q4H   levothyroxine  75 mcg Per Tube Q0600   mouth rinse  15 mL Mouth Rinse 10 times per day   midodrine  5 mg Per Tube TID WC   multivitamin  15 mL Per Tube Daily   polyethylene glycol  17 g Per Tube Daily   revefenacin  175 mcg Nebulization Daily   Continuous Infusions:  sodium chloride Stopped (04/23/21 1201)   dexmedetomidine (PRECEDEX) IV infusion     diltiazem (CARDIZEM) infusion 5 mg/hr (04/25/21 1553)   feeding supplement (VITAL HIGH PROTEIN) 1,000 mL (04/22/21 1425)   fentaNYL infusion INTRAVENOUS 150 mcg/hr (04/25/21 1404)   phenylephrine (NEO-SYNEPHRINE) Adult infusion 30 mcg/min (04/25/21 1404)   piperacillin-tazobactam (ZOSYN)  IV Stopped (04/25/21 1302)   propofol (DIPRIVAN) infusion Stopped (04/25/21 1150)   PRN Meds: acetaminophen, docusate, fentaNYL, ipratropium-albuterol, polyethylene glycol   Vital Signs    Vitals:   04/25/21 1545 04/25/21 1600 04/25/21 1615 04/25/21 1630   BP:  104/70 110/81 (!) 100/54  Pulse: (!) 119   (!) 128  Resp: '12 13 20 14  ' Temp:   99.2 F (37.3 C)   TempSrc:   Axillary   SpO2: 92% 92% 92% 90%  Weight:      Height:        Intake/Output Summary (Last 24 hours) at 04/25/2021 1652 Last data filed at 04/25/2021 1404 Gross per 24 hour  Intake 1837.08 ml  Output 2335 ml  Net -497.92 ml   Last 3 Weights 04/25/2021 04/23/2021 04/22/2021  Weight (lbs) 244 lb 11.4 oz 242 lb 11.6 oz 232 lb 9.4 oz  Weight (kg) 111 kg 110.1 kg 105.5 kg      Telemetry    Sinus rhythm to paroxysmal atrial fibrillation with RVR- Personally Reviewed  ECG     - Personally Reviewed  Physical Exam   Constitutional: Intubated sedated will open her eyes HENT:  Head: Grossly normal Eyes:  no discharge. No scleral icterus.  Neck: Unable to estimate JVD, no carotid bruits  Cardiovascular: Irregularly irregular, rapid Pulmonary/Chest: Coarse breath sounds bilaterally Abdominal: Soft.  no distension.   Musculoskeletal: Unable to test Neurological: Full exam not performed Skin: Skin warm and dry Psychiatric: Unable to assess   Labs    High Sensitivity Troponin:   Recent Labs  Lab 04/26/2021 0608 05/14/2021 1207 04/23/21 1222 04/23/21 1418  TROPONINIHS 12 17 22* 27*     Chemistry  Recent Labs  Lab 05/15/2021 0608 04/20/21 0515 04/22/21 0419 04/23/21 0245 04/23/21 1222 04/23/21 2245 04/24/21 0620 04/25/21 0452  NA 138   < > 145   < >  --  145 145 149*  K 4.0   < > 4.8   < > 4.7 4.9 5.1 4.7  CL 94*   < > 105   < >  --  104 105 104  CO2 35*   < > 33*   < >  --  36* 36* 39*  GLUCOSE 264*   < > 198*   < >  --  182* 194* 129*  BUN 25*   < > 53*   < >  --  50* 50* 64*  CREATININE 1.67*   < > 1.87*   < >  --  1.57* 1.58* 1.63*  CALCIUM 9.0   < > 8.8*   < >  --  8.9 8.8* 9.3  MG  --    < > 2.4  --  2.2 2.2  --  2.3  PROT 7.5  --  6.9  --   --   --   --   --   ALBUMIN 3.4*  --  3.3*  --   --   --   --   --   AST 15  --  15  --   --   --   --    --   ALT 11  --  12  --   --   --   --   --   ALKPHOS 44  --  33*  --   --   --   --   --   BILITOT 0.6  --  0.6  --   --   --   --   --   GFRNONAA 33*   < > 29*   < >  --  36* 35* 34*  ANIONGAP 9   < > 7   < >  --  5 4* 6   < > = values in this interval not displayed.    Lipids  Recent Labs  Lab 04/24/21 0620  TRIG 160*    Hematology Recent Labs  Lab 04/22/21 0419 04/24/21 0855 04/25/21 0452  WBC 7.8 12.9* 13.3*  RBC 3.46* 3.37* 3.31*  HGB 9.4* 9.3* 9.2*  HCT 31.4* 31.4* 30.8*  MCV 90.8 93.2 93.1  MCH 27.2 27.6 27.8  MCHC 29.9* 29.6* 29.9*  RDW 14.2 14.4 14.3  PLT 254 293 248   Thyroid  Recent Labs  Lab 05/06/2021 1207  TSH 0.985    BNP Recent Labs  Lab 04/27/2021 0608  BNP 245.2*    DDimer No results for input(s): DDIMER in the last 168 hours.   Radiology    DG Abd 1 View  Result Date: 04/25/2021 CLINICAL DATA:  OG tube placement EXAM: ABDOMEN - 1 VIEW COMPARISON:  04/21/2021 FINDINGS: OG tube coils in the stomach with the tip in the mid to proximal stomach. Nonobstructive bowel gas pattern. IMPRESSION: OG tube in the stomach as above. Electronically Signed   By: Rolm Baptise M.D.   On: 04/25/2021 16:22   DG Chest Port 1 View  Result Date: 04/25/2021 CLINICAL DATA:  Dyspnea EXAM: PORTABLE CHEST 1 VIEW COMPARISON:  04/23/2021 FINDINGS: ET tube terminates 3.5 cm above the carina. Enteric tube terminates just below the level of the GE junction with side port in the distal esophagus. Stable positioning of left IJ central  venous catheter. Stable cardiomegaly. Aortic atherosclerosis. Persistent interstitial prominence without new focal consolidation. No pleural effusion or pneumothorax. IMPRESSION: 1. Enteric tube terminates just below the level of the GE junction with side port in the distal esophagus. Recommend advancement approximately 7-10 cm. 2. Otherwise stable chest. Electronically Signed   By: Davina Poke D.O.   On: 04/25/2021 15:38    Cardiac Studies    Echocardiogram Ejection fraction 50 to 50%, grade 2 diastolic dysfunction, severe aortic valve stenosis with mean gradient 40 mmHg, peak gradient 61 mmHg  Patient Profile     70 y.o. female with history of COPD, lung cancer s/p radiation therapy, severe AAS, paroxysmal atrial fibrillation, nonobstructive CAD presenting with shortness of breath diagnosed with respiratory failure requiring intubation  Assessment & Plan    Paroxysmal atrial fibrillation with RVR Very challenging management, not a good candidate for amiodarone given long termination pauses and sinus bradycardia rate low 50, high 40s --would treat with diltiazem bolus 5 to 10 mg IV push as needed -Given continued elevated rate in atrial fibrillation on today's rounds, would have available diltiazem infusion 5 to 10 mg/h as needed -Would hold the diltiazem infusion for normal sinus rhythm and for heart rate less than 60 --- Will likely require diltiazem if extubation is to be successful  COPD exacerbation,  Intubated sedated, attempting to wean down on sedation Despite duo nebs, steroids, CPAP, was intubated for worsening respiratory status -Tachyarrhythmia/atrial fibrillation with RVR may pose a challenge, plan as above with diltiazem infusion and boluses  lung cancer Receiving treatment x1 with oncology following Squamous cell carcinoma right lower lung  Critical aortic valve stenosis In her current state, not a good candidate for TAVR Aortic valve stenosis contributing to challenge controlling atrial fibrillation, respiratory distress   Very challenging patient given labile nature of her arrhythmia, heart rate, blood pressure, respiratory status  Total encounter time more than 35 minutes  Greater than 50% was spent in counseling and coordination of care with the patient  For questions or updates, please contact [facility] Please consult [email or web address] for contact info under        Signed, Ida Rogue,  MD  04/25/2021, 4:52 PM

## 2021-04-25 NOTE — Plan of Care (Signed)
Continuing with plan of care. 

## 2021-04-25 NOTE — Consult Note (Signed)
PHARMACY CONSULT NOTE  Pharmacy Consult for Electrolyte Monitoring and Replacement   Recent Labs: Potassium (mmol/L)  Date Value  04/25/2021 4.7   Magnesium (mg/dL)  Date Value  04/25/2021 2.3   Calcium (mg/dL)  Date Value  04/25/2021 9.3   Albumin (g/dL)  Date Value  04/22/2021 3.3 (L)   Phosphorus (mg/dL)  Date Value  04/25/2021 5.4 (H)   Sodium (mmol/L)  Date Value  04/25/2021 149 (H)   Assessment: Patient is a 70 y/o F with medical history including HFpEF, AS, Afib, HTN, chronic respiratory failure, tobacco abuse, COPD, OSA, hypothyroidism, diabetes, anxiety, GERD, lung cancer who is admitted with acute on chronic respiratory failure requiring intubation and mechanical ventilation. Pharmacy consulted to assist with electrolyte replacement as indicated.   Patient extubated 12/5. Failed BiPAP. Ultimately re-intubated 12/6  Goal of Therapy:  Electrolytes within normal limits  Plan:  --No electrolyte replacement indicated at this time. Potassium at ULN. Scr stable. Will continue to monitor Phosphorus for now. --Follow-up electrolytes with AM labs tomorrow  Pearla Dubonnet 04/25/2021 8:59 AM

## 2021-04-26 DIAGNOSIS — R0902 Hypoxemia: Secondary | ICD-10-CM

## 2021-04-26 LAB — CBC
HCT: 30.6 % — ABNORMAL LOW (ref 36.0–46.0)
Hemoglobin: 9.1 g/dL — ABNORMAL LOW (ref 12.0–15.0)
MCH: 27.2 pg (ref 26.0–34.0)
MCHC: 29.7 g/dL — ABNORMAL LOW (ref 30.0–36.0)
MCV: 91.6 fL (ref 80.0–100.0)
Platelets: 247 10*3/uL (ref 150–400)
RBC: 3.34 MIL/uL — ABNORMAL LOW (ref 3.87–5.11)
RDW: 14.4 % (ref 11.5–15.5)
WBC: 13.6 10*3/uL — ABNORMAL HIGH (ref 4.0–10.5)
nRBC: 0 % (ref 0.0–0.2)

## 2021-04-26 LAB — BASIC METABOLIC PANEL
Anion gap: 8 (ref 5–15)
BUN: 57 mg/dL — ABNORMAL HIGH (ref 8–23)
CO2: 39 mmol/L — ABNORMAL HIGH (ref 22–32)
Calcium: 8.8 mg/dL — ABNORMAL LOW (ref 8.9–10.3)
Chloride: 99 mmol/L (ref 98–111)
Creatinine, Ser: 1.52 mg/dL — ABNORMAL HIGH (ref 0.44–1.00)
GFR, Estimated: 37 mL/min — ABNORMAL LOW (ref >60)
Glucose, Bld: 139 mg/dL — ABNORMAL HIGH (ref 70–99)
Potassium: 4 mmol/L (ref 3.5–5.1)
Sodium: 146 mmol/L — ABNORMAL HIGH (ref 135–145)

## 2021-04-26 LAB — PHOSPHORUS: Phosphorus: 4.6 mg/dL (ref 2.5–4.6)

## 2021-04-26 LAB — MAGNESIUM: Magnesium: 1.8 mg/dL (ref 1.7–2.4)

## 2021-04-26 MED ORDER — MAGNESIUM SULFATE 2 GM/50ML IV SOLN
2.0000 g | Freq: Once | INTRAVENOUS | Status: AC
  Administered 2021-04-26: 2 g via INTRAVENOUS
  Filled 2021-04-26: qty 50

## 2021-04-26 MED ORDER — FUROSEMIDE 10 MG/ML IJ SOLN
60.0000 mg | Freq: Once | INTRAMUSCULAR | Status: AC
  Administered 2021-04-26: 60 mg via INTRAVENOUS
  Filled 2021-04-26: qty 6

## 2021-04-26 MED ORDER — MIDODRINE HCL 5 MG PO TABS
10.0000 mg | ORAL_TABLET | Freq: Three times a day (TID) | ORAL | Status: DC
Start: 2021-04-26 — End: 2021-04-29
  Administered 2021-04-26 – 2021-04-29 (×9): 10 mg
  Filled 2021-04-26 (×9): qty 2

## 2021-04-26 NOTE — Consult Note (Signed)
PHARMACY CONSULT NOTE  Pharmacy Consult for Electrolyte Monitoring and Replacement   Recent Labs: Potassium (mmol/L)  Date Value  04/26/2021 4.0   Magnesium (mg/dL)  Date Value  04/26/2021 1.8   Calcium (mg/dL)  Date Value  04/26/2021 8.8 (L)   Albumin (g/dL)  Date Value  04/22/2021 3.3 (L)   Phosphorus (mg/dL)  Date Value  04/26/2021 4.6   Sodium (mmol/L)  Date Value  04/26/2021 146 (H)   Assessment: Patient is a 70 y/o F with medical history including HFpEF, AS, Afib, HTN, chronic respiratory failure, tobacco abuse, COPD, OSA, hypothyroidism, diabetes, anxiety, GERD, lung cancer who is admitted with acute on chronic respiratory failure requiring intubation and mechanical ventilation. Pharmacy consulted to assist with electrolyte replacement as indicated.   Patient extubated 12/5. Failed BiPAP. Ultimately re-intubated 12/6  Goal of Therapy:  Electrolytes within normal limits  Plan:  --Mag sulfate 2g IV x 1 dose ordered. Scr stable.  --Follow-up electrolytes with AM labs tomorrow  Pearla Dubonnet 04/26/2021 7:28 AM

## 2021-04-26 NOTE — Progress Notes (Signed)
Progress Note  Patient Name: Kristin [name] Date of Encounter: 04/26/2021  [facility] Cardiologist: Ida Rogue, MD   Subjective   Intubated, sedated Discussed with nurses, there is family at bedside Family would prefer no extubation today, " more time to treat pneumonia" Patient will open her eyes, some purposeful movement of limbs Through hospitalization has had labile pressures, more stable on pressors and with sedation --- Labile arrhythmia paroxysmal atrial fibrillation with rapid rates -Avoiding amiodarone secondary to long termination pauses in excess of bradycardia rates in the 40s -Has been treated with diltiazem IV boluses and infusion Appears diltiazem pill added to NG around-the-clock  Inpatient Medications    Scheduled Meds:  apixaban  5 mg Per Tube BID   arformoterol  15 mcg Nebulization BID   atorvastatin  80 mg Per Tube QHS   budesonide (PULMICORT) nebulizer solution  0.25 mg Nebulization BID   chlorhexidine gluconate (MEDLINE KIT)  15 mL Mouth Rinse BID   Chlorhexidine Gluconate Cloth  6 each Topical Q0600   diltiazem  30 mg Oral Q6H   docusate  100 mg Per Tube BID   ezetimibe  10 mg Per Tube Daily   famotidine  20 mg Per Tube BID   free water  150 mL Per Tube Q4H   gabapentin  300 mg Per Tube QHS   insulin aspart  0-15 Units Subcutaneous Q4H   levothyroxine  75 mcg Per Tube Q0600   mouth rinse  15 mL Mouth Rinse 10 times per day   midodrine  10 mg Per Tube TID WC   multivitamin  15 mL Per Tube Daily   polyethylene glycol  17 g Per Tube Daily   revefenacin  175 mcg Nebulization Daily   Continuous Infusions:  sodium chloride Stopped (04/23/21 1201)   diltiazem (CARDIZEM) infusion Stopped (04/25/21 2236)   feeding supplement (VITAL HIGH PROTEIN) 1,000 mL (04/22/21 1425)   fentaNYL infusion INTRAVENOUS 200 mcg/hr (04/26/21 1814)   phenylephrine (NEO-SYNEPHRINE) Adult infusion 30 mcg/min (04/26/21 1814)   piperacillin-tazobactam (ZOSYN)  IV 12.5  mL/hr at 04/26/21 1814   PRN Meds: acetaminophen, docusate, fentaNYL, ipratropium-albuterol, polyethylene glycol   Vital Signs    Vitals:   04/26/21 1530 04/26/21 1600 04/26/21 1630 04/26/21 1800  BP:   (!) 84/39 102/64  Pulse: (!) 56 69    Resp: 15 (!) 22 (!) 21 16  Temp:  99.8 F (37.7 C)    TempSrc:  Axillary    SpO2: 98% (!) 79%  94%  Weight:      Height:        Intake/Output Summary (Last 24 hours) at 04/26/2021 1824 Last data filed at 04/26/2021 1814 Gross per 24 hour  Intake 1813.53 ml  Output 4450 ml  Net -2636.47 ml   Last 3 Weights 04/26/2021 04/25/2021 04/23/2021  Weight (lbs) 245 lb 2.4 oz 244 lb 11.4 oz 242 lb 11.6 oz  Weight (kg) 111.2 kg 111 kg 110.1 kg      Telemetry    Maintaining normal sinus rhythm- Personally Reviewed  ECG     - Personally Reviewed  Physical Exam   Constitutional: Intubated sedated HENT:  Head: Grossly normal Eyes:  no discharge. No scleral icterus.  Neck: Unable to estimate JVD, no carotid bruits  Cardiovascular: Normal sinus rhythm, no murmurs Pulmonary/Chest: Coarse breath sounds bilaterally Abdominal: Soft.  no distension.  no tenderness.  Musculoskeletal: Normal range of motion Neurological:  normal muscle tone. Coordination normal. No atrophy Skin: Skin warm and dry  Psychiatric: Sedated   Labs    High Sensitivity Troponin:   Recent Labs  Lab 04/18/2021 0608 05/14/2021 1207 04/23/21 1222 04/23/21 1418  TROPONINIHS 12 17 22* 27*     Chemistry Recent Labs  Lab 04/22/21 0419 04/23/21 0245 04/23/21 2245 04/24/21 0620 04/25/21 0452 04/26/21 0416  NA 145   < > 145 145 149* 146*  K 4.8   < > 4.9 5.1 4.7 4.0  CL 105   < > 104 105 104 99  CO2 33*   < > 36* 36* 39* 39*  GLUCOSE 198*   < > 182* 194* 129* 139*  BUN 53*   < > 50* 50* 64* 57*  CREATININE 1.87*   < > 1.57* 1.58* 1.63* 1.52*  CALCIUM 8.8*   < > 8.9 8.8* 9.3 8.8*  MG 2.4   < > 2.2  --  2.3 1.8  PROT 6.9  --   --   --   --   --   ALBUMIN 3.3*  --    --   --   --   --   AST 15  --   --   --   --   --   ALT 12  --   --   --   --   --   ALKPHOS 33*  --   --   --   --   --   BILITOT 0.6  --   --   --   --   --   GFRNONAA 29*   < > 36* 35* 34* 37*  ANIONGAP 7   < > 5 4* 6 8   < > = values in this interval not displayed.    Lipids  Recent Labs  Lab 04/24/21 0620  TRIG 160*    Hematology Recent Labs  Lab 04/24/21 0855 04/25/21 0452 04/26/21 0416  WBC 12.9* 13.3* 13.6*  RBC 3.37* 3.31* 3.34*  HGB 9.3* 9.2* 9.1*  HCT 31.4* 30.8* 30.6*  MCV 93.2 93.1 91.6  MCH 27.6 27.8 27.2  MCHC 29.6* 29.9* 29.7*  RDW 14.4 14.3 14.4  PLT 293 248 247   Thyroid  No results for input(s): TSH, FREET4 in the last 168 hours.   BNP No results for input(s): BNP, PROBNP in the last 168 hours.   DDimer No results for input(s): DDIMER in the last 168 hours.   Radiology    DG Abd 1 View  Result Date: 04/25/2021 CLINICAL DATA:  Enteric catheter placement EXAM: ABDOMEN - 1 VIEW COMPARISON:  04/25/2021 FINDINGS: Frontal view of the lower chest and upper abdomen demonstrates enteric catheter passing below diaphragm, tip and side port project over the gastric fundus. External defibrillator pads overlie the left chest. Bowel gas pattern is unremarkable. IMPRESSION: 1. Enteric catheter tip projecting over the gastric fundus. Electronically Signed   By: Randa Ngo M.D.   On: 04/25/2021 19:41   DG Abd 1 View  Result Date: 04/25/2021 CLINICAL DATA:  OG tube placement EXAM: ABDOMEN - 1 VIEW COMPARISON:  04/21/2021 FINDINGS: OG tube coils in the stomach with the tip in the mid to proximal stomach. Nonobstructive bowel gas pattern. IMPRESSION: OG tube in the stomach as above. Electronically Signed   By: Rolm Baptise M.D.   On: 04/25/2021 16:22   DG Chest Port 1 View  Result Date: 04/25/2021 CLINICAL DATA:  Dyspnea EXAM: PORTABLE CHEST 1 VIEW COMPARISON:  04/23/2021 FINDINGS: ET tube terminates 3.5 cm above the carina. Enteric tube terminates  just  below the level of the GE junction with side port in the distal esophagus. Stable positioning of left IJ central venous catheter. Stable cardiomegaly. Aortic atherosclerosis. Persistent interstitial prominence without new focal consolidation. No pleural effusion or pneumothorax. IMPRESSION: 1. Enteric tube terminates just below the level of the GE junction with side port in the distal esophagus. Recommend advancement approximately 7-10 cm. 2. Otherwise stable chest. Electronically Signed   By: Davina Poke D.O.   On: 04/25/2021 15:38    Cardiac Studies   Echocardiogram Ejection fraction 50 to 26%, grade 2 diastolic dysfunction, severe aortic valve stenosis with mean gradient 40 mmHg, peak gradient 61 mmHg  Patient Profile     70 y.o. female with history of COPD, lung cancer s/p radiation therapy, severe AAS, paroxysmal atrial fibrillation, nonobstructive CAD presenting with shortness of breath diagnosed with respiratory failure requiring intubation  Assessment & Plan    Paroxysmal atrial fibrillation with RVR Very challenging management, not a good candidate for amiodarone given long termination pauses and sinus bradycardia rate low 50, high 40s --- In the setting of sedation, doing well on diltiazem 30 mg every 6 --- Would likely be difficult to wean off the ventilator and will have paroxysmal atrial fibrillation with rapid rate --In this case would treat with diltiazem boluses by IV 5 to 10 mg as needed --- May also need diltiazem infusion with attempt at extubation for rate control -Would avoid amiodarone given prolonged termination pauses, excessive bradycardia  COPD exacerbation,  Intubated sedated,  Will be very difficult to wean off the ventilator given her arrhythmia issues, aortic valve stenosis, COPD -If that becomes the case, may need trach  lung cancer Receiving treatment x1 with oncology following Squamous cell carcinoma right lower lung Second treatment delayed as she  is intubated  Critical aortic valve stenosis In her current state, not a good candidate for TAVR Aortic valve stenosis contributing to challenge controlling atrial fibrillation, respiratory distress  Palliative needs to be considered  Case discussed with nursing, family at bedside Very challenging given multiorgan system failure above  Total encounter time more than 35 minutes  Greater than 50% was spent in counseling and coordination of care with the patient  For questions or updates, please contact [facility] Please consult [email or web address] for contact info under        Signed, Ida Rogue, MD  04/26/2021, 6:24 PM

## 2021-04-26 NOTE — Plan of Care (Signed)
Continuing with plan of care. 

## 2021-04-26 NOTE — Progress Notes (Signed)
Pt HR bradying to 30s with a 8 beat pause. MD made aware. Resolved to mid 50s rate.

## 2021-04-26 NOTE — Progress Notes (Signed)
NAME:  Kristin [name], MRN:  [id number], DOB:  1950/09/25, LOS: 15 ADMISSION DATE:  04/20/2021, CONSULTATION DATE:  05/01/2021 REFERRING MD: Hinda Kehr, MD CHIEF COMPLAINT: SOB    HPI  70 y.o. 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  Per ED notes and EMS run sheet, patient was found with an oxygen saturation somewhere between the 50s and 70s, obviously tired but responsive. Pt placed on CPAP en route to the ER by EMS and treated with Duonebs and IV solumedrol. However in spite of starting her on CPAP and giving her Solu-Medrol and 1 DuoNeb breathing treatment, she became less responsive during transit.  Patient was intubated in the emergency department, placed on mechanical ventilation, PCCM was consulted for evaluation and help with management   Past Medical History    Acute respiratory failure ([place]) 05/09/2021   Primary cancer of right lower lobe of lung ([place]) 02/20/2021   COPD GOLD ? 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   Weakness     Acute hypoxemic respiratory failure ([place]) 12/23/2020   Acute exacerbation of chronic obstructive pulmonary disease (COPD) ([place]) 12/22/2020   Aspiration into airway     PAF (paroxysmal atrial fibrillation) ([facility])     CAD (coronary artery disease) 09/26/2020   Hyperkalemia 09/26/2020   CHF exacerbation ([place]) 09/26/2020   Acute on chronic respiratory failure with  hypoxia and hypercapnia ([place]) 09/25/2020   Bradycardia 09/25/2020   Lung cancer ([place]) 05/26/2020   Atrial flutter with rapid ventricular response ([place]) 05/26/2020   Demand ischemia ([place])     Acute on chronic heart failure with preserved ejection fraction (HFpEF) ([place])     Aortic stenosis-moderate to severe 03/2020     Obesity, Class III, BMI 40-49.9 (morbid obesity) ([place]) 03/29/2020   Chronic respiratory failure with hypoxia ([place]) 03/29/2020   OSA (obstructive sleep apnea) 03/29/2020   Essential hypertension 03/29/2020   Hypothyroidism 03/29/2020   Chest pain 03/29/2020   Elevated troponin 03/29/2020   Chronic diastolic (congestive) heart failure ([place]) 07/12/2018   Type 2 diabetes mellitus without complication ([place]) 83/15/1761   COPD exacerbation ([place]) 07/12/2018   Tobacco use 07/12/2018   Hypotension 07/12/2018   Chronic respiratory failure with hypoxia and hypercapnia ([place]) 07/04/2018   Morbid obesity due to excess calories ([place]) 02/08/2016    Significant Hospital Events   12/4: Admitted to the ICU with acute on chronic hypoxic hypercapnic respiratory failure. 12/5: Patient was extubated but she failed due to increased work of breathing, hypoxia and CO2 retention 12/8: Multiple bouts of Aflutter requiring initiation of amiodarone; transitioned from Levophed to Neosynephrine; CVC and arterial line placed  Consults:  PCCM  Procedures:  12/4> Intubation >> 12/5 extubation 12/5 reintubation 12/8 difficult to control Afib with RVR overnight 12/10 recurrent Afib w/ RVR after coughing; required digoxin and dilt gtt  Subjective/interval history  She is back in sinus rhythm. Dilt drip is off and oral diltiazem has been added. Renal function continues to improve. She remains on low-dose phenylephrine infusion, however, SBP  remains above goal. Diuresing well. Today is day 5 of Zosyn for pneumonia. Now off propofol.  OBJECTIVE  Blood pressure (!) 121/94, pulse 60, temperature 98.8 F  (37.1 C), temperature source Axillary, resp. rate 15, height 5' 2.01" (1.575 m), weight 111.2 kg, SpO2 96 %.    Vent Mode: PRVC FiO2 (%):  [35 %-40 %] 35 % Set Rate:  [15 bmp] 15 bmp Vt Set:  [400 mL] 400 mL PEEP:  [5 cmH20] 5 cmH20 Plateau Pressure:  [24 cmH20] 24 cmH20   Intake/Output Summary (Last 24 hours) at 04/26/2021 0911 Last data filed at 04/26/2021 0800 Gross per 24 hour  Intake 2158.55 ml  Output 4458 ml  Net -2299.45 ml   Filed Weights   04/23/21 0433 04/25/21 0500 04/26/21 0500  Weight: 110.1 kg 111 kg 111.2 kg     Physical Examination    Physical exam: General: Crtitically ill-appearing obese female, orally intubated HEENT: [place]/AT, eyes anicteric.  ETT and OGT in place Neuro: Opens eyes with vocal stimuli, moving all extremities, follows commands Chest: CTA b/l, no W/C/R Heart: sinus rhythm this AM, no MRG Abdomen: Soft, obese, nondistended, bowel sounds present Skin: No rash  Labs   CBC: Recent Labs  Lab 04/20/21 0515 04/22/21 0419 04/24/21 0855 04/25/21 0452 04/26/21 0416  WBC 8.1 7.8 12.9* 13.3* 13.6*  HGB 8.6* 9.4* 9.3* 9.2* 9.1*  HCT 28.3* 31.4* 31.4* 30.8* 30.6*  MCV 88.7 90.8 93.2 93.1 91.6  PLT 251 254 293 248 433    Basic Metabolic Panel: Recent Labs  Lab 04/20/21 0515 04/21/21 0450 04/22/21 0419 04/23/21 0245 04/23/21 1222 04/23/21 2245 04/24/21 0620 04/25/21 0452 04/26/21 0416  NA 143 142 145 145  --  145 145 149* 146*  K 4.7 4.7 4.8 4.9 4.7 4.9 5.1 4.7 4.0  CL 103 100 105 104  --  104 105 104 99  CO2 33* 32 33* 34*  --  36* 36* 39* 39*  GLUCOSE 146* 157* 198* 226*  --  182* 194* 129* 139*  BUN 35* 53* 53* 46*  --  50* 50* 64* 57*  CREATININE 1.78* 2.19* 1.87* 1.54*  --  1.57* 1.58* 1.63* 1.52*  CALCIUM 8.9 8.9 8.8* 9.0  --  8.9 8.8* 9.3 8.8*  MG 2.3 2.4 2.4  --  2.2 2.2  --  2.3 1.8  PHOS 4.3 6.0* 3.9  --   --   --   --  5.4* 4.6   GFR: Estimated Creatinine Clearance: 41.1 mL/min (A) (by C-G formula based on SCr of  1.52 mg/dL (H)). Recent Labs  Lab 04/20/21 0515 04/21/21 0450 04/22/21 0419 04/24/21 0855 04/25/21 0452 04/25/21 0911 04/26/21 0416  PROCALCITON 0.53 0.43  --   --   --   --   --   WBC 8.1  --  7.8 12.9* 13.3*  --  13.6*  LATICACIDVEN  --   --   --   --   --  0.7  --     Liver Function Tests: Recent Labs  Lab 04/22/21 0419  AST 15  ALT 12  ALKPHOS 33*  BILITOT 0.6  PROT 6.9  ALBUMIN 3.3*   No results for input(s): LIPASE, AMYLASE in the last 168 hours. No results for input(s): AMMONIA in the last 168 hours.  ABG    Component Value Date/Time   PHART 7.36 04/22/2021 0949   PCO2ART 67 (HH) 04/22/2021 0949   PO2ART 90 04/22/2021 0949   HCO3 37.9 (H) 04/22/2021 2951  O2SAT 96.6 04/22/2021 0949     Coagulation Profile: No results for input(s): INR, PROTIME in the last 168 hours.  Cardiac Enzymes: No results for input(s): CKTOTAL, CKMB, CKMBINDEX, TROPONINI in the last 168 hours.  HbA1C: Hgb A1c MFr Bld  Date/Time Value Ref Range Status  04/24/2021 06:08 AM 6.0 (H) 4.8 - 5.6 % Final    Comment:    (NOTE)         Prediabetes: 5.7 - 6.4         Diabetes: >6.4         Glycemic control for adults with diabetes: <7.0   12/23/2020 05:09 AM 5.4 4.8 - 5.6 % Final    Comment:    (NOTE) Pre diabetes:          5.7%-6.4%  Diabetes:              >6.4%  Glycemic control for   <7.0% adults with diabetes     CBG: Recent Labs  Lab 04/23/21 0733 04/23/21 1114 04/23/21 1535 04/23/21 2000 04/23/21 2350  GLUCAP 151* 80 139* 173* 147*     Assessment & Plan:  Acute on chronic hypercapnic hypoxic respiratory failure  Acute on chronic diastolic CHF exacerbation  Acute COPD exacerbation  OSA  Community-acquired pneumonia Tobacco dependence Malignant neoplasm of right lung   Continue lung protective ventilation Wean FiO2, PEEP as able; goal Pplat <=30 Goal euvolemia to slightly negative fluid balance Continue Zosyn for 7 days; trend culture  data Brovana/Pulmicort/Yupelri nebs Completed course of steroids for COPD Chest PT Smoking cessation counseling when able to  Multifactorial shock in the setting of sedation, heart failure Severe aortic stenosis Paroxysmal atrial fibrillation Cardiology is following Not candidate for TAVR as an outpatient given active malignancy Wean Neo gtt as able; increase midodrine Goal euvolemia to slightly negative fluid balance Continue Eliquis for stroke prophylaxis Continue PO diltiazem Avoid amiodarone given sinus pauses Continue atorvastatin   AKI on CKD-Stage IIIa Electrolyte abnormalities Likely due to cardiorenal syndrome and then aggressive diuresis Monitor I&O's / urinary output Avoid nephrotoxic agents as able Replace electrolytes as indicated and closely monitor   Diabetes type 2, complicated with diabetic Peripheral Neuropathy Monitor fingerstick with goal 140-180 Continue sliding scale insulin Hold home Metformin & Januvia Renally dose medications  Squamous cell carcinoma right lower lobe (04/2020) status post SBRT with concern for recurrence CT scan PET scan September 2022  imaging findings highly concerning for recurrent malignancy Per Oncology last visit holding off biopsy given patient's tenuous respiratory status.   On Keytruda IV infusion every 3 weeks  Outpatient follow-up with Oncology  Hypothyroidism Continue Synthroid  Anemia of chronic disease Monitor H&H and transfuse if less than 8  Morbid obesity Dietitian follow-up   Best practice:  Diet:  NPO, tube feeds VAP protocol (if indicated): Yes DVT prophylaxis: Systemic AC GI prophylaxis: PPI Glucose control:  SSI Yes Foley:  Yes, and it is still needed for close monitoring intake and output Mobility:  bed rest  PT consulted: N/A Last date of multidisciplinary goals of care discussion [12/9] Code Status:  full code Disposition: ICU    Primary Emergency Contact: MASHBURN,JUDY A, Home Phone:  [phone] Wishes to pursue full aggressive treatment and intervention options, including CPR and intubation.  After speaking with palliative care team patient's daughter has decided not to proceed with tracheostomy. She would like to wait for her pneumonia to be completely treated prior to extubation. Goal is Home with Hospice.  Critical care time: 36 minutes  Bennie Pierini, MD 04/26/21 9:11 AM   [facility] See Amion for pager After 7pm, Please call E-link [phone]

## 2021-04-27 LAB — PHOSPHORUS: Phosphorus: 4.4 mg/dL (ref 2.5–4.6)

## 2021-04-27 LAB — CBC
HCT: 31.5 % — ABNORMAL LOW (ref 36.0–46.0)
Hemoglobin: 9.6 g/dL — ABNORMAL LOW (ref 12.0–15.0)
MCH: 27.7 pg (ref 26.0–34.0)
MCHC: 30.5 g/dL (ref 30.0–36.0)
MCV: 91 fL (ref 80.0–100.0)
Platelets: 223 K/uL (ref 150–400)
RBC: 3.46 MIL/uL — ABNORMAL LOW (ref 3.87–5.11)
RDW: 14.1 % (ref 11.5–15.5)
WBC: 16.9 K/uL — ABNORMAL HIGH (ref 4.0–10.5)
nRBC: 0 % (ref 0.0–0.2)

## 2021-04-27 LAB — BASIC METABOLIC PANEL
Anion gap: 7 (ref 5–15)
BUN: 49 mg/dL — ABNORMAL HIGH (ref 8–23)
CO2: 40 mmol/L — ABNORMAL HIGH (ref 22–32)
Calcium: 8.9 mg/dL (ref 8.9–10.3)
Chloride: 95 mmol/L — ABNORMAL LOW (ref 98–111)
Creatinine, Ser: 1.46 mg/dL — ABNORMAL HIGH (ref 0.44–1.00)
GFR, Estimated: 39 mL/min — ABNORMAL LOW (ref >60)
Glucose, Bld: 164 mg/dL — ABNORMAL HIGH (ref 70–99)
Potassium: 3.9 mmol/L (ref 3.5–5.1)
Sodium: 142 mmol/L (ref 135–145)

## 2021-04-27 LAB — MAGNESIUM: Magnesium: 2.4 mg/dL (ref 1.7–2.4)

## 2021-04-27 MED ORDER — VITAL HIGH PROTEIN PO LIQD
1000.0000 mL | ORAL | Status: DC
  Administered 2021-04-27 – 2021-04-29 (×3): 1000 mL

## 2021-04-27 NOTE — Progress Notes (Signed)
Progress Note  Patient Name: Kristin [name] Date of Encounter: 04/27/2021  Primary Cardiologist: Ida Rogue, MD  Subjective   Intubated, sedated.  Inpatient Medications    Scheduled Meds:  apixaban  5 mg Per Tube BID   arformoterol  15 mcg Nebulization BID   atorvastatin  80 mg Per Tube QHS   budesonide (PULMICORT) nebulizer solution  0.25 mg Nebulization BID   chlorhexidine gluconate (MEDLINE KIT)  15 mL Mouth Rinse BID   Chlorhexidine Gluconate Cloth  6 each Topical Q0600   diltiazem  30 mg Oral Q6H   docusate  100 mg Per Tube BID   ezetimibe  10 mg Per Tube Daily   famotidine  20 mg Per Tube BID   free water  150 mL Per Tube Q4H   gabapentin  300 mg Per Tube QHS   insulin aspart  0-15 Units Subcutaneous Q4H   levothyroxine  75 mcg Per Tube Q0600   mouth rinse  15 mL Mouth Rinse 10 times per day   midodrine  10 mg Per Tube TID WC   multivitamin  15 mL Per Tube Daily   polyethylene glycol  17 g Per Tube Daily   revefenacin  175 mcg Nebulization Daily   Continuous Infusions:  sodium chloride 10 mL/hr at 04/27/21 1100   diltiazem (CARDIZEM) infusion Stopped (04/25/21 2236)   feeding supplement (VITAL HIGH PROTEIN) 1,000 mL (04/22/21 1425)   fentaNYL infusion INTRAVENOUS 200 mcg/hr (04/27/21 1100)   phenylephrine (NEO-SYNEPHRINE) Adult infusion 33 mcg/min (04/27/21 1100)   PRN Meds: acetaminophen, docusate, fentaNYL, ipratropium-albuterol, polyethylene glycol   Vital Signs    Vitals:   04/27/21 0900 04/27/21 1000 04/27/21 1100 04/27/21 1121  BP:      Pulse: (!) 58 (!) 55 61   Resp: '13 13 15   ' Temp:      TempSrc:      SpO2: 92% 92% 93% 96%  Weight:      Height:        Intake/Output Summary (Last 24 hours) at 04/27/2021 1202 Last data filed at 04/27/2021 1100 Gross per 24 hour  Intake 3824.59 ml  Output 3355 ml  Net 469.59 ml   Filed Weights   04/25/21 0500 04/26/21 0500 04/27/21 0500  Weight: 111 kg 111.2 kg 108.6 kg    Physical Exam   GEN:   Obese, intubated/sedated. HEENT: Grossly normal.  Neck: Supple, obese, difficult to gauge JVP.  No carotid bruits or masses. Cardiac: RRR, 3/6 SEM throughout - loudest @ USBs.  No rubs or gallops. No clubbing, cyanosis, edema.  Radials 2+, DP/PT 2+ and equal bilaterally.  Respiratory:  Respirations regular and unlabored, diminished/coarse. GI: Obese, soft, nontender, nondistended, BS + x 4. MS: no deformity or atrophy. Skin: warm and dry, no rash. Neuro:  sedated/unresponsive. Psych: sedated/unresponsive  Labs    Chemistry Recent Labs  Lab 04/22/21 0419 04/23/21 0245 04/25/21 0452 04/26/21 0416 04/27/21 0457  NA 145   < > 149* 146* 142  K 4.8   < > 4.7 4.0 3.9  CL 105   < > 104 99 95*  CO2 33*   < > 39* 39* 40*  GLUCOSE 198*   < > 129* 139* 164*  BUN 53*   < > 64* 57* 49*  CREATININE 1.87*   < > 1.63* 1.52* 1.46*  CALCIUM 8.8*   < > 9.3 8.8* 8.9  PROT 6.9  --   --   --   --   ALBUMIN 3.3*  --   --   --   --  AST 15  --   --   --   --   ALT 12  --   --   --   --   ALKPHOS 33*  --   --   --   --   BILITOT 0.6  --   --   --   --   GFRNONAA 29*   < > 34* 37* 39*  ANIONGAP 7   < > '6 8 7   ' < > = values in this interval not displayed.     Hematology Recent Labs  Lab 04/25/21 0452 04/26/21 0416 04/27/21 0457  WBC 13.3* 13.6* 16.9*  RBC 3.31* 3.34* 3.46*  HGB 9.2* 9.1* 9.6*  HCT 30.8* 30.6* 31.5*  MCV 93.1 91.6 91.0  MCH 27.8 27.2 27.7  MCHC 29.9* 29.7* 30.5  RDW 14.3 14.4 14.1  PLT 248 247 223    Cardiac Enzymes  Recent Labs  Lab 04/27/2021 0608 04/20/2021 1207 04/23/21 1222 04/23/21 1418  TROPONINIHS 12 17 22* 27*     Lipids  Lab Results  Component Value Date   CHOL 165 06/27/2020   HDL 61 06/27/2020   LDLCALC 91 06/27/2020   TRIG 160 (H) 04/24/2021   CHOLHDL 2.7 06/27/2020    HbA1c  Lab Results  Component Value Date   HGBA1C 6.0 (H) 04/22/2021    Radiology    DG Abd 1 View  Result Date: 04/25/2021 CLINICAL DATA:  Enteric catheter placement  EXAM: ABDOMEN - 1 VIEW COMPARISON:  04/25/2021 FINDINGS: Frontal view of the lower chest and upper abdomen demonstrates enteric catheter passing below diaphragm, tip and side port project over the gastric fundus. External defibrillator pads overlie the left chest. Bowel gas pattern is unremarkable. IMPRESSION: 1. Enteric catheter tip projecting over the gastric fundus. Electronically Signed   By: Randa Ngo M.D.   On: 04/25/2021 19:41   DG Abd 1 View  Result Date: 04/25/2021 CLINICAL DATA:  OG tube placement EXAM: ABDOMEN - 1 VIEW COMPARISON:  04/21/2021 FINDINGS: OG tube coils in the stomach with the tip in the mid to proximal stomach. Nonobstructive bowel gas pattern. IMPRESSION: OG tube in the stomach as above. Electronically Signed   By: Rolm Baptise M.D.   On: 04/25/2021 16:22   DG Chest Port 1 View  Result Date: 04/25/2021 CLINICAL DATA:  Dyspnea EXAM: PORTABLE CHEST 1 VIEW COMPARISON:  04/23/2021 FINDINGS: ET tube terminates 3.5 cm above the carina. Enteric tube terminates just below the level of the GE junction with side port in the distal esophagus. Stable positioning of left IJ central venous catheter. Stable cardiomegaly. Aortic atherosclerosis. Persistent interstitial prominence without new focal consolidation. No pleural effusion or pneumothorax. IMPRESSION: 1. Enteric tube terminates just below the level of the GE junction with side port in the distal esophagus. Recommend advancement approximately 7-10 cm. 2. Otherwise stable chest. Electronically Signed   By: Davina Poke D.O.   On: 04/25/2021 15:38   DG Chest Port 1 View  Result Date: 04/23/2021 CLINICAL DATA:  Acute respiratory distress.  Shock. EXAM: PORTABLE CHEST 1 VIEW COMPARISON:  04/22/2021 FINDINGS: The endotracheal tube tip is 3 cm above the carina. Nasogastric tube enters the stomach. Atherosclerotic calcification of the aortic arch. Left IJ central venous catheter tip: Cavoatrial junction. No appreciable pneumothorax.  Thoracic spondylosis. Upper zone pulmonary vascular prominence may be due to supine positioning. No discrete airspace opacity is identified. Mild central airway thickening. Borderline enlargement of the cardiopericardial silhouette. IMPRESSION: 1. Airway thickening is present, suggesting  bronchitis or reactive airways disease. 2.  Aortic Atherosclerosis (ICD10-I70.0). 3. A new left IJ central venous catheter is present with tip at the cavoatrial junction. Tubes and lines appear satisfactorily position. Electronically Signed   By: Van Clines M.D.   On: 04/23/2021 14:31    Telemetry    Sinus rhythm w/ PAF followed by 4.14 sec post-termination pause.  Up to 12 beats WCT/NSVT - Personally Reviewed  Cardiac Studies   2D Echocardiogram 12.1.2022   1. Left ventricular ejection fraction, by estimation, is 50 to 55%. The  left ventricle has low normal function. The left ventricle has no regional  wall motion abnormalities. Left ventricular diastolic parameters are  consistent with Grade II diastolic  dysfunction (pseudonormalization).   2. Right ventricular systolic function is low normal. The right  ventricular size is not well visualized.   3. Left atrial size was moderately dilated.   4. The mitral valve is degenerative. No evidence of mitral valve  regurgitation. Moderate mitral annular calcification.   5. Aortic valve mean gradient = 57mHg, peak gradient 665mg, Vmax  3.28m57m DVI= 0.23, AVA 0.7cm2. . The aortic valve is calcified. Aortic  valve regurgitation is not visualized. Severe aortic valve stenosis.   6. The inferior vena cava is normal in size with greater than 50%  respiratory variability, suggesting right atrial pressure of 3 mmHg.   Patient Profile     [age]. female with history of COPD, lung cancer s/p radiation therapy, severe AAS, paroxysmal atrial fibrillation, nonobstructive CAD presenting with shortness of breath diagnosed with respiratory failure requiring  intubation  Assessment & Plan    1.  AECOPD/Acute on chronic hypoxic resp failure: Remains intubated/sedated - per CCM.  2.  PAF w/ RVR/Post-termination pauses/SSS/Tachybrady syndrome:  H/o PAF and post-termination pauses requiring d/c of amio/? blocker as outpt.  She prev saw EP who rec conservative approach given comorbidities, inactivity, and limited symptoms.  Cont to have intermittent Afib in the setting of above.  4.14 sec pause this AM (similar such pauses seen on outpt monitoring earlier this Fall).  Cont short acting dilt/eliquis.  Avoiding amio.  3.  Critical AoV stenosis:  followed by C. McAlhany in structural heart clinic as outpt.  Poor candidate for TAVR in light of comorbidities.  4.  Lung Cancer:  RLL squamous cell carcinoma.  Followed by oncology as outpt.  5.  Essential HTN/Active hypotension:  req pressors.  6.  CAD:  Nonobs dzs.  7.  DMII: per CCM.  8.  CKD III:  creat stable.  Signed, ChrMurray HodgkinsP  04/27/2021, 12:02 PM    For questions or updates, please contact   Please consult [email or web address] for contact info under Cardiology/STEMI.

## 2021-04-27 NOTE — Progress Notes (Signed)
NAME:  Kristin [name], MRN:  [id number], DOB:  12/05/1950, LOS: 46 ADMISSION DATE:  05/14/2021, CONSULTATION DATE:  05/12/2021 REFERRING MD: Hinda Kehr, MD CHIEF COMPLAINT: SOB    HPI  70 y.o. 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  Per ED notes and EMS run sheet, patient was found with an oxygen saturation somewhere between the 50s and 70s, obviously tired but responsive. Pt placed on CPAP en route to the ER by EMS and treated with Duonebs and IV solumedrol. However in spite of starting her on CPAP and giving her Solu-Medrol and 1 DuoNeb breathing treatment, she became less responsive during transit.  Patient was intubated in the emergency department, placed on mechanical ventilation, PCCM was consulted for evaluation and help with management   Past Medical History    Acute respiratory failure ([place]) 04/18/2021   Primary cancer of right lower lobe of lung ([place]) 02/20/2021   COPD GOLD ? / active smoker 01/23/2021   Cigarette smoker 01/23/2021   Acute on chronic respiratory failure ([place]) 01/17/2021   Junctional rhythm 01/02/2021   Acute on chronic renal failure ([place]) 01/02/2021   Tachycardia-bradycardia syndrome ([place]) 01/02/2021   Weakness     Acute hypoxemic respiratory failure ([place]) 12/23/2020   Acute exacerbation of chronic obstructive pulmonary disease (COPD) ([place]) 12/22/2020   Aspiration into airway     PAF (paroxysmal atrial fibrillation) ([facility])     CAD (coronary artery disease) 09/26/2020   Hyperkalemia 09/26/2020   CHF exacerbation ([place]) 09/26/2020   Acute on chronic respiratory failure with  hypoxia and hypercapnia ([place]) 09/25/2020   Bradycardia 09/25/2020   Lung cancer ([place]) 05/26/2020   Atrial flutter with rapid ventricular response ([place]) 05/26/2020   Demand ischemia ([place])     Acute on chronic heart failure with preserved ejection fraction (HFpEF) ([place])     Aortic stenosis-moderate to severe 03/2020     Obesity, Class III, BMI 40-49.9 (morbid obesity) ([place]) 03/29/2020   Chronic respiratory failure with hypoxia ([place]) 03/29/2020   OSA (obstructive sleep apnea) 03/29/2020   Essential hypertension 03/29/2020   Hypothyroidism 03/29/2020   Chest pain 03/29/2020   Elevated troponin 03/29/2020   Chronic diastolic (congestive) heart failure ([place]) 07/12/2018   Type 2 diabetes mellitus without complication ([place]) 21/94/7125   COPD exacerbation ([place]) 07/12/2018   Tobacco use 07/12/2018   Hypotension 07/12/2018   Chronic respiratory failure with hypoxia and hypercapnia ([place]) 07/04/2018   Morbid obesity due to excess calories ([place]) 02/08/2016    Significant Hospital Events   12/4: Admitted to the ICU with acute on chronic hypoxic hypercapnic respiratory failure. 12/5: Patient was extubated but she failed due to increased work of breathing, hypoxia and CO2 retention 12/8: Multiple bouts of Aflutter requiring initiation of amiodarone; transitioned from Levophed to Neosynephrine; CVC and arterial line placed 12/11 severe resp failure 12/12 remains on vent, severe AS  Consults:  PCCM  Procedures:  12/4> Intubation >> 12/5 extubation 12/5 reintubation 12/8 difficult to control Afib with RVR overnight 12/10 recurrent Afib w/ RVR after coughing; required digoxin and dilt gtt  Subjective/interval history  Remains on vent Remains on pressors Cardiogenic shock Multiorgan failure and failure to wean from vent  OBJECTIVE  Blood pressure (!) 104/30, pulse (!) 58, temperature 100.3 F (37.9 C), temperature source Axillary, resp. rate 13, height 5' 2.01" (1.575 m), weight 108.6 kg,  SpO2 91 %.    Vent Mode: PRVC FiO2 (%):  [35 %] 35 % Set Rate:  [15 bmp] 15 bmp Vt Set:  [400 mL] 400 mL PEEP:  [5 cmH20] 5 cmH20 Plateau Pressure:  [15 cmH20-17 cmH20] 15 cmH20   Intake/Output Summary (Last 24 hours) at 04/27/2021 0859 Last data filed at 04/27/2021 0800 Gross per 24 hour  Intake 3886.29 ml  Output 3355 ml  Net 531.29 ml    Filed Weights   04/25/21 0500 04/26/21 0500 04/27/21 0500  Weight: 111 kg 111.2 kg 108.6 kg     REVIEW OF SYSTEMS  PATIENT IS UNABLE TO PROVIDE COMPLETE REVIEW OF SYSTEMS DUE TO SEVERE CRITICAL ILLNESS AND TOXIC METABOLIC ENCEPHALOPATHY    PHYSICAL EXAMINATION:  GENERAL:critically ill appearing, +resp distress EYES: Pupils equal, round, reactive to light.  No scleral icterus.  MOUTH: Moist mucosal membrane. INTUBATED NECK: Supple.  PULMONARY: +rhonchi, +wheezing CARDIOVASCULAR: S1 and S2.  No murmurs  GASTROINTESTINAL: Soft, nontender, -distended. Positive bowel sounds.  MUSCULOSKELETAL: No swelling, clubbing, or edema.  NEUROLOGIC: obtunded SKIN:intact,warm,dry   Labs   CBC: Recent Labs  Lab 04/22/21 0419 04/24/21 0855 04/25/21 0452 04/26/21 0416 04/27/21 0457  WBC 7.8 12.9* 13.3* 13.6* 16.9*  HGB 9.4* 9.3* 9.2* 9.1* 9.6*  HCT 31.4* 31.4* 30.8* 30.6* 31.5*  MCV 90.8 93.2 93.1 91.6 91.0  PLT 254 293 248 247 223     Basic Metabolic Panel: Recent Labs  Lab 04/21/21 0450 04/22/21 0419 04/23/21 0245 04/23/21 1222 04/23/21 2245 04/24/21 0620 04/25/21 0452 04/26/21 0416 04/27/21 0457  NA 142 145   < >  --  145 145 149* 146* 142  K 4.7 4.8   < > 4.7 4.9 5.1 4.7 4.0 3.9  CL 100 105   < >  --  104 105 104 99 95*  CO2 32 33*   < >  --  36* 36* 39* 39* 40*  GLUCOSE 157* 198*   < >  --  182* 194* 129* 139* 164*  BUN 53* 53*   < >  --  50* 50* 64* 57* 49*  CREATININE 2.19* 1.87*   < >  --  1.57* 1.58* 1.63* 1.52* 1.46*  CALCIUM 8.9 8.8*   < >  --  8.9 8.8* 9.3 8.8* 8.9  MG 2.4 2.4  --  2.2 2.2  --  2.3 1.8 2.4   PHOS 6.0* 3.9  --   --   --   --  5.4* 4.6 4.4   < > = values in this interval not displayed.    GFR: Estimated Creatinine Clearance: 42.2 mL/min (A) (by C-G formula based on SCr of 1.46 mg/dL (H)). Recent Labs  Lab 04/21/21 0450 04/22/21 0419 04/24/21 0855 04/25/21 0452 04/25/21 0911 04/26/21 0416 04/27/21 0457  PROCALCITON 0.43  --   --   --   --   --   --   WBC  --    < > 12.9* 13.3*  --  13.6* 16.9*  LATICACIDVEN  --   --   --   --  0.7  --   --    < > = values in this interval not displayed.     Liver Function Tests: Recent Labs  Lab 04/22/21 0419  AST 15  ALT 12  ALKPHOS 33*  BILITOT 0.6  PROT  6.9  ALBUMIN 3.3*   ABG    Component Value Date/Time   PHART 7.36 04/22/2021 0949   PCO2ART 67 (HH) 04/22/2021 0949   PO2ART 90 04/22/2021 0949   HCO3 37.9 (H) 04/22/2021 0949   O2SAT 96.6 04/22/2021 0949       Assessment & Plan:  22 morbidly obese white female with Acute on chronic hypercapnic hypoxic respiratory failure  Acute on chronic diastolic CHF exacerbation, severe AS with Acute COPD exacerbation  With underlying OSA with Community-acquired pneumonia,Tobacco dependence H/o Malignant neoplasm of right lung    .Severe ACUTE Hypoxic and Hypercapnic Respiratory Failure -continue Mechanical Ventilator support -continue Bronchodilator Therapy -Wean Fio2 and PEEP as tolerated -VAP/VENT bundle implementation -will NOT perform SAT/SBT when respiratory parameters are met Vent Mode: PRVC FiO2 (%):  [35 %] 35 % Set Rate:  [15 bmp] 15 bmp Vt Set:  [400 mL] 400 mL PEEP:  [5 cmH20] 5 cmH20 Plateau Pressure:  [15 cmH20-17 cmH20] 15 cmH20   Multifactorial shock in the setting of sedation, heart failure Severe aortic stenosis Paroxysmal atrial fibrillation CARDIOGENIC SHOCK Cardiology is following Not candidate for TAVR as an outpatient given active malignancy increase midodrine Continue Eliquis for stroke prophylaxis Continue diltiazem Avoid amiodarone  given sinus pauses Continue atorvastatin    ACUTE KIDNEY INJURY/Renal Failure  CKD-Stage IIIa -continue Foley Catheter-assess need -Avoid nephrotoxic agents -Follow urine output, BMP -Ensure adequate renal perfusion, optimize oxygenation -Renal dose medications   Intake/Output Summary (Last 24 hours) at 04/27/2021 0902 Last data filed at 04/27/2021 0800 Gross per 24 hour  Intake 3886.29 ml  Output 3355 ml  Net 531.29 ml   ENDO - ICU hypoglycemic\Hyperglycemia protocol -check FSBS per protocol   GI GI PROPHYLAXIS as indicated  NUTRITIONAL STATUS DIET-->TF's as tolerated Constipation protocol as indicated   LUNG CANCER Squamous cell carcinoma right lower lobe (04/2020) status post SBRT with concern for recurrence CT scan PET scan September 2022  imaging findings highly concerning for recurrent malignancy Per Oncology last visit holding off biopsy given patient's tenuous respiratory status.   On Keytruda IV infusion every 3 weeks  Outpatient follow-up with Oncology     Best practice:  Diet:  NPO, tube feeds VAP protocol (if indicated): Yes DVT prophylaxis: Systemic AC GI prophylaxis: PPI Glucose control:  SSI Yes Foley:  Yes, and it is still needed for close monitoring intake and output Mobility:  bed rest  PT consulted: N/A Last date of multidisciplinary goals of care discussion [12/9] Code Status:  full code Disposition: ICU     DVT/GI PRX  assessed I Assessed the need for Labs I Assessed the need for Foley I Assessed the need for Central Venous Line Family Discussion when available I Assessed the need for Mobilization I made an Assessment of medications to be adjusted accordingly Safety Risk assessment completed  CASE DISCUSSED IN MULTIDISCIPLINARY ROUNDS WITH ICU TEAM     Critical Care Time devoted to patient care services described in this note is 55  minutes.  Critical care was necessary to treat /prevent imminent and life-threatening  deterioration. Overall, patient is critically ill, prognosis is guarded.  Patient with Multiorgan failure and at high risk for cardiac arrest and death.    Corrin Parker, M.D.  [facility]  Medical Director [place] Director [facility] Cardio-Pulmonary Department

## 2021-04-27 NOTE — Consult Note (Signed)
PHARMACY CONSULT NOTE  Pharmacy Consult for Electrolyte Monitoring and Replacement   Recent Labs: Potassium (mmol/L)  Date Value  04/27/2021 3.9   Magnesium (mg/dL)  Date Value  04/27/2021 2.4   Calcium (mg/dL)  Date Value  04/27/2021 8.9   Albumin (g/dL)  Date Value  04/22/2021 3.3 (L)   Phosphorus (mg/dL)  Date Value  04/27/2021 4.4   Sodium (mmol/L)  Date Value  04/27/2021 142   Assessment: Patient is a 70 y/o F with medical history including HFpEF, AS, Afib, HTN, chronic respiratory failure, tobacco abuse, COPD, OSA, hypothyroidism, diabetes, anxiety, GERD, lung cancer who is admitted with acute on chronic respiratory failure requiring intubation and mechanical ventilation. Pharmacy consulted to assist with electrolyte replacement as indicated.   Patient extubated 12/5. Failed BiPAP. Ultimately re-intubated 12/6  Labs on 12/12 Na 142 K 3.9 Phos 4.4 Mg 2.4  Goal of Therapy:  Electrolytes within normal limits  Plan:  --No repletion is indicated at this time --Will continue to monitor electrolytes and will f/u with AM labs  Narda Rutherford, PharmD Pharmacy Resident  04/27/2021 11:29 AM

## 2021-04-27 NOTE — Progress Notes (Signed)
GOALS OF CARE DISCUSSION  The Clinical status was relayed to family in detail. Daughter Kristin [name]   Updated and notified of patients medical condition.    Patient remains unresponsive and will not open eyes to command.   Patient is having a weak cough and struggling to remove secretions.   Patient with increased WOB and using accessory muscles to breathe Explained to family course of therapy and the modalities  Severe respiratory and  cardiac failure with active lung cancer Prognosis is grave Patient is suffering and dying process  Patient with Progressive multiorgan failure with a very high probablity of a very minimal chance of meaningful recovery despite all aggressive and optimal medical therapy.  PATIENT REMAINS FULL CODE     Family are satisfied with Plan of action and management. All questions answered  Additional CC time 35 mins   [name] Patricia Pesa, M.D.  [facility]  Medical Director [place] Director [facility] Cardio-Pulmonary Department

## 2021-04-27 NOTE — Progress Notes (Signed)
Nutrition Follow Up Note   DOCUMENTATION CODES:   Morbid obesity  INTERVENTION:   Increase Vital HP to 28m/hr continuous   Free water flushes 1586mq4 hours per MD  Regimen provides 1440kcal/day, 126g/day protein and 210459may free water   Liquid MVI daily via tube   NUTRITION DIAGNOSIS:   Inadequate oral intake related to inability to eat (pt sedated and ventilated) as evidenced by NPO status.  GOAL:   Provide needs based on ASPEN/SCCM guidelines -met with tube feeds  MONITOR:   Vent status, Labs, Weight trends, TF tolerance, Skin, I & O's  ASSESSMENT:   [age]o. 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 fib on Eliquis who [facility] due to acute respiratory distress.  Pt tolerating tube feeds well at goal rate. Refeed labs stable. Per chart, pt up ~6lbs from her UBW. Pt -1.3L on her I & Os. Palliative care following; family does not want trach or PEG.   Medications reviewed and include: colace, pepcid, insulin, MVI, synthroid, MVI, miralax, phenylephrine   Labs reviewed: K 3.9 wnl, BUN 49(H), creat 1.46(H), P 4.4 wnl, Mg 2.4 wnl Wbc- 16.9(H), Hgb 9.6(L), Hct 31.5(L)  Patient is currently intubated on ventilator support MV: 7.2 L/min Temp (24hrs), Avg:100.2 F (37.9 C), Min:99.6 F (37.6 C), Max:100.5 F (38.1 C)  Propofol: none   MAP- >49m54m  UOP- [id number] Order:   Diet Order             Diet NPO time specified  Diet effective now                  EDUCATION NEEDS:   No education needs have been identified at this time  Skin:  Skin Assessment:  Reviewed RN Assessment  Last BM:  12/12- TYPE 6  Height:   Ht Readings from Last 1 Encounters:  04/27/21 5' 2.01" (1.575 m)    Weight:   Wt Readings from Last 1 Encounters:  04/27/21 108.6 kg    Ideal Body Weight:  50 kg  BMI:  Body mass index is 43.78 kg/m.  Estimated Nutritional Needs:   Kcal:  1139-1449kcal/day  Protein:  >100g/day  Fluid:  1.3-1.5L/day  CaseyKoleen DistanceRD, LDN Please refer to [facility] and/or RD on-call/weekend/after hours pager

## 2021-04-27 NOTE — Progress Notes (Signed)
Palliative: Kristin [name] is lying quietly in bed.  She is intubated/ventilated and sedated.  She appears acutely/chronically ill and quite frail.  She has known stage IV cancer.  Her daughters are at bedside.  Daughter Kristin [name] is healthcare power of attorney, daughter Kristin [name] is present also.    We talked about Kristin [name]'s acute and chronic health concerns.  We talked about ventilator support.  Kristin [name] and Kristin [name] ask about the treatment plan for pneumonia, which we discussed.  We talked about facing choices soon about extubation without reintubation versus trach/PEG.  Both Kristin [name] and Kristin [name] endorse that Kristin [name] would not want a trach, and to live in a nursing home.  We talked about Kristin [name]'s cancer, and Kristin [name] shares with Kristin [name] that the cancer is stage IV.  We talked about increased heart rate and additional medications for her heart.  At this point Kristin [name] states that she would like 2 more days for Kristin [name] to be able to recover if at all possible.  Kristin [name] states that if this is not allowed, she will move Kristin [name].  At this point Kristin [name] and Kristin [name] start having interpersonal discussions about their mother and her care.  Conference with attending, bedside nursing staff, transition of care team related to patient condition, needs, goals of care, disposition.  Plan: Family is requesting full scope/full code for at least 48 hours more.  Both Kristin [name] and Kristin [name] endorse no trach. Prognosis: Guarded, days to weeks anticipated.  94 minutes Kristin Axe, NP Palliative medicine team Team phone [phone] Greater than 50% of this time was spent counseling and coordinating care related to the above assessment and plan.

## 2021-04-28 ENCOUNTER — Inpatient Hospital Stay: Payer: Medicare Other

## 2021-04-28 ENCOUNTER — Encounter: Payer: Self-pay | Admitting: Internal Medicine

## 2021-04-28 DIAGNOSIS — J189 Pneumonia, unspecified organism: Secondary | ICD-10-CM

## 2021-04-28 DIAGNOSIS — R410 Disorientation, unspecified: Secondary | ICD-10-CM

## 2021-04-28 DIAGNOSIS — C3490 Malignant neoplasm of unspecified part of unspecified bronchus or lung: Secondary | ICD-10-CM

## 2021-04-28 LAB — BASIC METABOLIC PANEL
Anion gap: 5 (ref 5–15)
BUN: 49 mg/dL — ABNORMAL HIGH (ref 8–23)
CO2: 40 mmol/L — ABNORMAL HIGH (ref 22–32)
Calcium: 8.8 mg/dL — ABNORMAL LOW (ref 8.9–10.3)
Chloride: 100 mmol/L (ref 98–111)
Creatinine, Ser: 1.14 mg/dL — ABNORMAL HIGH (ref 0.44–1.00)
GFR, Estimated: 52 mL/min — ABNORMAL LOW (ref >60)
Glucose, Bld: 173 mg/dL — ABNORMAL HIGH (ref 70–99)
Potassium: 4 mmol/L (ref 3.5–5.1)
Sodium: 145 mmol/L (ref 135–145)

## 2021-04-28 LAB — CBC
HCT: 29.1 % — ABNORMAL LOW (ref 36.0–46.0)
Hemoglobin: 8.7 g/dL — ABNORMAL LOW (ref 12.0–15.0)
MCH: 27.4 pg (ref 26.0–34.0)
MCHC: 29.9 g/dL — ABNORMAL LOW (ref 30.0–36.0)
MCV: 91.8 fL (ref 80.0–100.0)
Platelets: 203 10*3/uL (ref 150–400)
RBC: 3.17 MIL/uL — ABNORMAL LOW (ref 3.87–5.11)
RDW: 14.3 % (ref 11.5–15.5)
WBC: 14 10*3/uL — ABNORMAL HIGH (ref 4.0–10.5)
nRBC: 0 % (ref 0.0–0.2)

## 2021-04-28 LAB — GLUCOSE, CAPILLARY
Glucose-Capillary: 166 mg/dL — ABNORMAL HIGH (ref 70–99)
Glucose-Capillary: 173 mg/dL — ABNORMAL HIGH (ref 70–99)
Glucose-Capillary: 126 mg/dL — ABNORMAL HIGH (ref 70–99)
Glucose-Capillary: 142 mg/dL — ABNORMAL HIGH (ref 70–99)
Glucose-Capillary: 172 mg/dL — ABNORMAL HIGH (ref 70–99)
Glucose-Capillary: 186 mg/dL — ABNORMAL HIGH (ref 70–99)
Glucose-Capillary: 165 mg/dL — ABNORMAL HIGH (ref 70–99)

## 2021-04-28 LAB — MAGNESIUM: Magnesium: 2.1 mg/dL (ref 1.7–2.4)

## 2021-04-28 LAB — PHOSPHORUS: Phosphorus: 3.3 mg/dL (ref 2.5–4.6)

## 2021-04-28 MED ORDER — EPINEPHRINE 1 MG/10ML IJ SOSY
PREFILLED_SYRINGE | INTRAMUSCULAR | Status: AC
  Filled 2021-04-28: qty 10

## 2021-04-28 MED ORDER — PIPERACILLIN-TAZOBACTAM 3.375 G IVPB
3.3750 g | Freq: Three times a day (TID) | INTRAVENOUS | Status: DC
  Administered 2021-04-28 – 2021-04-29 (×3): 3.375 g via INTRAVENOUS
  Filled 2021-04-28 (×3): qty 50

## 2021-04-28 MED ORDER — HYDROMORPHONE HCL 1 MG/ML IJ SOLN
INTRAMUSCULAR | Status: AC
  Administered 2021-04-28: 2 mg via INTRAVENOUS
  Filled 2021-04-28: qty 4

## 2021-04-28 MED ORDER — PROPOFOL 1000 MG/100ML IV EMUL
5.0000 ug/kg/min | INTRAVENOUS | Status: DC
  Administered 2021-04-29: 03:00:00 12 ug/kg/min via INTRAVENOUS
  Administered 2021-04-29: 12:00:00 20 ug/kg/min via INTRAVENOUS
  Filled 2021-04-28 (×2): qty 100

## 2021-04-28 MED ORDER — IOHEXOL 300 MG/ML  SOLN
75.0000 mL | Freq: Once | INTRAMUSCULAR | Status: AC | PRN
  Administered 2021-04-28: 75 mL via INTRAVENOUS

## 2021-04-28 MED ORDER — PROPOFOL 1000 MG/100ML IV EMUL
INTRAVENOUS | Status: AC
  Administered 2021-04-28: 40 ug/kg/min via INTRAVENOUS
  Filled 2021-04-28: qty 100

## 2021-04-28 MED ORDER — NOREPINEPHRINE 16 MG/250ML-% IV SOLN
0.0000 ug/min | INTRAVENOUS | Status: DC
  Administered 2021-04-28: 20 ug/min via INTRAVENOUS
  Administered 2021-04-28: 10 ug/min via INTRAVENOUS
  Filled 2021-04-28 (×2): qty 250

## 2021-04-28 MED ORDER — HYDROMORPHONE HCL 1 MG/ML IJ SOLN: 2.0000 mg | INTRAMUSCULAR | Status: AC

## 2021-04-28 MED ORDER — HYDROMORPHONE HCL 1 MG/ML IJ SOLN
2.0000 mg | Freq: Once | INTRAMUSCULAR | Status: AC
  Administered 2021-04-28: 2 mg via INTRAVENOUS

## 2021-04-28 MED ORDER — EPINEPHRINE PF 1 MG/ML IJ SOLN
INTRAMUSCULAR | Status: AC
  Filled 2021-04-28: qty 1

## 2021-04-28 MED ORDER — SODIUM CHLORIDE 0.9 % IV SOLN
2.0000 mg/h | INTRAVENOUS | Status: DC
  Administered 2021-04-28: 17:00:00 2 mg/h via INTRAVENOUS
  Administered 2021-04-29: 22:00:00 6 mg/h via INTRAVENOUS
  Administered 2021-04-29: 14:00:00 2 mg/h via INTRAVENOUS
  Filled 2021-04-28 (×3): qty 5

## 2021-04-28 NOTE — Progress Notes (Signed)
Pt was transported to CT and back to CCU while on the vent.

## 2021-04-28 NOTE — Progress Notes (Signed)
Palliative:  Kristin [name] is lying quietly in bed.  She is intubated/ventilated.  She is not weaning.  There is no family at bedside at this time.   Conference with bedside nursing staff.  PMT to follow.   Plan:  At this point continue full scope/code. Daughter is requesting 24 hours more for outcomes.   15 minutes  [name], NP Palliative Medicine Team  Team Phone [phone] Greater than 50% of this time was spent counseling and coordinating  care related to the above assessment and plan.

## 2021-04-28 NOTE — Consult Note (Signed)
PHARMACY CONSULT NOTE  Pharmacy Consult for Electrolyte Monitoring and Replacement   Recent Labs: Potassium (mmol/L)  Date Value  04/28/2021 4.0   Magnesium (mg/dL)  Date Value  04/28/2021 2.1   Calcium (mg/dL)  Date Value  04/28/2021 8.8 (L)   Albumin (g/dL)  Date Value  04/22/2021 3.3 (L)   Phosphorus (mg/dL)  Date Value  04/28/2021 3.3   Sodium (mmol/L)  Date Value  04/28/2021 145   Assessment: Patient is a 70 y/o F with medical history including HFpEF, AS, Afib, HTN, chronic respiratory failure, tobacco abuse, COPD, OSA, hypothyroidism, diabetes, anxiety, GERD, lung cancer who is admitted with acute on chronic respiratory failure requiring intubation and mechanical ventilation. Pharmacy consulted to assist with electrolyte replacement as indicated.   Patient extubated 12/5. Failed BiPAP. Ultimately re-intubated 12/6  Labs Na 142>145 K 3.9>4.0 Phos 4.4>3.3 Mg 2.4>2.1  Goal of Therapy:  Electrolytes within normal limits  Plan:  --No repletion is indicated at this time --Will continue to monitor electrolytes and will f/u with AM labs  Narda Rutherford, PharmD Pharmacy Resident  04/28/2021 8:12 AM

## 2021-04-28 NOTE — Progress Notes (Signed)
NAME:  Kristin [name], MRN:  [id number], DOB:  10-04-1950, LOS: 57 ADMISSION DATE:  05/02/2021, CONSULTATION DATE:  05/06/2021 REFERRING MD: Hinda Kehr, MD CHIEF COMPLAINT: SOB    HPI  70 y.o. 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  Per ED notes and EMS run sheet, patient was found with an oxygen saturation somewhere between the 50s and 70s, obviously tired but responsive. Pt placed on CPAP en route to the ER by EMS and treated with Duonebs and IV solumedrol. However in spite of starting her on CPAP and giving her Solu-Medrol and 1 DuoNeb breathing treatment, she became less responsive during transit.  Patient was intubated in the emergency department, placed on mechanical ventilation, PCCM was consulted for evaluation and help with management   Past Medical History    Acute respiratory failure ([place]) 05/02/2021   Primary cancer of right lower lobe of lung ([place]) 02/20/2021   COPD GOLD ? 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   Weakness     Acute hypoxemic respiratory failure ([place]) 12/23/2020   Acute exacerbation of chronic obstructive pulmonary disease (COPD) ([place]) 12/22/2020   Aspiration into airway     PAF (paroxysmal atrial fibrillation) ([facility])     CAD (coronary artery disease) 09/26/2020   Hyperkalemia 09/26/2020   CHF exacerbation ([place]) 09/26/2020   Acute on chronic respiratory failure with  hypoxia and hypercapnia ([place]) 09/25/2020   Bradycardia 09/25/2020   Lung cancer ([place]) 05/26/2020   Atrial flutter with rapid ventricular response ([place]) 05/26/2020   Demand ischemia ([place])     Acute on chronic heart failure with preserved ejection fraction (HFpEF) ([place])     Aortic stenosis-moderate to severe 03/2020     Obesity, Class III, BMI 40-49.9 (morbid obesity) ([place]) 03/29/2020   Chronic respiratory failure with hypoxia ([place]) 03/29/2020   OSA (obstructive sleep apnea) 03/29/2020   Essential hypertension 03/29/2020   Hypothyroidism 03/29/2020   Chest pain 03/29/2020   Elevated troponin 03/29/2020   Chronic diastolic (congestive) heart failure ([place]) 07/12/2018   Type 2 diabetes mellitus without complication ([place]) 65/07/5463   COPD exacerbation ([place]) 07/12/2018   Tobacco use 07/12/2018   Hypotension 07/12/2018   Chronic respiratory failure with hypoxia and hypercapnia ([place]) 07/04/2018   Morbid obesity due to excess calories ([place]) 02/08/2016    Significant Hospital Events   12/4: Admitted to the ICU with acute on chronic hypoxic hypercapnic respiratory failure. 12/5: Patient was extubated but she failed due to increased work of breathing, hypoxia and CO2 retention 12/8: Multiple bouts of Aflutter requiring initiation of amiodarone; transitioned from Levophed to Neosynephrine; CVC and arterial line placed 12/11 severe resp failure 12/12 remains on vent, severe AS 12/13 relatively static course overnight  Consults:  PCCM  Procedures:  12/4> Intubation >> 12/5 extubation 12/5 reintubation 12/8 difficult to control Afib with RVR overnight 12/10 recurrent Afib w/ RVR after coughing; required digoxin and dilt gtt  Subjective/interval history  Remains on vent Remains on pressors Cardiogenic shock Multiorgan failure and failure to wean  from vent  OBJECTIVE  Blood pressure (!) 123/23, pulse (!) 45, temperature 100.2 F (37.9 C), temperature source Axillary, resp. rate 15,  height 5' 2.01" (1.575 m), weight 108.2 kg, SpO2 91 %.    Vent Mode: PRVC FiO2 (%):  [35 %] 35 % Set Rate:  [15 bmp] 15 bmp Vt Set:  [400 mL] 400 mL PEEP:  [5 cmH20] 5 cmH20   Intake/Output Summary (Last 24 hours) at 04/28/2021 1154 Last data filed at 04/28/2021 1000 Gross per 24 hour  Intake 2909.05 ml  Output 1380 ml  Net 1529.05 ml    Filed Weights   04/26/21 0500 04/27/21 0500 04/28/21 0452  Weight: 111.2 kg 108.6 kg 108.2 kg     REVIEW OF SYSTEMS  PATIENT IS UNABLE TO PROVIDE COMPLETE REVIEW OF SYSTEMS DUE TO SEVERE CRITICAL ILLNESS AND TOXIC METABOLIC ENCEPHALOPATHY  Physical Exam Vitals reviewed.  Constitutional:      General: She is not in acute distress.    Appearance: She is obese. She is ill-appearing. She is not toxic-appearing.     Interventions: She is intubated.  HENT:     Head: Normocephalic.     Right Ear: External ear normal.     Left Ear: External ear normal.     Nose: Nose normal.     Mouth/Throat:     Mouth: Mucous membranes are moist.  Eyes:     Extraocular Movements: Extraocular movements intact.     Pupils: Pupils are equal, round, and reactive to light.  Cardiovascular:     Rate and Rhythm: Bradycardia present. Occasional Extrasystoles are present.    Pulses: No decreased pulses.     Heart sounds: Murmur heard.  Systolic murmur is present with a grade of 2/6.  Pulmonary:     Effort: Prolonged expiration present. No respiratory distress. She is intubated.     Breath sounds: Examination of the right-lower field reveals decreased breath sounds. Examination of the left-lower field reveals decreased breath sounds. Decreased breath sounds and rhonchi present. No wheezing or rales.  Abdominal:     General: Abdomen is protuberant. Bowel sounds are normal. There is no distension.     Palpations: Abdomen is soft.     Tenderness: There is no abdominal tenderness. There is no guarding or rebound.  Musculoskeletal:     Right lower leg: Edema present.      Left lower leg: Edema present.  Neurological:     Mental Status: She is lethargic.     GCS: GCS eye subscore is 2. GCS motor subscore is 4.     Comments: Sedated for ETT/MV     Labs   CBC: Recent Labs  Lab 04/24/21 0855 04/25/21 0452 04/26/21 0416 04/27/21 0457 04/28/21 0505  WBC 12.9* 13.3* 13.6* 16.9* 14.0*  HGB 9.3* 9.2* 9.1* 9.6* 8.7*  HCT 31.4* 30.8* 30.6* 31.5* 29.1*  MCV 93.2 93.1 91.6 91.0 91.8  PLT 293 248 247 223 203     Basic Metabolic Panel: Recent Labs  Lab 04/22/21 0419 04/23/21 0245 04/23/21 2245 04/24/21 0620 04/25/21 0452 04/26/21 0416 04/27/21 0457 04/28/21 0505  NA 145   < > 145 145 149* 146* 142 145  K 4.8   < > 4.9 5.1 4.7 4.0 3.9 4.0  CL 105   < > 104 105 104 99 95* 100  CO2 33*   < > 36* 36* 39* 39* 40* 40*  GLUCOSE 198*   < > 182* 194* 129* 139* 164* 173*  BUN 53*   < >  50* 50* 64* 57* 49* 49*  CREATININE 1.87*   < > 1.57* 1.58* 1.63* 1.52* 1.46* 1.14*  CALCIUM 8.8*   < > 8.9 8.8* 9.3 8.8* 8.9 8.8*  MG 2.4   < > 2.2  --  2.3 1.8 2.4 2.1  PHOS 3.9  --   --   --  5.4* 4.6 4.4 3.3   < > = values in this interval not displayed.    GFR: Estimated Creatinine Clearance: 53.9 mL/min (A) (by C-G formula based on SCr of 1.14 mg/dL (H)). Recent Labs  Lab 04/25/21 0452 04/25/21 0911 04/26/21 0416 04/27/21 0457 04/28/21 0505  WBC 13.3*  --  13.6* 16.9* 14.0*  LATICACIDVEN  --  0.7  --   --   --      Liver Function Tests: Recent Labs  Lab 04/22/21 0419  AST 15  ALT 12  ALKPHOS 33*  BILITOT 0.6  PROT 6.9  ALBUMIN 3.3*   ABG    Component Value Date/Time   PHART 7.36 04/22/2021 0949   PCO2ART 67 ([place]) 04/22/2021 0949   PO2ART 90 04/22/2021 0949   HCO3 37.9 (H) 04/22/2021 0949   O2SAT 96.6 04/22/2021 0949       Assessment & Plan:  19 morbidly obese white female with Acute on chronic hypercapnic hypoxic respiratory failure  Acute on chronic diastolic CHF exacerbation, severe AS with Acute COPD exacerbation  With  underlying OSA with Community-acquired pneumonia,Tobacco dependence H/o Malignant neoplasm of right lung   RESP: Vent Mode: PRVC FiO2 (%):  [35 %] 35 % Set Rate:  [15 bmp] 15 bmp Vt Set:  [400 mL] 400 mL PEEP:  [5 cmH20] 5 cmH20  Severe ACUTE Hypoxic and Hypercapnic Respiratory Failure -continue Mechanical Ventilator support -continue Bronchodilator Therapy -Wean Fio2 and PEEP as tolerated -VAP/VENT bundle implementation -Will NOT perform SAT/SBT when respiratory parameters are met -CT chest today for re-eval of parenchyma and data for d/w family regarding cancer  -Maintain Zosyn for moment  Lung Cancer -Squamous cell carcinoma right lower lobe (04/2020) status post SBRT with concern for recurrence -CT scan PET scan September 2022  imaging findings highly concerning for recurrent malignancy -Per Oncology last visit holding off biopsy given patient's tenuous respiratory status.   -On Keytruda IV infusion every 3 weeks   CV:  ECHO 04/16/21: IMPRESSIONS   1. Left ventricular ejection fraction, by estimation, is 50 to 55%.    No regional wall motion abnormalities.    Left ventricular diastolic parameters are consistent with Grade II diastolic dysfunction (pseudonormalization).   2. Right ventricular systolic function is low normal. The right ventricular size is not well visualized.   3. Left atrial size was moderately dilated.   4. The mitral valve is degenerative. No evidence of mitral valve regurgitation. Moderate mitral annular calcification.   5. Aortic valve mean gradient = 327mHg, peak gradient 660mg, Vmax  3.27m4m DVI= 0.23, AVA 0.7cm2. . The aortic valve is calcified. Aortic  valve regurgitation is not visualized. Severe aortic valve stenosis.   6. The inferior vena cava is normal in size with greater than 50%  respiratory variability, suggesting right atrial pressure of 3 mmHg.   Conclusion(s)/Recommendation(s): Findings consistent with severe valvular  heart disease.    Multifactorial shock in the setting of sedation, heart failure Severe aortic stenosis with preload variability in acute state and RVR, all likely came together Paroxysmal atrial fibrillation/RVR CARDorardiology is following Not candidate for TAVR as an outpatient given active malignancy increase midodrine Continue  Eliquis for stroke prophylaxis Continue diltiazem Avoid amiodarone given sinus pauses Continue atorvastatin   RENAL: ACUTE KIDNEY INJURY/Renal Failure  CKD-Stage IIIa -continue Foley Catheter-assess need -Avoid nephrotoxic agents -Follow urine output, BMP -Ensure adequate renal perfusion, optimize oxygenation -Renal dose medications   Intake/Output Summary (Last 24 hours) at 04/28/2021 1154 Last data filed at 04/28/2021 1000 Gross per 24 hour  Intake 2909.05 ml  Output 1380 ml  Net 1529.05 ml    ENDO: - ICU hypoglycemic\Hyperglycemia protocol -check FSBS per protocol  GI: GI PROPHYLAXIS as indicated  NUTRITIONAL STATUS DIET-->TF's as tolerated Constipation protocol as indicated      Best practice:  Diet:  NPO, tube feeds VAP protocol (if indicated): Yes DVT prophylaxis: Systemic AC GI prophylaxis: PPI Glucose control:  SSI Yes Foley:  Yes, and it is still needed for close monitoring intake and output Mobility:  bed rest  PT consulted: N/A Last date of multidisciplinary goals of care discussion [12/9] Code Status:  full code Disposition: ICU   DVT/GI PRX  assessed I Assessed the need for Labs I Assessed the need for Foley I Assessed the need for Central Venous Line Family Discussion when available I Assessed the need for Mobilization I made an Assessment of medications to be adjusted accordingly Safety Risk assessment completed  CASE DISCUSSED IN MULTIDISCIPLINARY ROUNDS WITH ICU TEAM  Critical care was necessary to treat /prevent imminent and life-threatening deterioration. Overall, patient is critically ill, prognosis  is guarded.  Patient with Multiorgan failure and at high risk for cardiac arrest and death.   Critical Care Time devoted to patient care services described in this note is 50 minutes.  Overall, patient is critically ill, prognosis is guarded.   Patient with Multiorgan failure and at high risk for cardiac arrest and death.  Images reviewed directly and interpretation in A&P is my own unless noted Labs reviewed and evaluated as noted in A/P   //Damieon Armendariz

## 2021-04-28 NOTE — Progress Notes (Signed)
Progress Note  Patient Name: Kristin [name] Date of Encounter: 04/28/2021  [facility] Cardiologist: Ida Rogue, MD   Subjective   Patient remains intubated and critically ill.  She opens her eyes to her daughter's voice.  Inpatient Medications    Scheduled Meds:  apixaban  5 mg Per Tube BID   arformoterol  15 mcg Nebulization BID   atorvastatin  80 mg Per Tube QHS   budesonide (PULMICORT) nebulizer solution  0.25 mg Nebulization BID   chlorhexidine gluconate (MEDLINE KIT)  15 mL Mouth Rinse BID   Chlorhexidine Gluconate Cloth  6 each Topical Q0600   diltiazem  30 mg Oral Q6H   docusate  100 mg Per Tube BID   ezetimibe  10 mg Per Tube Daily   famotidine  20 mg Per Tube BID   free water  150 mL Per Tube Q4H   gabapentin  300 mg Per Tube QHS   insulin aspart  0-15 Units Subcutaneous Q4H   levothyroxine  75 mcg Per Tube Q0600   mouth rinse  15 mL Mouth Rinse 10 times per day   midodrine  10 mg Per Tube TID WC   polyethylene glycol  17 g Per Tube Daily   revefenacin  175 mcg Nebulization Daily   Continuous Infusions:  sodium chloride 10 mL/hr at 04/28/21 1200   diltiazem (CARDIZEM) infusion Stopped (04/25/21 2236)   feeding supplement (VITAL HIGH PROTEIN) 60 mL/hr at 04/28/21 0600   fentaNYL infusion INTRAVENOUS 200 mcg/hr (04/28/21 1200)   phenylephrine (NEO-SYNEPHRINE) Adult infusion 24 mcg/min (04/28/21 1200)   piperacillin-tazobactam (ZOSYN)  IV     PRN Meds: acetaminophen, docusate, fentaNYL, ipratropium-albuterol, polyethylene glycol   Vital Signs    Vitals:   04/28/21 0621 04/28/21 0800 04/28/21 1000 04/28/21 1200  BP:      Pulse: (!) 50 (!) 52 (!) 45 (!) 48  Resp: '15 12 15 16  ' Temp:  100.2 F (37.9 C)  100.3 F (37.9 C)  TempSrc:  Axillary  Axillary  SpO2: (!) 89% 91% 91% 94%  Weight:      Height:        Intake/Output Summary (Last 24 hours) at 04/28/2021 1248 Last data filed at 04/28/2021 1200 Gross per 24 hour  Intake 2711.95 ml  Output  1210 ml  Net 1501.95 ml   Last 3 Weights 04/28/2021 04/27/2021 04/26/2021  Weight (lbs) 238 lb 8.6 oz 239 lb 6.7 oz 245 lb 2.4 oz  Weight (kg) 108.2 kg 108.6 kg 111.2 kg      Telemetry    Atrial fibrillation with conversion to junctional rhythm with 4.9-second posttermination pause.  Patiently currently in junctional rhythm with rate of 56 bpm. - Personally Reviewed  ECG    No new tracing  Physical Exam   GEN: Intubated and sedated but opens eyes to daughter's voice. Neck: Unable to assess JVP due to support devices and positioning. Cardiac: Bradycardic but regular rhythm with 2/6 systolic murmur. Respiratory: Coarse breath sounds anteriorly. GI: Soft, nontender, non-distended  MS: Trace pretibial edema Neuro: Intubated Psych: Intubated  Labs    High Sensitivity Troponin:   Recent Labs  Lab 04/16/2021 0608 05/12/2021 1207 04/23/21 1222 04/23/21 1418  TROPONINIHS 12 17 22* 27*     Chemistry Recent Labs  Lab 04/22/21 0419 04/23/21 0245 04/26/21 0416 04/27/21 0457 04/28/21 0505  NA 145   < > 146* 142 145  K 4.8   < > 4.0 3.9 4.0  CL 105   < >  99 95* 100  CO2 33*   < > 39* 40* 40*  GLUCOSE 198*   < > 139* 164* 173*  BUN 53*   < > 57* 49* 49*  CREATININE 1.87*   < > 1.52* 1.46* 1.14*  CALCIUM 8.8*   < > 8.8* 8.9 8.8*  MG 2.4   < > 1.8 2.4 2.1  PROT 6.9  --   --   --   --   ALBUMIN 3.3*  --   --   --   --   AST 15  --   --   --   --   ALT 12  --   --   --   --   ALKPHOS 33*  --   --   --   --   BILITOT 0.6  --   --   --   --   GFRNONAA 29*   < > 37* 39* 52*  ANIONGAP 7   < > '8 7 5   ' < > = values in this interval not displayed.    Lipids  Recent Labs  Lab 04/24/21 0620  TRIG 160*    Hematology Recent Labs  Lab 04/26/21 0416 04/27/21 0457 04/28/21 0505  WBC 13.6* 16.9* 14.0*  RBC 3.34* 3.46* 3.17*  HGB 9.1* 9.6* 8.7*  HCT 30.6* 31.5* 29.1*  MCV 91.6 91.0 91.8  MCH 27.2 27.7 27.4  MCHC 29.7* 30.5 29.9*  RDW 14.4 14.1 14.3  PLT 247 223 203    Thyroid No results for input(s): TSH, FREET4 in the last 168 hours.  BNPNo results for input(s): BNP, PROBNP in the last 168 hours.  DDimer No results for input(s): DDIMER in the last 168 hours.   Radiology    No results found.  Cardiac Studies   TTE (04/16/2021):  1. Left ventricular ejection fraction, by estimation, is 50 to 55%. The  left ventricle has low normal function. The left ventricle has no regional  wall motion abnormalities. Left ventricular diastolic parameters are  consistent with Grade II diastolic  dysfunction (pseudonormalization).   2. Right ventricular systolic function is low normal. The right  ventricular size is not well visualized.   3. Left atrial size was moderately dilated.   4. The mitral valve is degenerative. No evidence of mitral valve  regurgitation. Moderate mitral annular calcification.   5. Aortic valve mean gradient = 65mHg, peak gradient 622mg, Vmax  3.13m15m DVI= 0.23, AVA 0.7cm2. . The aortic valve is calcified. Aortic  valve regurgitation is not visualized. Severe aortic valve stenosis.   6. The inferior vena cava is normal in size with greater than 50%  respiratory variability, suggesting right atrial pressure of 3 mmHg.   Patient Profile     [age]o. female with history of COPD, lung cancer s/p radiation therapy, severe AAS, paroxysmal atrial fibrillation, nonobstructive CAD presenting with shortness of breath diagnosed with respiratory failure requiring intubation.  Assessment & Plan   Acute on chronic COPD and acute respiratory failure with hypoxia and hypercapnia: Patient unable to be weaned from ventilator.  Ongoing management per CCM.  Shock: Likely multifactorial with some degree of cardiogenic shock in the setting of severe aortic stenosis, though I do not believe that this alone explains her hypotension.  She is actually hypertensive at this time in the setting of vasopressor use.  Recommend weaning of vasopressors as  tolerated, as increased afterload can exacerbate the hemodynamic effects of severe aortic stenosis.  Paroxysmal atrial fibrillation with posttermination  pauses and junctional rhythm: This has been an ongoing issue during the hospitalization, likely exacerbated by the patient's acute illness with multiorgan failure.  She is not a candidate for pacemaker placement.  Avoid AV nodal blocking agents if possible.  Severe aortic stenosis: Patient followed in the valve clinic and felt to be suboptimal candidate for TAVR.  Given her poor progression during this hospitalization, I do not think she would be able to tolerate any valve intervention in the future.  Lung cancer: Followed as an outpatient.  Patient has a poor prognosis.  There is no role for further cardiac intervention at this time.  Transitioning to comfort care would be appropriate.  We will sign off at this time.  Contact the on-call provider if further cardiac questions or concerns arise.  For questions or updates, please contact [facility] Please consult [email or web address] for contact info under [facility].     Signed, Nelva Bush, MD  04/28/2021, 12:48 PM

## 2021-04-28 NOTE — Plan of Care (Signed)

## 2021-04-29 LAB — BASIC METABOLIC PANEL
Anion gap: 8 (ref 5–15)
BUN: 48 mg/dL — ABNORMAL HIGH (ref 8–23)
CO2: 38 mmol/L — ABNORMAL HIGH (ref 22–32)
Calcium: 8.8 mg/dL — ABNORMAL LOW (ref 8.9–10.3)
Chloride: 99 mmol/L (ref 98–111)
Creatinine, Ser: 1.23 mg/dL — ABNORMAL HIGH (ref 0.44–1.00)
GFR, Estimated: 48 mL/min — ABNORMAL LOW (ref >60)
Glucose, Bld: 183 mg/dL — ABNORMAL HIGH (ref 70–99)
Potassium: 4.1 mmol/L (ref 3.5–5.1)
Sodium: 145 mmol/L (ref 135–145)

## 2021-04-29 LAB — CBC WITH DIFFERENTIAL/PLATELET
Abs Immature Granulocytes: 0.08 10*3/uL — ABNORMAL HIGH (ref 0.00–0.07)
Basophils Absolute: 0.1 10*3/uL (ref 0.0–0.1)
Basophils Relative: 0 %
Eosinophils Absolute: 0.9 10*3/uL — ABNORMAL HIGH (ref 0.0–0.5)
Eosinophils Relative: 6 %
HCT: 29.2 % — ABNORMAL LOW (ref 36.0–46.0)
Hemoglobin: 8.8 g/dL — ABNORMAL LOW (ref 12.0–15.0)
Immature Granulocytes: 1 %
Lymphocytes Relative: 7 %
Lymphs Abs: 1.1 10*3/uL (ref 0.7–4.0)
MCH: 27.5 pg (ref 26.0–34.0)
MCHC: 30.1 g/dL (ref 30.0–36.0)
MCV: 91.3 fL (ref 80.0–100.0)
Monocytes Absolute: 1.4 10*3/uL — ABNORMAL HIGH (ref 0.1–1.0)
Monocytes Relative: 9 %
Neutro Abs: 12 10*3/uL — ABNORMAL HIGH (ref 1.7–7.7)
Neutrophils Relative %: 77 %
Platelets: 236 10*3/uL (ref 150–400)
RBC: 3.2 MIL/uL — ABNORMAL LOW (ref 3.87–5.11)
RDW: 14.2 % (ref 11.5–15.5)
WBC: 15.5 10*3/uL — ABNORMAL HIGH (ref 4.0–10.5)
nRBC: 0 % (ref 0.0–0.2)

## 2021-04-29 LAB — TRIGLYCERIDES: Triglycerides: 174 mg/dL — ABNORMAL HIGH (ref <150)

## 2021-04-29 LAB — GLUCOSE, CAPILLARY
Glucose-Capillary: 147 mg/dL — ABNORMAL HIGH (ref 70–99)
Glucose-Capillary: 180 mg/dL — ABNORMAL HIGH (ref 70–99)
Glucose-Capillary: 173 mg/dL — ABNORMAL HIGH (ref 70–99)
Glucose-Capillary: 180 mg/dL — ABNORMAL HIGH (ref 70–99)
Glucose-Capillary: 203 mg/dL — ABNORMAL HIGH (ref 70–99)

## 2021-04-29 LAB — PHOSPHORUS: Phosphorus: 3.4 mg/dL (ref 2.5–4.6)

## 2021-04-29 LAB — MAGNESIUM: Magnesium: 2.4 mg/dL (ref 1.7–2.4)

## 2021-04-29 MED ORDER — HALOPERIDOL 0.5 MG PO TABS
0.5000 mg | ORAL_TABLET | ORAL | Status: DC | PRN
  Filled 2021-04-29: qty 1

## 2021-04-29 MED ORDER — GLYCOPYRROLATE 1 MG PO TABS
1.0000 mg | ORAL_TABLET | ORAL | Status: DC | PRN
  Filled 2021-04-29: qty 1

## 2021-04-29 MED ORDER — ACETAMINOPHEN 650 MG RE SUPP: 650.0000 mg | Freq: Four times a day (QID) | RECTAL | Status: DC | PRN

## 2021-04-29 MED ORDER — HALOPERIDOL LACTATE 5 MG/ML IJ SOLN
0.5000 mg | INTRAMUSCULAR | Status: DC | PRN
  Administered 2021-04-29: 15:00:00 0.5 mg via INTRAVENOUS
  Filled 2021-04-29: qty 1

## 2021-04-29 MED ORDER — DIPHENHYDRAMINE HCL 50 MG/ML IJ SOLN: 25.0000 mg | INTRAMUSCULAR | Status: DC | PRN

## 2021-04-29 MED ORDER — GLYCOPYRROLATE 0.2 MG/ML IJ SOLN
0.2000 mg | INTRAMUSCULAR | Status: DC | PRN
  Administered 2021-04-29 – 2021-04-30 (×5): 0.2 mg via INTRAVENOUS
  Filled 2021-04-29 (×5): qty 1

## 2021-04-29 MED ORDER — MIDAZOLAM HCL 2 MG/2ML IJ SOLN
2.0000 mg | INTRAMUSCULAR | Status: DC | PRN
  Administered 2021-04-29: 19:00:00 2 mg via INTRAVENOUS

## 2021-04-29 MED ORDER — ONDANSETRON 4 MG PO TBDP
4.0000 mg | ORAL_TABLET | Freq: Four times a day (QID) | ORAL | Status: DC | PRN
  Filled 2021-04-29: qty 1

## 2021-04-29 MED ORDER — SODIUM CHLORIDE 0.9 % IV SOLN
2.0000 mg/h | INTRAVENOUS | Status: DC
  Filled 2021-04-29: qty 5

## 2021-04-29 MED ORDER — POLYVINYL ALCOHOL 1.4 % OP SOLN
1.0000 [drp] | Freq: Four times a day (QID) | OPHTHALMIC | Status: DC | PRN
  Filled 2021-04-29: qty 15

## 2021-04-29 MED ORDER — GLYCOPYRROLATE 0.2 MG/ML IJ SOLN: 0.2000 mg | INTRAMUSCULAR | Status: DC | PRN

## 2021-04-29 MED ORDER — DEXTROSE 5 % IV SOLN: INTRAVENOUS | Status: DC

## 2021-04-29 MED ORDER — POLYVINYL ALCOHOL 1.4 % OP SOLN: 1.0000 [drp] | Freq: Four times a day (QID) | OPHTHALMIC | Status: DC | PRN

## 2021-04-29 MED ORDER — HYDROMORPHONE HCL 1 MG/ML IJ SOLN
1.0000 mg | INTRAMUSCULAR | Status: DC | PRN
  Administered 2021-04-29 (×3): 1 mg via INTRAVENOUS
  Filled 2021-04-29 (×2): qty 1

## 2021-04-29 MED ORDER — HYDROMORPHONE HCL 1 MG/ML IJ SOLN
1.0000 mg | INTRAMUSCULAR | Status: DC
  Administered 2021-04-29 – 2021-04-30 (×19): 1 mg via INTRAVENOUS
  Filled 2021-04-29 (×8): qty 1

## 2021-04-29 MED ORDER — ONDANSETRON HCL 4 MG/2ML IJ SOLN: 4.0000 mg | Freq: Four times a day (QID) | INTRAMUSCULAR | Status: DC | PRN

## 2021-04-29 MED ORDER — MIDAZOLAM HCL 2 MG/2ML IJ SOLN
4.0000 mg | Freq: Once | INTRAMUSCULAR | Status: AC
  Administered 2021-04-29: 4 mg via INTRAVENOUS

## 2021-04-29 MED ORDER — HALOPERIDOL LACTATE 2 MG/ML PO CONC
0.5000 mg | ORAL | Status: DC | PRN
  Filled 2021-04-29: qty 0.3

## 2021-04-29 MED ORDER — MIDAZOLAM HCL 2 MG/2ML IJ SOLN
2.0000 mg | INTRAMUSCULAR | Status: DC | PRN
  Administered 2021-04-29 (×3): 2 mg via INTRAVENOUS
  Filled 2021-04-29: qty 4
  Filled 2021-04-29 (×3): qty 2

## 2021-04-29 MED ORDER — MIDAZOLAM-SODIUM CHLORIDE 100-0.9 MG/100ML-% IV SOLN
4.0000 mg/h | INTRAVENOUS | Status: DC
Start: 2021-04-29 — End: 2021-04-30
  Administered 2021-04-29: 4 mg/h via INTRAVENOUS
  Filled 2021-04-29: qty 100

## 2021-04-29 MED ORDER — ACETAMINOPHEN 325 MG PO TABS: 650.0000 mg | ORAL_TABLET | Freq: Four times a day (QID) | ORAL | Status: DC | PRN

## 2021-04-29 MED ORDER — GLYCOPYRROLATE 0.2 MG/ML IJ SOLN
0.2000 mg | INTRAMUSCULAR | Status: DC | PRN
  Administered 2021-04-29: 13:00:00 0.2 mg via INTRAVENOUS
  Filled 2021-04-29: qty 1

## 2021-04-29 NOTE — Progress Notes (Signed)
Palliative: Kristin [name] is lying quietly in bed she is intubated/ventilated and sedated.  Her daughter Kristin [name], son, brother and sister are at bedside.  Nursing staff has shared that family is waiting for daughter Kristin [name] to arrive for family conference.  After the conference they plan for compassionate extubation.  Daughter Kristin [name] endorses this to be correct.  I return later in the afternoon to find that Kristin [name] has been extubated.  Orders reviewed and adjusted.  Conference with bedside nursing staff and attending.  Plan: Compassionate extubation, let nature take its course.  Comfort care. Prognosis: Anticipate hours, in hospital death.  31 minutes [name], NP Palliative medicine team Team phone [phone] Greater than 50% of this time was spent counseling and coordinating care related to the above assessment and plan.

## 2021-04-29 NOTE — Progress Notes (Signed)
Pt extubated to comfort this shift. Dilaudid gtt with PRN doses for comfort. Versed gtt for gasping breaths despite Dilaudid and other PRNs. Robinul for secretions. Comfort food tray ordered for family.

## 2021-04-29 NOTE — Consult Note (Signed)
PHARMACY CONSULT NOTE  Pharmacy Consult for Electrolyte Monitoring and Replacement   Recent Labs: Potassium (mmol/L)  Date Value  04/29/2021 4.1   Magnesium (mg/dL)  Date Value  04/29/2021 2.4   Calcium (mg/dL)  Date Value  04/29/2021 8.8 (L)   Albumin (g/dL)  Date Value  04/22/2021 3.3 (L)   Phosphorus (mg/dL)  Date Value  04/29/2021 3.4   Sodium (mmol/L)  Date Value  04/29/2021 145   Assessment: Patient is a 70 y/o F with medical history including HFpEF, AS, Afib, HTN, chronic respiratory failure, tobacco abuse, COPD, OSA, hypothyroidism, diabetes, anxiety, GERD, lung cancer who is admitted with acute on chronic respiratory failure requiring intubation and mechanical ventilation. Pharmacy consulted to assist with electrolyte replacement as indicated.   Patient extubated 12/5. Failed BiPAP. Ultimately re-intubated 12/6  Labs Na 142>145>145 K 3.9>4.0>4.1 Phos 4.4>3.3>3.4 Mg 2.4>2.1>2.4  Goal of Therapy:  Electrolytes within normal limits  Plan:  --No repletion is indicated at this time --Will continue to monitor electrolytes and will f/u with AM labs  Narda Rutherford, PharmD Pharmacy Resident  04/29/2021 7:43 AM

## 2021-04-29 NOTE — Progress Notes (Signed)
NAME:  Kristin [name], MRN:  [id number], DOB:  04/26/1951, LOS: 53 ADMISSION DATE:  04/26/2021, CONSULTATION DATE:  04/17/2021 REFERRING MD: Hinda Kehr, MD CHIEF COMPLAINT: SOB    HPI  70 y.o. 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  Per ED notes and EMS run sheet, patient was found with an oxygen saturation somewhere between the 50s and 70s, obviously tired but responsive. Pt placed on CPAP en route to the ER by EMS and treated with Duonebs and IV solumedrol. However in spite of starting her on CPAP and giving her Solu-Medrol and 1 DuoNeb breathing treatment, she became less responsive during transit.  Patient was intubated in the emergency department, placed on mechanical ventilation, PCCM was consulted for evaluation and help with management   Past Medical History    Acute respiratory failure ([place]) 05/16/2021   Primary cancer of right lower lobe of lung ([place]) 02/20/2021   COPD GOLD ? 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) 05/26/2020   Atrial flutter with rapid ventricular response ([place]) 05/26/2020   Demand ischemia ([place])     Acute on chronic heart failure with preserved ejection fraction (HFpEF) ([place])     Aortic stenosis-moderate to severe 03/2020     Obesity, Class III, BMI 40-49.9 (morbid obesity) ([place]) 03/29/2020   Chronic respiratory failure with hypoxia ([place]) 03/29/2020   OSA (obstructive sleep apnea) 03/29/2020   Essential hypertension 03/29/2020   Hypothyroidism 03/29/2020   Chest pain 03/29/2020   Elevated troponin 03/29/2020   Chronic diastolic (congestive) heart failure ([place]) 07/12/2018   Type 2 diabetes mellitus without complication ([place]) 21/30/8657   COPD exacerbation ([place]) 07/12/2018   Tobacco use 07/12/2018   Hypotension 07/12/2018   Chronic respiratory failure with hypoxia and hypercapnia ([place]) 07/04/2018   Morbid obesity due to excess calories ([place]) 02/08/2016    Significant Hospital Events   12/4: Admitted to the ICU with acute on chronic hypoxic hypercapnic respiratory failure. 12/5: Patient was extubated but she failed due to increased work of breathing, hypoxia and CO2 retention 12/8: Multiple bouts of Aflutter requiring initiation of amiodarone; transitioned from Levophed to Neosynephrine; CVC and arterial line placed 12/11 severe resp failure 12/12 remains on vent, severe AS 12/13 relatively static course overnight  Consults:  PCCM  Procedures:  12/4> Intubation >> 12/5 extubation 12/5 reintubation 12/8 difficult to control Afib with RVR overnight 12/10 recurrent Afib w/ RVR after coughing; required digoxin and dilt gtt  Subjective/interval history  Remains on vent Remains on pressors Cardiogenic shock Multiorgan failure and failure to wean  from vent  OBJECTIVE  Blood pressure (!) 123/23, pulse (!) 102, temperature (!) 100.6 F (38.1 C), temperature source Axillary, resp. rate 15,  height 5' 2.01" (1.575 m), weight 108.2 kg, SpO2 94 %.    Vent Mode: PRVC FiO2 (%):  [35 %] 35 % Set Rate:  [15 bmp] 15 bmp Vt Set:  [400 mL-450 mL] 450 mL PEEP:  [5 cmH20-10 cmH20] 10 cmH20   Intake/Output Summary (Last 24 hours) at 04/29/2021 1406 Last data filed at 04/29/2021 7741 Gross per 24 hour  Intake 1928.94 ml  Output 1460 ml  Net 468.94 ml    Filed Weights   04/26/21 0500 04/27/21 0500 04/28/21 0452  Weight: 111.2 kg 108.6 kg 108.2 kg     REVIEW OF SYSTEMS  PATIENT IS UNABLE TO PROVIDE COMPLETE REVIEW OF SYSTEMS DUE TO SEVERE CRITICAL ILLNESS AND TOXIC METABOLIC ENCEPHALOPATHY  Physical Exam Vitals reviewed.  Constitutional:      General: She is not in acute distress.    Appearance: She is obese. She is ill-appearing. She is not toxic-appearing.     Interventions: She is intubated.  HENT:     Head: Normocephalic.     Right Ear: External ear normal.     Left Ear: External ear normal.     Nose: Nose normal.     Mouth/Throat:     Mouth: Mucous membranes are moist.  Eyes:     Extraocular Movements: Extraocular movements intact.     Pupils: Pupils are equal, round, and reactive to light.  Cardiovascular:     Rate and Rhythm: Bradycardia present. Occasional Extrasystoles are present.    Pulses: No decreased pulses.     Heart sounds: Murmur heard.  Systolic murmur is present with a grade of 2/6.  Pulmonary:     Effort: Prolonged expiration present. No respiratory distress. She is intubated.     Breath sounds: Examination of the right-lower field reveals decreased breath sounds. Examination of the left-lower field reveals decreased breath sounds. Decreased breath sounds and rhonchi present. No wheezing or rales.  Abdominal:     General: Abdomen is protuberant. Bowel sounds are normal. There is no distension.     Palpations: Abdomen is soft.     Tenderness: There is no abdominal tenderness. There is no guarding or rebound.  Musculoskeletal:     Right lower  leg: Edema present.     Left lower leg: Edema present.  Neurological:     Mental Status: She is lethargic.     GCS: GCS eye subscore is 2. GCS motor subscore is 4.     Comments: Sedated for ETT/MV     Labs   CBC: Recent Labs  Lab 04/25/21 0452 04/26/21 0416 04/27/21 0457 04/28/21 0505 04/29/21 0507  WBC 13.3* 13.6* 16.9* 14.0* 15.5*  NEUTROABS  --   --   --   --  12.0*  HGB 9.2* 9.1* 9.6* 8.7* 8.8*  HCT 30.8* 30.6* 31.5* 29.1* 29.2*  MCV 93.1 91.6 91.0 91.8 91.3  PLT 248 247 223 203 236     Basic Metabolic Panel: Recent Labs  Lab 04/25/21 0452 04/26/21 0416 04/27/21 0457 04/28/21 0505 04/29/21 0507  NA 149* 146* 142 145 145  K 4.7 4.0 3.9 4.0 4.1  CL 104 99 95* 100 99  CO2 39* 39* 40* 40* 38*  GLUCOSE 129* 139* 164* 173* 183*  BUN 64* 57* 49* 49* 48*  CREATININE 1.63* 1.52* 1.46* 1.14* 1.23*  CALCIUM 9.3 8.8* 8.9 8.8* 8.8*  MG  2.3 1.8 2.4 2.1 2.4  PHOS 5.4* 4.6 4.4 3.3 3.4    GFR: Estimated Creatinine Clearance: 50 mL/min (A) (by C-G formula based on SCr of 1.23 mg/dL (H)). Recent Labs  Lab 04/25/21 0911 04/26/21 0416 04/27/21 0457 04/28/21 0505 04/29/21 0507  WBC  --  13.6* 16.9* 14.0* 15.5*  LATICACIDVEN 0.7  --   --   --   --      Liver Function Tests: No results for input(s): AST, ALT, ALKPHOS, BILITOT, PROT, ALBUMIN in the last 168 hours. ABG    Component Value Date/Time   PHART 7.36 04/22/2021 0949   PCO2ART 67 (HH) 04/22/2021 0949   PO2ART 90 04/22/2021 0949   HCO3 37.9 (H) 04/22/2021 0949   O2SAT 96.6 04/22/2021 0949       Assessment & Plan:  14 morbidly obese white female with Acute on chronic hypercapnic hypoxic respiratory failure  Acute on chronic diastolic CHF exacerbation, severe AS with Acute COPD exacerbation  With underlying OSA with Community-acquired pneumonia,Tobacco dependence H/o Malignant neoplasm of right lung   RESP: Vent Mode: PRVC FiO2 (%):  [35 %] 35 % Set Rate:  [15 bmp] 15 bmp Vt Set:  [400 mL-450 mL]  450 mL PEEP:  [5 cmH20-10 cmH20] 10 cmH20  Severe ACUTE Hypoxic and Hypercapnic Respiratory Failure -continue Mechanical Ventilator support -continue Bronchodilator Therapy -Wean Fio2 and PEEP as tolerated -VAP/VENT bundle implementation -Will NOT perform SAT/SBT when respiratory parameters are met -CT chest today for re-eval of parenchyma and data for d/w family regarding cancer  -Maintain Zosyn  Lung Cancer -Squamous cell carcinoma right lower lobe (04/2020) status post SBRT with concern for recurrence -CT scan PET scan September 2022  imaging findings highly concerning for recurrent malignancy -Per Oncology last visit holding off biopsy given patient's tenuous respiratory status.   -On Keytruda IV infusion every 3 weeks   CV:  ECHO 04/16/21: IMPRESSIONS   1. Left ventricular ejection fraction, by estimation, is 50 to 55%.    No regional wall motion abnormalities.    Left ventricular diastolic parameters are consistent with Grade II diastolic dysfunction (pseudonormalization).   2. Right ventricular systolic function is low normal. The right ventricular size is not well visualized.   3. Left atrial size was moderately dilated.   4. The mitral valve is degenerative. No evidence of mitral valve regurgitation. Moderate mitral annular calcification.   5. Aortic valve mean gradient = 64mHg, peak gradient 670mg, Vmax  3.60m44m DVI= 0.23, AVA 0.7cm2. . The aortic valve is calcified. Aortic  valve regurgitation is not visualized. Severe aortic valve stenosis.   6. The inferior vena cava is normal in size with greater than 50%  respiratory variability, suggesting right atrial pressure of 3 mmHg.   Conclusion(s)/Recommendation(s): Findings consistent with severe valvular  heart disease.   Multifactorial shock in the setting of sedation, heart failure Severe aortic stenosis with preload variability in acute state and RVR, all likely came together Paroxysmal atrial  fibrillation/RVR [facility] Not candidate for TAVR as an outpatient given active malignancy increase midodrine Continue Eliquis for stroke prophylaxis Continue diltiazem Avoid amiodarone given sinus pauses Continue atorvastatin   RENAL: Lab Results  Component Value Date   CREATININE 1.23 (H) 04/29/2021   BUN 48 (H) 04/29/2021   NA 145 04/29/2021   K 4.1 04/29/2021   CL 99 04/29/2021   CO2 38 (H) 04/29/2021    ACUTE KIDNEY INJURY/Renal Failure  CKD-Stage IIIa -continue Foley Catheter-assess need -Avoid nephrotoxic agents -Follow  urine output, BMP -Ensure adequate renal perfusion, optimize oxygenation -Renal dose medications   Intake/Output Summary (Last 24 hours) at 04/29/2021 1406 Last data filed at 04/29/2021 7482 Gross per 24 hour  Intake 1928.94 ml  Output 1460 ml  Net 468.94 ml    ENDO: - ICU hypoglycemic\Hyperglycemia protocol -check FSBS per protocol  GI: GI PROPHYLAXIS as indicated  NUTRITIONAL STATUS DIET-->TF's as tolerated Constipation protocol as indicated      Best practice:  Diet:  NPO, tube feeds VAP protocol (if indicated): Yes DVT prophylaxis: Systemic AC GI prophylaxis: PPI Glucose control:  SSI Yes Foley:  Yes, and it is still needed for close monitoring intake and output Mobility:  bed rest  PT consulted: N/A Last date of multidisciplinary goals of care discussion [12/9] Code Status:  full code Disposition: ICU   DVT/GI PRX  assessed I Assessed the need for Labs I Assessed the need for Foley I Assessed the need for Central Venous Line Family Discussion when available I Assessed the need for Mobilization I made an Assessment of medications to be adjusted accordingly Safety Risk assessment completed  CASE DISCUSSED IN MULTIDISCIPLINARY ROUNDS WITH ICU TEAM  Critical care was necessary to treat /prevent imminent and life-threatening deterioration. Overall, patient is critically ill, prognosis  is guarded.  Patient with Multiorgan failure and at high risk for cardiac arrest and death.   Critical Care Time devoted to patient care services described in this note is 40 minutes.  Overall, patient is critically ill, prognosis is guarded.   Patient with Multiorgan failure and at high risk for cardiac arrest and death.  Images reviewed directly and interpretation in A&P is my own unless noted Labs reviewed and evaluated as noted in A/P   //Meckenzie Balsley

## 2021-04-30 ENCOUNTER — Encounter: Payer: Self-pay | Admitting: *Deleted

## 2021-05-05 ENCOUNTER — Telehealth: Payer: Self-pay | Admitting: *Deleted

## 2021-05-05 NOTE — Telephone Encounter (Signed)
Patient daughter called to report that her mother passed away 2021-05-10 @3 :28 AM in the hospital

## 2021-05-14 ENCOUNTER — Ambulatory Visit: Payer: Medicare Other | Admitting: Family

## 2021-05-17 NOTE — Progress Notes (Signed)
Pt with no spontanious resp. Heart soundsand palpable pulse. Pt Prounced death at 42.Family at bed side.

## 2021-05-17 NOTE — Death Summary Note (Signed)
DEATH SUMMARY   Patient Details  Name: Kristin [name] MRN: [id number] DOB: Oct 23, 1950  Admission/Discharge Information   Admit Date:  05/13/2021  Date of Death: Date of Death: 05/24/2021  Time of Death: Time of Death: 0328  Length of Stay: 2022-08-19  Referring Physician: [facility]   Reason(s) for Hospitalization  Acute on chronic diastolic CHF exacerbation, severe AS with Acute COPD exacerbation  With underlying OSA with Community-acquired pneumonia,Tobacco dependence H/o Malignant neoplasm of right lung   Diagnoses  Preliminary cause of death:  Secondary Diagnoses (including complications and co-morbidities):  Principal Problem:   Acute respiratory failure ([place]) Active Problems:   Paroxysmal atrial fibrillation ([facility])   Severe aortic stenosis   [facility]   Brief Hospital Course (including significant findings, care, treatment, and services provided and events leading to death)  Kristin [name] is a 71 y.o. 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   Per ED notes and EMS run sheet, patient was found with an oxygen saturation somewhere between the 50s and 70s, obviously tired but responsive. Pt placed on CPAP en route to the ER by EMS and treated with Duonebs and IV solumedrol. However in spite of starting her on CPAP and giving her Solu-Medrol and 1 DuoNeb breathing treatment, she became less responsive during transit.  Patient was intubated in the emergency department, placed on mechanical ventilation, PCCM was consulted for evaluation and help with management. On 05/14/2023: Admitted to the ICU with acute on  chronic hypoxic hypercapnic respiratory failure. 12/5: Patient was extubated but she failed due to increased work of breathing, hypoxia and CO2 retention 12/8: Multiple bouts of Aflutter requiring initiation of amiodarone; transitioned from Levophed to Neosynephrine; CVC and arterial line was placed for better blood pressure monitoring. Patient was started on Precedex however she continued to have burst of SVTS and long pauses so Precedex was stopped and patient started on propofol.Patient continued to have difficult to control afib, long pauses and excessive bradycardia limiting use of antiarrythmic per cardiology. She remained intubated with continued supportive care for paroxysmal atrial fibrillation with posttermination pauses.  She remained anticoagulated with Eliquis and oral diltiazem. Patient had known history of severe aortic stenosis,however was deemed not a candidate for TAVR given her respiratory status and underlying lung cancer per cardiology further complicating her prognosis. Palliative consulted to discuss goals of care and after long discussion with patient's family, family agreed to compassionate extubation and transition to full comfort care measures only. Patient was extubated on 04/29/21 at 6:30 pm per family wishes and transitioned to comfort care. She passed away peacefully on 24-May-2021 at 0328 with family at the bedside.  Pertinent Labs and Studies  Significant Diagnostic Studies DG Chest 1 View  Result Date: 04/21/2021 CLINICAL DATA:  Respiratory failure EXAM: CHEST  1 VIEW COMPARISON:  04/23/2021 FINDINGS: Endotracheal tube seen 3.9 cm above the carina. Nasogastric tube extends into the upper abdomen beyond the margin of the examination. The lungs are symmetrically well expanded. Cardiac size is mildly enlarged. Central pulmonary vascular enlargement and trace interstitial pulmonary edema persists, in keeping with mild cardiogenic failure. No pneumothorax or pleural effusion.  Cardiac size within normal limits. IMPRESSION: Support tubes in  appropriate position. Mild cardiogenic failure.  Stable cardiomegaly. Preserved pulmonary insufflation. Electronically Signed   By: Fidela Salisbury M.D.   On: 04/21/2021 02:16   DG Chest 1 View  Result Date: 04/21/2021 CLINICAL DATA:  Respiratory failure EXAM: CHEST  1 VIEW COMPARISON:  05/13/2021 FINDINGS: Interval extubation. Pulmonary insufflation remain stable of the lungs are symmetrically well expanded. Superimposed interstitial thickening is unchanged, likely chronic in nature. No superimposed focal pulmonary infiltrate. No pneumothorax or pleural effusion. Cardiac size is within normal limits. Remote right proximal humeral fracture again noted. IMPRESSION: Interval extubation with preservation of pulmonary insufflation. Electronically Signed   By: Fidela Salisbury M.D.   On: 04/21/2021 01:33   DG Chest 1 View  Result Date: 04/23/2021 CLINICAL DATA:  Status post intubation. EXAM: CHEST  1 VIEW COMPARISON:  01/18/2021 FINDINGS: 0615 hours. Endotracheal tube tip is 4.1 cm above the base of the carina. The NG tube passes into the stomach although the distal tip position is not included on the film. The cardio pericardial silhouette is enlarged. Diffuse interstitial opacity suggest pulmonary edema. The visualized bony structures of the thorax show no acute abnormality. Telemetry leads overlie the chest. IMPRESSION: No substantial interval change. Cardiomegaly with interstitial pulmonary edema pattern. Electronically Signed   By: Misty Stanley M.D.   On: 05/14/2021 06:31   DG Abd 1 View  Result Date: 04/25/2021 CLINICAL DATA:  Enteric catheter placement EXAM: ABDOMEN - 1 VIEW COMPARISON:  04/25/2021 FINDINGS: Frontal view of the lower chest and upper abdomen demonstrates enteric catheter passing below diaphragm, tip and side port project over the gastric fundus. External defibrillator pads overlie the left chest. Bowel gas pattern is  unremarkable. IMPRESSION: 1. Enteric catheter tip projecting over the gastric fundus. Electronically Signed   By: Randa Ngo M.D.   On: 04/25/2021 19:41   DG Abd 1 View  Result Date: 04/25/2021 CLINICAL DATA:  OG tube placement EXAM: ABDOMEN - 1 VIEW COMPARISON:  04/21/2021 FINDINGS: OG tube coils in the stomach with the tip in the mid to proximal stomach. Nonobstructive bowel gas pattern. IMPRESSION: OG tube in the stomach as above. Electronically Signed   By: Rolm Baptise M.D.   On: 04/25/2021 16:22   DG Abd 1 View  Result Date: 04/21/2021 CLINICAL DATA:  OG tube EXAM: ABDOMEN - 1 VIEW COMPARISON:  04/21/2021 FINDINGS: Interval advancement of esophageal tube, the tip overlies the 2/3 portion of duodenum. Mild air distension of central bowel. IMPRESSION: Esophageal tube tip overlies the duodenum Electronically Signed   By: Donavan Foil M.D.   On: 04/21/2021 16:21   DG Abd 1 View  Result Date: 04/21/2021 CLINICAL DATA:  OG placement. EXAM: ABDOMEN - 1 VIEW COMPARISON:  None. FINDINGS: Orogastric tube courses below the diaphragm with the tip in the proximal stomach. The side port is located just above the gastroesophageal junction. Lung bases further characterized on same day chest radiographs. Nonobstructive bowel gas pattern on limited visualization/assessment of the abdomen. Degenerative changes of the spine. IMPRESSION: Orogastric tube courses below the diaphragm with the tip in the proximal stomach. The side port is located just above the gastroesophageal junction. If intragastric location is desired, recommend advancement. Electronically Signed   By: Margaretha Sheffield M.D.   On: 04/21/2021 13:32   DG Abd 1 View  Result Date: 04/21/2021 CLINICAL DATA:  Orogastric tube placement EXAM: ABDOMEN - 1 VIEW COMPARISON:  05/01/2021 FINDINGS: The orogastric tube is been withdrawn and its proximal side hole is now seen at the gastroesophageal  junction. Visualized abdominal gas pattern is  nonobstructive. IMPRESSION: Withdrawal of the nasogastric tube with its tip now just beyond the gastroesophageal junction. Advancement of the catheter by 10-15 cm would more optimally position the catheter. Electronically Signed   By: Fidela Salisbury M.D.   On: 04/21/2021 02:14   DG Abd 1 View  Result Date: 04/28/2021 CLINICAL DATA:  Check gastric catheter placement EXAM: ABDOMEN - 1 VIEW COMPARISON:  Film from earlier in the same day. FINDINGS: Gastric catheter has been withdrawn although the tip appears to extend into the proximal duodenum. No other focal abnormality is noted. IMPRESSION: Slight withdrawal of the gastric catheter although the tip projects in the expected region of the proximal duodenum. Electronically Signed   By: Inez Catalina M.D.   On: 05/16/2021 23:11   DG Abd 1 View  Result Date: 05/14/2021 CLINICAL DATA:  Adjustment of orogastric tube EXAM: ABDOMEN - 1 VIEW COMPARISON:  None. FINDINGS: Orogastric tube side port projects in the right upper quadrant, likely within the duodenum. IMPRESSION: Orogastric tube side port in the duodenum. Electronically Signed   By: Ulyses Jarred M.D.   On: 04/23/2021 21:01   DG Abdomen 1 View  Result Date: 04/26/2021 CLINICAL DATA:  71 year old female OG tube placement. EXAM: ABDOMEN - 1 VIEW COMPARISON:  09/27/2020. FINDINGS: Portable AP view at 0639 hours. Enteric tube courses in the midline to the diaphragm. Tip and side hole projects just below the diaphragm, but are likely at or near the GEJ. Resolved gaseous distension of the stomach seen in May. Negative visible bowel gas pattern, lung bases. No acute osseous abnormality identified. IMPRESSION: Enteric tube tip and side hole at or near the GEJ. Recommend advancing 5 more centimeters to ensure side hole placement within the stomach. Electronically Signed   By: Genevie Ann M.D.   On: 04/20/2021 07:12   CT CHEST W CONTRAST  Result Date: 04/28/2021 CLINICAL DATA:  Pneumonia, chronic respiratory  failure, respiratory distress, history of non-small cell lung cancer EXAM: CT CHEST WITH CONTRAST TECHNIQUE: Multidetector CT imaging of the chest was performed during intravenous contrast administration. CONTRAST:  30mL OMNIPAQUE IOHEXOL 300 MG/ML  SOLN COMPARISON:  04/25/2021, 02/03/2021, 01/14/2021 FINDINGS: Cardiovascular: There is mild cardiomegaly without pericardial effusion. Prominent calcifications are seen of the mitral and aortic valves. There is diffuse atherosclerosis of the coronary vasculature. No evidence of thoracic aortic aneurysm or dissection. Prominent atherosclerosis of the aortic arch. While not optimized for evaluation of the pulmonary vasculature, no filling defects are seen within the central or segmental pulmonary arteries. Mediastinum/Nodes: Mediastinal and bilateral hilar lymphadenopathy identified, largest lymph node in the precarinal station measuring 19 mm in short axis. This is not appreciably changed since previous exam. Stable appearance of the thyroid, trachea, and esophagus. Endotracheal tube identified well above carina. Enteric catheter extends into the gastric lumen. Lungs/Pleura: Upper lobe predominant emphysema again noted. Minimal ground-glass opacities within the upper lobe could be inflammatory or infectious. Stable nodularity within the superior segment of the right lower lobe, with dominant nodule measuring 16 x 11 mm reference image 58/3, unchanged since prior exam. Scattered areas of dependent consolidation within the lower lobes, right greater than left, favor atelectasis. Trace right pleural effusion. No pneumothorax. Upper Abdomen: No acute abnormality. Musculoskeletal: No acute or destructive bony lesions. Reconstructed images demonstrate no additional findings. IMPRESSION: 1. Stable mediastinal and hilar adenopathy, consistent with known metastatic disease based on prior PET scan 02/03/2021. 2. Stable nodularity within the superior segment right lower lobe. These  areas did not demonstrate metabolic activity on recent PET scan, and could reflect sequela of treated disease. 3. Scattered ground-glass opacities within the upper lobes, favor inflammatory or infectious etiology. 4. Dependent lower lobe consolidation favor hypoventilatory change. 5. Trace right pleural effusion. 6. Support devices as above. 7. Aortic Atherosclerosis (ICD10-I70.0). Coronary artery atherosclerosis. Electronically Signed   By: Randa Ngo M.D.   On: 04/28/2021 16:36   DG Chest Port 1 View  Result Date: 04/25/2021 CLINICAL DATA:  Dyspnea EXAM: PORTABLE CHEST 1 VIEW COMPARISON:  04/23/2021 FINDINGS: ET tube terminates 3.5 cm above the carina. Enteric tube terminates just below the level of the GE junction with side port in the distal esophagus. Stable positioning of left IJ central venous catheter. Stable cardiomegaly. Aortic atherosclerosis. Persistent interstitial prominence without new focal consolidation. No pleural effusion or pneumothorax. IMPRESSION: 1. Enteric tube terminates just below the level of the GE junction with side port in the distal esophagus. Recommend advancement approximately 7-10 cm. 2. Otherwise stable chest. Electronically Signed   By: Davina Poke D.O.   On: 04/25/2021 15:38   DG Chest Port 1 View  Result Date: 04/23/2021 CLINICAL DATA:  Acute respiratory distress.  Shock. EXAM: PORTABLE CHEST 1 VIEW COMPARISON:  04/22/2021 FINDINGS: The endotracheal tube tip is 3 cm above the carina. Nasogastric tube enters the stomach. Atherosclerotic calcification of the aortic arch. Left IJ central venous catheter tip: Cavoatrial junction. No appreciable pneumothorax. Thoracic spondylosis. Upper zone pulmonary vascular prominence may be due to supine positioning. No discrete airspace opacity is identified. Mild central airway thickening. Borderline enlargement of the cardiopericardial silhouette. IMPRESSION: 1. Airway thickening is present, suggesting bronchitis or reactive  airways disease. 2.  Aortic Atherosclerosis (ICD10-I70.0). 3. A new left IJ central venous catheter is present with tip at the cavoatrial junction. Tubes and lines appear satisfactorily position. Electronically Signed   By: Van Clines M.D.   On: 04/23/2021 14:31   DG Chest Port 1 View  Result Date: 04/22/2021 CLINICAL DATA:  Respiratory distress, hypoxia EXAM: PORTABLE CHEST 1 VIEW COMPARISON:  04/21/2021 FINDINGS: No significant change in rotated AP portable examination. Support apparatus including endotracheal tube and esophagogastric tube appropriately positioned. Cardiomegaly. Mild, diffuse interstitial opacity, unchanged. IMPRESSION: 1. No significant change in rotated AP portable examination. 2. Support apparatus including endotracheal tube and esophagogastric tube appropriately positioned. 3. Mild, diffuse bilateral interstitial pulmonary opacity, unchanged, and consistent with edema or infection. 4. Cardiomegaly. Electronically Signed   By: Delanna Ahmadi M.D.   On: 04/22/2021 12:36   ECHOCARDIOGRAM COMPLETE  Result Date: 04/16/2021    ECHOCARDIOGRAM REPORT   Patient Name:   JERZEY KOMPERDA Barabas Date of Exam: 04/16/2021 Medical Rec #:  [id number]     Height:       62.0 in Accession #:    [id number]    Weight:       233.0 lb Date of Birth:  1950-11-19    BSA:          2.040 m Patient Age:    71 years      BP:           127/78 mmHg Patient Gender: F             HR:           78 bpm. Exam Location:  [place] Procedure: 2D Echo, Cardiac Doppler, Color Doppler and Intracardiac            Opacification Agent Indications:    I35.8 Other nonrheumatic aortic valve  disorders  History:        Patient has prior history of Echocardiogram examinations, most                 recent 10/24/2020. CHF, CAD, COPD, Aortic Valve Disease,                 Arrythmias:Bradycardia and Atrial Fibrillation; Risk                 Factors:Diabetes, Hypertension, Sleep Apnea and Current Smoker.  Sonographer:    Pilar Jarvis RDMS, RVT,  RDCS Referring Phys: [place]  1. Left ventricular ejection fraction, by estimation, is 50 to 55%. The left ventricle has low normal function. The left ventricle has no regional wall motion abnormalities. Left ventricular diastolic parameters are consistent with Grade II diastolic dysfunction (pseudonormalization).  2. Right ventricular systolic function is low normal. The right ventricular size is not well visualized.  3. Left atrial size was moderately dilated.  4. The mitral valve is degenerative. No evidence of mitral valve regurgitation. Moderate mitral annular calcification.  5. Aortic valve mean gradient = 58mmHg, peak gradient 41mmHg, Vmax 3.7m/s, DVI= 0.23, AVA 0.7cm2. . The aortic valve is calcified. Aortic valve regurgitation is not visualized. Severe aortic valve stenosis.  6. The inferior vena cava is normal in size with greater than 50% respiratory variability, suggesting right atrial pressure of 3 mmHg. Conclusion(s)/Recommendation(s): Findings consistent with severe valvular heart disease. FINDINGS  Left Ventricle: Left ventricular ejection fraction, by estimation, is 50 to 55%. The left ventricle has low normal function. The left ventricle has no regional wall motion abnormalities. Definity contrast agent was given IV to delineate the left ventricular endocardial borders. The left ventricular internal cavity size was normal in size. There is no left ventricular hypertrophy. Left ventricular diastolic parameters are consistent with Grade II diastolic dysfunction (pseudonormalization). Right Ventricle: The right ventricular size is not well visualized. No increase in right ventricular wall thickness. Right ventricular systolic function is low normal. Left Atrium: Left atrial size was moderately dilated. Right Atrium: Right atrial size was not well visualized. Pericardium: There is no evidence of pericardial effusion. Mitral Valve: The mitral valve is degenerative in  appearance. Moderate mitral annular calcification. No evidence of mitral valve regurgitation. Tricuspid Valve: The tricuspid valve is not well visualized. Tricuspid valve regurgitation is not demonstrated. Aortic Valve: Aortic valve mean gradient = 50mmHg, peak gradient 12mmHg, Vmax 3.45m/s, DVI= 0.23, AVA 0.7cm2. The aortic valve is calcified. Aortic valve regurgitation is not visualized. Severe aortic stenosis is present. Aortic valve mean gradient measures 37.0 mmHg. Aortic valve peak gradient measures 56.9 mmHg. Aortic valve area, by VTI measures 0.83 cm. Pulmonic Valve: The pulmonic valve was not well visualized. Pulmonic valve regurgitation is not visualized. Aorta: The aortic root is normal in size and structure. Venous: The inferior vena cava is normal in size with greater than 50% respiratory variability, suggesting right atrial pressure of 3 mmHg. IAS/Shunts: The interatrial septum was not well visualized.  LEFT VENTRICLE PLAX 2D LVIDd:         5.00 cm   Diastology LVIDs:         3.60 cm   LV e' medial:    7.07 cm/s LV PW:         1.00 cm   LV E/e' medial:  19.7 LV IVS:        0.90 cm   LV e' lateral:   6.31 cm/s LVOT diam:  2.00 cm   LV E/e' lateral: 22.0 LV SV:         76 LV SV Index:   37 LVOT Area:     3.14 cm  IVC IVC diam: 1.70 cm LEFT ATRIUM              Index LA diam:        4.10 cm  2.01 cm/m LA Vol (A2C):   120.0 ml 58.83 ml/m LA Vol (A4C):   95.6 ml  46.87 ml/m LA Biplane Vol: 112.0 ml 54.91 ml/m  AORTIC VALVE                     PULMONIC VALVE AV Area (Vmax):    0.82 cm      PV Vmax:       1.19 m/s AV Area (Vmean):   0.78 cm      PV Peak grad:  5.7 mmHg AV Area (VTI):     0.83 cm AV Vmax:           377.00 cm/s AV Vmean:          257.000 cm/s AV VTI:            0.916 m AV Peak Grad:      56.9 mmHg AV Mean Grad:      37.0 mmHg LVOT Vmax:         97.90 cm/s LVOT Vmean:        63.800 cm/s LVOT VTI:          0.242 m LVOT/AV VTI ratio: 0.26  AORTA Ao Root diam: 2.70 cm MITRAL VALVE                 TRICUSPID VALVE MV Area (PHT): 3.12 cm     TR Peak grad:   32.3 mmHg MV Decel Time: 243 msec     TR Vmax:        284.00 cm/s MV E velocity: 139.00 cm/s MV A velocity: 114.00 cm/s  SHUNTS MV E/A ratio:  1.22         Systemic VTI:  0.24 m                             Systemic Diam: 2.00 cm Kate Sable MD Electronically signed by Kate Sable MD Signature Date/Time: 04/16/2021/5:10:46 PM    Final     Microbiology Recent Results (from the past 240 hour(s))  Culture, Respiratory w Gram Stain     Status: None   Collection Time: 04/21/21 12:04 PM   Specimen: Tracheal Aspirate; Respiratory  Result Value Ref Range Status   Specimen Description   Final    TRACHEAL ASPIRATE Performed at [facility], [address] [place], [place] [address]    Special Requests   Final    NONE Performed at [facility], [address] [place], [place] [address]    Gram Stain   Final    FEW WBC PRESENT, PREDOMINANTLY MONONUCLEAR RARE GRAM NEGATIVE RODS RARE GRAM POSITIVE RODS    Culture   Final    NO GROWTH 2 DAYS Performed at [facility], [place] [address] [place], [place] [address]    Report Status 04/24/2021 FINAL  Final  MRSA Next Gen by PCR, Nasal     Status: None   Collection Time: 04/22/21  9:49 AM   Specimen: Nasal Mucosa; Nasal Swab  Result Value Ref Range Status  MRSA by PCR Next Gen NOT DETECTED NOT DETECTED Final    Comment: (NOTE) The GeneXpert MRSA Assay (FDA approved for NASAL specimens only), is one component of a comprehensive MRSA colonization surveillance program. It is not intended to diagnose MRSA infection nor to guide or monitor treatment for MRSA infections. Test performance is not FDA approved in patients less than 77 years old. Performed at [facility], [address] [place], [place] [address]     Lab Basic Metabolic Panel: Recent Labs  Lab 04/25/21 [phone] 04/26/21 0416 04/27/21 0457 04/28/21 0505 04/29/21 0507   NA 149* 146* 142 145 145  K 4.7 4.0 3.9 4.0 4.1  CL 104 99 95* 100 99  CO2 39* 39* 40* 40* 38*  GLUCOSE 129* 139* 164* 173* 183*  BUN 64* 57* 49* 49* 48*  CREATININE 1.63* 1.52* 1.46* 1.14* 1.23*  CALCIUM 9.3 8.8* 8.9 8.8* 8.8*  MG 2.3 1.8 2.4 2.1 2.4  PHOS 5.4* 4.6 4.4 3.3 3.4   Liver Function Tests: No results for input(s): AST, ALT, ALKPHOS, BILITOT, PROT, ALBUMIN in the last 168 hours. No results for input(s): LIPASE, AMYLASE in the last 168 hours. No results for input(s): AMMONIA in the last 168 hours. CBC: Recent Labs  Lab 04/25/21 0452 04/26/21 0416 04/27/21 0457 04/28/21 0505 04/29/21 0507  WBC 13.3* 13.6* 16.9* 14.0* 15.5*  NEUTROABS  --   --   --   --  12.0*  HGB 9.2* 9.1* 9.6* 8.7* 8.8*  HCT 30.8* 30.6* 31.5* 29.1* 29.2*  MCV 93.1 91.6 91.0 91.8 91.3  PLT 248 247 223 203 236   Cardiac Enzymes: No results for input(s): CKTOTAL, CKMB, CKMBINDEX, TROPONINI in the last 168 hours. Sepsis Labs: Recent Labs  Lab 04/25/21 0911 04/26/21 0416 04/27/21 0457 04/28/21 0505 04/29/21 0507  WBC  --  13.6* 16.9* 14.0* 15.5*  LATICACIDVEN 0.7  --   --   --   --     Procedures/Operations   12/4:  Intubation >> 12/5 extubation 12/5:  reintubation 12/8: CVC and arterial line placed    Rufina Falco, DNP, CCRN, FNP-C, AGACNP-BC Acute Care Nurse Practitioner  [facility] Pager: [phone] [facility]

## 2021-05-17 DEATH — deceased

## 2021-06-03 ENCOUNTER — Ambulatory Visit: Payer: 59 | Admitting: Cardiovascular Disease

## 2021-11-18 IMAGING — DX DG CHEST 1V PORT
1 series · 1 of 1 positions shown · non-contrast
Comparison: 09/27/2020

CLINICAL DATA: Short of breath and wheezing

EXAM:
PORTABLE CHEST 1 VIEW

[chest ap]
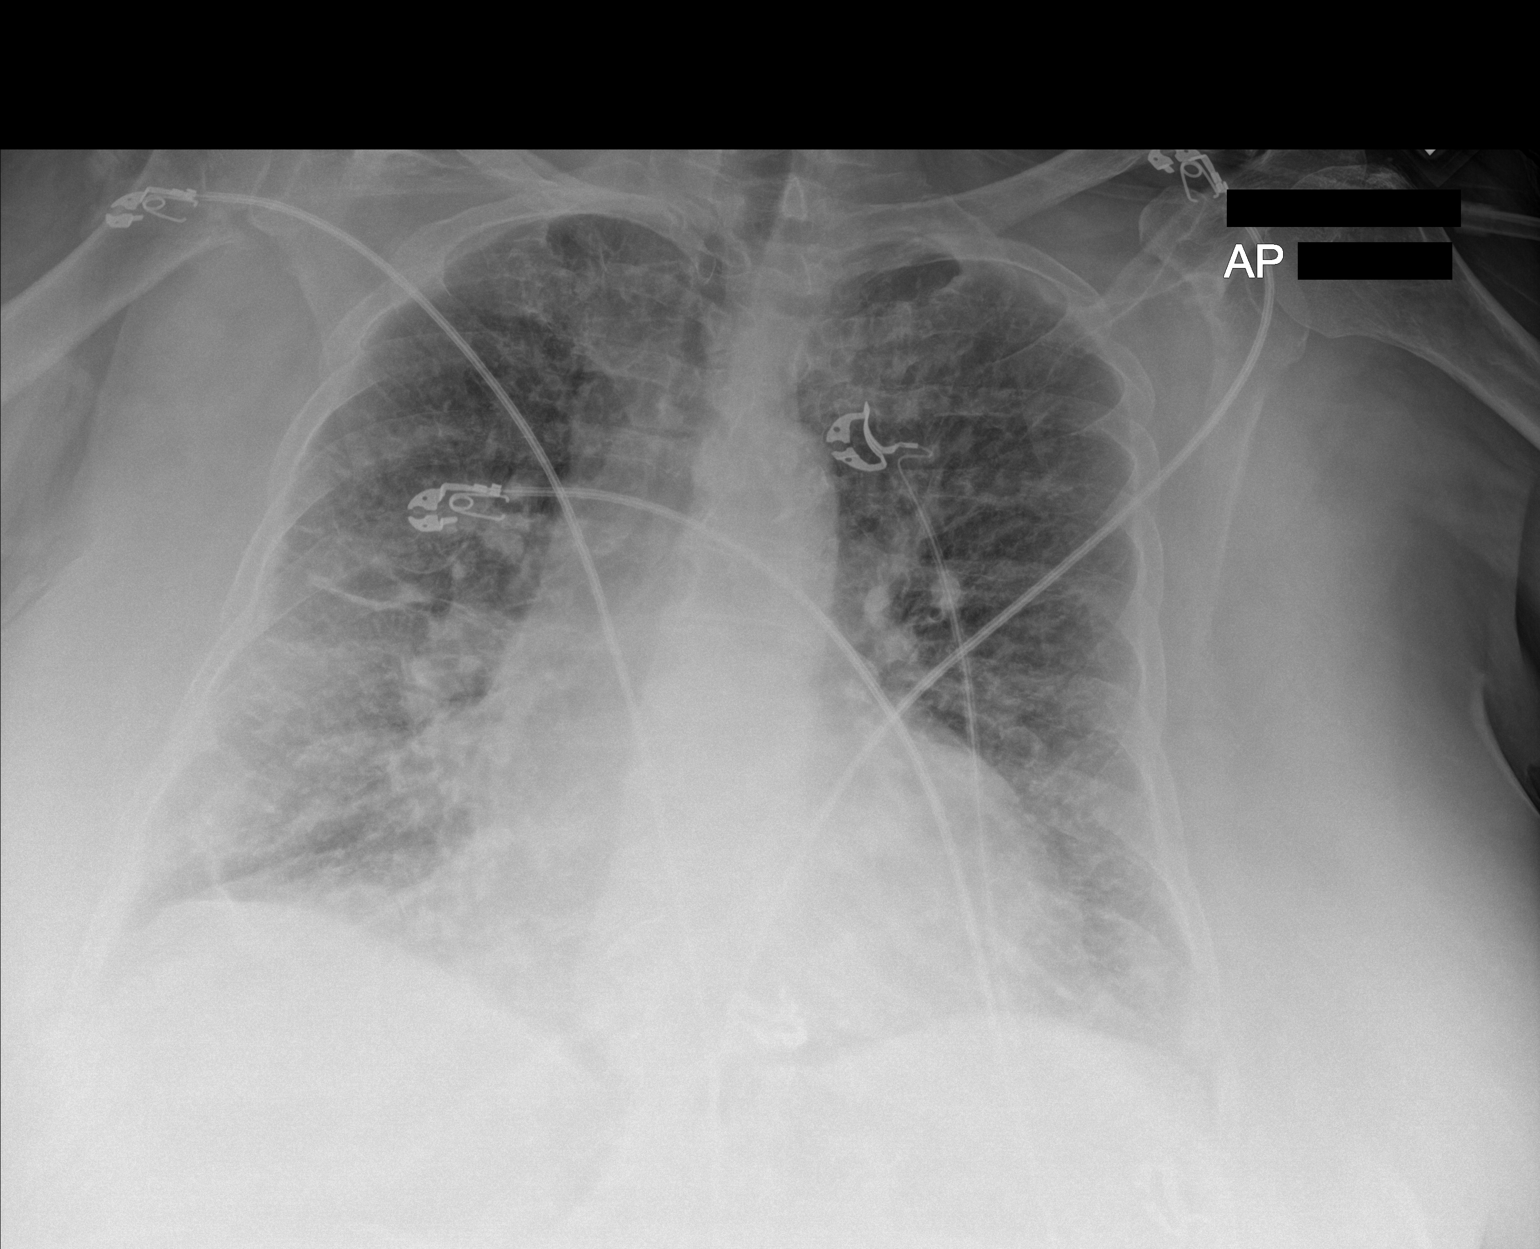

[1 of 1 positions shown; findings below may reference images not displayed]

FINDINGS: Cardiac enlargement with vascular congestion. Improvement in
bilateral airspace disease most likely improvement in edema. No
pleural effusion.
IMPRESSION: Improvement in bilateral airspace disease. Findings most compatible
with improving heart failure and edema.

## 2022-01-11 IMAGING — DX DG CHEST 1V PORT
1 series · 1 of 1 positions shown · non-contrast
Comparison: 10/29/2020

CLINICAL DATA: Respiratory distress, COPD

EXAM:
PORTABLE CHEST 1 VIEW

[chest ap]
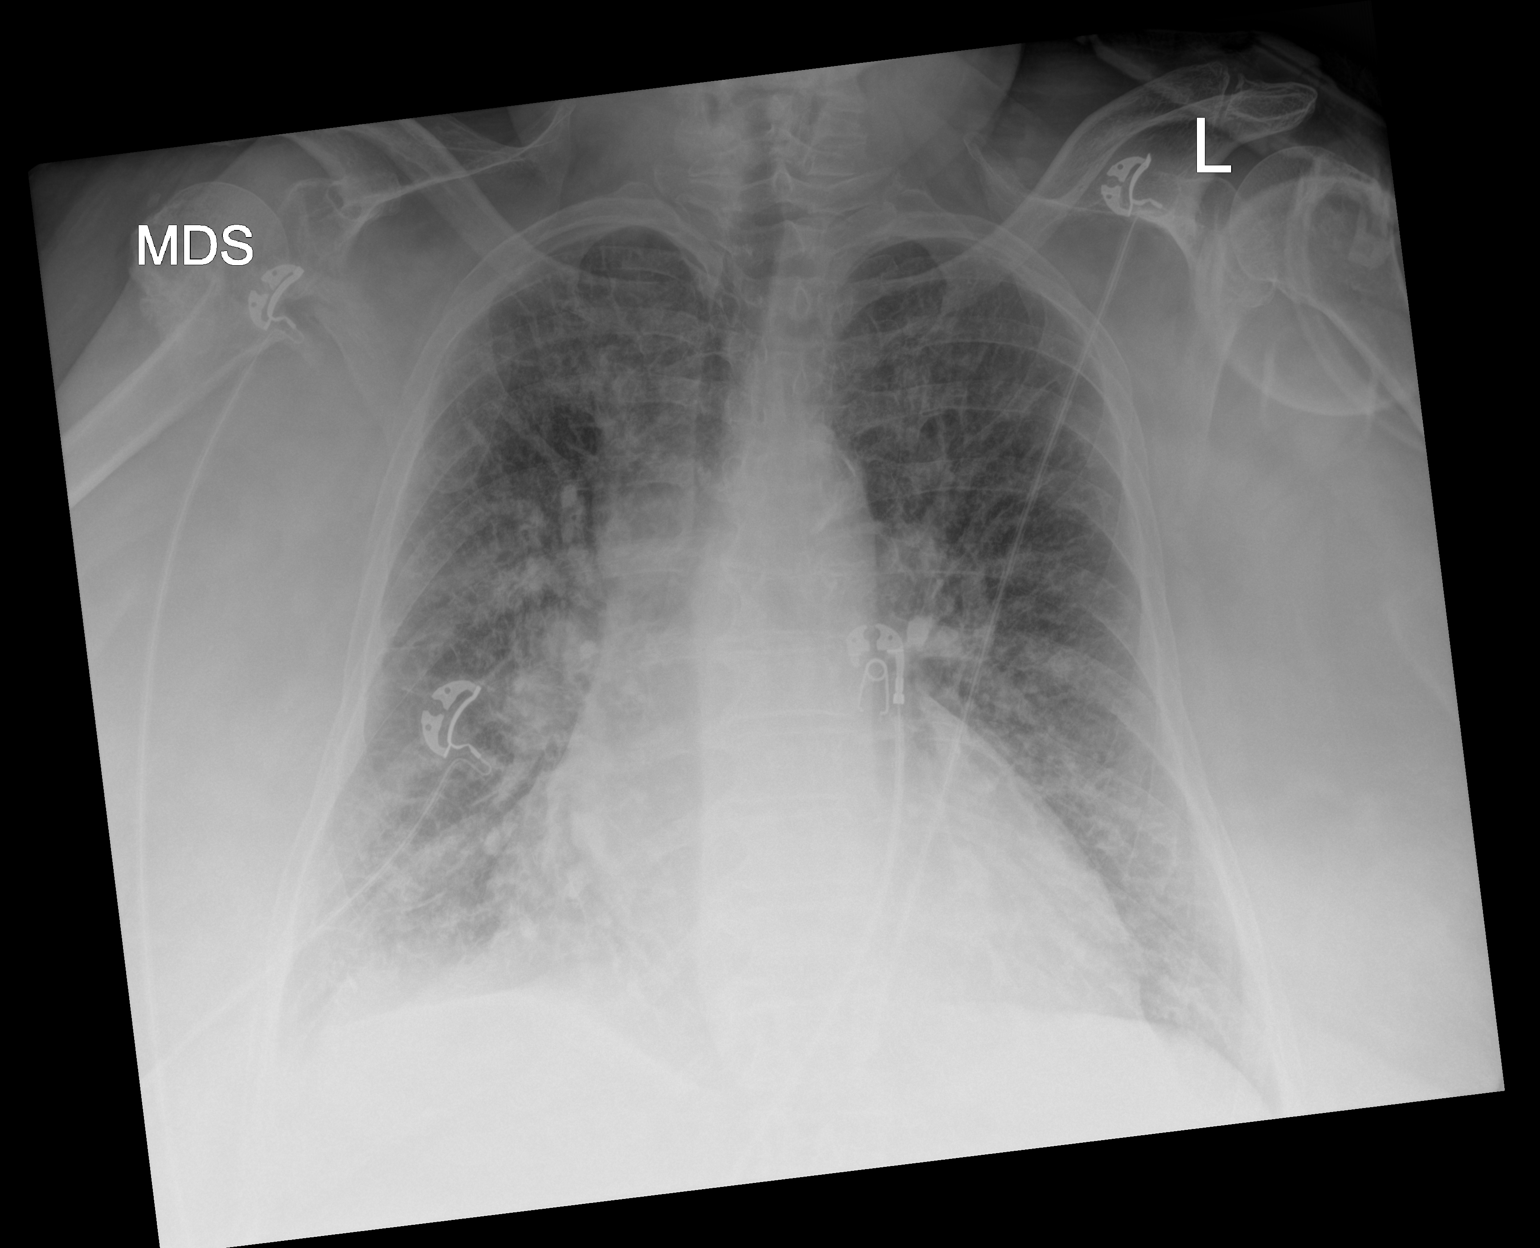

[1 of 1 positions shown; findings below may reference images not displayed]

FINDINGS: Chronic interstitial changes with suspected superimposed edema. No
significant pleural effusion. Stable cardiomediastinal contours. No
pneumothorax.
IMPRESSION: Chronic interstitial changes with suspected superimposed edema.

## 2022-05-15 IMAGING — DX DG CHEST 1V PORT
1 series · 1 of 1 positions shown · non-contrast
Comparison: 04/23/2021

CLINICAL DATA: Dyspnea

EXAM:
PORTABLE CHEST 1 VIEW

[chest ap]
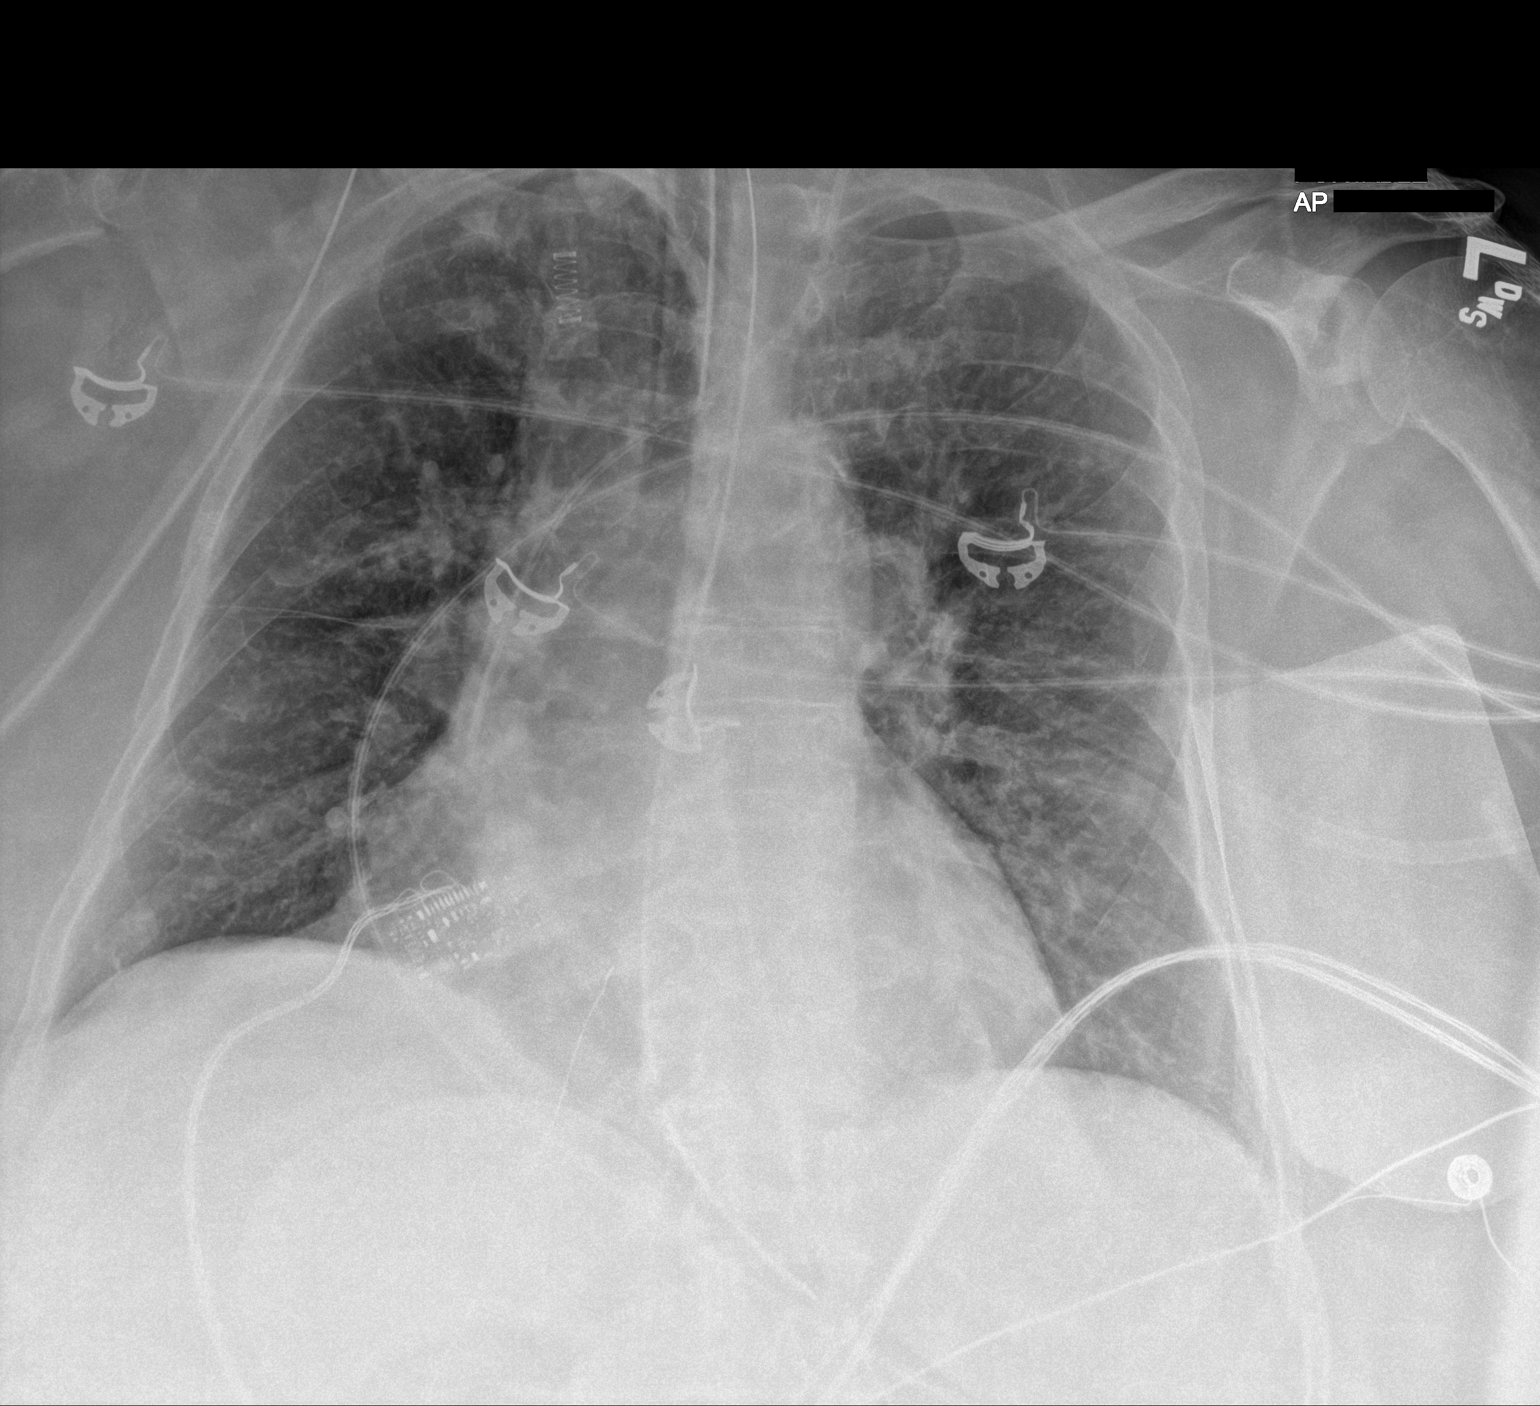

[1 of 1 positions shown; findings below may reference images not displayed]

FINDINGS: ET tube terminates 3.5 cm above the carina. Enteric tube terminates
just below the level of the GE junction with side port in the distal
esophagus. Stable positioning of left IJ central venous catheter.
Stable cardiomegaly. Aortic atherosclerosis. Persistent interstitial
prominence without new focal consolidation. No pleural effusion or
pneumothorax.
IMPRESSION: 1. Enteric tube terminates just below the level of the GE junction
with side port in the distal esophagus. Recommend advancement
approximately 7-10 cm.
2. Otherwise stable chest.

## 2022-05-15 IMAGING — DX DG ABDOMEN 1V
1 series · 2 of 2 positions shown · non-contrast
Comparison: 04/21/2021

CLINICAL DATA: OG tube placement

EXAM:
ABDOMEN - 1 VIEW

[Series 1: abdomen supine · 0.14mm/px · 2 of 2 slices shown]
[im 1/2]
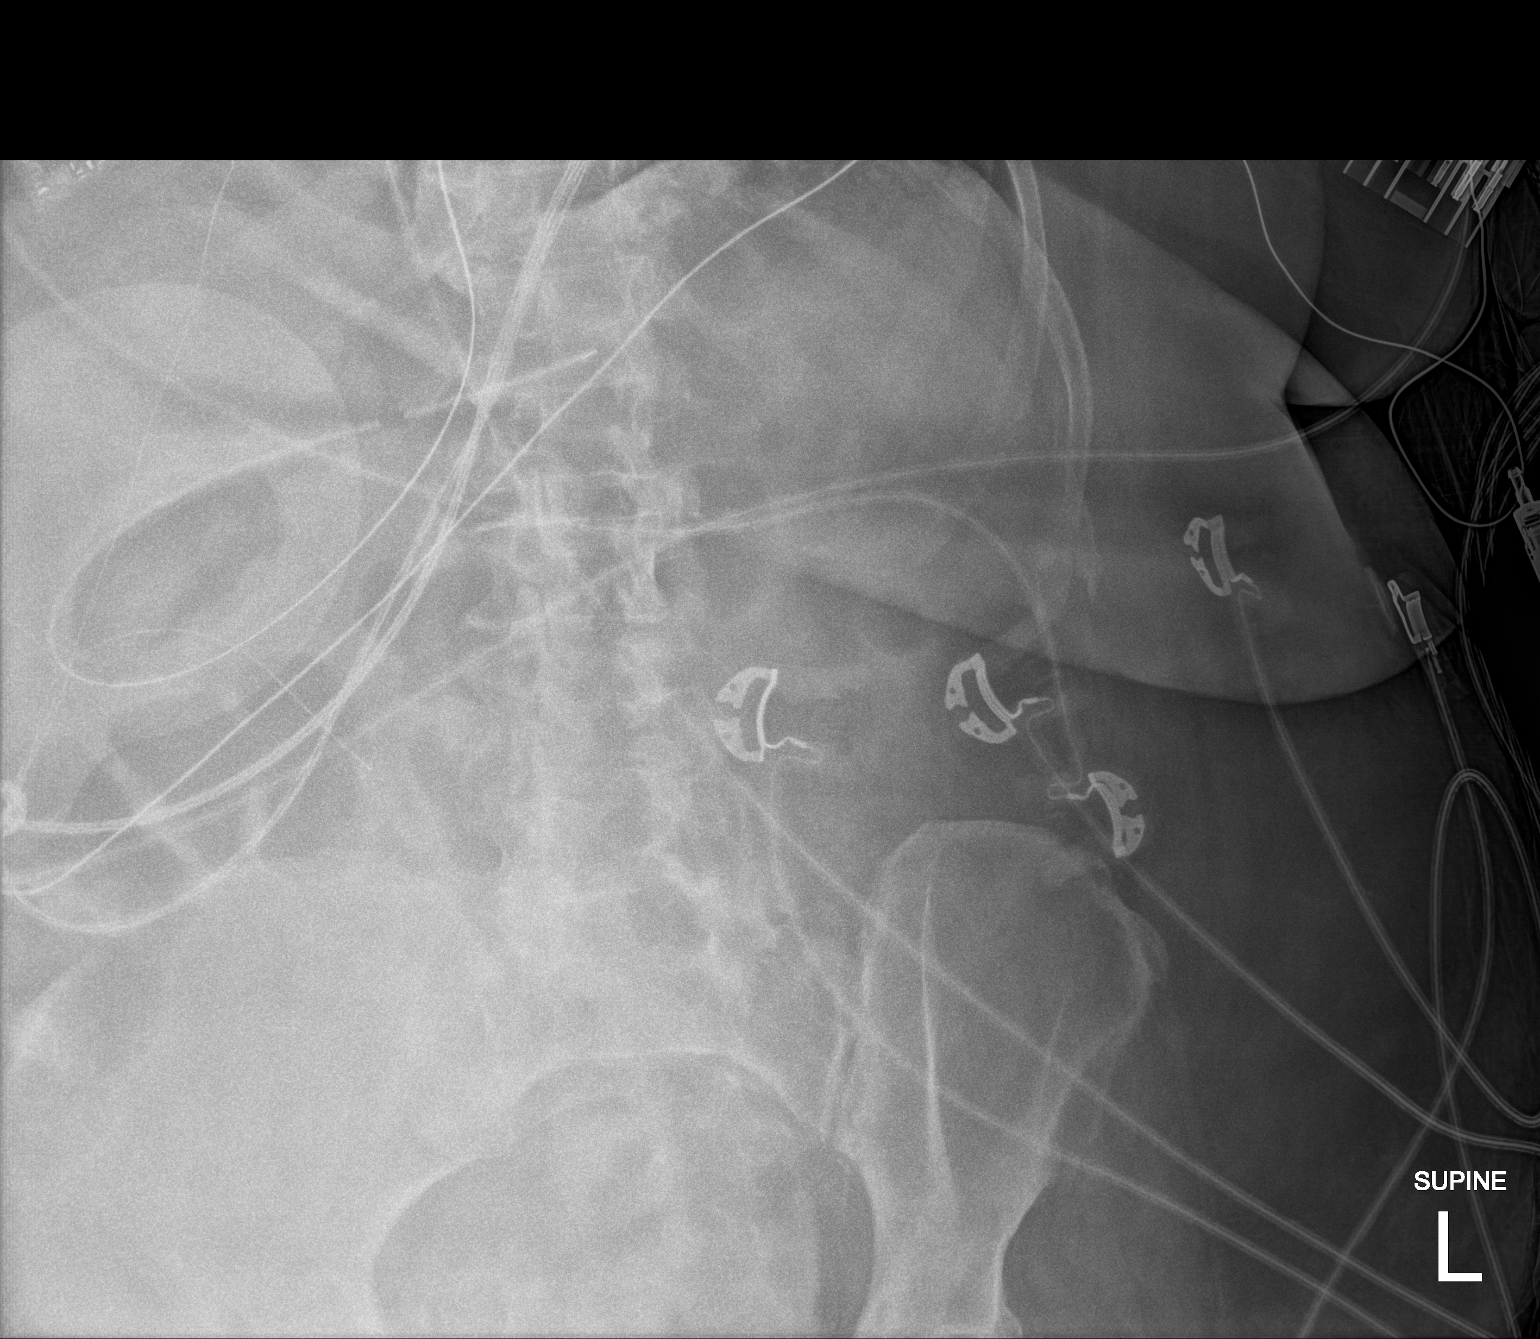
[im 2/2]
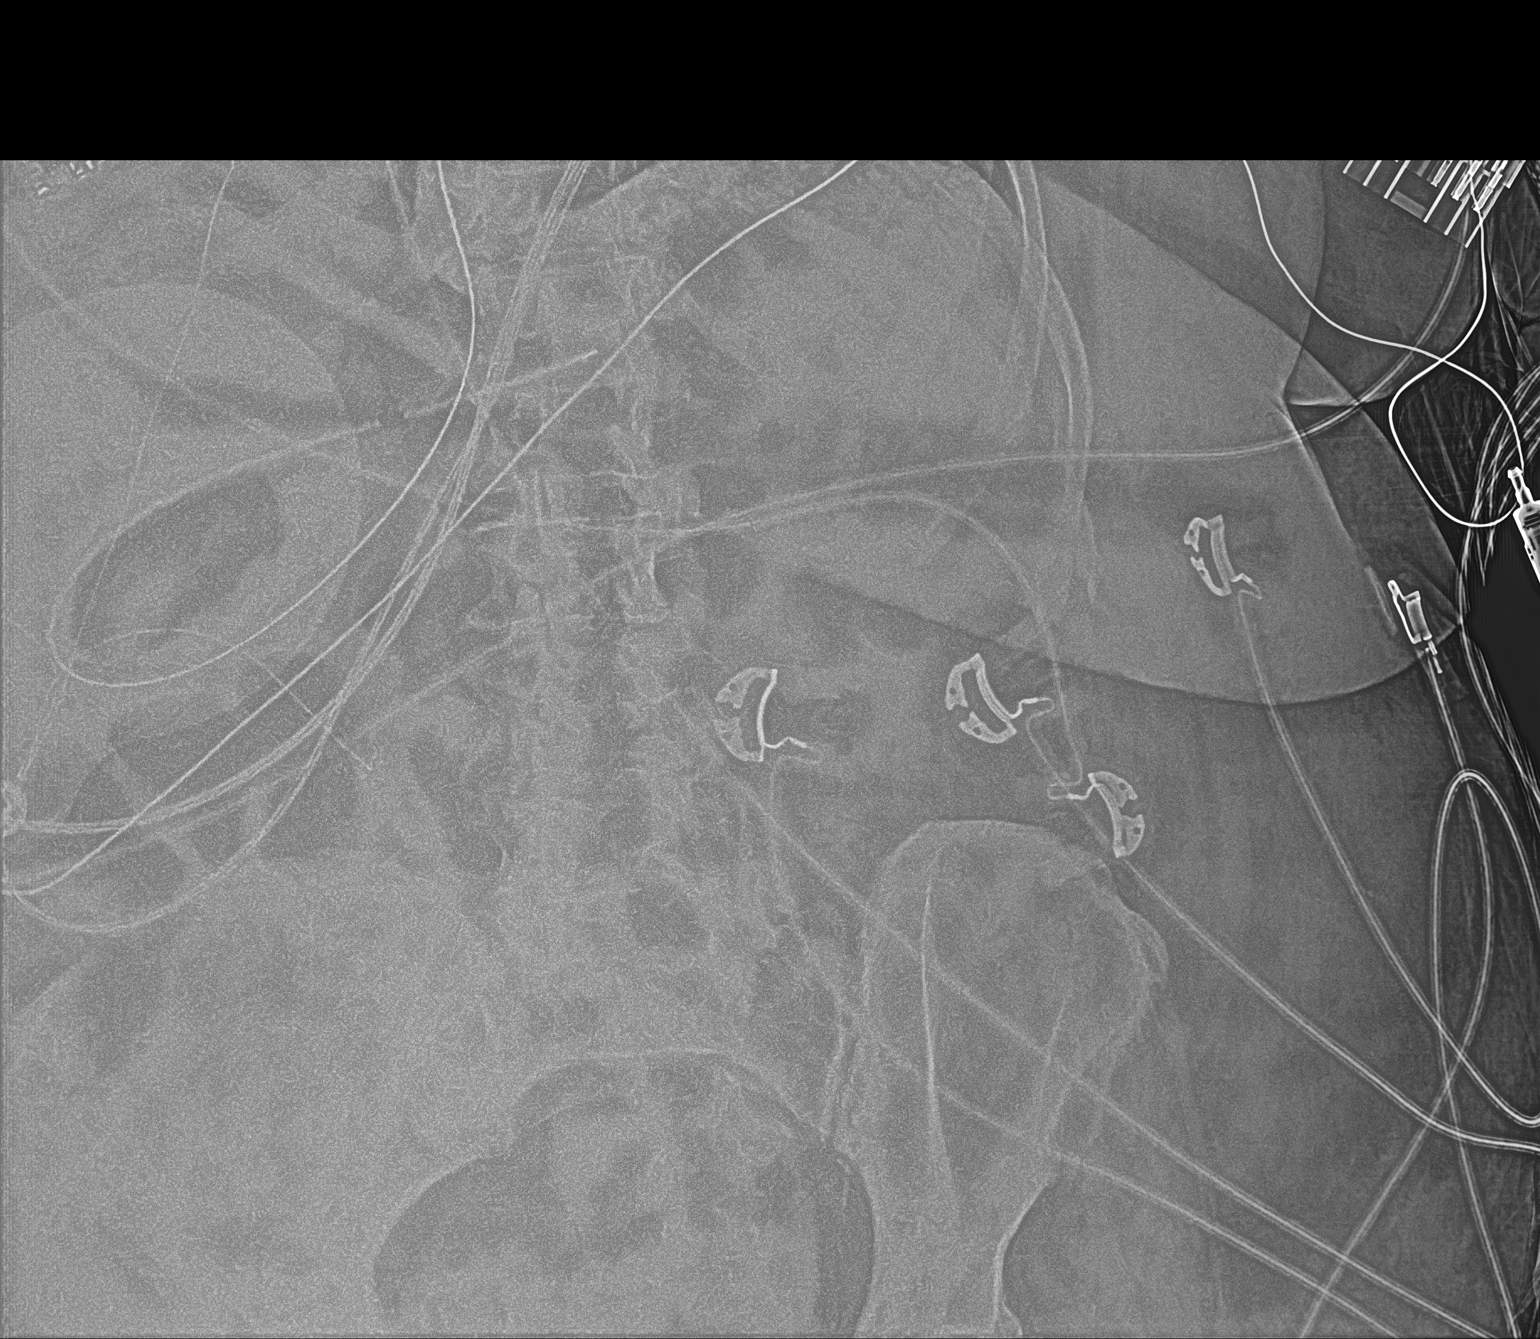

[2 of 2 positions shown; findings below may reference images not displayed]

FINDINGS: OG tube coils in the stomach with the tip in the mid to proximal
stomach. Nonobstructive bowel gas pattern.
IMPRESSION: OG tube in the stomach as above.

## 2022-05-15 IMAGING — DX DG ABDOMEN 1V
1 series · 1 of 1 positions shown · non-contrast
Comparison: 04/25/2021

CLINICAL DATA: Enteric catheter placement

EXAM:
ABDOMEN - 1 VIEW

[abdomen supine]
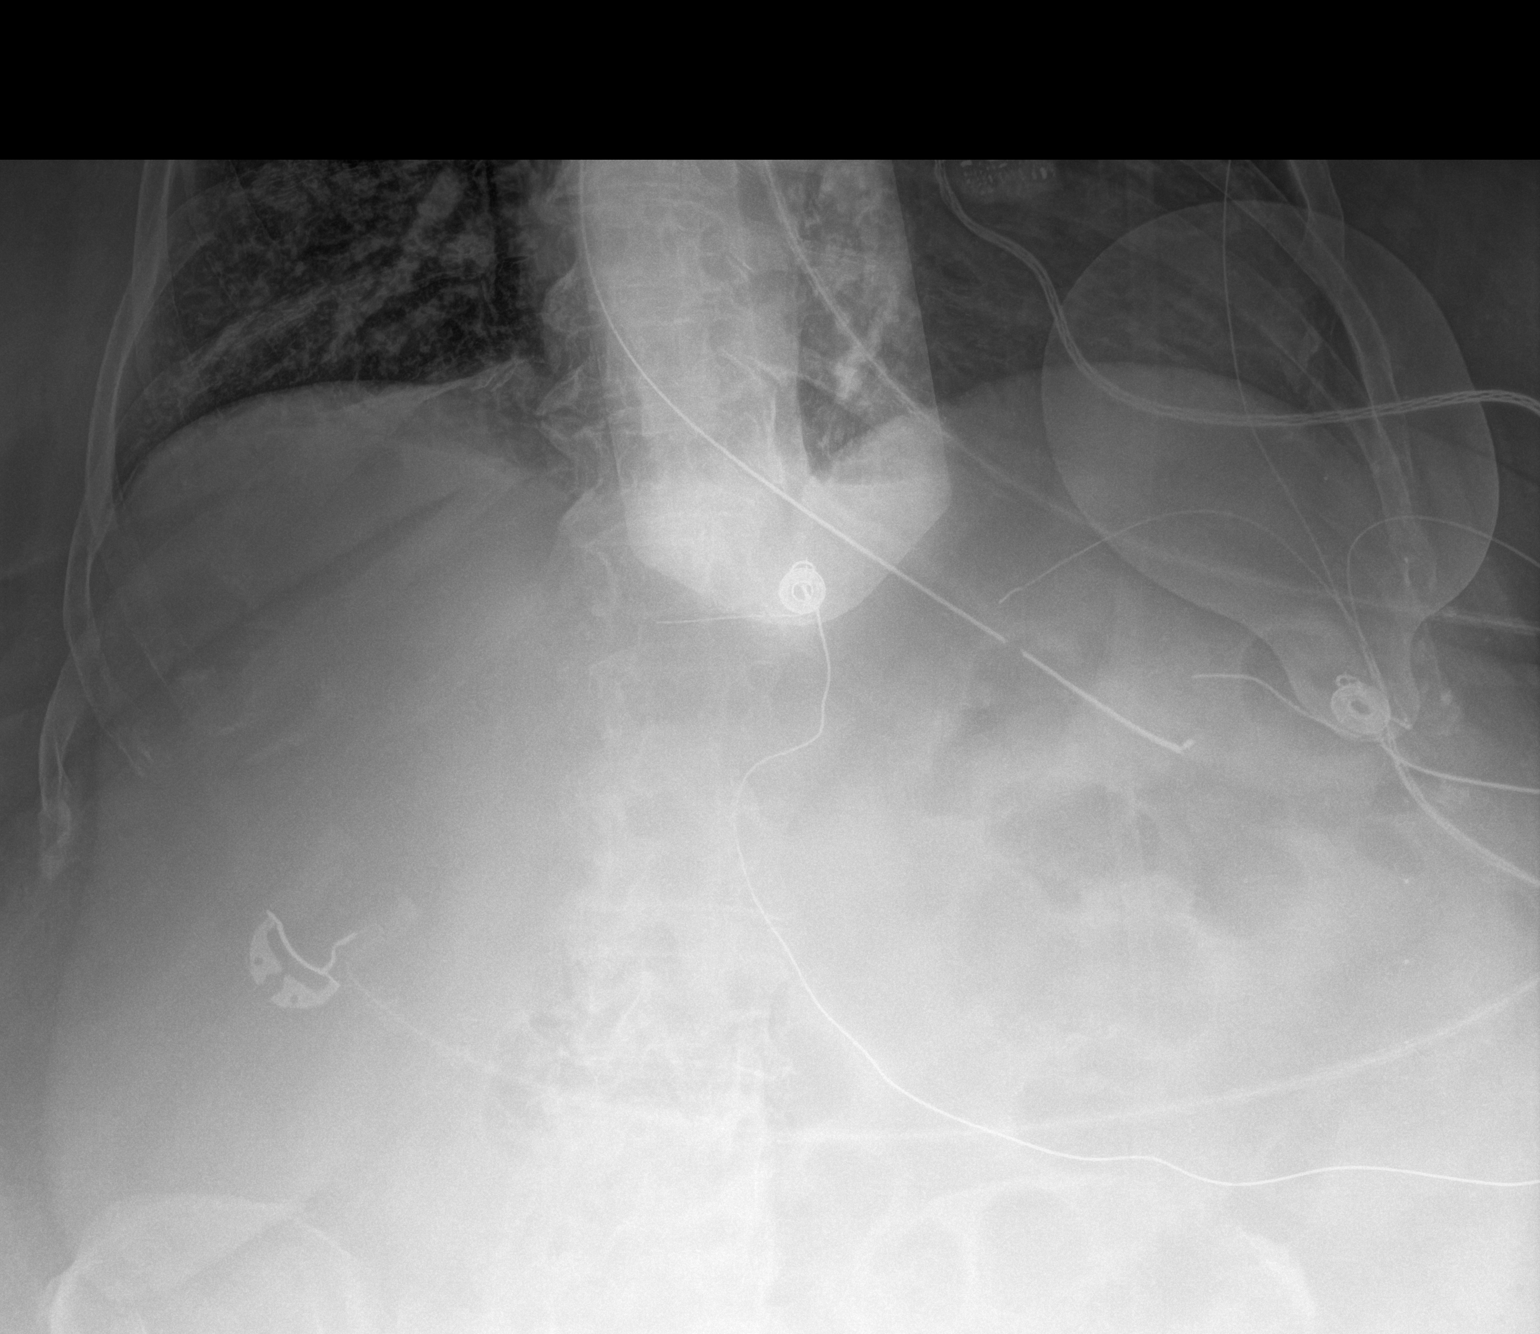

[1 of 1 positions shown; findings below may reference images not displayed]

FINDINGS: Frontal view of the lower chest and upper abdomen demonstrates
enteric catheter passing below diaphragm, tip and side port project
over the gastric fundus. External defibrillator pads overlie the
left chest. Bowel gas pattern is unremarkable.
IMPRESSION: 1. Enteric catheter tip projecting over the gastric fundus.
